# Patient Record
Sex: Female | Born: 1950
Health system: Southern US, Community
[De-identification: ages and names within clinical notes are randomized; demographics above are authoritative.]

## PROBLEM LIST (undated history)

## (undated) DIAGNOSIS — J309 Allergic rhinitis, unspecified: Secondary | ICD-10-CM

## (undated) DIAGNOSIS — E876 Hypokalemia: Secondary | ICD-10-CM

## (undated) DIAGNOSIS — M25569 Pain in unspecified knee: Secondary | ICD-10-CM

## (undated) DIAGNOSIS — K642 Third degree hemorrhoids: Secondary | ICD-10-CM

## (undated) DIAGNOSIS — K589 Irritable bowel syndrome without diarrhea: Secondary | ICD-10-CM

## (undated) DIAGNOSIS — Z8049 Family history of malignant neoplasm of other genital organs: Secondary | ICD-10-CM

## (undated) DIAGNOSIS — M5000 Cervical disc disorder with myelopathy, unspecified cervical region: Secondary | ICD-10-CM

## (undated) DIAGNOSIS — B001 Herpesviral vesicular dermatitis: Secondary | ICD-10-CM

## (undated) DIAGNOSIS — M19049 Primary osteoarthritis, unspecified hand: Secondary | ICD-10-CM

## (undated) DIAGNOSIS — K929 Disease of digestive system, unspecified: Secondary | ICD-10-CM

## (undated) DIAGNOSIS — E039 Hypothyroidism, unspecified: Secondary | ICD-10-CM

## (undated) DIAGNOSIS — M7730 Calcaneal spur, unspecified foot: Secondary | ICD-10-CM

## (undated) DIAGNOSIS — E119 Type 2 diabetes mellitus without complications: Secondary | ICD-10-CM

## (undated) DIAGNOSIS — K660 Peritoneal adhesions (postprocedural) (postinfection): Secondary | ICD-10-CM

## (undated) DIAGNOSIS — Z79899 Other long term (current) drug therapy: Secondary | ICD-10-CM

## (undated) DIAGNOSIS — I10 Essential (primary) hypertension: Secondary | ICD-10-CM

## (undated) DIAGNOSIS — J449 Chronic obstructive pulmonary disease, unspecified: Secondary | ICD-10-CM

## (undated) DIAGNOSIS — Z800 Family history of malignant neoplasm of digestive organs: Secondary | ICD-10-CM

## (undated) DIAGNOSIS — K219 Gastro-esophageal reflux disease without esophagitis: Secondary | ICD-10-CM

## (undated) DIAGNOSIS — M479 Spondylosis, unspecified: Secondary | ICD-10-CM

## (undated) DIAGNOSIS — K601 Chronic anal fissure: Secondary | ICD-10-CM

## (undated) DIAGNOSIS — M7138 Other bursal cyst, other site: Secondary | ICD-10-CM

## (undated) DIAGNOSIS — K760 Fatty (change of) liver, not elsewhere classified: Secondary | ICD-10-CM

## (undated) DIAGNOSIS — Z801 Family history of malignant neoplasm of trachea, bronchus and lung: Secondary | ICD-10-CM

## (undated) DIAGNOSIS — E559 Vitamin D deficiency, unspecified: Secondary | ICD-10-CM

## (undated) DIAGNOSIS — F319 Bipolar disorder, unspecified: Secondary | ICD-10-CM

## (undated) DIAGNOSIS — E669 Obesity, unspecified: Secondary | ICD-10-CM

## (undated) DIAGNOSIS — D126 Benign neoplasm of colon, unspecified: Secondary | ICD-10-CM

## (undated) DIAGNOSIS — G5600 Carpal tunnel syndrome, unspecified upper limb: Secondary | ICD-10-CM

## (undated) DIAGNOSIS — I2510 Atherosclerotic heart disease of native coronary artery without angina pectoris: Secondary | ICD-10-CM

## (undated) DIAGNOSIS — E7800 Pure hypercholesterolemia, unspecified: Secondary | ICD-10-CM

## (undated) DIAGNOSIS — R7303 Prediabetes: Secondary | ICD-10-CM

## (undated) DIAGNOSIS — G2581 Restless legs syndrome: Secondary | ICD-10-CM

## (undated) DIAGNOSIS — M797 Fibromyalgia: Secondary | ICD-10-CM

## (undated) DIAGNOSIS — R911 Solitary pulmonary nodule: Secondary | ICD-10-CM

## (undated) DIAGNOSIS — M169 Osteoarthritis of hip, unspecified: Secondary | ICD-10-CM

## (undated) DIAGNOSIS — G894 Chronic pain syndrome: Secondary | ICD-10-CM

## (undated) DIAGNOSIS — N393 Stress incontinence (female) (male): Secondary | ICD-10-CM

## (undated) DIAGNOSIS — L508 Other urticaria: Secondary | ICD-10-CM

## (undated) DIAGNOSIS — H269 Unspecified cataract: Secondary | ICD-10-CM

## (undated) DIAGNOSIS — J189 Pneumonia, unspecified organism: Secondary | ICD-10-CM

## (undated) DIAGNOSIS — E279 Disorder of adrenal gland, unspecified: Secondary | ICD-10-CM

## (undated) DIAGNOSIS — F419 Anxiety disorder, unspecified: Secondary | ICD-10-CM

## (undated) DIAGNOSIS — L719 Rosacea, unspecified: Secondary | ICD-10-CM

## (undated) DIAGNOSIS — Z803 Family history of malignant neoplasm of breast: Secondary | ICD-10-CM

## (undated) DIAGNOSIS — E781 Pure hyperglyceridemia: Secondary | ICD-10-CM

## (undated) DIAGNOSIS — Z8614 Personal history of Methicillin resistant Staphylococcus aureus infection: Secondary | ICD-10-CM

## (undated) DIAGNOSIS — K649 Unspecified hemorrhoids: Secondary | ICD-10-CM

## (undated) DIAGNOSIS — D649 Anemia, unspecified: Secondary | ICD-10-CM

## (undated) DIAGNOSIS — Z5689 Other problems related to employment: Secondary | ICD-10-CM

## (undated) DIAGNOSIS — Z8481 Family history of carrier of genetic disease: Secondary | ICD-10-CM

## (undated) DIAGNOSIS — E739 Lactose intolerance, unspecified: Secondary | ICD-10-CM

## (undated) DIAGNOSIS — M65312 Trigger thumb, left thumb: Secondary | ICD-10-CM

## (undated) DIAGNOSIS — J439 Emphysema, unspecified: Secondary | ICD-10-CM

## (undated) DIAGNOSIS — F339 Major depressive disorder, recurrent, unspecified: Secondary | ICD-10-CM

## (undated) DIAGNOSIS — G4761 Periodic limb movement disorder: Secondary | ICD-10-CM

## (undated) DIAGNOSIS — T7840XA Allergy, unspecified, initial encounter: Secondary | ICD-10-CM

## (undated) DIAGNOSIS — K644 Residual hemorrhoidal skin tags: Secondary | ICD-10-CM

## (undated) HISTORY — DX: Herpesviral vesicular dermatitis: B00.1

## (undated) HISTORY — PX: CARDIAC CATHETERIZATION: SHX172

## (undated) HISTORY — DX: Morbid (severe) obesity due to excess calories: E66.01

## (undated) HISTORY — DX: Fatty (change of) liver, not elsewhere classified: K76.0

## (undated) HISTORY — DX: Type 2 diabetes mellitus without complications: E11.9

## (undated) HISTORY — DX: Hypothyroidism, unspecified: E03.9

## (undated) HISTORY — DX: Restless legs syndrome: G25.81

## (undated) HISTORY — DX: Residual hemorrhoidal skin tags: K64.4

## (undated) HISTORY — PX: LAPAROSCOPY FOR ECTOPIC PREGNANCY: SUR765

## (undated) HISTORY — DX: Pain in unspecified knee: M25.569

## (undated) HISTORY — DX: Peritoneal adhesions (postprocedural) (postinfection): K66.0

## (undated) HISTORY — DX: Allergy, unspecified, initial encounter: T78.40XA

## (undated) HISTORY — PX: EXPLORATORY LAPAROTOMY: SUR591

## (undated) HISTORY — DX: Essential (primary) hypertension: I10

## (undated) HISTORY — DX: Chronic obstructive pulmonary disease, unspecified: J44.9

## (undated) HISTORY — DX: Gastro-esophageal reflux disease without esophagitis: K21.9

## (undated) HISTORY — PX: FACIAL LACERATIONS REPAIR: SHX1571

## (undated) HISTORY — DX: Pure hyperglyceridemia: E78.1

## (undated) HISTORY — DX: Fibromyalgia: M79.7

## (undated) HISTORY — DX: Major depressive disorder, recurrent, unspecified: F33.9

## (undated) HISTORY — DX: Third degree hemorrhoids: K64.2

## (undated) HISTORY — DX: Other urticaria: L50.8

## (undated) HISTORY — DX: Personal history of Methicillin resistant Staphylococcus aureus infection: Z86.14

## (undated) HISTORY — DX: Prediabetes: R73.03

## (undated) HISTORY — DX: Periodic limb movement disorder: G47.61

## (undated) HISTORY — DX: Disease of digestive system, unspecified: K92.9

## (undated) HISTORY — PX: ESOPHAGOGASTRODUODENOSCOPY: SHX1529

## (undated) HISTORY — DX: Primary osteoarthritis, unspecified hand: M19.049

## (undated) HISTORY — DX: Pure hypercholesterolemia, unspecified: E78.00

## (undated) HISTORY — DX: Irritable bowel syndrome without diarrhea: K58.9

## (undated) HISTORY — DX: Other bursal cyst, other site: M71.38

## (undated) HISTORY — DX: Disorder of adrenal gland, unspecified: E27.9

## (undated) HISTORY — DX: Obesity, unspecified: E66.9

## (undated) HISTORY — DX: Solitary pulmonary nodule: R91.1

## (undated) HISTORY — DX: Unspecified hemorrhoids: K64.9

## (undated) HISTORY — DX: Lactose intolerance, unspecified: E73.9

## (undated) HISTORY — DX: Family history of malignant neoplasm of digestive organs: Z80.0

## (undated) HISTORY — DX: Family history of malignant neoplasm of other genital organs: Z80.49

## (undated) HISTORY — DX: Hypokalemia: E87.6

## (undated) HISTORY — DX: Calcaneal spur, unspecified foot: M77.30

## (undated) HISTORY — DX: Allergic rhinitis, unspecified: J30.9

## (undated) HISTORY — DX: Chronic anal fissure: K60.1

## (undated) HISTORY — DX: Family history of malignant neoplasm of trachea, bronchus and lung: Z80.1

## (undated) HISTORY — PX: UPPER GASTROINTESTINAL ENDOSCOPY: SHX188

## (undated) HISTORY — DX: Emphysema, unspecified: J43.9

## (undated) HISTORY — DX: Carpal tunnel syndrome, unspecified upper limb: G56.00

## (undated) HISTORY — PX: APPENDECTOMY: SHX54

## (undated) HISTORY — PX: EXAM ANORECTAL W/ ULTRASOUND: SUR468

## (undated) HISTORY — DX: Family history of malignant neoplasm of breast: Z80.3

## (undated) HISTORY — DX: Benign neoplasm of colon, unspecified: D12.6

## (undated) HISTORY — DX: Rosacea, unspecified: L71.9

## (undated) HISTORY — DX: Cervical disc disorder with myelopathy, unspecified cervical region: M50.00

## (undated) HISTORY — DX: Chronic pain syndrome: G89.4

## (undated) HISTORY — DX: Osteoarthritis of hip, unspecified: M16.9

## (undated) HISTORY — DX: Family history of carrier of genetic disease: Z84.81

## (undated) HISTORY — DX: Vitamin D deficiency, unspecified: E55.9

## (undated) HISTORY — DX: Spondylosis, unspecified: M47.9

---
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
## 1990-11-06 HISTORY — PX: BUNIONECTOMY: SHX129

## 1996-11-06 HISTORY — PX: OTHER SURGICAL HISTORY: SHX169

## 1996-11-06 HISTORY — PX: CYSTOCELE REPAIR: SHX163

## 1996-11-06 HISTORY — PX: RECTOCELE REPAIR: SHX761

## 1998-05-26 ENCOUNTER — Ambulatory Visit (HOSPITAL_COMMUNITY): Admission: RE | Admit: 1998-05-26 | Discharge: 1998-05-26 | Payer: Self-pay | Admitting: Gastroenterology

## 1998-06-08 ENCOUNTER — Other Ambulatory Visit: Admission: RE | Admit: 1998-06-08 | Discharge: 1998-06-08 | Payer: Self-pay | Admitting: Obstetrics and Gynecology

## 1999-07-14 ENCOUNTER — Encounter: Admission: RE | Admit: 1999-07-14 | Discharge: 1999-07-14 | Payer: Self-pay | Admitting: Family Medicine

## 1999-07-27 ENCOUNTER — Encounter: Admission: RE | Admit: 1999-07-27 | Discharge: 1999-07-27 | Payer: Self-pay | Admitting: Family Medicine

## 1999-08-01 ENCOUNTER — Encounter: Admission: RE | Admit: 1999-08-01 | Discharge: 1999-08-01 | Payer: Self-pay | Admitting: Family Medicine

## 1999-11-17 ENCOUNTER — Encounter: Admission: RE | Admit: 1999-11-17 | Discharge: 1999-11-17 | Payer: Self-pay | Admitting: Family Medicine

## 1999-11-25 ENCOUNTER — Encounter: Admission: RE | Admit: 1999-11-25 | Discharge: 1999-11-25 | Payer: Self-pay | Admitting: Family Medicine

## 2000-01-31 ENCOUNTER — Other Ambulatory Visit: Admission: RE | Admit: 2000-01-31 | Discharge: 2000-01-31 | Payer: Self-pay | Admitting: Obstetrics and Gynecology

## 2000-03-16 ENCOUNTER — Encounter: Admission: RE | Admit: 2000-03-16 | Discharge: 2000-03-16 | Payer: Self-pay | Admitting: Family Medicine

## 2000-05-25 ENCOUNTER — Encounter: Admission: RE | Admit: 2000-05-25 | Discharge: 2000-05-25 | Payer: Self-pay | Admitting: Family Medicine

## 2000-05-28 ENCOUNTER — Encounter: Admission: RE | Admit: 2000-05-28 | Discharge: 2000-05-28 | Payer: Self-pay | Admitting: *Deleted

## 2000-05-28 ENCOUNTER — Encounter: Admission: RE | Admit: 2000-05-28 | Discharge: 2000-05-28 | Payer: Self-pay | Admitting: Family Medicine

## 2000-06-05 ENCOUNTER — Encounter: Admission: RE | Admit: 2000-06-05 | Discharge: 2000-06-05 | Payer: Self-pay | Admitting: Family Medicine

## 2000-09-04 ENCOUNTER — Encounter: Admission: RE | Admit: 2000-09-04 | Discharge: 2000-09-04 | Payer: Self-pay | Admitting: Family Medicine

## 2000-09-20 ENCOUNTER — Encounter: Admission: RE | Admit: 2000-09-20 | Discharge: 2000-09-20 | Payer: Self-pay | Admitting: Family Medicine

## 2001-03-19 ENCOUNTER — Other Ambulatory Visit: Admission: RE | Admit: 2001-03-19 | Discharge: 2001-03-19 | Payer: Self-pay | Admitting: Obstetrics and Gynecology

## 2001-03-28 ENCOUNTER — Encounter: Admission: RE | Admit: 2001-03-28 | Discharge: 2001-03-28 | Payer: Self-pay | Admitting: Family Medicine

## 2001-04-03 ENCOUNTER — Encounter: Admission: RE | Admit: 2001-04-03 | Discharge: 2001-04-03 | Payer: Self-pay | Admitting: Family Medicine

## 2002-03-23 ENCOUNTER — Inpatient Hospital Stay (HOSPITAL_COMMUNITY): Admission: EM | Admit: 2002-03-23 | Discharge: 2002-03-29 | Payer: Self-pay | Admitting: Psychiatry

## 2002-04-01 ENCOUNTER — Other Ambulatory Visit (HOSPITAL_COMMUNITY): Admission: RE | Admit: 2002-04-01 | Discharge: 2002-04-10 | Payer: Self-pay | Admitting: *Deleted

## 2002-04-03 ENCOUNTER — Encounter: Admission: RE | Admit: 2002-04-03 | Discharge: 2002-04-03 | Payer: Self-pay | Admitting: Family Medicine

## 2002-04-03 ENCOUNTER — Ambulatory Visit (HOSPITAL_COMMUNITY): Admission: RE | Admit: 2002-04-03 | Discharge: 2002-04-03 | Payer: Self-pay | Admitting: Family Medicine

## 2002-04-09 ENCOUNTER — Encounter: Admission: RE | Admit: 2002-04-09 | Discharge: 2002-04-09 | Payer: Self-pay | Admitting: Family Medicine

## 2002-04-24 ENCOUNTER — Encounter: Admission: RE | Admit: 2002-04-24 | Discharge: 2002-04-24 | Payer: Self-pay | Admitting: Family Medicine

## 2002-04-30 ENCOUNTER — Encounter: Admission: RE | Admit: 2002-04-30 | Discharge: 2002-04-30 | Payer: Self-pay | Admitting: Family Medicine

## 2002-05-08 ENCOUNTER — Encounter: Admission: RE | Admit: 2002-05-08 | Discharge: 2002-05-08 | Payer: Self-pay | Admitting: Family Medicine

## 2002-06-26 ENCOUNTER — Encounter: Admission: RE | Admit: 2002-06-26 | Discharge: 2002-06-26 | Payer: Self-pay | Admitting: Family Medicine

## 2002-09-11 ENCOUNTER — Encounter: Admission: RE | Admit: 2002-09-11 | Discharge: 2002-09-11 | Payer: Self-pay | Admitting: Family Medicine

## 2002-09-12 ENCOUNTER — Encounter: Admission: RE | Admit: 2002-09-12 | Discharge: 2002-09-12 | Payer: Self-pay | Admitting: Sports Medicine

## 2002-09-12 ENCOUNTER — Encounter: Payer: Self-pay | Admitting: Sports Medicine

## 2002-09-18 ENCOUNTER — Encounter: Admission: RE | Admit: 2002-09-18 | Discharge: 2002-09-18 | Payer: Self-pay | Admitting: Family Medicine

## 2002-10-06 ENCOUNTER — Encounter: Admission: RE | Admit: 2002-10-06 | Discharge: 2002-10-06 | Payer: Self-pay | Admitting: Family Medicine

## 2002-11-06 HISTORY — PX: COLONOSCOPY: SHX174

## 2003-01-07 ENCOUNTER — Ambulatory Visit (HOSPITAL_COMMUNITY): Admission: RE | Admit: 2003-01-07 | Discharge: 2003-01-07 | Payer: Self-pay | Admitting: Gastroenterology

## 2003-05-05 ENCOUNTER — Encounter: Admission: RE | Admit: 2003-05-05 | Discharge: 2003-05-05 | Payer: Self-pay | Admitting: Family Medicine

## 2003-07-02 ENCOUNTER — Encounter: Admission: RE | Admit: 2003-07-02 | Discharge: 2003-07-02 | Payer: Self-pay | Admitting: Family Medicine

## 2003-07-16 ENCOUNTER — Encounter: Admission: RE | Admit: 2003-07-16 | Discharge: 2003-07-16 | Payer: Self-pay | Admitting: Family Medicine

## 2003-07-24 ENCOUNTER — Encounter: Payer: Self-pay | Admitting: Emergency Medicine

## 2003-07-24 ENCOUNTER — Emergency Department (HOSPITAL_COMMUNITY): Admission: EM | Admit: 2003-07-24 | Discharge: 2003-07-24 | Payer: Self-pay | Admitting: Emergency Medicine

## 2003-07-28 ENCOUNTER — Encounter: Admission: RE | Admit: 2003-07-28 | Discharge: 2003-07-28 | Payer: Self-pay | Admitting: Family Medicine

## 2003-09-18 ENCOUNTER — Encounter: Admission: RE | Admit: 2003-09-18 | Discharge: 2003-09-18 | Payer: Self-pay | Admitting: Family Medicine

## 2003-10-05 ENCOUNTER — Encounter: Admission: RE | Admit: 2003-10-05 | Discharge: 2003-10-05 | Payer: Self-pay | Admitting: Family Medicine

## 2003-10-07 ENCOUNTER — Encounter: Admission: RE | Admit: 2003-10-07 | Discharge: 2003-10-07 | Payer: Self-pay | Admitting: Sports Medicine

## 2004-02-01 ENCOUNTER — Encounter: Admission: RE | Admit: 2004-02-01 | Discharge: 2004-02-01 | Payer: Self-pay | Admitting: Family Medicine

## 2004-05-23 ENCOUNTER — Encounter: Admission: RE | Admit: 2004-05-23 | Discharge: 2004-05-23 | Payer: Self-pay | Admitting: Family Medicine

## 2004-08-15 ENCOUNTER — Ambulatory Visit: Payer: Self-pay | Admitting: Family Medicine

## 2004-09-19 ENCOUNTER — Ambulatory Visit: Payer: Self-pay | Admitting: Family Medicine

## 2004-10-02 ENCOUNTER — Emergency Department (HOSPITAL_COMMUNITY): Admission: EM | Admit: 2004-10-02 | Discharge: 2004-10-02 | Payer: Self-pay | Admitting: Family Medicine

## 2004-12-02 ENCOUNTER — Ambulatory Visit: Payer: Self-pay | Admitting: Family Medicine

## 2005-02-27 ENCOUNTER — Ambulatory Visit: Payer: Self-pay | Admitting: Family Medicine

## 2005-03-27 ENCOUNTER — Ambulatory Visit: Payer: Self-pay | Admitting: Family Medicine

## 2005-08-14 ENCOUNTER — Ambulatory Visit: Payer: Self-pay | Admitting: Family Medicine

## 2005-09-05 ENCOUNTER — Ambulatory Visit: Payer: Self-pay | Admitting: Family Medicine

## 2005-09-07 ENCOUNTER — Ambulatory Visit: Payer: Self-pay | Admitting: Family Medicine

## 2005-09-12 ENCOUNTER — Encounter: Admission: RE | Admit: 2005-09-12 | Discharge: 2005-09-12 | Payer: Self-pay | Admitting: Family Medicine

## 2005-09-14 ENCOUNTER — Ambulatory Visit: Payer: Self-pay | Admitting: Family Medicine

## 2005-10-16 ENCOUNTER — Ambulatory Visit: Payer: Self-pay | Admitting: Family Medicine

## 2005-10-23 ENCOUNTER — Ambulatory Visit: Payer: Self-pay | Admitting: Family Medicine

## 2005-12-04 ENCOUNTER — Ambulatory Visit: Payer: Self-pay | Admitting: Family Medicine

## 2005-12-12 ENCOUNTER — Encounter: Admission: RE | Admit: 2005-12-12 | Discharge: 2006-03-12 | Payer: Self-pay | Admitting: Family Medicine

## 2005-12-28 ENCOUNTER — Ambulatory Visit: Payer: Self-pay | Admitting: Family Medicine

## 2006-01-02 ENCOUNTER — Encounter: Admission: RE | Admit: 2006-01-02 | Discharge: 2006-01-02 | Payer: Self-pay | Admitting: Family Medicine

## 2006-01-29 ENCOUNTER — Ambulatory Visit: Payer: Self-pay | Admitting: Family Medicine

## 2006-02-19 ENCOUNTER — Ambulatory Visit: Payer: Self-pay | Admitting: Family Medicine

## 2006-03-15 ENCOUNTER — Ambulatory Visit: Payer: Self-pay | Admitting: Family Medicine

## 2006-03-20 ENCOUNTER — Ambulatory Visit: Payer: Self-pay | Admitting: Family Medicine

## 2006-05-18 ENCOUNTER — Ambulatory Visit: Payer: Self-pay | Admitting: Family Medicine

## 2006-05-21 ENCOUNTER — Ambulatory Visit: Payer: Self-pay | Admitting: Family Medicine

## 2006-06-04 ENCOUNTER — Ambulatory Visit: Payer: Self-pay | Admitting: Family Medicine

## 2006-06-28 ENCOUNTER — Ambulatory Visit: Payer: Self-pay | Admitting: Family Medicine

## 2006-06-28 LAB — CONVERTED CEMR LAB
Cholesterol: 191 mg/dL
HDL: 37 mg/dL
Triglycerides: 485 mg/dL

## 2006-07-31 ENCOUNTER — Emergency Department (HOSPITAL_COMMUNITY): Admission: EM | Admit: 2006-07-31 | Discharge: 2006-07-31 | Payer: Self-pay | Admitting: Emergency Medicine

## 2006-08-06 ENCOUNTER — Ambulatory Visit: Payer: Self-pay | Admitting: Family Medicine

## 2006-08-16 ENCOUNTER — Ambulatory Visit: Payer: Self-pay | Admitting: Family Medicine

## 2006-08-24 ENCOUNTER — Ambulatory Visit: Payer: Self-pay | Admitting: Family Medicine

## 2006-08-30 ENCOUNTER — Ambulatory Visit: Payer: Self-pay | Admitting: Family Medicine

## 2006-09-06 ENCOUNTER — Ambulatory Visit: Payer: Self-pay | Admitting: Family Medicine

## 2006-09-10 ENCOUNTER — Ambulatory Visit: Payer: Self-pay | Admitting: Family Medicine

## 2006-09-17 ENCOUNTER — Ambulatory Visit: Payer: Self-pay | Admitting: Family Medicine

## 2006-10-05 ENCOUNTER — Ambulatory Visit: Payer: Self-pay | Admitting: Family Medicine

## 2006-10-08 ENCOUNTER — Ambulatory Visit: Payer: Self-pay | Admitting: Family Medicine

## 2006-11-06 HISTORY — PX: INJECTION HIP INTRA ARTICULAR: SHX2445

## 2006-11-19 ENCOUNTER — Ambulatory Visit: Payer: Self-pay | Admitting: Family Medicine

## 2006-11-19 LAB — CONVERTED CEMR LAB: TSH: 2.489 microintl units/mL (ref 0.350–5.50)

## 2007-01-03 DIAGNOSIS — K219 Gastro-esophageal reflux disease without esophagitis: Secondary | ICD-10-CM

## 2007-01-03 DIAGNOSIS — M5000 Cervical disc disorder with myelopathy, unspecified cervical region: Secondary | ICD-10-CM | POA: Insufficient documentation

## 2007-01-03 DIAGNOSIS — M479 Spondylosis, unspecified: Secondary | ICD-10-CM | POA: Insufficient documentation

## 2007-01-03 DIAGNOSIS — IMO0002 Reserved for concepts with insufficient information to code with codable children: Secondary | ICD-10-CM | POA: Insufficient documentation

## 2007-01-03 DIAGNOSIS — F339 Major depressive disorder, recurrent, unspecified: Secondary | ICD-10-CM

## 2007-01-03 DIAGNOSIS — F3181 Bipolar II disorder: Secondary | ICD-10-CM | POA: Insufficient documentation

## 2007-01-03 DIAGNOSIS — J4489 Other specified chronic obstructive pulmonary disease: Secondary | ICD-10-CM

## 2007-01-03 DIAGNOSIS — K589 Irritable bowel syndrome without diarrhea: Secondary | ICD-10-CM | POA: Insufficient documentation

## 2007-01-03 DIAGNOSIS — J309 Allergic rhinitis, unspecified: Secondary | ICD-10-CM

## 2007-01-03 DIAGNOSIS — J449 Chronic obstructive pulmonary disease, unspecified: Secondary | ICD-10-CM

## 2007-01-03 DIAGNOSIS — K649 Unspecified hemorrhoids: Secondary | ICD-10-CM

## 2007-01-03 DIAGNOSIS — I152 Hypertension secondary to endocrine disorders: Secondary | ICD-10-CM | POA: Insufficient documentation

## 2007-01-03 DIAGNOSIS — I10 Essential (primary) hypertension: Secondary | ICD-10-CM

## 2007-01-03 DIAGNOSIS — M25569 Pain in unspecified knee: Secondary | ICD-10-CM | POA: Insufficient documentation

## 2007-01-03 HISTORY — DX: Chronic obstructive pulmonary disease, unspecified: J44.9

## 2007-01-03 HISTORY — DX: Unspecified hemorrhoids: K64.9

## 2007-01-03 HISTORY — DX: Pain in unspecified knee: M25.569

## 2007-01-03 HISTORY — DX: Other specified chronic obstructive pulmonary disease: J44.89

## 2007-01-03 HISTORY — DX: Essential (primary) hypertension: I10

## 2007-01-03 HISTORY — DX: Major depressive disorder, recurrent, unspecified: F33.9

## 2007-01-03 HISTORY — DX: Cervical disc disorder with myelopathy, unspecified cervical region: M50.00

## 2007-01-03 HISTORY — DX: Gastro-esophageal reflux disease without esophagitis: K21.9

## 2007-01-03 HISTORY — DX: Allergic rhinitis, unspecified: J30.9

## 2007-01-03 HISTORY — DX: Spondylosis, unspecified: M47.9

## 2007-01-03 HISTORY — DX: Irritable bowel syndrome, unspecified: K58.9

## 2007-01-07 ENCOUNTER — Ambulatory Visit: Payer: Self-pay | Admitting: Family Medicine

## 2007-02-14 ENCOUNTER — Ambulatory Visit: Payer: Self-pay | Admitting: Family Medicine

## 2007-02-14 DIAGNOSIS — G5600 Carpal tunnel syndrome, unspecified upper limb: Secondary | ICD-10-CM | POA: Insufficient documentation

## 2007-02-14 HISTORY — DX: Carpal tunnel syndrome, unspecified upper limb: G56.00

## 2007-02-15 DIAGNOSIS — E039 Hypothyroidism, unspecified: Secondary | ICD-10-CM

## 2007-02-15 HISTORY — DX: Hypothyroidism, unspecified: E03.9

## 2007-03-04 ENCOUNTER — Ambulatory Visit: Payer: Self-pay | Admitting: Family Medicine

## 2007-04-19 ENCOUNTER — Telehealth: Payer: Self-pay | Admitting: *Deleted

## 2007-04-22 ENCOUNTER — Encounter: Admission: RE | Admit: 2007-04-22 | Discharge: 2007-04-22 | Payer: Self-pay | Admitting: Sports Medicine

## 2007-04-22 ENCOUNTER — Ambulatory Visit: Payer: Self-pay | Admitting: Family Medicine

## 2007-04-25 ENCOUNTER — Telehealth: Payer: Self-pay | Admitting: Family Medicine

## 2007-05-06 ENCOUNTER — Ambulatory Visit: Payer: Self-pay | Admitting: Family Medicine

## 2007-05-06 LAB — CONVERTED CEMR LAB
Bilirubin Urine: NEGATIVE
Blood in Urine, dipstick: NEGATIVE
Glucose, Urine, Semiquant: NEGATIVE
Ketones, urine, test strip: NEGATIVE
Nitrite: NEGATIVE
Protein, U semiquant: NEGATIVE
Specific Gravity, Urine: <1.005
Urobilinogen, UA: 0.2
WBC Urine, dipstick: NEGATIVE
pH: 6.5

## 2007-05-07 ENCOUNTER — Ambulatory Visit: Payer: Self-pay | Admitting: Family Medicine

## 2007-05-07 ENCOUNTER — Encounter: Payer: Self-pay | Admitting: Family Medicine

## 2007-05-08 LAB — CONVERTED CEMR LAB
BUN: 18 mg/dL (ref 6–23)
CO2: 30 meq/L (ref 19–32)
Calcium: 9.5 mg/dL (ref 8.4–10.5)
Chloride: 98 meq/L (ref 96–112)
Creatinine, Ser: 0.68 mg/dL (ref 0.40–1.20)
Glucose, Bld: 90 mg/dL (ref 70–99)
Potassium: 3.7 meq/L (ref 3.5–5.3)
Sodium: 135 meq/L (ref 135–145)

## 2007-05-11 ENCOUNTER — Encounter: Admission: RE | Admit: 2007-05-11 | Discharge: 2007-05-11 | Payer: Self-pay | Admitting: Family Medicine

## 2007-05-13 ENCOUNTER — Telehealth: Payer: Self-pay | Admitting: Family Medicine

## 2007-05-20 ENCOUNTER — Telehealth: Payer: Self-pay | Admitting: Family Medicine

## 2007-05-23 ENCOUNTER — Ambulatory Visit: Payer: Self-pay | Admitting: Family Medicine

## 2007-05-23 DIAGNOSIS — M169 Osteoarthritis of hip, unspecified: Secondary | ICD-10-CM

## 2007-05-23 DIAGNOSIS — M160 Bilateral primary osteoarthritis of hip: Secondary | ICD-10-CM | POA: Insufficient documentation

## 2007-05-23 HISTORY — DX: Osteoarthritis of hip, unspecified: M16.9

## 2007-05-23 HISTORY — DX: Bilateral primary osteoarthritis of hip: M16.0

## 2007-05-24 ENCOUNTER — Encounter: Payer: Self-pay | Admitting: Family Medicine

## 2007-06-10 ENCOUNTER — Encounter: Payer: Self-pay | Admitting: Family Medicine

## 2007-06-10 ENCOUNTER — Telehealth (INDEPENDENT_AMBULATORY_CARE_PROVIDER_SITE_OTHER): Payer: Self-pay | Admitting: Family Medicine

## 2007-06-11 ENCOUNTER — Ambulatory Visit: Payer: Self-pay | Admitting: Family Medicine

## 2007-06-13 ENCOUNTER — Telehealth: Payer: Self-pay | Admitting: *Deleted

## 2007-06-13 ENCOUNTER — Ambulatory Visit: Payer: Self-pay | Admitting: Family Medicine

## 2007-06-13 LAB — CONVERTED CEMR LAB
ALT: 14 units/L (ref 0–35)
AST: 13 units/L (ref 0–37)
Albumin: 3.8 g/dL (ref 3.5–5.2)
Alkaline Phosphatase: 63 units/L (ref 39–117)
BUN: 10 mg/dL (ref 6–23)
Basophils Absolute: 0 10*3/uL (ref 0.0–0.1)
Basophils Relative: 0 % (ref 0–1)
Bilirubin Urine: NEGATIVE
CO2: 27 meq/L (ref 19–32)
Calcium: 8.8 mg/dL (ref 8.4–10.5)
Chloride: 93 meq/L — ABNORMAL LOW (ref 96–112)
Creatinine, Ser: 0.54 mg/dL (ref 0.40–1.20)
Eosinophils Absolute: 0.2 10*3/uL (ref 0.0–0.7)
Eosinophils Relative: 1 % (ref 0–5)
Glucose, Bld: 95 mg/dL (ref 70–99)
Glucose, Urine, Semiquant: NEGATIVE
HCT: 35.9 % — ABNORMAL LOW (ref 36.0–46.0)
Hemoglobin: 12.3 g/dL (ref 12.0–15.0)
Ketones, urine, test strip: NEGATIVE
Lymphocytes Relative: 16 % (ref 12–46)
Lymphs Abs: 2 10*3/uL (ref 0.7–3.3)
MCHC: 34.3 g/dL (ref 30.0–36.0)
MCV: 90.7 fL (ref 78.0–100.0)
Monocytes Absolute: 1.7 10*3/uL — ABNORMAL HIGH (ref 0.2–0.7)
Monocytes Relative: 14 % — ABNORMAL HIGH (ref 3–11)
Neutro Abs: 8.4 10*3/uL — ABNORMAL HIGH (ref 1.7–7.7)
Neutrophils Relative %: 69 % (ref 43–77)
Nitrite: NEGATIVE
Platelets: 242 10*3/uL (ref 150–400)
Potassium: 3.5 meq/L (ref 3.5–5.3)
Protein, U semiquant: NEGATIVE
RBC: 3.96 M/uL (ref 3.87–5.11)
RDW: 13.3 % (ref 11.5–14.0)
Sed Rate: 95 mm/hr — ABNORMAL HIGH (ref 0–22)
Sodium: 133 meq/L — ABNORMAL LOW (ref 135–145)
Specific Gravity, Urine: 1.01
Total Bilirubin: 0.5 mg/dL (ref 0.3–1.2)
Total Protein: 6.9 g/dL (ref 6.0–8.3)
Urobilinogen, UA: 0.2
WBC Urine, dipstick: NEGATIVE
WBC: 12.3 10*3/uL — ABNORMAL HIGH (ref 4.0–10.5)
pH: 7

## 2007-06-14 ENCOUNTER — Encounter: Payer: Self-pay | Admitting: Family Medicine

## 2007-06-19 ENCOUNTER — Encounter: Admission: RE | Admit: 2007-06-19 | Discharge: 2007-06-19 | Payer: Self-pay | Admitting: Orthopedic Surgery

## 2007-06-24 ENCOUNTER — Ambulatory Visit: Payer: Self-pay | Admitting: Family Medicine

## 2007-06-25 LAB — CONVERTED CEMR LAB
HCT: 41.3 % (ref 36.0–46.0)
Hemoglobin: 13.4 g/dL (ref 12.0–15.0)
MCHC: 32.4 g/dL (ref 30.0–36.0)
MCV: 96 fL (ref 78.0–100.0)
Platelets: 512 10*3/uL — ABNORMAL HIGH (ref 150–400)
RBC: 4.3 M/uL (ref 3.87–5.11)
RDW: 14.5 % — ABNORMAL HIGH (ref 11.5–14.0)
WBC: 11.1 10*3/uL — ABNORMAL HIGH (ref 4.0–10.5)

## 2007-07-01 ENCOUNTER — Telehealth: Payer: Self-pay | Admitting: Family Medicine

## 2007-07-05 ENCOUNTER — Telehealth (INDEPENDENT_AMBULATORY_CARE_PROVIDER_SITE_OTHER): Payer: Self-pay | Admitting: *Deleted

## 2007-07-05 ENCOUNTER — Ambulatory Visit: Payer: Self-pay | Admitting: Family Medicine

## 2007-07-05 ENCOUNTER — Encounter: Payer: Self-pay | Admitting: Family Medicine

## 2007-07-12 ENCOUNTER — Telehealth: Payer: Self-pay | Admitting: Family Medicine

## 2007-07-15 ENCOUNTER — Ambulatory Visit: Payer: Self-pay | Admitting: Family Medicine

## 2007-07-15 DIAGNOSIS — M797 Fibromyalgia: Secondary | ICD-10-CM | POA: Insufficient documentation

## 2007-07-22 ENCOUNTER — Ambulatory Visit: Payer: Self-pay | Admitting: Family Medicine

## 2007-07-24 ENCOUNTER — Telehealth: Payer: Self-pay | Admitting: Family Medicine

## 2007-08-07 ENCOUNTER — Ambulatory Visit: Payer: Self-pay | Admitting: Family Medicine

## 2007-08-21 ENCOUNTER — Telehealth: Payer: Self-pay | Admitting: Family Medicine

## 2007-09-11 DIAGNOSIS — G2581 Restless legs syndrome: Secondary | ICD-10-CM | POA: Insufficient documentation

## 2007-09-11 HISTORY — DX: Restless legs syndrome: G25.81

## 2007-09-18 ENCOUNTER — Encounter (INDEPENDENT_AMBULATORY_CARE_PROVIDER_SITE_OTHER): Payer: Self-pay | Admitting: Family Medicine

## 2007-09-18 ENCOUNTER — Ambulatory Visit: Payer: Self-pay | Admitting: Family Medicine

## 2007-09-23 ENCOUNTER — Telehealth: Payer: Self-pay | Admitting: Family Medicine

## 2007-09-25 ENCOUNTER — Encounter: Payer: Self-pay | Admitting: Family Medicine

## 2007-09-30 ENCOUNTER — Ambulatory Visit: Payer: Self-pay | Admitting: Family Medicine

## 2007-09-30 DIAGNOSIS — E66813 Obesity, class 3: Secondary | ICD-10-CM | POA: Insufficient documentation

## 2007-09-30 DIAGNOSIS — E66811 Obesity, class 1: Secondary | ICD-10-CM

## 2007-09-30 DIAGNOSIS — E669 Obesity, unspecified: Secondary | ICD-10-CM

## 2007-09-30 HISTORY — DX: Obesity, class 3: E66.813

## 2007-09-30 HISTORY — DX: Morbid (severe) obesity due to excess calories: E66.01

## 2007-09-30 HISTORY — DX: Obesity, class 1: E66.811

## 2007-09-30 HISTORY — DX: Obesity, unspecified: E66.9

## 2007-10-01 DIAGNOSIS — M2170 Unequal limb length (acquired), unspecified site: Secondary | ICD-10-CM | POA: Insufficient documentation

## 2007-11-11 ENCOUNTER — Ambulatory Visit: Payer: Self-pay | Admitting: Family Medicine

## 2007-11-11 DIAGNOSIS — E781 Pure hyperglyceridemia: Secondary | ICD-10-CM | POA: Insufficient documentation

## 2007-11-11 HISTORY — DX: Pure hyperglyceridemia: E78.1

## 2007-11-28 ENCOUNTER — Ambulatory Visit: Payer: Self-pay | Admitting: Family Medicine

## 2007-12-02 ENCOUNTER — Telehealth: Payer: Self-pay | Admitting: Family Medicine

## 2007-12-02 ENCOUNTER — Ambulatory Visit: Payer: Self-pay | Admitting: Family Medicine

## 2007-12-06 ENCOUNTER — Ambulatory Visit: Payer: Self-pay | Admitting: Family Medicine

## 2007-12-09 ENCOUNTER — Ambulatory Visit: Payer: Self-pay | Admitting: Family Medicine

## 2007-12-10 ENCOUNTER — Telehealth: Payer: Self-pay | Admitting: Family Medicine

## 2007-12-20 ENCOUNTER — Telehealth: Payer: Self-pay | Admitting: *Deleted

## 2007-12-24 ENCOUNTER — Encounter: Payer: Self-pay | Admitting: Family Medicine

## 2007-12-25 ENCOUNTER — Telehealth: Payer: Self-pay | Admitting: *Deleted

## 2008-01-02 ENCOUNTER — Encounter: Payer: Self-pay | Admitting: Family Medicine

## 2008-01-23 DIAGNOSIS — D126 Benign neoplasm of colon, unspecified: Secondary | ICD-10-CM | POA: Insufficient documentation

## 2008-01-23 HISTORY — DX: Benign neoplasm of colon, unspecified: D12.6

## 2008-02-04 ENCOUNTER — Encounter: Payer: Self-pay | Admitting: Family Medicine

## 2008-02-24 ENCOUNTER — Telehealth: Payer: Self-pay | Admitting: *Deleted

## 2008-02-25 ENCOUNTER — Ambulatory Visit: Payer: Self-pay | Admitting: Family Medicine

## 2008-03-09 ENCOUNTER — Ambulatory Visit: Payer: Self-pay | Admitting: Family Medicine

## 2008-03-10 DIAGNOSIS — E739 Lactose intolerance, unspecified: Secondary | ICD-10-CM

## 2008-03-10 HISTORY — DX: Lactose intolerance, unspecified: E73.9

## 2008-04-26 ENCOUNTER — Ambulatory Visit: Payer: Self-pay | Admitting: *Deleted

## 2008-04-26 ENCOUNTER — Inpatient Hospital Stay (HOSPITAL_COMMUNITY): Admission: AD | Admit: 2008-04-26 | Discharge: 2008-05-05 | Payer: Self-pay | Admitting: *Deleted

## 2008-06-18 ENCOUNTER — Ambulatory Visit: Payer: Self-pay | Admitting: Family Medicine

## 2008-06-18 DIAGNOSIS — M19049 Primary osteoarthritis, unspecified hand: Secondary | ICD-10-CM

## 2008-06-18 DIAGNOSIS — K929 Disease of digestive system, unspecified: Secondary | ICD-10-CM

## 2008-06-18 HISTORY — DX: Primary osteoarthritis, unspecified hand: M19.049

## 2008-06-18 HISTORY — DX: Disease of digestive system, unspecified: K92.9

## 2008-06-24 ENCOUNTER — Telehealth: Payer: Self-pay | Admitting: *Deleted

## 2008-07-02 ENCOUNTER — Encounter: Payer: Self-pay | Admitting: Family Medicine

## 2008-07-02 ENCOUNTER — Ambulatory Visit: Payer: Self-pay | Admitting: Family Medicine

## 2008-08-10 ENCOUNTER — Encounter: Payer: Self-pay | Admitting: Family Medicine

## 2008-08-17 ENCOUNTER — Encounter: Payer: Self-pay | Admitting: Family Medicine

## 2008-08-17 ENCOUNTER — Ambulatory Visit: Payer: Self-pay | Admitting: Family Medicine

## 2008-09-03 ENCOUNTER — Ambulatory Visit: Payer: Self-pay | Admitting: Family Medicine

## 2008-09-10 ENCOUNTER — Ambulatory Visit: Payer: Self-pay | Admitting: Family Medicine

## 2008-10-08 ENCOUNTER — Ambulatory Visit: Payer: Self-pay | Admitting: Family Medicine

## 2008-10-08 LAB — CONVERTED CEMR LAB

## 2008-10-09 ENCOUNTER — Telehealth: Payer: Self-pay | Admitting: Family Medicine

## 2008-10-14 ENCOUNTER — Encounter: Payer: Self-pay | Admitting: Family Medicine

## 2008-10-20 ENCOUNTER — Ambulatory Visit (HOSPITAL_BASED_OUTPATIENT_CLINIC_OR_DEPARTMENT_OTHER): Admission: RE | Admit: 2008-10-20 | Discharge: 2008-10-20 | Payer: Self-pay | Admitting: Orthopedic Surgery

## 2008-10-28 ENCOUNTER — Encounter: Payer: Self-pay | Admitting: Family Medicine

## 2008-11-06 HISTORY — PX: CARPAL TUNNEL RELEASE: SHX101

## 2008-11-12 ENCOUNTER — Ambulatory Visit: Payer: Self-pay | Admitting: Family Medicine

## 2008-11-13 ENCOUNTER — Ambulatory Visit (HOSPITAL_BASED_OUTPATIENT_CLINIC_OR_DEPARTMENT_OTHER): Admission: RE | Admit: 2008-11-13 | Discharge: 2008-11-13 | Payer: Self-pay | Admitting: Orthopedic Surgery

## 2008-11-23 ENCOUNTER — Encounter: Payer: Self-pay | Admitting: Family Medicine

## 2008-12-24 ENCOUNTER — Ambulatory Visit: Payer: Self-pay | Admitting: Family Medicine

## 2008-12-24 DIAGNOSIS — G894 Chronic pain syndrome: Secondary | ICD-10-CM | POA: Insufficient documentation

## 2008-12-24 DIAGNOSIS — E1169 Type 2 diabetes mellitus with other specified complication: Secondary | ICD-10-CM | POA: Insufficient documentation

## 2008-12-24 DIAGNOSIS — E7800 Pure hypercholesterolemia, unspecified: Secondary | ICD-10-CM

## 2008-12-24 HISTORY — DX: Chronic pain syndrome: G89.4

## 2008-12-24 HISTORY — DX: Pure hypercholesterolemia, unspecified: E78.00

## 2008-12-24 LAB — CONVERTED CEMR LAB: Hgb A1c MFr Bld: 5.8 % — ABNORMAL HIGH

## 2008-12-25 ENCOUNTER — Telehealth: Payer: Self-pay | Admitting: Family Medicine

## 2008-12-25 DIAGNOSIS — R7303 Prediabetes: Secondary | ICD-10-CM

## 2008-12-25 HISTORY — DX: Prediabetes: R73.03

## 2008-12-25 LAB — CONVERTED CEMR LAB
Direct LDL: 108 mg/dL — ABNORMAL HIGH
TSH: 9.725 u[IU]/mL — ABNORMAL HIGH

## 2009-01-27 ENCOUNTER — Encounter: Payer: Self-pay | Admitting: Family Medicine

## 2009-01-28 ENCOUNTER — Ambulatory Visit: Payer: Self-pay | Admitting: Family Medicine

## 2009-01-29 ENCOUNTER — Telehealth: Payer: Self-pay | Admitting: Family Medicine

## 2009-03-21 ENCOUNTER — Emergency Department (HOSPITAL_COMMUNITY): Admission: EM | Admit: 2009-03-21 | Discharge: 2009-03-21 | Payer: Self-pay | Admitting: Emergency Medicine

## 2009-03-21 ENCOUNTER — Telehealth (INDEPENDENT_AMBULATORY_CARE_PROVIDER_SITE_OTHER): Payer: Self-pay | Admitting: Family Medicine

## 2009-04-01 ENCOUNTER — Telehealth: Payer: Self-pay | Admitting: Family Medicine

## 2009-05-06 ENCOUNTER — Ambulatory Visit: Payer: Self-pay | Admitting: Family Medicine

## 2009-05-06 LAB — CONVERTED CEMR LAB: TSH: 3.909 microintl units/mL (ref 0.350–4.500)

## 2009-05-07 ENCOUNTER — Telehealth: Payer: Self-pay | Admitting: Family Medicine

## 2009-05-13 ENCOUNTER — Encounter: Admission: RE | Admit: 2009-05-13 | Discharge: 2009-05-13 | Payer: Self-pay | Admitting: Family Medicine

## 2009-06-15 ENCOUNTER — Telehealth: Payer: Self-pay | Admitting: Family Medicine

## 2009-08-02 ENCOUNTER — Ambulatory Visit: Payer: Self-pay | Admitting: Family Medicine

## 2009-08-02 LAB — CONVERTED CEMR LAB: Hgb A1c MFr Bld: 6.1 % — ABNORMAL HIGH

## 2009-08-03 LAB — CONVERTED CEMR LAB
ALT: 26 units/L (ref 0–35)
AST: 23 units/L (ref 0–37)
Bilirubin, Direct: 0.1 mg/dL (ref 0.0–0.3)
Cholesterol: 202 mg/dL — ABNORMAL HIGH (ref 0–200)
Total CHOL/HDL Ratio: 4.3
Total Protein: 7.3 g/dL (ref 6.0–8.3)
Triglycerides: 287 mg/dL — ABNORMAL HIGH (ref <150)

## 2009-08-11 ENCOUNTER — Encounter: Payer: Self-pay | Admitting: Family Medicine

## 2009-11-06 HISTORY — PX: NM MYOVIEW LTD: HXRAD82

## 2009-11-08 ENCOUNTER — Ambulatory Visit: Payer: Self-pay | Admitting: Family Medicine

## 2009-11-10 ENCOUNTER — Telehealth: Payer: Self-pay | Admitting: Family Medicine

## 2009-11-10 DIAGNOSIS — Z87891 Personal history of nicotine dependence: Secondary | ICD-10-CM | POA: Insufficient documentation

## 2010-01-01 ENCOUNTER — Encounter: Payer: Self-pay | Admitting: Emergency Medicine

## 2010-01-01 ENCOUNTER — Encounter: Payer: Self-pay | Admitting: Family Medicine

## 2010-01-02 ENCOUNTER — Inpatient Hospital Stay (HOSPITAL_COMMUNITY): Admission: EM | Admit: 2010-01-02 | Discharge: 2010-01-02 | Payer: Self-pay | Admitting: Family Medicine

## 2010-01-02 ENCOUNTER — Ambulatory Visit: Payer: Self-pay | Admitting: Family Medicine

## 2010-01-05 ENCOUNTER — Ambulatory Visit: Payer: Self-pay | Admitting: Family Medicine

## 2010-01-11 ENCOUNTER — Encounter: Payer: Self-pay | Admitting: Family Medicine

## 2010-01-11 ENCOUNTER — Ambulatory Visit (HOSPITAL_COMMUNITY): Admission: RE | Admit: 2010-01-11 | Discharge: 2010-01-11 | Payer: Self-pay | Admitting: Family Medicine

## 2010-01-11 ENCOUNTER — Ambulatory Visit: Payer: Self-pay | Admitting: Family Medicine

## 2010-01-11 DIAGNOSIS — F411 Generalized anxiety disorder: Secondary | ICD-10-CM | POA: Insufficient documentation

## 2010-01-17 ENCOUNTER — Encounter: Payer: Self-pay | Admitting: Family Medicine

## 2010-01-19 ENCOUNTER — Encounter: Payer: Self-pay | Admitting: Family Medicine

## 2010-01-25 ENCOUNTER — Emergency Department (HOSPITAL_COMMUNITY): Admission: EM | Admit: 2010-01-25 | Discharge: 2010-01-25 | Payer: Self-pay | Admitting: Emergency Medicine

## 2010-01-25 LAB — CONVERTED CEMR LAB
Lymphocytes Relative: 15 %
Lymphs Abs: 1.9 10*3/uL
Monocytes Relative: 7 %
Neutro Abs: 9.5 10*3/uL — ABNORMAL HIGH
Neutrophils Relative %: 77 %
RBC: 4.02 M/uL
WBC: 12.3 10*3/uL

## 2010-01-26 ENCOUNTER — Ambulatory Visit: Payer: Self-pay | Admitting: Family Medicine

## 2010-01-26 ENCOUNTER — Inpatient Hospital Stay (HOSPITAL_COMMUNITY): Admission: AD | Admit: 2010-01-26 | Discharge: 2010-01-28 | Payer: Self-pay | Admitting: Interventional Cardiology

## 2010-01-26 ENCOUNTER — Telehealth: Payer: Self-pay | Admitting: Family Medicine

## 2010-01-26 LAB — CONVERTED CEMR LAB
AST: 32 units/L
Albumin: 4.4 g/dL
BUN: 11 mg/dL
Basophils Absolute: 0 10*3/uL
Calcium: 9.1 mg/dL
GFR calc non Af Amer: >60 mL/min
Glucose, Bld: 107 mg/dL
Hemoglobin: 12.8 g/dL
Lymphocytes Relative: 26 %
Monocytes Absolute: 1.2 10*3/uL
Neutro Abs: 6.6 10*3/uL
Potassium: 3.4 meq/L — AB
RDW: 13.1 %
TSH: 8.657 microintl units/mL — ABNORMAL HIGH
Total Bilirubin: 0.4 mg/dL

## 2010-01-27 ENCOUNTER — Encounter: Payer: Self-pay | Admitting: Family Medicine

## 2010-01-28 ENCOUNTER — Encounter (INDEPENDENT_AMBULATORY_CARE_PROVIDER_SITE_OTHER): Payer: Self-pay | Admitting: Interventional Cardiology

## 2010-01-28 LAB — CONVERTED CEMR LAB
CO2: 29 meq/L
Chloride: 105 meq/L
Glucose, Bld: 113 mg/dL — ABNORMAL HIGH
HCT: 29.6 %
MCV: 90.6 fL
RBC: 3.27 M/uL
Sodium: 140 meq/L
WBC: 8.1 10*3/uL

## 2010-01-31 ENCOUNTER — Ambulatory Visit: Payer: Self-pay | Admitting: Family Medicine

## 2010-03-03 ENCOUNTER — Ambulatory Visit: Payer: Self-pay | Admitting: Family Medicine

## 2010-03-03 DIAGNOSIS — E559 Vitamin D deficiency, unspecified: Secondary | ICD-10-CM | POA: Insufficient documentation

## 2010-03-03 HISTORY — DX: Vitamin D deficiency, unspecified: E55.9

## 2010-03-03 LAB — CONVERTED CEMR LAB: TSH: 7.191 microintl units/mL — ABNORMAL HIGH (ref 0.350–4.500)

## 2010-03-29 ENCOUNTER — Telehealth: Payer: Self-pay | Admitting: Family Medicine

## 2010-03-30 ENCOUNTER — Ambulatory Visit: Payer: Self-pay | Admitting: Family Medicine

## 2010-05-11 ENCOUNTER — Encounter: Payer: Self-pay | Admitting: Family Medicine

## 2010-05-13 ENCOUNTER — Telehealth: Payer: Self-pay | Admitting: Family Medicine

## 2010-05-19 ENCOUNTER — Encounter: Payer: Self-pay | Admitting: Family Medicine

## 2010-05-19 ENCOUNTER — Ambulatory Visit: Payer: Self-pay | Admitting: Family Medicine

## 2010-05-20 ENCOUNTER — Telehealth: Payer: Self-pay | Admitting: Family Medicine

## 2010-05-23 ENCOUNTER — Telehealth: Payer: Self-pay | Admitting: Family Medicine

## 2010-05-30 ENCOUNTER — Ambulatory Visit: Payer: Self-pay

## 2010-06-01 ENCOUNTER — Encounter: Payer: Self-pay | Admitting: Family Medicine

## 2010-06-22 ENCOUNTER — Encounter: Payer: Self-pay | Admitting: Family Medicine

## 2010-07-25 ENCOUNTER — Encounter: Payer: Self-pay | Admitting: *Deleted

## 2010-08-29 ENCOUNTER — Telehealth: Payer: Self-pay | Admitting: Family Medicine

## 2010-08-29 ENCOUNTER — Ambulatory Visit: Payer: Self-pay | Admitting: Family Medicine

## 2010-08-29 LAB — CONVERTED CEMR LAB: TSH: 0.814 microintl units/mL (ref 0.350–4.500)

## 2010-08-30 ENCOUNTER — Encounter: Payer: Self-pay | Admitting: Family Medicine

## 2010-09-19 ENCOUNTER — Telehealth: Payer: Self-pay | Admitting: *Deleted

## 2010-10-10 ENCOUNTER — Encounter: Admission: RE | Admit: 2010-10-10 | Discharge: 2010-10-10 | Payer: Self-pay | Admitting: Family Medicine

## 2010-10-10 ENCOUNTER — Ambulatory Visit: Payer: Self-pay | Admitting: Family Medicine

## 2010-10-25 ENCOUNTER — Encounter: Payer: Self-pay | Admitting: Family Medicine

## 2010-10-25 ENCOUNTER — Ambulatory Visit (HOSPITAL_BASED_OUTPATIENT_CLINIC_OR_DEPARTMENT_OTHER)
Admission: RE | Admit: 2010-10-25 | Discharge: 2010-10-25 | Payer: Self-pay | Source: Home / Self Care | Attending: Family Medicine | Admitting: Family Medicine

## 2010-11-08 ENCOUNTER — Telehealth (INDEPENDENT_AMBULATORY_CARE_PROVIDER_SITE_OTHER): Payer: Self-pay | Admitting: *Deleted

## 2010-11-10 ENCOUNTER — Telehealth: Payer: Self-pay | Admitting: *Deleted

## 2010-11-14 ENCOUNTER — Encounter: Payer: Self-pay | Admitting: Family Medicine

## 2010-11-22 ENCOUNTER — Telehealth: Payer: Self-pay | Admitting: Family Medicine

## 2010-11-27 ENCOUNTER — Encounter: Payer: Self-pay | Admitting: Family Medicine

## 2010-11-27 ENCOUNTER — Encounter: Payer: Self-pay | Admitting: Sports Medicine

## 2010-11-28 ENCOUNTER — Ambulatory Visit
Admission: RE | Admit: 2010-11-28 | Discharge: 2010-11-28 | Payer: Self-pay | Source: Home / Self Care | Attending: Pulmonary Disease | Admitting: Pulmonary Disease

## 2010-12-05 ENCOUNTER — Ambulatory Visit
Admission: RE | Admit: 2010-12-05 | Discharge: 2010-12-05 | Payer: Self-pay | Source: Home / Self Care | Attending: Family Medicine | Admitting: Family Medicine

## 2010-12-05 DIAGNOSIS — G4761 Periodic limb movement disorder: Secondary | ICD-10-CM | POA: Insufficient documentation

## 2010-12-05 HISTORY — DX: Periodic limb movement disorder: G47.61

## 2010-12-06 NOTE — Miscellaneous (Signed)
Summary: burning in chest  Clinical Lists Changes started last night. back again this am as of 20 minutes ago.. just ate soup & took compazine. feels better than 10 minutes ago. states this is what she felt the last time she went to ED thinking she had a heart attack. she was diagnosed with panic attacks. denies sweating, SOB, chest pressure or pain, neck,shoulder,arm or jaw or back pain. she is going to call her dtr now to get her car back & will be here at 1:30 for a work in. reviewed flags that she should call 911 for. she agreed & will be here 1:30.Golden Circle RN  January 11, 2010 12:10 PM

## 2010-12-06 NOTE — Assessment & Plan Note (Signed)
Summary: [name],[name]   Vital Signs:  Patient profile:   60 year old female Height:      64 inches Weight:      235 pounds BMI:     40.48 Temp:     97.8 degrees F oral Pulse rate:   81 / minute BP sitting:   140 / 85  (right arm) Cuff size:   large  Vitals Entered By: Garen Grams LPN (November 08, 2009 3:01 PM)  CC: f/u visit Is Patient Diabetic? No Pain Assessment Patient in pain? yes     Location: shoulders Intensity: 3   Primary Care Provider:  Lysbeth Dicola MD  CC:  f/u visit.  History of Present Illness: Subclinical Hypothyroidism Has been taking Levothyroxine , two tab daily    Depression Mood more upbeat.  Reports brighter affect when she is at work.  On non-work days she "lies around at home watching TV".  Dr Betti Cruz continues her Prozac at 40 mg daily last week.  She denies thoughts of self harm.     Chronic opiate treatment Adequate pain control and restless leg control. She is able to work  ~ 10 hours a week.  Feels like the oxycodone is most helpful in giving her pain relief to get out of bed and keep working. Taking MS Contin 15 mg two times a day Taking Oxycodone 5 mg tab, between 2-3 tabs a day. Constipation has been a problem.  She manages it with colace 2 tablets once a day  She is interested in trying to come off the MS Contin   ROS in HPI Medication listed and updated in medication list. Smoking status documented by nursing and reviewed. No alcohoL.  Abstinent from tobacco for 2 years today!  HYPERTENSION Disease Monitoring   Blood pressure range:not checking at home   Chest pain:none   Claudication:none  Medications   Compliance Medication listed and updated in medication list. Smoking status documented by nursing and reviewed.  Side effects   Lightheadedness:no   Urinary frequency:no   Edema:no  Prevention   Exercise:none. Stop her Gym membership b/c she was not going.   Diet pattern: Planning to go to [employer].      Habits & Providers  Alcohol-Tobacco-Diet     Alcohol drinks/day: 0     Tobacco Status: current  Exercise-Depression-Behavior     Have you felt down or hopeless? no     Have you felt little pleasure in things? no  Current Medications (verified): 1)  Allegra 180 Mg Tabs (Fexofenadine Hcl) .... Take 1 Tablet By Mouth Once A Day 2)  Altace 10 Mg Caps (Ramipril) .... Take 1 Capsule By Mouth Once A Day 3)  Ativan 1 Mg Tabs (Lorazepam) .Marland Kitchen.. 1 Tablet By Mouth Three Times A  Day and Two Tablets At Bedtime. Prescribed By Dr. Betti Cruz 4)  Bentyl 20 Mg Tabs (Dicyclomine Hcl) .... Take 2 Tablet By Mouth Four Times A Day As Needed For Burping and Nausea 5)  Hydrochlorothiazide 25 Mg Tabs (Hydrochlorothiazide) .... Take 1 Tablet By Mouth Once A Day 6)  Hydroxyzine Hcl 50 Mg Tabs (Hydroxyzine Hcl) .... 2 Tab By Mouth At Bedtime 7)  Synthroid 50 Mcg Tabs (Levothyroxine Sodium) .... 2  Tablet Bys Mouth Once A Day 8)  Prochlorperazine Maleate 10 Mg  Tabs (Prochlorperazine Maleate) .Marland Kitchen.. 1 Tablet Qid As Needed Nausea or Vomiting 9)  Cvs Pain Relief Extra Strength 500 Mg  Tabs (Acetaminophen) .... 2 Tablets Three To Four Times A  Day, As Needed For Osteoarthritis Pain. 10)  Colace 100 Mg  Caps (Docusate Sodium) .Marland Kitchen.. 1-2  Capsule At Bedtime 11)  Calcium 600/vitamin D 600-400 Mg-Unit  Tabs (Calcium Carbonate-Vitamin D) .Marland Kitchen.. 1 Tablet A Day By Mouth Twice A Day 12)  Prozac 40 Mg Caps (Fluoxetine Hcl) .... One Tablet Daily 13)  Vitamin D3 400 Unit Tabs (Cholecalciferol) .Marland Kitchen.. 1 Tablet By Mouth Daily 14)  Oxycodone Hcl 5 Mg Caps (Oxycodone Hcl) .... 2 Tablets By Mouth Twice A Day As Needed For Breakthrough Pain. Disp: 120. Fill On or After 08/06/09 15)  Oxycodone Hcl 5 Mg Tabs (Oxycodone Hcl) .... 2 Tablets By Mouth Twice A Day As Needed For Breakthrough Pain. Disp: 120. Fill On or After 09/06/2009. 16)  Oxycodone Hcl 5 Mg Tabs (Oxycodone Hcl) .... 2 Tablets By Mouth Twice A Day As Needed For  Breakthrough Pain. Disp: 120. Fill On or After 10/06/2009. 17)  Ms Contin 15 Mg Xr12h-Tab (Morphine Sulfate) .Marland Kitchen.. 1 Tablet By Mouth Every 12 Hours. Fill On or After 08/06/09 18)  Ms Contin 15 Mg Xr12h-Tab (Morphine Sulfate) .Marland Kitchen.. 1 Tablet By Mouth Every 12 Hours. Disp: 62.  Fill On or After 09/06/2009. 19)  Ms Contin 15 Mg Xr12h-Tab (Morphine Sulfate) .Marland Kitchen.. 1 Tablet By Mouth Every 12 Hours. Disp: 60.  Fill On or After 10/06/2009.  Allergies (verified): 1)  ! Arthrotec 50 (Diclofenac-Misoprostol) 2)  Naprosyn (Naproxen) 3)  Paxil (Paroxetine Hcl) 4)  Prednisone (Prednisone) 5)  * Propulcid 6)  Vivelle 7)  * Phenelzine 8)  Aspirin 9)  * Abilify  Past History:  Past medical, surgical, family and social histories (including risk factors) reviewed for relevance to current acute and chronic problems.  Past Medical History: Reviewed history from 09/03/2008 and no changes required. Risk Increased of DMT2  Framingham score 4% risk CV event in next 10 years,  HIGH RISK MED: MAOI INHIBITOR-SELEGELINE, Hx CA-MRSA SSTI(2007), L. Sacroiliitis,  Magnesium Hydroxide(MOM) qd prn constipation,  Multiple Medication Sensitivities,  Obesity Right Hip Trochanteric Bursitis (05/06),  Left hip osteoarthritis with reactive effusion, 2008 Intolerant of Paxil, propulcid, habitrol, welbutrin -, Bone Density, `98 O.K. MRI-Lumbar(08/03):GeneralDDD;L2-3Lg Ant HNP. Mod pos  hnp - 12/14/2005, smhnpw/irritL4root - 12/14/2005 Lumbar MRI (12/14/2005) with contrast: L3-4 Moderate size HNP with biforaminal protrusion - possible L3 root irritation  Irritable Bowel Syndrome- Mixed Diarrhea and Constipation  Past Surgical History: Reviewed history from 09/03/2008 and no changes required. Bladder Tack `98, [place] - 04/03/2002,   Bunionectomy, bilaterally with screws  Laparoscopy for Ectopic pregnancy   Colonoscopy 5/04 WNL Dr. Loreta Ave - 04/02/2003  Cystocele and rectocele repair 3/98, [place] -  EGD Left Hip fluoroscopically-guided  corticosteorid injection for painful osteoarthritis with good effect (06/2007) Explor. Lap  Facial Laceration repair as adolescent  Hysterectomy & RSO  Family History: Reviewed history from 07/22/2007 and no changes required. DMT2,  + HTN,  +MI, + Alcoholism, +migraine, +mood d/os,  +asthma  Social History: Reviewed history from 05/06/2009 and no changes required. 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 Working in [employer] department at [employer] smoking tobacco June 09. multiple times previously No etoh  Quit Smoking marajhuana June 09, no other illicit drugs use Religious - [employer] Divorced  4 children  No IVDASmoking Status:  current  Physical Exam  General:  Well-developed,obese,in no acute distress; alert,appropriate and cooperative throughout examination Lungs:  Normal respiratory effort, chest expands symmetrically. Lungs are clear to auscultation, no crackles or wheezes. Heart:  Normal rate and  regular rhythm. S1 and S2 normal without gallop, murmur, click, rub or other extra sounds. Pulses:    Extremities:  No edema. Neurologic:  Normal gait. Clear prosodic speech. Language composed and goal oriented. Psych:  normally interactive, good eye contact, and not anxious appearing.     Impression & Recommendations:  Problem # 1:  HYPERTENSION, BENIGN SYSTEMIC (ICD-401.1)  Adequate control. Tolerating medication. No new organ damage. Plan to continue current medication. Check BMET next OV to monitor medication side effects.  Her updated medication list for this problem includes:    Altace 10 Mg Caps (Ramipril) .Marland Kitchen... Take 1 capsule by mouth once a day    Hydrochlorothiazide 25 Mg Tabs (Hydrochlorothiazide) .Marland Kitchen... Take 1 tablet by mouth once a day  Orders: [facility]- Est Level  3 (16109)  Problem # 2:  DEPRESSION, MAJOR, RECURRENT (ICD-296.30) Assessment: Unchanged stable.  To see Dr Betti Cruz next week.   Problem # 3:   HYPOTHYROIDISM, BORDERLINE (ICD-244.9) Check TSH in July 2011. Her updated medication list for this problem includes:    Synthroid 50 Mcg Tabs (Levothyroxine sodium) .Marland Kitchen... 2  tablet bys mouth once a day  Problem # 4:  PREDIABETES (ICD-790.29) Check A1C in July 2011l  Problem # 5:  CHRONIC PAIN SYNDROME (ICD-338.4)  Adequate pain control. Adverse effect of constipation. No abberant behaviors noted.  Plan: Self-titrate up colace up to 6 gel caps a day.  May use Milk of Magnesia as needed.  Pt cannot afford Miralax. her insurer willnot pay for it.   Three Rxs (handwritten) for MS Contin 60 tablets to fill 11/08/09. 12/08/09 and 3/4/11given to patient  Three Rxs (handwritten) for Oxycodone 120 tablet to fill 11/08/09, 12/08/09, and 01/07/10 given to patient.  Patient may try trial of weaning  down her MS contin 15 mg to once daily for a week then every other day for a week then stop.  If she notes that she has to use significantly more oxycodone with this downward titration, she should stop the wean and resume the MS Contin.  RTC in 23 months to review effectiveness and side effects of chronic opiate therapy.   Orders: [facility]- Est Level  3 (60454)  Problem # 6:  OBESITY (ICD-278.00) Foy is planning to attend [employer] with her sister.   Problem # 7:  OSTEOARTHROSIS, LOCAL, PRIMARY, PELVIS/THIGH (ICD-715.15) Encoraged to add back Acetaminophen 1000 mg three times a day to her analgesic regiment.  Her updated medication list for this problem includes:    Cvs Pain Relief Extra Strength 500 Mg Tabs (Acetaminophen) .Marland Kitchen... 2 tablets three to four times a day, as needed for osteoarthritis pain.    Oxycodone Hcl 5 Mg Caps (Oxycodone hcl) .Marland Kitchen... 2 tablets by mouth twice a day as needed for breakthrough pain. disp: 120. fill on or after 11/08/09    Oxycodone Hcl 5 Mg Tabs (Oxycodone hcl) .Marland Kitchen... 2 tablets by mouth twice a day as needed for breakthrough pain. disp: 120. fill on or after 12/08/2009.    Oxycodone  Hcl 5 Mg Tabs (Oxycodone hcl) .Marland Kitchen... 2 tablets by mouth twice a day as needed for breakthrough pain. disp: 120. fill on or after 01/07/2010.    Ms Contin 15 Mg Xr12h-tab (Morphine sulfate) .Marland Kitchen... 1 tablet by mouth every 12 hours. fill on or after 11/08/2009    Ms Contin 15 Mg Xr12h-tab (Morphine sulfate) .Marland Kitchen... 1 tablet by mouth every 12 hours. disp: 62.  fill on or after 12/08/2009.    Ms Contin 15 Mg Xr12h-tab (  Morphine sulfate) .Marland Kitchen... 1 tablet by mouth every 12 hours. disp: 60.  fill on or after 01/07/2010.  Complete Medication List: 1)  Allegra 180 Mg Tabs (Fexofenadine hcl) .... Take 1 tablet by mouth once a day 2)  Altace 10 Mg Caps (Ramipril) .... Take 1 capsule by mouth once a day 3)  Ativan 1 Mg Tabs (Lorazepam) .Marland Kitchen.. 1 tablet by mouth three times a  day and two tablets at bedtime. prescribed by dr. reddy 4)  Bentyl 20 Mg Tabs (Dicyclomine hcl) .... Take 2 tablet by mouth four times a day as needed for burping and nausea 5)  Hydrochlorothiazide 25 Mg Tabs (Hydrochlorothiazide) .... Take 1 tablet by mouth once a day 6)  Hydroxyzine Hcl 50 Mg Tabs (Hydroxyzine hcl) .... 2 tab by mouth at bedtime 7)  Synthroid 50 Mcg Tabs (Levothyroxine sodium) .... 2  tablet bys mouth once a day 8)  Prochlorperazine Maleate 10 Mg Tabs (Prochlorperazine maleate) .Marland Kitchen.. 1 tablet qid as needed nausea or vomiting 9)  Cvs Pain Relief Extra Strength 500 Mg Tabs (Acetaminophen) .... 2 tablets three to four times a day, as needed for osteoarthritis pain. 10)  Colace 100 Mg Caps (Docusate sodium) .Marland Kitchen.. 1-2  capsule at bedtime 11)  Calcium 600/vitamin D 600-400 Mg-unit Tabs (Calcium carbonate-vitamin d) .Marland Kitchen.. 1 tablet a day by mouth twice a day 12)  Prozac 40 Mg Caps (Fluoxetine hcl) .... One tablet daily 13)  Vitamin D3 400 Unit Tabs (Cholecalciferol) .Marland Kitchen.. 1 tablet by mouth daily 14)  Oxycodone Hcl 5 Mg Caps (Oxycodone hcl) .... 2 tablets by mouth twice a day as needed for breakthrough pain. disp: 120. fill on or after 11/08/09 15)   Oxycodone Hcl 5 Mg Tabs (Oxycodone hcl) .... 2 tablets by mouth twice a day as needed for breakthrough pain. disp: 120. fill on or after 12/08/2009. 16)  Oxycodone Hcl 5 Mg Tabs (Oxycodone hcl) .... 2 tablets by mouth twice a day as needed for breakthrough pain. disp: 120. fill on or after 01/07/2010. 17)  Ms Contin 15 Mg Xr12h-tab (Morphine sulfate) .Marland Kitchen.. 1 tablet by mouth every 12 hours. fill on or after 11/08/2009 18)  Ms Contin 15 Mg Xr12h-tab (Morphine sulfate) .Marland Kitchen.. 1 tablet by mouth every 12 hours. disp: 62.  fill on or after 12/08/2009. 19)  Ms Contin 15 Mg Xr12h-tab (Morphine sulfate) .Marland Kitchen.. 1 tablet by mouth every 12 hours. disp: 60.  fill on or after 01/07/2010.  Patient Instructions: 1)  Please schedule a follow-up appointment in 3 months .  2)  I think joining [employer] would be a health decision, especially if you go to the weekly meetings.  3)  If you want to stop the MS Contin, take only on tablet a day for one week, then one tablet every other day for one week.  If you notice that you have to use alot more of your Oxycodone, then you problably still need the MS Contin. 4)  Increase your Colace up to six capsules a day if you need to for the constipation. 5)  Consider using Acetaminophen (Tylenol) 1000mg  three times a day for your osteoarthritis pains.  It will not stop the pain but it may decrease it more than not taking it.  6)  Congratulation on your abstinence from Smoking for Two years!!   Prevention & Chronic Care Immunizations   Influenza vaccine: given  (08/10/2009)   Influenza vaccine due: 08/10/2010    Tetanus booster: 03/06/2002: Done.   Tetanus booster due: 03/06/2012  Pneumococcal vaccine: Not documented  Colorectal Screening   Hemoccult: refused  (09/10/2008)   Hemoccult due: Not Indicated    Colonoscopy: Hyperplastic Polyp  (01/23/2008)   Colonoscopy due: 01/22/2018  Other Screening   Pap smear: refused  (11/12/2008)   Pap smear due: Not Indicated     Mammogram: Normal  (05/14/2009)   Mammogram due: 05/2011   Smoking status: current  (11/08/2009)   Smoking cessation counseling: yes  (03/09/2008)  Lipids   Total Cholesterol: 202  (08/02/2009)   Lipid panel action/deferral: Lipid Panel ordered   LDL: 98  (08/02/2009)   LDL Direct: 108  (12/24/2008)   HDL: 47  (08/02/2009)   Triglycerides: 287  (08/02/2009)    SGOT (AST): 23  (08/02/2009)   BMP action: Ordered   SGPT (ALT): 26  (08/02/2009)   Alkaline phosphatase: 65  (08/02/2009)   Total bilirubin: 0.3  (08/02/2009)    Lipid flowsheet reviewed?: Yes   Progress toward LDL goal: At goal  Hypertension   Last Blood Pressure: 140 / 85  (11/08/2009)   Serum creatinine: 0.54  (06/13/2007)   Serum potassium 3.5  (06/13/2007)    Hypertension flowsheet reviewed?: Yes   Progress toward BP goal: At goal  Self-Management Support :   Personal Goals (by the next clinic visit) :      Personal blood pressure goal: 140/90  (08/03/2009)     Personal LDL goal: 130  (11/08/2009)    Hypertension self-management support: Education handout, Written self-care plan  (08/02/2009)    Hypertension self-management support not done because: Good outcomes  (11/08/2009)    Lipid self-management support: Lipid monitoring log, Education handout, Written self-care plan  (08/02/2009)     Lipid self-management support not done because: Good outcomes  (11/08/2009)   Appended Document: [name],[name]    Clinical Lists Changes  Problems: Added new problem of TOBACCO USE, QUIT (ICD-V15.82) - Patient has had two years without smoking. Removed problem of TOBACCO DEPENDENCE (ICD-305.1)

## 2010-12-06 NOTE — Progress Notes (Signed)
Summary: Discuss TSH test result with patient  Phone Note Outgoing Call Call back at [facility] Phone [phone]   Call placed by: Tawanna Cooler Vernon Maish MD,  May 23, 2010 4:39 PM Call placed to: Patient Summary of Call: Informed patient of slightly high TSH.  Patient reports taking Synthroid 50 micrograms tablet, 2.5 tablets daily. Asked Ms [name] to increase Synthroid to 3 tablets daily.  Will recheck TSH is 4 weeks. Initial call taken by: Tawanna Cooler Phebe Dettmer MD,  May 23, 2010 4:40 PM

## 2010-12-06 NOTE — Progress Notes (Signed)
Summary: phnmsg  Phone Note Call from Patient Call back at Home Phone [phone]   Caller: Patient Summary of Call: pt is off work now and is avail to speak with Initial call taken by: De Nurse,  May 20, 2010 4:55 PM

## 2010-12-06 NOTE — Assessment & Plan Note (Signed)
Summary: panic attack ?per pt/McCormick/Mcdiarmid   Vital Signs:  Patient profile:   60 year old female Height:      64 inches Weight:      237.5 pounds BMI:     40.91 Temp:     97.6 degrees F oral Pulse rate:   76 / minute BP sitting:   150 / 86  (right arm) Cuff size:   regular  Vitals Entered By: Garen Grams LPN (January 11, 453 1:45 PM) CC: burning in her chest and anxiety Pain Assessment Patient in pain? no        Primary Care Provider:  TODD MCDIARMID MD  CC:  burning in her chest and anxiety.  History of Present Illness: 1. Burning in her chest:  Pt has had this burning sensation in her chest since this morning around 11:30.  She has had similar episodes in the past.  Most recently she was admitted to [facility] on 01/05/10 for this chest discomfort.  She had unchanged EKG and normal CEs.  This feels the same as that.  It is described as a diffuse burning sensation.  Seems to come on about 30 minutes after a meal, especially after drinking a lot of juice or eating a lot of food      ROS: she denies any chest pain, RUQ pain, fever, diaphoresis.  Endorsed one loose stool.  2. Anxiety:  She also doesn't think that her anxiety is well controlled.  She is being treated for this by a Psychiatrist who prescribeds her Ativan and Zoloft.  The Zoloft was decreased at last admission.  When she has an attack she gets nauseas and shaky.  She took an Ativan today which seems to help.      ROS: denies SI   PMHx: Fibromyalgia, Hypothyroidism, Depression, HTN, HLD  Habits & Providers  Alcohol-Tobacco-Diet     Tobacco Status: quit > 6 months  Current Medications (verified): 1)  Allegra 180 Mg Tabs (Fexofenadine Hcl) .... Take 1 Tablet By Mouth Once A Day 2)  Altace 10 Mg Caps (Ramipril) .... Take 1 Capsule By Mouth Once A Day 3)  Ativan 1 Mg Tabs (Lorazepam) .Marland Kitchen.. 1 Tablet By Mouth Three Times A  Day and Two Tablets At Bedtime. Prescribed By Dr. Betti Cruz 4)  Bentyl 20 Mg Tabs (Dicyclomine Hcl) ....  Take 2 Tablet By Mouth Four Times A Day As Needed For Burping and Nausea 5)  Hydrochlorothiazide 25 Mg Tabs (Hydrochlorothiazide) .... Take 1 Tablet By Mouth Once A Day 6)  Hydroxyzine Hcl 50 Mg Tabs (Hydroxyzine Hcl) .... 3 Tab By Mouth At Bedtime 7)  Synthroid 50 Mcg Tabs (Levothyroxine Sodium) .... 2  Tablet Bys Mouth Once A Day 8)  Prochlorperazine Maleate 10 Mg  Tabs (Prochlorperazine Maleate) .Marland Kitchen.. 1 Tablet Qid As Needed Nausea or Vomiting 9)  Cvs Pain Relief Extra Strength 500 Mg  Tabs (Acetaminophen) .... 2 Tablets Three To Four Times A Day, As Needed For Osteoarthritis Pain. 10)  Colace 100 Mg  Caps (Docusate Sodium) .... 4  Capsule At Bedtime 11)  Calcium 600/vitamin D 600-400 Mg-Unit  Tabs (Calcium Carbonate-Vitamin D) .Marland Kitchen.. 1 Tablet A Day By Mouth Twice A Day 12)  Prozac 40 Mg Caps (Fluoxetine Hcl) .Marland Kitchen.. 1 Tablet 13)  Vitamin D3 400 Unit Tabs (Cholecalciferol) .Marland Kitchen.. 1 Tablet By Mouth Daily 14)  Oxycodone Hcl 5 Mg Tabs (Oxycodone Hcl) .... 2 Tablets By Mouth Twice A Day As Needed For Breakthrough Pain. Disp: 120. Fill On or After 01/07/2010.  15)  Ms Contin 15 Mg Xr12h-Tab (Morphine Sulfate) .Marland Kitchen.. 1 Tablet By Mouth Every 12 Hours. Disp: 60.  Fill On or After 01/07/2010. 16)  Omeprazole 20 Mg Tbec (Omeprazole) .... Take 1 Tab By Mouth Once A Day For Heartburn  Allergies: 1)  ! Arthrotec 50 (Diclofenac-Misoprostol) 2)  Naprosyn (Naproxen) 3)  Paxil (Paroxetine Hcl) 4)  Prednisone (Prednisone) 5)  * Propulcid 6)  Vivelle 7)  * Phenelzine 8)  Aspirin 9)  * Abilify  Social History: Reviewed history from 01/05/2010 and no changes required. 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 Works occasionally at [employer] -- out for 2 months due to depression Working in [employer] department at [employer] smoking tobacco June 09. multiple times previously No etoh  Quit Smoking marajhuana June 09, no other illicit drugs use Religious - [employer] Divorced  4 children    No IVDA  Physical Exam  General:  Vitals reviewed. Obese,well-nourished,in no acute distress; alert,appropriate and cooperative throughout examination Eyes:  No corneal or conjunctival inflammation noted. EOMI. Perrla. Vision grossly normal. Neck:  no masses.   Lungs:  Normal respiratory effort, chest expands symmetrically. Lungs are clear to auscultation, no crackles or wheezes. Heart:  Normal rate and regular rhythm. S1 and S2 normal without gallop, murmur, click, rub or other extra sounds. Abdomen:  Bowel sounds positive,abdomen soft and non-tender without masses, organomegaly or hernias noted. Extremities:  No clubbing, cyanosis, edema, or deformity noted with normal full range of motion of all joints.   Neurologic:  grossly intact Psych:  Cognition and judgment appear intact. Alert and cooperative with normal attention span and concentration. No apparent delusions, illusions, hallucinations. Appears slightly anxious Additional Exam:  EKG unchanged compared to prior CEs negative from hospital admission   Impression & Recommendations:  Problem # 1:  CHEST PAIN UNSPECIFIED (ICD-786.50) Assessment Unchanged This is more of a chest burning than a pain.  EKG appears unchanged.  I think that it is more likely GERD.  Will start PPI.  She is scheduled for an exercise stress test next week. Orders: EKG- [facility] (EKG) [facility]- Est  Level 4 (11914)  Problem # 2:  ANXIETY (ICD-300.00) Assessment: Deteriorated  The chest burning likely exacerbaters her anxiety.  It does seem to improve with Ativan.  She is already taking significant amounts of Ativan will not make changes to her Prozac or Ativan.  She has an appointment with Dr. Betti Cruz next week, will defer changes to him. Her updated medication list for this problem includes:    Ativan 1 Mg Tabs (Lorazepam) .Marland Kitchen... 1 tablet by mouth three times a  day and two tablets at bedtime. prescribed by dr. reddy    Hydroxyzine Hcl 50 Mg Tabs (Hydroxyzine hcl)  .Marland KitchenMarland KitchenMarland KitchenMarland Kitchen 3 tab by mouth at bedtime    Prozac 40 Mg Caps (Fluoxetine hcl) .Marland Kitchen... 1 tablet  Orders: [facility]- Est  Level 4 (99214)  Complete Medication List: 1)  Allegra 180 Mg Tabs (Fexofenadine hcl) .... Take 1 tablet by mouth once a day 2)  Altace 10 Mg Caps (Ramipril) .... Take 1 capsule by mouth once a day 3)  Ativan 1 Mg Tabs (Lorazepam) .Marland Kitchen.. 1 tablet by mouth three times a  day and two tablets at bedtime. prescribed by dr. reddy 4)  Bentyl 20 Mg Tabs (Dicyclomine hcl) .... Take 2 tablet by mouth four times a day as needed for burping and nausea 5)  Hydrochlorothiazide 25 Mg Tabs (Hydrochlorothiazide) .... Take 1 tablet by mouth once  a day 6)  Hydroxyzine Hcl 50 Mg Tabs (Hydroxyzine hcl) .... 3 tab by mouth at bedtime 7)  Synthroid 50 Mcg Tabs (Levothyroxine sodium) .... 2  tablet bys mouth once a day 8)  Prochlorperazine Maleate 10 Mg Tabs (Prochlorperazine maleate) .Marland Kitchen.. 1 tablet qid as needed nausea or vomiting 9)  Cvs Pain Relief Extra Strength 500 Mg Tabs (Acetaminophen) .... 2 tablets three to four times a day, as needed for osteoarthritis pain. 10)  Colace 100 Mg Caps (Docusate sodium) .... 4  capsule at bedtime 11)  Calcium 600/vitamin D 600-400 Mg-unit Tabs (Calcium carbonate-vitamin d) .Marland Kitchen.. 1 tablet a day by mouth twice a day 12)  Prozac 40 Mg Caps (Fluoxetine hcl) .Marland Kitchen.. 1 tablet 13)  Vitamin D3 400 Unit Tabs (Cholecalciferol) .Marland Kitchen.. 1 tablet by mouth daily 14)  Oxycodone Hcl 5 Mg Tabs (Oxycodone hcl) .... 2 tablets by mouth twice a day as needed for breakthrough pain. disp: 120. fill on or after 01/07/2010. 15)  Ms Contin 15 Mg Xr12h-tab (Morphine sulfate) .Marland Kitchen.. 1 tablet by mouth every 12 hours. disp: 60.  fill on or after 01/07/2010. 16)  Omeprazole 20 Mg Tbec (Omeprazole) .... Take 1 tab by mouth once a day for heartburn  Patient Instructions: 1)  Your chest discomfort doesn't appear to be coming from your heart.   2)  I think that it may be related to acid reflux.  I am going to try an  empiric treatment with a acid reflux medication, hopefully this keeps it from happening as frequently. 3)  I would also avoid juices, spicy or greasy food.  These can all make heart burn worse 4)  I am not going to make any changes for your anxiety medicines.  I think that you need to discuss these changes with Dr. Betti Cruz and see what he thinks. Prescriptions: OMEPRAZOLE 20 MG TBEC (OMEPRAZOLE) Take 1 tab by mouth once a day for heartburn  #30 x 4   Entered and Authorized by:   Angelena Sole MD   Signed by:   Angelena Sole MD on 01/11/2010   Method used:   Electronically to        [facility] [address] #[id number]* (retail)       [address] [place], [place]  [address]       Ph: [phone]       Fax: [phone]   RxID:   [id number]

## 2010-12-06 NOTE — Consult Note (Signed)
Summary: [facility]   Imported By: Bradly Bienenstock 01/20/2010 17:24:42  _____________________________________________________________________  External Attachment:    Type:   Image     Comment:   External Document  Appended Document: [facility]    Clinical Lists Changes  Observations: Added new observation of CARDIO MD: Verdis Prime, III, MD (01/20/2010 17:27)       Habits & Providers     Cardiologist: Verdis Prime, III, MD

## 2010-12-06 NOTE — Assessment & Plan Note (Signed)
Summary: viral GI illness   Vital Signs:  Patient profile:   61 year old female Weight:      251.7 pounds Temp:     98.1 degrees F oral Pulse rate:   64 / minute Pulse rhythm:   regular BP sitting:   149 / 86  Vitals Entered By: Loralee Pacas CMA (Mar 30, 2010 11:11 AM) CC: weak and dizzy Is Patient Diabetic? No Comments pt states that she was sleeping on her back and when she woke up she was dizzy had to hold on to the wall because she was struggling to walk.  she got nauseated and vomiting, diarrhea and broke out in cold sweat    Primary Care Provider:  TODD MCDIARMID MD  CC:  weak and dizzy.  History of Present Illness: 60yo F c/o dizziness and vomiting  New dizziness and vomiting: Started yesterday while lying down after eating [employer].   States that she felt "unsteady" and had one episode of emesis (nonbloody).  Also had loose stools last night.  No further episodes today.  No fevers.  No sick contacts.  States she feels much better today without emesis.    Current Medications (verified): 1)  Hydrochlorothiazide 25 Mg Tabs (Hydrochlorothiazide) .... Take 1 Tablet By Mouth Once A Day 2)  Metoprolol Tartrate 50 Mg Tabs (Metoprolol Tartrate) .... One Tablet By Mouth Twice A Day 3)  Altace 10 Mg Caps (Ramipril) .... Take 1 Capsule By Mouth Once A Day 4)  Nitrostat 0.6 Mg Subl (Nitroglycerin) .Marland Kitchen.. 1 Tablet Sublingually Every 5 Minutes As Needed For Chest Pain.  Call Emergency Squad If No Relief By Third Tablet. 5)  Potassium Chloride Cr 10 Meq Cr-Tabs (Potassium Chloride) .... Take Two Tablets By Mouth At Bedtime 6)  Aspir-Low 81 Mg Tbec (Aspirin) .Marland Kitchen.. 1 Tablet By Mouth Daily 7)  Synthroid 50 Mcg Tabs (Levothyroxine Sodium) .... 2 and Half  Tablet By Mouth Once A Day 8)  Prozac 20 Mg Caps (Fluoxetine Hcl) .Marland Kitchen.. 1 Capsule By Mouth Daily 9)  Ativan 1 Mg Tabs (Lorazepam) .Marland Kitchen.. 1 Tablet By Mouth Three Times A  Day and Two Tablets At Bedtime. Prescribed By Dr. Betti Cruz 10)  Hydroxyzine  Hcl 50 Mg Tabs (Hydroxyzine Hcl) .... 3 Tab By Mouth At Bedtime 11)  Bentyl 20 Mg Tabs (Dicyclomine Hcl) .... Take 2 Tablet By Mouth Four Times A Day As Needed For Burping and Nausea 12)  Omeprazole 20 Mg Tbec (Omeprazole) .... Take 1 Tab By Mouth Twice A Day 13)  Prochlorperazine Maleate 10 Mg  Tabs (Prochlorperazine Maleate) .Marland Kitchen.. 1 Tablet Qid As Needed Nausea or Vomiting 14)  Colace 100 Mg  Caps (Docusate Sodium) .... 4  Capsule At Bedtime 15)  Calcium 600/vitamin D 600-400 Mg-Unit  Tabs (Calcium Carbonate-Vitamin D) .Marland Kitchen.. 1 Tablet A Day By Mouth Twice A Day 16)  Vitamin D3 400 Unit Tabs (Cholecalciferol) .Marland Kitchen.. 1 Tablet By Mouth Daily 17)  Cvs Pain Relief Extra Strength 500 Mg  Tabs (Acetaminophen) .... 2 Tablets Three To Four Times A Day, As Needed For Osteoarthritis Pain. 18)  Allegra 180 Mg Tabs (Fexofenadine Hcl) .... Take 1 Tablet By Mouth Once A Day 19)  Triamcinolone Acetonide 0.1 % Lotn (Triamcinolone Acetonide) .... Rub Iin Well To Area Where Patch Is To Be Applied. Disp: 60 Gram.  Refill: 1 20)  Ms Contin 15 Mg Xr12h-Tab (Morphine Sulfate) .Marland Kitchen.. 1 Tablet By Mouth Every 12 Hours. Disp: 60.  Fill On or After 03/08/2010. 21)  Ms Contin 15  Mg Xr12h-Tab (Morphine Sulfate) .Marland Kitchen.. 1 Tablet By Mouth Every 12 Hours. Disp:60. Fill On of After 04/07/10 22)  Ms Contin 15 Mg Xr12h-Tab (Morphine Sulfate) .Marland Kitchen.. 1 Tablet By Mouth Every 12 Hours. Disp:60. Fill On of After 05/07/10 23)  Oxycodone Hcl 5 Mg Tabs (Oxycodone Hcl) .... 2 Tablets By Mouth Twice A Day As Needed For Breakthrough Pain. Disp: 120. Fill On or After 03/08/2010. 24)  Oxycodone Hcl 5 Mg Tabs (Oxycodone Hcl) .... 2 Tablets By Mouth Twice A Day As Needed For Breakthrough Pain. Disp: 120. Fill On or After 04/07/2010. 25)  Oxycodone Hcl 5 Mg Tabs (Oxycodone Hcl) .... 2 Tablets By Mouth Twice A Day As Needed For Breakthrough Pain. Disp: 120. Fill On or After 05/07/2010. 26)  D3-50 50000 Unit Caps (Cholecalciferol) .... One Capsule By Mouth Weekly For 8  Weeks  Allergies (verified): 1)  ! Arthrotec 50 (Diclofenac-Misoprostol) 2)  Naprosyn (Naproxen) 3)  Paxil (Paroxetine Hcl) 4)  Prednisone (Prednisone) 5)  * Propulcid 6)  Vivelle 7)  * Phenelzine 8)  Aspirin 9)  * Abilify  Review of Systems      See HPI  Physical Exam  General:  VS Reviewed. Obese, non illl appearing, NAD. Pt able to ambulate and get on the exam table without difficulty  Eyes:  EOMI PERRLA Neck:  supple, full ROM, no goiter or mass  Lungs:  Normal respiratory effort, chest expands symmetrically. Lungs are clear to auscultation, no crackles or wheezes. Heart:  normal rate, regular rhythm, and no murmur.   Neurologic:  alert & oriented X3, cranial nerves II-XII intact, strength normal in all extremities, gait normal, and DTRs symmetrical and normal.     Impression & Recommendations:  Problem # 1:  VIRAL INFECTION, ACUTE (ICD-079.99) Assessment Improved  Pt w/ acute GI viral illness that seems to be self-resolving. No neurological deficits.  No further episodes or symptoms. Advised to rehydrate and rest. She is to f/u with Dr. McDiarmid.  Her updated medication list for this problem includes:    Aspir-low 81 Mg Tbec (Aspirin) .Marland Kitchen... 1 tablet by mouth daily    Cvs Pain Relief Extra Strength 500 Mg Tabs (Acetaminophen) .Marland Kitchen... 2 tablets three to four times a day, as needed for osteoarthritis pain.  Orders: [facility]- Est Level  3 (04540)  Problem # 2:  HYPOTHYROIDISM, BORDERLINE (ICD-244.9) Assessment: Comment Only Pt states she is out of meds.  Will provide enough until she can be seen by Dr. McDiarmid  Her updated medication list for this problem includes:    Synthroid 50 Mcg Tabs (Levothyroxine sodium) .Marland Kitchen... 2 and half  tablet by mouth once a day  Complete Medication List: 1)  Hydrochlorothiazide 25 Mg Tabs (Hydrochlorothiazide) .... Take 1 tablet by mouth once a day 2)  Metoprolol Tartrate 50 Mg Tabs (Metoprolol tartrate) .... One tablet by mouth twice  a day 3)  Altace 10 Mg Caps (Ramipril) .... Take 1 capsule by mouth once a day 4)  Nitrostat 0.6 Mg Subl (Nitroglycerin) .Marland Kitchen.. 1 tablet sublingually every 5 minutes as needed for chest pain.  call emergency squad if no relief by third tablet. 5)  Potassium Chloride Cr 10 Meq Cr-tabs (Potassium chloride) .... Take two tablets by mouth at bedtime 6)  Aspir-low 81 Mg Tbec (Aspirin) .Marland Kitchen.. 1 tablet by mouth daily 7)  Synthroid 50 Mcg Tabs (Levothyroxine sodium) .... 2 and half  tablet by mouth once a day 8)  Prozac 20 Mg Caps (Fluoxetine hcl) .Marland Kitchen.. 1 capsule by mouth daily  9)  Ativan 1 Mg Tabs (Lorazepam) .Marland Kitchen.. 1 tablet by mouth three times a  day and two tablets at bedtime. prescribed by dr. reddy 10)  Hydroxyzine Hcl 50 Mg Tabs (Hydroxyzine hcl) .... 3 tab by mouth at bedtime 11)  Bentyl 20 Mg Tabs (Dicyclomine hcl) .... Take 2 tablet by mouth four times a day as needed for burping and nausea 12)  Omeprazole 20 Mg Tbec (Omeprazole) .... Take 1 tab by mouth twice a day 13)  Prochlorperazine Maleate 10 Mg Tabs (Prochlorperazine maleate) .Marland Kitchen.. 1 tablet qid as needed nausea or vomiting 14)  Colace 100 Mg Caps (Docusate sodium) .... 4  capsule at bedtime 15)  Calcium 600/vitamin D 600-400 Mg-unit Tabs (Calcium carbonate-vitamin d) .Marland Kitchen.. 1 tablet a day by mouth twice a day 16)  Vitamin D3 400 Unit Tabs (Cholecalciferol) .Marland Kitchen.. 1 tablet by mouth daily 17)  Cvs Pain Relief Extra Strength 500 Mg Tabs (Acetaminophen) .... 2 tablets three to four times a day, as needed for osteoarthritis pain. 18)  Allegra 180 Mg Tabs (Fexofenadine hcl) .... Take 1 tablet by mouth once a day 19)  Triamcinolone Acetonide 0.1 % Lotn (Triamcinolone acetonide) .... Rub iin well to area where patch is to be applied. disp: 60 gram.  refill: 1 20)  Ms Contin 15 Mg Xr12h-tab (Morphine sulfate) .Marland Kitchen.. 1 tablet by mouth every 12 hours. disp: 60.  fill on or after 03/08/2010. 21)  Ms Contin 15 Mg Xr12h-tab (Morphine sulfate) .Marland Kitchen.. 1 tablet by mouth  every 12 hours. disp:60. fill on of after 04/07/10 22)  Ms Contin 15 Mg Xr12h-tab (Morphine sulfate) .Marland Kitchen.. 1 tablet by mouth every 12 hours. disp:60. fill on of after 05/07/10 23)  Oxycodone Hcl 5 Mg Tabs (Oxycodone hcl) .... 2 tablets by mouth twice a day as needed for breakthrough pain. disp: 120. fill on or after 03/08/2010. 24)  Oxycodone Hcl 5 Mg Tabs (Oxycodone hcl) .... 2 tablets by mouth twice a day as needed for breakthrough pain. disp: 120. fill on or after 04/07/2010. 25)  Oxycodone Hcl 5 Mg Tabs (Oxycodone hcl) .... 2 tablets by mouth twice a day as needed for breakthrough pain. disp: 120. fill on or after 05/07/2010. 26)  D3-50 50000 Unit Caps (Cholecalciferol) .... One capsule by mouth weekly for 8 weeks  Patient Instructions: 1)  Keep your appt with Dr. McDiarmid. 2)  Rehydrate yourself and rest today. 3)  This seems to be a transient viral illness. Prescriptions: SYNTHROID 50 MCG TABS (LEVOTHYROXINE SODIUM) 2 and half  tablet by mouth once a day  #150 x 0   Entered and Authorized by:   Marisue Ivan  MD   Signed by:   Marisue Ivan  MD on 03/30/2010   Method used:   Electronically to        [facility] [address] [facility] [address] #[id number]* (retail)       [address] [place], [place]  [address]       Ph: [phone]       Fax: [phone]   RxID:   [id number]

## 2010-12-06 NOTE — Miscellaneous (Signed)
Summary: TSH order  Clinical Lists Changes  Orders: Added new Test order of [facility] [id number]) - Signed

## 2010-12-06 NOTE — Progress Notes (Signed)
  Phone Note Call from Patient   Caller: Patient Call For: [phone] Summary of Call: [name] ER-300mg   is the name of the medicine that she is now own. Initial call taken by: Abundio Miu,  August 29, 2010 3:45 PM  Follow-up for Phone Call        [name] Notified

## 2010-12-06 NOTE — Letter (Signed)
Summary: Generic Letter  [facility]  [address]   [place], [place] [address]   Phone: [phone]  Fax: [phone]    06/22/2010 MRN: [id number]  [id number] [place], [place]  [address]  Dear Kathie Rhodes,  It is time for [place] to check your Thyroid function to see if the increase in your thyroid medication dose is working.  Please call the [facility] the day before you plan to come for the thyroid blood test so the lab will know to expect you.   Sincerely,   Tawanna Cooler McDiarmid MD [facility]  Appended Document: Generic Letter mailed.

## 2010-12-06 NOTE — Assessment & Plan Note (Signed)
Summary: Hospital H & P done 01-02-10   Primary Care Provider:  TODD MCDIARMID MD   History of Present Illness: 60 y/o pt with multiple medical problems who had a wave of diaphoresis at 1300 on 01-01-10. She had just eaten a large meal when she felt like she needed to sit on the toilet. While sitting on the toilet she have a wave of diaphoresis, had some diarrhea, and felt like she was going to vomit but took a Prochlorperazine tab and was able to keep from vomiting. She continued to sweat and told her grandson to call 911. She says as soon as the fireman and then EMS arrived she felt better and her BP came down. Pt does have a strong h/o panic attacks but says this one was different because of how strong the wave of diaphoresis was.    Current Medications (verified): 1)  Allegra 180 Mg Tabs (Fexofenadine Hcl) .... Take 1 Tablet By Mouth Once A Day 2)  Altace 10 Mg Caps (Ramipril) .... Take 1 Capsule By Mouth Once A Day 3)  Ativan 1 Mg Tabs (Lorazepam) .Marland Kitchen.. 1 Tablet By Mouth Three Times A  Day and Two Tablets At Bedtime. Prescribed By Dr. Betti Cruz 4)  Bentyl 20 Mg Tabs (Dicyclomine Hcl) .... Take 2 Tablet By Mouth Four Times A Day As Needed For Burping and Nausea 5)  Hydrochlorothiazide 25 Mg Tabs (Hydrochlorothiazide) .... Take 1 Tablet By Mouth Once A Day 6)  Hydroxyzine Hcl 50 Mg Tabs (Hydroxyzine Hcl) .... 3 Tab By Mouth At Bedtime 7)  Synthroid 50 Mcg Tabs (Levothyroxine Sodium) .... 2  Tablet Bys Mouth Once A Day 8)  Prochlorperazine Maleate 10 Mg  Tabs (Prochlorperazine Maleate) .Marland Kitchen.. 1 Tablet Qid As Needed Nausea or Vomiting 9)  Cvs Pain Relief Extra Strength 500 Mg  Tabs (Acetaminophen) .... 2 Tablets Three To Four Times A Day, As Needed For Osteoarthritis Pain. 10)  Colace 100 Mg  Caps (Docusate Sodium) .... 4  Capsule At Bedtime 11)  Calcium 600/vitamin D 600-400 Mg-Unit  Tabs (Calcium Carbonate-Vitamin D) .Marland Kitchen.. 1 Tablet A Day By Mouth Twice A Day 12)  Prozac 40 Mg Caps (Fluoxetine Hcl) ....  2 Tablets Daily 13)  Vitamin D3 400 Unit Tabs (Cholecalciferol) .Marland Kitchen.. 1 Tablet By Mouth Daily 14)  Oxycodone Hcl 5 Mg Tabs (Oxycodone Hcl) .... 2 Tablets By Mouth Twice A Day As Needed For Breakthrough Pain. Disp: 120. Fill On or After 12/08/2009. 15)  Oxycodone Hcl 5 Mg Tabs (Oxycodone Hcl) .... 2 Tablets By Mouth Twice A Day As Needed For Breakthrough Pain. Disp: 120. Fill On or After 01/07/2010. 16)  Ms Contin 15 Mg Xr12h-Tab (Morphine Sulfate) .Marland Kitchen.. 1 Tablet By Mouth Every 12 Hours. Disp: 62.  Fill On or After 12/08/2009. 17)  Ms Contin 15 Mg Xr12h-Tab (Morphine Sulfate) .Marland Kitchen.. 1 Tablet By Mouth Every 12 Hours. Disp: 60.  Fill On or After 01/07/2010.  Allergies (verified): 1)  ! Arthrotec 50 (Diclofenac-Misoprostol) 2)  Naprosyn (Naproxen) 3)  Paxil (Paroxetine Hcl) 4)  Prednisone (Prednisone) 5)  * Propulcid 6)  Vivelle 7)  * Phenelzine 8)  Aspirin 9)  * Abilify  Past History:  Past Medical History: Last updated: 09/03/2008 Risk Increased of DMT2  Framingham score 4% risk CV event in next 10 years,  HIGH RISK MED: MAOI INHIBITOR-SELEGELINE, Hx CA-MRSA SSTI(2007), L. Sacroiliitis,  Magnesium Hydroxide(MOM) qd prn constipation,  Multiple Medication Sensitivities,  Obesity Right Hip Trochanteric Bursitis (05/06),  Left hip osteoarthritis with reactive effusion, 2008  Intolerant of Paxil, propulcid, habitrol, welbutrin -, Bone Density, `98 O.K. MRI-Lumbar(08/03):GeneralDDD;L2-3Lg Ant HNP. Mod pos  hnp - 12/14/2005, smhnpw/irritL4root - 12/14/2005 Lumbar MRI (12/14/2005) with contrast: L3-4 Moderate size HNP with biforaminal protrusion - possible L3 root irritation  Irritable Bowel Syndrome- Mixed Diarrhea and Constipation  Past Surgical History: Last updated: 09/03/2008 Bladder Tack `98, [facility] - 04/03/2002,   Bunionectomy, bilaterally with screws  Laparoscopy for Ectopic pregnancy   Colonoscopy 5/04 WNL Dr. Loreta Ave - 04/02/2003  Cystocele and rectocele repair 3/98, [place] -  EGD Left Hip  fluoroscopically-guided corticosteorid injection for painful osteoarthritis with good effect (06/2007) Explor. Lap  Facial Laceration repair as adolescent  Hysterectomy & RSO  Family History: Last updated: 07/22/2007 DMT2,  + HTN,  +MI, + Alcoholism, +migraine, +mood d/os,  +asthma  Social History: Last updated: 05/06/2009 Retired [place] [employer] worker - early retirement for mental health reasons. Working in [employer] department at [employer] smoking tobacco June 09. multiple times previously No etoh  Quit Smoking marajhuana June 09, no other illicit drugs use Religious - [employer] Divorced  4 children  No IVDA  Risk Factors: Alcohol Use: 0 (11/08/2009)  Risk Factors: Smoking Status: current (11/08/2009) Packs/Day: 1 (03/09/2008)  Review of Systems        vitals reviewed and pertinent negatives and positives seen in HPI   Physical Exam  General:  Obese,well-nourished,in no acute distress; alert,appropriate and cooperative throughout examination Head:  Normocephalic and atraumatic without obvious abnormalities. No apparent alopecia or balding. Eyes:  No corneal or conjunctival inflammation noted. EOMI. Perrla. Vision grossly normal. Ears:  External ear exam shows no significant lesions or deformities. Hearing is grossly normal bilaterally. Nose:  External nasal examination shows no deformity or inflammation. Nasal mucosa are pink and moist without lesions or exudates. Lungs:  Normal respiratory effort, chest expands symmetrically. Lungs are clear to auscultation, no crackles or wheezes. Heart:  Normal rate and regular rhythm. S1 and S2 normal without gallop, murmur, click, rub or other extra sounds. Abdomen:  Bowel sounds positive,abdomen soft and non-tender without masses, organomegaly or hernias noted. Msk:  left knee tenderness when moved Pulses:  R and L dorsalis pedis and posterior tibial pulses are full and equal bilaterally Extremities:  No clubbing,  cyanosis, edema, or deformity noted with normal full range of motion of all joints.   Neurologic:  grossly intact Skin:  Intact without suspicious lesions or rashes Psych:  Cognition and judgment appear intact. Alert and cooperative with normal attention span and concentration. No apparent delusions, illusions, hallucinations. Appears slightly anxious especially about getting meds on time.   Labs: BMET: 137/3.7/101/28/18/0.97/139 POC Ce neg CE: Trop neg x1 CXR: mild stable chronic bronchitic changes, no acute abnormality  Impression & Recommendations:  Problem # 1:  EKG changes, long QT, flipped T waves Pt came to ED with no chest pain but when an EKG was done she had some changes (flipped T waves in V2-6 and QT prolongation). The ED MD asked [place] to admit for monitoring and MI rule out. She had POC CE that were neg, and a real set of CE while in the ED that was also neg. Plan to do 2 more sets of CE, do a FLP in the morning, and a BMET. Looked up her meds and it appears that the only one on her list that can cause prolonged QT syndrome is Prozac which was just recently increased from 40 mg to 80 mg a day.   Problem # 2:  Anxiety Pt  has a strong h/o anxiety and panic attacks. She sees Dr. Betti Cruz  (psychiatrist) and is taking Lorazepam 2mg  three times a day. She notes that all her symptoms yesterday started to go away when the fireman and EMS people arrived. Her story is consistant with an anxiety attack.  Problem # 3:  HYPERTENSION, BENIGN SYSTEMIC (ICD-401.1) BP is elevated. Will put pt back on home meds and cont to monitor.   Her updated medication list for this problem includes:    Altace 10 Mg Caps (Ramipril) .Marland Kitchen... Take 1 capsule by mouth once a day    Hydrochlorothiazide 25 Mg Tabs (Hydrochlorothiazide) .Marland Kitchen... Take 1 tablet by mouth once a day  Problem # 4:  OSTEOARTHRITIS, HANDS, BILATERAL (ICD-715.94)  Pt is managed her all her pain with the following meds.   Her updated medication  list for this problem includes:    Cvs Pain Relief Extra Strength 500 Mg Tabs (Acetaminophen) .Marland Kitchen... 2 tablets three to four times a day, as needed for osteoarthritis pain.    Oxycodone Hcl 5 Mg Tabs (Oxycodone hcl) .Marland Kitchen... 2 tablets by mouth twice a day as needed for breakthrough pain. disp: 120. fill on or after 12/08/2009.    Oxycodone Hcl 5 Mg Tabs (Oxycodone hcl) .Marland Kitchen... 2 tablets by mouth twice a day as needed for breakthrough pain. disp: 120. fill on or after 01/07/2010.    Ms Contin 15 Mg Xr12h-tab (Morphine sulfate) .Marland Kitchen... 1 tablet by mouth every 12 hours. disp: 62.  fill on or after 12/08/2009.    Ms Contin 15 Mg Xr12h-tab (Morphine sulfate) .Marland Kitchen... 1 tablet by mouth every 12 hours. disp: 60.  fill on or after 01/07/2010.  Problem # 5:  HYPOTHYROIDISM, BORDERLINE (ICD-244.9) Controlled. Last tested 05/2009.  Her updated medication list for this problem includes:    Synthroid 50 Mcg Tabs (Levothyroxine sodium) .Marland Kitchen... 2  tablet bys mouth once a day  Problem # 6:  GASTROESOPHAGEAL REFLUX, NO ESOPHAGITIS (ICD-530.81) Pt says she uses this 4 times a day and it helps with digestion.   Her updated medication list for this problem includes:    Bentyl 20 Mg Tabs (Dicyclomine hcl) .Marland Kitchen... Take 2 tablet by mouth four times a day as needed for burping and nausea  Problem # 7:  OBESITY (ICD-278.00) Pt is morbidly obese.  Problem # 8:  DEPRESSION, MAJOR, RECURRENT (ICD-296.30)  Pt just recently had her prozac increased. She says she doesn't really think it is helping but that she has tried everything else.   Problem # 9:  Prophy Plan to start heparin in am.   Problem # 10:  Dispo If all CE are neg and EKG changes are stable plan to d/c pt this afternoon with f/u in our clinic.   Complete Medication List: 1)  Allegra 180 Mg Tabs (Fexofenadine hcl) .... Take 1 tablet by mouth once a day 2)  Altace 10 Mg Caps (Ramipril) .... Take 1 capsule by mouth once a day 3)  Ativan 1 Mg Tabs (Lorazepam) .Marland Kitchen.. 1 tablet by  mouth three times a  day and two tablets at bedtime. prescribed by dr. reddy 4)  Bentyl 20 Mg Tabs (Dicyclomine hcl) .... Take 2 tablet by mouth four times a day as needed for burping and nausea 5)  Hydrochlorothiazide 25 Mg Tabs (Hydrochlorothiazide) .... Take 1 tablet by mouth once a day 6)  Hydroxyzine Hcl 50 Mg Tabs (Hydroxyzine hcl) .... 3 tab by mouth at bedtime 7)  Synthroid 50 Mcg Tabs (Levothyroxine sodium) .... 2  tablet bys mouth once a day 8)  Prochlorperazine Maleate 10 Mg Tabs (Prochlorperazine maleate) .Marland Kitchen.. 1 tablet qid as needed nausea or vomiting 9)  Cvs Pain Relief Extra Strength 500 Mg Tabs (Acetaminophen) .... 2 tablets three to four times a day, as needed for osteoarthritis pain. 10)  Colace 100 Mg Caps (Docusate sodium) .... 4  capsule at bedtime 11)  Calcium 600/vitamin D 600-400 Mg-unit Tabs (Calcium carbonate-vitamin d) .Marland Kitchen.. 1 tablet a day by mouth twice a day 12)  Prozac 40 Mg Caps (Fluoxetine hcl) .... 2 tablets daily 13)  Vitamin D3 400 Unit Tabs (Cholecalciferol) .Marland Kitchen.. 1 tablet by mouth daily 14)  Oxycodone Hcl 5 Mg Tabs (Oxycodone hcl) .... 2 tablets by mouth twice a day as needed for breakthrough pain. disp: 120. fill on or after 12/08/2009. 15)  Oxycodone Hcl 5 Mg Tabs (Oxycodone hcl) .... 2 tablets by mouth twice a day as needed for breakthrough pain. disp: 120. fill on or after 01/07/2010. 16)  Ms Contin 15 Mg Xr12h-tab (Morphine sulfate) .Marland Kitchen.. 1 tablet by mouth every 12 hours. disp: 62.  fill on or after 12/08/2009. 17)  Ms Contin 15 Mg Xr12h-tab (Morphine sulfate) .Marland Kitchen.. 1 tablet by mouth every 12 hours. disp: 60.  fill on or after 01/07/2010.

## 2010-12-06 NOTE — Miscellaneous (Signed)
Summary: TSH lab orders (Future)  Clinical Lists Changes  Orders: Added new Test order of [facility] [id number]) - Signed

## 2010-12-06 NOTE — Letter (Signed)
Summary: Reminder to come in for TSH testing  [facility]  [address]   [place], [place] [address]   Phone: [phone]  Fax: [phone]    05/11/2010 MRN: [id number]  [id number] [place], [place]  [address]  Dear [name],  It is time to check your thyroid levels because of the recent change in Synthroid dose.  At your convenience, come by the [facility] for your thyroid blood work.   Call the [facility] the day before you plan to come so they know when to expect you.   Let me know if you have questions about this thyroid testing.   Sincerely,  Legrand Como Lizabeth Fellner MD [facility]  Appended Document: Reminder to come in for [facility] testing mailed

## 2010-12-06 NOTE — Assessment & Plan Note (Signed)
Summary: f/u,df   Vital Signs:  Patient profile:   60 year old female Height:      64 inches Weight:      255.3 pounds BMI:     43.98 Temp:     98.0 degrees F oral Pulse rate:   64 / minute BP sitting:   127 / 79  (left arm) Cuff size:   regular  Vitals Entered By: Garen Grams LPN (May 30, 2010 2:28 PM) CC: f/u, Hypertension Management Is Patient Diabetic? No   Primary Care Provider:  Malvina Schadler MD  CC:  f/u and Hypertension Management.  History of Present Illness: Chronic Pain Syndrome. Less pain in hips now, more pain in bilateral knees.  Had not filled 04/07/10 MS contin Rx because she had adeqaute number of remaining tablets such that she did not need the refill.  She handed me the used Rx for MS Co0ntin 15 mg disp#60 with fill date of 04/07/10.   She has increased her stool softner to 4 to 5 capsules a day for constipation.  This has helped though not cured her constipation.  Denieis confusion, incoordination, GI upset.  She reports pain relief since last visit using MS Contin and oxycodone is 70%.  Pain has interfered with General activity 6 times in last 24 hours Pain has intergered with Mood 5 times in last 24 hours Pain has interfered with Ability to work (in or out of home) 4 times in last 24 hours Pain has interfered with interactions with other people 2 times in last 24 hours Pain has interfered with Sleep 5 times in last 24 hours. Pain has interfered with Enjoyment of lif 4 times in last 24 hours.   Depression Dr Betti Cruz started patient of Marlena Clipper  (Trazodone ER) 300 mg at bedtime.  Patient could not tolerate EMSAM patch because of skin rash reaction to adhesive of patch.  Denies excess sedation, agitation, palpitation, dizziness  HYPERTENSION Disease Monitoring   Blood pressure range:not monitoring at home   Chest pain:none   Dyspnea:none   Claudication:none  Medications   Compliance:taking ramipril, metoprolol, hctz.  Side effects  Lightheadedness:none   Urinary frequency:none   Edema:none   Prevention   Exercise: not exerccising.   Restless leg syndrom improved since starting potassium supplement daily.  Hypothyroidism Increased levothyroxine to 150 mg daily starting last week.       Hypertension History:      Positive major cardiovascular risk factors include female age 55 years old or older, hyperlipidemia, and hypertension.  Negative major cardiovascular risk factors include non-tobacco-user status.    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) 13)  Sigmoid Polyp  (ICD-211.3) 14)  Gastroesophageal Reflux, No Esophagitis  (ICD-530.81) 15)  Hemorrhoids, Nos  (ICD-455.6) 16)  Lactose Intolerance  (ICD-271.3) 17)  Irritable Bowel Syndrome  (ICD-564.1) 18)  Obesity  (ICD-278.00) 19)  Fibromyalgia  (ICD-729.1) 20)  Osteoarthritis, Hands, Bilateral  (ICD-715.94) 21)  Unequal Leg Length  (ICD-736.81) 22)  Osteoarthrosis, Local, Primary, Pelvis/thigh  (ICD-715.15)  23)  Hx of Carpal Tunnel Syndrome  (ICD-354.0) 24)  Osteoarthritis of Spine, Nos  (ICD-721.90) 25)  Rhinitis, Allergic  (ICD-477.9) 26)  Patello Femoral Stress Syndrome  (ICD-719.46) 27)  Disc With Radiculopathy  (ICD-722.71) 28)  Disc Syndrome, No Myelopathy, Nos  (ICD-722.2) 29)  COPD Without Exacerbation  (ICD-491.20) 30)  Fh of Neoplasm, Malignant, Colon  (ICD-153.9) 31)  Tobacco Use, Quit  (ICD-V15.82)  Current  Medications (verified): 1)  Hydrochlorothiazide 25 Mg Tabs (Hydrochlorothiazide) .... Take 1 Tablet By Mouth Once A Day 2)  Metoprolol Tartrate 50 Mg Tabs (Metoprolol Tartrate) .... One Tablet By Mouth Twice A Day 3)  Altace 10 Mg Caps (Ramipril) .... Take 1 Capsule By Mouth Once A Day 4)  Nitrostat 0.6 Mg Subl (Nitroglycerin) .Marland Kitchen.. 1 Tablet Sublingually Every 5 Minutes As Needed For Chest Pain.  Call Emergency Squad If No Relief By Third Tablet. 5)  Potassium Chloride Cr 10 Meq Cr-Tabs (Potassium Chloride) .... Take Two Tablets By Mouth At Bedtime 6)  Aspir-Low 81 Mg Tbec (Aspirin) .Marland Kitchen.. 1 Tablet By Mouth Daily 7)  Synthroid 50 Mcg Tabs (Levothyroxine Sodium) .... 3 Tablets By Mouth Once A Day 8)  Ativan 1 Mg Tabs (Lorazepam) .Marland Kitchen.. 1 Tablet By Mouth Three Times A  Day and Two Tablets At Bedtime. Prescribed By Dr. Betti Cruz 9)  Hydroxyzine Hcl 50 Mg Tabs (Hydroxyzine Hcl) .... 3 Tab By Mouth At Bedtime 10)  Bentyl 20 Mg Tabs (Dicyclomine Hcl) .... Take 2 Tablet By Mouth Four Times A Day As Needed For Burping and Nausea 11)  Omeprazole 20 Mg Tbec (Omeprazole) .... Take 1 Tab By Mouth Twice A Day 12)  Prochlorperazine Maleate 10 Mg  Tabs (Prochlorperazine Maleate) .Marland Kitchen.. 1 Tablet Qid As Needed Nausea or Vomiting 13)  Colace 100 Mg  Caps (Docusate Sodium) .... 4 To 5  Capsule At Bedtime 14)  Calcium 600/vitamin D 600-400 Mg-Unit  Tabs (Calcium Carbonate-Vitamin D) .Marland Kitchen.. 1 Tablet A Day By Mouth Twice A Day 15)  Cvs Pain Relief Extra Strength 500 Mg  Tabs (Acetaminophen) .... 2 Tablets Three To Four Times A Day, As Needed For Osteoarthritis Pain. 16)  Clarinex 5 Mg Tabs (Desloratadine) .... One Tablet By Mouth As Needed For Allergies 17)  Ms Contin 15 Mg Xr12h-Tab (Morphine Sulfate) .Marland Kitchen.. 1 Tablet By Mouth Every 12 Hours. Disp:60. Fill On of After 05/07/10 18)  Oxycodone Hcl 5 Mg Tabs (Oxycodone Hcl) .... 2 Tablets By Mouth Twice A Day As Needed For Breakthrough Pain. Disp: 120. Fill On or After 05/07/2010. 19)   Multivitamins   Tabs (Multiple Vitamin) .... One Daily  Allergies (verified): 1)  ! Arthrotec 50 (Diclofenac-Misoprostol) 2)  Naprosyn (Naproxen) 3)  Paxil (Paroxetine Hcl) 4)  Prednisone (Prednisone) 5)  * Propulcid 6)  Vivelle 7)  * Phenelzine 8)  Aspirin 9)  * Abilify 10)  * Emsam (Selegiline Patch)  Past History:  Past medical, surgical, family and social histories (including risk factors) reviewed for relevance to current acute and chronic problems.  Past Medical History: Reviewed history from 01/31/2010 and no changes required. Prediabetes Obesity Hx of Community-acquired MRSA Skin infection(2007), Hx of Left-sided Sacroiliitis HxRight Hip Trochanteric Bursitis (05/06) MRI-Lumbar(08/03):GeneralDDD;L2-3Lg Ant HNP. Mod pos  hnp - 12/14/2005, smhnpw/irritL4root - 12/14/2005 Lumbar MRI (12/14/2005) with contrast: L3-4 Moderate size HNP with biforaminal protrusion - possible L3 root irritation, Left hip osteoarthritis with reactive effusion, 2008  Multiple Medication Sensitivities Intolerant of Paxil, propulcid, habitrol, welbutrin   Irritable Bowel Syndrome- Mixed Diarrhea and Constipation  Cardic Angiocath & ventriculogram (01/27/10, Dr Mendel Ryder): Results: Widely patent coronary arteries, Normal left ventricular function.  Possible left ventricular  hypertrophy, Chest pain was probably noncardiac in origin, Abnormal nuclear study, felt to be false positive due to breast attenuation artifact on a 2-day protocol.  Past Surgical History: Reviewed history from 01/31/2010 and no changes required. Bladder Tack `98, [place] - 04/03/2002,   Bunionectomy, bilaterally with screws  Laparoscopy for Ectopic pregnancy   Colonoscopy 5/04 WNL Dr. Loreta Ave - 04/02/2003  Cystocele and rectocele repair 3/98, [place] -  EGD Left Hip fluoroscopically-guided corticosteorid injection for painful osteoarthritis with good effect (06/2007) Exploratory laparotomy Facial Laceration repair as adolescent    Hysterectomy & RSO  Family History: Reviewed history from 01/05/2010 and no changes required. (+)DMT2 ( + )HTN  +MI + Alcoholism +migraine +mood d/os +asthma  Social History: Reviewed history from 01/31/2010 and no changes required. 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 multiple times previously No etoh  Quit Smoking marajhuana June 09, no other illicit drugs use Religious - [employer] Divorced  4 children  No IVDA  Physical Exam  General:  VS Reviewed. Obese, non illl appearing, NAD. Pt able to ambulate and get on the exam table without difficulty  Lungs:  Normal respiratory effort, chest expands symmetrically. Lungs are clear to auscultation, no crackles or wheezes. Heart:  normal rate, regular rhythm, and no murmur.   Msk:  Knees without erythema or edema. (+) crepitus bilaterally Intact lateral and medial collateral ligaments.  No joint instability. (+) tender to palpation over lateral joint line bilaterally.  Extremities:  trace left pedal edema and trace right pedal edema. (+)2 Dorsalis pedal pulses bilaterally  Neurologic:  alert & oriented X3, cranial nerves II-XII intact, strength normal in all extremities, gait normal, and DTRs symmetrical and normal.   Psych:  memory intact for recent and remote, normally interactive, and good eye contact.   Well-groomed.    Impression & Recommendations:  Problem # 1:  CHRONIC PAIN SYNDROME (ICD-338.4)  Adequate pain control. Only adverse effect of constipation that patient is able to handle with diet and colace. No abberant behaviors noted.  Pain is now less in hips and more in bilateral knees.  Encouraged use of up to 4 gram Acetaminophen in divided scheduled doses. Patient is intolerant of NSAIDS.  her mood is severely disturbed by steroids. She will let me know if the knee pain  becomes severe enough that she desires to consult with orthopedics to discuss treatment options.   We do not have Knee X-rays/  Patient feels that potassium 20 mEq supplement at bedtime decreases her nighttime Restless Leg symptoms.   Three Rx(handwritten) for MS Contin 60 tablets to fill 8/1//11, 07/05/10, and 08/05/10 given to patient Three Rx (handwritten) for Oxycodone 120 tablet to fill 8/1//11, 07/05/10, and 08/05/10 given to patient.  RTC in 3  months to review effectiveness and side effects of chronic opiate therapy.   Orders: [facility]- Est  Level 4 (99214)  Problem # 2:  HYPOTHYROIDISM, BORDERLINE (ICD-244.9)  Will check TSH in 4 weeks onnew dose 150 micrograms levothyroxine a day.  Her updated medication list for this problem includes:    Synthroid 50 Mcg Tabs (Levothyroxine sodium) .Marland KitchenMarland KitchenMarland KitchenMarland Kitchen 3 tablets by mouth once a day  Orders: [facility]- Est  Level 4 (04540)  Problem # 3:  RESTLESS LEGS SYNDROME (ICD-333.94) Assessment: Improved  Improved with daily potassium supplement.  This intervention was an accidental discovery of patient during recent hospitalization when she received potassium and restless leg symptoms appeared to improve.  Plan to continue daily potassium supplement  Orders: [facility]- Est  Level 4 (21308)  Problem # 4:  Screening Breast Cancer (ICD-V76.10) Reminded patient it is time for screening mammogram.  Will remind again on next OV in 3 months if she has not obtained test.   Problem # 5:  HYPERTENSION, BENIGN SYSTEMIC (ICD-401.1) Assessment: Unchanged  Adequate control. Tolerating medication. No new organ damage. Plan to continue current medication.  Her updated medication list for this problem includes:    Hydrochlorothiazide 25 Mg Tabs (Hydrochlorothiazide) .Marland Kitchen... Take 1 tablet by mouth once a day    Metoprolol Tartrate 50 Mg Tabs (Metoprolol tartrate) ..... One tablet by mouth twice a day    Altace 10 Mg Caps (Ramipril) .Marland Kitchen... Take 1 capsule by mouth once a  day  Orders: [facility]- Est  Level 4 (65784)  Complete Medication List: 1)  Hydrochlorothiazide 25 Mg Tabs (Hydrochlorothiazide) .... Take 1 tablet by mouth once a day 2)  Metoprolol Tartrate 50 Mg Tabs (Metoprolol tartrate) .... One tablet by mouth twice a day 3)  Altace 10 Mg Caps (Ramipril) .... Take 1 capsule by mouth once a day 4)  Nitrostat 0.6 Mg Subl (Nitroglycerin) .Marland Kitchen.. 1 tablet sublingually every 5 minutes as needed for chest pain.  call emergency squad if no relief by third tablet. 5)  Potassium Chloride Cr 10 Meq Cr-tabs (Potassium chloride) .... Take two tablets by mouth at bedtime 6)  Aspir-low 81 Mg Tbec (Aspirin) .Marland Kitchen.. 1 tablet by mouth daily 7)  Synthroid 50 Mcg Tabs (Levothyroxine sodium) .... 3 tablets by mouth once a day 8)  Ativan 1 Mg Tabs (Lorazepam) .Marland Kitchen.. 1 tablet by mouth three times a  day and two tablets at bedtime. prescribed by dr. reddy 9)  Hydroxyzine Hcl 50 Mg Tabs (Hydroxyzine hcl) .... 3 tab by mouth at bedtime 10)  Bentyl 20 Mg Tabs (Dicyclomine hcl) .... Take 2 tablet by mouth four times a day as needed for burping and nausea 11)  Omeprazole 20 Mg Tbec (Omeprazole) .... Take 1 tab by mouth twice a day 12)  Prochlorperazine Maleate 10 Mg Tabs (Prochlorperazine maleate) .Marland Kitchen.. 1 tablet qid as needed nausea or vomiting 13)  Colace 100 Mg Caps (Docusate sodium) .... 4 to 5  capsule at bedtime 14)  Calcium 600/vitamin D 600-400 Mg-unit Tabs (Calcium carbonate-vitamin d) .Marland Kitchen.. 1 tablet a day by mouth twice a day 15)  Cvs Pain Relief Extra Strength 500 Mg Tabs (Acetaminophen) .... 2 tablets three to four times a day, as needed for osteoarthritis pain. 16)  Clarinex 5 Mg Tabs (Desloratadine) .... One tablet by mouth as needed for allergies 17)  Ms Contin 15 Mg Xr12h-tab (Morphine sulfate) .Marland Kitchen.. 1 tablet by mouth every 12 hours. disp:60. fill on of after 06/06/10 18)  Oxycodone Hcl 5 Mg Tabs (Oxycodone hcl) .... 2 tablets by mouth twice a day as needed for breakthrough pain. disp:  120. fill on or after 06/06/2010. 19)  Multivitamins Tabs (Multiple vitamin) .... One daily 20)  Ms Contin 15 Mg Xr12h-tab (Morphine sulfate) .Marland Kitchen.. 1 tablet by mouth every 12 hours. disp:60. fill on of after 07/05/10 21)  Ms Contin 15 Mg Xr12h-tab (Morphine sulfate) .Marland Kitchen.. 1 tablet by mouth every 12 hours. disp:60. fill on of after 08/05/10 22)  Oxycodone Hcl 5 Mg Tabs (Oxycodone hcl) .... 2 tablets by  mouth twice a day as needed for breakthrough pain. disp: 120. fill on or after 07/06/2010. 23)  Oxycodone Hcl 5 Mg Tabs (Oxycodone hcl) .... 2 tablets by mouth twice a day as needed for breakthrough pain. disp: 120. fill on or after 08/05/2010.  Hypertension Assessment/Plan:      The patient's hypertensive risk group is category B: At least one risk factor (excluding diabetes) with no target organ damage.  Her calculated 10 year risk of coronary heart disease is 7 %.  Today's blood pressure is 127/79.     Patient Instructions: 1)  Please schedule a follow-up appointment in 3 months .  2)  Keep active.  You are correct, losing weight will help with the knee pain.  Prescriptions: OXYCODONE HCL 5 MG TABS (OXYCODONE HCL) 2 tablets by mouth twice a day as needed for breakthrough pain. Disp: 120. Fill on or after 08/05/2010.  #120 x 0   Entered and Authorized by:   Tawanna Cooler Kaleya Douse MD   Signed by:   Tawanna Cooler Teala Daffron MD on 05/31/2010   Method used:   Handwritten   RxID:   [id number] OXYCODONE HCL 5 MG TABS (OXYCODONE HCL) 2 tablets by mouth twice a day as needed for breakthrough pain. Disp: 120. Fill on or after 07/06/2010.  #120 x 0   Entered and Authorized by:   Tawanna Cooler Angelly Spearing MD   Signed by:   Tawanna Cooler Johnn Krasowski MD on 05/31/2010   Method used:   Handwritten   RxID:   [id number] OXYCODONE HCL 5 MG TABS (OXYCODONE HCL) 2 tablets by mouth twice a day as needed for breakthrough pain. Disp: 120. Fill on or after 06/06/2010.  #120 x 0   Entered and Authorized by:   Tawanna Cooler Brittni Hult MD   Signed by:   Tawanna Cooler  Jazzalyn Loewenstein MD on 05/31/2010   Method used:   Handwritten   RxID:   [id number] MS CONTIN 15 MG XR12H-TAB (MORPHINE SULFATE) 1 tablet by mouth every 12 hours. Disp:60. Fill on of after 08/05/10  #60 x 0   Entered and Authorized by:   Tawanna Cooler Miliano Cotten MD   Signed by:   Tawanna Cooler Kadra Kohan MD on 05/31/2010   Method used:   Handwritten   RxID:   [id number] MS CONTIN 15 MG XR12H-TAB (MORPHINE SULFATE) 1 tablet by mouth every 12 hours. Disp:60. Fill on of after 07/05/10  #60 x 0   Entered and Authorized by:   Tawanna Cooler Ema Hebner MD   Signed by:   Tawanna Cooler Karletta Millay MD on 05/31/2010   Method used:   Handwritten   RxID:   [id number] MS CONTIN 15 MG XR12H-TAB (MORPHINE SULFATE) 1 tablet by mouth every 12 hours. Disp:60. Fill on of after 06/06/10  #60 x 0   Entered and Authorized by:   Tawanna Cooler Jeannelle Wiens MD   Signed by:   Tawanna Cooler Nawaf Strange MD on 05/31/2010   Method used:   Handwritten   RxID:   [id number] SYNTHROID 50 MCG TABS (LEVOTHYROXINE SODIUM) 3 tablets by mouth once a day  #270 x 3   Entered and Authorized by:   Tawanna Cooler Ottie Neglia MD   Signed by:   Tawanna Cooler Josia Cueva MD on 05/30/2010   Method used:   Faxed to ...       [facility] (mail-order)             , [place]         Ph:        Fax: [phone]   RxID:   [id number] METOPROLOL TARTRATE 50 MG  TABS (METOPROLOL TARTRATE) One tablet by mouth twice a day  #180 x 3   Entered and Authorized by:   Tawanna Cooler Rawn Quiroa MD   Signed by:   Tawanna Cooler Dolton Shaker MD on 05/30/2010   Method used:   Faxed to ...       [facility] (mail-order)             , [place]         Ph:        Fax: [phone]   RxID:   [id number] CLARINEX 5 MG TABS (DESLORATADINE) One tablet by mouth as needed for allergies  #90 x 3   Entered and Authorized by:   Tawanna Cooler Teah Votaw MD   Signed by:   Tawanna Cooler Laylonie Marzec MD on 05/30/2010   Method used:   Faxed to ...       [facility] (mail-order)             , [place]         Ph:        Fax: [phone]   RxID:   [id number] OMEPRAZOLE 20 MG TBEC  (OMEPRAZOLE) Take 1 tab by mouth twice a day  #180 x 3   Entered and Authorized by:   Tawanna Cooler Deagan Sevin MD   Signed by:   Tawanna Cooler Jasmina Gendron MD on 05/30/2010   Method used:   Faxed to ...       [facility] (mail-order)             , [place]         Ph:        Fax: [phone]   RxID:   [id number] SYNTHROID 50 MCG TABS (LEVOTHYROXINE SODIUM) 3 tablets by mouth once a day  #270 x 3   Entered and Authorized by:   Tawanna Cooler Maleik Vanderzee MD   Signed by:   Tawanna Cooler Shelvia Fojtik MD on 05/30/2010   Method used:   Faxed to ...       [facility] [employer])             , [place]         Ph:        Fax: [phone]   RxID:   [id number]    Prevention & Chronic Care Immunizations   Influenza vaccine: given  (08/10/2009)   Influenza vaccine due: 08/10/2010    Tetanus booster: 03/06/2002: Done.   Tetanus booster due: 03/06/2012    Pneumococcal vaccine: Not documented  Colorectal Screening   Hemoccult: refused  (09/10/2008)   Hemoccult due: Not Indicated    Colonoscopy: Hyperplastic Polyp  (01/23/2008)   Colonoscopy due: 01/22/2018  Other Screening   Pap smear: refused  (11/12/2008)   Pap smear due: Not Indicated    Mammogram: Normal  (05/14/2009)   Mammogram due: 05/2011   Smoking status: quit  (05/30/2010)  Lipids   Total Cholesterol: 202  (08/02/2009)   Lipid panel action/deferral: Lipid Panel ordered   LDL: 98  (08/02/2009)   LDL Direct: 108  (12/24/2008)   HDL: 47  (08/02/2009)   Triglycerides: 287  (08/02/2009)    SGOT (AST): 32  (01/26/2010)   BMP action: Ordered   SGPT (ALT): 46  (01/26/2010)   Alkaline phosphatase: 53  (01/26/2010)   Total bilirubin: 0.4  (01/26/2010)    Lipid flowsheet reviewed?: Yes   Progress toward LDL goal: At goal  Hypertension   Last Blood Pressure: 127 / 79  (05/30/2010)   Serum creatinine: 0.77  (01/28/2010)   Serum potassium 4.0  (01/28/2010)  Hypertension flowsheet reviewed?: Yes   Progress toward BP goal: At goal  Self-Management Support  :   Personal Goals (by the next clinic visit) :      Personal blood pressure goal: 140/90  (08/03/2009)     Personal LDL goal: 130  (11/08/2009)    Hypertension self-management support: Written self-care plan, Education handout, Pre-printed educational material  (02/02/2010)    Hypertension self-management support not done because: Good outcomes  (05/30/2010)    Lipid self-management support: Lipid monitoring log, Education handout, Written self-care plan  (08/02/2009)     Lipid self-management support not done because: Good outcomes  (05/30/2010)

## 2010-12-06 NOTE — Letter (Signed)
Summary: TSH results  [facility]  [address]   [place], [place] [address]   Phone: [phone]  Fax: [phone]    08/30/2010 MRN: [id number]  [address] [place], [place]  [id number]  Dear Kathie Rhodes,  Your Thyroid test is in the normal range.  Continue taking your thyroid medication, 3 tablets a day.   Congratulations again about the weight loss and the [employer] award!   Sincerely,   Tawanna Cooler McDiarmid MD [facility]  Appended Document: TSH results mailed.

## 2010-12-06 NOTE — Assessment & Plan Note (Signed)
Summary: h/fup,tcb   Vital Signs:  Patient profile:   60 year old female Height:      64 inches Weight:      239 pounds BMI:     41.17 BSA:     2.11 Temp:     98.0 degrees F Pulse rate:   78 / minute BP sitting:   158 / 89  Vitals Entered By: Jone Baseman CMA (January 05, 2010 10:10 AM) CC: HFU Is Patient Diabetic? No Pain Assessment Patient in pain? no        Primary Care Provider:  TODD MCDIARMID MD  CC:  HFU.  History of Present Illness: 1. hospital follow-up for panic attack Hospitalized 2/26 through 2/27 for panic attack. Ruled out for MI by serial cardiac enzymes. Pt d/c'd day after admission with recommendations for consideration of myoview for further risk stratification. Has h/o HLD and HTN. Last lipid panel here 9/10 should good lipid parameters. There was some question about whether the panic attack was caused by the dose of prozac (according to the patient) and this was therefore decreased from 80 mg to 40 mg daily. She has not noticed any change in her depression or anxiety since this change was made. Pt has been stable since discharge. Still with depression and occasional anxiety attacks.     Habits & Providers  Alcohol-Tobacco-Diet     Tobacco Status: quit > 6 months  Current Medications (verified): 1)  Allegra 180 Mg Tabs (Fexofenadine Hcl) .... Take 1 Tablet By Mouth Once A Day 2)  Altace 10 Mg Caps (Ramipril) .... Take 1 Capsule By Mouth Once A Day 3)  Ativan 1 Mg Tabs (Lorazepam) .Marland Kitchen.. 1 Tablet By Mouth Three Times A  Day and Two Tablets At Bedtime. Prescribed By Dr. Betti Cruz 4)  Bentyl 20 Mg Tabs (Dicyclomine Hcl) .... Take 2 Tablet By Mouth Four Times A Day As Needed For Burping and Nausea 5)  Hydrochlorothiazide 25 Mg Tabs (Hydrochlorothiazide) .... Take 1 Tablet By Mouth Once A Day 6)  Hydroxyzine Hcl 50 Mg Tabs (Hydroxyzine Hcl) .... 3 Tab By Mouth At Bedtime 7)  Synthroid 50 Mcg Tabs (Levothyroxine Sodium) .... 2  Tablet Bys Mouth Once A Day 8)   Prochlorperazine Maleate 10 Mg  Tabs (Prochlorperazine Maleate) .Marland Kitchen.. 1 Tablet Qid As Needed Nausea or Vomiting 9)  [facility]vs Pain Relief Extra Strength 500 Mg  Tabs (Acetaminophen) .... 2 Tablets Three To Four Times A Day, As Needed For Osteoarthritis Pain. 10)  Colace 100 Mg  Caps (Docusate Sodium) .... 4  Capsule At Bedtime 11)  Calcium 600/vitamin D 600-400 Mg-Unit  Tabs (Calcium Carbonate-Vitamin D) .Marland Kitchen.. 1 Tablet A Day By Mouth Twice A Day 12)  Prozac 40 Mg Caps (Fluoxetine Hcl) .Marland Kitchen.. 1 Tablet 13)  Vitamin D3 400 Unit Tabs (Cholecalciferol) .Marland Kitchen.. 1 Tablet By Mouth Daily 14)  Oxycodone Hcl 5 Mg Tabs (Oxycodone Hcl) .... 2 Tablets By Mouth Twice A Day As Needed For Breakthrough Pain. Disp: 120. Fill On or After 12/08/2009. 15)  Oxycodone Hcl 5 Mg Tabs (Oxycodone Hcl) .... 2 Tablets By Mouth Twice A Day As Needed For Breakthrough Pain. Disp: 120. Fill On or After 01/07/2010. 16)  Ms Contin 15 Mg Xr12h-Tab (Morphine Sulfate) .Marland Kitchen.. 1 Tablet By Mouth Every 12 Hours. Disp: 62.  Fill On or After 12/08/2009. 17)  Ms Contin 15 Mg Xr12h-Tab (Morphine Sulfate) .Marland Kitchen.. 1 Tablet By Mouth Every 12 Hours. Disp: 60.  Fill On or After 01/07/2010.  Allergies (verified): 1)  !  Arthrotec 50 (Diclofenac-Misoprostol) 2)  Naprosyn (Naproxen) 3)  Paxil (Paroxetine Hcl) 4)  Prednisone (Prednisone) 5)  * Propulcid 6)  Vivelle 7)  * Phenelzine 8)  Aspirin 9)  * Abilify  Past History:  Past Medical History: Reviewed history from 09/03/2008 and no changes required. Risk Increased of DMT2  Framingham score 4% risk CV event in next 10 years,  HIGH RISK MED: MAOI INHIBITOR-SELEGELINE, Hx CA-MRSA SSTI(2007), L. Sacroiliitis,  Magnesium Hydroxide(MOM) qd prn constipation,  Multiple Medication Sensitivities,  Obesity Right Hip Trochanteric Bursitis (05/06),  Left hip osteoarthritis with reactive effusion, 2008 Intolerant of Paxil, propulcid, habitrol, welbutrin -, Bone Density, `98 O.K. MRI-Lumbar(08/03):GeneralDDD;L2-3Lg Ant HNP.  Mod pos  hnp - 12/14/2005, smhnpw/irritL4root - 12/14/2005 Lumbar MRI (12/14/2005) with contrast: L3-4 Moderate size HNP with biforaminal protrusion - possible L3 root irritation  Irritable Bowel Syndrome- Mixed Diarrhea and Constipation  Past Surgical History: Reviewed history from 09/03/2008 and no changes required. Bladder Tack `98, [place] - 04/03/2002,   Bunionectomy, bilaterally with screws  Laparoscopy for Ectopic pregnancy   Colonoscopy 5/04 WNL Dr. Loreta Ave - 04/02/2003  Cystocele and rectocele repair 3/98, [place] -  EGD Left Hip fluoroscopically-guided corticosteorid injection for painful osteoarthritis with good effect (06/2007) Explor. Lap  Facial Laceration repair as adolescent  Hysterectomy & RSO  Family History: Reviewed history from 07/22/2007 and no changes required. DMT2,  + HTN,  +MI, + Alcoholism, +migraine, +mood d/os +asthma  Social History: Retired [place] [employer] worker - early retirement for mental health reasons. Works occasionally at [employer] -- out for 2 months due to depression Working in [employer] department at [employer] smoking tobacco June 09. multiple times previously No etoh  Quit Smoking marajhuana June 09, no other illicit drugs use Religious - [employer] Divorced  4 children  No IVDASmoking Status:  quit > 6 months  Review of Systems       no chest pain or SOB. bowel and bladder habits normal. No LE swelling.  Physical Exam  Additional Exam:  General:  Vital signs reviewed -- obese, hypertensive Alert, appropriate; well-dressed and well-nourished Neck: no cervical LAD or thyromegaly Lungs:  work of breathing unlabored, clear to auscultation bilaterally; no wheezes, rales, or ronchi; good air movement throughout Heart:  regular rate and rhythm, no murmurs; normal s1/s2 Pulses:  DP and radial pulses 2+ bilaterally  Extremities:  no cyanosis, clubbing, or edema Neurologic:  alert and oriented. speech normal. Psych: affect fulls, cooperative.     Impression & Recommendations:  Problem # 1:  DEPRESSION, MAJOR, RECURRENT (ICD-296.30) Assessment Unchanged  Advised pt to continue at current dose of 40 mg of prozac and move up her appointment with Dr. Betti Cruz to this month if possible. In the meantime, given RF for CAD (HTN, HLD) and recent weight gain, risk stratification with myoview would seem appropriate. Will try to sent up with Dr. Katrinka Blazing as the family is comfortable with him . To follow-up with Dr. McDiarmid in one month. To call if depression/anxiety worsens before that time.   Orders: [facility]- Est Level  3 (04540)  Problem # 2:  HYPERCHOLESTEROLEMIA (ICD-272.0) Assessment: Unchanged myoview as above for further risk stratification. to follow-up with Dr. McDiarmid in one month.  Orders: Cardiology Referral (Cardiology)  Complete Medication List: 1)  Allegra 180 Mg Tabs (Fexofenadine hcl) .... Take 1 tablet by mouth once a day 2)  Altace 10 Mg Caps (Ramipril) .... Take 1 capsule by mouth once a day 3)  Ativan 1 Mg Tabs (Lorazepam) .Marland Kitchen.. 1 tablet  by mouth three times a  day and two tablets at bedtime. prescribed by dr. reddy 4)  Bentyl 20 Mg Tabs (Dicyclomine hcl) .... Take 2 tablet by mouth four times a day as needed for burping and nausea 5)  Hydrochlorothiazide 25 Mg Tabs (Hydrochlorothiazide) .... Take 1 tablet by mouth once a day 6)  Hydroxyzine Hcl 50 Mg Tabs (Hydroxyzine hcl) .... 3 tab by mouth at bedtime 7)  Synthroid 50 Mcg Tabs (Levothyroxine sodium) .... 2  tablet bys mouth once a day 8)  Prochlorperazine Maleate 10 Mg Tabs (Prochlorperazine maleate) .Marland Kitchen.. 1 tablet qid as needed nausea or vomiting 9)  [facility]vs Pain Relief Extra Strength 500 Mg Tabs (Acetaminophen) .... 2 tablets three to four times a day, as needed for osteoarthritis pain. 10)  Colace 100 Mg Caps (Docusate sodium) .... 4  capsule at bedtime 11)  Calcium 600/vitamin D 600-400 Mg-unit Tabs (Calcium carbonate-vitamin d) .Marland Kitchen.. 1 tablet a day by mouth twice a  day 12)  Prozac 40 Mg Caps (Fluoxetine hcl) .Marland Kitchen.. 1 tablet 13)  Vitamin D3 400 Unit Tabs (Cholecalciferol) .Marland Kitchen.. 1 tablet by mouth daily 14)  Oxycodone Hcl 5 Mg Tabs (Oxycodone hcl) .... 2 tablets by mouth twice a day as needed for breakthrough pain. disp: 120. fill on or after 12/08/2009. 15)  Oxycodone Hcl 5 Mg Tabs (Oxycodone hcl) .... 2 tablets by mouth twice a day as needed for breakthrough pain. disp: 120. fill on or after 01/07/2010. 16)  Ms Contin 15 Mg Xr12h-tab (Morphine sulfate) .Marland Kitchen.. 1 tablet by mouth every 12 hours. disp: 62.  fill on or after 12/08/2009. 17)  Ms Contin 15 Mg Xr12h-tab (Morphine sulfate) .Marland Kitchen.. 1 tablet by mouth every 12 hours. disp: 60.  fill on or after 01/07/2010.  Patient Instructions: 1)  continue the prozac at 40 mg daily 2)  we'll work to get you set up with Dr. Katrinka Blazing for possible stress testing 3)  call Dr. Betti Cruz to get your appointment moved  up to later this month if possible 4)  follow-up with Dr. McDiarmid in one month 5)  call if you have any problems before then   Prevention & Chronic Care Immunizations   Influenza vaccine: given  (08/10/2009)   Influenza vaccine due: 08/10/2010    Tetanus booster: 03/06/2002: Done.   Tetanus booster due: 03/06/2012    Pneumococcal vaccine: Not documented  Colorectal Screening   Hemoccult: refused  (09/10/2008)   Hemoccult due: Not Indicated    Colonoscopy: Hyperplastic Polyp  (01/23/2008)   Colonoscopy due: 01/22/2018  Other Screening   Pap smear: refused  (11/12/2008)   Pap smear due: Not Indicated    Mammogram: Normal  (05/14/2009)   Mammogram due: 05/2011   Smoking status: quit > 6 months  (01/05/2010)  Lipids   Total Cholesterol: 202  (08/02/2009)   Lipid panel action/deferral: Lipid Panel ordered   LDL: 98  (08/02/2009)   LDL Direct: 108  (12/24/2008)   HDL: 47  (08/02/2009)   Triglycerides: 287  (08/02/2009)    SGOT (AST): 23  (08/02/2009)   BMP action: Ordered   SGPT (ALT): 26  (08/02/2009)    Alkaline phosphatase: 65  (08/02/2009)   Total bilirubin: 0.3  (08/02/2009)  Hypertension   Last Blood Pressure: 158 / 89  (01/05/2010)   Serum creatinine: 0.54  (06/13/2007)   Serum potassium 3.5  (06/13/2007)    Hypertension flowsheet reviewed?: Yes   Progress toward BP goal: Deteriorated  Self-Management Support :   Personal Goals (by the  next clinic visit) :      Personal blood pressure goal: 140/90  (08/03/2009)     Personal LDL goal: 130  (11/08/2009)    Hypertension self-management support: Education handout, Written self-care plan  (08/02/2009)    Hypertension self-management support not done because: Not indicated  (01/05/2010)    Lipid self-management support: Lipid monitoring log, Education handout, Written self-care plan  (08/02/2009)     Lipid self-management support not done because: Not indicated  (01/05/2010)

## 2010-12-06 NOTE — Miscellaneous (Signed)
Summary: Flu vaccine received  Clinical Lists Changes  received notification from  [facility] that patient received Flu vaccine 07/22/2010 Theresia Lo RN  July 25, 2010 3:10 PM  Observations: Added new observation of FLU VAX: Historical (07/22/2010 15:10)      Influenza Immunization History:    Influenza # 1:  Historical (07/22/2010)

## 2010-12-06 NOTE — Miscellaneous (Signed)
Summary: call from [facility] regarding  Clarinex  Clinical Lists Changes  received call from [facility] requesting directions on Clarinex  5 mg  that was sent in on 05/30/2010 by Dr. Bufford Helms.  RN advised it should be one tablet daily as needed for allergies   and give # 90 tabs for a 90 day supply and 3 refills Lujuana Kapler RN  June 01, 2010 11:21 AM

## 2010-12-06 NOTE — Assessment & Plan Note (Signed)
Summary: f/up,tcb   Vital Signs:  Patient profile:   60 year old female Height:      64 inches Weight:      241.4 pounds BMI:     41.59 Temp:     98.1 degrees F oral Pulse rate:   91 / minute BP sitting:   158 / 92  (right arm) Cuff size:   regular  Vitals Entered By: Garen Grams LPN (January 31, 2010 2:48 PM) CC: hospitalization follow up  Is Patient Diabetic? No Pain Assessment Patient in pain? no        Primary Care Provider:  TODD MCDIARMID MD  CC:  hospitalization follow up .  History of Present Illness: Hospitalization  [name] was admitted onto Dr Katrinka Blazing (card) service for cardiac catheterization for chest pain with abnormal nuc med stress test.  Her cardiac cath showed widely patent coronary arteries with normal ejection fraction.  She developed a right groin hematoma at the arterial puncture site that has not progressed. She was discharged from hospital on 01/28/10.  She has not had any further chest pain.  She denies shortness of breath, cough, or fever.  No new leg swelling or pains. She will have Cardiology f/u on 02/01/10 at Dr Michaelle Copas office.   Only new medication was the addition of Aspirin 81 mg daily.  Chronic Pain Syndrome Adequate pain control and restless leg symptoms currently.   She has not returned to work.  She has been out since beginning of January, primarily due to worsening of her Major Depression and her chest pain.  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   She has not had to use as frequent oxycodone for breakthrough pain since she has not been working.  Taking Oxycodone 5 mg tab, between 2 tabs a day. She is Taking MS Contin 15 mg two times a day Constipation has been a problem.  She manages it with colace 2 tablets once a day  She is not currently interested is stopping MS Contin.       Habits & Providers  Alcohol-Tobacco-Diet     Alcohol drinks/day: 0     Tobacco Status: quit     Tobacco Counseling: to  remain off tobacco products  Exercise-Depression-Behavior     Have you felt down or hopeless? no     Have you felt little pleasure in things? no     Depression Counseling: not indicated; screening negative for depression  Current Medications (verified): 1)  Hydrochlorothiazide 25 Mg Tabs (Hydrochlorothiazide) .... Take 1 Tablet By Mouth Once A Day 2)  Metoprolol Tartrate 50 Mg Tabs (Metoprolol Tartrate) .... One Tablet By Mouth Twice A Day 3)  Altace 10 Mg Caps (Ramipril) .... Take 1 Capsule By Mouth Once A Day 4)  Nitrostat 0.6 Mg Subl (Nitroglycerin) .Marland Kitchen.. 1 Tablet Sublingually Every 5 Minutes As Needed For Chest Pain.  Call Emergency Squad If No Relief By Third Tablet. 5)  Potassium Chloride Cr 10 Meq Cr-Tabs (Potassium Chloride) .... Take Two Tablets By Mouth At Bedtime 6)  Aspir-Low 81 Mg Tbec (Aspirin) .Marland Kitchen.. 1 Tablet By Mouth Daily 7)  Synthroid 50 Mcg Tabs (Levothyroxine Sodium) .... 2  Tablet Bys Mouth Once A Day 8)  Prozac 20 Mg Caps (Fluoxetine Hcl) .Marland Kitchen.. 1 Capsule By Mouth Daily 9)  Ativan 1 Mg Tabs (Lorazepam) .Marland Kitchen.. 1 Tablet By Mouth Three Times A  Day and Two Tablets At Bedtime. Prescribed By Dr. Betti Cruz 10)  Hydroxyzine  Hcl 50 Mg Tabs (Hydroxyzine Hcl) .... 3 Tab By Mouth At Bedtime 11)  Bentyl 20 Mg Tabs (Dicyclomine Hcl) .... Take 2 Tablet By Mouth Four Times A Day As Needed For Burping and Nausea 12)  Omeprazole 20 Mg Tbec (Omeprazole) .... Take 1 Tab By Mouth Twice A Day 13)  Prochlorperazine Maleate 10 Mg  Tabs (Prochlorperazine Maleate) .Marland Kitchen.. 1 Tablet Qid As Needed Nausea or Vomiting 14)  Ms Contin 15 Mg Xr12h-Tab (Morphine Sulfate) .Marland Kitchen.. 1 Tablet By Mouth Every 12 Hours. Disp: 60.  Fill On or After 02/06/2010. 15)  Oxycodone Hcl 5 Mg Tabs (Oxycodone Hcl) .... 2 Tablets By Mouth Twice A Day As Needed For Breakthrough Pain. Disp: 120. Fill On or After 02/06/2010. 16)  Colace 100 Mg  Caps (Docusate Sodium) .... 4  Capsule At Bedtime 17)  Calcium 600/vitamin D 600-400 Mg-Unit  Tabs  (Calcium Carbonate-Vitamin D) .Marland Kitchen.. 1 Tablet A Day By Mouth Twice A Day 18)  Vitamin D3 400 Unit Tabs (Cholecalciferol) .Marland Kitchen.. 1 Tablet By Mouth Daily 19)  [facility]vs Pain Relief Extra Strength 500 Mg  Tabs (Acetaminophen) .... 2 Tablets Three To Four Times A Day, As Needed For Osteoarthritis Pain. 20)  Allegra 180 Mg Tabs (Fexofenadine Hcl) .... Take 1 Tablet By Mouth Once A Day  Allergies (verified): 1)  ! Arthrotec 50 (Diclofenac-Misoprostol) 2)  Naprosyn (Naproxen) 3)  Paxil (Paroxetine Hcl) 4)  Prednisone (Prednisone) 5)  * Propulcid 6)  Vivelle 7)  * Phenelzine 8)  Aspirin 9)  * Abilify  Past History:  Past Medical History: Prediabetes Obesity Hx of Community-acquired MRSA Skin infection(2007), Hx of Left-sided Sacroiliitis HxRight Hip Trochanteric Bursitis (05/06) MRI-Lumbar(08/03):GeneralDDD;L2-3Lg Ant HNP. Mod pos  hnp - 12/14/2005, smhnpw/irritL4root - 12/14/2005 Lumbar MRI (12/14/2005) with contrast: L3-4 Moderate size HNP with biforaminal protrusion - possible L3 root irritation, Left hip osteoarthritis with reactive effusion, 2008  Multiple Medication Sensitivities Intolerant of Paxil, propulcid, habitrol, welbutrin   Irritable Bowel Syndrome- Mixed Diarrhea and Constipation  Cardic Angiocath & ventriculogram (01/27/10, Dr Mendel Ryder): Results: Widely patent coronary arteries, Normal left ventricular function.  Possible left ventricular  hypertrophy, Chest pain was probably noncardiac in origin, Abnormal nuclear study, felt to be false positive due to breast attenuation artifact on a 2-day protocol.  Past Surgical History: Bladder Tack `98, [place] - 04/03/2002,   Bunionectomy, bilaterally with screws  Laparoscopy for Ectopic pregnancy   Colonoscopy 5/04 WNL Dr. Loreta Ave - 04/02/2003  Cystocele and rectocele repair 3/98, [place] -  EGD Left Hip fluoroscopically-guided corticosteorid injection for painful osteoarthritis with good effect (06/2007) Exploratory laparotomy Facial Laceration  repair as adolescent  Hysterectomy & RSO  Social History: Retired [place] [employer] worker - early retirement for mental health reasons. Works occasionally at [employer] Working in [employer] at [employer] smoking tobacco June 09. multiple times previously No etoh  Quit Smoking marajhuana June 09, no other illicit drugs use Religious - [employer] Divorced  4 children  No IVDASmoking Status:  quit  Physical Exam  General:  Vitals reviewed. Obese,well-nourished,in no acute distress; alert,appropriate and cooperative throughout examination Neck:  No JVD Lungs:  Normal respiratory effort, chest expands symmetrically. Lungs are clear to auscultation, no crackles or wheezes. Heart:  Normal rate and regular rhythm. S1 and S2 normal without gallop, murmur, click, rub or other extra sounds. Extremities:  trace left pedal edema and trace right pedal edema. (+)2 Dorsalis pedal pulses bilaterally large ecchymosis anterior right thigh extending arcuate edge approx mid anterior thigh No bruit at  right femoral artery  Psych:  memory intact for recent and remote, normally interactive, good eye contact, not anxious appearing, and not depressed appearing.   Speech clear and prosodic.  Language grammatical and goal-directed.    Impression & Recommendations:  Problem # 1:  CHEST PAIN UNSPECIFIED (ICD-786.50) Assessment Improved  Widely patent coronary arteries on Cardiac angiocath by Dr Mendel Ryder on 01/27/10.  Uncertain origin of the pain, but currently resolved.  Patient to continue on higher dose of omeprazole, 20mg  tablets, one tablet twice a day.  I asked patient to carry NTG SL with her in case these chest pains were Prinztmetal angina.   Continue the Aspirin 81 mg daily started by Dr Katrinka Blazing.   Orders: [facility]- Est  Level 4 ([address])  Problem # 2:  CHRONIC PAIN SYNDROME (ICD-338.4) Assessment: Comment Only  Adequate pain control. Only dverse effect of constipation that patient is able to  handle with diet and colace. . No abberant behaviors noted.  Will add potassium 20 mEq supplement at bedtime as patient believed the potassium supplement she recieved during her admission helped her nighttime Restless Leg symptoms.   One Rx(handwritten) for MS Contin 60 tablets to fill 02/06/10 given to patient One Rx (handwritten) for Oxycodone 120 tablet to fill 02/06/10 given to patient.  RTC in 1  months to review effectiveness and side effects of chronic opiate therapy.   Problem # 3:  HYPERTENSION, BENIGN SYSTEMIC (ICD-401.1) Assessment: Deteriorated  Inadequate BP control.  No new end-organ damage clinically. No coronary atherosclerosis on Cardiac angiocath on 3/34/11.  Will add Metoprolol 50 mg two times a day to current regiment.  Monitor for response and adverse effects on OV in one month.  Her updated medication list for this problem includes:    Hydrochlorothiazide 25 Mg Tabs (Hydrochlorothiazide) .Marland Kitchen... Take 1 tablet by mouth once a day    Metoprolol Tartrate 50 Mg Tabs (Metoprolol tartrate) ..... One tablet by mouth twice a day    Altace 10 Mg Caps (Ramipril) .Marland Kitchen... Take 1 capsule by mouth once a day  BP today: 158/92 Prior BP: 150/86 (01/11/2010)  Labs Reviewed: K+: 4.0 (01/28/2010) Creat: : 0.77 (01/28/2010)   Chol: 202 (08/02/2009)   HDL: 47 (08/02/2009)   LDL: 98 (08/02/2009)   TG: 287 (08/02/2009)  Orders: [facility]- Est  Level 4 ([address])  Problem # 4:  DEPRESSION, MAJOR, RECURRENT (ICD-296.30) Assessment: Comment Only No current SI.  Montasia is to see Dr Betti Cruz this upcoming week.  She does not believe the Prozac 20 mg per day (it was cut down from 40 mg daily during her February hospitlaization b/c of QTc prolongation) is helping.  She would like to retry the [facility] knowing that she will likely redevelop the localized skin reaction to the patch adhesive she experienced consistently with her prior use of the preparation.   Problem # 5:  HYPOTHYROIDISM, BORDERLINE  (ICD-244.9)  TSH serum level last week during her hospitalization was elevated to  ~ 8.5.   Will recheck TSH w;it FT4 on next office vist in one month to see if she need adjustment in her levothyroxine. Of note, she has never had a FT4 that was abnormal.  Her updated medication list for this problem includes:    Synthroid 50 Mcg Tabs (Levothyroxine sodium) .Marland Kitchen... 2  tablet bys mouth once a day  Orders: [facility]- Est  Level 4 ([address]11)  Complete Medication List: 1)  Hydrochlorothiazide 25 Mg Tabs (Hydrochlorothiazide) .... Take 1 tablet by mouth once a day 2)  Metoprolol Tartrate 50 Mg Tabs (Metoprolol tartrate) .... One tablet by mouth twice a day 3)  Altace 10 Mg Caps (Ramipril) .... Take 1 capsule by mouth once a day 4)  Nitrostat 0.6 Mg Subl (Nitroglycerin) .Marland Kitchen.. 1 tablet sublingually every 5 minutes as needed for chest pain.  call emergency squad if no relief by third tablet. 5)  Potassium Chloride Cr 10 Meq Cr-tabs (Potassium chloride) .... Take two tablets by mouth at bedtime 6)  Aspir-low 81 Mg Tbec (Aspirin) .Marland Kitchen.. 1 tablet by mouth daily 7)  Synthroid 50 Mcg Tabs (Levothyroxine sodium) .... 2  tablet bys mouth once a day 8)  Prozac 20 Mg Caps (Fluoxetine hcl) .Marland Kitchen.. 1 capsule by mouth daily 9)  Ativan 1 Mg Tabs (Lorazepam) .Marland Kitchen.. 1 tablet by mouth three times a  day and two tablets at bedtime. prescribed by dr. reddy 10)  Hydroxyzine Hcl 50 Mg Tabs (Hydroxyzine hcl) .... 3 tab by mouth at bedtime 11)  Bentyl 20 Mg Tabs (Dicyclomine hcl) .... Take 2 tablet by mouth four times a day as needed for burping and nausea 12)  Omeprazole 20 Mg Tbec (Omeprazole) .... Take 1 tab by mouth twice a day 13)  Prochlorperazine Maleate 10 Mg Tabs (Prochlorperazine maleate) .Marland Kitchen.. 1 tablet qid as needed nausea or vomiting 14)  Ms Contin 15 Mg Xr12h-tab (Morphine sulfate) .Marland Kitchen.. 1 tablet by mouth every 12 hours. disp: 60.  fill on or after 02/06/2010. 15)  Oxycodone Hcl 5 Mg Tabs (Oxycodone hcl) .... 2 tablets by mouth  twice a day as needed for breakthrough pain. disp: 120. fill on or after 02/06/2010. 16)  Colace 100 Mg Caps (Docusate sodium) .... 4  capsule at bedtime 17)  Calcium 600/vitamin D 600-400 Mg-unit Tabs (Calcium carbonate-vitamin d) .Marland Kitchen.. 1 tablet a day by mouth twice a day 18)  Vitamin D3 400 Unit Tabs (Cholecalciferol) .Marland Kitchen.. 1 tablet by mouth daily 19)  [facility]vs Pain Relief Extra Strength 500 Mg Tabs (Acetaminophen) .... 2 tablets three to four times a day, as needed for osteoarthritis pain. 20)  Allegra 180 Mg Tabs (Fexofenadine hcl) .... Take 1 tablet by mouth once a day  Patient Instructions: 1)  Please schedule a follow-up appointment in 1 month.  2)  New medication: Metoprolol, 50 mg tablet, take one tablet twice a day for high blood pressure 3)  New medication: Potassium tablets, take two tablets at bedtime. 4)  New medication: Nitroglycerin tablets, place one tablet under your tongue every five minutes for chest pain.  Call the emergency squad if no imprvement in chest pain by third tablet.  Keept the Nitroglycerin tablets with you or nearby at all times.  Prescriptions: METOPROLOL TARTRATE 50 MG TABS (METOPROLOL TARTRATE) One tablet by mouth twice a day  #60 x 3   Entered and Authorized by:   Tawanna Cooler McDiarmid MD   Signed by:   Tawanna Cooler McDiarmid MD on 01/31/2010   Method used:   Electronically to        [id number] (retail)       [address] [place], [place]  [address]       Ph: [phone]       Fax: [phone]   RxID:   [id number] NITROSTAT 0.6 MG SUBL (NITROGLYCERIN) 1 tablet sublingually every 5 minutes as needed for chest pain.  Call emergency squad if no relief by third tablet.  #25 x 1   Entered and Authorized by:   Todd McDiarmid  MD   Signed by:   Tawanna Cooler McDiarmid MD on 01/31/2010   Method used:   Electronically to        [id number]* (retail)       [address] [place], [place]  [address]       Ph: [phone]       Fax:  [phone]   RxID:   [id number] POTASSIUM CHLORIDE CR 10 MEQ CR-TABS (POTASSIUM CHLORIDE) Take two tablets by mouth at bedtime  #60 x 11   Entered and Authorized by:   Tawanna Cooler McDiarmid MD   Signed by:   Tawanna Cooler McDiarmid MD on 01/31/2010   Method used:   Electronically to        [id number]* (retail)       [address] [place], [place]  [address]       Ph: [phone]       Fax: [phone]   RxID:   [id number] OMEPRAZOLE 20 MG TBEC (OMEPRAZOLE) Take 1 tab by mouth twice a day  #60 x 5   Entered and Authorized by:   Tawanna Cooler McDiarmid MD   Signed by:   Tawanna Cooler McDiarmid MD on 01/31/2010   Method used:   Electronically to        [id number]* (retail)       [address] [place], [place]  [address]       Ph: [phone]       Fax: [phone]   RxID:   [id number]   Appended Document: [facility] update    Clinical Lists Changes  Observations: Added new observation of LIPSMSUPPACT: Good outcomes (02/02/2010 9:19) Added new observation of LIPID PROGRS: At goal (02/02/2010 9:19) Added new observation of LIPID FSREVW: Yes (02/02/2010 9:19) Added new observation of HTNSMSUPP: Written self-care plan, Education handout, Pre-printed educational material (02/02/2010 9:19) Added new observation of HTN PROGRESS: Deteriorated (02/02/2010 9:19) Added new observation of HTN FSREVIEW: Yes (02/02/2010 9:19) Added new observation of DM PROGRESS: N/A (02/02/2010 9:19) Added new observation of DM FSREVIEW: N/A (02/02/2010 9:19)       Prevention & Chronic Care Immunizations   Influenza vaccine: given  (08/10/2009)   Influenza vaccine due: 08/10/2010    Tetanus booster: 03/06/2002: Done.   Tetanus booster due: 03/06/2012    Pneumococcal vaccine: Not documented  Colorectal Screening   Hemoccult: refused  (09/10/2008)   Hemoccult due: Not Indicated    Colonoscopy: Hyperplastic Polyp  (01/23/2008)   Colonoscopy due: 01/22/2018  Other  Screening   Pap smear: refused  (11/12/2008)   Pap smear due: Not Indicated    Mammogram: Normal  (05/14/2009)   Mammogram due: 05/2011   Smoking status: quit  (01/31/2010)  Lipids   Total Cholesterol: 202  (08/02/2009)   Lipid panel action/deferral: Lipid Panel ordered   LDL: 98  (08/02/2009)   LDL Direct: 108  (12/24/2008)   HDL: 47  (08/02/2009)   Triglycerides: 287  (08/02/2009)    SGOT (AST): 32  (01/26/2010)   BMP action: Ordered   SGPT (ALT): 46  (01/26/2010)   Alkaline phosphatase: 53  (01/26/2010)   Total bilirubin: 0.4  (01/26/2010)    Lipid flowsheet reviewed?: Yes   Progress toward LDL goal: At goal  Hypertension   Last Blood Pressure: 158 / 92  (01/31/2010)   Serum creatinine: 0.77  (01/28/2010)   Serum potassium 4.0  (  01/28/2010)    Hypertension flowsheet reviewed?: Yes   Progress toward BP goal: Deteriorated  Self-Management Support :   Personal Goals (by the next clinic visit) :      Personal blood pressure goal: 140/90  (08/03/2009)     Personal LDL goal: 130  (11/08/2009)    Hypertension self-management support: Written self-care plan, Education handout, Pre-printed educational material  (02/02/2010)   Hypertension self-care plan printed.   Hypertension education handout printed    Hypertension self-management support not done because: Not indicated  (01/05/2010)    Lipid self-management support: Lipid monitoring log, Education handout, Written self-care plan  (08/02/2009)     Lipid self-management support not done because: Good outcomes  (02/02/2010)

## 2010-12-06 NOTE — Progress Notes (Signed)
Summary: phn msg  Phone Note Call from Patient Call back at Home Phone [phone]   Caller: Patient Summary of Call: pt thinks she can work more than 4 hrs but not more than 4.5 - needs a note stating this to take to work Initial call taken by: De Nurse,  May 13, 2010 3:27 PM  Follow-up for Phone Call        Ms [name] may pick up her letter for work from Dr Yessika Otte at  the [facility] front desk Follow-up by: Tawanna Cooler Gwenlyn Hottinger MD,  May 16, 2010 12:17 PM     Appended Document: phn msg Pt informed and agreeable

## 2010-12-06 NOTE — Progress Notes (Signed)
Summary: phn msg  Phone Note Call from Patient Call back at Home Phone [phone]   Caller: Patient Summary of Call: pt concerned b/c she lives with her son and he noticed that she stops breathing during the night.  concerned and not sure what to do.  pls advise Initial call taken by: De Nurse,  September 19, 2010 8:33 AM  Follow-up for Phone Call        Pt sts that her son said she stops breathing @ night and snores.  Advised she should come in for a visit to discuss possible sleep apnea referral.  Pt agreeable. She will call back to schedule appt. Follow-up by: Jone Baseman CMA,  September 19, 2010 10:00 AM

## 2010-12-06 NOTE — Assessment & Plan Note (Signed)
Summary: fu/kh   Vital Signs:  Patient profile:   60 year old female Height:      64 inches Weight:      251 pounds BMI:     43.24 BSA:     2.16 Temp:     97.8 degrees F Pulse rate:   115 / minute BP sitting:   130 / 84  Vitals Entered By: [name] CMA (March 03, 2010 10:46 AM)  Nutrition Counseling: Patient's BMI is greater than 25 and therefore counseled on weight management options. CC: f/u Is Patient Diabetic? No Pain Assessment Patient in pain? no        Primary Care Provider:  Lynze Reddy MD  CC:  f/u.  History of Present Illness: Chronic Pain Syndrome Adequate pain control and restless leg symptoms currently.   She has recently returned to work as Conservation officer, nature at [employer].She is working 4 to 5 hours a day. beginning of January, primarily due to worsening of her Major Depression and her chest pain.  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   She has not had to use as frequent oxycodone for breakthrough pain since she has not been working.  Taking Oxycodone 5 mg tab, between 2 tabs a day most days with occassional use up to 6 tablets a day on days she works. She is Taking MS Contin 15 mg two times a day Constipation has been a problem.  She manages it with colace 2 tablets once a day  She is not currently interested is decreasing or stopping MS Contin or oxycodone  Depression [name] has used her EMSAM patch only twice in the last two weeks because of the inflammatory skin reaction to the patches.  She report a bullous reaction to the initial attempt. She is not on Prozac anymore.  She cries easily.  Labile moods.  No thoughts of suicide or self harm. She has been eating more.  Her weight gain is a source of sadness and frustration.      HYPERTENSION Disease Monitoring   Blood pressure range:not checking at home.  It was 108/70 with HR 64 at her OV with [name] (Card) on 02/03/10.    Chest pain:none  Dyspnea:no   Claudication:no  Medications   Compliance:yes. taking metoprolol 50 mg two times a day until last week when she decreased ot 50 mg once daily to conserve tablets.  She took one this morning.  Side effects   Lightheadedness:none   Urinary frequency:none   Edema:none     Prevention   Exercise:Not exercising   Diet pattern:Eating to comfort self      Habits & Providers  Alcohol-Tobacco-Diet     Alcohol drinks/day: 0     Tobacco Status: quit  Current Medications (verified): 1)  Hydrochlorothiazide 25 Mg Tabs (Hydrochlorothiazide) .... Take 1 Tablet By Mouth Once A Day 2)  Metoprolol Tartrate 50 Mg Tabs (Metoprolol Tartrate) .... One Tablet By Mouth Twice A Day 3)  Altace 10 Mg Caps (Ramipril) .... Take 1 Capsule By Mouth Once A Day 4)  Nitrostat 0.6 Mg Subl (Nitroglycerin) .Marland Kitchen.. 1 Tablet Sublingually Every 5 Minutes As Needed For Chest Pain.  Call Emergency Squad If No Relief By Third Tablet. 5)  Potassium Chloride Cr 10 Meq Cr-Tabs (Potassium Chloride) .... Take Two Tablets By Mouth At Bedtime 6)  Aspir-Low 81 Mg Tbec (Aspirin) .Marland Kitchen.. 1 Tablet By Mouth Daily 7)  Synthroid 50 Mcg Tabs (Levothyroxine Sodium) .Marland KitchenMarland KitchenMarland Kitchen  2  Tablet Bys Mouth Once A Day 8)  Prozac 20 Mg Caps (Fluoxetine Hcl) .Marland Kitchen.. 1 Capsule By Mouth Daily 9)  Ativan 1 Mg Tabs (Lorazepam) .Marland Kitchen.. 1 Tablet By Mouth Three Times A  Day and Two Tablets At Bedtime. Prescribed By Gabriella. Betti Cruz 10)  Hydroxyzine Hcl 50 Mg Tabs (Hydroxyzine Hcl) .... 3 Tab By Mouth At Bedtime 11)  Bentyl 20 Mg Tabs (Dicyclomine Hcl) .... Take 2 Tablet By Mouth Four Times A Day As Needed For Burping and Nausea 12)  Omeprazole 20 Mg Tbec (Omeprazole) .... Take 1 Tab By Mouth Twice A Day 13)  Prochlorperazine Maleate 10 Mg  Tabs (Prochlorperazine Maleate) .Marland Kitchen.. 1 Tablet Qid As Needed Nausea or Vomiting 14)  Ms Contin 15 Mg Xr12h-Tab (Morphine Sulfate) .Marland Kitchen.. 1 Tablet By Mouth Every 12 Hours. Disp: 60.  Fill On or After 02/06/2010. 15)  Oxycodone Hcl 5 Mg  Tabs (Oxycodone Hcl) .... 2 Tablets By Mouth Twice A Day As Needed For Breakthrough Pain. Disp: 120. Fill On or After 02/06/2010. 16)  Colace 100 Mg  Caps (Docusate Sodium) .... 4  Capsule At Bedtime 17)  Calcium 600/vitamin D 600-400 Mg-Unit  Tabs (Calcium Carbonate-Vitamin D) .Marland Kitchen.. 1 Tablet A Day By Mouth Twice A Day 18)  Vitamin D3 400 Unit Tabs (Cholecalciferol) .Marland Kitchen.. 1 Tablet By Mouth Daily 19)  Cvs Pain Relief Extra Strength 500 Mg  Tabs (Acetaminophen) .... 2 Tablets Three To Four Times A Day, As Needed For Osteoarthritis Pain. 20)  Allegra 180 Mg Tabs (Fexofenadine Hcl) .... Take 1 Tablet By Mouth Once A Day 21)  Triamcinolone Acetonide 0.1 % Lotn (Triamcinolone Acetonide) .... Rub Iin Well To Area Where Patch Is To Be Applied. Disp: 60 Gram.  Refill: 1  Allergies (verified): 1)  ! Arthrotec 50 (Diclofenac-Misoprostol) 2)  Naprosyn (Naproxen) 3)  Paxil (Paroxetine Hcl) 4)  Prednisone (Prednisone) 5)  * Propulcid 6)  Vivelle 7)  * Phenelzine 8)  Aspirin 9)  * Abilify  Past History:  Past medical history reviewed for relevance to current acute and chronic problems.  Past Medical History: Reviewed history from 01/31/2010 and no changes required. Prediabetes Obesity Hx of Community-acquired MRSA Skin infection(2007), Hx of Left-sided Sacroiliitis HxRight Hip Trochanteric Bursitis (05/06) MRI-Lumbar(08/03):GeneralDDD;L2-3Lg Ant HNP. Mod pos  hnp - 12/14/2005, smhnpw/irritL4root - 12/14/2005 Lumbar MRI (12/14/2005) with contrast: L3-4 Moderate size HNP with biforaminal protrusion - possible L3 root irritation, Left hip osteoarthritis with reactive effusion, 2008  Multiple Medication Sensitivities Intolerant of Paxil, propulcid, habitrol, welbutrin   Irritable Bowel Syndrome- Mixed Diarrhea and Constipation  Cardic Angiocath & ventriculogram (01/27/10, [name]): Results: Widely patent coronary arteries, Normal left ventricular function.  Possible left ventricular  hypertrophy, Chest  pain was probably noncardiac in origin, Abnormal nuclear study, felt to be false positive due to breast attenuation artifact on a 2-day protocol.  Past Surgical History: Reviewed history from 01/31/2010 and no changes required. Bladder Tack `98, [place] - 04/03/2002,   Bunionectomy, bilaterally with screws  Laparoscopy for Ectopic pregnancy   Colonoscopy 5/04 WNL Gabriella. [address] - 04/02/2003  Cystocele and rectocele repair 3/98, [place] -  EGD Left Hip fluoroscopically-guided corticosteorid injection for painful osteoarthritis with good effect (06/2007) Exploratory laparotomy Facial Laceration repair as adolescent  Hysterectomy & RSO  Physical Exam  General:  alert.  obese. Well groomed. Lungs:  normal respiratory effort, no intercostal retractions, and no accessory muscle use.   Heart:  normal rate, regular rhythm, and no murmur.   Psych:  memory intact  for recent and remote, normally interactive, and good eye contact.  Occassional tearfulness but quickly improves with distraction.    Impression & Recommendations:  Problem # 1:  HYPERTENSION, BENIGN SYSTEMIC (ICD-401.1) Assessment Improved  Adequate control. Tolerating medication. No new organ damage. Plan to continue current medication.  Her updated medication list for this problem includes:    Hydrochlorothiazide 25 Mg Tabs (Hydrochlorothiazide) .Marland Kitchen... Take 1 tablet by mouth once a day    Metoprolol Tartrate 50 Mg Tabs (Metoprolol tartrate) ..... One tablet by mouth twice a day    Altace 10 Mg Caps (Ramipril) .Marland Kitchen... Take 1 capsule by mouth once a day  Orders: [facility]- Est Level  3 (99213) [facility]- Est  Level 4 (13244)  Problem # 2:  VITAMIN D DEFICIENCY (ICD-268.9) Screen for Vitamin D deficiency Orders: Vit D, 25 [facility] ([address]-25366)  Problem # 3:  DEPRESSION, MAJOR, RECURRENT (ICD-296.30) Assessment: Deteriorated  [name] is going to try applying triamcinolone lotion to area of skin were she will then apply the EMSAM patch.  She is also  going to try rotating the patch multiple times a day in order to avoid the contact skin dermatitis to the patch that has prevented her from being able to take this effective antidepressant for her.  She will see Gabriella Betti Cruz (Psych) soon.  They are considering a trial of Adderal as an adjuvant antidepressant therapy.  Given her patent Coronary arteries on recent coronary angiogram I am relatively comfortable with a trial should they choose to try it.   Orders: [facility]- Est Level  3 (99213) [facility]- Est  Level 4 (99214)  Problem # 4:  CHRONIC PAIN SYNDROME (ICD-338.4) Adequate pain control. Only adverse effect of constipation that patient is able to handle with diet and colace. No abberant behaviors noted.  Patient feels that potassium 20 mEq supplement at bedtime decreases her nighttime Restless Leg symptoms.   Three Rx(handwritten) for MS Contin 60 tablets to fill 03/08/10, 04/07/10, and 05/07/10 given to patient Three Rx (handwritten) for Oxycodone 120 tablet to fill 03/08/10, 04/07/10, and 05/07/10 given to patient.  RTC in 3  months to review effectiveness and side effects of chronic opiate therapy.   Complete Medication List: 1)  Hydrochlorothiazide 25 Mg Tabs (Hydrochlorothiazide) .... Take 1 tablet by mouth once a day 2)  Metoprolol Tartrate 50 Mg Tabs (Metoprolol tartrate) .... One tablet by mouth twice a day 3)  Altace 10 Mg Caps (Ramipril) .... Take 1 capsule by mouth once a day 4)  Nitrostat 0.6 Mg Subl (Nitroglycerin) .Marland Kitchen.. 1 tablet sublingually every 5 minutes as needed for chest pain.  call emergency squad if no relief by third tablet. 5)  Potassium Chloride Cr 10 Meq Cr-tabs (Potassium chloride) .... Take two tablets by mouth at bedtime 6)  Aspir-low 81 Mg Tbec (Aspirin) .Marland Kitchen.. 1 tablet by mouth daily 7)  Synthroid 50 Mcg Tabs (Levothyroxine sodium) .... 2  tablet bys mouth once a day 8)  Prozac 20 Mg Caps (Fluoxetine hcl) .Marland Kitchen.. 1 capsule by mouth daily 9)  Ativan 1 Mg Tabs (Lorazepam) .Marland Kitchen.. 1 tablet by  mouth three times a  day and two tablets at bedtime. prescribed by Gabriella. reddy 10)  Hydroxyzine Hcl 50 Mg Tabs (Hydroxyzine hcl) .... 3 tab by mouth at bedtime 11)  Bentyl 20 Mg Tabs (Dicyclomine hcl) .... Take 2 tablet by mouth four times a day as needed for burping and nausea 12)  Omeprazole 20 Mg Tbec (Omeprazole) .... Take 1 tab by mouth twice a day  13)  Prochlorperazine Maleate 10 Mg Tabs (Prochlorperazine maleate) .Marland Kitchen.. 1 tablet qid as needed nausea or vomiting 14)  Colace 100 Mg Caps (Docusate sodium) .... 4  capsule at bedtime 15)  Calcium 600/vitamin D 600-400 Mg-unit Tabs (Calcium carbonate-vitamin d) .Marland Kitchen.. 1 tablet a day by mouth twice a day 16)  Vitamin D3 400 Unit Tabs (Cholecalciferol) .Marland Kitchen.. 1 tablet by mouth daily 17)  Cvs Pain Relief Extra Strength 500 Mg Tabs (Acetaminophen) .... 2 tablets three to four times a day, as needed for osteoarthritis pain. 18)  Allegra 180 Mg Tabs (Fexofenadine hcl) .... Take 1 tablet by mouth once a day 19)  Triamcinolone Acetonide 0.1 % Lotn (Triamcinolone acetonide) .... Rub iin well to area where patch is to be applied. disp: 60 gram.  refill: 1 20)  Ms Contin 15 Mg Xr12h-tab (Morphine sulfate) .Marland Kitchen.. 1 tablet by mouth every 12 hours. disp: 60.  fill on or after 03/08/2010. 21)  Ms Contin 15 Mg Xr12h-tab (Morphine sulfate) .Marland Kitchen.. 1 tablet by mouth every 12 hours. disp:60. fill on of after 04/07/10 22)  Ms Contin 15 Mg Xr12h-tab (Morphine sulfate) .Marland Kitchen.. 1 tablet by mouth every 12 hours. disp:60. fill on of after 05/07/10 23)  Oxycodone Hcl 5 Mg Tabs (Oxycodone hcl) .... 2 tablets by mouth twice a day as needed for breakthrough pain. disp: 120. fill on or after 03/08/2010. 24)  Oxycodone Hcl 5 Mg Tabs (Oxycodone hcl) .... 2 tablets by mouth twice a day as needed for breakthrough pain. disp: 120. fill on or after 04/07/2010. 25)  Oxycodone Hcl 5 Mg Tabs (Oxycodone hcl) .... 2 tablets by mouth twice a day as needed for breakthrough pain. disp: 120. fill on or after  05/07/2010.  Other Orders: [facility] [phone]) Free [facility] [phone])  Patient Instructions: 1)  Please schedule a follow-up appointment in 3 months .  2)  Rub triamcinolone lotion into area of skin where you will apply Emsam patch. 3)  Remove patch if you develop nausea, headache, tremors, muscle tightness.  Prescriptions: OXYCODONE HCL 5 MG TABS (OXYCODONE HCL) 2 tablets by mouth twice a day as needed for breakthrough pain. Disp: 120. Fill on or after 05/07/2010.  #120 x 0   Entered and Authorized by:   Tawanna Cooler Hillery Bhalla MD   Signed by:   Tawanna Cooler Ahjanae Cassel MD on 03/03/2010   Method used:   Handwritten   RxID:   [id number] OXYCODONE HCL 5 MG TABS (OXYCODONE HCL) 2 tablets by mouth twice a day as needed for breakthrough pain. Disp: 120. Fill on or after 04/07/2010.  #120 x 0   Entered and Authorized by:   Tawanna Cooler Shanara Schnieders MD   Signed by:   Tawanna Cooler Aprel Egelhoff MD on 03/03/2010   Method used:   Handwritten   RxID:   [id number] OXYCODONE HCL 5 MG TABS (OXYCODONE HCL) 2 tablets by mouth twice a day as needed for breakthrough pain. Disp: 120. Fill on or after 03/08/2010.  #120 x 0   Entered and Authorized by:   Tawanna Cooler Taten Merrow MD   Signed by:   Tawanna Cooler Zahki Hoogendoorn MD on 03/03/2010   Method used:   Handwritten   RxID:   [id number] MS CONTIN 15 MG XR12H-TAB (MORPHINE SULFATE) 1 tablet by mouth every 12 hours. Disp:60. Fill on of after 05/07/10  #60 x 0   Entered and Authorized by:   Tawanna Cooler Kadian Barcellos MD   Signed by:   Tawanna Cooler Alger Kerstein MD on 03/03/2010   Method used:   Handwritten   RxID:  [id number] MS CONTIN 15 MG XR12H-TAB (MORPHINE SULFATE) 1 tablet by mouth every 12 hours. Disp:60. Fill on of after 04/07/10  #60 x 0   Entered and Authorized by:   Tawanna Cooler Jarrel Knoke MD   Signed by:   Tawanna Cooler Izzah Pasqua MD on 03/03/2010   Method used:   Handwritten   RxID:   [id number] TRIAMCINOLONE ACETONIDE 0.1 % LOTN (TRIAMCINOLONE ACETONIDE) Rub iin well to area where patch is to be applied. Disp: 60 gram.   Refill: 1  #1 x 1   Entered and Authorized by:   Tawanna Cooler Buzz Axel MD   Signed by:   Tawanna Cooler Pollyann Roa MD on 03/03/2010   Method used:   Electronically to        [id number]* (retail)       [address] [place], [place]  [address]       Ph: [phone]       Fax: [phone]   RxID:   [id number]    Prevention & Chronic Care Immunizations   Influenza vaccine: given  (08/10/2009)   Influenza vaccine due: 08/10/2010    Tetanus booster: 03/06/2002: Done.   Tetanus booster due: 03/06/2012    Pneumococcal vaccine: Not documented  Colorectal Screening   Hemoccult: refused  (09/10/2008)   Hemoccult due: Not Indicated    Colonoscopy: Hyperplastic Polyp  (01/23/2008)   Colonoscopy due: 01/22/2018  Other Screening   Pap smear: refused  (11/12/2008)   Pap smear due: Not Indicated    Mammogram: Normal  (05/14/2009)   Mammogram due: 05/2011   Smoking status: quit  (03/03/2010)  Lipids   Total Cholesterol: 202  (08/02/2009)   Lipid panel action/deferral: Lipid Panel ordered   LDL: 98  (08/02/2009)   LDL Direct: 108  (12/24/2008)   HDL: 47  (08/02/2009)   Triglycerides: 287  (08/02/2009)    SGOT (AST): 32  (01/26/2010)   BMP action: Ordered   SGPT (ALT): 46  (01/26/2010)   Alkaline phosphatase: 53  (01/26/2010)   Total bilirubin: 0.4  (01/26/2010)    Lipid flowsheet reviewed?: Yes   Progress toward LDL goal: At goal  Hypertension   Last Blood Pressure: 130 / 84  (03/03/2010)   Serum creatinine: 0.77  (01/28/2010)   Serum potassium 4.0  (01/28/2010)    Hypertension flowsheet reviewed?: Yes   Progress toward BP goal: At goal  Self-Management Support :   Personal Goals (by the next clinic visit) :      Personal blood pressure goal: 140/90  (08/03/2009)     Personal LDL goal: 130  (11/08/2009)    Hypertension self-management support: Written self-care plan, Education handout, Pre-printed educational material  (02/02/2010)    Hypertension  self-management support not done because: Good outcomes  (03/03/2010)    Lipid self-management support: Lipid monitoring log, Education handout, Written self-care plan  (08/02/2009)     Lipid self-management support not done because: Good outcomes  (03/03/2010)  Appended Document: Vitamin D deficiency and Hypothroidism    Clinical Lists Changes  Problems: Reactivated problem of VITAMIN D DEFICIENCY (ICD-268.9) Assessed VITAMIN D DEFICIENCY as new Medications: Added new medication of D3-50 50000 UNIT CAPS (CHOLECALCIFEROL) One capsule by mouth weekly for 8 weeks - Signed Changed medication from SYNTHROID 50 MCG TABS (LEVOTHYROXINE SODIUM) 2  tablet bys mouth once a day to SYNTHROID 50 MCG TABS (LEVOTHYROXINE SODIUM) 2 and half  tablet by mouth once a day Rx of D3-50 50000 UNIT CAPS (CHOLECALCIFEROL) One capsule by  mouth weekly for 8 weeks;  #8 x 0;  Signed;  Entered by: Tawanna Cooler Dainel Arcidiacono MD;  Authorized by: Tawanna Cooler Dreamer Carillo MD;  Method used: Electronically to [facility] #[id number]*, [address], [place], [place]  [address], Ph: [phone], Fax: [phone]    Prescriptions: D3-50 50000 UNIT CAPS (CHOLECALCIFEROL) One capsule by mouth weekly for 8 weeks  #8 x 0   Entered and Authorized by:   Tawanna Cooler Khiya Friese MD   Signed by:   Tawanna Cooler Ramatoulaye Pack MD on 03/04/2010   Method used:   Electronically to        [id number]* (retail)       [address] [place], [place]  [address]       Ph: [phone]       Fax: [phone]   RxID:   [id number]

## 2010-12-06 NOTE — Discharge Summary (Signed)
Summary: Discharge Summary 3/22 - 01/28/10  NAME:  [name], [name]                ACCOUNT NO.:  [id number]      MEDICAL RECORD NO.:  [id number]          PATIENT TYPE:  INP      LOCATION:  3708                         FACILITY:  [facility]      PHYSICIAN:  Lyn Records, M.D.   DATE OF BIRTH:  [birth year]      DATE OF ADMISSION:  01/26/2010   DATE OF DISCHARGE:  01/28/2010                                  DISCHARGE SUMMARY      REASON FOR ADMISSION TO THE HOSPITAL:  Prolonged chest pain and abnormal   Cardiolite study.      DISCHARGE DIAGNOSES:   1. Chest pain of uncertain etiology.  Coronary artery disease was       excluded by coronary angiography, which demonstrated widely patent       coronary vessels and normal left ventricular systolic function with       a question of left ventricular hypertrophy.  The etiology of chest       pain is uncertain at this time and includes a wide differential       including the possibility of musculoskeletal syndrome in this       patient with a chronic pain syndrome, gastrointestinal, aortic,       pulmonary, and psychogenic.   2. Anxiety.   3. Fibromyalgia.   4. Restless legs syndrome.   5. Hypertension.   6. Gastroesophageal reflux.   7. Chronic obstructive pulmonary disease.   8. Lactose intolerance.   9. Osteoarthritis.      PROCEDURES PERFORMED:  Diagnostic coronary angiography by Dr. Verdis Prime on January 27, 2010.      DISCHARGE INSTRUCTIONS:   1. The patient is to gradually increase physical activity and may       return to work on January 30, 2010.   2. Her medication regimen is unchanged from that which she was taking       on admission with the exception of aspirin 81 mg, which has been       added.   3. She is to call if any pain or swelling in her groin.   4. She has an office follow up with Cristopher Peru, nurse       practitioner at [facility] on 2:30 p.m. on February 01, 2010.   5. She is to follow up with her primary care  physicians for further       evaluation of chest pain.  Further diagnostic testing may       potentially include a GI workup, pulmonary/aortic angiogram to rule       out pulmonary emboli and/or aortic disease, and       psychoanalysis/biofeedback.   6. The patient's activity can be increased as tolerated.      CONDITION ON DISCHARGE:  Improved.      HISTORY/PHYSICAL AND HOSPITAL COURSE:  The patient has had a relatively   short crescendo pattern of increasing spontaneous episodes of chest pain   that are poorly characterized.  EKG over  the past year has began to   demonstrate anterior T-wave inversion.      Because of this patient was referred by Dr. Perley Jain from the [facility] for risk medication.  After consultation, I agree that   some form of risk stratification to rule out coronary disease was   needed.  She underwent a 2-day nuclear study in our office earlier this   week that was abnormal and suggested anteroseptal ischemia.  On the   second day of the study, the patient developed chest pain that lead me   to admit her from my office.  Despite prolonged chest pain, there were   no acute EKG changes and all cardiac markers were negative.      The following morning, January 27, 2010, coronary angiography was   performed and no significant obstructive lesion was found and any   significantly sized epicardial coronary artery.  LV function was normal,   but there was a question of LVH.      The patient developed a relatively small groin bleed 2 hours post sheath   removal.  The hematoma was expressed by the Catheterization Laboratory   and on the morning of discharge, the groin is very soft, although there   is ecchymosis.  She has a good pulse and there is no difficulty with   distal circulation.  Creatinine is less than 1.0.  Hemoglobin is 10 down   from 11.5.  The decrease in hemoglobin is felt to be predominantly due   to overnight hydration.  I do not believe  that she lost much blood in   her right groin.  Platelet count was normal at greater than 200,000.      As stated above, if she continues to have discomfort, further workup to   include echocardiogram, CT angio of the chest to rule out aortic disease   and pulmonary emboli, and perhaps consideration of GI workup could be   helpful.               Lyn Records, M.D.            HWS/MEDQ  D:  01/28/2010  T:  01/29/2010  Job:  [id number]

## 2010-12-06 NOTE — Progress Notes (Signed)
Summary: triage  Phone Note Call from Patient Call back at Home Phone [phone]   Caller: Patient Summary of Call: Pt throwing up and is dizzy. Initial call taken by: Clydell Hakim,  Mar 29, 2010 12:17 PM  Follow-up for Phone Call        started 15 minutes ago. was asleep & had legs propped up. then woke up & felt dizzy. went to void & then started vomiting. sipping on water makes her vomit agagin. declined appt. suggested npo x 30 minutes, then take small sips every 5 minutes as tolerated. call back if still vomiting for an appt. she is going to see if she feels better at home Follow-up by: Golden Circle RN,  Mar 29, 2010 12:18 PM  Additional Follow-up for Phone Call Additional follow up Details #1::        states she felt better within an hour. ate crushed ice. still feels weak & dizzy when she stands. wants to come in tomorrow. appt at 11 with Dr. Burnadette Pop. she is aware she will not be seeing her pcp. asked her to bring all med bottles with her. told her we have a md on call if anything arises during the night. if she becomes much worse may call 911 & go to ED. she agreed with this plan Additional Follow-up by: Golden Circle RN,  Mar 29, 2010 4:39 PM

## 2010-12-06 NOTE — Progress Notes (Signed)
Summary: phn msg  Phone Note Call from Patient Call back at [facility] Phone [phone]   Caller: Patient Summary of Call: Pt is going to have catherization tomorrow at [facility] with possible stent.  Dr. Verdis Prime is the doctor. Pt is being put on baby aspirin and nytroglcerin. Initial call taken by: Clydell Hakim,  January 26, 2010 2:36 PM

## 2010-12-06 NOTE — Progress Notes (Signed)
Summary: refill  Phone Note Refill Request Call back at Home Phone [phone] Message from:  Patient  Refills Requested: Medication #1:  ALTACE 10 MG CAPS Take 1 capsule by mouth once a day   Dosage confirmed as above?Dosage Confirmed   Supply Requested: 3 months  Medication #2:  HYDROCHLOROTHIAZIDE 25 MG TABS Take 1 tablet by mouth once a day   Dosage confirmed as above?Dosage Confirmed   Supply Requested: 3 months  Medication #3:  ALLEGRA 180 MG TABS Take 1 tablet by mouth once a day   Dosage confirmed as above?Dosage Confirmed   Supply Requested: 3 months needs written and would like for her sister, [name], who has an appt tomorrow - would like for you to give them to her to give to pt so she can mail them off.  Initial call taken by: De Nurse,  November 10, 2009 12:10 PM  Follow-up for Phone Call        to pcp Follow-up by: Golden Circle RN,  November 10, 2009 12:11 PM    Prescriptions: HYDROCHLOROTHIAZIDE 25 MG TABS (HYDROCHLOROTHIAZIDE) Take 1 tablet by mouth once a day  #90 x 3   Entered and Authorized by:   Tawanna Cooler Yatzil Clippinger MD   Signed by:   Tawanna Cooler Gearld Kerstein MD on 11/11/2009   Method used:   Print then Give to Patient   RxID:   [id number] ALTACE 10 MG CAPS (RAMIPRIL) Take 1 capsule by mouth once a day  #90 x 3   Entered and Authorized by:   Tawanna Cooler Carley Glendenning MD   Signed by:   Tawanna Cooler Atreyu Mak MD on 11/11/2009   Method used:   Print then Give to Patient   RxID:   [id number] ALLEGRA 180 MG TABS (FEXOFENADINE HCL) Take 1 tablet by mouth once a day  #90 x 3   Entered and Authorized by:   Tawanna Cooler Vimal Derego MD   Signed by:   Tawanna Cooler Rasmus Preusser MD on 11/11/2009   Method used:   Print then Give to Patient   RxID:   [id number]  Will give these handwritten prescriptions to patient's sister, [name], when [name] comes into office for OV today.  Rose will give Rxs to [employer].

## 2010-12-06 NOTE — Assessment & Plan Note (Signed)
Summary: f/u meds/bmc   Vital Signs:  Patient profile:   60 year old female Height:      64 inches Weight:      234 pounds BMI:     40.31 Temp:     97.8 degrees F oral Pulse rate:   80 / minute BP sitting:   128 / 76  (left arm) Cuff size:   regular  Vitals Entered By: Garen Grams LPN (August 29, 2010 11:27 AM) CC: f/u meds Is Patient Diabetic? No Pain Assessment Patient in pain? yes     Location: knees   Primary Care Provider:  Keilan Nichol MD  CC:  f/u meds.  History of Present Illness: Chronic Pain Syndrome. Less pain in hips now, more pain in bilateral knees.   She has increased her stool softner to 4 to 5 capsules a day and began once a week Miralax for constipation.  This has helped though not cured her constipation.  Denieis confusion, incoordination, GI upset. 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  She received an Human resources officer of the Month award for her work at [employer].   She reports pain relief since last visit using MS Contin and oxycodone is 90%. Today Pain has interfered with General activity 7 times in last 24 hours Pain has intergered with Mood 1 times in last 24 hours Pain has interfered with Ability to work (in or out of home) 7 times in last 24 hours Pain has interfered with interactions with other people 3 times in last 24 hours Pain has interfered with Sleep 2  times in last 24 hours. Pain has interfered with Enjoyment of lif 4 times in last 24 hours.   05/30/10 office visit Pain has interfered with General activity 6 times in last 24 hours Pain has intergered with Mood 5 times in last 24 hours Pain has interfered with Ability to work (in or out of home) 4 times in last 24 hours Pain has interfered with interactions with other people 2 times in last 24 hours Pain has interfered with Sleep 5 times in last 24 hours. Pain has interfered with Enjoyment of lif 4 times in last 24 hours.   Depression Dr Betti Cruz started patient of Marlena Clipper   (Trazodone ER) 300 mg at bedtime.  Patient could not tolerate EMSAM patch because of skin rash reaction to adhesive of patch.  Denies excess sedation, agitation, palpitation, dizziness  HYPERTENSION Disease Monitoring   Blood pressure range:not checking at home   Chest pain:none   Dyspnea:none   Claudication:none  Medications   Compliance:taking ramipril, metoprolol and hctz will  Side effects   Lightheadedness:none   Urinary frequency:none   Edema:none     Prevention   Exercise:has not been exercising.  Exhgausted at end of work day.     Diet pattern: Right Diet shakes      Habits & Providers  Alcohol-Tobacco-Diet     Alcohol drinks/day: 0     Tobacco Status: quit     Tobacco Counseling: to remain off tobacco products     Cigarette Packs/Day: 1     Year Quit: 6/09  Current Medications (verified): 1)  Hydrochlorothiazide 25 Mg Tabs (Hydrochlorothiazide) .... Take 1 Tablet By Mouth Once A Day 2)  Metoprolol Tartrate 50 Mg Tabs (Metoprolol Tartrate) .... One Tablet By Mouth Twice A Day 3)  Altace 10 Mg Caps (Ramipril) .... Take 1 Capsule By Mouth Once A Day 4)  Nitrostat 0.6 Mg Subl (Nitroglycerin) .Marland Kitchen.. 1 Tablet Sublingually  Every 5 Minutes As Needed For Chest Pain.  Call Emergency Squad If No Relief By Third Tablet. 5)  Potassium Chloride Cr 10 Meq Cr-Tabs (Potassium Chloride) .... Take Two Tablets By Mouth At Bedtime 6)  Aspir-Low 81 Mg Tbec (Aspirin) .Marland Kitchen.. 1 Tablet By Mouth Daily 7)  Synthroid 50 Mcg Tabs (Levothyroxine Sodium) .... 3 Tablets By Mouth Once A Day 8)  Ativan 1 Mg Tabs (Lorazepam) .Marland Kitchen.. 1 Tablet By Mouth Three Times A  Day and Two Tablets At Bedtime. Prescribed By Dr. Betti Cruz 9)  Hydroxyzine Hcl 50 Mg Tabs (Hydroxyzine Hcl) .... 3 Tab By Mouth At Bedtime 10)  Bentyl 20 Mg Tabs (Dicyclomine Hcl) .... Take 2 Tablet By Mouth Four Times A Day As Needed For Burping and Nausea 11)  Omeprazole 20 Mg Tbec (Omeprazole) .... Take 1 Tab By Mouth Twice A Day 12)   Prochlorperazine Maleate 10 Mg  Tabs (Prochlorperazine Maleate) .Marland Kitchen.. 1 Tablet Qid As Needed Nausea or Vomiting 13)  Colace 100 Mg  Caps (Docusate Sodium) .... 4 To 5  Capsule At Bedtime 14)  Calcium 600/vitamin D 600-400 Mg-Unit  Tabs (Calcium Carbonate-Vitamin D) .Marland Kitchen.. 1 Tablet A Day By Mouth Twice A Day 15)  Cvs Pain Relief Extra Strength 500 Mg  Tabs (Acetaminophen) .... 2 Tablets Three To Four Times A Day, As Needed For Osteoarthritis Pain. 16)  Clarinex 5 Mg Tabs (Desloratadine) .... One Tablet By Mouth Every Other Day As Needed For Allergies 17)  Multivitamins   Tabs (Multiple Vitamin) .... One Daily 18)  Ms Contin 15 Mg Xr12h-Tab (Morphine Sulfate) .Marland Kitchen.. 1 Tablet By Mouth Every 12 Hours. Disp:60. Fill On of After 07/05/10 19)  Ms Contin 15 Mg Xr12h-Tab (Morphine Sulfate) .Marland Kitchen.. 1 Tablet By Mouth Every 12 Hours. Disp:60. Fill On of After 08/05/10 20)  Oxycodone Hcl 5 Mg Tabs (Oxycodone Hcl) .... 2 Tablets By Mouth Twice A Day As Needed For Breakthrough Pain. Disp: 120. Fill On or After 08/05/2010. 21)  Miralax  Powd (Polyethylene Glycol 3350) .... 2 Tablespoons Daily 22)  Oleptro 300 Mg Xr24h-Tab (Trazodone Hcl) .... One Tablet By Mouth Daily  Allergies (verified): 1)  ! Arthrotec 50 (Diclofenac-Misoprostol) 2)  Naprosyn (Naproxen) 3)  Paxil (Paroxetine Hcl) 4)  Prednisone (Prednisone) 5)  * Propulcid 6)  Vivelle 7)  * Phenelzine 8)  Aspirin 9)  * Abilify 10)  * Emsam (Selegiline Patch) PMH reviewed for relevance  Social History: Retired [place] [employer] worker - early retirement for mental health reasons. 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   Quit smoking tobacco June 09. multiple times previously No etoh  Quit Smoking marajhuana June 09, no other illicit drugs use Religious - [employer] Divorced  4 children  No IVDA  Physical Exam  General:  VS Reviewed. Obese, non illl appearing,  NAD. Pt able to ambulate and get on the exam table without difficulty  Lungs:  Normal respiratory effort, chest expands symmetrically. Lungs are clear to auscultation, no crackles or wheezes. Heart:  normal rate, regular rhythm, and no murmur.   Extremities:  trace left pedal edema and trace right pedal edema. (+)2 Dorsalis pedal pulses bilaterally  Psych:  memory intact for recent and remote, normally interactive, and good eye contact.   Well-groomed.    Impression & Recommendations:  Problem # 1:  HYPERTENSION, BENIGN SYSTEMIC (ICD-401.1)  Adequate control. Tolerating medication. No new organ damage. Plan to continue current medication.  Her updated medication list for this problem includes:    Hydrochlorothiazide 25 Mg Tabs (Hydrochlorothiazide) .Marland Kitchen... Take 1 tablet by mouth once a day    Metoprolol Tartrate 50 Mg Tabs (Metoprolol tartrate) ..... One tablet by mouth twice a day    Altace 10 Mg Caps (Ramipril) .Marland Kitchen... Take 1 capsule by mouth once a day  Orders: [facility]- Est  Level 4 (16109)  Problem # 2:  CHRONIC PAIN SYNDROME (ICD-338.4) Assessment: Improved  Adequate pain control. Only adverse effect of constipation that patient is able to handle with diet and colace. No abberant behaviors noted.  Pain is now less in hips and more in bilateral knees.  Encouraged use of up to 4 gram Acetaminophen in divided scheduled doses. Patient is intolerant of NSAIDS.  her mood is severely disturbed by steroids. She will let me know if the knee pain becomes severe enough that she desires to consult with orthopedics to discuss treatment options.   We do not have Knee X-rays/  Patient feels that potassium 20 mEq supplement at bedtime decreases her nighttime Restless Leg symptoms.   Three Rx(handwritten) for MS Contin 60 tablets to fill 09/04/10, 10/04/10, and 12/29/11given to patient Three Rx (handwritten) for Oxycodone 120 tablet to fill 09/04/10, 10/04/10, and 11/03/10 given to patient.  May  need to give another Rx for opiaites refill before next OV.   RTC in 3  months to review effectiveness and side effects of chronic opiate therapy.   Orders: [facility]- Est  Level 4 (60454)  Complete Medication List: 1)  Hydrochlorothiazide 25 Mg Tabs (Hydrochlorothiazide) .... Take 1 tablet by mouth once a day 2)  Metoprolol Tartrate 50 Mg Tabs (Metoprolol tartrate) .... One tablet by mouth twice a day 3)  Altace 10 Mg Caps (Ramipril) .... Take 1 capsule by mouth once a day 4)  Nitrostat 0.6 Mg Subl (Nitroglycerin) .Marland Kitchen.. 1 tablet sublingually every 5 minutes as needed for chest pain.  call emergency squad if no relief by third tablet. 5)  Potassium Chloride Cr 10 Meq Cr-tabs (Potassium chloride) .... Take two tablets by mouth at bedtime 6)  Aspir-low 81 Mg Tbec (Aspirin) .Marland Kitchen.. 1 tablet by mouth daily 7)  Synthroid 50 Mcg Tabs (Levothyroxine sodium) .... 3 tablets by mouth once a day 8)  Ativan 1 Mg Tabs (Lorazepam) .Marland Kitchen.. 1 tablet by mouth three times a  day and two tablets at bedtime. prescribed by dr. reddy 9)  Hydroxyzine Hcl 50 Mg Tabs (Hydroxyzine hcl) .... 3 tab by mouth at bedtime 10)  Bentyl 20 Mg Tabs (Dicyclomine hcl) .... Take 2 tablet by mouth four times a day as needed for burping and nausea 11)  Omeprazole 20 Mg Tbec (Omeprazole) .... Take 1 tab by mouth twice a day 12)  Prochlorperazine Maleate 10 Mg Tabs (Prochlorperazine maleate) .Marland Kitchen.. 1 tablet qid as needed nausea or vomiting 13)  Colace 100 Mg Caps (Docusate sodium) .... 4 to 5  capsule at bedtime 14)  Calcium 600/vitamin D 600-400 Mg-unit Tabs (Calcium carbonate-vitamin d) .Marland Kitchen.. 1 tablet a day by mouth twice a day 15)  Cvs Pain Relief Extra Strength 500 Mg Tabs (Acetaminophen) .... 2 tablets three to four times a day, as needed for osteoarthritis pain. 16)  Clarinex 5 Mg Tabs (Desloratadine) .... One tablet by mouth every other day as needed for allergies 17)  Multivitamins Tabs (Multiple vitamin) .... One daily 18)  Ms Contin 15 Mg  Xr12h-tab (Morphine sulfate) .Marland Kitchen.. 1 tablet by mouth every 12 hours. disp:60. fill on  of after 08/05/10 19)  Oxycodone Hcl 5 Mg Tabs (Oxycodone hcl) .... 2 tablets by mouth twice a day as needed for breakthrough pain. disp: 120. fill on or after 09/04/10 20)  Miralax Powd (Polyethylene glycol 3350) .... 2 tablespoons daily 21)  Oleptro 300 Mg Xr24h-tab (Trazodone hcl) .... One tablet by mouth daily 22)  Ms Contin 15 Mg Xr12h-tab (Morphine sulfate) .Marland Kitchen.. 1 tablet by mouth twice a day. fill on or after 10/04/10 23)  Ms Contin 15 Mg Xr12h-tab (Morphine sulfate) .Marland Kitchen.. 1 tablet by mouth twice a day. fill on or after 11/03/10. 24)  Oxycodone Hcl 5 Mg Tabs (Oxycodone hcl) .... 2 tablets by mouth twice a day as needed for breakthrough pain. disp: 120. fill on or after 10/04/10 25)  Oxycodone Hcl 5 Mg Tabs (Oxycodone hcl) .... 2 tablets by mouth twice a day as needed for breakthrough pain. disp: 120. fill on or after 11/03/10  Other Orders: [facility] ([id number])  Patient Instructions: 1)  Please schedule a follow-up appointment in 4 months .  2)  Good Job on your weight. 3)  Think about contacting [employer] about scholarship to get water aerobics. 4)  call Dr Tessa Seaberry with the name of your antidepressant.  5)  Remember to get your mammn=ogram. Prescriptions: OXYCODONE HCL 5 MG TABS (OXYCODONE HCL) 2 tablets by mouth twice a day as needed for breakthrough pain. Disp: 120. Fill on or after 11/03/10  #120 x 0   Entered and Authorized by:   Tawanna Cooler Chee Kinslow MD   Signed by:   Tawanna Cooler Ethel Veronica MD on 08/29/2010   Method used:   Handwritten   RxID:   [id number] OXYCODONE HCL 5 MG TABS (OXYCODONE HCL) 2 tablets by mouth twice a day as needed for breakthrough pain. Disp: 120. Fill on or after 10/04/10  #120 x 0   Entered and Authorized by:   Tawanna Cooler Tishina Lown MD   Signed by:   Rennie Hack MD on 08/29/2010   Method used:   Handwritten   RxID:   [id number] OXYCODONE HCL 5 MG TABS (OXYCODONE HCL) 2 tablets by  mouth twice a day as needed for breakthrough pain. Disp: 120. Fill on or after 09/04/10  #120 x 0   Entered and Authorized by:   Tawanna Cooler Kauan Kloosterman MD   Signed by:   Tawanna Cooler Amiaya Mcneeley MD on 08/29/2010   Method used:   Handwritten   RxID:   [id number] MS CONTIN 15 MG XR12H-TAB (MORPHINE SULFATE) 1 tablet by mouth twice a day. Fill on or after 11/03/10.  #60 x 0   Entered and Authorized by:   Tawanna Cooler Aveleen Nevers MD   Signed by:   Tawanna Cooler Finley Chevez MD on 08/29/2010   Method used:   Handwritten   RxID:   [id number] MS CONTIN 15 MG XR12H-TAB (MORPHINE SULFATE) 1 tablet by mouth twice a day. Fill on or after 10/04/10  #60 x 0   Entered and Authorized by:   Tawanna Cooler Zyah Gomm MD   Signed by:   Tawanna Cooler Yolando Gillum MD on 08/29/2010   Method used:   Handwritten   RxID:   [id number] ALTACE 10 MG CAPS (RAMIPRIL) Take 1 capsule by mouth once a day  #90 x 3   Entered and Authorized by:   Tawanna Cooler Toddy Boyd MD   Signed by:   Tawanna Cooler Lazaria Schaben MD on 08/29/2010   Method used:   Faxed to ...       [facility] (mail-order)             ,  [place]         Ph: [phone]       Fax: [phone]   RxID:   [id number] HYDROCHLOROTHIAZIDE 25 MG TABS (HYDROCHLOROTHIAZIDE) Take 1 tablet by mouth once a day  #90 x 3   Entered and Authorized by:   Tawanna Cooler Melony Tenpas MD   Signed by:   Tawanna Cooler Kyrie Fludd MD on 08/29/2010   Method used:   Faxed to ...       [facility] (mail-order)             , [place]         Ph: [phone]       Fax: [phone]   RxID:   [id number]    Orders Added: 1)  [facility] [phone] 2)  [facility]- Est  Level 4 [81448]     Prevention & Chronic Care Immunizations   Influenza vaccine: Historical  (07/22/2010)   Influenza vaccine due: 08/10/2010    Tetanus booster: 03/06/2002: Done.   Tetanus booster due: 03/06/2012    Pneumococcal vaccine: Not documented    H. zoster vaccine: Not documented  Colorectal Screening   Hemoccult: refused  (09/10/2008)   Hemoccult due: Not Indicated    Colonoscopy:  Hyperplastic Polyp  (01/23/2008)   Colonoscopy due: 01/22/2018  Other Screening   Pap smear: refused  (11/12/2008)   Pap smear due: Not Indicated    Mammogram: Normal  (05/14/2009)   Mammogram due: 05/2011    DXA bone density scan: Calcaneal Bone density measure by Dr Raymondo Band at [facility] with  T score > zero.   (05/06/2008)   DXA scan due: None    Smoking status: quit  (08/29/2010)  Lipids   Total Cholesterol: 202  (08/02/2009)   Lipid panel action/deferral: Lipid Panel ordered   LDL: 98  (08/02/2009)   LDL Direct: 108  (12/24/2008)   HDL: 47  (08/02/2009)   Triglycerides: 287  (08/02/2009)    SGOT (AST): 32  (01/26/2010)   BMP action: Ordered   SGPT (ALT): 46  (01/26/2010)   Alkaline phosphatase: 53  (01/26/2010)   Total bilirubin: 0.4  (01/26/2010)    Lipid flowsheet reviewed?: Yes   Progress toward LDL goal: At goal  Hypertension   Last Blood Pressure: 128 / 76  (08/29/2010)   Serum creatinine: 0.77  (01/28/2010)   Serum potassium 4.0  (01/28/2010)    Hypertension flowsheet reviewed?: Yes   Progress toward BP goal: At goal  Self-Management Support :   Personal Goals (by the next clinic visit) :      Personal blood pressure goal: 140/90  (08/03/2009)     Personal LDL goal: 130  (11/08/2009)    Hypertension self-management support: Written self-care plan, Education handout, Pre-printed educational material  (02/02/2010)    Hypertension self-management support not done because: Good outcomes  (08/29/2010)    Lipid self-management support: Lipid monitoring log, Education handout, Written self-care plan  (08/02/2009)     Lipid self-management support not done because: Good outcomes  (08/29/2010)

## 2010-12-08 NOTE — Progress Notes (Signed)
  Phone Note Call from Patient   Caller: Patient Call For: [phone] Summary of Call: Patient is having a great deal of pain to her lf arm.  In tears and wanted to let Dr. Gearl Baratta know she is still having problems dealing with the deaths of her great neices and great nephews.  She was related to the kids that were killed in November. Initial call taken by: Abundio Miu,  November 22, 2010 4:43 PM  Follow-up for Phone Call        pain in wrist and elbow that is better but still aching. Heating pad and oxycodone helping with pain.   No red or swollen arm/wrist.  Planning to see [name] this Friday to discuss feelings around the deaths within her family.  Follow-up by: Tawanna Cooler Lamiah Marmol MD,  November 23, 2010 1:48 PM

## 2010-12-08 NOTE — Progress Notes (Signed)
Summary: Records  Phone Note Call from Patient Call back at Home Phone [phone]   Summary of Call: pt would like [place] to request records from her sleep study, has appt on 1/30 to f/u with pcp Initial call taken by: Knox Royalty,  November 10, 2010 11:11 AM  Follow-up for Phone Call        Before I call and request these, Dr.Mcdiarmid have you recieved yet? Follow-up by: Jone Baseman CMA,  November 10, 2010 12:17 PM  Additional Follow-up for Phone Call Additional follow up Details #1::        Set up appt with Dr. Shelle Iron for 11/28/10 at 930am, per MD request, patient informed. Additional Follow-up by: Garen Grams LPN,  November 10, 2010 1:46 PM     Appended Document: Records megan, is this pt scheduled for a consult?  Appended Document: Records yes on 1/23 at 2;00

## 2010-12-08 NOTE — Assessment & Plan Note (Signed)
Summary: Concern for  [name]   Vital Signs:  Patient profile:   60 year old female Height:      64 inches Weight:      234 pounds BMI:     40.31 Temp:     98.7 degrees F oral Pulse rate:   69 / minute BP sitting:   153 / 97  (left arm) Cuff size:   regular  Vitals Entered By: Garen Grams LPN (October 10, 2010 4:18 PM) CC: [name] Is Patient Diabetic? No Pain Assessment Patient in pain? no        Primary Care Provider:  TODD MCDIARMID MD  CC:  [name].  History of Present Illness: [name] Patient recently moved into live with her son.  He has noticed episodes of the patient stopping breathing  and Gabriella for air in her Hernandez.  Ms [name] acknowledges being told she is a loud snorer. She has not awoken from snoring or Gabriella for air. She does awaken with a dry mouth, but not with headache or sore throat. She acknowledges being tired often during day.  She feels her Hernandez is often non-refreshing. She rarely gets up to urinate at night.  She denies shortness or breath with lying down.   She goes to bed aroung Midnight and awakens at 11 am unless she has to get up to go to work in which case she gets up around 9 am.  She has difficulty getting going in morning because of fatigue.  Rarely has early morning awakening.  Relevant PMH: Hypertension, BMI of 40%. On psychotropic medications and chronic opiates. Major recurrent Depression (+) for years  followed by psychiatry (Dr Betti Cruz).  She does drive a car.  She denies falling asleep while driving car.   Epworth Sleepiness Scale Sitting and reading      0 Watching TV               2 Sitting inactive in public place            0 Being passenger in car for > hour           1 Lying down in the afternoon                    3 Sitting and taling to someone                    0 Stopped for a few minutes in traffic while driving          0 Total score = 6    Habits &  Providers  Alcohol-Tobacco-Diet     Alcohol drinks/day: 0     Tobacco Status: quit     Tobacco Counseling: to remain off tobacco products     Cigarette Packs/Day: 1     Year Quit: 6/09  Current Medications (verified): 1)  Hydrochlorothiazide 25 Mg Tabs (Hydrochlorothiazide) .... Take 1 Tablet By Mouth Once A Day 2)  Metoprolol Tartrate 50 Mg Tabs (Metoprolol Tartrate) .... One Tablet By Mouth Twice A Day 3)  Altace 10 Mg Caps (Ramipril) .... Take 1 Capsule By Mouth Once A Day 4)  Nitrostat 0.6 Mg Subl (Nitroglycerin) .Marland Kitchen.. 1 Tablet Sublingually Every 5 Minutes As Needed For Chest Pain.  Call Emergency Squad If No Relief By Third Tablet. 5)  Potassium Chloride Cr 10 Meq Cr-Tabs (Potassium Chloride) .... Take Two Tablets By Mouth At Bedtime 6)  Aspir-Low 81 Mg  Tbec (Aspirin) .Marland Kitchen.. 1 Tablet By Mouth Daily 7)  Synthroid 50 Mcg Tabs (Levothyroxine Sodium) .... 3 Tablets By Mouth Once A Day 8)  Ativan 1 Mg Tabs (Lorazepam) .Marland Kitchen.. 1 Tablet By Mouth Three Times A  Day and Two Tablets At Bedtime. Prescribed By Dr. Betti Cruz 9)  Hydroxyzine Hcl 50 Mg Tabs (Hydroxyzine Hcl) .... 3 Tab By Mouth At Bedtime 10)  Bentyl 20 Mg Tabs (Dicyclomine Hcl) .... Take 2 Tablet By Mouth Four Times A Day As Needed For Burping and Nausea 11)  Omeprazole 20 Mg Tbec (Omeprazole) .... Take 1 Tab By Mouth Twice A Day 12)  Prochlorperazine Maleate 10 Mg  Tabs (Prochlorperazine Maleate) .Marland Kitchen.. 1 Tablet Qid As Needed Nausea or Vomiting 13)  Colace 100 Mg  Caps (Docusate Sodium) .... 4 To 5  Capsule At Bedtime 14)  Calcium 600/vitamin D 600-400 Mg-Unit  Tabs (Calcium Carbonate-Vitamin D) .Marland Kitchen.. 1 Tablet A Day By Mouth Twice A Day 15)  Cvs Pain Relief Extra Strength 500 Mg  Tabs (Acetaminophen) .... 2 Tablets Three To Four Times A Day, As Needed For Osteoarthritis Pain. 16)  Clarinex 5 Mg Tabs (Desloratadine) .... One Tablet By Mouth Every Other Day As Needed For Allergies 17)  Multivitamins   Tabs (Multiple Vitamin) .... One Daily 18)   Ms Contin 15 Mg Xr12h-Tab (Morphine Sulfate) .Marland Kitchen.. 1 Tablet By Mouth Every 12 Hours. Disp:60. Fill On of After 08/05/10 19)  Miralax  Powd (Polyethylene Glycol 3350) .... 2 Tablespoons Daily 20)  Oleptro 300 Mg Xr24h-Tab (Trazodone Hcl) .... One Tablet By Mouth Daily 21)  Ms Contin 15 Mg Xr12h-Tab (Morphine Sulfate) .Marland Kitchen.. 1 Tablet By Mouth Twice A Day. Fill On or After 10/04/10 22)  Ms Contin 15 Mg Xr12h-Tab (Morphine Sulfate) .Marland Kitchen.. 1 Tablet By Mouth Twice A Day. Fill On or After 11/03/10. 23)  Oxycodone Hcl 5 Mg Tabs (Oxycodone Hcl) .... 2 Tablets By Mouth Twice A Day As Needed For Breakthrough Pain. Disp: 120. Fill On or After 10/04/10 24)  Oxycodone Hcl 5 Mg Tabs (Oxycodone Hcl) .... 2 Tablets By Mouth Twice A Day As Needed For Breakthrough Pain. Disp: 120. Fill On or After 11/03/10  Allergies: 1)  ! Arthrotec 50 (Diclofenac-Misoprostol) 2)  Naprosyn (Naproxen) 3)  Paxil (Paroxetine Hcl) 4)  Prednisone (Prednisone) 5)  * Propulcid 6)  Vivelle 7)  * Phenelzine 8)  Aspirin 9)  * Abilify 10)  * Emsam (Selegiline Patch)  Family History: Reviewed history from 05/30/2010 and no changes required. (+)DMT2 ( + )HTN  +MI + Alcoholism +migraine +mood d/os +asthma  Social History: Reviewed history from 08/29/2010 and no changes required. 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   Quit smoking tobacco June 09. multiple times previously No etoh  Quit Smoking marajhuana June 09, no other illicit drugs use Religious - [employer] Divorced  4 children  No IVDA  Physical Exam  General:  Neck Circumference 16.5 inches.  Obese. BMI 40%. Nose:  No polyps, normal nasal mucosa.  able to breath thru nose. No septal deviation.  Mouth:  View of Oropharynx is partially obstructed by tongue.  No tonsilar hypertrophy. No jaw retropulsion.  Uvula not enlarged. normal tongue size.  Lungs:  normal respiratory effort, no intercostal retractions, no accessory muscle use, no crackles, and no wheezes.   Abdomen:  Truncal obesity.   Impression &  Recommendations:  Problem # 1:  SNORING (ICD-786.09)  While patient has symptoms of snoring, dry mouth in morning, witnessed Gabriella and apnea in Hernandez by family members, non-refreshing Hernandez, and fatigue with co-morbid conditions of obesity (BMI 40% and neck circumference 16.5 inches), and hypertension, her Epworth sleepiness scale was only 6 points.   Given the reported signs, symptoms and co-morbid states of Obstructive [name], it seems reasonalbe to consult Hernandez specialists for evaluation for possible OSA.   Orders: Hernandez Disorder Referral (Hernandez Disorder) [facility]- Est Level  3 (56213)  Complete Medication List: 1)  Hydrochlorothiazide 25 Mg Tabs (Hydrochlorothiazide) .... Take 1 tablet by mouth once a day 2)  Metoprolol Tartrate 50 Mg Tabs (Metoprolol tartrate) .... One tablet by mouth twice a day 3)  Altace 10 Mg Caps (Ramipril) .... Take 1 capsule by mouth once a day 4)  Nitrostat 0.6 Mg Subl (Nitroglycerin) .Marland Kitchen.. 1 tablet sublingually every 5 minutes as needed for chest pain.  call emergency squad if no relief by third tablet. 5)  Potassium Chloride Cr 10 Meq Cr-tabs (Potassium chloride) .... Take two tablets by mouth at bedtime 6)  Aspir-low 81 Mg Tbec (Aspirin) .Marland Kitchen.. 1 tablet by mouth daily 7)  Synthroid 50 Mcg Tabs (Levothyroxine sodium) .... 3 tablets by mouth once a day 8)  Ativan 1 Mg Tabs (Lorazepam) .Marland Kitchen.. 1 tablet by mouth three times a  day and two tablets at bedtime. prescribed by dr. reddy 9)  Hydroxyzine Hcl 50 Mg Tabs (Hydroxyzine hcl) .... 3 tab by mouth at bedtime 10)  Bentyl 20 Mg Tabs (Dicyclomine hcl) .... Take 2 tablet by mouth four times a day as needed for burping and nausea 11)  Omeprazole 20 Mg Tbec (Omeprazole) .... Take 1 tab by mouth twice a day 12)   Prochlorperazine Maleate 10 Mg Tabs (Prochlorperazine maleate) .Marland Kitchen.. 1 tablet qid as needed nausea or vomiting 13)  Colace 100 Mg Caps (Docusate sodium) .... 4 to 5  capsule at bedtime 14)  Calcium 600/vitamin D 600-400 Mg-unit Tabs (Calcium carbonate-vitamin d) .Marland Kitchen.. 1 tablet a day by mouth twice a day 15)  Cvs Pain Relief Extra Strength 500 Mg Tabs (Acetaminophen) .... 2 tablets three to four times a day, as needed for osteoarthritis pain. 16)  Clarinex 5 Mg Tabs (Desloratadine) .... One tablet by mouth every other day as needed for allergies 17)  Multivitamins Tabs (Multiple vitamin) .... One daily 18)  Ms Contin 15 Mg Xr12h-tab (Morphine sulfate) .Marland Kitchen.. 1 tablet by mouth every 12 hours. disp:60. fill on of after 08/05/10 19)  Miralax Powd (Polyethylene glycol 3350) .... 2 tablespoons daily 20)  Oleptro 300 Mg Xr24h-tab (Trazodone hcl) .... One tablet by mouth daily 21)  Ms Contin 15 Mg Xr12h-tab (Morphine sulfate) .Marland Kitchen.. 1 tablet by mouth twice a day. fill on or after 10/04/10 22)  Ms Contin 15 Mg Xr12h-tab (Morphine sulfate) .Marland Kitchen.. 1 tablet by mouth twice a day. fill on or after 11/03/10. 23)  Oxycodone Hcl 5 Mg Tabs (Oxycodone hcl) .... 2 tablets by mouth twice a day as needed for breakthrough pain. disp: 120. fill on or after 10/04/10 24)  Oxycodone Hcl 5 Mg Tabs (Oxycodone hcl) .... 2 tablets by mouth twice a day as needed for breakthrough pain. disp: 120. fill on or after 11/03/10   Orders Added: 1)  Hernandez Disorder Referral [Hernandez Disorder] 2)  [facility]- Est Level  3 [08657]

## 2010-12-08 NOTE — Letter (Signed)
Summary: Referral Letter for Sleep Specialist Consultation     10/11/2010  Thank you in advance for agreeing to see my patient:  [name] [address] [place], [place]  [address]  Phone: [phone]  Dear Dr Shelle Iron  Reason for Referral: patient has symptoms of snoring, dry mouth in morning, witnessed gasping and apnea in sleep by family members, non-refreshing sleep, and fatigue with co-morbid conditions of obesity (BMI 40% and neck circumference 16.5 inches), and hypertension, her Epworth sleepiness scale was only 6 points .   [facility] which patient reported as abnormal. I do not have her polysomnography report at this time.    [name] was suppose to have seen a [facility] physican specialist prior to the polysomnography, but this physician consultation was not set up as planned.    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) 14)  SIGMOID POLYP (ICD-211.3) 15)  GASTROESOPHAGEAL REFLUX, NO ESOPHAGITIS (ICD-530.81) 16)  HEMORRHOIDS, NOS (ICD-455.6) 17)  LACTOSE INTOLERANCE (ICD-271.3) 18)  IRRITABLE BOWEL SYNDROME (ICD-564.1) 19)  OBESITY (ICD-278.00) 20)  FIBROMYALGIA (ICD-729.1) 21)  OSTEOARTHRITIS, HANDS, BILATERAL (ICD-715.94) 22)  UNEQUAL LEG LENGTH (ICD-736.81) 23)  OSTEOARTHROSIS, LOCAL, PRIMARY, PELVIS/THIGH (ICD-715.15) 24)  Hx of CARPAL TUNNEL SYNDROME (ICD-354.0) 25)  OSTEOARTHRITIS OF SPINE, NOS (ICD-721.90) 26)  RHINITIS,  ALLERGIC (ICD-477.9) 27)  PATELLO FEMORAL STRESS SYNDROME (ICD-719.46) 28)  DISC WITH RADICULOPATHY (ICD-722.71) 29)  DISC SYNDROME, NO MYELOPATHY, NOS (ICD-722.2) 30)  COPD WITHOUT EXACERBATION (ICD-491.20) 31)  Family Hx of NEOPLASM, MALIGNANT, COLON (ICD-153.9) 32)  TOBACCO USE, QUIT (ICD-V15.82)   Current Medications: 1)  HYDROCHLOROTHIAZIDE 25 MG TABS (HYDROCHLOROTHIAZIDE) Take 1 tablet by mouth once a day 2)  METOPROLOL TARTRATE 50 MG TABS (METOPROLOL TARTRATE) One tablet by mouth twice a day 3)  ALTACE 10 MG CAPS (RAMIPRIL) Take 1 capsule by mouth once a day 4)  NITROSTAT 0.6 MG SUBL (NITROGLYCERIN) 1 tablet sublingually every 5 minutes as needed for chest pain.  Call emergency squad if no relief by third tablet. 5)  POTASSIUM CHLORIDE CR 10 MEQ CR-TABS (POTASSIUM CHLORIDE) Take two tablets by mouth at bedtime 6)  ASPIR-LOW 81 MG TBEC (ASPIRIN) 1 tablet by mouth daily 7)  SYNTHROID 50 MCG TABS (LEVOTHYROXINE SODIUM) 3 tablets by mouth once a day 8)  ATIVAN 1 MG TABS (LORAZEPAM) 1 tablet by mouth three times a  day and two tablets at bedtime. Prescribed by Dr. Betti Cruz 9)  HYDROXYZINE HCL 50 MG TABS (HYDROXYZINE HCL) 3 tab by mouth at bedtime 10)  BENTYL 20 MG TABS (DICYCLOMINE HCL) Take 2 tablet by mouth four times a day as needed for burping and nausea 11)  OMEPRAZOLE 20 MG TBEC (OMEPRAZOLE) Take 1 tab by mouth twice a day 12)  PROCHLORPERAZINE MALEATE 10 MG  TABS (PROCHLORPERAZINE MALEATE) 1 tablet QID as needed nausea or vomiting 13)  COLACE 100 MG  CAPS (DOCUSATE SODIUM) 4 to 5  capsule at bedtime 14)  CALCIUM 600/VITAMIN D 600-400 MG-UNIT  TABS (CALCIUM CARBONATE-VITAMIN D) 1 tablet a day by mouth twice a day 15)  [facility] PAIN RELIEF EXTRA STRENGTH  500 MG  TABS (ACETAMINOPHEN) 2 tablets three to four times a day, as needed for osteoarthritis pain. 16)  CLARINEX 5 MG TABS (DESLORATADINE) One tablet by mouth every other day as needed for allergies 17)  MULTIVITAMINS   TABS (MULTIPLE  VITAMIN) One daily 18)  Gabriella CONTIN 15 MG XR12H-TAB (MORPHINE SULFATE) 1 tablet by mouth every 12 hours. Disp:60. Fill on of after 08/05/10 19)  MIRALAX  POWD (POLYETHYLENE GLYCOL 3350) 2 tablespoons daily 20)  OLEPTRO 300 MG XR24H-TAB (TRAZODONE HCL) One tablet by mouth daily 21)  Gabriella CONTIN 15 MG XR12H-TAB (MORPHINE SULFATE) 1 tablet by mouth twice a day. Fill on or after 10/04/10 22)  Gabriella CONTIN 15 MG XR12H-TAB (MORPHINE SULFATE) 1 tablet by mouth twice a day. Fill on or after 11/03/10. 23)  OXYCODONE HCL 5 MG TABS (OXYCODONE HCL) 2 tablets by mouth twice a day as needed for breakthrough pain. Disp: 120. Fill on or after 10/04/10 24)  OXYCODONE HCL 5 MG TABS (OXYCODONE HCL) 2 tablets by mouth twice a day as needed for breakthrough pain. Disp: 120. Fill on or after 11/03/10   Past Medical History: 1)  Prediabetes 2)  Obesity 3)  Hx of Community-acquired MRSA Skin infection(2007), 4)  Hx of Left-sided Sacroiliitis 5)  HxRight Hip Trochanteric Bursitis (05/06) 6)  MRI-Lumbar(08/03):GeneralDDD;L2-3Lg Ant HNP. Mod pos  hnp - 12/14/2005, smhnpw/irritL4root - 12/14/2005 7)  Lumbar MRI (12/14/2005) with contrast: L3-4 Moderate size HNP with biforaminal protrusion - possible L3 root irritation, 8)  Left hip osteoarthritis with reactive effusion, 2008 9)  Multiple Medication Sensitivities 10)  Intolerant of Paxil, propulcid, habitrol, welbutrin  11)  Irritable Bowel Syndrome- Mixed Diarrhea and Constipation 12)  Cardic Angiocath & ventriculogram (01/27/10, Dr Mendel Ryder): Results: Widely patent coronary arteries, Normal left ventricular function.  Possible left ventricular  hypertrophy, Chest pain was probably noncardiac in origin, Abnormal nuclear study, felt to be false positive due to breast attenuation artifact on a 2-day protocol.   Prior History of Blood Transfusions:   Pertinent Labs:    Thank you again for agreeing to see our patient; please contact us if you have any further questions or need  additional information.  Sincerely,  Tawanna Cooler Stryker Veasey MD

## 2010-12-08 NOTE — Progress Notes (Signed)
Summary: Rx needs signing  Phone Note Call from Patient Call back at Home Phone [phone]   Summary of Call: pts rx for oxycodone was not signed, wants to know if she can bring it  today to be signed?  Initial call taken by: Knox Royalty,  November 08, 2010 10:00 AM  Follow-up for Phone Call        paged Dr. Perley Jain and he will come  over at lunch and sign. patient notified.  Follow-up by: Theresia Lo RN,  November 08, 2010 10:08 AM

## 2010-12-14 NOTE — Assessment & Plan Note (Signed)
Summary: f/u sleep study/eo   Vital Signs:  Patient profile:   60 year old female Height:      64 inches Weight:      224 pounds BMI:     38.59 BSA:     2.05 Temp:     97.9 degrees F Pulse rate:   60 / minute BP sitting:   126 / 78  Vitals Entered By: Jone Baseman CMA (December 05, 2010 11:13 AM)  Primary Care Provider:  Jaren Kearn MD   History of Present Illness: Chronic Pain Syndrome. Adequate pain control in right hip and bilateral knees. 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  Miralax daily use is controlling her constipation Denies confusion, incoordination, falls or trauma since last OV. Taking MS Contin twice a day.  Taking Oxycodone 2 tablets twice a day, usually one dose before starting work and one dose at home, sometimes before bedtime.    Periodic Leg movement of Sleep Reviewed polysomnography findings of aobut 100 PLM during sleep with interuption of good sleep architectue about 20% of the PLM.  Still having unrefreshing sleep.    HYPERTENSION Disease Monitoring   Blood pressure range:not checking at home   Chest pain:none   Dyspnea:none   Claudication:none  Medications   Compliance:taking ramipril, metoprolol and hctz will  Side effects   Lightheadedness:none   Urinary frequency:none   Edema:none     Prevention   Exercise:has not been exercising.  Exhgausted at end of work day.     Diet pattern: Righ  Habits & Providers  Alcohol-Tobacco-Diet     Alcohol drinks/day: 0     Tobacco Status: quit > 6 months     Tobacco Counseling: to remain off tobacco products  Current Medications (verified): 1)  Hydrochlorothiazide 25 Mg Tabs (Hydrochlorothiazide) .... Take 1 Tablet By Mouth Once A Day 2)  Metoprolol Tartrate 50 Mg Tabs (Metoprolol Tartrate) .... One Tablet By Mouth Twice A Day 3)  Altace 10 Mg Caps (Ramipril) .... Take 1 Capsule By Mouth Once A Day 4)  Nitrostat 0.6 Mg Subl (Nitroglycerin) .Marland Kitchen.. 1 Tablet Sublingually Every 5  Minutes As Needed For Chest Pain.  Call Emergency Squad If No Relief By Third Tablet. 5)  Potassium Chloride Cr 10 Meq Cr-Tabs (Potassium Chloride) .... Take Two Tablets By Mouth At Bedtime 6)  Aspir-Low 81 Mg Tbec (Aspirin) .Marland Kitchen.. 1 Tablet By Mouth Daily 7)  Synthroid 50 Mcg Tabs (Levothyroxine Sodium) .... 3 Tablets By Mouth Once A Day 8)  Ativan 1 Mg Tabs (Lorazepam) .Marland Kitchen.. 1 Tablet By Mouth Three Times A  Day and Two Tablets At Bedtime. Prescribed By Dr. Betti Cruz 9)  Hydroxyzine Hcl 50 Mg Tabs (Hydroxyzine Hcl) .... 3 Tab By Mouth At Bedtime 10)  Bentyl 20 Mg Tabs (Dicyclomine Hcl) .... Take 2 Tablet By Mouth Four Times A Day As Needed For Burping and Nausea 11)  Omeprazole 20 Mg Tbec (Omeprazole) .... Take 1 Tab By Mouth Twice A Day 12)  Prochlorperazine Maleate 10 Mg  Tabs (Prochlorperazine Maleate) .Marland Kitchen.. 1 Tablet Qid As Needed Nausea or Vomiting 13)  Colace 100 Mg  Caps (Docusate Sodium) .... 4 To 5  Capsule At Bedtime 14)  Calcium 600/vitamin D 600-400 Mg-Unit  Tabs (Calcium Carbonate-Vitamin D) .Marland Kitchen.. 1 Tablet A Day By Mouth Twice A Day 15)  Cvs Pain Relief Extra Strength 500 Mg  Tabs (Acetaminophen) .... 2 Tablets Three To Four Times A Day, As Needed For Osteoarthritis Pain. 16)  Miralax  Powd (Polyethylene Glycol 3350) .... 2 Tablespoons Daily 17)  Oleptro 300 Mg Xr24h-Tab (Trazodone Hcl) .... One Tablet By Mouth Daily 18)  Ms Contin 15 Mg Xr12h-Tab (Morphine Sulfate) .Marland Kitchen.. 1 Tablet By Mouth Twice A Day. Fill On or After 12/06/10. 19)  Oxycodone Hcl 5 Mg Tabs (Oxycodone Hcl) .... 2 Tablets By Mouth Twice A Day As Needed For Breakthrough Pain. Disp: 120. Fill On or After1/30/12 20)  Ms Contin 15 Mg Xr12h-Tab (Morphine Sulfate) .Marland Kitchen.. 1 Tablet By Mouth Twice A Day. Fill On or After 01/05/11 21)  Ms Contin 15 Mg Xr12h-Tab (Morphine Sulfate) .Marland Kitchen.. 1 Tablet By Mouth Twice A Day. Fill On or After 02/05/11 22)  Oxycodone Hcl 5 Mg Tabs (Oxycodone Hcl) .... 2 Tablets By Mouth Twice A Day As Needed For Breakthrough  Pain. Disp: 120. Fill On or After 01/05/11 23)  Oxycodone Hcl 5 Mg Tabs (Oxycodone Hcl) .... 2 Tablets By Mouth Twice A Day As Needed For Breakthrough Pain. Disp: 120. Fill On or After 02/05/11  Allergies (verified): 1)  ! Arthrotec 50 (Diclofenac-Misoprostol) 2)  Naprosyn (Naproxen) 3)  Paxil (Paroxetine Hcl) 4)  Prednisone (Prednisone) 5)  * Propulcid 6)  Vivelle 7)  * Phenelzine 8)  Aspirin 9)  * Abilify 10)  * Emsam (Selegiline Patch)  Social History: Smoking Status:  quit > 6 months  Physical Exam  General:  alert and obese   Lungs:  Normal respiratory effort, chest expands symmetrically. Lungs are clear to auscultation, no crackles or wheezes. Heart:  Normal rate and regular rhythm. S1 and S2 normal without gallop, murmur, click, rub or other extra sounds. Extremities:  trace left pedal edema and trace right pedal edema.     Impression & Recommendations:  Problem # 1:  PERIODIC LIMB MOVEMENT DISORDER (ICD-333.99) Assessment New  After discussion of pharmacologic treatment options including Requip and Mirapex, including the drug side effects of GI upset, she is would rather attempt just the nonpharmacologic suggestions recommended by Dr Shelle Iron [facility]). She will let me know if she would like to try a trial of pharmacologic tx.   Orders: [facility]- Est  Level 4 (40981)  Problem # 2:  HYPERTENSION, BENIGN SYSTEMIC (ICD-401.1) Assessment: Comment Only  Adequate control. Tolerating medication. No new organ damage. Patient continues to lose weight with associated improvement in blood pressure control .  Will try trial of decreasing HCTZ to half tablet daily and recheck BP on next OV in 3 months.   Her updated medication list for this problem includes:    Hydrochlorothiazide 25 Mg Tabs (Hydrochlorothiazide) .Marland Kitchen... Take 1 tablet by mouth once a day    Metoprolol Tartrate 50 Mg Tabs (Metoprolol tartrate) ..... One tablet by mouth twice a day    Altace 10 Mg Caps (Ramipril) .Marland Kitchen... Take 1  capsule by mouth once a day  Orders: [facility]- Est  Level 4 (19147)  Problem # 3:  CHRONIC PAIN SYNDROME (ICD-338.4) Assessment: Unchanged  Adequate pain control. Only adverse effect of constipation that patient is able to handle with diet and Miralax.  No abberant behaviors noted.  Pain is controlled  in hips and more in bilateral knees.  Encouraged use of up to 4 gram Acetaminophen in divided scheduled doses. Patient is intolerant of NSAIDS.    Three Rx(handwritten) for MS Contin 60 tablets to fill 12/06/10, 01/05/11, and 4/1/121 given to patient Three Rx (handwritten) for Oxycodone 120 tablet to fill 12/06/10, 01/05/11, and 4/1/121 given to patient  RTC in 3  months to review  effectiveness and side effects of chronic opiate therapy.   Orders: [facility]- Est  Level 4 (16109)  Complete Medication List: 1)  Hydrochlorothiazide 25 Mg Tabs (Hydrochlorothiazide) .... Take 1 tablet by mouth once a day 2)  Metoprolol Tartrate 50 Mg Tabs (Metoprolol tartrate) .... One tablet by mouth twice a day 3)  Altace 10 Mg Caps (Ramipril) .... Take 1 capsule by mouth once a day 4)  Nitrostat 0.6 Mg Subl (Nitroglycerin) .Marland Kitchen.. 1 tablet sublingually every 5 minutes as needed for chest pain.  call emergency squad if no relief by third tablet. 5)  Potassium Chloride Cr 10 Meq Cr-tabs (Potassium chloride) .... Take two tablets by mouth at bedtime 6)  Aspir-low 81 Mg Tbec (Aspirin) .Marland Kitchen.. 1 tablet by mouth daily 7)  Synthroid 50 Mcg Tabs (Levothyroxine sodium) .... 3 tablets by mouth once a day 8)  Ativan 1 Mg Tabs (Lorazepam) .Marland Kitchen.. 1 tablet by mouth three times a  day and two tablets at bedtime. prescribed by dr. reddy 9)  Hydroxyzine Hcl 50 Mg Tabs (Hydroxyzine hcl) .... 3 tab by mouth at bedtime 10)  Bentyl 20 Mg Tabs (Dicyclomine hcl) .... Take 2 tablet by mouth four times a day as needed for burping and nausea 11)  Omeprazole 20 Mg Tbec (Omeprazole) .... Take 1 tab by mouth twice a day 12)  Prochlorperazine Maleate 10 Mg  Tabs (Prochlorperazine maleate) .Marland Kitchen.. 1 tablet qid as needed nausea or vomiting 13)  Colace 100 Mg Caps (Docusate sodium) .... 4 to 5  capsule at bedtime 14)  Calcium 600/vitamin D 600-400 Mg-unit Tabs (Calcium carbonate-vitamin d) .Marland Kitchen.. 1 tablet a day by mouth twice a day 15)  Cvs Pain Relief Extra Strength 500 Mg Tabs (Acetaminophen) .... 2 tablets three to four times a day, as needed for osteoarthritis pain. 16)  Miralax Powd (Polyethylene glycol 3350) .... 2 tablespoons daily 17)  Oleptro 300 Mg Xr24h-tab (Trazodone hcl) .... One tablet by mouth daily 18)  Ms Contin 15 Mg Xr12h-tab (Morphine sulfate) .Marland Kitchen.. 1 tablet by mouth twice a day. fill on or after 12/06/10. 19)  Oxycodone Hcl 5 Mg Tabs (Oxycodone hcl) .... 2 tablets by mouth twice a day as needed for breakthrough pain. disp: 120. fill on or after1/30/12 20)  Ms Contin 15 Mg Xr12h-tab (Morphine sulfate) .Marland Kitchen.. 1 tablet by mouth twice a day. fill on or after 01/05/11 21)  Ms Contin 15 Mg Xr12h-tab (Morphine sulfate) .Marland Kitchen.. 1 tablet by mouth twice a day. fill on or after 02/05/11 22)  Oxycodone Hcl 5 Mg Tabs (Oxycodone hcl) .... 2 tablets by mouth twice a day as needed for breakthrough pain. disp: 120. fill on or after 01/05/11 23)  Oxycodone Hcl 5 Mg Tabs (Oxycodone hcl) .... 2 tablets by mouth twice a day as needed for breakthrough pain. disp: 120. fill on or after 02/05/11 Prescriptions: OXYCODONE HCL 5 MG TABS (OXYCODONE HCL) 2 tablets by mouth twice a day as needed for breakthrough pain. Disp: 120. Fill on or after 02/05/11  #120 x 0   Entered and Authorized by:   Tawanna Cooler Ermias Tomeo MD   Signed by:   Tawanna Cooler Elga Santy MD on 12/06/2010   Method used:   Handwritten   RxID:   [id number] OXYCODONE HCL 5 MG TABS (OXYCODONE HCL) 2 tablets by mouth twice a day as needed for breakthrough pain. Disp: 120. Fill on or after 01/05/11  #120 x 0   Entered and Authorized by:   Tawanna Cooler Keelynn Furgerson MD   Signed by:   Tawanna Cooler  Vickie Melnik MD on 12/06/2010   Method used:    Handwritten   RxID:   [id number] OXYCODONE HCL 5 MG TABS (OXYCODONE HCL) 2 tablets by mouth twice a day as needed for breakthrough pain. Disp: 120. Fill on or after1/30/12  #120 x 0   Entered and Authorized by:   Tawanna Cooler Tanita Palinkas MD   Signed by:   Tawanna Cooler Edem Tiegs MD on 12/06/2010   Method used:   Handwritten   RxID:   [id number] MS CONTIN 15 MG XR12H-TAB (MORPHINE SULFATE) 1 tablet by mouth twice a day. Fill on or after 02/05/11  #60 x 0   Entered and Authorized by:   Tawanna Cooler Vasilisa Vore MD   Signed by:   Tawanna Cooler Shyna Duignan MD on 12/06/2010   Method used:   Handwritten   RxID:   [id number] MS CONTIN 15 MG XR12H-TAB (MORPHINE SULFATE) 1 tablet by mouth twice a day. Fill on or after 01/05/11  #60 x 0   Entered and Authorized by:   Tawanna Cooler Ineze Serrao MD   Signed by:   Tawanna Cooler Arul Farabee MD on 12/06/2010   Method used:   Handwritten   RxID:   [id number] MS CONTIN 15 MG XR12H-TAB (MORPHINE SULFATE) 1 tablet by mouth twice a day. Fill on or after 12/06/10.  #60 x 0   Entered and Authorized by:   Tawanna Cooler Dalin Caldera MD   Signed by:   Tawanna Cooler Angeleigh Chiasson MD on 12/06/2010   Method used:   Handwritten   RxID:   [id number]    Orders Added: 1)  [facility]- Est  Level 4 [51884]     Prevention & Chronic Care Immunizations   Influenza vaccine: Historical  (07/22/2010)   Influenza vaccine due: 08/10/2010    Tetanus booster: 03/06/2002: Done.   Tetanus booster due: 03/06/2012    Pneumococcal vaccine: Not documented    H. zoster vaccine: Not documented  Colorectal Screening   Hemoccult: refused  (09/10/2008)   Hemoccult due: Not Indicated    Colonoscopy: Hyperplastic Polyp  (01/23/2008)   Colonoscopy due: 01/22/2018  Other Screening   Pap smear: refused  (11/12/2008)   Pap smear due: Not Indicated    Mammogram: Normal  (10/12/2010)   Mammogram due: 10/2011    DXA bone density scan: Calcaneal Bone density measure by Dr Raymondo [facility] at [facility] with  T score > zero.   (05/06/2008)   DXA scan due:  None    Smoking status: quit > 6 months  (12/05/2010)  Lipids   Total Cholesterol: 202  (08/02/2009)   Lipid panel action/deferral: Lipid Panel ordered   LDL: 98  (08/02/2009)   LDL Direct: 108  (12/24/2008)   HDL: 47  (08/02/2009)   Triglycerides: 287  (08/02/2009)    SGOT (AST): 32  (01/26/2010)   BMP action: Ordered   SGPT (ALT): 46  (01/26/2010)   Alkaline phosphatase: 53  (01/26/2010)   Total bilirubin: 0.4  (01/26/2010)    Lipid flowsheet reviewed?: Yes   Progress toward LDL goal: At goal  Hypertension   Last Blood Pressure: 126 / 78  (12/05/2010)   Serum creatinine: 0.77  (01/28/2010)   Serum potassium 4.0  (01/28/2010)    Hypertension flowsheet reviewed?: Yes   Progress toward BP goal: At goal  Self-Management Support :   Personal Goals (by the next clinic visit) :      Personal blood pressure goal: 140/90  (08/03/2009)     Personal LDL goal: 130  (11/08/2009)    Hypertension self-management support: Written self-care plan, Education handout, Pre-printed educational  material  (02/02/2010)    Hypertension self-management support not done because: Good outcomes  (12/05/2010)    Lipid self-management support: Lipid monitoring log, Education handout, Written self-care plan  (08/02/2009)     Lipid self-management support not done because: Good outcomes  (12/05/2010)

## 2010-12-22 NOTE — Assessment & Plan Note (Signed)
Summary: consult for snoring, RLS?   Copy to:  Cleopatra Cedar McDiarmid Primary Provider/Referring Provider:  Acquanetta Belling MD  CC:  Sleep Consult.  History of Present Illness: The pt is a 59y/o female who I have been asked to see for sleep issues.  She has a history of loud snoring, as well as an abnormal breathing pattern during sleep, and underwent an npsg in Dec of last year.  This showed no clinically significant osa, with AHI of only 3.4/hr, but did have transient oxygen desats as low as 85%.  She spent only spent the entire night less than 88%.  She was also noted to have moderate numbers of PLMS, with 4/hr resulting in arousal or awakening.  The pt goes to bed btw 10-12am, and stays in bed all morning if doesn't have to go to work.  She does not feel rested in the am's upon arising.  She takes a lot of sedating meds for her chronic pain syndrome before bed and after getting up.  She has RLS like symptoms in the evening before bed, and uses chronic narcotics for this. She tells me her pain is better with leg movement.  The pt describes daytime "tiredness" more than true sleepiness, and tells me that she does not have consistent issues with sleepiness while watching tv.  She does not drive long distances, and only has occasional sleep pressure while driving.  Her epworth score today is only 7.  She tells me that her weight has been up, but now is in the process of losing weight.  Current Medications (verified): 1)  Hydrochlorothiazide 25 Mg Tabs (Hydrochlorothiazide) .... Take 1 Tablet By Mouth Once A Day 2)  Metoprolol Tartrate 50 Mg Tabs (Metoprolol Tartrate) .... One Tablet By Mouth Twice A Day 3)  Altace 10 Mg Caps (Ramipril) .... Take 1 Capsule By Mouth Once A Day 4)  Nitrostat 0.6 Mg Subl (Nitroglycerin) .Marland Kitchen.. 1 Tablet Sublingually Every 5 Minutes As Needed For Chest Pain.  Call Emergency Squad If No Relief By Third Tablet. 5)  Potassium Chloride Cr 10 Meq Cr-Tabs (Potassium Chloride) ....  Take Two Tablets By Mouth At Bedtime 6)  Aspir-Low 81 Mg Tbec (Aspirin) .Marland Kitchen.. 1 Tablet By Mouth Daily 7)  Synthroid 50 Mcg Tabs (Levothyroxine Sodium) .... 3 Tablets By Mouth Once A Day 8)  Ativan 1 Mg Tabs (Lorazepam) .Marland Kitchen.. 1 Tablet By Mouth Three Times A  Day and Two Tablets At Bedtime. Prescribed By Dr. Betti Cruz 9)  Hydroxyzine Hcl 50 Mg Tabs (Hydroxyzine Hcl) .... 3 Tab By Mouth At Bedtime 10)  Bentyl 20 Mg Tabs (Dicyclomine Hcl) .... Take 2 Tablet By Mouth Four Times A Day As Needed For Burping and Nausea 11)  Omeprazole 20 Mg Tbec (Omeprazole) .... Take 1 Tab By Mouth Twice A Day 12)  Prochlorperazine Maleate 10 Mg  Tabs (Prochlorperazine Maleate) .Marland Kitchen.. 1 Tablet Qid As Needed Nausea or Vomiting 13)  Colace 100 Mg  Caps (Docusate Sodium) .... 4 To 5  Capsule At Bedtime 14)  Calcium 600/vitamin D 600-400 Mg-Unit  Tabs (Calcium Carbonate-Vitamin D) .Marland Kitchen.. 1 Tablet A Day By Mouth Twice A Day 15)  Cvs Pain Relief Extra Strength 500 Mg  Tabs (Acetaminophen) .... 2 Tablets Three To Four Times A Day, As Needed For Osteoarthritis Pain. 16)  Miralax  Powd (Polyethylene Glycol 3350) .... 2 Tablespoons Daily 17)  Oleptro 300 Mg Xr24h-Tab (Trazodone Hcl) .... One Tablet By Mouth Daily 18)  Ms Contin 15 Mg Xr12h-Tab (Morphine Sulfate) .Marland KitchenMarland KitchenMarland Kitchen  1 Tablet By Mouth Twice A Day. Fill On or After 11/03/10. 19)  Oxycodone Hcl 5 Mg Tabs (Oxycodone Hcl) .... 2 Tablets By Mouth Twice A Day As Needed For Breakthrough Pain. Disp: 120. Fill On or After 11/03/10  Allergies (verified): 1)  ! Arthrotec 50 (Diclofenac-Misoprostol) 2)  Naprosyn (Naproxen) 3)  Paxil (Paroxetine Hcl) 4)  Prednisone (Prednisone) 5)  * Propulcid 6)  Vivelle 7)  * Phenelzine 8)  Aspirin 9)  * Abilify 10)  * Emsam (Selegiline Patch)  Past History:  Past Medical History: Reviewed history from 01/31/2010 and no changes required. Prediabetes Obesity Hx of Community-acquired MRSA Skin infection(2007), Hx of Left-sided Sacroiliitis HxRight Hip  Trochanteric Bursitis (05/06) MRI-Lumbar(08/03):GeneralDDD;L2-3Lg Ant HNP. Mod pos  hnp - 12/14/2005, smhnpw/irritL4root - 12/14/2005 Lumbar MRI (12/14/2005) with contrast: L3-4 Moderate size HNP with biforaminal protrusion - possible L3 root irritation, Left hip osteoarthritis with reactive effusion, 2008  Multiple Medication Sensitivities Intolerant of Paxil, propulcid, habitrol, welbutrin   Irritable Bowel Syndrome- Mixed Diarrhea and Constipation  Cardic Angiocath & ventriculogram (01/27/10, Dr Mendel Ryder): Results: Widely patent coronary arteries, Normal left ventricular function.  Possible left ventricular  hypertrophy, Chest pain was probably noncardiac in origin, Abnormal nuclear study, felt to be false positive due to breast attenuation artifact on a 2-day protocol.  Past Surgical History: Reviewed history from 01/31/2010 and no changes required. Bladder Tack `98, [place] - 04/03/2002,   Bunionectomy, bilaterally with screws  Laparoscopy for Ectopic pregnancy   Colonoscopy 5/04 WNL Dr. Loreta Ave - 04/02/2003  Cystocele and rectocele repair 3/98, [place] -  EGD Left Hip fluoroscopically-guided corticosteorid injection for painful osteoarthritis with good effect (06/2007) Exploratory laparotomy Facial Laceration repair as adolescent  Hysterectomy & RSO  Family History: Reviewed history from 05/30/2010 and no changes required. (+)DMT2 ( + )HTN  +MI + Alcoholism +migraine +mood d/os +asthma father with heart disease  Social History: Reviewed history from 08/29/2010 and no changes required. 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  Living with her dgt and son-in-law in two story home.   Quit smoking tobacco June 09 started at age 26. 1 1/2 to 2 ppd . No etoh  Smokes marijuana. Religious - [employer] 4 children  No IVDA  Review of Systems       The  patient complains of shortness of breath with activity, chest pain, acid heartburn, indigestion, abdominal pain, tooth/dental problems, anxiety, depression, and joint stiffness or pain.  The patient denies shortness of breath at rest, productive cough, non-productive cough, coughing up blood, irregular heartbeats, loss of appetite, weight change, difficulty swallowing, sore throat, headaches, nasal congestion/difficulty breathing through nose, sneezing, itching, ear ache, hand/feet swelling, rash, change in color of mucus, and fever.    Vital Signs:  Patient profile:   60 year old female Height:      64 inches Weight:      230.25 pounds BMI:     39.67 O2 Sat:      95 % on Room air Temp:     97.4 degrees F oral Pulse rate:   58 / minute BP sitting:   110 / 74  (left arm) Cuff size:   regular  Vitals Entered By: Arman Filter LPN (November 28, 2010 1:58 PM)  O2 Flow:  Room air CC: Sleep Consult Comments Medications reviewed with patient Arman Filter LPN  November 28, 2010 1:58 PM    Physical  Exam  General:  ow female in nad Eyes:  PERRLA and EOMI.   Nose:  patent without discharge Mouth:  moderate elongation of soft palate and uvula Neck:  no jvd, tmg, LN Lungs:  totally clear to auscultation Heart:  rrr, no mrg Abdomen:  soft and nontender, bs+ Extremities:  mild edema, no cyanosis pulses intact distally Neurologic:  alert and oriented, does not appear sleepy, moves all 4.   Impression & Recommendations:  Problem # 1:  SNORING (ICD-786.09)  the pt did not have clinically signficant osa on her sleep study, but did have mild oxygen desaturation.  She only spent the entire night less than 88%, and I do not think this is significant enough to justify home oxygen.  I have encouraged her to work on weight reduction and non-supine sleep  Problem # 2:  RESTLESS LEGS SYNDROME (ICD-333.94)  I think this may be more of an issue for her than her minimal sleep disordered  breathing.  The pt has classic symptoms in the evening for RLS, and has issues during the night which can disrupt her sleep.  I think she should try a dopamine agonist (either requip or mirapex) to see if this will help.  May also need to r/o iron def. anemia.  Will send a note to her primary to recommend therapy if he feels will not interfere with her other meds (has apparenty been on MAO inhibitor in past?).  It is possible her leg symptoms and leg jerks on her sleep study may all be due to neuropathic pain from her spine disease, but the history is suggestive of true RLS.  Will have to see if she responds to treatment.  Other Orders: Consultation Level IV (36644)  Patient Instructions: 1)  will send a note to your primary md recommending treatment of your restless legs. 2)  I do not think we need to treat you minimal sleep apnea at this point, except with weight loss and staying off your back while sleeping.   3)  Would be happy to see you again if having ongoing issues.

## 2011-01-19 ENCOUNTER — Telehealth: Payer: Self-pay | Admitting: Family Medicine

## 2011-01-19 ENCOUNTER — Encounter: Payer: Self-pay | Admitting: Family Medicine

## 2011-01-19 NOTE — Telephone Encounter (Signed)
Needs note to increase her working hours from 4 hours to 5 hours.  [employer] on [address] is where she works pls call when ready - she is at work, so message would have to be left.

## 2011-01-19 NOTE — Telephone Encounter (Signed)
Will forward to Dr.Mcdiarmid

## 2011-01-23 ENCOUNTER — Telehealth: Payer: Self-pay | Admitting: Family Medicine

## 2011-01-23 NOTE — Telephone Encounter (Signed)
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

## 2011-01-24 ENCOUNTER — Encounter: Payer: Self-pay | Admitting: Family Medicine

## 2011-01-25 LAB — BASIC METABOLIC PANEL
CO2: 28 mEq/L (ref 19–32)
Chloride: 96 mEq/L (ref 96–112)
Creatinine, Ser: 0.69 mg/dL (ref 0.4–1.2)
GFR calc Af Amer: >60 mL/min (ref >60)
GFR calc non Af Amer: >60 mL/min (ref >60)
Glucose, Bld: 139 mg/dL — ABNORMAL HIGH (ref 70–99)
Potassium: 3.7 mEq/L (ref 3.5–5.1)
Potassium: 3.2 mEq/L — ABNORMAL LOW (ref 3.5–5.1)
Sodium: 137 mEq/L (ref 135–145)

## 2011-01-25 LAB — POCT CARDIAC MARKERS
CKMB, poc: <1 ng/mL — ABNORMAL LOW (ref 1.0–8.0)
Troponin i, poc: <0.05 ng/mL (ref 0.00–0.09)

## 2011-01-25 LAB — CBC
MCV: 90.7 fL (ref 78.0–100.0)
Platelets: 270 10*3/uL (ref 150–400)
RDW: 12.7 % (ref 11.5–15.5)
WBC: 12.8 10*3/uL — ABNORMAL HIGH (ref 4.0–10.5)

## 2011-01-25 LAB — CARDIAC PANEL(CRET KIN+CKTOT+MB+TROPI)
CK, MB: 1.9 ng/mL (ref 0.3–4.0)
Relative Index: INVALID (ref 0.0–2.5)
Troponin I: 0.01 ng/mL (ref 0.00–0.06)
Troponin I: 0.04 ng/mL (ref 0.00–0.06)

## 2011-01-25 LAB — CK TOTAL AND CKMB (NOT AT ARMC)
CK, MB: 2.3 ng/mL (ref 0.3–4.0)
Total CK: 74 U/L (ref 7–177)

## 2011-01-25 LAB — LIPID PANEL
Total CHOL/HDL Ratio: 4 RATIO
Triglycerides: 220 mg/dL — ABNORMAL HIGH (ref <150)
VLDL: 44 mg/dL — ABNORMAL HIGH (ref 0–40)

## 2011-01-29 LAB — POCT CARDIAC MARKERS
Myoglobin, poc: 83.4 ng/mL (ref 12–200)
Myoglobin, poc: 84.6 ng/mL (ref 12–200)
Troponin i, poc: <0.05 ng/mL (ref 0.00–0.09)
Troponin i, poc: <0.05 ng/mL (ref 0.00–0.09)

## 2011-01-29 LAB — CBC
HCT: 29.6 % — ABNORMAL LOW (ref 36.0–46.0)
HCT: 36.4 % (ref 36.0–46.0)
HCT: 37 % (ref 36.0–46.0)
Hemoglobin: 10.1 g/dL — ABNORMAL LOW (ref 12.0–15.0)
Hemoglobin: 11.5 g/dL — ABNORMAL LOW (ref 12.0–15.0)
Hemoglobin: 12.6 g/dL (ref 12.0–15.0)
Hemoglobin: 12.8 g/dL (ref 12.0–15.0)
MCHC: 34 g/dL (ref 30.0–36.0)
MCHC: 34.5 g/dL (ref 30.0–36.0)
MCHC: 34.6 g/dL (ref 30.0–36.0)
MCV: 90.4 fL (ref 78.0–100.0)
Platelets: 253 10*3/uL (ref 150–400)
RBC: 3.66 MIL/uL — ABNORMAL LOW (ref 3.87–5.11)
RBC: 4.11 MIL/uL (ref 3.87–5.11)
RDW: 12.9 % (ref 11.5–15.5)
RDW: 13 % (ref 11.5–15.5)
RDW: 13.1 % (ref 11.5–15.5)

## 2011-01-29 LAB — PROTIME-INR
INR: 0.96 (ref 0.00–1.49)
INR: 1.04 (ref 0.00–1.49)

## 2011-01-29 LAB — COMPREHENSIVE METABOLIC PANEL
AST: 32 U/L (ref 0–37)
Albumin: 4.4 g/dL (ref 3.5–5.2)
Calcium: 9.1 mg/dL (ref 8.4–10.5)
Creatinine, Ser: 0.71 mg/dL (ref 0.4–1.2)
Potassium: 3.4 mEq/L — ABNORMAL LOW (ref 3.5–5.1)
Sodium: 138 mEq/L (ref 135–145)
Total Bilirubin: 0.4 mg/dL (ref 0.3–1.2)
Total Protein: 7.7 g/dL (ref 6.0–8.3)

## 2011-01-29 LAB — DIFFERENTIAL
Basophils Absolute: 0 10*3/uL (ref 0.0–0.1)
Basophils Relative: 0 % (ref 0–1)
Eosinophils Absolute: 0 10*3/uL (ref 0.0–0.7)
Eosinophils Relative: 0 % (ref 0–5)
Eosinophils Relative: 1 % (ref 0–5)
Lymphocytes Relative: 15 % (ref 12–46)
Lymphocytes Relative: 26 % (ref 12–46)
Lymphs Abs: 2.8 10*3/uL (ref 0.7–4.0)
Monocytes Absolute: 0.9 10*3/uL (ref 0.1–1.0)
Monocytes Absolute: 1.2 10*3/uL — ABNORMAL HIGH (ref 0.1–1.0)
Monocytes Relative: 11 % (ref 3–12)

## 2011-01-29 LAB — BASIC METABOLIC PANEL
CO2: 29 mEq/L (ref 19–32)
GFR calc non Af Amer: >60 mL/min (ref >60)
Glucose, Bld: 113 mg/dL — ABNORMAL HIGH (ref 70–99)
Potassium: 4 mEq/L (ref 3.5–5.1)
Sodium: 140 mEq/L (ref 135–145)

## 2011-01-29 LAB — POCT I-STAT, CHEM 8
Calcium, Ion: 1.1 mmol/L — ABNORMAL LOW (ref 1.12–1.32)
Hemoglobin: 12.9 g/dL (ref 12.0–15.0)
Sodium: 138 mEq/L (ref 135–145)
TCO2: 28 mmol/L (ref 0–100)

## 2011-01-29 LAB — APTT: aPTT: 37 seconds (ref 24–37)

## 2011-01-29 LAB — CARDIAC PANEL(CRET KIN+CKTOT+MB+TROPI)
CK, MB: 3.1 ng/mL (ref 0.3–4.0)
CK, MB: 3.9 ng/mL (ref 0.3–4.0)
Relative Index: 1.9 (ref 0.0–2.5)
Relative Index: 2.2 (ref 0.0–2.5)
Total CK: 142 U/L (ref 7–177)
Troponin I: <0.01 ng/mL (ref 0.00–0.06)

## 2011-01-29 LAB — TSH: TSH: 8.657 u[IU]/mL — ABNORMAL HIGH (ref 0.350–4.500)

## 2011-01-29 LAB — MRSA PCR SCREENING: MRSA by PCR: NEGATIVE

## 2011-01-29 LAB — GLUCOSE, CAPILLARY

## 2011-02-02 ENCOUNTER — Other Ambulatory Visit: Payer: Self-pay | Admitting: Family Medicine

## 2011-02-02 NOTE — Telephone Encounter (Signed)
Refill request

## 2011-02-20 LAB — BASIC METABOLIC PANEL
GFR calc Af Amer: >60 mL/min (ref >60)
GFR calc non Af Amer: >60 mL/min (ref >60)
Glucose, Bld: 148 mg/dL — ABNORMAL HIGH (ref 70–99)
Potassium: 3.4 mEq/L — ABNORMAL LOW (ref 3.5–5.1)
Sodium: 135 mEq/L (ref 135–145)

## 2011-02-20 LAB — POCT HEMOGLOBIN-HEMACUE: Hemoglobin: 14 g/dL (ref 12.0–15.0)

## 2011-02-21 ENCOUNTER — Ambulatory Visit (INDEPENDENT_AMBULATORY_CARE_PROVIDER_SITE_OTHER): Payer: Federal, State, Local not specified - PPO | Admitting: Family Medicine

## 2011-02-21 VITALS — BP 126/78 | HR 56 | Temp 98.2°F | Ht 64.0 in | Wt 218.8 lb

## 2011-02-21 DIAGNOSIS — L299 Pruritus, unspecified: Secondary | ICD-10-CM | POA: Insufficient documentation

## 2011-02-21 MED ORDER — PERMETHRIN 1 % EX LIQD: Freq: Once | CUTANEOUS | Status: AC

## 2011-02-21 MED ORDER — TRIAMCINOLONE ACETONIDE 0.5 % EX OINT: TOPICAL_OINTMENT | Freq: Two times a day (BID) | CUTANEOUS | Status: DC

## 2011-02-21 NOTE — Progress Notes (Signed)
Itching: Pt has been itching her skin for the last 4 months. She is scratching in her sleep and wakes up to find blood in her bed. She has pruritis on her Rt elbow> let and back and chest (left breast more), on her abdomen and under her buttocks. She has used Eucerin ointment and Baby oil/gel. She has not used any new soaps, dyes, lotions or detergents but has recently started using a body spray in the last 4 months. She has not seen any bed bugs. No one in her family has the same symptoms. She does have dogs in the house but does not sit on the couch where they sit. Her only new meds are aspirin and fexofenadine but these were started after the itching.   ROS: neg except as noted.   PE: GEN: Pt continually scratches while I am in the room.  Skin: multiple puncate lesions over the elbow and around the umbilicus. Streaks of excoriation on her back in reachable areas. Minimal erythema.  Psych: PT is very fixated on the itching.

## 2011-02-21 NOTE — Patient Instructions (Addendum)
You can get Itch-X over the counter. Ask you pharmacy staff if you need helping finding it. Put it on the skin to help with immediate itch relief.  Use the topical steroid twice daily on the skin lesions. Stop using the body spray.  Use the Nix on the body (and on hair as directed) at night and then rinse it all out the next morning. Follow directions on the bottle.  Make sure to follow up with Dr. McDiarmid at the end of thev month.

## 2011-02-21 NOTE — Assessment & Plan Note (Signed)
Pt has pruritis and excoriation in exposed areas that she can reach all over her body. It is unclear if this is due to contact dermatis, pyschogenic causes or infestation with scabies/lice/bed bugs.  Plan to treat for scabies, give pt triamcinolone ointment and treat the itching with Itch-X.  Precepted with Dr. McDiarmid.

## 2011-02-27 ENCOUNTER — Telehealth: Payer: Self-pay | Admitting: Family Medicine

## 2011-02-27 NOTE — Telephone Encounter (Signed)
Told her how to use the nix & that she will likely still itch for up to 2 weeks after treatment. She will apply NIX tonight. Has appt with pcp next week.

## 2011-02-27 NOTE — Telephone Encounter (Signed)
Was seen last Tuesday for scabie. Used topical med for the spots that she had scratched the skin. Still very itchy. Using "itch x" bought the rx for scabies on Fridays.

## 2011-02-27 NOTE — Telephone Encounter (Signed)
Pt returned call, will keep phone close by

## 2011-02-27 NOTE — Telephone Encounter (Signed)
Has question about meds that were given last week for her scabies.

## 2011-03-05 ENCOUNTER — Other Ambulatory Visit: Payer: Self-pay | Admitting: Family Medicine

## 2011-03-05 NOTE — Telephone Encounter (Signed)
Refill request

## 2011-03-06 ENCOUNTER — Ambulatory Visit (INDEPENDENT_AMBULATORY_CARE_PROVIDER_SITE_OTHER): Payer: Federal, State, Local not specified - PPO | Admitting: Family Medicine

## 2011-03-06 ENCOUNTER — Encounter: Payer: Self-pay | Admitting: Family Medicine

## 2011-03-06 VITALS — BP 134/87 | HR 73 | Ht 64.0 in | Wt 215.0 lb

## 2011-03-06 DIAGNOSIS — G894 Chronic pain syndrome: Secondary | ICD-10-CM

## 2011-03-06 DIAGNOSIS — I10 Essential (primary) hypertension: Secondary | ICD-10-CM

## 2011-03-06 DIAGNOSIS — L299 Pruritus, unspecified: Secondary | ICD-10-CM

## 2011-03-06 MED ORDER — MORPHINE SULFATE CR 15 MG PO TB12: ORAL_TABLET | ORAL | Status: DC

## 2011-03-06 MED ORDER — MORPHINE SULFATE CR 15 MG PO TB12: 15.0000 mg | ORAL_TABLET | Freq: Two times a day (BID) | ORAL | Status: DC

## 2011-03-06 MED ORDER — OXYCODONE HCL 5 MG PO TABS: ORAL_TABLET | ORAL | Status: DC

## 2011-03-06 MED ORDER — BETAMETHASONE DIPROPIONATE AUG 0.05 % EX OINT: TOPICAL_OINTMENT | Freq: Two times a day (BID) | CUTANEOUS | Status: DC

## 2011-03-06 MED ORDER — OXYCODONE HCL 5 MG PO TABS: 10.0000 mg | ORAL_TABLET | Freq: Four times a day (QID) | ORAL | Status: DC | PRN

## 2011-03-07 NOTE — Assessment & Plan Note (Addendum)
Adequate pain control. Only adverse effect of constipation that patient is able to handle with diet and colace. No abberant behaviors noted.  Pain is now less in hips and more in bilateral knees.  Encouraged use of up to 4 gram Acetaminophen in divided scheduled doses. Patient is intolerant of NSAIDS.  Her ability to walk at work has increased significantly.  With this walking her body weight has decreased ~ 10 pounds since January 12.   [facility] Rx(handwritten) for MS Contin 60 tablets to fill 03/06/11, 04/05/11, and 6/30/12given to patient Three [facility] (handwritten) for Oxycodone 120 tablet to fill 03/06/11, 04/05/11, and 05/06/11 given to patient. RTC in 3 months.

## 2011-03-07 NOTE — Assessment & Plan Note (Signed)
Adequate glycemic control. Tolerating medications.  No new end-organ damage.  Continue current medications.  

## 2011-03-07 NOTE — Progress Notes (Signed)
  Subjective:    Patient ID: [name], female    DOB: [birth year], 60 y.o.   MRN: [id number]  HPI Chronic Pain Syndrome. Less pain in hips now.  She has been able to start walking and working on [employer] floor at [employer].  She is able to be on her feet for up to 5 hours at a time. She reports pain relief since last visit using MS Contin and oxycodone is 90%.  She has increased her stool softner to 4 to 5 capsules a day and began once a week Miralax for constipation.  This has helped though not cured her constipation.  Denieis confusion, incoordination, GI upset.  She reports pain relief since last visit using MS Contin and oxycodone is 90%. Today Pain has interfered with General activity 7 times in last 24 hours Pain has intergered with Mood 1 times in last 24 hours Pain has interfered with Ability to work (in or out of home) 7 times in last 24 hours Pain has interfered with interactions with other people 3 times in last 24 hours Pain has interfered with Sleep 2  times in last 24 hours. Pain has interfered with Enjoyment of lif 4 times in last 24 hours.    HYPERTENSION Disease Monitoring   Blood pressure range:not checking at home   Chest pain:none   Dyspnea:none   Claudication:none  Medications   Compliance:taking ramipril, metoprolol and hctz will  Side effects   Lightheadedness:none   Urinary frequency:none   Edema:none     Prevention   Exercise:has not been exercising.  Exhgausted at end of work day.     Diet pattern: Right Diet shakes  Medications, past medical history,  family history, social history were reviewed and updated.     Review of Systems     Objective:   Physical Exam        Assessment & Plan:

## 2011-03-21 NOTE — Op Note (Signed)
[name], [name]                ACCOUNT NO.:  [id number]   MEDICAL RECORD NO.:  [id number]          PATIENT TYPE:  AMB   LOCATION:  DSC                          FACILITY:  [facility]   PHYSICIAN:  Katy Fitch. Sypher, M.D. DATE OF BIRTH:  11-03-1951   DATE OF PROCEDURE:  11/13/2008  DATE OF DISCHARGE:                               OPERATIVE REPORT   PREOPERATIVE DIAGNOSIS:  Entrapment neuropathy, right median nerve at  the carpal tunnel.   POSTOPERATIVE DIAGNOSIS:  Entrapment neuropathy, right median nerve at  the carpal tunnel.   OPERATION:  Release of right transverse carpal ligament.   OPERATING SURGEON:  Katy Fitch. Sypher, MD   ASSISTANT:  Marveen Reeks Dasnoit, PA-C   ANESTHESIA:  General by LMA.   SUPERVISING ANESTHESIOLOGIST:  Zenon Mayo, MD   INDICATIONS:  [name] is a 60 year old woman referred through the  courtesy of Dr. Tawanna Cooler McDiarmid of [facility] for evaluation  and management of right hand numbness and discomfort.  Clinical  examination revealed signs of carpal tunnel syndrome.  Electrodiagnostic  studies confirmed median neuropathy.   Due to a failed respond to nonoperative measures, she is brought to the  operating room at this time for release of her right transverse carpal  ligament.   After informed consent, she is brought to the operating room at this  time.   PROCEDURE:  [name] was brought to the operating room and placed in  supine position on the operating table.   Following the induction of general anesthesia by LMA technique, the  right arm was prepped with Betadine soap solution and sterilely draped.  A pneumatic tourniquet was applied to the proximal right brachium.   Following exsanguination of the right arm with an Esmarch bandage, the  arterial tourniquet was inflated to 230 mmHg.  Procedure commenced with  a short incision in the line of the ring finger and the palm.  Subcutaneous tissues were carefully divided  revealing the palmar fascia.  This was split longitudinally to reveal the common sensory branch of the  median nerve.  These were followed back to transcarpal ligaments, which  was gently isolated from the median nerve.  The ligament was then  released along its ulnar border extending into the distal forearm.  This  widely opened carpal canal.  No masses or other predicaments were noted.  Bleeding points along the margin of the released ligament were  electrocauterized with bipolar current followed by repair of the skin  with intradermal 3-0 Prolene suture.   A compressive dressing was applied with a volar plaster splint  maintaining the wrist in 5 degrees of dorsiflexion.  There were no  apparent complications.   For aftercare, [name] is provided a prescription for oxycodone 5 mg  one p.o. q.6 h. p.r.n. pain, 30 tablets without refill.   She has a chronic pain syndrome and is on MS Contin, will be moderately  tolerant to pain medication.      Katy Fitch Sypher, M.D.  Electronically Signed     RVS/MEDQ  D:  11/13/2008  T:  11/13/2008  Job:  [id number]   cc:   Leighton Roach McDiarmid, M.D.
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
## 2011-03-21 NOTE — Op Note (Signed)
NAMEHENCHY, MCCAULEY                ACCOUNT NO.:  [id number]   MEDICAL RECORD NO.:  [id number]          PATIENT TYPE:  AMB   LOCATION:  DSC                          FACILITY:  [facility]   PHYSICIAN:  Katy Fitch. Sypher, M.D. DATE OF BIRTH:  1950/11/10   DATE OF PROCEDURE:  10/20/2008  DATE OF DISCHARGE:                               OPERATIVE REPORT   PREOPERATIVE DIAGNOSIS:  Chronic entrapment neuropathy, left median  nerve at carpal tunnel.   POSTOPERATIVE DIAGNOSIS:  Chronic entrapment neuropathy, left median  nerve at carpal tunnel.   OPERATIONS:  Release of a left transverse carpal ligament.   OPERATING SURGEON:  Katy Fitch. Sypher, MD   ASSISTANT:  Marveen Reeks Dasnoit, PA-C   ANESTHESIA:  General by LMA.   SUPERVISING ANESTHESIOLOGIST:  Zenon Mayo, MD   INDICATIONS:  [name] is a 60 year old woman referred for  evaluation and management of hand discomfort and numbness.  She has  multiple medical problems including chronic pain, obesity, and  hypertension.  She is noted to have significant bilateral hand  discomfort and numbness.  Clinical examination suggested carpal tunnel  syndrome and electrodiagnostic studies confirmed median neuropathy at  the wrist level.   Due to her failure to respond to nonoperative measures, she is brought  to the operating room at this time for release of her left transverse  carpal ligament.   DESCRIPTION OF PROCEDURE:  [name] is brought to the operating room  and placed in supine position on the operating table.   Following an anesthesia consult with Dr. Sampson Goon, general anesthesia  by LMA was recommended and accepted by [name].  She was brought to  room 6 of the [facility] and placed in supine position on the  operating table, and under Dr. Jarrett Ables direct supervision, general  anesthesia by LMA technique induced.   The left arm was prepped with Betadine soap and solution and sterilely  draped.  A  pneumatic tourniquet was applied to proximal left brachium.   Following exsanguination of the left arm with an Esmarch bandage,  arterial tourniquet was inflated to 220 mmHg.   Procedure commenced with a short incision in the line of the ring finger  in the palm.  Subcutaneous tissues were carefully divided taking care to  identify the common sensory branch of the median nerve.  These were  followed back to the median nerve proper, which was then gently isolated  from the transcarpal ligament using a Insurance risk surveyor.   The ligament was then released along its ulnar border extending into the  distal forearm.  This widely opened carpal canal.  No masses or  predicaments were noted.   Bleeding points along the margin of the released ligament were not  problematic.  The wounds were repaired with intradermal 2-0 Prolene.  A  compressive dressing was applied with volar plaster splint maintaining  the wrist in 5 degrees dorsiflexion.   For aftercare, [name] has pain medication routinely at home  including MS Contin and oxycodone.   She will return to see [place] in the office for  followup in 1 week.      Katy Fitch Sypher, M.D.  Electronically Signed     RVS/MEDQ  D:  10/20/2008  T:  10/20/2008  Job:  [id number]

## 2011-03-24 ENCOUNTER — Encounter: Payer: Self-pay | Admitting: Family Medicine

## 2011-03-24 ENCOUNTER — Ambulatory Visit (INDEPENDENT_AMBULATORY_CARE_PROVIDER_SITE_OTHER): Payer: Federal, State, Local not specified - PPO | Admitting: Family Medicine

## 2011-03-24 VITALS — BP 105/75 | HR 70 | Temp 98.6°F | Ht 64.0 in | Wt 275.3 lb

## 2011-03-24 DIAGNOSIS — L0201 Cutaneous abscess of face: Secondary | ICD-10-CM

## 2011-03-24 DIAGNOSIS — L03211 Cellulitis of face: Secondary | ICD-10-CM

## 2011-03-24 MED ORDER — DOXYCYCLINE HYCLATE 100 MG PO TABS: 100.0000 mg | ORAL_TABLET | Freq: Two times a day (BID) | ORAL | Status: AC

## 2011-03-24 NOTE — H&P (Signed)
[facility]  Patient:    [name], GRANTHAM Visit Number: [id number] MRN: [id number]          Service Type: PSY Location: 300 [id number] [id number] Attending Physician:  Rachael Fee Dictated by:   Candi Leash. Orsini, N.P. Admit Date:  03/23/2002                     Psychiatric Admission Assessment  IDENTIFYING INFORMATION:  A 60 year old married white female, voluntarily admitted for depression on Mar 23, 2002.  HISTORY OF PRESENT ILLNESS:  The patient presents with a history of depression, having increased depression over a past relationship with a boyfriend when she found out he had been married when he was with her over a period of many years.  The patient is having difficulty over how hurt she is and now is pushing her husband away and afraid of what she might do.  Her sleep and appetite have been satisfactory.  She denies any psychotic symptoms. She has been picking at her face out of frustration.  The patient is currently promising safety at this time.  PAST PSYCHIATRIC HISTORY:  Last hospitalization was in 1989.  She sees Dr. Betti Cruz on an outpatient basis, and Cleatis Polka, a therapist.  The patient has a history of suicide attempt where she overdosed.  The last time was in 1989. The patient has a history of cutting as a teenager and pulling her hair.  SOCIAL HISTORY:  She is a 60 year old married white female, married for 1 year.  She has 4 grown children, 2 stepdaughters.  She lives with her spouse. 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  FAMILY HISTORY:  Mother with depression, grandfather with anxiety, sister with depression and substance abuse.  ALCOHOL DRUG HISTORY:  The patient smokes.  She denies any alcohol, and has been smoking marijuana, with increasing usage to calm her anxiety.  PAST MEDICAL HISTORY:  Primary care [facility]ellar[facility] Kope is [facility], sees Dr. Jacquelyne Balint.  Medical problems:  Says she has  "edema" all over but with no known etiology.  MEDICATIONS:  Currently on Wellbutrin 200 mg b.i.d. and Ativan 1 mg q.i.d., prescribed by Dr. Betti Cruz.  Has been on Lexapro, Paxil, Celexa in the past.  PHYSICAL EXAMINATION:  The patient is 5 feet 3 inches tall.  She is 157 pounds.  Her vital signs 97.8, 69, 18, blood pressure 145/96.  GENERAL APPEARANCE:  The patient is a 60 year old Caucasian female in no acute distress.  She is well-developed, appears her stated age.  She is well groomed, alert, and cooperative.  HEAD:  Normocephalic, she can raise her eyebrows.  Her hair is short, dark and of equal distribution.  EYES:  Her EOMs are intact bilaterally.  MOUTH:  Her external ear canals are patent.  Hearing is appropriate to conversation.  No sinus tenderness or nasal discharge.  Mucosa is moist with fair dentition. No lesions were seen.  Tongue protrudes midline without tremor.  Can clench her teeth and puff out her cheeks.  NECK:  Supple, no JVD, negative lymphadenopathy.  Thyroid is nonpalpable and nontender.  CHEST:  Clear to auscultation.  No adventitious sounds.  No cough.  HEART:  Regular rate and rhythm, without murmurs, gallops or rubs.  Carotid pulses are equal and adequate.  BREAST EXAM:  Deferred.  ABDOMEN:  Soft, nontender abdomen.  No CVA tenderness.  MUSCULOSKELETAL:  No joint swelling or deformity.  Good range  of motion. Muscle strength and tone is equal bilaterally.  No signs of injury.  SKIN:  Warm and dry, good turgor.  Strong bilateral radial pulses.  NEUROLOGIC:  She is oriented x3.  Cranial nerves are grossly intact.  Good grip strength bilaterally.  No involuntary movements.  Gait is normal. Cerebellar functions were intact, with heel-to-shin, normal alternating movements.  Romberg was negative.  LABORATORY DATA:  CBC within normal limits. CMET is within normal limits.  MENTAL STATUS EXAMINATION:  She is an alert, middle-aged, casually dressed, female,  cooperative.  Speech is clear.  Mood is depressed, affect is patient crying uncontrollably.  Thought processes are coherent.  There is no evidence of psychosis.  Some religious preoccupation noted during conversation. Cognitive function intact.  Memory is fair, judgment and insight fair.  ADMISSION DIAGNOSES: Axis I:    Depressive disorder not otherwise specified. Axis II:   Rule out borderline personality disorder. Axis III:  "Edema." Axis IV:   Problems with primary support group and other psychosocial problems            and medical problems. Axis V:    Current 35, estimated this past year 65-70.  INITIAL PLAN OF CARE:  Voluntary admission for depression and inability to contract.  Contract for safety, check every 15 minutes.  The patient will be monitored.  The patient promises safety.  Obtain labs.  We will have trazodone available for sleep.  We will continue Wellbutrin and decrease the dosage. Add Seroquel for anxiety.  The patient to follow up with Dr. Betti Cruz.  TENTATIVE LENGTH OF STAY:  3-4 days. Dictated by:   Candi Leash. Orsini, N.P. Attending Physician:  Rachael Fee DD:  03/25/02 TD:  03/25/02 Job: [id number]

## 2011-03-24 NOTE — Op Note (Signed)
   NAME:  [name], [name]                          ACCOUNT NO.:  [id number]   MEDICAL RECORD NO.:  [id number]                   PATIENT TYPE:  AMB   LOCATION:  ENDO                                 FACILITY:  [facility]   PHYSICIAN:  Anselmo Rod, M.D.               DATE OF BIRTH:  02-25-1951   DATE OF PROCEDURE:  03/28/2003  DATE OF DISCHARGE:  01/07/2003                                 OPERATIVE REPORT   PROCEDURE:  Screening colonoscopy.   ENDOSCOPIST:  Anselmo Rod, M.D.   INSTRUMENT USED:  Olympus video colonoscope.   INDICATIONS FOR PROCEDURE:  A 60 year old white female undergoing screening  colonoscopy to rule out chronic polyps, masses, etc.   PREPROCEDURE PREPARATION:  Informed consent was procured from the patient.  The patient was fasted for eight hours prior to the procedure and prepped  with a bottle of magnesium citrate and a gallon of GoLYTELY the night prior  to the procedure.   PREPROCEDURE PHYSICAL EXAMINATION:  VITAL SIGNS: Stable.  NECK:  Supple.  CHEST:  Clear to auscultation. S1 and S2 regular.  ABDOMEN:  Soft with normal bowel sounds.   DESCRIPTION OF PROCEDURE:  The patient was placed in the left lateral  decubitus position and sedated with 100 mg of Demerol and 10 mg of Versed  intravenously.  Once the patient was adequately sedated and maintained on  low flow oxygen and continuous cardiac monitoring, the Olympus video  colonoscope was advanced from the rectum to the cecum without difficulty.  There was some residual stool in the colon.  Multiple washes were done.  No  masses, polyps, erosions, ulcerations, or diverticuli were seen. Small  internal hemorrhoids were appreciated on retroflexion in the rectum. The  appendiceal orifice and ileocecal valve were clearly visualized and  photographed.  The patient tolerated the procedure well without  complications.   IMPRESSION:  Essentially unrevealing colonoscopy up to the cecum except for  small internal  hemorrhoids.    RECOMMENDATIONS:  A high fiber diet with liberal fluid intake had been  advocated.  Outpatient follow-up within the next two weeks or earlier if  need be for further recommendations.  Repeat colorectal cancer screening in  the next five years unless the patient develops any abnormal symptoms in the  interim.                                               Anselmo Rod, M.D.    JNM/MEDQ  D:  03/28/2003  T:  03/29/2003  Job:  [id number]   cc:   Leighton Roach McDiarmid, M.D.  [address] [place]  [place] [address]  Fax: [phone]

## 2011-03-24 NOTE — Progress Notes (Signed)
Subjective:    [name] is a 60 y.o. female who presents for evaluation of a possible skin infection located on her chin. Symptoms include swelling and itching, minor pain. . Patient denies chills and fever greater than 100. Precipitating event: plucking chin hairs. . Treatment to date has included warm compresses with minimal relief.  Patient has had history of MRSA abscess on chin in past, feels this is the same thing, wanted to catch it early.   Review of Systems Constitutional: negative Eyes: negative Ears, nose, mouth, throat, and face: negative Respiratory: negative Cardiovascular: negative Hematologic/lymphatic: negative Behavioral/Psych: positive for depression   Also, ROS positive for itching- patient recently diagnosed with scabies, treated, then reinfected when family was not treated.  She saw the dermatologist who confirmed scabies, and has Rx treatment for her and her family.    Objective:    BP 105/75  Pulse 70  Temp(Src) 98.6 F (37 C) (Oral)  Ht 5\' 4"  (1.626 m)  Wt 275 lb 4.8 oz (124.875 kg)  BMI 47.26 kg/m2 General appearance: alert, cooperative and no distress Head: Patient with swelling of chin, mild erythema and no obvious fluctuance.  Eyes: conjunctivae/corneas clear. PERRL, EOM's intact. Fundi benign. Nose: Nares normal. Septum midline. Mucosa normal. No drainage or sinus tenderness. Throat: normal findings: lips normal without lesions, tongue midline and normal, soft palate, uvula, and tonsils normal and Dentures in place.  Neck: no adenopathy, no carotid bruit, no JVD, supple, symmetrical, trachea midline and thyroid not enlarged, symmetric, no tenderness/mass/nodules Lungs: clear to auscultation bilaterally Heart: regular rate and rhythm, S1, S2 normal, no murmur, click, rub or gallop     Assessment:    Cellulitis of the Chin.    Plan:    Doxycycline prescribed.  Patient instructed to worsen if abscess forms or symptoms worsen.

## 2011-03-24 NOTE — Discharge Summary (Signed)
[facility]  Patient:    [name], [name] Visit Number: [id number] MRN: [id number]          Service Type: PSY Location: PIOP Attending Physician:  Denny Peon Dictated by:   Reymundo Poll Dub Mikes, M.D. Admit Date:  04/01/2002 Discharge Date: 04/10/2002                             Discharge Summary  CHIEF COMPLAINT AND PRESENT ILLNESS:  This was the second admission to [facility] for this 60 year old female voluntarily admitted for depression.  History of depression, having increased depression over past relationship with a boyfriend, when she found out that he had been married when he was with her over a period of many years.  Having difficulty over how hurt she is and now is pushing her husband away.  Now afraid of what she might do.  Her sleep and appetite have been satisfactory.  She denies any psychotic symptoms.  She has been picking at her face out of frustration.  Currently promising safety on the unit.  PAST PSYCHIATRIC HISTORY:  Last time inpatient was in 1989.  Sees Dr. Betti Cruz on an outpatient basis as well as ________ for treatment psychotherapy.  ALCOHOL/DRUG HISTORY:  Denies the use or abuse of any substances.  Does use some marijuana.  MEDICAL HISTORY:  Edema, unknown etiology.  MEDICATIONS:  Wellbutrin 200 mg twice a day, Ativan 1 mg four times a day. Has been on Lexapro, Paxil and Celexa in the past.  PHYSICAL EXAMINATION:  Performed and failed to show any acute findings.  MENTAL STATUS EXAMINATION:  Alert, middle-aged female.  Cooperative.  Speech is clear.  Mood is depressed.  Affect is depressed, cries uncontrollably. Thought processes are coherent.  No evidence of psychosis.  No auditory or visual hallucinations.  Some religious preoccupation noted during the conversation.  Cognition well-preserved.  ADMISSION DIAGNOSES: Axis I:    Major depression, recurrent. Axis II:   Rule out personality disorder not  otherwise specified. Axis III:  Edema. Axis IV:   Moderate. Axis V:    Global Assessment of Functioning upon admission 35; highest Global            Assessment of Functioning in the last year 65.  HOSPITAL COURSE:  She was admitted and started intensive individual and group psychotherapy.  She was placed on Wellbutrin and Ativan.  She was also given some Seroquel.  She was started on Zoloft 25 mg per day and she was given some trazodone for sleep.  She worked on herself, worked on grief and loss, issues of trust, of letting go.  She continued to be tearful.  She was denied advanced sick leave.  She felt betrayed by the job, one more issue going toward her trust.  It was felt, on Mar 29, 2002, that she was in full contact with reality.  No active suicidal or homicidal ideation.  She had done a lot of work in the unit.  She felt that she had resolved some of it but was willing to pursue further treatment through the intensive outpatient program. As not suicidal or homicidal and willing to pursue further outpatient treatment, she was discharged.  DISCHARGE DIAGNOSES: Axis I:    1. Major depression, recurrent.            2. Anxiety disorder not otherwise specified. Axis II:   Personality disorder not otherwise specified. Axis III:  Edema.  Axis IV:   Moderate. Axis V:    Global Assessment of Functioning upon discharge 55-60.  DISCHARGE MEDICATIONS: 1. Zoloft 25 mg daily. 2. Wellbutrin SR 200 mg in the morning and 100 mg at 6 p.m. 3. Seroquel 25 mg three times a day. 4. Ativan 0.5 mg as needed for anxiety. 5. Trazodone for sleep.  FOLLOW-UP:  IOP Program. Dictated by:   Reymundo Poll. Dub Mikes, M.D. Attending Physician:  Denny Peon DD:  04/30/02 TD:  05/02/02 Job: [id number]
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
## 2011-03-24 NOTE — Patient Instructions (Signed)
It was nice to meet you.  Please get this antibiotic filled today.  If your chin has increased pain, redness or swelling, or forms a pocket of puss, please contact the office to be seen again.

## 2011-03-26 ENCOUNTER — Telehealth: Payer: Self-pay | Admitting: Sports Medicine

## 2011-03-26 ENCOUNTER — Emergency Department (HOSPITAL_BASED_OUTPATIENT_CLINIC_OR_DEPARTMENT_OTHER)
Admission: EM | Admit: 2011-03-26 | Discharge: 2011-03-26 | Disposition: A | Discharge status: Home / Self Care | Payer: Federal, State, Local not specified - PPO | Attending: Emergency Medicine | Admitting: Emergency Medicine

## 2011-03-26 DIAGNOSIS — J4489 Other specified chronic obstructive pulmonary disease: Secondary | ICD-10-CM | POA: Insufficient documentation

## 2011-03-26 DIAGNOSIS — B86 Scabies: Secondary | ICD-10-CM | POA: Insufficient documentation

## 2011-03-26 DIAGNOSIS — J449 Chronic obstructive pulmonary disease, unspecified: Secondary | ICD-10-CM | POA: Insufficient documentation

## 2011-03-26 DIAGNOSIS — IMO0002 Reserved for concepts with insufficient information to code with codable children: Secondary | ICD-10-CM | POA: Insufficient documentation

## 2011-03-26 DIAGNOSIS — I10 Essential (primary) hypertension: Secondary | ICD-10-CM | POA: Insufficient documentation

## 2011-03-26 DIAGNOSIS — Y92009 Unspecified place in unspecified non-institutional (private) residence as the place of occurrence of the external cause: Secondary | ICD-10-CM | POA: Insufficient documentation

## 2011-03-26 DIAGNOSIS — T23009A Burn of unspecified degree of unspecified hand, unspecified site, initial encounter: Secondary | ICD-10-CM | POA: Insufficient documentation

## 2011-03-26 DIAGNOSIS — Z79899 Other long term (current) drug therapy: Secondary | ICD-10-CM | POA: Insufficient documentation

## 2011-03-26 DIAGNOSIS — E7800 Pure hypercholesterolemia, unspecified: Secondary | ICD-10-CM | POA: Insufficient documentation

## 2011-03-26 DIAGNOSIS — E039 Hypothyroidism, unspecified: Secondary | ICD-10-CM | POA: Insufficient documentation

## 2011-03-26 NOTE — Telephone Encounter (Signed)
Recently took doxycycline, now with swollen lips, swollen face.  Speaking full sentences and denies overt SOB/wheeze however c/o not being able to breath like she normally does after questioning.  Advised to [facility] or ED if unable, needs to be evaluated by physician urgently.  Pt agrees to go.

## 2011-03-31 ENCOUNTER — Ambulatory Visit (INDEPENDENT_AMBULATORY_CARE_PROVIDER_SITE_OTHER): Payer: Federal, State, Local not specified - PPO | Admitting: Family Medicine

## 2011-03-31 ENCOUNTER — Encounter: Payer: Self-pay | Admitting: Family Medicine

## 2011-03-31 VITALS — BP 114/79 | HR 62 | Wt 214.0 lb

## 2011-03-31 DIAGNOSIS — L259 Unspecified contact dermatitis, unspecified cause: Secondary | ICD-10-CM

## 2011-03-31 MED ORDER — HYDROXYZINE HCL 50 MG PO TABS: ORAL_TABLET | ORAL | Status: DC

## 2011-03-31 NOTE — Patient Instructions (Signed)
It was very nice to meet you today.  Good News! I don't see any more signs of scabies and your chemical burns are improving.  You are now having an allergic reaction. Add Zantac daily for the next few days. Use 1% Hydrocortisone on your face and neck. Don't scratch!

## 2011-04-05 ENCOUNTER — Ambulatory Visit (HOSPITAL_COMMUNITY)
Admission: RE | Admit: 2011-04-05 | Discharge: 2011-04-05 | Disposition: A | Discharge status: Home / Self Care | Payer: Federal, State, Local not specified - PPO | Attending: Psychiatry | Admitting: Psychiatry

## 2011-04-05 ENCOUNTER — Emergency Department (HOSPITAL_COMMUNITY)
Admission: EM | Admit: 2011-04-05 | Discharge: 2011-04-06 | Disposition: A | Discharge status: Other Acute Inpatient Hospital | Payer: Federal, State, Local not specified - PPO | Attending: Emergency Medicine | Admitting: Emergency Medicine

## 2011-04-05 DIAGNOSIS — E039 Hypothyroidism, unspecified: Secondary | ICD-10-CM | POA: Insufficient documentation

## 2011-04-05 DIAGNOSIS — IMO0001 Reserved for inherently not codable concepts without codable children: Secondary | ICD-10-CM | POA: Insufficient documentation

## 2011-04-05 DIAGNOSIS — M25559 Pain in unspecified hip: Secondary | ICD-10-CM | POA: Insufficient documentation

## 2011-04-05 DIAGNOSIS — F339 Major depressive disorder, recurrent, unspecified: Secondary | ICD-10-CM | POA: Insufficient documentation

## 2011-04-05 DIAGNOSIS — I10 Essential (primary) hypertension: Secondary | ICD-10-CM | POA: Insufficient documentation

## 2011-04-05 DIAGNOSIS — F329 Major depressive disorder, single episode, unspecified: Secondary | ICD-10-CM | POA: Insufficient documentation

## 2011-04-05 DIAGNOSIS — F3289 Other specified depressive episodes: Secondary | ICD-10-CM | POA: Insufficient documentation

## 2011-04-05 DIAGNOSIS — R9431 Abnormal electrocardiogram [ECG] [EKG]: Secondary | ICD-10-CM | POA: Insufficient documentation

## 2011-04-05 LAB — DIFFERENTIAL
Basophils Absolute: 0 10*3/uL (ref 0.0–0.1)
Lymphocytes Relative: 12 % (ref 12–46)
Monocytes Absolute: 0.8 10*3/uL (ref 0.1–1.0)
Neutro Abs: 12.9 10*3/uL — ABNORMAL HIGH (ref 1.7–7.7)
Neutrophils Relative %: 82 % — ABNORMAL HIGH (ref 43–77)

## 2011-04-05 LAB — URINALYSIS, ROUTINE W REFLEX MICROSCOPIC
Glucose, UA: NEGATIVE mg/dL
Protein, ur: NEGATIVE mg/dL
Specific Gravity, Urine: 1.009 (ref 1.005–1.030)
Urobilinogen, UA: 0.2 mg/dL (ref 0.0–1.0)

## 2011-04-05 LAB — COMPREHENSIVE METABOLIC PANEL
AST: 15 U/L (ref 0–37)
Albumin: 3.5 g/dL (ref 3.5–5.2)
BUN: 12 mg/dL (ref 6–23)
Creatinine, Ser: 0.71 mg/dL (ref 0.4–1.2)
GFR calc Af Amer: >60 mL/min (ref >60)
Total Protein: 7.7 g/dL (ref 6.0–8.3)

## 2011-04-05 LAB — SALICYLATE LEVEL: Salicylate Lvl: <2 mg/dL — ABNORMAL LOW (ref 2.8–20.0)

## 2011-04-05 LAB — RAPID URINE DRUG SCREEN, HOSP PERFORMED
Barbiturates: NOT DETECTED
Cocaine: NOT DETECTED
Opiates: POSITIVE — AB

## 2011-04-05 LAB — CBC
Hemoglobin: 13.2 g/dL (ref 12.0–15.0)
MCHC: 32.8 g/dL (ref 30.0–36.0)
WBC: 15.7 10*3/uL — ABNORMAL HIGH (ref 4.0–10.5)

## 2011-04-05 LAB — TRICYCLICS SCREEN, URINE: TCA Scrn: NOT DETECTED

## 2011-04-05 LAB — ACETAMINOPHEN LEVEL: Acetaminophen (Tylenol), Serum: <15 ug/mL (ref 10–30)

## 2011-04-06 ENCOUNTER — Inpatient Hospital Stay (HOSPITAL_COMMUNITY)
Admission: AD | Admit: 2011-04-06 | Discharge: 2011-04-10 | DRG: 430 | Disposition: A | Discharge status: Home Health | Payer: Federal, State, Local not specified - PPO | Source: Ambulatory Visit | Attending: Psychiatry | Admitting: Psychiatry

## 2011-04-06 ENCOUNTER — Ambulatory Visit: Payer: Federal, State, Local not specified - PPO

## 2011-04-06 DIAGNOSIS — M1990 Unspecified osteoarthritis, unspecified site: Secondary | ICD-10-CM

## 2011-04-06 DIAGNOSIS — Z5900 Homelessness unspecified: Secondary | ICD-10-CM

## 2011-04-06 DIAGNOSIS — F339 Major depressive disorder, recurrent, unspecified: Principal | ICD-10-CM

## 2011-04-06 DIAGNOSIS — G2581 Restless legs syndrome: Secondary | ICD-10-CM

## 2011-04-06 DIAGNOSIS — J4489 Other specified chronic obstructive pulmonary disease: Secondary | ICD-10-CM

## 2011-04-06 DIAGNOSIS — I10 Essential (primary) hypertension: Secondary | ICD-10-CM

## 2011-04-06 DIAGNOSIS — G8929 Other chronic pain: Secondary | ICD-10-CM

## 2011-04-06 DIAGNOSIS — F1210 Cannabis abuse, uncomplicated: Secondary | ICD-10-CM

## 2011-04-06 DIAGNOSIS — F331 Major depressive disorder, recurrent, moderate: Secondary | ICD-10-CM

## 2011-04-06 DIAGNOSIS — E039 Hypothyroidism, unspecified: Secondary | ICD-10-CM

## 2011-04-06 DIAGNOSIS — M810 Age-related osteoporosis without current pathological fracture: Secondary | ICD-10-CM

## 2011-04-06 DIAGNOSIS — J449 Chronic obstructive pulmonary disease, unspecified: Secondary | ICD-10-CM

## 2011-04-06 DIAGNOSIS — R9431 Abnormal electrocardiogram [ECG] [EKG]: Secondary | ICD-10-CM | POA: Insufficient documentation

## 2011-04-06 DIAGNOSIS — Z7982 Long term (current) use of aspirin: Secondary | ICD-10-CM

## 2011-04-06 LAB — URINE CULTURE: Colony Count: 9000

## 2011-04-07 DIAGNOSIS — F339 Major depressive disorder, recurrent, unspecified: Secondary | ICD-10-CM

## 2011-04-07 DIAGNOSIS — F1210 Cannabis abuse, uncomplicated: Secondary | ICD-10-CM

## 2011-04-07 NOTE — Consult Note (Signed)
  NAMEJENIECE, [name]                ACCOUNT NO.:  [id number]  MEDICAL RECORD NO.:  [id number]           PATIENT TYPE:  E  LOCATION:  WLED                         FACILITY:  [facility]  PHYSICIAN:  Eulogio Ditch, MD DATE OF BIRTH:  Feb 18, 1951  DATE OF CONSULTATION:  04/05/2011 DATE OF DISCHARGE:                                CONSULTATION   REASON FOR CONSULTATION:  Depression.  HISTORY OF PRESENT ILLNESS:  I saw the patient and reviewed the medical records.  A 60 year old female with a long history of depression who came to [facility] ED she was feeling very depressed, but denies any suicidal ideation.  The patient reported that they have a family union on Apr 01, 2011, and all came back regarding the deaths of the niece and the nephews.  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 The patient reported that she is also suffering from scabies, so all that things makes her mood depressed and that is why she came to get help.  The patient is followed outside by Dr. Betti Cruz and by a therapist [name].  CURRENT PSYCH MEDICATIONS:  The patient is on doxycycline 100 mg p.o. daily along with trazodone 300 mg daily and Ativan 1 mg daily.  The patient reported that in the past she was on Emsam patch.  I reviewed the last discharge summary by Dr. Dub Mikes.  The patient has been tried on number of antidepressants in the past, but Emsam was working for the patient.  The patient reported that she discontinued the Emsam because of the allergic reaction.  MENTAL STATUS EXAMINATION:  The patient is very calm, cooperative during the interview.  Fair eye contact.  No abnormal movements noticed. Speech normal in rate, rhythm, and volume.  Mood depressed.  Affect mood congruent.  Thought process logical and goal directed, not suicidal or homicidal.  No audiovisual hallucination reported, not internally preoccupied.   Cognition; alert, awake, oriented x3.  Memory; immediate, recent, remote fair.  Attention and concentration fair.  Abstraction ability fair.  Insight and judgment fair.  DIAGNOSES:  Axis I:  Major depressive disorder, recurrent type, without psychotic symptoms. Axis II:  Deferred. Axis III:  History of hypertension, irritable bowel syndrome, restless legs syndrome, scabies; allergy to ASPIRIN, PREDNISONE; arthritic. Axis IV:  Numerous family deaths. Axis V:  40.  RECOMMENDATIONS: 1. The patient will be admitted to [facility] for further     stabilization. 2. I will defer the treatment to the admitting doctor on the unit. 3. The patient agrees for admission to [facility] at this time.     Eulogio Ditch, MD     SA/MEDQ  D:  04/06/2011  T:  04/06/2011  Job:  [id number]  Electronically Signed by Eulogio Ditch  on 04/07/2011 06:10:19 PM

## 2011-04-08 ENCOUNTER — Inpatient Hospital Stay (HOSPITAL_COMMUNITY): Payer: Federal, State, Local not specified - PPO

## 2011-04-08 ENCOUNTER — Ambulatory Visit (HOSPITAL_COMMUNITY)
Admission: EM | Admit: 2011-04-08 | Discharge: 2011-04-08 | Disposition: A | Discharge status: Other Acute Inpatient Hospital | Payer: Federal, State, Local not specified - PPO | Source: Other Acute Inpatient Hospital | Attending: Emergency Medicine | Admitting: Emergency Medicine

## 2011-04-08 DIAGNOSIS — J449 Chronic obstructive pulmonary disease, unspecified: Secondary | ICD-10-CM | POA: Insufficient documentation

## 2011-04-08 DIAGNOSIS — E278 Other specified disorders of adrenal gland: Secondary | ICD-10-CM

## 2011-04-08 DIAGNOSIS — T7840XA Allergy, unspecified, initial encounter: Secondary | ICD-10-CM | POA: Insufficient documentation

## 2011-04-08 DIAGNOSIS — M1990 Unspecified osteoarthritis, unspecified site: Secondary | ICD-10-CM | POA: Insufficient documentation

## 2011-04-08 DIAGNOSIS — J4489 Other specified chronic obstructive pulmonary disease: Secondary | ICD-10-CM | POA: Insufficient documentation

## 2011-04-08 DIAGNOSIS — K589 Irritable bowel syndrome without diarrhea: Secondary | ICD-10-CM | POA: Insufficient documentation

## 2011-04-08 DIAGNOSIS — E279 Disorder of adrenal gland, unspecified: Secondary | ICD-10-CM

## 2011-04-08 DIAGNOSIS — R911 Solitary pulmonary nodule: Secondary | ICD-10-CM | POA: Insufficient documentation

## 2011-04-08 DIAGNOSIS — Z79899 Other long term (current) drug therapy: Secondary | ICD-10-CM | POA: Insufficient documentation

## 2011-04-08 DIAGNOSIS — E039 Hypothyroidism, unspecified: Secondary | ICD-10-CM | POA: Insufficient documentation

## 2011-04-08 DIAGNOSIS — IMO0001 Reserved for inherently not codable concepts without codable children: Secondary | ICD-10-CM | POA: Insufficient documentation

## 2011-04-08 DIAGNOSIS — R109 Unspecified abdominal pain: Secondary | ICD-10-CM | POA: Insufficient documentation

## 2011-04-08 DIAGNOSIS — E876 Hypokalemia: Secondary | ICD-10-CM | POA: Insufficient documentation

## 2011-04-08 DIAGNOSIS — X58XXXA Exposure to other specified factors, initial encounter: Secondary | ICD-10-CM | POA: Insufficient documentation

## 2011-04-08 DIAGNOSIS — K219 Gastro-esophageal reflux disease without esophagitis: Secondary | ICD-10-CM | POA: Insufficient documentation

## 2011-04-08 DIAGNOSIS — L989 Disorder of the skin and subcutaneous tissue, unspecified: Secondary | ICD-10-CM | POA: Insufficient documentation

## 2011-04-08 HISTORY — DX: Other specified disorders of adrenal gland: E27.8

## 2011-04-08 HISTORY — DX: Solitary pulmonary nodule: R91.1

## 2011-04-08 HISTORY — DX: Disorder of adrenal gland, unspecified: E27.9

## 2011-04-08 LAB — COMPREHENSIVE METABOLIC PANEL
Albumin: 3.6 g/dL (ref 3.5–5.2)
BUN: 8 mg/dL (ref 6–23)
CO2: 29 mEq/L (ref 19–32)
Chloride: 94 mEq/L — ABNORMAL LOW (ref 96–112)
Creatinine, Ser: 0.67 mg/dL (ref 0.4–1.2)
GFR calc non Af Amer: >60 mL/min (ref >60)
Total Bilirubin: 0.3 mg/dL (ref 0.3–1.2)

## 2011-04-08 LAB — CBC
MCH: 29.5 pg (ref 26.0–34.0)
MCV: 86.8 fL (ref 78.0–100.0)
Platelets: 507 10*3/uL — ABNORMAL HIGH (ref 150–400)
RBC: 4.54 MIL/uL (ref 3.87–5.11)

## 2011-04-08 LAB — DIFFERENTIAL
Eosinophils Absolute: 0.1 10*3/uL (ref 0.0–0.7)
Lymphs Abs: 2.8 10*3/uL (ref 0.7–4.0)
Monocytes Relative: 7 % (ref 3–12)
Neutrophils Relative %: 71 % (ref 43–77)

## 2011-04-08 LAB — LIPASE, BLOOD: Lipase: 15 U/L (ref 11–59)

## 2011-04-08 LAB — T3 UPTAKE: T3 Uptake Ratio: 35.2 % (ref 22.5–37.0)

## 2011-04-08 LAB — URINALYSIS, ROUTINE W REFLEX MICROSCOPIC
Bilirubin Urine: NEGATIVE
Hgb urine dipstick: NEGATIVE
Ketones, ur: NEGATIVE mg/dL
Protein, ur: NEGATIVE mg/dL
Specific Gravity, Urine: 1.008 (ref 1.005–1.030)
Urobilinogen, UA: 0.2 mg/dL (ref 0.0–1.0)

## 2011-04-08 LAB — TSH: TSH: 0.744 u[IU]/mL (ref 0.350–4.500)

## 2011-04-08 MED ORDER — IOHEXOL 300 MG/ML  SOLN
125.0000 mL | Freq: Once | INTRAMUSCULAR | Status: AC | PRN
  Administered 2011-04-08: 125 mL via INTRAVENOUS

## 2011-04-10 ENCOUNTER — Telehealth: Payer: Self-pay | Admitting: Family Medicine

## 2011-04-10 ENCOUNTER — Encounter: Payer: Self-pay | Admitting: Family Medicine

## 2011-04-10 NOTE — Telephone Encounter (Signed)
Pts daughter is asking to speak with Dr. Perley Jain about getting pt transferred to [facility], is currently at behavioral health but is having some physical problems that needs to be addressed & daughter wants Dr. Perley Jain to be assigned to her care.

## 2011-04-10 NOTE — Telephone Encounter (Signed)
Is very concerned about her sister that is now at [facility].  They have taken her off of all her meds and she is scared that she will have a stroke or heart attack.  Wants to talk to Dr McDiarmid about this.

## 2011-04-10 NOTE — Telephone Encounter (Signed)
[facility] called just after her sister did and they made a HFU appt w/ McDiarmid for this Thurs, 6/7

## 2011-04-10 NOTE — Telephone Encounter (Signed)
I spoke with Gabriella [name].  She informed me of her concern that her mother is having trouble with rash, swelling, n/v/d at behavioral health center.  She is not certain why her mother is at [facility]. She is also concerned that there have been many changes in her medications.   I told Gabriella [name] that I would be glad to admit [name] onto the FMTS to assess the multiple concerns and complaints. I asked that she have Behavioral [facility] contact me about the admission of [name] onto the FMTS.

## 2011-04-10 NOTE — Progress Notes (Signed)
  Subjective:     [name] is a 60 y.o. female who presents for evaluation of a rash involving the face. Rash started a few days ago. Lesions are pink, and raised in texture. Rash has not changed over time. Rash causes no discomfort. Associated symptoms: none. Patient denies: abdominal pain, congestion, cough, decrease in appetite, fever, headache, myalgia, nausea, sore throat and vomiting. Patient has not had contacts with similar rash. Patient has had new exposures (soaps, lotions, laundry detergents, foods, medications, plants, insects or animals).  The following portions of the patient's history were reviewed and updated as appropriate: allergies, current medications, past family history, past medical history, past social history, past surgical history and problem list.  Review of Systems Pertinent items are noted in HPI.    Objective:    BP 114/79  Pulse 62  Wt 214 lb (97.07 kg) General:  alert, cooperative and no distress  Skin:  normal and hives along forehead and neck as well as upper back     Assessment:    Contact Dermatitis - Hives after using OTC scabies treatment PLUS Rx scabies treatment at the same time. No red flags. Improving. Patient upset because she is going to a family reunion tomorrow.     Plan:    Continue antihistamine. Add Zantac. RX 1% Hydrocortisone. Okay to apply ice to biggest hives. Reassured patient that they will go away and that they are not noticable unless she points them out. Patient seemed more reassured before leaving the office. Note: patient does NOT seem like a good candidate for prednisone.

## 2011-04-10 NOTE — Telephone Encounter (Signed)
FYI to MD

## 2011-04-11 NOTE — Telephone Encounter (Signed)
[phone]

## 2011-04-11 NOTE — Telephone Encounter (Signed)
To MD

## 2011-04-11 NOTE — Telephone Encounter (Signed)
PC to [facility] at [phone].  Pt staying with her youngest dgt since D/C from [facility]. Pt related recent illness events including hospitalization at [facility]. Still with burping and nausea.  OK to use Bentyl 20-40 mg QID per oral prn I will see patient tomorrow at 2 pm to review medications, request for hospital bed, fill out [facility] POA, assess hydration and efficacy of nausea medication.

## 2011-04-11 NOTE — Telephone Encounter (Signed)
Misty Stanley calling back to let Dr. Perley Jain know pt was released from behavioral health yesterday & she wants to know what to do to have pt admitted?

## 2011-04-12 ENCOUNTER — Ambulatory Visit (INDEPENDENT_AMBULATORY_CARE_PROVIDER_SITE_OTHER): Payer: Federal, State, Local not specified - PPO | Admitting: Family Medicine

## 2011-04-12 ENCOUNTER — Other Ambulatory Visit: Payer: Self-pay | Admitting: Family Medicine

## 2011-04-12 VITALS — BP 172/87 | HR 101 | Temp 97.9°F | Ht 64.0 in | Wt 202.3 lb

## 2011-04-12 DIAGNOSIS — E279 Disorder of adrenal gland, unspecified: Secondary | ICD-10-CM

## 2011-04-12 DIAGNOSIS — K7581 Nonalcoholic steatohepatitis (NASH): Secondary | ICD-10-CM | POA: Insufficient documentation

## 2011-04-12 DIAGNOSIS — F339 Major depressive disorder, recurrent, unspecified: Secondary | ICD-10-CM

## 2011-04-12 DIAGNOSIS — K760 Fatty (change of) liver, not elsewhere classified: Secondary | ICD-10-CM

## 2011-04-12 DIAGNOSIS — K589 Irritable bowel syndrome without diarrhea: Secondary | ICD-10-CM

## 2011-04-12 DIAGNOSIS — R911 Solitary pulmonary nodule: Secondary | ICD-10-CM

## 2011-04-12 DIAGNOSIS — F411 Generalized anxiety disorder: Secondary | ICD-10-CM

## 2011-04-12 DIAGNOSIS — G894 Chronic pain syndrome: Secondary | ICD-10-CM

## 2011-04-12 DIAGNOSIS — D3500 Benign neoplasm of unspecified adrenal gland: Secondary | ICD-10-CM

## 2011-04-12 DIAGNOSIS — E278 Other specified disorders of adrenal gland: Secondary | ICD-10-CM

## 2011-04-12 DIAGNOSIS — L299 Pruritus, unspecified: Secondary | ICD-10-CM

## 2011-04-12 HISTORY — DX: Fatty (change of) liver, not elsewhere classified: K76.0

## 2011-04-12 MED ORDER — LORAZEPAM 1 MG PO TABS: 1.0000 mg | ORAL_TABLET | Freq: Three times a day (TID) | ORAL | Status: DC

## 2011-04-12 NOTE — Assessment & Plan Note (Signed)
Anxiety seems to be a chronic significant part of patient's mental health issues.  She has been on Ativan for many years.  I am hesitant for her to stop this part of her regiment without consultation with her Psychiatrist, Dr Betti Cruz.  I asked Ms [name] to restart her ativan 1 mg three times a day until she sees Dr Betti Cruz next week.

## 2011-04-12 NOTE — Assessment & Plan Note (Signed)
Her pain seems undercontrol with just the MS Contin 15 mg BID without oxycodone IR that was stopped during her [facility] admission.  Will continue the MS Contin 15 mg BID.

## 2011-04-12 NOTE — Assessment & Plan Note (Signed)
Suspect that this burping and constipation and diarrhea episode is a manifestation of her function GI disorder.  Ms [name] has had similar episode in the past that spontaneously resolved with just symptomatic care with prochlorperazine and dicyclomine.  She may take short term prochlorperazine 10 mg mg per oral up to 4 times a day as needed for nausea and Bentyl 20 - 40 mg per oral up to 4 times a day as needed for burping.

## 2011-04-12 NOTE — Patient Instructions (Addendum)
Restart Lorazepam one milligram three times a day. Restart your metoprolol 50 mg twice a day Take time to fill out Living Will and get the [facility] to notarize it  Your medicines for your stomach are Prochlorperazine and Bentyl.  They will help with belching and nausea.  These medicine's are your stomach's friends.   You have a very tiny nodule 4 mm) just below your right lung.  Do not worry about it.  If you start to have symptoms from your lungs then we will worry about it, otherwise we will recheck in one year.   You have a right adrenal adenoma, a benign growth in your right adrenal gland. We do not worry about it.

## 2011-04-12 NOTE — Assessment & Plan Note (Signed)
Will monitor 4 mm right lung base nodule with chest CT without contrast in 6 months.

## 2011-04-12 NOTE — Progress Notes (Signed)
  Subjective:    Patient ID: [name], female    DOB: 1951/01/03, 60 y.o.   MRN: [id number]  HPI Pt is accompanied by her dgt [name] and later in the visit by her [name].  Unfortunate sequence of events starting with [name]'s diagnosis with scabies.  She became fearful of the scabies, Taking multiple scabicide treatments, visiting Dr Terri Piedra ([place]) about it.  She scrubbed down her shower because of the her concern about exterminating the scabies.  She developed a chemical burn of her hands from the cleanser used to wash the shower.  She was seen in ED with the burns.  She developed an exacerbation of her functional bowel disorder that usually expresses itself with nausea and diarrhea/constipation and frequent burping.  She did not use her prochlorperazine, but did use her Bentyl.  She starting thinking more about death, particularly the recent multiple deaths of children in her family.  Her mood became more sad, without thoughts of self-harm.  She presented herself to [facility] with complaint of worsening depression.  During her [facility] admission she was sent to [facility] ED for medical clearance given her GI symtpoms and the patient's concern that she had an intestinal blockage.  A CT of abdomen/pelvis w/ CM did not show obstruction.  During the [facility] admission, her oxycodone was stopped along with her ativan.  She was given Librium taper that ended yesterday.  Her Metoprolol was stopped.  She was discharged two days ago from [facility].  She has been staying with her dgt [name] since discharge. Her appetite has improved having eaten cube steak and mash potatoes last nite without difficulty.  She had a soft, well-formed stool yesterday without difficulty.       Review of Systems  Respiratory: Negative for shortness of breath.   Gastrointestinal: Negative for vomiting, abdominal pain, blood in stool and abdominal distention.  Genitourinary: Positive for vaginal discharge (recent hx of fishy smelling  discharge that has resolved. ). Negative for dysuria and difficulty urinating.  Musculoskeletal: Positive for back pain (3/10 chronic back pain currently.).  Psychiatric/Behavioral: Positive for sleep disturbance (slept 6 hours last night) and decreased concentration. Negative for suicidal ideas and confusion. The patient is nervous/anxious.         Objective:   Physical Exam  Constitutional:       Frequent eructation Only minimally distresed Non-toxic looking  Cardiovascular: Normal rate, regular rhythm, normal heart sounds and intact distal pulses.   Pulmonary/Chest: Effort normal and breath sounds normal.  Abdominal: Soft. Bowel sounds are normal. She exhibits no distension and no mass. There is no tenderness. There is no rebound and no guarding.  Musculoskeletal: She exhibits no edema.  Skin: Skin is warm and dry.  Psychiatric: Her speech is normal and behavior is normal. Judgment and thought content normal. Her mood appears anxious. Cognition and memory are normal.          Assessment & Plan:

## 2011-04-12 NOTE — Assessment & Plan Note (Signed)
Patient not currently on Oleptro.  It was stopped during [facility] admission. No antidepressant was started.  Pt has appointment with Dr Betti Cruz on 04/17/11.   Pt is not currently suidicidal and rates her Depression a 5 out of 10.

## 2011-04-12 NOTE — Assessment & Plan Note (Signed)
No known history of malignancy. Size is less than 4 cm diameter. Intermediate risk on nonadenoma based on CT attenuation values  No symptoms of infection, including TB.  Plan to repeat CT in 6 months

## 2011-04-13 ENCOUNTER — Encounter: Payer: Federal, State, Local not specified - PPO | Admitting: Family Medicine

## 2011-04-13 NOTE — Progress Notes (Signed)
Addended by: Acquanetta Belling, MD D on: 04/13/2011 08:18 AM   Modules accepted: Orders

## 2011-04-14 NOTE — Discharge Summary (Signed)
[name], [name]                ACCOUNT NO.:  [id number]  MEDICAL RECORD NO.:  [id number]  LOCATION:  0304                          FACILITY:  [facility]  PHYSICIAN:  Franchot Gallo, MD     DATE OF BIRTH:  10/06/1951  DATE OF ADMISSION:  04/06/2011 DATE OF DISCHARGE:  04/10/2011                              DISCHARGE SUMMARY   REASON FOR ADMISSION:  This is a 60 year old female that was admitted with a history of depression, but not actively suicidal.  She was treated for scabies.  She was grieving her nieces and nephews who were killed in November of 2011.  FINAL DIAGNOSES:  Axis I:  Major depressive disorder, recurrent THC abuse. Axis II: Deferred. Axis III:  Multiple health issues, osteoarthritis, chronic obstructive pulmonary disease, chronic pain, restless leg, hypothyroidism, osteoporosis, and hypertension. Axis IV: Severe grief issues, possible homelessness, medical issues. Axis V: 50-55.  PERTINENT LABS:  Urinalysis was negative.  BMET within normal limits. No measurable alcohol.  Urine drug screen was positive for opiates and cannabis.  PERTINENT FINDINGS:  This is a female who was cleared in the emergency room with a history of scabies and was given medication.  We reviewed her medications and checked a TSH.  She was on the substance abuse program.  She was very calm and cooperative, requiring help with her ADLs.  She was endorsing problems with sleep and appetite and having severe depressive symptoms.  Denied any auditory hallucinations, but problems with ongoing pain and moderate anxiety.  We contacted [name], her support, to address any safety issues and to provide information on suicide and risk factors.  The patient was agreeable and talked about her work at [employer], willing to get help.  She denied any significant depression.  On day of discharge, the patient was requesting discharge to go home with her daughter.  Her sleep was good.  Her appetite was good.  She  was evaluated the day prior for a bowel obstruction in the emergency room with no obstruction found.  She was a rating her depression 3 to 4 on a scale of 1 to 10.  Denied any suicidal or homicidal thoughts, auditory or visual hallucinations.  Her anxiety was under good control with the Ativan.  She did not wish to start back on an antidepressant, would rather wait until she follows up with her outpatient provider.  She was having no other medication side effects.  DISCHARGE MEDICATIONS: 1. Librium 25 mg, taking one that evening and one the following     morning and then stop. 2. Vistaril 50 mg, taking three at bedtime as needed. 3. Multivitamin daily. 4. Altace 10 mg daily. 5. Aspirin 81 mg daily. 6. Calcium 800 mg taking one twice a day. 7. Hydrochlorothiazide 25 mg daily. 8. MS Contin 15 mg one b.i.d. 9. Omeprazole one b.i.d. 10.Synthroid 50 mcg daily. 11.The patient is to stop taking her Ativan and oxycodone.  FOLLOW UP:  Her follow-up appointment was with the Dr. Betti Cruz on April 17, 2011.  She had a therapist appointment with Marius Ditch on June 5th at 5:00 p.m., and with [facility] on June 7th.  She was also to  call the hospice for grief and loss at [phone].     Landry Corporal, N.P.   ______________________________ Franchot Gallo, MD    JO/MEDQ  D:  04/12/2011  T:  04/12/2011  Job:  [id number]  Electronically Signed by Limmie PatriciaP. on 04/14/2011 09:06:37 AM Electronically Signed by Franchot Gallo MD on 04/14/2011 06:12:32 PM

## 2011-04-17 ENCOUNTER — Other Ambulatory Visit: Payer: Self-pay | Admitting: Family Medicine

## 2011-04-17 ENCOUNTER — Telehealth: Payer: Self-pay | Admitting: Family Medicine

## 2011-04-17 DIAGNOSIS — F411 Generalized anxiety disorder: Secondary | ICD-10-CM

## 2011-04-17 MED ORDER — LORAZEPAM 1 MG PO TABS: 1.0000 mg | ORAL_TABLET | Freq: Three times a day (TID) | ORAL | Status: AC

## 2011-04-17 NOTE — Telephone Encounter (Signed)
Please let Ms [name] know she may pick up a 7 day supply of her ativan on 04/21/11 at her pharmacy.

## 2011-04-17 NOTE — Telephone Encounter (Signed)
Patient informed, expressed understanding. 

## 2011-04-17 NOTE — Telephone Encounter (Signed)
[name] called stating that she has enough Ativan to carry her through this Friday, but since her Psychiatrist cancelled her appt, she needs a refill from Dr. Perley Jain to carry her through 6/21.

## 2011-04-21 NOTE — H&P (Signed)
[name], [name]                ACCOUNT NO.:  [id number]  MEDICAL RECORD NO.:  [id number]  LOCATION:  0304                          FACILITY:  [facility]  PHYSICIAN:  Franchot Gallo, MD     DATE OF BIRTH:  December 12, 1950  DATE OF ADMISSION:  04/06/2011 DATE OF DISCHARGE:                      PSYCHIATRIC ADMISSION ASSESSMENT   This is a voluntary admission to the services of Dr. Harvie Heck [place]. This is a 60 year old separated white female.  She originally presented over here at behavioral health.  She was sent for medical clearance.  She has been depressed but not actively suicidal at this time. She was recently treated for scabies.  She also states that she is still actively grieving her nieces and nephews who were killed in [place]. This is back in November.  Apparently recently there was a family reunion for [facility]. This dredged up a lot of issues regarding her memories of the nieces and nephews.  She has also been staying with her son and his family.  She contracted scabies.  She has had several treatments for this of late and she just does not feel like she is stable at this point in time.  She is very depressed, although she denies being suicidal. She has been suicidal in the past with gestures and attempts but not at this time.  PAST PSYCHIATRIC HISTORY:  She states that she has been followed by Dr. Betti Cruz since 1996.  She has had admissions here for almost 10 years on and off and her last admission into the hospital here was back in 2009.  SOCIAL HISTORY:  She is separated.  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  FAMILY HISTORY:  She states her father had alcohol abuse.  SUBSTANCE ABUSE HISTORY:  She does smoke marijuana.  She always has. She is still positive for marijuana.  PRIMARY CARE PROVIDER:  Dr. Jacquelyne Balint.  PSYCHIATRIST:  Dr. Betti Cruz.  MEDICAL PROBLEMS:  She reports that she has problems with  arthritis, COPD, chronic pain, depression, fibromyalgia, hypothyroidism, osteoarthritis, restless leg.  MEDICATIONS:  She gets the majority of her medications through the mail order. This is the list that came over from the emergency room.  ALLERGIES:  Are to aspirin (nausea and vomiting). Prednisone exacerbates her depression. Arthrotek caused hypertension. Naprosyn, Paxil, Nardil, Abilify and Propulsid reaction was unknown.  CURRENTLY PRESCRIBED MEDS:  Vitamin D over the counter 1 tablet p.o. at bedtime, Vistaril 50 mg 3 caps daily at bedtime, Tylenol Extra Strength 2 tablets q.6 h p.r.n. 500 mg, Synthroid 50 mcg 3 tablets per day, oxycodone CR 10 mg 1 tablet b.i.d. for breakthrough pain, omeprazole 20 mg one cap b.i.d., MS Contin 15 mg 1 tablet b.i.d., hydrochlorothiazide 25 mg 1 tablet p.o. daily, Compazine 10 mg q.6 h p.r.n., Allegra 180 mg over-the-counter 1 tablet daily, Altace 10 mg p.o. daily, Bentyl 20 mg 0.5 to 1 tablet 4 times a day, calcium 600 mg b.i.d. Ativan 1 mg 1-2 tablets 4 times a day and aspirin 81 mg p.o. daily.  PHYSICAL EXAM:  As already stated she was medically cleared in the ED. She was afebrile  97.6 to 98.1.  Her pulse was 60-82, respirations were 16-20 and blood pressure was 101/65 to 138/80.  Her CBC showed an elevated WBC at 15.7. Her urinalysis was negative.  She had no abnormalities of BMET.  She had no measurable alcohol.  Her UDS was positive for opiates she is prescribed and the [facility] she is not prescribed.  MENTAL STATUS EXAM:  Tonight she is alert and oriented.  She was seen in her room in bed.  She appeared to be just moderately uncomfortable. This was due to being constipated.  She does not have any burrows or indications of active scabies, although she is applying Premarin cream for comfort. Her speech was not pressured.  Her mood is depressed as is her affect. Her thought processes are clear, rational and goal oriented. Judgment and insight  are intact.  Concentration and memory are intact. Intelligence is at least average.  DIAGNOSES: 1. AXIS I:  Major depressive disorder.  THC abuse. 2. AXIS II:  Deferred. 3. AXIS III:  Multiple health issues: Arthritis, COPD, chronic pain,     restless leg, hypothyroidism, osteo, hypertension. 4. AXIS IV:  Severe.  She needs a place to stay. 5. AXIS V:  35.  PLAN:  Admit for safety and stabilization.  We will check on her TSH and adjust her meds as indicated.  She already has outpatient care so when she feels stabilized we can discharge to the community.     Mickie Leonarda Salon, P.A.-C.   ______________________________ Franchot Gallo, MD    MD/MEDQ  D:  04/06/2011  T:  04/06/2011  Job:  [id number]  Electronically Signed by Jaci Lazier ADAMS P.A.-C. on 04/20/2011 07:34:29 PM Electronically Signed by Franchot Gallo MD on 04/21/2011 04:25:34 PM

## 2011-04-24 ENCOUNTER — Encounter: Payer: Self-pay | Admitting: Family Medicine

## 2011-04-24 ENCOUNTER — Ambulatory Visit (INDEPENDENT_AMBULATORY_CARE_PROVIDER_SITE_OTHER): Payer: Federal, State, Local not specified - PPO | Admitting: Family Medicine

## 2011-04-24 DIAGNOSIS — K589 Irritable bowel syndrome without diarrhea: Secondary | ICD-10-CM

## 2011-04-24 DIAGNOSIS — G2581 Restless legs syndrome: Secondary | ICD-10-CM

## 2011-04-24 MED ORDER — DICYCLOMINE HCL 20 MG PO TABS: 40.0000 mg | ORAL_TABLET | Freq: Four times a day (QID) | ORAL | Status: DC

## 2011-04-24 MED ORDER — OMEPRAZOLE 20 MG PO CPDR: 20.0000 mg | DELAYED_RELEASE_CAPSULE | Freq: Every day | ORAL | Status: DC

## 2011-04-24 MED ORDER — METOPROLOL TARTRATE 50 MG PO TABS: 50.0000 mg | ORAL_TABLET | Freq: Two times a day (BID) | ORAL | Status: DC

## 2011-04-24 MED ORDER — LEVOTHYROXINE SODIUM 50 MCG PO TABS: 150.0000 ug | ORAL_TABLET | Freq: Every day | ORAL | Status: DC

## 2011-04-24 MED ORDER — HYDROCODONE-ACETAMINOPHEN 5-500 MG PO TABS: 1.0000 | ORAL_TABLET | Freq: Four times a day (QID) | ORAL | Status: DC | PRN

## 2011-04-25 ENCOUNTER — Encounter: Payer: Self-pay | Admitting: Family Medicine

## 2011-04-25 NOTE — Assessment & Plan Note (Signed)
Burping and diarrhea have declined significantly.  Her appetitie has returned.  Her weight is up 5 pounds from a little over a week ago. She is using prochloroperazine only occasionally.  She does use her Bentyl regularly.  No adverse effects noted form medications.  Plan to continue current treatment and recheck in one month.

## 2011-04-25 NOTE — Progress Notes (Signed)
  Subjective:    Patient ID: [name], female    DOB: Sep 10, 1951, 60 y.o.   MRN: [id number]  HPI IRRITABLE BOWEL SYNDROME  She has been taking hort term prochlorperazine 10 mg mg about every other day for nausea and Bentyl 20 - 40 mg per oral up to 4 times a day.  Her nausea and burping are much imroved.  She has been eating well.  No constipation or diarrhea.  She denies Tics, excess sedation, incoordination.   ANXIETY Improved with Ativan 1 mg three times a day.  Ms [name] will see Dr Reddy's Rochel Brome. On 04/27/11 to discuss continuing on the Ativan.  She denies problems with concentrating. She is continuing to staywith her dgt, [name], and is uncertain if she will return to staying with her son.  She is seeing her counselor, [name], twice a week for now.   CHRONIC PAIN SYNDROME - [name] 04/12/2011 4:02 PM Signed  Her pain seems undercontrol with just the MS Contin 15 mg BID without oxycodone IR that was stopped during her [facility] admission. Will continue the MS Contin 15 mg BID.   Her hip pain has been control with just APAP for breakthru pain.  DEPRESSION, MAJOR, RECURRENT - [name] 04/12/2011 4:05 PM Signed  Patient not currently on Oleptro. It was stopped during [facility] admission. No antidepressant was started.  Pt has appointment with Dr Reddy's PA on 04/27/11 to discuss restarting the Oleptrol Pt is not currently suidicidal.  She continues to rate her Depression a 5 out of 10.   Restless Leg Syndrome This has worsened significantly since leaving [facility] and stopping her oxycodone.  She is having symptoms of "leg jumping" and need to move that is particularly worse when she lies down to go to bed at night. It has impaired her sleep significantly.  She is very frustrated by this inability to fall asleep bc of the leg symptoms.    Review of Systems  Constitutional: Positive for appetite change (improved). Negative for fever.  Gastrointestinal: Negative for abdominal distention.    Genitourinary: Negative for dysuria, urgency and vaginal discharge.       No odor  Neurological: Negative for dizziness, syncope, weakness and light-headedness.       Objective:   Physical Exam  Constitutional: She is oriented to person, place, and time.       Obese, only occasional burping.   Eyes: Pupils are equal, round, and reactive to light.  Cardiovascular: Normal rate, regular rhythm, normal heart sounds and intact distal pulses.   Pulmonary/Chest: Effort normal and breath sounds normal.  Abdominal: Soft. Bowel sounds are normal. She exhibits no distension. There is no tenderness. There is no rebound and no guarding.  Musculoskeletal: She exhibits no edema.  Neurological: She is alert and oriented to person, place, and time.  Psychiatric: Her speech is normal and behavior is normal. Judgment and thought content normal. Her affect is not inappropriate. She is not slowed. Thought content is not paranoid. Cognition and memory are normal. She does not exhibit a depressed mood. She exhibits normal recent memory. She is attentive.          Assessment & Plan:

## 2011-04-25 NOTE — Assessment & Plan Note (Signed)
I am hesitant to start a dopamine agonist for pt's RLS given the significant possiblity of GI upset in this patient with significant functional GI issues.  She did not tolerate neurontin when it was started during her recent [facility] admission, so the new long-acting formulation of gabapentin enacarbil approved for RLS would likely cause a similar problem as would Lyrica.    I know that oxycodone that patient was taking for her hip pain when she works helped her RLS sxs.   Plan: Trial of hydrocodone/APAP 5/325 at bedtime prn to help decrease RLS sxs and improve sleep quality.  We could try trial of dopamine agonist in future once her GI s;ystem has settled down for a few months. Mirapex seems to have less probability of GI problems than ropinirole.

## 2011-04-26 ENCOUNTER — Telehealth: Payer: Self-pay | Admitting: Family Medicine

## 2011-04-26 NOTE — Telephone Encounter (Signed)
States right  foot started swelling last night . Swelling is improved this AM . Advised to keep foot elevated as much as possible.  Re : throat, she denies any drainage, fever. Advised warm salt ware gargle. Warm liquids to drink, throat losenges. Appointment scheduled tomorrow with PCP.

## 2011-04-26 NOTE — Telephone Encounter (Signed)
Pt asking RN to call her with home advice, foot has been swollen & has a scratchy throat, does not think she needs to be seen but would like to know what she can do at home.

## 2011-04-27 ENCOUNTER — Encounter: Payer: Self-pay | Admitting: Family Medicine

## 2011-04-27 ENCOUNTER — Ambulatory Visit (INDEPENDENT_AMBULATORY_CARE_PROVIDER_SITE_OTHER): Payer: Federal, State, Local not specified - PPO | Admitting: Family Medicine

## 2011-04-27 VITALS — BP 149/84 | HR 76 | Temp 98.5°F | Wt 204.0 lb

## 2011-04-27 DIAGNOSIS — M25571 Pain in right ankle and joints of right foot: Secondary | ICD-10-CM

## 2011-04-27 DIAGNOSIS — M25579 Pain in unspecified ankle and joints of unspecified foot: Secondary | ICD-10-CM

## 2011-04-27 DIAGNOSIS — M79671 Pain in right foot: Secondary | ICD-10-CM

## 2011-04-27 DIAGNOSIS — M79609 Pain in unspecified limb: Secondary | ICD-10-CM

## 2011-04-27 DIAGNOSIS — J029 Acute pharyngitis, unspecified: Secondary | ICD-10-CM

## 2011-04-27 LAB — SEDIMENTATION RATE: Sed Rate: 18 mm/hr (ref 0–22)

## 2011-04-27 LAB — URIC ACID: Uric Acid, Serum: 5.1 mg/dL (ref 2.4–7.0)

## 2011-04-27 LAB — POCT RAPID STREP A (OFFICE): Rapid Strep A Screen: NEGATIVE

## 2011-04-27 MED ORDER — PROCHLORPERAZINE MALEATE 10 MG PO TABS: 10.0000 mg | ORAL_TABLET | Freq: Four times a day (QID) | ORAL | Status: DC | PRN

## 2011-04-27 NOTE — Progress Notes (Signed)
  Subjective:    Patient ID: [name], female    DOB: 05-08-1951, 60 y.o.   MRN: [id number]  HPI SORE THROAT   Onset: 04/24/11  Severity: moderate  Better with: salt-water gargle   Symptoms  Fever: no    Cough/URI sxs: no Myalgias: no Headache: no Rash: no Swollen neck glands: no    Recent Strep Exposure: no  Heartburn/brash: yes Allergy Sxs: no  Red Flags Breathing difficulty: no Drooling: no Trismus: no  Right Ankle Pain Onset: 2 days ago Course: resolving Pattern: recurrent.  Prior episode in right ankle 2 weeks ago at [facility] admission.  Jiggling ankles a lot secondary to worsening of RLS during admission.  Assoc: focal swelling with erythema and pain at right foot instep  Relief: pain improved with Vidodin, IcE and elevation. Worse with walking and dependent position of foot. No known trauma to foot.  No hx of similar sxs prior to two weeks ago. No surgery to foot.  No fever/chills.  No known hx of gout.  Not sexually active. (+) Prediabetic.   Vicodin did interrupt episode of RLS exacerbation last night that allowed her to sleep thru the night without interruption.       Review of Systems See HPI     Objective:   Physical Exam  Constitutional:       NAD, though tearful during while talking about the 7 month anniversary of the death of her nieces and nephews. She was consolable.  Mildly atalgic gait.    HENT:  Right Ear: Tympanic membrane normal.  Left Ear: Tympanic membrane normal.  Mouth/Throat: Oropharynx is clear and moist and mucous membranes are normal. No oropharyngeal exudate, posterior oropharyngeal edema or posterior oropharyngeal erythema.    Musculoskeletal:       Right ankle: She exhibits no swelling. tenderness (tender on right fooot instep but not focal.  No podagra. ). No lateral malleolus and no medial malleolus tenderness found.       Feet:          Assessment & Plan:

## 2011-04-27 NOTE — Assessment & Plan Note (Signed)
Negative rapid strep. Suspect GERD related symptom. Increase omeprazole to 40 mg BID for two weeks. Take Tums.  RTC in 2 weeks to assess response. Do not think it is related to left ankle pain ( like reactive arthritis b/c too soon after onset)

## 2011-04-27 NOTE — Assessment & Plan Note (Signed)
Suspect this acute recurrent right instep pain may be due to a crystal arthropathy like gout.  Do not have hx of trauma.  Will check Uric Acid and ESR  Empiric trial of Ibuprofen with compazine to avoid NSAID-associated nausea pt has had in past with NSIADs  RTC in 2 Weeks.  If unimproved, may need Xray.

## 2011-04-27 NOTE — Patient Instructions (Signed)
We are checking your Uric Acid blood level to see if this right foot pain could be from gout.   If the foot pain returns, keep the foot elevated, keep ice on the painful area and take one compazine tablet then 30 minutes later take 2 Ibuprofen (Advil, Motrin) tablets.  You can repeat the Compazine & Ibuprofen every 6 hours if needed for continued foot pain.   Your throat pain may be from acid reflux.  Increase your omeprazole to one tablet twice a day.  Also use Tums Extra-strength tablets as needed for indigestion or heartburn.   Call the [facility] tomorrow with any changes in your medications by Dr Reddy's office.

## 2011-04-28 ENCOUNTER — Encounter: Payer: Self-pay | Admitting: Family Medicine

## 2011-04-28 ENCOUNTER — Telehealth: Payer: Self-pay | Admitting: Family Medicine

## 2011-04-28 DIAGNOSIS — Z8614 Personal history of Methicillin resistant Staphylococcus aureus infection: Secondary | ICD-10-CM

## 2011-04-28 HISTORY — DX: Personal history of Methicillin resistant Staphylococcus aureus infection: Z86.14

## 2011-04-28 NOTE — Telephone Encounter (Signed)
FYI

## 2011-04-28 NOTE — Progress Notes (Signed)
This encounter was created in error - please disregard.  This encounter was created in error - please disregard.

## 2011-04-28 NOTE — Assessment & Plan Note (Addendum)
Normal uric acid level makes acute gouty arthritis less likely.

## 2011-04-28 NOTE — Telephone Encounter (Signed)
Pt was seen by psychiatrist & was started back on her antidepressant, oleptro 300 mg at bedtime.

## 2011-05-05 ENCOUNTER — Encounter: Payer: Self-pay | Admitting: *Deleted

## 2011-05-05 NOTE — Telephone Encounter (Signed)
This encounter was created in error - please disregard.

## 2011-05-08 NOTE — Progress Notes (Signed)
This encounter was created in error - please disregard.

## 2011-05-11 ENCOUNTER — Other Ambulatory Visit: Payer: Self-pay | Admitting: Family Medicine

## 2011-05-11 ENCOUNTER — Telehealth: Payer: Self-pay | Admitting: Family Medicine

## 2011-05-11 ENCOUNTER — Ambulatory Visit
Admission: RE | Admit: 2011-05-11 | Discharge: 2011-05-11 | Disposition: A | Discharge status: Home / Self Care | Payer: Federal, State, Local not specified - PPO | Source: Ambulatory Visit | Attending: Family Medicine | Admitting: Family Medicine

## 2011-05-11 ENCOUNTER — Encounter: Payer: Self-pay | Admitting: Family Medicine

## 2011-05-11 ENCOUNTER — Ambulatory Visit (INDEPENDENT_AMBULATORY_CARE_PROVIDER_SITE_OTHER): Payer: Federal, State, Local not specified - PPO | Admitting: Family Medicine

## 2011-05-11 VITALS — BP 131/79 | HR 73 | Temp 98.2°F | Ht 64.0 in | Wt 208.8 lb

## 2011-05-11 DIAGNOSIS — B001 Herpesviral vesicular dermatitis: Secondary | ICD-10-CM

## 2011-05-11 DIAGNOSIS — M79609 Pain in unspecified limb: Secondary | ICD-10-CM

## 2011-05-11 DIAGNOSIS — M79671 Pain in right foot: Secondary | ICD-10-CM

## 2011-05-11 DIAGNOSIS — Z8639 Personal history of other endocrine, nutritional and metabolic disease: Secondary | ICD-10-CM

## 2011-05-11 DIAGNOSIS — Z862 Personal history of diseases of the blood and blood-forming organs and certain disorders involving the immune mechanism: Secondary | ICD-10-CM

## 2011-05-11 DIAGNOSIS — B009 Herpesviral infection, unspecified: Secondary | ICD-10-CM

## 2011-05-11 DIAGNOSIS — R9431 Abnormal electrocardiogram [ECG] [EKG]: Secondary | ICD-10-CM

## 2011-05-11 HISTORY — DX: Herpesviral vesicular dermatitis: B00.1

## 2011-05-11 LAB — D-DIMER, QUANTITATIVE: D-Dimer, Quant: 2.57 ug/mL-FEU — ABNORMAL HIGH (ref 0.00–0.48)

## 2011-05-11 MED ORDER — POTASSIUM CHLORIDE CRYS ER 10 MEQ PO TBCR: 40.0000 meq | EXTENDED_RELEASE_TABLET | Freq: Every day | ORAL | Status: DC

## 2011-05-11 MED ORDER — VALACYCLOVIR HCL 1 G PO TABS: ORAL_TABLET | ORAL | Status: DC

## 2011-05-11 MED ORDER — VALACYCLOVIR HCL 1 G PO TABS: 1000.0000 mg | ORAL_TABLET | Freq: Two times a day (BID) | ORAL | Status: AC

## 2011-05-11 NOTE — Telephone Encounter (Signed)
I spoke with Ms [name] about elevated D-Dimer and the need to perform a venous doppler of her legs to rule out DVT.  She was agreeable to going for leg venous dopplers. dopplers.

## 2011-05-11 NOTE — Progress Notes (Signed)
Addended by: Jone Baseman D on: 05/11/2011 04:58 PM   Modules accepted: Orders

## 2011-05-11 NOTE — Progress Notes (Signed)
  Subjective:    Patient ID: [name], female    DOB: 1951/08/21, 60 y.o.   MRN: [id number]  HPI Right foot pain and swelling No recurrence since OV 2 weeks ago until last night with onset of swelling bilaterally in feet (R>>L) and pain in right forefoot. Took one Ibuprofen OTC tablet with no improvement of pain in right foot.  Had started developing hives yesterday on her abdomen prior to taking Ibuprofen. No trauma.  No pain or swelling in other joints.  No fever/night sweats.  Weight up 5 pounds from 3 weeks ago. Sore throat from 2 weeks ago is resolved.   PMH significant for Hx 4 mm Sol Pulm nodule found on CT abd early June 2012. Hypothyroidism Prediabetes Adren Nodule 1.7 cm on CT abd early June 2012 Hx of SOB with Diclofenac-misoprostol Hx allergic Rhinitiss  OA of hands OA spine Hx Spinal Disc disease with radiculopathy Hx Fibromyalgia S/P fixation pin in Left foot 5 MT and right foot 4th  And 5th MT (pins were removed) S/P repair of right foot 4th digit hammer toe.  S/P Bilateral repair of Halluxis Valgus with pins  Urticaria Began yesterday afternoon Began on abdomen, progressed to involve back and right forearm. (+)Itching.  No new medication. No new detergent.  No insect bites.  Was out at picnic yesterday. Onset prior to use of Ibuprofen yesterday.  No SOB or GI upset.   Review of Systemssee HPI     Objective:   Physical Exam  Constitutional: Vital signs are normal. She appears well-developed. She is cooperative.  Non-toxic appearance.  HENT:  Mouth/Throat:    Cardiovascular: Normal rate, regular rhythm and normal heart sounds.   Pulmonary/Chest: Effort normal and breath sounds normal.  Musculoskeletal:       Legs:      Feet:  Neurological: She is alert.       Mild atalgic gait.  Intact DP pulses bilaterally.   Psychiatric: She has a normal mood and affect. Her speech is normal and behavior is normal. Cognition and memory are normal.           Assessment & Plan:

## 2011-05-11 NOTE — Patient Instructions (Addendum)
Wear the Post-op shoe as long as foot is hurting Use Hydrocodone-Acetaminophen (Vicodin) tablets for pain Would not use the Ibuprofen given that it may cause Hives You may use your Hydroxyzine (Atarax) for HIves and itching as well as Benadryl.   If the blood test for blood clots is abnormal,  Dr Garrison Michie will ask you to get leg vein ultrasound to rule out a blood clot.  Go for your right foot Xray.

## 2011-05-11 NOTE — Assessment & Plan Note (Signed)
Right foot pain and swelling, acute, recurrent in last two weeks. Associated symptoms of urticaria. Pre-existing surgical pins in first  Metatarsal bone (about 61 years old) May have bilateral symmetric symptoms in feet but R >> L symptomatically.  Recent Uric Acid level and ESR drawn with first occurrence of pain were in normal range.   Uncertain if this is pain from bone/joint/tendon/ligament Vs pain from a focal hydrostatic edema.  Plan: D-Dimer to R/O DVT (recent hospitalization with physical and mental illness), CMET, CBC, Recheck Uric Acid, check rheumatoid factor, ANA,  Check Xray right foot.   Will not use Ibuprofen given urticaria.  Will Use patient's preexisting Vicodin pressciption  Of which 21 of 30 tablets are left by my pill count in the office today.

## 2011-05-12 ENCOUNTER — Ambulatory Visit (HOSPITAL_COMMUNITY)
Admission: RE | Admit: 2011-05-12 | Discharge: 2011-05-12 | Disposition: A | Discharge status: Home / Self Care | Payer: Federal, State, Local not specified - PPO | Source: Ambulatory Visit | Attending: Family Medicine | Admitting: Family Medicine

## 2011-05-12 DIAGNOSIS — M79609 Pain in unspecified limb: Secondary | ICD-10-CM

## 2011-05-12 DIAGNOSIS — G2581 Restless legs syndrome: Secondary | ICD-10-CM | POA: Insufficient documentation

## 2011-05-12 DIAGNOSIS — M7989 Other specified soft tissue disorders: Secondary | ICD-10-CM | POA: Insufficient documentation

## 2011-05-12 DIAGNOSIS — M109 Gout, unspecified: Secondary | ICD-10-CM | POA: Insufficient documentation

## 2011-05-12 LAB — CBC WITH DIFFERENTIAL/PLATELET
Basophils Relative: 0 % (ref 0–1)
Eosinophils Absolute: 0.1 10*3/uL (ref 0.0–0.7)
Eosinophils Relative: 1 % (ref 0–5)
MCH: 28.9 pg (ref 26.0–34.0)
MCHC: 31.7 g/dL (ref 30.0–36.0)
MCV: 91.3 fL (ref 78.0–100.0)
Neutrophils Relative %: 76 % (ref 43–77)
Platelets: 297 10*3/uL (ref 150–400)
RDW: 14.9 % (ref 11.5–15.5)

## 2011-05-12 LAB — COMPREHENSIVE METABOLIC PANEL
ALT: 16 U/L (ref 0–35)
AST: 15 U/L (ref 0–37)
Albumin: 4.1 g/dL (ref 3.5–5.2)
Alkaline Phosphatase: 75 U/L (ref 39–117)
Glucose, Bld: 96 mg/dL (ref 70–99)
Potassium: 4.4 mEq/L (ref 3.5–5.3)
Sodium: 139 mEq/L (ref 135–145)
Total Bilirubin: 0.4 mg/dL (ref 0.3–1.2)
Total Protein: 6.5 g/dL (ref 6.0–8.3)

## 2011-05-12 LAB — TSH: TSH: 0.418 u[IU]/mL (ref 0.350–4.500)

## 2011-05-12 LAB — ANTI-NUCLEAR AB-TITER (ANA TITER): ANA Titer 1: NEGATIVE

## 2011-05-12 LAB — RHEUMATOID FACTOR: Rhuematoid fact SerPl-aCnc: <10 IU/mL (ref <=14)

## 2011-05-12 LAB — URIC ACID: Uric Acid, Serum: 5.2 mg/dL (ref 2.4–7.0)

## 2011-05-12 LAB — SEDIMENTATION RATE: Sed Rate: 13 mm/hr (ref 0–22)

## 2011-05-22 ENCOUNTER — Encounter: Payer: Self-pay | Admitting: Family Medicine

## 2011-05-22 ENCOUNTER — Ambulatory Visit (INDEPENDENT_AMBULATORY_CARE_PROVIDER_SITE_OTHER): Payer: Federal, State, Local not specified - PPO | Admitting: Family Medicine

## 2011-05-22 VITALS — BP 120/70 | HR 61 | Temp 97.6°F | Ht 64.0 in | Wt 210.8 lb

## 2011-05-22 DIAGNOSIS — F411 Generalized anxiety disorder: Secondary | ICD-10-CM

## 2011-05-22 DIAGNOSIS — L508 Other urticaria: Secondary | ICD-10-CM | POA: Insufficient documentation

## 2011-05-22 DIAGNOSIS — G2581 Restless legs syndrome: Secondary | ICD-10-CM

## 2011-05-22 DIAGNOSIS — F458 Other somatoform disorders: Secondary | ICD-10-CM

## 2011-05-22 DIAGNOSIS — M79609 Pain in unspecified limb: Secondary | ICD-10-CM

## 2011-05-22 DIAGNOSIS — G894 Chronic pain syndrome: Secondary | ICD-10-CM

## 2011-05-22 DIAGNOSIS — M1610 Unilateral primary osteoarthritis, unspecified hip: Secondary | ICD-10-CM

## 2011-05-22 DIAGNOSIS — K219 Gastro-esophageal reflux disease without esophagitis: Secondary | ICD-10-CM

## 2011-05-22 DIAGNOSIS — M169 Osteoarthritis of hip, unspecified: Secondary | ICD-10-CM

## 2011-05-22 DIAGNOSIS — M79671 Pain in right foot: Secondary | ICD-10-CM

## 2011-05-22 DIAGNOSIS — L509 Urticaria, unspecified: Secondary | ICD-10-CM

## 2011-05-22 HISTORY — DX: Other urticaria: L50.8

## 2011-05-22 MED ORDER — HYDROCODONE-ACETAMINOPHEN 5-500 MG PO TABS: 1.0000 | ORAL_TABLET | Freq: Four times a day (QID) | ORAL | Status: DC | PRN

## 2011-05-22 MED ORDER — FEXOFENADINE HCL 180 MG PO TABS: 180.0000 mg | ORAL_TABLET | Freq: Every day | ORAL | Status: DC

## 2011-05-22 MED ORDER — OMEPRAZOLE 40 MG PO CPDR: 40.0000 mg | DELAYED_RELEASE_CAPSULE | Freq: Every day | ORAL | Status: DC

## 2011-05-22 MED ORDER — HYDROXYZINE HCL 50 MG PO TABS: ORAL_TABLET | ORAL | Status: DC

## 2011-05-22 MED ORDER — NITROGLYCERIN 0.4 MG SL SUBL: 0.4000 mg | SUBLINGUAL_TABLET | SUBLINGUAL | Status: DC | PRN

## 2011-05-22 NOTE — Patient Instructions (Signed)
May take two hydrocodone tablets a day for hip pain at work, and may take a tablet at night as needed for restless leg syndrome flares.  May return to work for 4 to 5 hours a day.  Use surgical boot when swelling in foot occurs.

## 2011-05-23 ENCOUNTER — Telehealth: Payer: Self-pay | Admitting: Family Medicine

## 2011-05-23 ENCOUNTER — Encounter: Payer: Self-pay | Admitting: Family Medicine

## 2011-05-23 ENCOUNTER — Other Ambulatory Visit: Payer: Self-pay | Admitting: Family Medicine

## 2011-05-23 NOTE — Telephone Encounter (Signed)
[facility] needs the instructions for the medication prescribed.  Please call [phone] Reference # [id number] to give the instructions for the medication.  They got the Rx but no instructions.

## 2011-05-23 NOTE — Telephone Encounter (Signed)
Called and gave Sig to [facility].

## 2011-05-23 NOTE — Progress Notes (Signed)
  Subjective:    Patient ID: [name], female    DOB: [birth year], 60 y.o.   MRN: [id number]  HPI Right foot pain/swelling Much improved.  Not worn post-op shoe for over a week. Has not had to take extra pain pill for foot.   Urticaria persists, intermittent, not as prominent as it was 2 weeks ago.   Chronic Pain syndrome Pain 4 out of 10 currently.  50% relief of pain since last office visit. Pain interferes with general activity 5 out of 10 (higher number indicates more interference). Pain interferes with mood 3 out of 10 Pain interferes with ability to work (in or out of home) 3 out of 10. Pain interferes with ability to interact with others 2 out of 10.  Pain interferes with ability to sleep 0 out of 10. Pain interferes with enjoyment of life 3 out of 10. Adverse effects of opiates of consitpation  Denies alcohol or illicit substance use  Functional GI disorder Good control with little burping or bloating sensation.  Good appettite. Normal BMs.   Medications, past medical history,  family history, social history were reviewed and updated.  Review of Systems See HPI     Objective:   Physical Exam  Constitutional: She appears well-developed and well-nourished. No distress.  Cardiovascular: Normal rate, regular rhythm and normal heart sounds.   Pulmonary/Chest: Effort normal and breath sounds normal.  Abdominal: Soft. Bowel sounds are normal.  Psychiatric: She has a normal mood and affect. Her behavior is normal. Thought content normal.   Right foot without leg swelling or eyrthema        Assessment & Plan:

## 2011-05-23 NOTE — Assessment & Plan Note (Signed)
Improved but not resolved from last OV two weeks ago.  Has not used hydroxyzine because she did not have enough to take at bedtime for sleep and take extra during day for hives.   Plan: increase dispensed amount of hydroxyzine. Restart Allegra daily.  Could add H-2 blocker if persists.  No diagnostic work-up undertaken as of yet. Waiting to see if it will self-resolve.

## 2011-05-23 NOTE — Assessment & Plan Note (Signed)
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  Her hydrocodone/APAP talbets dispense was inclreased to allow her to take two tablets a day while at work in order to remain occupationally functional.  She will continue to take the hydorcone/apap at bedtime prn for control of her restless leg sysndrome symptoms.

## 2011-05-23 NOTE — Progress Notes (Signed)
This encounter was created in error - please disregard.  This encounter was created in error - please disregard.

## 2011-05-23 NOTE — Assessment & Plan Note (Signed)
Resolved.  May be secondary to OA in first MTP seen on foot Xray.  Will treat symptomatically with acetaminophen and rigid post-op shoe when flares up.

## 2011-06-03 ENCOUNTER — Encounter: Payer: Self-pay | Admitting: Family Medicine

## 2011-06-19 ENCOUNTER — Ambulatory Visit (INDEPENDENT_AMBULATORY_CARE_PROVIDER_SITE_OTHER): Payer: Federal, State, Local not specified - PPO | Admitting: Family Medicine

## 2011-06-19 ENCOUNTER — Encounter: Payer: Self-pay | Admitting: Family Medicine

## 2011-06-19 VITALS — BP 111/74 | HR 69 | Temp 98.1°F | Wt 214.0 lb

## 2011-06-19 DIAGNOSIS — M169 Osteoarthritis of hip, unspecified: Secondary | ICD-10-CM

## 2011-06-19 DIAGNOSIS — G894 Chronic pain syndrome: Secondary | ICD-10-CM

## 2011-06-19 DIAGNOSIS — M1610 Unilateral primary osteoarthritis, unspecified hip: Secondary | ICD-10-CM

## 2011-06-19 DIAGNOSIS — G2581 Restless legs syndrome: Secondary | ICD-10-CM

## 2011-06-19 MED ORDER — MORPHINE SULFATE CR 15 MG PO TB12: ORAL_TABLET | ORAL | Status: DC

## 2011-06-19 MED ORDER — HYDROCODONE-ACETAMINOPHEN 10-500 MG PO TABS: 1.0000 | ORAL_TABLET | Freq: Four times a day (QID) | ORAL | Status: DC | PRN

## 2011-06-19 NOTE — Patient Instructions (Addendum)
Increase your Hydroxyzine to 4 or 5 tablets at bedtime to help with Sleep.  Change your Hydrocodone/acetaminophen 10 mg/ 325 mg from 5 mg /500mg .   Take one at bedtime as needed for restless leg syndrome. Take one to two tablets at work for hip pain, as needed.  Decrease your omeprazole to one capsule a day.  Increase to two capsules a day when your stomach is upset.

## 2011-06-20 ENCOUNTER — Encounter: Payer: Self-pay | Admitting: Family Medicine

## 2011-06-20 NOTE — Assessment & Plan Note (Signed)
Patient given Rx for MS Contin for one month.  Will see patient back in one month to assess analgesic control and tolerance.  If adequate control, will give three Rx for MS Contin fior 3 months supply.

## 2011-06-20 NOTE — Assessment & Plan Note (Signed)
Will change Vicodin to 10/500 mg tablet for her to take half to one whole tablet with onset of RLS symptoms that impair falling asleep.  She is needing about 20 mg of Vicodin each week for her RLS sympotms in order to get adequate sleep.

## 2011-06-20 NOTE — Progress Notes (Signed)
  Subjective:    Patient ID: [name], female    DOB: 1951-08-24, 60 y.o.   MRN: [id number]  HPI Pt provide in her symptom diary which we reviewed together.  She uses highlighters to note recurrent symptoms and pains.   Chronic Pain Syndrome Pain in left hip worsened since returning to work. Working 5 hour shifts as Conservation officer, nature at [employer].  Taking two vidocin at start of shift, usually has to take one more vicodin by her fourth hour of work in order to complete her 5 hour shift.   Restless Leg syndrome  Needing to take Vicodin for RLS about 4 days out of every week.  Often must take an additional Vicodin tablets to get RLS feeling to resolve enough for her to fall asleep.   Able to control constipation with Miralax.  Denies alcohol or illicit substance use.  No smoking.  Functional GI disorder  Good control with little burping or bloating sensation. Good appettite. Normal BMs.       Review of Systems     Objective:   Physical Exam        Assessment & Plan:

## 2011-06-20 NOTE — Assessment & Plan Note (Signed)
Patient requiring between 10 to 15 mg of Vicodin with each  work shift in order to complete the 5 hour shifts.   Will increase her Vicodin to 10/500 mg tablet, she may use one tablet at start of shift and then half to whole tablet towards the end of her shift.  She believes that the Vicodin is allowing her to remain functional to the degree necessary to remain employed as a Conservation officer, nature.   Patient may have 40 tablets of Vicodin 10/500 a month to use for breakthru pain and her RLS symptoms.

## 2011-07-17 ENCOUNTER — Ambulatory Visit (INDEPENDENT_AMBULATORY_CARE_PROVIDER_SITE_OTHER): Payer: Federal, State, Local not specified - PPO | Admitting: Family Medicine

## 2011-07-17 ENCOUNTER — Encounter: Payer: Self-pay | Admitting: Family Medicine

## 2011-07-17 DIAGNOSIS — M1610 Unilateral primary osteoarthritis, unspecified hip: Secondary | ICD-10-CM

## 2011-07-17 DIAGNOSIS — M169 Osteoarthritis of hip, unspecified: Secondary | ICD-10-CM

## 2011-07-17 DIAGNOSIS — G894 Chronic pain syndrome: Secondary | ICD-10-CM

## 2011-07-17 DIAGNOSIS — G2581 Restless legs syndrome: Secondary | ICD-10-CM

## 2011-07-17 MED ORDER — MORPHINE SULFATE CR 15 MG PO TB12: ORAL_TABLET | ORAL | Status: DC

## 2011-07-17 MED ORDER — MORPHINE SULFATE CR 15 MG PO TB12: 15.0000 mg | ORAL_TABLET | Freq: Two times a day (BID) | ORAL | Status: DC

## 2011-07-17 MED ORDER — HYDROCODONE-ACETAMINOPHEN 10-500 MG PO TABS: 1.0000 | ORAL_TABLET | Freq: Four times a day (QID) | ORAL | Status: DC | PRN

## 2011-07-17 NOTE — Assessment & Plan Note (Signed)
Improved control with using Vicodin 10/500 once a night about twice a week. Tolerating the vicodin without significant problems except constipation which she mangaes with stool softeners.

## 2011-07-17 NOTE — Assessment & Plan Note (Signed)
Patient requiring 20 mg of hydrocodone with each work shift in order to complete her 5-hour shifts.  For tolerance of hydrocodone/apap 10/500 mg tablets, see RLS px.  Patient may have 60 tablets of hydrocodone 10/500 a month for her hip osteoarthritis pain and her RLS symptoms.

## 2011-07-17 NOTE — Progress Notes (Signed)
  Subjective:    Patient ID: [name], female    DOB: 1951-02-17, 60 y.o.   MRN: [id number]  HPI   Chronic Pain Syndrome  Pain in left hip worsened since returning to work. Working 5 hour shifts as Conservation officer, nature at [employer]. Taking one  vidocin 10/500 at start of shift, usually has to take one more vicodin 10/500 by her fourth hour of work in order to complete her 5 hour shift.  Restless Leg syndrome  Needing to take Vicodin 10/500 for RLS about 2 days out of every week.  Able to control constipation with Miralax.  Denies alcohol or illicit substance use. No smoking.  Functional GI disorder  Good control with little burping or bloating sensation. Good appettite. Normal BMs.   Aching in knees at night Bilateral aching in knees upon awakening in morning.  L>R.   Medications, past medical history,  family history, social history were reviewed and updated.   Review of Systems See HPI.     Objective:   Physical Exam  Constitutional: No distress.       obese  HENT:  Head: Normocephalic and atraumatic.  Cardiovascular: Normal rate, regular rhythm, normal heart sounds and intact distal pulses.   No murmur heard. Pulmonary/Chest: Effort normal and breath sounds normal.  Abdominal: Soft. Normal appearance and bowel sounds are normal. There is no tenderness.  Musculoskeletal: She exhibits no edema.  Psychiatric: She has a normal mood and affect. Her behavior is normal. Thought content normal. Her mood appears not anxious. Her affect is not labile. Her speech is not rapid and/or pressured. Cognition and memory are normal. She does not exhibit a depressed mood.          Assessment & Plan:

## 2011-07-17 NOTE — Patient Instructions (Signed)
Work on stretching the muscles of the back of your thigh (hamstrings) and calves.  They are very tight.  You may take one vicodin twice at work.  You may take one Vicodin at bedtime for restless leg symptoms.

## 2011-07-17 NOTE — Assessment & Plan Note (Signed)
Patient with adequate pain control with basal MS Contin 15 mg bid with hydrocodone/APAP 10/500  Mg tablet prn breakthru pain.  Tolerating medications, managing constipation well, no aberrant behaviors displayed.  Plan to continue MS Contin 15 mg BID and breakthru hydrocodone/APAP 10/500 tablets.  Printed scripts for 60 tablets MS Contin 15 mg # 60 to fill 07/26/11, 08/25/11 and 09/24/11.  Printed hydrocodone/APAP 10/500 mg #60 per month with 2 refills.

## 2011-08-03 LAB — CBC
MCHC: 34.2
RBC: 4.13
WBC: 8.2

## 2011-08-03 LAB — COMPREHENSIVE METABOLIC PANEL
ALT: 16
AST: 19
CO2: 26
Calcium: 9.3
Chloride: 105
GFR calc Af Amer: >60
GFR calc non Af Amer: >60
Sodium: 140

## 2011-08-03 LAB — DRUGS OF ABUSE SCREEN W/O ALC, ROUTINE URINE
Creatinine,U: 22.4
Marijuana Metabolite: POSITIVE — AB
Opiate Screen, Urine: NEGATIVE
Propoxyphene: NEGATIVE

## 2011-08-03 LAB — URINALYSIS, ROUTINE W REFLEX MICROSCOPIC
Nitrite: NEGATIVE
Specific Gravity, Urine: 1.006
Urobilinogen, UA: 0.2

## 2011-08-11 LAB — POCT I-STAT, CHEM 8
Calcium, Ion: 1.14 mmol/L (ref 1.12–1.32)
Chloride: 102 mEq/L (ref 96–112)
Creatinine, Ser: 0.8 mg/dL (ref 0.4–1.2)
Glucose, Bld: 118 mg/dL — ABNORMAL HIGH (ref 70–99)
HCT: 40 % (ref 36.0–46.0)

## 2011-09-11 ENCOUNTER — Other Ambulatory Visit: Payer: Self-pay | Admitting: Family Medicine

## 2011-09-11 NOTE — Telephone Encounter (Signed)
Refill request

## 2011-09-14 ENCOUNTER — Telehealth: Payer: Self-pay | Admitting: Family Medicine

## 2011-09-14 NOTE — Telephone Encounter (Signed)
Ms [name] should not be treated for strep infection unless she develops symptoms.  These symptoms include, sore throat, fever, headache.  If these symptoms develop, she should she the doctor.

## 2011-09-14 NOTE — Telephone Encounter (Signed)
Left message on voicemail from MD.

## 2011-09-14 NOTE — Telephone Encounter (Signed)
Ms. [name] is calling because she just found out her grandson has strep throat and she is concerned because she shared grits with him.  She is not feeling sick, but she wants to know if she can be treated for it even if she isn't showing any symptoms.

## 2011-10-12 ENCOUNTER — Encounter: Payer: Self-pay | Admitting: Family Medicine

## 2011-10-12 ENCOUNTER — Ambulatory Visit: Payer: Federal, State, Local not specified - PPO | Admitting: Family Medicine

## 2011-10-12 ENCOUNTER — Ambulatory Visit (INDEPENDENT_AMBULATORY_CARE_PROVIDER_SITE_OTHER): Payer: Federal, State, Local not specified - PPO | Admitting: Family Medicine

## 2011-10-12 VITALS — BP 113/71 | HR 68 | Temp 98.0°F | Ht 64.0 in | Wt 226.0 lb

## 2011-10-12 DIAGNOSIS — J209 Acute bronchitis, unspecified: Secondary | ICD-10-CM

## 2011-10-12 NOTE — Patient Instructions (Addendum)
You have an Acute Broanchitis. Bedrest for today and tomorrow.  Take Tylenol (acetaminophen) 650 mg every 6 hours for next two days to help with body aches. Drink lots of fluids.

## 2011-10-12 NOTE — Progress Notes (Signed)
  Subjective:    Patient ID: [name], female    DOB: November 05, 1951, 60 y.o.   MRN: [id number]  HPI     Review of Systems     Objective:   Physical Exam        Assessment & Plan:   Subjective:     [name] is a 60 y.o. female here for evaluation of a cough. Onset of symptoms was 3 day ago. Symptoms have been gradually improving since that time. The cough is productive of yellow sputum and is aggravated by  deep breath or laughing. Associated symptoms include: fever, sputum production and myalgias. Patient does not have a history of asthma. Patient does not have a history of environmental allergens. Patient has not traveled recently. Patient does have a history of smoking. Patient has not had a previous chest x-ray.  The following portions of the patient's history were reviewed and updated as appropriate: allergies, past family history, past social history, past surgical history and problem list.  Review of Systems Constitutional: negative for chills Ears, nose, mouth, throat, and face: positive for nasal congestion and sore throat, negative for earaches and hoarseness Respiratory: positive for cough and sputum, negative for dyspnea on exertion, hemoptysis, pleurisy/chest pain and wheezing    Objective:    x General appearance: alert and cooperative Head: Normocephalic, without obvious abnormality, atraumatic Eyes: conjunctivae/corneas clear. PERRL, EOM's intact. Fundi benign. Ears: normal TM's and external ear canals both ears Throat: normal findings: oropharynx pink & moist without lesions or evidence of thrush Neck: no adenopathy, no carotid bruit, no JVD, supple, symmetrical, trachea midline and thyroid not enlarged, symmetric, no tenderness/mass/nodules Lungs: clear to auscultation bilaterally Heart: regular rate and rhythm, S1, S2 normal, no murmur, click, rub or gallop Lymph nodes: Cervical, supraclavicular normal.    Assessment:    Acute Bronchitis      Plan:    Explained lack of efficacy of antibiotics in viral disease. Antitussives per medication orders. Avoid exposure to tobacco smoke and fumes. B-agonist inhaler. Call if shortness of breath worsens, blood in sputum, change in character of cough, development of fever or chills, inability to maintain nutrition and hydration. Avoid exposure to tobacco smoke and fumes.  APAP 650 mg four times a day for next two days. Out of work note to cover yesterday, today and tomorrow.  May return to work on 10/14/11.

## 2011-10-23 ENCOUNTER — Ambulatory Visit (INDEPENDENT_AMBULATORY_CARE_PROVIDER_SITE_OTHER): Payer: Federal, State, Local not specified - PPO | Admitting: Family Medicine

## 2011-10-23 ENCOUNTER — Encounter: Payer: Self-pay | Admitting: Family Medicine

## 2011-10-23 DIAGNOSIS — L719 Rosacea, unspecified: Secondary | ICD-10-CM

## 2011-10-23 DIAGNOSIS — G894 Chronic pain syndrome: Secondary | ICD-10-CM

## 2011-10-23 DIAGNOSIS — R911 Solitary pulmonary nodule: Secondary | ICD-10-CM

## 2011-10-23 DIAGNOSIS — D3500 Benign neoplasm of unspecified adrenal gland: Secondary | ICD-10-CM

## 2011-10-23 DIAGNOSIS — E278 Other specified disorders of adrenal gland: Secondary | ICD-10-CM

## 2011-10-23 MED ORDER — MORPHINE SULFATE CR 15 MG PO TB12: 15.0000 mg | ORAL_TABLET | Freq: Two times a day (BID) | ORAL | Status: DC

## 2011-10-23 MED ORDER — MORPHINE SULFATE CR 15 MG PO TB12: ORAL_TABLET | ORAL | Status: DC

## 2011-10-23 MED ORDER — HYDROCODONE-ACETAMINOPHEN 10-500 MG PO TABS: 1.0000 | ORAL_TABLET | Freq: Four times a day (QID) | ORAL | Status: DC | PRN

## 2011-10-23 NOTE — Patient Instructions (Signed)
Pick up Neosynephrine nasal drops, use as directed for up to 4 days to help decongest nose and breath easier.   Use nasal wings to keep nose airway passages open, especially at night.  Use hydroxyzine at bedtime and one tablet twice a day during the day to help with nasal drainage and congestion.   Use Clearasil (benzoyl peroxide) on red skin of face to treat acne rosacea.

## 2011-10-24 ENCOUNTER — Encounter: Payer: Self-pay | Admitting: Family Medicine

## 2011-10-24 DIAGNOSIS — L719 Rosacea, unspecified: Secondary | ICD-10-CM

## 2011-10-24 HISTORY — DX: Rosacea, unspecified: L71.9

## 2011-10-24 NOTE — Assessment & Plan Note (Signed)
Not clinically symptomatic.  Patient wants to wait until next OV in February to arrange follow up abd CT of lesion.

## 2011-10-24 NOTE — Assessment & Plan Note (Signed)
[name] with adequate pain contol using Gabriella Contin 15 mg bid basal with hydrocodone/APA 10/500 tablets prn breakthruough pain which she uses primarily when working at [employer] or for her RLS at night.  No aberrant behaviors displayed.  Tolerating opiates.  Self-managing constipation with dietary fiber and Miralax as needed.   Printed scripts to Gabriella Contin 15 mg #60 fill 10/23/11, 11/22/11, and 12/22/11. Gave to pt. Printed script for Vicodin 10/500 #60 with 2 refills ( to last until March 2013). Gave to pt.

## 2011-10-24 NOTE — Assessment & Plan Note (Signed)
Topical benzoyl peroxide (OTC) as directed.

## 2011-10-24 NOTE — Progress Notes (Signed)
  Subjective:    Patient ID: [name], female    DOB: [birth year], 60 y.o.   MRN: [id number]  HPI Chronic Pain Follow-Up Assessment Chronic Pain Diagnosis: Osteoarthritis of spine, hips, knees and hands. Fibromyalgia. Pain scale rating at its BEST in the past 24 hours (1 to 10): 4 Pain scale rating at its WORST in the past 24 hours (1 to 10): 8 Adverse Effects:  (None__ Nausea__ Vomiting__ Confusion__ Sleepiness__ Fatigue__ Constipation_X_ Other__) Treatment of Adverse Effects: increase fiber in foods and uses Miralax Since the last clinic visit, how much relief have pain treatment and medication provided? Please circle the one percentage that shows how much relief you have received: 0%__ 10%__ 20%__ 30%__ 40%__ 50%__ 60%__ 70%_X  80%__ 90%__ 100%__ [name] the one number that describes how, during the past 24 hours, pain has interfered with your: General Activity 0__ 1__ 2__ 3__ 4__ 5__ 6__ 7_X_ 8__ 9__ 10__ Does not interfere      Completely interferes Mood 0__ 1__ 2__ 3__ 4__ 5_X_ 6__ 7__ 8__ 9__ 10__ Does not interfere      Completely interferes Ability to work (in or out of the home) 0__ 1__ 2__ 3__ 4_X_ 5__ 6__ 7__ 8__ 9__ 10__ Does not interfere      Completely interferes Interactions with other people 0__ 1__ 2__ 3__ 4__ 5__ 6__ 7__ 8__ 9__ 10__ Does not interfere      Completely interferes Sleep 0__ 1__ 2__ 3_X_ 4__ 5__ 6_X_ 7__ 8__ 9__ 10__ Does not interfere      Completely interferes  Enjoyment of life 0__ 1__ 2__ 3__ 4__ 5__ 6__ 7_X_ 8__ 9__ 10__ Does not interfere      Completely interferes   "I do not know how much meds interfere or if it is more depression and thyroid that cause  these interferences."  Taking MS Contin 15 mg twice a day, takes one Vicodin for RLS about twice a week. 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   Review of Systems No bleeding./ No numbness, no worsening in belching, No chest pain or claudication.  Has refrigerator in her room in which she keeps snacks, that she believes may be contributing to her increase in weight recently.   Meds reviewed and updated Patient is not smoking.      Objective:   Physical Exam  Constitutional: No distress.       Obese, trunkal  Cardiovascular: Normal rate and regular rhythm.  Exam reveals no gallop and no friction rub.   No murmur heard. Pulmonary/Chest: Effort normal and breath sounds normal.  Musculoskeletal:       Pain with internal rotation right hip.  Crepitus bilateral knee. No effusions of knees or erythema. Mild paraspinal muscle tenderness to palpation in thoracolumbar bilateral.           Assessment & Plan:

## 2011-10-24 NOTE — Assessment & Plan Note (Signed)
Patient wants to wait until February to perform follow up chest CT of 4 mm nodule from 04/2011.  This does not seem unreasonable.  Will schedule imaging on OV in February.

## 2011-11-22 ENCOUNTER — Other Ambulatory Visit: Payer: Self-pay | Admitting: Family Medicine

## 2011-11-22 DIAGNOSIS — Z1231 Encounter for screening mammogram for malignant neoplasm of breast: Secondary | ICD-10-CM

## 2011-12-13 ENCOUNTER — Other Ambulatory Visit: Payer: Self-pay | Admitting: Family Medicine

## 2011-12-13 MED ORDER — HYDROCHLOROTHIAZIDE 25 MG PO TABS: 25.0000 mg | ORAL_TABLET | Freq: Every day | ORAL | Status: DC

## 2011-12-13 MED ORDER — RAMIPRIL 10 MG PO CAPS: 10.0000 mg | ORAL_CAPSULE | Freq: Every day | ORAL | Status: DC

## 2011-12-20 ENCOUNTER — Ambulatory Visit
Admission: RE | Admit: 2011-12-20 | Discharge: 2011-12-20 | Disposition: A | Discharge status: Home / Self Care | Payer: Federal, State, Local not specified - PPO | Source: Ambulatory Visit | Attending: Family Medicine | Admitting: Family Medicine

## 2011-12-20 DIAGNOSIS — Z1231 Encounter for screening mammogram for malignant neoplasm of breast: Secondary | ICD-10-CM

## 2012-01-03 ENCOUNTER — Telehealth: Payer: Self-pay | Admitting: *Deleted

## 2012-01-03 NOTE — Telephone Encounter (Signed)
Patient calling with c/o feeling "shaky on inside."  States she has "borderline diabetes."  Does not have monitor and is not on any diabetes meds.  Patient has hx of depression and has had anxiety in past.  Took a lorazepam 30 min ago and is starting to feel better.  Has an appt with Dr. Perley Jain on Monday and will call back as needed.   Gaylene Brooks, RN

## 2012-01-08 ENCOUNTER — Ambulatory Visit (INDEPENDENT_AMBULATORY_CARE_PROVIDER_SITE_OTHER): Payer: Federal, State, Local not specified - PPO | Admitting: Family Medicine

## 2012-01-08 ENCOUNTER — Encounter: Payer: Self-pay | Admitting: Family Medicine

## 2012-01-08 DIAGNOSIS — G894 Chronic pain syndrome: Secondary | ICD-10-CM

## 2012-01-08 DIAGNOSIS — E278 Other specified disorders of adrenal gland: Secondary | ICD-10-CM

## 2012-01-08 DIAGNOSIS — D3500 Benign neoplasm of unspecified adrenal gland: Secondary | ICD-10-CM

## 2012-01-08 DIAGNOSIS — R911 Solitary pulmonary nodule: Secondary | ICD-10-CM

## 2012-01-08 DIAGNOSIS — M479 Spondylosis, unspecified: Secondary | ICD-10-CM

## 2012-01-08 DIAGNOSIS — M169 Osteoarthritis of hip, unspecified: Secondary | ICD-10-CM

## 2012-01-08 DIAGNOSIS — R7309 Other abnormal glucose: Secondary | ICD-10-CM

## 2012-01-08 DIAGNOSIS — I10 Essential (primary) hypertension: Secondary | ICD-10-CM

## 2012-01-08 DIAGNOSIS — N898 Other specified noninflammatory disorders of vagina: Secondary | ICD-10-CM

## 2012-01-08 DIAGNOSIS — E669 Obesity, unspecified: Secondary | ICD-10-CM

## 2012-01-08 DIAGNOSIS — G2581 Restless legs syndrome: Secondary | ICD-10-CM

## 2012-01-08 DIAGNOSIS — M1610 Unilateral primary osteoarthritis, unspecified hip: Secondary | ICD-10-CM

## 2012-01-08 DIAGNOSIS — R7303 Prediabetes: Secondary | ICD-10-CM

## 2012-01-08 LAB — POCT WET PREP (WET MOUNT)

## 2012-01-08 LAB — POCT GLYCOSYLATED HEMOGLOBIN (HGB A1C): Hemoglobin A1C: 5.9

## 2012-01-08 MED ORDER — MORPHINE SULFATE CR 15 MG PO TB12: 15.0000 mg | ORAL_TABLET | Freq: Two times a day (BID) | ORAL | Status: DC

## 2012-01-08 MED ORDER — HYDROCODONE-ACETAMINOPHEN 10-500 MG PO TABS: 1.0000 | ORAL_TABLET | Freq: Four times a day (QID) | ORAL | Status: DC | PRN

## 2012-01-08 MED ORDER — POTASSIUM CHLORIDE CRYS ER 20 MEQ PO TBCR: 20.0000 meq | EXTENDED_RELEASE_TABLET | Freq: Every day | ORAL | Status: DC

## 2012-01-08 NOTE — Patient Instructions (Signed)
Dr Ramell Wacha will arrange your CT scans for about two weeks from know.

## 2012-01-09 ENCOUNTER — Other Ambulatory Visit: Payer: Self-pay | Admitting: Family Medicine

## 2012-01-09 ENCOUNTER — Encounter: Payer: Self-pay | Admitting: Family Medicine

## 2012-01-09 ENCOUNTER — Other Ambulatory Visit: Payer: Self-pay | Admitting: *Deleted

## 2012-01-09 DIAGNOSIS — E278 Other specified disorders of adrenal gland: Secondary | ICD-10-CM

## 2012-01-09 MED ORDER — POTASSIUM CHLORIDE CRYS ER 20 MEQ PO TBCR: 20.0000 meq | EXTENDED_RELEASE_TABLET | Freq: Two times a day (BID) | ORAL | Status: DC

## 2012-01-09 NOTE — Progress Notes (Signed)
  Subjective:    Patient ID: [name], female    DOB: [birth year], 61 y.o.   MRN: [id number]  HPI Chronic Pain Follow-Up Assessment Chronic Pain Diagnosis: Bilateral Hip Osteoarthritis. Lumbar OA with Spinal Stenosis, Restless Leg Syndrome Pain scale rating at its BEST in the past 24 hours (1 to 10): 3 Pain scale rating at its WORST in the past 24 hours (1 to 10): 6 Adverse Effects:  (None__ Nausea__ Vomiting__ Confusion_x_ Sleepiness_x_ Fatigue_x_ Constipation__ Other__)- Patient believesw fatigue is from exacerbation of her depression and anxiety Treatment of Adverse Effects: Miralax Since the last clinic visit, how much relief have pain treatment and medication provided? Please circle the one percentage that shows how much relief you have received: 0%__ 10%__ 20%__ 30%__ 40%__ 50%__ 60%__ 70%__ 80%_x_ 90%__ 100%__ [name] the one number that describes how, during the past 24 hours, pain has interfered with your: General Activity 0__ 1__ 2__ 3_x (at home not working) 4__ 5__ 6__ 7__ 8__ 9__ 10__ Does not interfere       Completely interferes Mood 0__ 1__ 2__ 3__ 4__ 5_x_ 6__ 7__ 8__ 9__ 10__ Does not interfere       Completely interferes Ability to work (in or out of the home) 0__ 1__ 2__ 3__ 4__ 5__ 6__ 7_x_ 8__ 9__ 10__ Does not interfere      Completely interferes  Interactions with other people 0__ 1__ 2__ 3_x_ 4__ 5__ 6__ 7__ 8__ 9__ 10__ Does not interfere      Completely interferes  Sleep 0__ 1__ 2__ 3__ 4__ 5_x_ 6__ 7__ 8__ 9__ 10__ Does not interfere      Completely interferes  Enjoyment of life 0__ 1__ 2__ 3__ 4__ 5_x_ 6__ 7__ 8__ 9__ 10__ Does not interfere      Completely interferes   CHRONIC HYPERTENSION  Disease Monitoring  Blood pressure range: no measuring at home  Chest pain: no   Dyspnea: no   Claudication: no   Medication compliance: yes  Medication Side Effects  Lightheadedness: no   Urinary frequency: no   Edema: no    Preventitive  Healthcare:  Exercise: no   Diet Pattern: snacking alot  Salt Restriction: not avoiding salty foods  SH; not smoking. Significant interrelationship stress with her son with whom she lives. Tearful during interview when discussing her relationship with him. 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  Patient is seen by counselor weekly. She continues to see [name]). No thoughts of self-harm or harming others.   Review of Systems see history of present illness     Objective:   Physical Exam  Vital signs noted. Blood pressure progressively decreasing as patient relaxed during interview. Gen.: Well groomed. No acute distress. Consolable when tearful. Lungs: BCTA, no accessory muscle use. Cardiac: Heart regular rate and rhythm. No murmur, gallop, or rub. No JVD. Extremities: Trace ankle edema bilaterally Musculoskeletal: Tender to palpation right sciatic notch area pain to left hip, internal rotation. Tenderness to paraspinal palpation lumbar region bilaterally. Knees without swelling or tenderness. Full range of motion at the knees. Gait non-atalgic.          Assessment & Plan:

## 2012-01-09 NOTE — Assessment & Plan Note (Signed)
Patient agrees to Chest CT to look for changes in the 4 mm RLL nodule. Patient would like a two-week notice before CT so she can schedule time off from work.  Will schedule dedicated Chest  CT with IV contrast to look for evidence of chest lesion changes indicative of malignancy.

## 2012-01-09 NOTE — Assessment & Plan Note (Addendum)
Patient agrees to repeat CT of abdomen to look for changes in the left adrenal nodule. Patient would like a two-week notice before CT so she can schedule time off from work.  Will schedule dedicated pre and postcontrast CT to look for evidence of adrenal malignancy.

## 2012-01-09 NOTE — Assessment & Plan Note (Signed)
In office, blood pressure measurements, elevated, but progressively declined as patient calm down during interview. Suspect elevation of blood pressure today is primarily sympathetically driven because of strong emotional state. Patient tolerating her home blood pressure medications. No evidence of acute end organ damage. Plan to continue current antihypertensive medication regiment. Recheck blood pressure on next office visit.

## 2012-01-09 NOTE — Assessment & Plan Note (Addendum)
Adequate pain control using MS Contin 15 mg twice daily along w;ith Vicodin 10/500  One tablet about twice a day for breakthrough pain and RLS exacerbations.  She uses the Vicodin primarily at work for pain breakthrough. No aberrant drug-seeking behaviors displayed since last office visit.  No unmanageable adverse effects from opiates.  3 Printed Scripts for [employer] Contin 15mg  #60 tab. To be filled 01/19/12, 02/19/12, and 03/20/12. Scripts handed to patient 1 Printed script for Vicodin 10/500 mg #60 tab with 2 Refills. Initial fill on 01/19/12.  To last until 04/20/12.  Script handed to patient.

## 2012-01-09 NOTE — Progress Notes (Signed)
Addended by: Acquanetta Belling D on: 01/09/2012 03:43 PM   Modules accepted: Orders

## 2012-01-09 NOTE — Progress Notes (Signed)
Addended by: Jone Baseman D on: 01/09/2012 03:47 PM   Modules accepted: Orders

## 2012-01-16 ENCOUNTER — Other Ambulatory Visit: Payer: Federal, State, Local not specified - PPO

## 2012-01-16 DIAGNOSIS — E278 Other specified disorders of adrenal gland: Secondary | ICD-10-CM

## 2012-01-16 DIAGNOSIS — E279 Disorder of adrenal gland, unspecified: Secondary | ICD-10-CM

## 2012-01-16 NOTE — Progress Notes (Signed)
Bmp done today [name] 

## 2012-01-17 LAB — BASIC METABOLIC PANEL
BUN: 19 mg/dL (ref 6–23)
CO2: 28 mEq/L (ref 19–32)
Glucose, Bld: 101 mg/dL — ABNORMAL HIGH (ref 70–99)
Potassium: 4.1 mEq/L (ref 3.5–5.3)

## 2012-01-19 ENCOUNTER — Telehealth: Payer: Self-pay | Admitting: Home Health Services

## 2012-01-19 NOTE — Telephone Encounter (Signed)
Azeneth returned phone call.  Pt expressed interest in health coaching.   Pt identified concern as weight gain.  Pt reported quitting smoking 4 yrs ago and gained 80 lbs.  Pt shared that she is currently in counseling.  I explained what health coaching is and what my role would be in helping her reach her health goals.  I explain that I am not a counselor but would help support her desired behavior changes.  Pt agree and we dicussed starting with weekly 10 min phone calls to establish goals and accountability. Pt is in the middle of a house move and we will schedule an office visit once things settle down.   Pt's homework this week is to take time to evaluate some of her behaviors and what types of goals she would like to start with.   Pt will call me next week with specific goals for health behavior changes.

## 2012-01-24 ENCOUNTER — Other Ambulatory Visit: Payer: Self-pay | Admitting: Family Medicine

## 2012-01-24 MED ORDER — POTASSIUM CHLORIDE CRYS ER 20 MEQ PO TBCR: 20.0000 meq | EXTENDED_RELEASE_TABLET | Freq: Two times a day (BID) | ORAL | Status: DC

## 2012-01-25 ENCOUNTER — Encounter (HOSPITAL_COMMUNITY): Payer: Self-pay

## 2012-01-25 ENCOUNTER — Other Ambulatory Visit (HOSPITAL_COMMUNITY): Payer: Federal, State, Local not specified - PPO

## 2012-01-25 ENCOUNTER — Ambulatory Visit (HOSPITAL_COMMUNITY)
Admission: RE | Admit: 2012-01-25 | Discharge: 2012-01-25 | Disposition: A | Discharge status: Home / Self Care | Payer: Federal, State, Local not specified - PPO | Source: Ambulatory Visit | Attending: Family Medicine | Admitting: Family Medicine

## 2012-01-25 DIAGNOSIS — E278 Other specified disorders of adrenal gland: Secondary | ICD-10-CM

## 2012-01-25 DIAGNOSIS — R911 Solitary pulmonary nodule: Secondary | ICD-10-CM

## 2012-01-25 DIAGNOSIS — K7689 Other specified diseases of liver: Secondary | ICD-10-CM | POA: Insufficient documentation

## 2012-01-25 DIAGNOSIS — D3500 Benign neoplasm of unspecified adrenal gland: Secondary | ICD-10-CM | POA: Insufficient documentation

## 2012-01-25 HISTORY — DX: Essential (primary) hypertension: I10

## 2012-01-25 MED ORDER — IOHEXOL 300 MG/ML  SOLN
125.0000 mL | Freq: Once | INTRAMUSCULAR | Status: AC | PRN
  Administered 2012-01-25: 125 mL via INTRAVENOUS

## 2012-02-06 ENCOUNTER — Other Ambulatory Visit: Payer: Self-pay | Admitting: Family Medicine

## 2012-02-06 DIAGNOSIS — F458 Other somatoform disorders: Secondary | ICD-10-CM

## 2012-02-06 DIAGNOSIS — K589 Irritable bowel syndrome without diarrhea: Secondary | ICD-10-CM

## 2012-02-06 MED ORDER — DICYCLOMINE HCL 20 MG PO TABS: 20.0000 mg | ORAL_TABLET | Freq: Four times a day (QID) | ORAL | Status: DC | PRN

## 2012-02-09 ENCOUNTER — Telehealth: Payer: Self-pay | Admitting: Family Medicine

## 2012-02-09 ENCOUNTER — Other Ambulatory Visit: Payer: Self-pay | Admitting: Family Medicine

## 2012-02-09 DIAGNOSIS — F411 Generalized anxiety disorder: Secondary | ICD-10-CM

## 2012-02-09 DIAGNOSIS — K589 Irritable bowel syndrome without diarrhea: Secondary | ICD-10-CM

## 2012-02-09 DIAGNOSIS — F458 Other somatoform disorders: Secondary | ICD-10-CM

## 2012-02-09 MED ORDER — HYDROXYZINE HCL 50 MG PO TABS: ORAL_TABLET | ORAL | Status: DC

## 2012-02-09 NOTE — Telephone Encounter (Signed)
Ref # [id number] - clarification on a medication is needed.

## 2012-02-09 NOTE — Telephone Encounter (Signed)
[facility] is calling because they got a refill request for hydroxyzine from [place] and pt recently got a RX for Vistaril from Dr Reddy's office. When I called pt she stated she has plenty of Vistaril left and refills so it is OK to cancel order for hydroxyzine from [place]. [facility] pharmacist notified.

## 2012-02-16 ENCOUNTER — Telehealth: Payer: Self-pay | Admitting: *Deleted

## 2012-02-16 NOTE — Telephone Encounter (Signed)
Message left on voicemail from [facility].  Needs to know if Rx is for Atarax or Vistaril.  Rx was sent to their pharmacy and the directions are different from what Dr. Betti Cruz had previously prescribed.  Patient has hx of having Vistaril 50mg .  Please ask for Ref. # [id number].   Will check with Dr. McDiarmid and call back.   Gaylene Brooks, RN

## 2012-02-19 ENCOUNTER — Other Ambulatory Visit: Payer: Self-pay | Admitting: Family Medicine

## 2012-02-19 MED ORDER — POTASSIUM CHLORIDE CRYS ER 20 MEQ PO TBCR: 20.0000 meq | EXTENDED_RELEASE_TABLET | Freq: Two times a day (BID) | ORAL | Status: DC

## 2012-02-29 ENCOUNTER — Ambulatory Visit (INDEPENDENT_AMBULATORY_CARE_PROVIDER_SITE_OTHER): Payer: Federal, State, Local not specified - PPO | Admitting: Family Medicine

## 2012-02-29 ENCOUNTER — Encounter: Payer: Self-pay | Admitting: Family Medicine

## 2012-02-29 VITALS — BP 131/82 | HR 64 | Temp 98.4°F | Ht 64.0 in | Wt 233.0 lb

## 2012-02-29 DIAGNOSIS — G2581 Restless legs syndrome: Secondary | ICD-10-CM

## 2012-02-29 DIAGNOSIS — E278 Other specified disorders of adrenal gland: Secondary | ICD-10-CM

## 2012-02-29 DIAGNOSIS — E8881 Metabolic syndrome: Secondary | ICD-10-CM | POA: Insufficient documentation

## 2012-02-29 DIAGNOSIS — I10 Essential (primary) hypertension: Secondary | ICD-10-CM

## 2012-02-29 DIAGNOSIS — R911 Solitary pulmonary nodule: Secondary | ICD-10-CM

## 2012-02-29 DIAGNOSIS — D3500 Benign neoplasm of unspecified adrenal gland: Secondary | ICD-10-CM

## 2012-02-29 DIAGNOSIS — G894 Chronic pain syndrome: Secondary | ICD-10-CM

## 2012-02-29 HISTORY — DX: Metabolic syndrome: E88.810

## 2012-02-29 MED ORDER — MORPHINE SULFATE CR 15 MG PO TB12: 15.0000 mg | ORAL_TABLET | Freq: Two times a day (BID) | ORAL | Status: DC

## 2012-02-29 MED ORDER — HYDROCODONE-ACETAMINOPHEN 10-500 MG PO TABS: 1.0000 | ORAL_TABLET | Freq: Four times a day (QID) | ORAL | Status: DC | PRN

## 2012-02-29 MED ORDER — POTASSIUM CHLORIDE ER 10 MEQ PO TBCR: 40.0000 meq | EXTENDED_RELEASE_TABLET | Freq: Two times a day (BID) | ORAL | Status: DC

## 2012-02-29 NOTE — Assessment & Plan Note (Signed)
Sources of chronic pain : Bilateral Hip Osteoarthritis. Lumbar OA with Spinal Stenosis, Restless Leg Syndrome. Left medial anterior knee pain that I suspect is osteoarthritis pain.  Adequate pain control with MS Contin 15 mg twice daily with hydrocodone/APAP 10/500, one to three tablets daily for breakthrough pain and RLS exacerbations.  No aberrant drug seeking behaviour found. Patient coping with opiate-related constipation using Miralax.  Four [facility] handwritten scripts for MS Contin 15 mg tab, #60. To be filled 04/20/12, 05/20/12, 06/20/12, 07/21/12. One hydrocodone/APAP 10/500 mg Rx #60 with Refill 4. To last until 08/20/12. Patient to see Dr Caswell Alvillar in 07/2012 or 08/2012 for chronic pain syndrome.

## 2012-02-29 NOTE — Progress Notes (Signed)
  Subjective:    Patient ID: [name], female    DOB: 1951-10-20, 61 y.o.   MRN: [id number]  HPI Chronic Pain Follow-Up Assessment  Chronic Pain Diagnosis: Bilateral Hip Osteoarthritis. Lumbar OA with Spinal Stenosis, Restless Leg Syndrome. Left anterior knee and left medial posterior thigh pain Pain scale rating at its BEST in the past 24 hours (1 to 10): 2 Pain scale rating at its WORST in the past 24 hours (1 to 10): 6  Adverse Effects: (+) Constipation.  No confusion. No clumsiness. No falls.   Treatment of Adverse Effects: Miralax   Taking two Lortab at work, primarily for left hip and left knee pain. She is working 3 to 4 days a week.  She takes one Lortab tablet once or twice a week for restless leg symptoms at bedtime.  She has been able to keep her use of Lortab and MS Contin to the prescribed 60 tablets of each a month.  CHRONIC HYPERTENSION  Disease Monitoring  Blood pressure range: no measuring at home  Chest pain: no  Dyspnea: no  Claudication: no  Medication compliance: yes  Medication Side Effects  Lightheadedness: no  Urinary frequency: no  Edema: no  Preventitive Healthcare:  Exercise: no  Diet Pattern: Started Atkins diet with low carb intake. Has lost 7 pounds on diet. Feeling better overall with weight loss.  Salt Restriction: not avoiding salty foods  SH; not smoking. Pt no longer living with her son's family.  She is living with her brother which she feels is significantly less emotionally stressful on her. Patient is seen by counselor weekly. She continues to see Dr. Betti Cruz psychiatry). No thoughts of self-harm or harming others.  Review of Systems see history of present illness   Review of Systems See HPI.     Objective:   Physical Exam  Vitals reviewed. Constitutional: No distress.       obese  Cardiovascular: Normal rate, regular rhythm and normal heart sounds.   No murmur heard. Pulmonary/Chest: Effort normal and breath sounds normal.    Musculoskeletal:       Left hip: She exhibits crepitus (left knee).       Back:       Legs:      Trace bilateral ankle edema.  Psychiatric: She has a normal mood and affect. Her behavior is normal. Judgment and thought content normal. Her speech is not delayed, not tangential and not slurred. Cognition and memory are normal. Cognition and memory are not impaired.       Well-groomed          Assessment & Plan:

## 2012-02-29 NOTE — Assessment & Plan Note (Signed)
Adequate blood pressure control.  No evidence of new end organ damage.  Tolerating medication without significant adverse effects.  Plan to continue current blood pressure regiment.   

## 2012-02-29 NOTE — Patient Instructions (Signed)
Your blood pressure looks good. Congratulations on your weight loss!  Schedule an appointment to see Dr Amelianna Meller in September.

## 2012-02-29 NOTE — Assessment & Plan Note (Signed)
Adequate control of symptoms with oxycodone 10/500 tablet at bedtime once or twice a week. Tolerating medication. Plan to continue current regiment.

## 2012-02-29 NOTE — Progress Notes (Signed)
Addended byPerley Jain, Arlo Butt D on: 02/29/2012 02:03 PM   Modules accepted: Orders

## 2012-04-16 ENCOUNTER — Ambulatory Visit (INDEPENDENT_AMBULATORY_CARE_PROVIDER_SITE_OTHER): Payer: Federal, State, Local not specified - PPO | Admitting: *Deleted

## 2012-04-16 ENCOUNTER — Telehealth: Payer: Self-pay | Admitting: Family Medicine

## 2012-04-16 VITALS — BP 102/64

## 2012-04-16 DIAGNOSIS — I10 Essential (primary) hypertension: Secondary | ICD-10-CM

## 2012-04-16 NOTE — Progress Notes (Signed)
BP check this morning at dentist---81/53.  BP today here in office---102/64 (manual in left arm).  Patient denies dizziness.  States she takes three BP meds.  Dr. McDiarmid not in office.  Patient had to leave due to having another appt.  Will route note to preceptor (Dr. Earnest Bailey) for advice and call patient back.   Gaylene Brooks, RN  Discussed with Dr. Earnest Bailey.  Will have patient hold Ramipril and come in for office visit.  Called and informed patient of med change.  Follow up appt scheduled with Dr. Hulen Luster for 04/24/12 @ 2:30pm.   Gaylene Brooks, RN

## 2012-04-16 NOTE — Telephone Encounter (Signed)
Patient had a BP of 81/53 this morning.  She wants to speak to the nurse about this.

## 2012-04-16 NOTE — Telephone Encounter (Addendum)
Returned call to patient.  Had dental procedure this morning and BP was low pre-procedure.   Patient states she has been having some balance issues when asked if she had been having dizziness.  Appt scheduled for today for BP check with nurse.  Gaylene Brooks, RN

## 2012-04-24 ENCOUNTER — Encounter: Payer: Self-pay | Admitting: Family Medicine

## 2012-04-24 ENCOUNTER — Ambulatory Visit (INDEPENDENT_AMBULATORY_CARE_PROVIDER_SITE_OTHER): Payer: Federal, State, Local not specified - PPO | Admitting: Family Medicine

## 2012-04-24 VITALS — BP 161/105 | HR 60 | Temp 98.2°F | Resp 22 | Wt 217.8 lb

## 2012-04-24 DIAGNOSIS — I10 Essential (primary) hypertension: Secondary | ICD-10-CM

## 2012-04-24 NOTE — Assessment & Plan Note (Addendum)
Blood pressure was low but is now elevated. We'll restart Ramaprill.  Will have patient check blood pressures at home and call if they get low again. Could consider stopping HCTZ instead of ACE inhibitor

## 2012-04-24 NOTE — Patient Instructions (Addendum)
Your weight was 233 pounds on April 25 Great job, keep it up!  Please restart the Ramipril Try to check your blood pressure at the same time everyday and check it again if you feel lightheaded If her blood pressure gets low again, give [place] a call If you're having trouble with your blood pressure, please bring your cuff in to check with our cuff

## 2012-04-24 NOTE — Progress Notes (Signed)
  Subjective:    Patient ID: [name], female    DOB: 25-Sep-1951, 61 y.o.   MRN: [id number]  HPI Patient comes in today for followup of her blood pressure. She had been seen previously because her blood pressure is low at the dentist. She thinks she may have been dehydrated at that time. Now she does not feel dehydrated and she is not have any dizziness when she stands up. Her blood pressures at home have been in the 160s over 100s. She is not taking her ramapril as she was told not to.  She denies chest pain, shortness breath, dizziness, vision change, headache.   Review of Systems See above    Objective:   Physical Exam Vital signs reviewed General appearance - alert, well appearing, and in no distress Heart - normal rate, regular rhythm, normal S1, S2, no murmurs, rubs, clicks or gallops Chest - clear to auscultation, no wheezes, rales or rhonchi, symmetric air entry, no tachypnea, retractions or cyanosis Extremities - peripheral pulses normal, no pedal edema, no clubbing or cyanosis       Assessment & Plan:

## 2012-05-13 ENCOUNTER — Ambulatory Visit (INDEPENDENT_AMBULATORY_CARE_PROVIDER_SITE_OTHER): Payer: Federal, State, Local not specified - PPO | Admitting: Family Medicine

## 2012-05-13 ENCOUNTER — Encounter: Payer: Self-pay | Admitting: Family Medicine

## 2012-05-13 VITALS — BP 116/70 | HR 78 | Temp 98.2°F | Ht 64.0 in | Wt 214.0 lb

## 2012-05-13 DIAGNOSIS — G894 Chronic pain syndrome: Secondary | ICD-10-CM

## 2012-05-13 MED ORDER — MORPHINE SULFATE ER 30 MG PO TBCR: 30.0000 mg | EXTENDED_RELEASE_TABLET | Freq: Two times a day (BID) | ORAL | Status: DC

## 2012-05-13 NOTE — Patient Instructions (Addendum)
Thank you for coming in today, it was nice to meet you I have increased your long acting pain medication. If you notice that you are becoming more drowsy on this please return to readjust your dose.  Be sure to follow up with Dr. McDiarmid in the next three months

## 2012-05-13 NOTE — Progress Notes (Signed)
Subjective:     Patient ID: [name], female   DOB: September 28, 1951, 61 y.o.   MRN: [id number]  HPI [name] is a 60y/o woman who comes to clinic today for management of her chronic pain.   She states that she has had pain in her left hip for years, and she does not believe her current regimen is working to control her symptoms. She states the pain is especially bad while she is at work as a Conservation officer, nature at [employer]. She reports that today the pain was so bad that she was brought to tears and had to leave early.   She is interested in possibly increasing her pain medication today, or perhaps switching back to Percocet, which she has taken in the past. She also requests a note for work that states that it would be beneficial for her to sit down when possible to relieve the pain.   Her goal is to have enough pain control to be able to continue to work.  She has no other complaints today, and denies any associated symptoms.   Review of Systems As per above.     Objective:   Physical Exam General: No acute distress HEENT: EOMI, PERRLA, neck supple and without adenopathy CV: RRR, no murmurs rubs or gallops Pulm: CTAB MSK: Strength in upper extremities is 5/5 bilaterally, strength in the lower extremities is 4/5 bilaterally. ROM is limited in the left hip, and pain with extension, flexion, and rotation is present. ROM in the right hip is decreased but better than R, and there is no pain with movement.   Assessment:    Plan:

## 2012-05-14 NOTE — Assessment & Plan Note (Signed)
Sources of chronic pain : Bilateral Hip Osteoarthritis. Lumbar OA with Spinal Stenosis, Restless Leg Syndrome. Left medial anterior knee pain that I suspect is osteoarthritis pain.   At this point patients pain seems poorly controlled, to the point that it is interfering with her daily activities and work.  I think increasing her basal pain medication may be most helpful for her.  Discussed with Dr. [place] and given Dr. McDiarmids absence and her poorly controlled pain it would be ok to increase even though I am not her pcp. Will increase MS Contin to 30mg  BID.  Given new Rx for MS Contin 30mg  tablet to be filled on 05/13/12, 06/13/12, and 07/14/12.  Rx for MS Contin handwritten by Dr. Perley Jain and dated for 7/15, 8/15 and 9/15 collected today and placed in shredder bin  She may continue vicodin for breakthrough pain.  No aberrant drug seeking behavior found.   Patient to see Dr McDiarmid in 07/2012 or 08/2012 for chronic pain syndrome.

## 2012-05-18 ENCOUNTER — Other Ambulatory Visit: Payer: Self-pay | Admitting: Family Medicine

## 2012-05-18 DIAGNOSIS — E039 Hypothyroidism, unspecified: Secondary | ICD-10-CM

## 2012-05-20 NOTE — Telephone Encounter (Signed)
Please notify patient she needs a TSH blood test before more refills on her thyroid medicaiton  Thanks  LC

## 2012-05-20 NOTE — Telephone Encounter (Signed)
Patient informed, expressed understanding. 

## 2012-06-12 ENCOUNTER — Ambulatory Visit (INDEPENDENT_AMBULATORY_CARE_PROVIDER_SITE_OTHER): Payer: Federal, State, Local not specified - PPO | Admitting: Family Medicine

## 2012-06-12 ENCOUNTER — Telehealth: Payer: Self-pay | Admitting: Family Medicine

## 2012-06-12 DIAGNOSIS — G894 Chronic pain syndrome: Secondary | ICD-10-CM

## 2012-06-12 MED ORDER — CYCLOBENZAPRINE HCL 10 MG PO TABS: 10.0000 mg | ORAL_TABLET | Freq: Three times a day (TID) | ORAL | Status: DC | PRN

## 2012-06-12 NOTE — Patient Instructions (Signed)
Dear [name],   It was great to see you today. Thank you for coming to clinic. Please read below regarding the issues that we discussed.   1. I am sorry your back pain is flaring up. I would like for you to get rest from being active/on your feet a lot. I want you to take off of work until at least 8/13. I ask your employer to excuse you until that time.  2. For your spasms in your back, I sent in a prescription for you for flexeril. Do not take while driving.  3. Please ice your lower back 15 minutes per session, 3x per day.  4. Finally, please remember the reasons we discussed to return.   Sincerely,  Dr. Tana Conch

## 2012-06-12 NOTE — Assessment & Plan Note (Signed)
Flare in chronic back pain. Patient with tender and tight Paraspinous muscles. Will treat with Flexeril TID for muscle spasm. Patient is also to use Ice packs TID and can continue her warm backs. Given reasons for return. Explained that this is not likely radiculopathy given that she has pain in her knee and in her lower back but pain does not appear to radiate from one to the other.   Also believe anxiety contributing, patient very comforted by the end of our visit. She went from crying constantly to smiling.

## 2012-06-12 NOTE — Telephone Encounter (Signed)
Spoke with patient . states she has been told in past that she has a herniated disk. Flares up from time to time. This current episode started 08/04. She is crying over the phone. States pain is in lower back radiating down legs. Appointment  scheduled now. She will be here in 15 minutes.

## 2012-06-12 NOTE — Progress Notes (Signed)
Subjective:  1. Back pain-patient with chronic back pain and leg pain related to multiple issues including bilateral Hip Osteoarthritis. Lumbar OA with Spinal Stenosis, reportedly fibromyalgia, Restless Leg Syndrome and Left medial anterior knee pain.   Patient states she is currently having a flare of pain. Tax free weekend and she works at [employer] on her feet and very busy. Noted lower back pain starting on Sunday got worse on Monday. She thought things were improving on Tuesday but at nighttime things flared again. She has been sleeping on an air mattress and states this has been helpful over last few months. She also wears a back brace which has helped and she has even been wearing it at nighttime over last few days. Patient was seen one month ago and had MS contin increased to 30mg  BID. She also has vicodin which she uses up to 3x per day. Patient also has knee pain related to her OA which seems to have flared since the weekend. Patient was concerned that this may be radiating pain but pain does not go down the back of her thigh and is not located in her buttocks. All located in lower back and associated Paraspinous muscles. Relief with warm baths and stretching out/layidn down.   ROS-History negative for trauma, history of cancer, fever, chills, weight loss, recent bacterial infection, recent IV drug use, HIV, pain worse at night or while supine, saddle anesthesia, bladder issues,  focal neurological deficits including weakness.   2. Depression/anxiety-continues to take ativan prn and trazodone. Patient admits this time has been very tough with her and she really needed someone tot alk to.    ROS--See HPI  Past Medical History-HTN, anxiety, depression, COPD, former smoker Reviewed problem list.  Medications- reviewed and updated Chief complaint-noted  Objective:  Gen: patient crying actively when I walked in room, by end of visit patient is smiling and appears with improved mood/comfort.  Obese.  CV: RRR no mrg Lungs: CTAB MSK and neuro: left knee with full range of motion. No joint line tenderness. Some reported pain with movement. 5/5 muscle strength and intact sensation in bilateral lower extremities. 2+ reflexes. No edema or calf enlargement.   No saddle anesthesia.  Back - Normal skin, Spine with normal alignment and no deformity.  No tenderness to vertebral process palpation.  Paraspinous muscles are tender and somewhat tight.    Range of motion is full at neck.    Assessment/Plan: See problem oriented charted  BP elevated but patient acutely in pain.

## 2012-06-12 NOTE — Telephone Encounter (Signed)
Is asking to speak with nurse about the pain in her legs -

## 2012-06-18 ENCOUNTER — Encounter: Payer: Self-pay | Admitting: Family Medicine

## 2012-06-18 ENCOUNTER — Ambulatory Visit (INDEPENDENT_AMBULATORY_CARE_PROVIDER_SITE_OTHER): Payer: Federal, State, Local not specified - PPO | Admitting: Family Medicine

## 2012-06-18 VITALS — BP 129/86 | HR 59 | Temp 98.3°F | Ht 64.0 in | Wt 198.5 lb

## 2012-06-18 DIAGNOSIS — G894 Chronic pain syndrome: Secondary | ICD-10-CM

## 2012-06-18 MED ORDER — MELOXICAM 7.5 MG PO TABS: 7.5000 mg | ORAL_TABLET | Freq: Every day | ORAL | Status: DC

## 2012-06-18 MED ORDER — CYCLOBENZAPRINE HCL 10 MG PO TABS: 10.0000 mg | ORAL_TABLET | Freq: Three times a day (TID) | ORAL | Status: DC | PRN

## 2012-06-18 NOTE — Patient Instructions (Addendum)
Thank you for coming in today I think your pain is getting better. I will refill your flexeril for the next 2 months I would also like for you try taking Meloxicam. You may be allergic to this medication so please take is when family is around. You may try benadryl if you develop hives. Stop taking this medication if you have any allergic reaction.  Please come back to see Dr. Durene Cal at your next scheduled appointment

## 2012-06-18 NOTE — Assessment & Plan Note (Signed)
Refill Flexeril for 30 days w/ 1 refill. Will trial Meloxicam. Discussed risks and benefits at length w/ pt and amenable to trying. Has plan in place if allergic reaction. To follow up w/ PCP for further management

## 2012-06-18 NOTE — Progress Notes (Signed)
  Subjective:    Patient ID: [name], female    DOB: 1951/08/11, 61 y.o.   MRN: [id number]  HPI CC: back pain  Back pain: Seen  In clinic by Dr. Durene Cal (PCP) for back pain exacerbation. Started on Flexeril 10mg  TID at that time as a trial. Significant benefit per pt w/ minimal sedation. Able to better cary out daily activities while on Flexeril. Ran out of Flexeril on Sunday and pain has become unbarable. Reviewed PMHx regarding her back pain. Pain is identical to what has been previously charted. Has tried Cymbalta w/o relief. Is scared to try Lyrica due to friend who "swelled on the medication." Neurontin caused hives. Has tried various NSAIDs w/ mixed degrees of improvement and allergic reaction involving hives.   Very labile emotionally. Crying to smiling w/in minutes, and then crying again.  Review of Systems Denies any loss of bowel or bladder function, saddle parasthesia, falls, loss of motor function, trauma/injury    Objective:   Physical Exam Gen: WNWD, mild distress Musc: paraspinal tenderness in lumbar region. Back brace in place. No point tenderness along back. 3+ strength at flexion of the L hip, 5+ on R. Extension 5+ bilat in LE.        Assessment & Plan:

## 2012-06-28 ENCOUNTER — Other Ambulatory Visit: Payer: Self-pay | Admitting: *Deleted

## 2012-07-01 NOTE — Telephone Encounter (Signed)
Patient's PCP is Dr McDiarmid.  I am routing the refill to Dr. Konrad Dolores since he is the one who originally prescribed it for [name].  Patient has appointment scheduled 9/16 with Dr. Durene Cal and 9/30 with Dr. McDiarmid.    [name]

## 2012-07-02 ENCOUNTER — Telehealth: Payer: Self-pay | Admitting: Family Medicine

## 2012-07-02 MED ORDER — MELOXICAM 7.5 MG PO TABS: 7.5000 mg | ORAL_TABLET | Freq: Every day | ORAL | Status: DC

## 2012-07-02 NOTE — Telephone Encounter (Signed)
States that the Meloxicam is working pretty well and would like to continue with this for now - [facility]- [address]

## 2012-07-22 ENCOUNTER — Encounter: Payer: Self-pay | Admitting: Family Medicine

## 2012-07-22 ENCOUNTER — Ambulatory Visit (INDEPENDENT_AMBULATORY_CARE_PROVIDER_SITE_OTHER): Payer: Federal, State, Local not specified - PPO | Admitting: Family Medicine

## 2012-07-22 VITALS — BP 119/77 | HR 67 | Temp 97.7°F | Ht 64.0 in | Wt 194.0 lb

## 2012-07-22 DIAGNOSIS — E039 Hypothyroidism, unspecified: Secondary | ICD-10-CM

## 2012-07-22 DIAGNOSIS — G894 Chronic pain syndrome: Secondary | ICD-10-CM

## 2012-07-22 DIAGNOSIS — Z23 Encounter for immunization: Secondary | ICD-10-CM

## 2012-07-22 DIAGNOSIS — R7309 Other abnormal glucose: Secondary | ICD-10-CM

## 2012-07-22 DIAGNOSIS — E669 Obesity, unspecified: Secondary | ICD-10-CM

## 2012-07-22 DIAGNOSIS — E7800 Pure hypercholesterolemia, unspecified: Secondary | ICD-10-CM

## 2012-07-22 LAB — TSH: TSH: 2.379 u[IU]/mL (ref 0.350–4.500)

## 2012-07-22 MED ORDER — MELOXICAM 7.5 MG PO TABS: 7.5000 mg | ORAL_TABLET | Freq: Every day | ORAL | Status: DC

## 2012-07-22 NOTE — Assessment & Plan Note (Signed)
Check TSH today before refilling levothyroxine.

## 2012-07-22 NOTE — Assessment & Plan Note (Signed)
Praised weight loss efforts! Patient requests that I not forward message to Dr. Perley Jain as she is excited for him to see her change nad hard work over the last few months.

## 2012-07-22 NOTE — Patient Instructions (Signed)
Dear [name],   It was great to see you today. Thank you for coming to clinic. Please read below regarding the issues that we discussed.   1. I am glad your chronic pain is doing so much better. We have refilled your Meloxicam. I am glad you haven't had any reaction to it as previous NSAIds.  2. We will check your thyroid today and I will send in a refill if needed.   Please follow up in clinic in clinic with dr. Perley Jain on 9/30. Please call earlier if you have any questions or concerns.   Sincerely,  Dr. Tana Conch   My 5 to Fitness! These are tips I give to every patient that  are important for living a healthy life!   5: fruits and vegetables per day (work on 9 per day if you are at 5) 4: exercise 4-5 times per week for at least 30 minutes (walking counts!) 3: meals per day (don't skip breakfast!) 2: habits to quit -smoking -excess alcohol use (men >2 beer/day; women >1beer/day) 1: sweet per day (2 cookies, 1 small cup of ice cream, 12 oz soda)  These are general tips for healthy living. Try to start with 1 or 2 habit TODAY and make it a part of your life for several months. Once you have 1 or 2 habits down for several months, try to begin working on your next healthy habit. With every single step you take, you will be leading a healthier lifestyle!   Health Maintenance Due  Topic Date Due  . Tetanus/tdap  03/06/2012

## 2012-07-22 NOTE — Assessment & Plan Note (Signed)
CHeck LDL today.

## 2012-07-22 NOTE — Assessment & Plan Note (Addendum)
Improved. Refilled Mobic. Believe weight loss is helping patient's chronic pain from OA in back and knees. WIll see Dr. Perley Jain to reestablish in 2 weeks.

## 2012-07-22 NOTE — Progress Notes (Signed)
Subjective:   1. Chronic back pain follow up-patient states function improved greatly with Mobic that Dr. Konrad Dolores prescribed. She has run out of flexeril but is no longer requiring. SHe also states using less of her narcotics due to the Mobic. She says she used to be able to predict whether with her chronic pain but since starting NSAID she can no longer do that. NO reaction to medicine as previous NSAIDS. Requests refill  Pain much assisted by being able to use stool at work. Patient very excited about work where she is the "top seller" for debit cards at [employer] and says they really want to keep her around and make her comfortable.   2. Obesity-patient reports losing 55 lbs on a low carb diet since Easter Sunday. SHe is thrilled to have Dr. McDiarmid see her in late September.  SHe is not exercising yet but considering getting into pool where she lives to minimize impact but not yet ready to go back to [facility] for water aerobics. Review of records show BMI down from 42 to 33 at this time. Patient with goal of losing another few lbs before seeing Dr. McDiarmid next.   ROS--See HPI  Past Medical History-anxiety, chronic pain, COPD, depression Reviewed problem list.  Medications- reviewed and updated Chief complaint-noted  Objective: BP 119/77  Pulse 67  Temp 97.7 F (36.5 C) (Oral)  Ht 5\' 4"  (1.626 m)  Wt 194 lb (87.998 kg)  BMI 33.30 kg/m2 Gen: NAD, very cheerful, mood not labile as previous  Assessment/Plan: See problem oriented charted

## 2012-07-22 NOTE — Assessment & Plan Note (Addendum)
Deferred A1c at this time though likely has improved with weight loss.

## 2012-07-23 ENCOUNTER — Encounter: Payer: Self-pay | Admitting: Family Medicine

## 2012-07-23 LAB — COMPREHENSIVE METABOLIC PANEL
AST: 21 U/L (ref 0–37)
Albumin: 4.6 g/dL (ref 3.5–5.2)
Alkaline Phosphatase: 45 U/L (ref 39–117)
BUN: 16 mg/dL (ref 6–23)
Creat: 0.71 mg/dL (ref 0.50–1.10)
Potassium: 4.4 mEq/L (ref 3.5–5.3)
Total Bilirubin: 0.4 mg/dL (ref 0.3–1.2)

## 2012-07-27 ENCOUNTER — Telehealth: Payer: Self-pay | Admitting: Family Medicine

## 2012-07-27 NOTE — Telephone Encounter (Signed)
Patient is nervous she lives alone. States she ate a lot of cabbage with potentially bad meat this evening. Having lots of gas and belching, she belches on the phone. Took two gas pills. Denies any pain. Did not take bedtime medications today. She went to her neighbors house because she was nervous. Is nervous about taking her MS contin dose. i advised it is reasonable to monitor symptoms for short time, then if do not resolve use her judgement to come into ED to evaluate for more concerning etiologies.

## 2012-08-02 ENCOUNTER — Other Ambulatory Visit: Payer: Self-pay | Admitting: Family Medicine

## 2012-08-02 ENCOUNTER — Telehealth: Payer: Self-pay | Admitting: Family Medicine

## 2012-08-02 MED ORDER — LEVOTHYROXINE SODIUM 50 MCG PO TABS: 50.0000 ug | ORAL_TABLET | Freq: Every day | ORAL | Status: DC

## 2012-08-02 NOTE — Telephone Encounter (Signed)
LVM for patient to call back. Nurses to relay following message.  TSH was ok, will refill thyroid medications today.   "Bad Cholesterol"/LDL was at goal of less than 100 at 78.   Cannot tell risk of diabetes since patient was not fasting.   Sodium was slightly low but will just monitor in future as only minimal and possibly transiently low. Other kidney and liver function tests were ok.

## 2012-08-05 ENCOUNTER — Ambulatory Visit (INDEPENDENT_AMBULATORY_CARE_PROVIDER_SITE_OTHER): Payer: Federal, State, Local not specified - PPO | Admitting: Family Medicine

## 2012-08-05 ENCOUNTER — Encounter: Payer: Self-pay | Admitting: Family Medicine

## 2012-08-05 VITALS — BP 142/81 | HR 58 | Temp 98.2°F | Ht 64.0 in | Wt 191.5 lb

## 2012-08-05 DIAGNOSIS — R7309 Other abnormal glucose: Secondary | ICD-10-CM

## 2012-08-05 DIAGNOSIS — I10 Essential (primary) hypertension: Secondary | ICD-10-CM

## 2012-08-05 DIAGNOSIS — G894 Chronic pain syndrome: Secondary | ICD-10-CM

## 2012-08-05 DIAGNOSIS — F458 Other somatoform disorders: Secondary | ICD-10-CM

## 2012-08-05 DIAGNOSIS — E669 Obesity, unspecified: Secondary | ICD-10-CM

## 2012-08-05 DIAGNOSIS — G2581 Restless legs syndrome: Secondary | ICD-10-CM

## 2012-08-05 DIAGNOSIS — E66811 Obesity, class 1: Secondary | ICD-10-CM

## 2012-08-05 DIAGNOSIS — F339 Major depressive disorder, recurrent, unspecified: Secondary | ICD-10-CM

## 2012-08-05 MED ORDER — MORPHINE SULFATE ER 30 MG PO TBCR: 30.0000 mg | EXTENDED_RELEASE_TABLET | Freq: Two times a day (BID) | ORAL | Status: DC

## 2012-08-05 MED ORDER — POTASSIUM CHLORIDE ER 10 MEQ PO TBCR: 40.0000 meq | EXTENDED_RELEASE_TABLET | Freq: Every day | ORAL | Status: DC

## 2012-08-05 MED ORDER — HYDROCODONE-ACETAMINOPHEN 10-500 MG PO TABS: 1.0000 | ORAL_TABLET | Freq: Four times a day (QID) | ORAL | Status: DC | PRN

## 2012-08-05 MED ORDER — MELOXICAM 7.5 MG PO TABS: 7.5000 mg | ORAL_TABLET | Freq: Every day | ORAL | Status: DC

## 2012-08-05 MED ORDER — CYCLOBENZAPRINE HCL 10 MG PO TABS: 10.0000 mg | ORAL_TABLET | Freq: Three times a day (TID) | ORAL | Status: AC | PRN

## 2012-08-05 NOTE — Patient Instructions (Signed)
Great job with your weight loss. Your blood pressure looks good.

## 2012-08-06 ENCOUNTER — Encounter: Payer: Self-pay | Admitting: Family Medicine

## 2012-08-06 MED ORDER — LEVOTHYROXINE SODIUM 50 MCG PO TABS: 150.0000 ug | ORAL_TABLET | Freq: Every day | ORAL | Status: DC

## 2012-08-06 NOTE — Assessment & Plan Note (Signed)
Improved with Low carbohydrate diet.

## 2012-08-06 NOTE — Assessment & Plan Note (Signed)
Adequate control of symtoms with hydrocodone/APAP once or twice a week. Tolerating medication. Plan to continue current regiment.

## 2012-08-06 NOTE — Assessment & Plan Note (Signed)
On Oleptro and lorazepam and trazodone. Current mood stable, no ahedonia. Tolerating medications.  Followed by Dr Betti Cruz (Psych)

## 2012-08-06 NOTE — Progress Notes (Signed)
  Subjective:    Patient ID: [name], female    DOB: Sep 16, 1951, 61 y.o.   MRN: [id number]  HPI Patient Active Problem List  Diagnosis  . Obesity (BMI 30.0-34.9) Pt has lost considerable weight over last several months primarily thru Low carbohydrate diet  . DEPRESSION, MAJOR, RECURRENT Stable mood.  No ahedonia. Recently moved into her own apartment. Followed by Dr Betti Cruz (Psych).  No recent changes in medications.    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  Probiotic does help her sxs when she can afford to take them.   . RESTLESS LEGS SYNDROME: Improved since loss of weight, often goes nights without having to use the Vidodin.  . CHRONIC PAIN SYNDROME Improved pain control in hips and knees since increase in MS Contin to 30 mg twice a day and as needed use of Meloxicam.  Pt takes Flexeril as needed for hip muscle spasm.   Marland Kitchen HYPERTENSION, BENIGN SYSTEMIC CHRONIC HYPERTENSION  Disease Monitoring  Blood pressure range: not monitoring at home  Chest pain: no   Dyspnea: no   Claudication: no   Medication compliance: yes  Medication Side Effects  Lightheadedness: no   Urinary frequency: no   Edema: no    Preventitive Healthcare:  Exercise: no   Diet Pattern: Low carbohydrate diet  Salt Restriction: yes, except salts vegetables.    . Esophageal Reflux - Worsens when runs out of omeprazole  . PREDIABETES Working on weight loss  No Smoking Review of SystemsSee HPI     Objective:   Physical Exam  Constitutional: Vital signs are normal. She is cooperative. No distress.  Cardiovascular: Normal rate, regular rhythm and normal heart sounds.   Pulmonary/Chest: Effort normal and breath sounds normal.  Musculoskeletal: She exhibits no edema.  Psychiatric: She has a normal mood and affect. Her speech is normal and behavior is normal. Thought content normal. Cognition and memory are normal.           Assessment & Plan:

## 2012-08-06 NOTE — Assessment & Plan Note (Signed)
Adequate symptoms control. Tolerating medications.   Continue current medications.

## 2012-08-06 NOTE — Assessment & Plan Note (Addendum)
Adequate pain control. Able to participate in ADL, iADLs and work. No significant adverse effects. No aberrant medication seeking behaviors.  3 Rxs for MS contin 30 mg Disp#60 (fill 08/05/12, 09/04/12, 10/05/12) Rx for hydrocodone/Apap 10/500 Disp#60, RF-2.   RTC in 3 months

## 2012-08-06 NOTE — Assessment & Plan Note (Signed)
Adequate BP control. Tolerating medications.  No new end-organ damage.  Continue current medications.  

## 2012-08-06 NOTE — Assessment & Plan Note (Signed)
Lab Results  Component Value Date   HGBA1C 5.7 08/05/2012  Improved. Continue weight loss.

## 2012-09-17 ENCOUNTER — Telehealth: Payer: Self-pay | Admitting: Family Medicine

## 2012-09-17 NOTE — Telephone Encounter (Signed)
Pt called emergency line c/o elevated BP. States she had a headache today and check 180/110. She got nervous and felt anxiety like her typical anxiety attacks. 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 She decided to take a nitro tablet. She has not checked her BP at home in the past month prior to today.  Her range in clinic is typical 160-120/90-70s. She did eat some radishes with salt this evening.  She currently denies any chest pain, palpitations, dyspnea.   I advised patient that I am unable to fully assess symptoms over the phone, but if she has any definite red flags chest pain, dyspnea, worsening headache then come to the ER. If her symptoms are not as concerning and may be related to her anxiety, then to call clinic for work in appt in the morning.

## 2012-09-18 ENCOUNTER — Emergency Department (HOSPITAL_COMMUNITY): Payer: Federal, State, Local not specified - PPO

## 2012-09-18 ENCOUNTER — Encounter (HOSPITAL_COMMUNITY): Payer: Self-pay | Admitting: *Deleted

## 2012-09-18 ENCOUNTER — Telehealth: Payer: Self-pay | Admitting: Family Medicine

## 2012-09-18 ENCOUNTER — Emergency Department (HOSPITAL_COMMUNITY)
Admission: EM | Admit: 2012-09-18 | Discharge: 2012-09-18 | Disposition: A | Discharge status: Home / Self Care | Payer: Federal, State, Local not specified - PPO | Attending: Emergency Medicine | Admitting: Emergency Medicine

## 2012-09-18 DIAGNOSIS — Z860100 Personal history of colon polyps, unspecified: Secondary | ICD-10-CM | POA: Insufficient documentation

## 2012-09-18 DIAGNOSIS — Z79899 Other long term (current) drug therapy: Secondary | ICD-10-CM | POA: Insufficient documentation

## 2012-09-18 DIAGNOSIS — Z8614 Personal history of Methicillin resistant Staphylococcus aureus infection: Secondary | ICD-10-CM | POA: Insufficient documentation

## 2012-09-18 DIAGNOSIS — E559 Vitamin D deficiency, unspecified: Secondary | ICD-10-CM | POA: Insufficient documentation

## 2012-09-18 DIAGNOSIS — L509 Urticaria, unspecified: Secondary | ICD-10-CM | POA: Insufficient documentation

## 2012-09-18 DIAGNOSIS — H5702 Anisocoria: Secondary | ICD-10-CM

## 2012-09-18 DIAGNOSIS — F419 Anxiety disorder, unspecified: Secondary | ICD-10-CM

## 2012-09-18 DIAGNOSIS — Z7982 Long term (current) use of aspirin: Secondary | ICD-10-CM | POA: Insufficient documentation

## 2012-09-18 DIAGNOSIS — R0602 Shortness of breath: Secondary | ICD-10-CM | POA: Insufficient documentation

## 2012-09-18 DIAGNOSIS — I10 Essential (primary) hypertension: Secondary | ICD-10-CM | POA: Insufficient documentation

## 2012-09-18 DIAGNOSIS — R519 Headache, unspecified: Secondary | ICD-10-CM

## 2012-09-18 DIAGNOSIS — Z8719 Personal history of other diseases of the digestive system: Secondary | ICD-10-CM | POA: Insufficient documentation

## 2012-09-18 DIAGNOSIS — E7800 Pure hypercholesterolemia, unspecified: Secondary | ICD-10-CM | POA: Insufficient documentation

## 2012-09-18 DIAGNOSIS — R0789 Other chest pain: Secondary | ICD-10-CM | POA: Insufficient documentation

## 2012-09-18 DIAGNOSIS — R51 Headache: Secondary | ICD-10-CM | POA: Insufficient documentation

## 2012-09-18 DIAGNOSIS — Z8709 Personal history of other diseases of the respiratory system: Secondary | ICD-10-CM | POA: Insufficient documentation

## 2012-09-18 DIAGNOSIS — F411 Generalized anxiety disorder: Secondary | ICD-10-CM | POA: Insufficient documentation

## 2012-09-18 LAB — CBC WITH DIFFERENTIAL/PLATELET
Eosinophils Relative: 1 % (ref 0–5)
HCT: 38.7 % (ref 36.0–46.0)
Hemoglobin: 13.3 g/dL (ref 12.0–15.0)
Lymphocytes Relative: 32 % (ref 12–46)
Lymphs Abs: 2.3 10*3/uL (ref 0.7–4.0)
MCV: 88.4 fL (ref 78.0–100.0)
Monocytes Absolute: 0.8 10*3/uL (ref 0.1–1.0)
Monocytes Relative: 11 % (ref 3–12)
Neutro Abs: 4.1 10*3/uL (ref 1.7–7.7)
RBC: 4.38 MIL/uL (ref 3.87–5.11)
RDW: 13.8 % (ref 11.5–15.5)
WBC: 7.3 10*3/uL (ref 4.0–10.5)

## 2012-09-18 LAB — BASIC METABOLIC PANEL
BUN: 20 mg/dL (ref 6–23)
CO2: 24 mEq/L (ref 19–32)
Calcium: 9.3 mg/dL (ref 8.4–10.5)
Chloride: 93 mEq/L — ABNORMAL LOW (ref 96–112)
Creatinine, Ser: 0.62 mg/dL (ref 0.50–1.10)
Glucose, Bld: 104 mg/dL — ABNORMAL HIGH (ref 70–99)

## 2012-09-18 MED ORDER — MUPIROCIN 2 % EX OINT
TOPICAL_OINTMENT | CUTANEOUS | Status: AC
  Filled 2012-09-18: qty 22

## 2012-09-18 MED ORDER — DIAZEPAM 5 MG PO TABS
5.0000 mg | ORAL_TABLET | Freq: Once | ORAL | Status: AC
  Administered 2012-09-18: 5 mg via ORAL
  Filled 2012-09-18: qty 1

## 2012-09-18 MED ORDER — CEFAZOLIN SODIUM-DEXTROSE 2-3 GM-% IV SOLR
INTRAVENOUS | Status: AC
  Filled 2012-09-18: qty 50

## 2012-09-18 MED ORDER — METOCLOPRAMIDE HCL 5 MG/ML IJ SOLN
10.0000 mg | Freq: Once | INTRAMUSCULAR | Status: AC
  Administered 2012-09-18: 10 mg via INTRAVENOUS
  Filled 2012-09-18: qty 2

## 2012-09-18 MED ORDER — DIPHENHYDRAMINE HCL 50 MG/ML IJ SOLN
25.0000 mg | Freq: Once | INTRAMUSCULAR | Status: AC
  Administered 2012-09-18: 17:00:00 via INTRAVENOUS
  Filled 2012-09-18: qty 1

## 2012-09-18 NOTE — ED Notes (Signed)
Pt now in room; pt undressed, in gown, on monitor, continuous pulse oximetry and blood pressure cuff

## 2012-09-18 NOTE — Telephone Encounter (Signed)
Patient is calling because her Blood Pressures have been all over the place.  She spoke to the On Call MD last night and they discussed her symptoms.  She took her BP through the night and this morning and it is still high. She really needs to speak to a nurse asap.

## 2012-09-18 NOTE — ED Notes (Signed)
Visual Acuity screening: pt was 20/200 in [facility] and [facility] eye. Pt is 20/40 with prescription lenses

## 2012-09-18 NOTE — ED Provider Notes (Signed)
Medical screening examination/treatment/procedure(s) were performed by non-physician practitioner and as supervising physician I was immediately available for consultation/collaboration.   Richardean Canal, MD 09/18/12 [phone]

## 2012-09-18 NOTE — ED Notes (Signed)
Pt is here with chest burning with shortness of breath.  Pt started having left sided headache yesterday and stated bp was high.  Then patient reports blurred vision since yesterday.  Restless sleep over last 2 nites

## 2012-09-18 NOTE — Telephone Encounter (Signed)
Last night  she felt her BP was elevated   She called doctor on call and was told to monitor during night . BP was 180/98. 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  During the night BP was 137/78   Now she is on her way to work and has to clock in at 12:00.  States yesterday day  she had some burning in chest and discomfort left side of chest. She used a  NGT and it did help. Having some headache today also.  Earlier this AM BP was 170/103. Has not had to use NGT today , but states her chest feels weird. Advised she should go to ED now for evaluation. She voices understanding.

## 2012-09-18 NOTE — ED Provider Notes (Signed)
History     CSN: [id number]  Arrival date & time 09/18/12  1213   First MD Initiated Contact with Patient 09/18/12 1529      Chief Complaint  Patient presents with  . chest burning   . Shortness of Breath  . Headache  . Hypertension    (Consider location/radiation/quality/duration/timing/severity/associated sxs/prior treatment) HPI Comments: 61 year old female with a history of hypertension, hyperlipidemia, fibromyalgia and anxiety presents emergency department with a chief complaint of headache.  Onset of symptoms began Monday afternoon when the patient realized her blood pressure was elevated.    Headache location was left side of head and described as a throbbing sharp pain and increased scalp sensation.  Associated symptoms include blurred vision and hypertension.Patient states that she has been compliant with her blood pressure medication including HCTZ, and Metroprolol. In addition pt reports insomnia, anxiety, mild chest pain that she associates with her anxiety/ panic attacks. She reports "freaking out" after seeing how high her BP was. Pt found some nitro at her house and has taken nitro and ASA prior to arrival.   The history is provided by the patient.    Past Medical History  Diagnosis Date  . History of MRSA infection 04/28/2011  . Solitary lung nodule 04/08/2011  . Hepatic steatosis 04/12/2011  . SIGMOID POLYP 01/23/2008  . HYPOTHYROIDISM, BORDERLINE 02/15/2007  . VITAMIN D DEFICIENCY 03/03/2010  . LACTOSE INTOLERANCE 03/10/2008  . HYPERCHOLESTEROLEMIA 12/24/2008  . Hypertension   . Urticaria, chronic 05/22/2011    Past Surgical History  Procedure Date  . Bunionectomy 1992    bilateral feet with pins in great toes  . Bladder tack 1998    Dr Annabell Howells (urology)  . Laparoscopy for ectopic pregnancy   . Colonoscopy 2004    Dr Arty Baumgartner (GI)  . Cystocele repair 1998     Dr Huntley Dec (GYN)-  . Rectocele repair 1998    Dr Huntley Dec (GYN)  . Esophagogastroduodenoscopy   . Injection  hip intra articular 2008    Left Hip fluoroscopically-guided corticosteorid injection for painful osteoarthritis with good effect (06/2007)  . Exploratory laparotomy   . Facial lacerations repair     Facial Laceration repair as adolescent   . Nm myoview ltd 2011    Dr Verdis Prime, III (Card)  . Carpal tunnel release 2010    Bilateral, Dr Josephine Igo    Family History  Problem Relation Age of Onset  . Diabetes type II    . Hypertension    . Heart attack    . Alcohol abuse    . Depression    . Asthma    . Migraines      History  Substance Use Topics  . Smoking status: Former Smoker -- 1.5 packs/day for 44 years    Types: Cigarettes    Quit date: 04/06/2008  . Smokeless tobacco: Former Neurosurgeon    Quit date: 04/06/2008  . Alcohol Use: No    OB History    Grav Para Term Preterm Abortions TAB SAB Ect Mult Living                  Review of Systems  Constitutional: Negative for fever, chills, diaphoresis, activity change and appetite change.  HENT: Negative for congestion and neck pain.   Eyes: Positive for visual disturbance. Negative for photophobia, pain, discharge, redness and itching.  Respiratory: Positive for chest tightness. Negative for cough and shortness of breath.   Cardiovascular: Negative for chest pain and leg swelling.  Gastrointestinal:  Negative for abdominal pain.  Genitourinary: Negative for dysuria, urgency and frequency.  Musculoskeletal: Negative for myalgias.  Skin: Negative for color change, rash and wound.  Neurological: Positive for headaches. Negative for dizziness, syncope, weakness, light-headedness and numbness.  Psychiatric/Behavioral: Positive for sleep disturbance. Negative for confusion. The patient is nervous/anxious.   All other systems reviewed and are negative.    Allergies  Aripiprazole; Diclofenac-misoprostol; Estradiol; Ibuprofen; Naproxen; Neurontin; Paroxetine; and Prednisone  Home Medications   Current Outpatient Rx  Name   Route  Sig  Dispense  Refill  . ACETAMINOPHEN 500 MG PO TABS   Oral   Take 500 mg by mouth. 1 tablets 3-4 times a day as needed for osteoarthritis pain.         . ASPIRIN 81 MG PO TABS   Oral   Take 81 mg by mouth daily.           . B COMPLEX PO TABS   Oral   Take 1 tablet by mouth daily.           Marland Kitchen CALCIUM CARBONATE-VITAMIN D 600-400 MG-UNIT PO TABS   Oral   Take 1 tablet by mouth 2 (two) times daily.           . CYCLOBENZAPRINE HCL 10 MG PO TABS   Oral   Take 10 mg by mouth 3 (three) times daily as needed. For muscle spasms         . DICYCLOMINE HCL 20 MG PO TABS   Oral   Take 1 tablet (20 mg total) by mouth every 6 (six) hours as needed (nausea, vomiting, burping).   240 tablet   3   . HYDROCHLOROTHIAZIDE 25 MG PO TABS   Oral   Take 1 tablet (25 mg total) by mouth daily.   90 tablet   3   . HYDROCODONE-ACETAMINOPHEN 10-500 MG PO TABS   Oral   Take 1 tablet by mouth every 6 (six) hours as needed. Fill on or after 04/20/12.   60 tablet   3   . HYDROXYZINE HCL 50 MG PO TABS   Oral   Take 150 mg by mouth at bedtime. May take one additional  tablet  a day as needed for hives         . LEVOTHYROXINE SODIUM 50 MCG PO TABS   Oral   Take 3 tablets (150 mcg total) by mouth daily.   270 tablet   1   . LORAZEPAM 1 MG PO TABS   Oral   Take 1 mg by mouth every 8 (eight) hours as needed. For anxiety         . MELOXICAM 7.5 MG PO TABS   Oral   Take 1 tablet (7.5 mg total) by mouth daily. As needed for pain   30 tablet   1   . METOPROLOL TARTRATE 50 MG PO TABS   Oral   Take 50 mg by mouth 2 (two) times daily.         . MORPHINE SULFATE ER 30 MG PO TBCR   Oral   Take 1 tablet (30 mg total) by mouth 2 (two) times daily.   60 tablet   0   . MULTIVITAMINS PO TABS   Oral   Take 1 tablet by mouth daily.   120 tablet   2   . NITROGLYCERIN 0.4 MG SL SUBL   Sublingual   Place 1 tablet (0.4 mg total) under the tongue every 5 (five) minutes as  needed. Call emergency squad if no relief by third tablet.   25 tablet   3   . OMEPRAZOLE 40 MG PO CPDR   Oral   Take 40 mg by mouth daily.         Marland Kitchen POLYETHYLENE GLYCOL 3350 PO POWD   Oral   Take 17 g by mouth daily. 2 tablespoons daily.         Marland Kitchen POTASSIUM CHLORIDE ER 10 MEQ PO TBCR   Oral   Take 40 mEq by mouth at bedtime.         Marland Kitchen PROBIOTIC FORMULA PO   Oral   Take 1 tablet by mouth.           Marland Kitchen PROCHLORPERAZINE MALEATE 10 MG PO TABS   Oral   Take 1 tablet (10 mg total) by mouth 4 (four) times daily as needed.   30 tablet   5   . RAMIPRIL 10 MG PO CAPS   Oral   Take 1 capsule (10 mg total) by mouth daily.   90 capsule   3   . TRAZODONE HCL ER 300 MG PO TB24   Oral   Take 300 mg by mouth at bedtime.            BP 133/68  Pulse 50  Temp 98.4 F (36.9 C) (Oral)  Resp 17  SpO2 96%  Physical Exam  Nursing note and vitals reviewed. Constitutional: She is oriented to person, place, and time. She appears well-developed and well-nourished. No distress.  HENT:  Head: Normocephalic and atraumatic.  Mouth/Throat: Oropharynx is clear and moist. No oropharyngeal exudate.  Eyes: Conjunctivae normal and EOM are normal. No scleral icterus.       Pupils reactive to direct and consensual light.   Neck: Normal range of motion. Neck supple. Normal carotid pulses and no JVD present. Carotid bruit is not present. No rigidity. No tracheal deviation and normal range of motion present. No thyromegaly present.  Cardiovascular: Normal rate, regular rhythm, S1 normal, S2 normal, normal heart sounds, intact distal pulses and normal pulses.  Exam reveals no gallop and no friction rub.   No murmur heard.      No pitting edema bilaterally, RRR, no aberrant sounds on auscultations, distal pulses intact, no carotid bruit or JVD.   Pulmonary/Chest: Effort normal and breath sounds normal. No accessory muscle usage or stridor. No respiratory distress. She has no wheezes. She  exhibits no tenderness and no bony tenderness.  Abdominal: Soft. Bowel sounds are normal.       Soft non tender. Non pulsatile aorta.   Musculoskeletal: Normal range of motion. She exhibits no edema and no tenderness.  Neurological: She is alert and oriented to person, place, and time. Coordination normal.       Anisocoria. CN III-XII intact. Good coordination. Normal gate. Sensory intact, strength 5/5 bilaterally   Skin: Skin is warm, dry and intact. No rash noted. She is not diaphoretic. No cyanosis or erythema. No pallor. Nails show no clubbing.  Psychiatric: She has a normal mood and affect. Her behavior is normal.    ED Course  Procedures (including critical care time)  Labs Reviewed  BASIC METABOLIC PANEL - Abnormal; Notable for the following:    Sodium 130 (*)     Chloride 93 (*)     Glucose, Bld 104 (*)     All other components within normal limits  CBC WITH DIFFERENTIAL  POCT I-STAT TROPONIN I  TROPONIN I   Dg  Chest 2 View  09/18/2012  *RADIOLOGY REPORT*  Clinical Data: Burning sensation in the left chest with difficulty breathing, shortness of breath and headache.  CHEST - 2 VIEW  Comparison: CT 01/25/2012.  Portable chest 01/25/2010.  Findings: The heart size and mediastinal contours are stable.  The lungs appear clear with mild interstitial prominence.  There is no airspace disease, pleural effusion or pneumothorax.  No acute osseous findings are seen.  IMPRESSION: No active cardiopulmonary process.   Original Report Authenticated By: Carey Bullocks, M.D.    Ct Head Wo Contrast  09/18/2012  *RADIOLOGY REPORT*  Clinical Data: headache and hypertension  CT HEAD WITHOUT CONTRAST  Technique:  Contiguous axial images were obtained from the base of the skull through the vertex without contrast.  Comparison: none  Findings:  There is diffuse patchy low density throughout the subcortical and periventricular white matter consistent with chronic small vessel ischemic change.  The brain  has an otherwise normal appearance without evidence for hemorrhage, infarction, hydrocephalus, or mass lesion.  There is no extra axial fluid collection.  The skull and paranasal sinuses are normal.  IMPRESSION:  1.  No acute intracranial abnormalities.   Original Report Authenticated By: Signa Kell, M.D.     Date: 09/18/2012  Rate: 65  Rhythm: normal sinus rhythm  QRS Axis: normal  Intervals: normal  ST/T Wave abnormalities: non specific changes  Conduction Disutrbances: none  Narrative Interpretation:   Old EKG Reviewed: No significant changes noted  No diagnosis found.  MDM  HA, Chest pain, anisocoria, HTN  Labs and imaging reviewed. Pt HA treated and improved while in ED. Non concerning for [facility], ICH, Meningitis, or temporal arteritis. Pt is afebrile with no focal neuro deficits, or nuchal rigidity. Chest pain is not likely of cardiac or pulmonary etiology d/t presentation, VSS, no tracheal deviation, no JVD or new murmur, RRR, breath sounds equal bilaterally, EKG without acute abnormalities, negative troponin x2, and negative CXR. Pt reports negative cath completed 2 years ago.  Pt has been advised to return to the ED if CP becomes exertional, associated with diaphoresis or nausea, radiates to left jaw/arm, worsens or becomes concerning in any way. In regards to anisocoria, pupils are reactive to direct and indirect light. Pt daughter in room is RN and states that visual acuity testing was not performed correctly, she verbalizes that they want to leave and she understands the importance of follow up with eye doctor ASAP. Discussed STRICT return precautions including vision change or pupils no longer reacting to light or sluggish reaction.  Pt and family verbalize understanding. Pt appears reliable for follow up and is agreeable to discharge.  Case has been discussed with and seen by Dr. Silverio Lay who agrees with the above plan to discharge.  Pt verbalizes understanding and is agreeable with plan  to dc.             Jaci Carrel, PA-C 09/18/12 [address] PA-C 09/18/12 2317

## 2012-09-24 ENCOUNTER — Encounter: Payer: Self-pay | Admitting: Family Medicine

## 2012-09-24 ENCOUNTER — Ambulatory Visit (INDEPENDENT_AMBULATORY_CARE_PROVIDER_SITE_OTHER): Payer: Federal, State, Local not specified - PPO | Admitting: Family Medicine

## 2012-09-24 VITALS — BP 146/82 | HR 56 | Temp 98.3°F | Ht 64.0 in | Wt 205.0 lb

## 2012-09-24 DIAGNOSIS — I10 Essential (primary) hypertension: Secondary | ICD-10-CM

## 2012-09-24 MED ORDER — HYDROCHLOROTHIAZIDE 50 MG PO TABS: 50.0000 mg | ORAL_TABLET | Freq: Every day | ORAL | Status: DC

## 2012-09-24 NOTE — Assessment & Plan Note (Signed)
Patient's recorded BP have been slightly above goal. On review of her anti-hypertensive medications she is on Ramipril 10mg , Lopressor 50 and HCTZ 25mg . I do not want to increase Lopressor since she is borderline bradycardic. Patient states she has extra HCTZ and she has been drinking green tea to lose water weight, therefore for now, I will increase HCTZ to 50mg  rather than increasing ACE. I have encouraged her to follow up in one month (and continue checking her BP at home) and we can discuss if we should increase ACE instead. She agrees with this plan.

## 2012-09-24 NOTE — Progress Notes (Signed)
Patient ID: RAFAEL QUESADA, female   DOB: [birth year], 61 y.o.   MRN: [id number] [facility] Krist Rosenboom M. Phelix Fudala, MD Phone: [phone]   Subjective: HPI: Patient is a 61 y.o. female presenting to clinic today for blood pressure follow up. Last week went to the ED with elevated blood pressures, severe HA and burning in chest. BP was 185/111 there, had CT scan and HA cocktail. BP came down and was discharged home. Patient realized she missed 3 doses of trazodone when her BP was so elevated. Since then, BP has been better but still elevated. Patient does excellent job keeping track of blood pressures. Taking HCTZ, Ramipril and Metoprolol and has not missed any doses. Overall, she feels better than she did last week. Denies CP, SOB, abd pain. Still reports some slightly HA but improved.   History Reviewed: Former smoker. Health Maintenance: UTD  ROS: Please see HPI above.  Objective: Office vital signs reviewed.  Physical Examination:  General: Awake, alert. NAD. Very pleasant, happy and talkative HEENT: Atraumatic, normocephalic Pulm: CTAB, no wheezes. Good effort Cardio: RRR, no murmurs appreciated. No chest wall tenderness Abdomen: soft, nontender, nondistended Extremities: No edema Neuro: Grossly intact  Assessment: 61 yo F with elevated BP  Plan: See Problem List and After Visit Summary

## 2012-09-24 NOTE — Patient Instructions (Signed)
It was nice to meet you today!  I have increased your HCTZ to 50mg  to hopefully control your blood pressure a little better. Please continue to keep track of your pressures.  Please make an appointment to see Dr. Perley Jain in December to discuss your blood pressure and your medications.  Take care! Amber M. Hairford, M.D.

## 2012-10-23 ENCOUNTER — Other Ambulatory Visit: Payer: Self-pay | Admitting: Family Medicine

## 2012-10-28 ENCOUNTER — Ambulatory Visit (INDEPENDENT_AMBULATORY_CARE_PROVIDER_SITE_OTHER): Payer: Federal, State, Local not specified - PPO | Admitting: Family Medicine

## 2012-10-28 ENCOUNTER — Encounter: Payer: Self-pay | Admitting: Family Medicine

## 2012-10-28 VITALS — BP 123/82 | HR 76 | Ht 64.0 in | Wt 216.7 lb

## 2012-10-28 DIAGNOSIS — I10 Essential (primary) hypertension: Secondary | ICD-10-CM

## 2012-10-28 DIAGNOSIS — E871 Hypo-osmolality and hyponatremia: Secondary | ICD-10-CM

## 2012-10-28 DIAGNOSIS — F458 Other somatoform disorders: Secondary | ICD-10-CM

## 2012-10-28 DIAGNOSIS — R911 Solitary pulmonary nodule: Secondary | ICD-10-CM

## 2012-10-28 DIAGNOSIS — F339 Major depressive disorder, recurrent, unspecified: Secondary | ICD-10-CM

## 2012-10-28 DIAGNOSIS — K929 Disease of digestive system, unspecified: Secondary | ICD-10-CM

## 2012-10-28 DIAGNOSIS — K589 Irritable bowel syndrome without diarrhea: Secondary | ICD-10-CM

## 2012-10-28 DIAGNOSIS — K319 Disease of stomach and duodenum, unspecified: Secondary | ICD-10-CM

## 2012-10-28 DIAGNOSIS — G2581 Restless legs syndrome: Secondary | ICD-10-CM

## 2012-10-28 DIAGNOSIS — G894 Chronic pain syndrome: Secondary | ICD-10-CM

## 2012-10-28 DIAGNOSIS — E876 Hypokalemia: Secondary | ICD-10-CM

## 2012-10-28 LAB — BASIC METABOLIC PANEL
BUN: 22 mg/dL (ref 6–23)
Chloride: 96 mEq/L (ref 96–112)
Glucose, Bld: 102 mg/dL — ABNORMAL HIGH (ref 70–99)
Potassium: 4 mEq/L (ref 3.5–5.3)
Sodium: 137 mEq/L (ref 135–145)

## 2012-10-28 MED ORDER — PROCHLORPERAZINE MALEATE 10 MG PO TABS: 10.0000 mg | ORAL_TABLET | Freq: Four times a day (QID) | ORAL | Status: DC | PRN

## 2012-10-28 MED ORDER — HYDROCHLOROTHIAZIDE 50 MG PO TABS: 25.0000 mg | ORAL_TABLET | Freq: Every day | ORAL | Status: DC

## 2012-10-28 MED ORDER — CYCLOBENZAPRINE HCL 10 MG PO TABS: 10.0000 mg | ORAL_TABLET | Freq: Every day | ORAL | Status: DC | PRN

## 2012-10-28 MED ORDER — RAMIPRIL 10 MG PO CAPS: 10.0000 mg | ORAL_CAPSULE | Freq: Every day | ORAL | Status: DC

## 2012-10-28 MED ORDER — MELOXICAM 7.5 MG PO TABS: 7.5000 mg | ORAL_TABLET | Freq: Every day | ORAL | Status: DC

## 2012-10-28 MED ORDER — HYDROCODONE-ACETAMINOPHEN 10-500 MG PO TABS: 1.0000 | ORAL_TABLET | Freq: Four times a day (QID) | ORAL | Status: DC | PRN

## 2012-10-28 MED ORDER — DICYCLOMINE HCL 20 MG PO TABS: 20.0000 mg | ORAL_TABLET | Freq: Four times a day (QID) | ORAL | Status: DC | PRN

## 2012-10-29 ENCOUNTER — Encounter: Payer: Self-pay | Admitting: Family Medicine

## 2012-10-29 MED ORDER — MORPHINE SULFATE ER 30 MG PO TBCR: 30.0000 mg | EXTENDED_RELEASE_TABLET | Freq: Two times a day (BID) | ORAL | Status: DC

## 2012-10-29 NOTE — Progress Notes (Signed)
  Subjective:    Patient ID: [name], female    DOB: [birth year], 61 y.o.   MRN: [id number]  HPI   Subjective:    Patient ID: [name], female    DOB: 1951-09-23, 61 y.o.   MRN: [id number]  HPI Patient Active Problem List  Diagnosis  . Obesity (BMI 30.0-34.9) Pt has gained weight recently because of resuming higher CHO intake. Plans to work on weight loss again after holidays  . DEPRESSION, MAJOR, RECURRENT Stable mood.  No ahedonia. Recently moved into her own apartment. Followed by Dr Betti Cruz (Psych).  No recent changes in medications.    . IBS Keeping symptoms in check using daily Bentyl, intermittent Procholorperazine.  Probiotic does help her sxs when she can afford to take them, currently she cannot afford the probiotics   . RESTLESS LEGS SYNDROME: Improved since loss of weight, often goes nights without having to use the Vidodin.  . CHRONIC PAIN SYNDROME Improved pain control in hips and knees since increase in MS Contin to 30 mg twice a day and as needed use of Meloxicam.  Pt takes Flexeril as needed for hip muscle spasm. 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  Manages constipation with miralax and fruits.    Marland Kitchen HYPERTENSION, BENIGN SYSTEMIC CHRONIC HYPERTENSION  Disease Monitoring  Blood pressure range: not monitoring at home  Chest pain: no   Dyspnea: no   Claudication: no   Medication compliance: yes  Medication Side Effects  Lightheadedness: no   Urinary frequency: no   Edema: no    Preventitive Healthcare:  Exercise: no   Diet Pattern: Low carbohydrate diet  Salt Restriction: yes, except salts vegetables.    . Esophageal Reflux - Worsens when runs out of omeprazole. Currently under adequate symptom control   . PREDIABETES Working on weight loss  No Smoking Review of SystemsSee HPI     Objective:   Physical Exam  Constitutional: Vital signs are normal. She is  cooperative. No distress.  Cardiovascular: Normal rate, regular rhythm and normal heart sounds.   Pulmonary/Chest: Effort normal and breath sounds normal.  Musculoskeletal: She exhibits no edema.  Psychiatric: She has a normal mood and affect. Her speech is normal and behavior is normal. Thought content normal. Cognition and memory are normal.          Assessment & Plan:    Review of Systems     Objective:   Physical Exam  Constitutional: No distress (obese).       ob  Cardiovascular: Normal rate and regular rhythm.   No murmur heard. Pulmonary/Chest: Effort normal and breath sounds normal.  Psychiatric: She has a normal mood and affect. Her behavior is normal. Thought content normal. Her speech is not rapid and/or pressured, not delayed and not slurred. She is not agitated and not withdrawn. Cognition and memory are normal. She exhibits normal recent memory and normal remote memory.          Assessment & Plan:

## 2012-10-29 NOTE — Assessment & Plan Note (Signed)
Adequate pain control. Able to participate in ADLs, iADLs and work 25 hours a week. No significant adverse effects. No aberrant medication seeking behaviors.  3 handwritten Rxs for MS Contin 30 mg Disp #60 (fill on or after 11/04/12; 12/04/12; 01/03/13) Rx for hydrocodone 10/325 disp#45 fill on or after 12/30/12

## 2012-10-29 NOTE — Assessment & Plan Note (Signed)
No further monitoring follow up unless relevant symptoms/signs develop.

## 2012-10-29 NOTE — Assessment & Plan Note (Signed)
Adequate symptom control. Tolerating medications.  Continue current medications.

## 2012-10-29 NOTE — Assessment & Plan Note (Signed)
Good symptom control in a time of year that has been difficult for Ms [name] in the past. Dr Betti Cruz following and prescribing Oleptro, lorazepam and trazodone. Tolerating medications.

## 2012-10-29 NOTE — Assessment & Plan Note (Signed)
.  Adequate control of symptoms with Vicodin couple times a week at bedtime for flares.  Tolerating medication. Plan to continue current regiment.

## 2012-10-29 NOTE — Assessment & Plan Note (Addendum)
Adequate bp control. Tolerating medications.  No new end-organ damage.  Will decrease the HCTZ to 25 mg daily.  Continue Ramipril 10 mg daily and metoprolol 50 mg twice daily.  Lab Results  Component Value Date   CREATININE 0.87 10/28/2012   BUN 22 10/28/2012   NA 137 10/28/2012   K 4.0 10/28/2012   CL 96 10/28/2012   CO2 29 10/28/2012   Improved serum sodium and potassium.

## 2012-12-09 ENCOUNTER — Encounter: Payer: Self-pay | Admitting: Family Medicine

## 2012-12-09 ENCOUNTER — Ambulatory Visit (INDEPENDENT_AMBULATORY_CARE_PROVIDER_SITE_OTHER): Payer: Federal, State, Local not specified - PPO | Admitting: Family Medicine

## 2012-12-09 VITALS — BP 130/83 | HR 60 | Ht 64.0 in | Wt 217.0 lb

## 2012-12-09 DIAGNOSIS — K929 Disease of digestive system, unspecified: Secondary | ICD-10-CM

## 2012-12-09 DIAGNOSIS — G894 Chronic pain syndrome: Secondary | ICD-10-CM

## 2012-12-09 DIAGNOSIS — E039 Hypothyroidism, unspecified: Secondary | ICD-10-CM

## 2012-12-09 DIAGNOSIS — K319 Disease of stomach and duodenum, unspecified: Secondary | ICD-10-CM

## 2012-12-09 DIAGNOSIS — I10 Essential (primary) hypertension: Secondary | ICD-10-CM

## 2012-12-09 DIAGNOSIS — IMO0001 Reserved for inherently not codable concepts without codable children: Secondary | ICD-10-CM

## 2012-12-09 LAB — TSH: TSH: 2.82 u[IU]/mL (ref 0.350–4.500)

## 2012-12-09 MED ORDER — MORPHINE SULFATE ER 30 MG PO TBCR
30.0000 mg | EXTENDED_RELEASE_TABLET | Freq: Two times a day (BID) | ORAL | Status: DC
Start: 2012-12-19 — End: 2013-06-23

## 2012-12-09 MED ORDER — MORPHINE SULFATE ER 30 MG PO TBCR: 30.0000 mg | EXTENDED_RELEASE_TABLET | Freq: Two times a day (BID) | ORAL | Status: DC

## 2012-12-09 MED ORDER — HYDROCODONE-ACETAMINOPHEN 10-500 MG PO TABS: 45.0000 | ORAL_TABLET | Freq: Four times a day (QID) | ORAL | Status: DC | PRN

## 2012-12-10 ENCOUNTER — Encounter: Payer: Self-pay | Admitting: Family Medicine

## 2012-12-10 NOTE — Assessment & Plan Note (Signed)
Adequate chronic pain control. Able to participate in ADls and iADLs. 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 No significant adverse effect. No aberranf medication seeking behaviors  Monthly refills of MS Contin and Hydrocodone/apap through April 2014 provided to patient. Marland Kitchen

## 2012-12-10 NOTE — Assessment & Plan Note (Signed)
Adequate blood pressure control.  No evidence of new end organ damage.  Tolerating medication without significant adverse effects.  Plan to continue current blood pressure regiment.   

## 2012-12-10 NOTE — Assessment & Plan Note (Signed)
Adequate symptom control. Tolerating medications.  No new end-organ damage.  Continue current medications.

## 2012-12-10 NOTE — Assessment & Plan Note (Signed)
Worsened.  Likely related to financial and transportation difficulties. Pt coping currently with current interventions. Observe for now.

## 2012-12-10 NOTE — Progress Notes (Signed)
  Subjective:    Patient ID: [name], female    DOB: 1951/09/24, 62 y.o.   MRN: [id number]  HPI   Subjective:    Patient ID: [name], female    DOB: 11-20-1950, 62 y.o.   MRN: [id number]  HPI Patient Active Problem List  Diagnosis  . Obesity (BMI 30.0-34.9) Pt has continued to gain weight recently because of resuming higher CHO intake. She has not returned to gym as she had intended. Stressed by problems with car and difficulty with transportation,.  . DEPRESSION, MAJOR, RECURRENT Stable mood.  No ahedonia. Recently moved into her own apartment. Followed by Dr Betti Cruz (Psych).  No recent changes in medications.    . IBS Keeping symptoms in check using daily Bentyl, intermittent Procholorperazine.   No tics, no tremors, no unintended limb movements.   . RESTLESS LEGS SYNDROME: About same. Uses vicodin occasionally at night   . CHRONIC PAIN SYNDROME Was under adeqaute pain control in hips and knees on MS Contin to 30 mg twice a day and as needed use of Meloxicam until stress from her car breaking down.  Pt continues to take Flexeril as needed for hip muscle spasm. 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  Working 15 hours a week.  Manages constipation with miralax and fruits.    Marland Kitchen HYPERTENSION, BENIGN SYSTEMIC CHRONIC HYPERTENSION  Disease Monitoring  Blood pressure range: not monitoring at home  Chest pain: no   Dyspnea: no   Claudication: no   Medication compliance: yes  Medication Side Effects  Lightheadedness: no   Urinary frequency: no   Edema: no    Preventitive Healthcare:  Exercise: no   Diet Pattern: Low carbohydrate diet  Salt Restriction: yes, except salts vegetables.No distress.     . Esophageal Reflux - Currently under adequate symptom control taking omeprazole.  No Smoking Review of SystemsSee HPI     Objective:   Physical Exam  Constitutional: Vital signs are normal.  She is cooperative. Occasionally tearful when talking about stress around her car malfunctioning and financial difficulties but affect brightens when talking about how she plans to solve this issues.  Cardiovascular: Normal rate, regular rhythm and normal heart sounds.   Pulmonary/Chest: Effort normal and breath sounds normal.  Musculoskeletal: She exhibits no edema.  Psychiatric: She has a normal mood and affect. Her speech is normal and behavior is normal. Thought content normal. Cognition and memory are normal.      Assessment & Plan:

## 2012-12-28 ENCOUNTER — Telehealth: Payer: Self-pay | Admitting: Family Medicine

## 2012-12-28 MED ORDER — HYDROCODONE-ACETAMINOPHEN 10-500 MG PO TABS: 1.0000 | ORAL_TABLET | Freq: Four times a day (QID) | ORAL | Status: DC | PRN

## 2012-12-28 NOTE — Telephone Encounter (Signed)
Received call from [facility], Lortab Rx sent in by Dr. McDiarmid says "take 45 tablets PO q6h prn."    Verbal order to change to one tablet PO q6h prn, will change in epic.

## 2013-03-02 ENCOUNTER — Other Ambulatory Visit: Payer: Self-pay | Admitting: Family Medicine

## 2013-03-06 ENCOUNTER — Ambulatory Visit (INDEPENDENT_AMBULATORY_CARE_PROVIDER_SITE_OTHER): Payer: Federal, State, Local not specified - PPO | Admitting: Family Medicine

## 2013-03-06 VITALS — BP 125/82 | HR 72 | Ht 64.0 in | Wt 230.0 lb

## 2013-03-06 DIAGNOSIS — K319 Disease of stomach and duodenum, unspecified: Secondary | ICD-10-CM

## 2013-03-06 DIAGNOSIS — K929 Disease of digestive system, unspecified: Secondary | ICD-10-CM

## 2013-03-06 DIAGNOSIS — G894 Chronic pain syndrome: Secondary | ICD-10-CM

## 2013-03-06 DIAGNOSIS — I10 Essential (primary) hypertension: Secondary | ICD-10-CM

## 2013-03-06 DIAGNOSIS — G2581 Restless legs syndrome: Secondary | ICD-10-CM

## 2013-03-06 MED ORDER — MORPHINE SULFATE ER 30 MG PO TBCR: 30.0000 mg | EXTENDED_RELEASE_TABLET | Freq: Two times a day (BID) | ORAL | Status: DC

## 2013-03-06 MED ORDER — LEVOTHYROXINE SODIUM 50 MCG PO TABS: 150.0000 ug | ORAL_TABLET | Freq: Every day | ORAL | Status: DC

## 2013-03-06 MED ORDER — MORPHINE SULFATE ER 30 MG PO TBCR
30.0000 mg | EXTENDED_RELEASE_TABLET | Freq: Two times a day (BID) | ORAL | Status: DC
Start: 2013-03-06 — End: 2013-03-06

## 2013-03-06 MED ORDER — HYDROCODONE-ACETAMINOPHEN 10-500 MG PO TABS: 1.0000 | ORAL_TABLET | Freq: Four times a day (QID) | ORAL | Status: DC | PRN

## 2013-03-06 NOTE — Patient Instructions (Signed)
Try to get outside each day for at least 20 minutes to get sunlight exposure.   Cover you head with a brimmed hat and wear sunscreen.

## 2013-03-07 ENCOUNTER — Encounter: Payer: Self-pay | Admitting: Family Medicine

## 2013-03-07 MED ORDER — RAMIPRIL 10 MG PO CAPS: 10.0000 mg | ORAL_CAPSULE | Freq: Every day | ORAL | Status: DC

## 2013-03-07 MED ORDER — HYDROCHLOROTHIAZIDE 50 MG PO TABS: 25.0000 mg | ORAL_TABLET | Freq: Every day | ORAL | Status: DC

## 2013-03-07 MED ORDER — OMEPRAZOLE 40 MG PO CPDR: 40.0000 mg | DELAYED_RELEASE_CAPSULE | Freq: Every day | ORAL | Status: DC

## 2013-03-07 MED ORDER — LEVOTHYROXINE SODIUM 50 MCG PO TABS: 150.0000 ug | ORAL_TABLET | Freq: Every day | ORAL | Status: DC

## 2013-03-07 MED ORDER — PROCHLORPERAZINE MALEATE 10 MG PO TABS: 10.0000 mg | ORAL_TABLET | Freq: Four times a day (QID) | ORAL | Status: DC | PRN

## 2013-03-07 MED ORDER — METOPROLOL TARTRATE 50 MG PO TABS: 50.0000 mg | ORAL_TABLET | Freq: Two times a day (BID) | ORAL | Status: DC

## 2013-03-07 NOTE — Assessment & Plan Note (Signed)
Adequate blood pressure control.  No evidence of new end organ damage.  Tolerating medication without significant adverse effects.  Plan to continue current blood pressure regiment.   

## 2013-03-07 NOTE — Assessment & Plan Note (Addendum)
Adequate chronic pain control. Able to participate in ADls and iADLs. 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 No significant adverse effect. No aberranf medication seeking behaviors  Monthly refills of MS Contin and Hydrocodone/apap through August 2014

## 2013-03-07 NOTE — Assessment & Plan Note (Signed)
Adequate symptomatic control. Tolerating symptomatic medications. Continue current medications.

## 2013-03-07 NOTE — Assessment & Plan Note (Signed)
Adequate control of symptoms with Vicodin couple times a week at bedtime for flares.  Tolerating medication. Plan to continue current regiment.

## 2013-03-07 NOTE — Progress Notes (Signed)
  Subjective:    Patient ID: [name], female    DOB: [birth year], 62 y.o.   MRN: [id number]  HPI   Subjective:    Patient ID: [name], female    DOB: 09/28/1951, 62 y.o.   MRN: [id number]  HPI Patient Active Problem List  Diagnosis  . Obesity (BMI 30.0-34.9) Pt has continued to gain weight recently because of resuming higher CHO intake. She has not returned to gym as she had intended. She does have a gym at her apt complex that she may go to. Has new car that has reduced her stress  . DEPRESSION, MAJOR, RECURRENT Stable mood.  No ahedonia. Recently moved into her own apartment. Followed by Dr Betti Cruz (Psych).  No recent changes in medications.    . IBS Keeping symptoms in check using daily Bentyl, intermittent Procholorperazine.   No tics, no tremors, no unintended limb movements.   . RESTLESS LEGS SYNDROME: No change since last visit. Uses vicodin occasionally at night   . CHRONIC PAIN SYNDROME Adequatee pain control in hips and knees on MS Contin to 30 mg twice a day and as needed use of Meloxicam.  Pt  take Flexeril as needed for hip muscle spasm. Using same amount of Vicodan 3 to 4 tablets in a day, primarily at work. .   Continues to control constipation with miralax and fruits.    Marland Kitchen HYPERTENSION, BENIGN SYSTEMIC CHRONIC HYPERTENSION  Disease Monitoring  Blood pressure range: not monitoring at home  Chest pain: no   Dyspnea: no   Claudication: no   Medication compliance: yes  Medication Side Effects  Lightheadedness: no   Urinary frequency: no   Edema: no    Preventitive Healthcare:  Exercise: no   Diet Pattern: Low carbohydrate diet  Salt Restriction: yes, except salts vegetables.No distress.     . Esophageal Reflux - Currently under adequate symptom control taking omeprazole.  No Smoking Review of SystemsSee HPI     Objective:   Physical Exam  Constitutional: Vital signs are normal. She is cooperative. Occasionally tearful when talking about  stress around her car malfunctioning and financial difficulties but affect brightens when talking about how she plans to solve this issues.  Cardiovascular: Normal rate, regular rhythm and normal heart sounds.   Pulmonary/Chest: Effort normal and breath sounds normal.  Musculoskeletal: She exhibits no edema.  Psychiatric: She has a normal mood and affect. Her speech is normal and behavior is normal. Thought content normal. Cognition and memory are normal.      Assessment & Plan:

## 2013-06-23 ENCOUNTER — Encounter: Payer: Self-pay | Admitting: Family Medicine

## 2013-06-23 ENCOUNTER — Ambulatory Visit (INDEPENDENT_AMBULATORY_CARE_PROVIDER_SITE_OTHER): Payer: Federal, State, Local not specified - PPO | Admitting: Family Medicine

## 2013-06-23 VITALS — BP 151/91 | HR 71 | Temp 98.6°F | Ht 64.0 in | Wt 247.0 lb

## 2013-06-23 DIAGNOSIS — E559 Vitamin D deficiency, unspecified: Secondary | ICD-10-CM

## 2013-06-23 DIAGNOSIS — E039 Hypothyroidism, unspecified: Secondary | ICD-10-CM

## 2013-06-23 DIAGNOSIS — K929 Disease of digestive system, unspecified: Secondary | ICD-10-CM

## 2013-06-23 DIAGNOSIS — I10 Essential (primary) hypertension: Secondary | ICD-10-CM

## 2013-06-23 DIAGNOSIS — F339 Major depressive disorder, recurrent, unspecified: Secondary | ICD-10-CM

## 2013-06-23 DIAGNOSIS — K319 Disease of stomach and duodenum, unspecified: Secondary | ICD-10-CM

## 2013-06-23 DIAGNOSIS — K219 Gastro-esophageal reflux disease without esophagitis: Secondary | ICD-10-CM

## 2013-06-23 DIAGNOSIS — G894 Chronic pain syndrome: Secondary | ICD-10-CM

## 2013-06-23 DIAGNOSIS — E8881 Metabolic syndrome: Secondary | ICD-10-CM

## 2013-06-23 DIAGNOSIS — E7800 Pure hypercholesterolemia, unspecified: Secondary | ICD-10-CM

## 2013-06-23 LAB — BASIC METABOLIC PANEL
BUN: 11 mg/dL (ref 6–23)
CO2: 30 mEq/L (ref 19–32)
Chloride: 99 mEq/L (ref 96–112)
Creat: 0.69 mg/dL (ref 0.50–1.10)
Glucose, Bld: 95 mg/dL (ref 70–99)

## 2013-06-23 LAB — LIPID PANEL
Total CHOL/HDL Ratio: 4.1 Ratio
VLDL: 46 mg/dL — ABNORMAL HIGH (ref 0–40)

## 2013-06-23 LAB — POCT GLYCOSYLATED HEMOGLOBIN (HGB A1C): Hemoglobin A1C: 5.7

## 2013-06-23 MED ORDER — HYDROCODONE-ACETAMINOPHEN 10-325 MG PO TABS: 1.0000 | ORAL_TABLET | Freq: Four times a day (QID) | ORAL | Status: DC | PRN

## 2013-06-23 MED ORDER — MORPHINE SULFATE ER 30 MG PO TBCR: 30.0000 mg | EXTENDED_RELEASE_TABLET | Freq: Two times a day (BID) | ORAL | Status: DC

## 2013-06-23 MED ORDER — POTASSIUM CHLORIDE ER 10 MEQ PO TBCR: 40.0000 meq | EXTENDED_RELEASE_TABLET | Freq: Every day | ORAL | Status: DC

## 2013-06-23 NOTE — Patient Instructions (Addendum)
I think you do well on the High protein diet.  I would like to see you lose about 10 pounds.  I believe you could get your blood pressure under control with this amount of weight loss.   If you could get 150 minutes of exercise a week, you will see both weight loss and muscle strength increase.   You will receive the results of your lab results within 2 weeks. If you do not receive results; please call.

## 2013-06-24 ENCOUNTER — Encounter: Payer: Self-pay | Admitting: Family Medicine

## 2013-06-24 NOTE — Assessment & Plan Note (Signed)
Adequate symptom control. Dr Betti Cruz following and prescribing Oleptro, lorazepam and trazodone. Tolerating medications.

## 2013-06-24 NOTE — Assessment & Plan Note (Signed)
Adequate blood pressure control.  No evidence of new end organ damage.  Tolerating medication without significant adverse effects.  Plan to continue current blood pressure regiment.   

## 2013-06-24 NOTE — Assessment & Plan Note (Signed)
.  adequate symptom control.  Tolerating PPI.  Continue current therapy.

## 2013-06-24 NOTE — Assessment & Plan Note (Signed)
Adequate chronic pain control. Able to participate in ADls and iADLs. 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  No significant adverse effect.  No aberranf medication seeking behaviors  Monthly refills of MS Contin (4 fills of prescription) Hydrocodone/APAP 10/325  Disp#45 with Refill x 3

## 2013-06-24 NOTE — Progress Notes (Signed)
  Subjective:    Patient ID: [name], female    DOB: 1951-06-08, 62 y.o.   MRN: [id number]  HPI   Subjective:    Patient ID: [name], female    DOB: [birth year], 62 y.o.   MRN: [id number]  HPI Patient Active Problem List  Diagnosis  . Obesity (BMI 30.0-34.9) Pt has continued to gain weight recently because of resuming higher CHO intake. She has not returned to gym as she had intended.   Marland Kitchen DEPRESSION, MAJOR, RECURRENT Stable mood.  No ahedonia.  Followed by Dr Betti Cruz (Psych).  No recent changes in medications.    . IBS Keeping symptoms in check using daily Bentyl, uses Procholorperazine about once a da.   No tics, no tremors, no unintended limb movements.   . RESTLESS LEGS SYNDROME: No change since last visit. Uses vicodin occasionally at night   . CHRONIC PAIN SYNDROME Adequatee pain control in hips and knees on MS Contin to 30 mg twice a day and as needed use of Meloxicam.  Pt  take Flexeril as needed for hip muscle spasm. Using same amount of Vicodan 3 to 4 tablets in a day, primarily at work. .   Continues to control constipation with miralax and fruits.    Marland Kitchen HYPERTENSION, BENIGN SYSTEMIC CHRONIC HYPERTENSION  Disease Monitoring  Blood pressure range: not monitoring at home  Chest pain: no   Dyspnea: no   Claudication: no   Medication compliance: yes  Medication Side Effects  Lightheadedness: no   Urinary frequency: no   Edema: no    Preventitive Healthcare:  Exercise: no   Diet Pattern: Low carbohydrate diet  Salt Restriction: yes, except salts vegetables.No distress.     . Esophageal Reflux - Currently under adequate symptom control taking omeprazole.  No Smoking Review of SystemsSee HPI     Objective:   Physical Exam  Constitutional: Vital signs are normal. She is cooperative. Occasionally tearful when talking about stress around her car malfunctioning and financial difficulties but affect brightens when talking about how she plans to solve this  issues.  Cardiovascular: Normal rate, regular rhythm and normal heart sounds.   Pulmonary/Chest: Effort normal and breath sounds normal.  Musculoskeletal: She exhibits no edema.  Psychiatric: She has a normal mood and affect. Her speech is normal and behavior is normal. Thought content normal. Cognition and memory are normal.      Assessment & Plan:

## 2013-06-24 NOTE — Assessment & Plan Note (Signed)
Results for [name], [name] (MRN [id number]) as of 06/24/2013 14:39  Ref. Range 06/23/2013 14:46  Vit D, 25-Hydroxy Latest Range: 30-89 ng/mL 29 (L)   Increase Vitamin D intake to 2000 Units daily.

## 2013-06-24 NOTE — Assessment & Plan Note (Signed)
Lab Results  Component Value Date   TSH 1.293 06/23/2013   Adequate levels of levothyroxine.  Continue 150 mg daily of levothyroxine on empty stomach without other medications.

## 2013-06-24 NOTE — Assessment & Plan Note (Signed)
Adequate symptomatic control. Tolerating symptomatic medications. Continue current medications.

## 2013-06-25 ENCOUNTER — Other Ambulatory Visit: Payer: Self-pay

## 2013-06-25 DIAGNOSIS — Z1231 Encounter for screening mammogram for malignant neoplasm of breast: Secondary | ICD-10-CM

## 2013-07-10 ENCOUNTER — Ambulatory Visit (INDEPENDENT_AMBULATORY_CARE_PROVIDER_SITE_OTHER): Payer: Federal, State, Local not specified - PPO | Admitting: Sports Medicine

## 2013-07-10 VITALS — BP 153/90 | HR 83 | Temp 98.4°F | Ht 64.0 in | Wt 246.0 lb

## 2013-07-10 DIAGNOSIS — G894 Chronic pain syndrome: Secondary | ICD-10-CM

## 2013-07-10 DIAGNOSIS — IMO0001 Reserved for inherently not codable concepts without codable children: Secondary | ICD-10-CM

## 2013-07-10 MED ORDER — KETOROLAC TROMETHAMINE 30 MG/ML IM SOLN: 30.0000 mg | Freq: Once | INTRAMUSCULAR | Status: DC

## 2013-07-10 NOTE — Assessment & Plan Note (Signed)
Given significant intolerance to steroids and difficulty with high-dose by mouth anti-inflammatories single does of 30 mg port all given in office today.  Encouraged her to the on a regular basis.  She is to continue taking her 7.5 mg Mobic for the next 3 days with food.  She has no history of upper GI bleed but I cautioned her on the symptoms.  Resume chronic pain medication as designated by Dr. Perley Jain

## 2013-07-10 NOTE — Patient Instructions (Signed)
It was nice to meet you today  Bleed for your chronic pain syndrome due to missing her medicine and having a fall.  I am not concerned about any specific trauma.  I'm going to give you a shot of Toradol today that'll hopefully make you feel better but I do think that taking your meloxicam and will be helpful over the next 3-5 days.  Be sure to take this with food daily.  Here are some basic Activity rules to remember: Look for exercise opportunities in your day:  Never lie down when you can sit; never sit when you can stand; never stand when you can pace.  Moving your body throughout the day is just as important as the 30 or 60 minutes of exercise at the gym!

## 2013-07-10 NOTE — Progress Notes (Signed)
  [facility]  Patient name: [name] MRN [id number]  Date of birth: 1951/10/15  CC & HPI:  [name] is a 62 y.o. female presenting to clinic.  Chief Complaint  Patient presents with  . Joint Swelling    PAIN   patient presents today with a flare of her chronic pain syndrome.  She has a history of fibromyalgia And reports that on Tuesday she had a significant worsening after she missed a dose of her MS Contin.  She also reports that on Tuesday she had a small all were she tripped over a small ledge on a porch and gently landed on her left side.  She reports she is having worsening pain on her right side worse than her left but it is present on both sides in her ankles, knees, hips, thighs, calfs, and  Shoulders.  She denies any specific concerns of trauma.  No bruising, no LOC.  She has been taking meloxicam 7.5 mg starting today and is back on her chronic pain medication.  She is unable to take any steroids because of worsening depression and questionable psychosis.  She has not been active recently but previously was trying to walk on a treadmill a couple of times per week.  ROS:  PER HPI  Pertinent History Reviewed:  Medical & Surgical Hx:  Reviewed: Significant for complex medical history per problem list Medications: Reviewed & Updated - see associated section Social History: Reviewed -  reports that she quit smoking about 5 years ago. Her smoking use included Cigarettes. She has a 66 pack-year smoking history. She quit smokeless tobacco use about 5 years ago.  Objective Findings:  Vitals: BP 153/90  Pulse 83  Temp(Src) 98.4 F (36.9 C) (Oral)  Ht 5\' 4"  (1.626 m)  Wt 246 lb (111.585 kg)  BMI 42.21 kg/m2 PE: GENERAL:  adult obese Caucasian female. In no discomfort; no respiratory distress  PSYCH:  alert and appropriate, good insight , pleasantly interactive   HNEENT:    CARDIO:  RRR, S1/S2 heard, no murmur  LUNGS:  CTA B, no wheezes, no crackles   ABDOMEN:    EXTREM:  she has diffuse tenderness to palpation including majority of the fibromyalgia trigger points.  There is no point tenderness over her left hip, left knee.  She reports press on her left knee it makes her right knee her.  There is no specific joint line tenderness or effusion of her knees or ankles.  Bilateral negative FADIR.  Capillary refill is good     Assessment & Plan:  No diagnosis found. See problem associated charting

## 2013-07-10 NOTE — Assessment & Plan Note (Signed)
Patient has a likely flareup of her chronic pain syndrome.  There many aspects of her pain that not entirely convinced are nociceptive in nature.  Is a greater than 50% of its 25 minute visit and direct counseling the patient regarding exercise as medicine.  She was encouraged to try to increase her activity to 30 minutes at least 5 times per week.  Her reassured her that although this may acutely cause her to be sore and in pain she is not causing long-term damage by doing this activity.  I encouraged aqua therapy as often as possible but that structured aerobic exercise should be a part of her daily routine.

## 2013-07-17 ENCOUNTER — Ambulatory Visit
Admission: RE | Admit: 2013-07-17 | Discharge: 2013-07-17 | Disposition: A | Discharge status: Home / Self Care | Payer: Federal, State, Local not specified - PPO | Source: Ambulatory Visit

## 2013-07-17 DIAGNOSIS — Z1231 Encounter for screening mammogram for malignant neoplasm of breast: Secondary | ICD-10-CM

## 2013-07-23 ENCOUNTER — Encounter: Payer: Self-pay | Admitting: Family Medicine

## 2013-07-23 ENCOUNTER — Ambulatory Visit (INDEPENDENT_AMBULATORY_CARE_PROVIDER_SITE_OTHER): Payer: Federal, State, Local not specified - PPO | Admitting: Family Medicine

## 2013-07-23 VITALS — BP 155/93 | HR 60 | Temp 98.1°F | Wt 244.0 lb

## 2013-07-23 DIAGNOSIS — M25521 Pain in right elbow: Secondary | ICD-10-CM

## 2013-07-23 DIAGNOSIS — M25529 Pain in unspecified elbow: Secondary | ICD-10-CM

## 2013-07-23 DIAGNOSIS — M25522 Pain in left elbow: Secondary | ICD-10-CM

## 2013-07-23 NOTE — Progress Notes (Signed)
Subjective:     Patient ID: [name], female   DOB: 05-Aug-1951, 62 y.o.   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 Has been doing this for years adn doesn't want to give it up - has known arthritis and fibromyalgia - noticed about a month ago that she started having medial wrist pain - seem to be worse when she is at work and pulling things down the conveyer belt - worse with cold weather - wears braces and they seem to help  = pain is just proximal to the wrist  - hx of bilateral carpal tunnel release surgery  - wondering if this is carpal tunnel coming back again. If so, she wonders if she should quit her job to keep it from worsening.   Not tingling. No fevers, chills, nausea, vomiting. Numbness, tingling,    Review of Systems See above     Objective:   Physical Exam GEN: NAD EXT: pain and tenderness with palpation of the distal portion of the medial forearm along the tract of the flexor carpi ulnaris tendon. Slight swelling in the same region bilaterally but worse on the right.  Full wrist ROM without significant pain.  Pain with supination and pronation of the forearm. No pain with flexion or extension of the elbow. Same exam on both sides although right is more pronounced.  CV: 2+ radial pulses Neuro: sensation intact, neg phalen's and tinels    Assessment:     Pain in joint, upper arm, left  Pain in joint, upper arm, right       Plan:     - pain and exam consistent with a likely overuse injury and strain of the muscles of the medial forearm. No indication that this is carpal tunnel returning.   - discussed options with pt including xray- although I doubt this problem is localized to the wrist given location on exam - will hold off on xrays now - recommend as much as possible resting from the motions that hurt - exercises  for medial epicondylitis given as I believe these will help strenghten the muscles involved - cont heat and bracing as a reminder not to overuse.  - cont meloxicam - if no improvement, advised to let us know.    Of note, BP slightly elevated but pt hasn't taken her bp meds today. Advised her to take them and would recommend she make an appt with PCP to further discuss.

## 2013-07-23 NOTE — Patient Instructions (Signed)
Medial Epicondylitis (Golfer's Elbow)  with Rehab  Medial epicondylitis involves inflammation and pain around the inner (medial) portion of the elbow. This pain is caused by inflammation of the tendons in the forearm that flex (bring down) the wrist. Medial epicondylitis is also called golfer's elbow, because it is common among golfers. However, it may occur in any individual who flexes the wrist regularly. If medial epicondylitis is left untreated, it may become a chronic problem.  SYMPTOMS   · Pain, tenderness, or inflammation over the inner (medial) side of the elbow.  · Pain or weakness with gripping activities.  · Pain that increases with wrist twisting motions (using a screwdriver, playing golf, bowling).  CAUSES   Medial epicondylitis is caused by inflammation of the tendons that flex the wrist. Causes of injury may include:  · Chronic, repetitive stress and strain to the tendons that run from the wrist and forearm to the elbow.  · Sudden strain on the forearm, including wrist snap when serving balls with racquet sports, or throwing a baseball.  RISK INCREASES WITH:  · Sports or occupations that require repetitive and/or strenuous forearm and wrist movements (pitching a baseball, golfing, carpentry).  · Poor wrist and forearm strength and flexibility.  · Failure to warm up properly before activity.  · Resuming activity before healing, rehabilitation, and conditioning are complete.  PREVENTION   · Warm up and stretch properly before activity.  · Maintain physical fitness:  · Strength, flexibility, and endurance.  · Cardiovascular fitness.  · Wear and use properly fitted equipment.  · Learn and use proper technique and have a coach correct improper technique.  · Wear a tennis elbow (counterforce) brace.  PROGNOSIS   The course of this condition depends on the degree of the injury. If treated properly, acute cases (symptoms lasting less than 4 weeks) are often resolved in 2 to 6 weeks. Chronic (longer lasting  cases) often resolve in 3 to 6 months, but may require physical therapy.  RELATED COMPLICATIONS   · Frequently recurring symptoms, resulting in a chronic problem. Properly treating the problem the first time decreases frequency of recurrence.  · Chronic inflammation, scarring, and partial tendon tear, requiring surgery.  · Delayed healing or resolution of symptoms.  TREATMENT   Treatment first involves the use of ice and medicine, to reduce pain and inflammation. Strengthening and stretching exercises may reduce discomfort, if performed regularly. These exercises may be performed at home, if the condition is an acute injury. Chronic cases may require a referral to a physical therapist for evaluation and treatment. Your caregiver may advise a corticosteroid injection to help reduce inflammation. Rarely, surgery is needed.  MEDICATION  · If pain medicine is needed, nonsteroidal anti-inflammatory medicines (aspirin and ibuprofen), or other minor pain relievers (acetaminophen), are often advised.  · Do not take pain medicine for 7 days before surgery.  · Prescription pain relievers may be given, if your caregiver thinks they are needed. Use only as directed and only as much as you need.  · Corticosteroid injections may be recommended. These injections should be reserved only for the most severe cases, because they can only be given a certain number of times.  HEAT AND COLD  · Cold treatment (icing) should be applied for 10 to 15 minutes every 2 to 3 hours for inflammation and pain, and immediately after activity that aggravates your symptoms. Use ice packs or an ice massage.  · Heat treatment may be used before performing stretching and strengthening activities   prescribed by your caregiver, physical therapist, or athletic trainer. Use a heat pack or a warm water soak.  SEEK MEDICAL CARE IF:  Symptoms get worse or do not improve in 2 weeks, despite treatment.  EXERCISES   RANGE OF MOTION (ROM) AND STRETCHING EXERCISES -  Epicondylitis, Medial (Golfer's Elbow)  These exercises may help you when beginning to rehabilitate your injury. Your symptoms may go away with or without further involvement from your physician, physical therapist or athletic trainer. While completing these exercises, remember:   · Restoring tissue flexibility helps normal motion to return to the joints. This allows healthier, less painful movement and activity.  · An effective stretch should be held for at least 30 seconds.  · A stretch should never be painful. You should only feel a gentle lengthening or release in the stretched tissue.  RANGE OF MOTION  Wrist Flexion, Active-Assisted  · Extend your right / left elbow with your fingers pointing down.*  · Gently pull the back of your hand towards you, until you feel a gentle stretch on the top of your forearm.  · Hold this position for __________ seconds.  Repeat __________ times. Complete this exercise __________ times per day.   *If directed by your physician, physical therapist or athletic trainer, complete this stretch with your elbow bent, rather than extended.  RANGE OF MOTION  Wrist Extension, Active-Assisted  · Extend your right / left elbow and turn your palm upwards.*  · Gently pull your palm and fingertips back, so your wrist extends and your fingers point more toward the ground.  · You should feel a gentle stretch on the inside of your forearm.  · Hold this position for __________ seconds.  Repeat __________ times. Complete this exercise __________ times per day.  *If directed by your physician, physical therapist or athletic trainer, complete this stretch with your elbow bent, rather than extended.  STRETCH  Wrist Extension   · Place your right / left fingertips on a tabletop leaving your elbow slightly bent. Your fingers should point backwards.  · Gently press your fingers and palm down onto the table, by straightening your elbow. You should feel a stretch on the inside of your forearm.  · Hold this  position for __________ seconds.  Repeat __________ times. Complete this stretch __________ times per day.   STRENGTHENING EXERCISES - Epicondylitis, Medial (Golfer's Elbow)  These exercises may help you when beginning to rehabilitate your injury. They may resolve your symptoms with or without further involvement from your physician, physical therapist or athletic trainer. While completing these exercises, remember:   · Muscles can gain both the endurance and the strength needed for everyday activities through controlled exercises.  · Complete these exercises as instructed by your physician, physical therapist or athletic trainer. Increase the resistance and repetitions only as guided.  · You may experience muscle soreness or fatigue, but the pain or discomfort you are trying to eliminate should never worsen during these exercises. If this pain does get worse, stop and make sure you are following the directions exactly. If the pain is still present after adjustments, discontinue the exercise until you can discuss the trouble with your caregiver.  STRENGTH Wrist Flexors  · Sit with your right / left forearm palm-up, and fully supported on a table or countertop. Your elbow should be resting below the height of your shoulder. Allow your wrist to extend over the edge of the surface.  · Loosely holding a __________ weight, or a piece   of rubber exercise band or tubing, slowly curl your hand up toward your forearm.  · Hold this position for __________ seconds. Slowly lower the wrist back to the starting position in a controlled manner.  Repeat __________ times. Complete this exercise __________ times per day.   STRENGTH  Wrist Extensors  · Sit with your right / left forearm palm-down and fully supported. Your elbow should be resting below the height of your shoulder. Allow your wrist to extend over the edge of the surface.  · Loosely holding a __________ weight, or a piece of rubber exercise band or tubing, slowly curl  your hand up toward your forearm.  · Hold this position for __________ seconds. Slowly lower the wrist back to the starting position in a controlled manner.  Repeat __________ times. Complete this exercise __________ times per day.   STRENGTH - Ulnar Deviators  · Stand with a ____________________ weight in your right / left hand, or sit while holding a rubber exercise band or tubing, with your healthy arm supported on a table or countertop.  · Move your wrist so that your pinkie travels toward your forearm and your thumb moves away from your forearm.  · Hold this position for __________ seconds and then slowly lower the wrist back to the starting position.  Repeat __________ times. Complete this exercise __________ times per day  STRENGTH - Grip   · Grasp a tennis ball, a dense sponge, or a large, rolled sock in your hand.  · Squeeze as hard as you can, without increasing any pain.  · Hold this position for __________ seconds. Release your grip slowly.  Repeat __________ times. Complete this exercise __________ times per day.   STRENGTH  Forearm Supinators   · Sit with your right / left forearm supported on a table, keeping your elbow below shoulder height. Rest your hand over the edge, palm down.  · Gently grip a hammer or a soup ladle.  · Without moving your elbow, slowly turn your palm and hand upward to a "thumbs-up" position.  · Hold this position for __________ seconds. Slowly return to the starting position.  Repeat __________ times. Complete this exercise __________ times per day.   STRENGTH  Forearm Pronators  · Sit with your right / left forearm supported on a table, keeping your elbow below shoulder height. Rest your hand over the edge, palm up.  · Gently grip a hammer or a soup ladle.  · Without moving your elbow, slowly turn your palm and hand upward to a "thumbs-up" position.  · Hold this position for __________ seconds. Slowly return to the starting position.  Repeat __________ times. Complete this  exercise __________ times per day.   Document Released: 10/23/2005 Document Revised: 01/15/2012 Document Reviewed: 02/04/2009  [employer]® Patient Information ©2014 [employer].

## 2013-08-05 ENCOUNTER — Telehealth: Payer: Self-pay | Admitting: Family Medicine

## 2013-08-05 MED ORDER — HYDROCODONE-ACETAMINOPHEN 10-325 MG PO TABS: 1.0000 | ORAL_TABLET | Freq: Four times a day (QID) | ORAL | Status: DC | PRN

## 2013-08-05 NOTE — Telephone Encounter (Signed)
Patient needs to bring her rx by and leave it and pick up her new one  Thanks  LC

## 2013-08-05 NOTE — Telephone Encounter (Signed)
Spoke with [facility] and found out that when guidelines for narcotics changes on 08-11-13, all medication rx's will be null and void in their system.  This is why pt needed a new paper rx.  Left message that rx is ready at front desk for pick up with pt.  [name],CMA

## 2013-08-05 NOTE — Telephone Encounter (Signed)
Spoke patient and she was told by [facility] that she will need a new written rx to get her refill due 08-23-13.  She states that she has 2 pending on her current rx but will not be able to pick them up before the new guidelines change.  Please advise.  Rodina Pinales,CMA

## 2013-08-05 NOTE — Telephone Encounter (Signed)
Needs refill on hydo codone Will come by to get prescription when ready Please call

## 2013-09-11 ENCOUNTER — Other Ambulatory Visit: Payer: Self-pay

## 2013-09-22 ENCOUNTER — Telehealth: Payer: Self-pay | Admitting: Family Medicine

## 2013-09-22 NOTE — Telephone Encounter (Signed)
Pt called and would like a refill on her hydrocodone left up front for pickup. jw °

## 2013-09-24 MED ORDER — HYDROCODONE-ACETAMINOPHEN 10-325 MG PO TABS: 1.0000 | ORAL_TABLET | Freq: Four times a day (QID) | ORAL | Status: DC | PRN

## 2013-09-24 NOTE — Telephone Encounter (Signed)
Written for 45 Rx given to [employer]

## 2013-09-24 NOTE — Telephone Encounter (Signed)
Pt is aware and states that she will pick up tomorrow.  [name],CMA

## 2013-10-10 ENCOUNTER — Telehealth: Payer: Self-pay | Admitting: Family Medicine

## 2013-10-10 ENCOUNTER — Encounter (HOSPITAL_COMMUNITY): Payer: Self-pay | Admitting: Emergency Medicine

## 2013-10-10 ENCOUNTER — Emergency Department (HOSPITAL_COMMUNITY)
Admission: EM | Admit: 2013-10-10 | Discharge: 2013-10-11 | Disposition: A | Discharge status: Home / Self Care | Payer: Federal, State, Local not specified - PPO | Attending: Emergency Medicine | Admitting: Emergency Medicine

## 2013-10-10 ENCOUNTER — Telehealth: Payer: Self-pay | Admitting: Emergency Medicine

## 2013-10-10 DIAGNOSIS — Z8669 Personal history of other diseases of the nervous system and sense organs: Secondary | ICD-10-CM | POA: Insufficient documentation

## 2013-10-10 DIAGNOSIS — Z860100 Personal history of colon polyps, unspecified: Secondary | ICD-10-CM | POA: Insufficient documentation

## 2013-10-10 DIAGNOSIS — Z79899 Other long term (current) drug therapy: Secondary | ICD-10-CM | POA: Insufficient documentation

## 2013-10-10 DIAGNOSIS — R519 Headache, unspecified: Secondary | ICD-10-CM

## 2013-10-10 DIAGNOSIS — G8929 Other chronic pain: Secondary | ICD-10-CM | POA: Insufficient documentation

## 2013-10-10 DIAGNOSIS — Z7982 Long term (current) use of aspirin: Secondary | ICD-10-CM | POA: Insufficient documentation

## 2013-10-10 DIAGNOSIS — E039 Hypothyroidism, unspecified: Secondary | ICD-10-CM | POA: Insufficient documentation

## 2013-10-10 DIAGNOSIS — Z872 Personal history of diseases of the skin and subcutaneous tissue: Secondary | ICD-10-CM | POA: Insufficient documentation

## 2013-10-10 DIAGNOSIS — I10 Essential (primary) hypertension: Secondary | ICD-10-CM

## 2013-10-10 DIAGNOSIS — Z8614 Personal history of Methicillin resistant Staphylococcus aureus infection: Secondary | ICD-10-CM | POA: Insufficient documentation

## 2013-10-10 DIAGNOSIS — F411 Generalized anxiety disorder: Secondary | ICD-10-CM | POA: Insufficient documentation

## 2013-10-10 DIAGNOSIS — Z87891 Personal history of nicotine dependence: Secondary | ICD-10-CM | POA: Insufficient documentation

## 2013-10-10 DIAGNOSIS — R51 Headache: Secondary | ICD-10-CM | POA: Insufficient documentation

## 2013-10-10 DIAGNOSIS — Z8719 Personal history of other diseases of the digestive system: Secondary | ICD-10-CM | POA: Insufficient documentation

## 2013-10-10 LAB — CBC WITH DIFFERENTIAL/PLATELET
Eosinophils Relative: 1 % (ref 0–5)
HCT: 40 % (ref 36.0–46.0)
Lymphocytes Relative: 29 % (ref 12–46)
Lymphs Abs: 2.8 10*3/uL (ref 0.7–4.0)
MCV: 88.5 fL (ref 78.0–100.0)
Neutro Abs: 6 10*3/uL (ref 1.7–7.7)
Platelets: 246 10*3/uL (ref 150–400)
RBC: 4.52 MIL/uL (ref 3.87–5.11)
WBC: 9.7 10*3/uL (ref 4.0–10.5)

## 2013-10-10 LAB — COMPREHENSIVE METABOLIC PANEL
ALT: 22 U/L (ref 0–35)
AST: 22 U/L (ref 0–37)
Alkaline Phosphatase: 62 U/L (ref 39–117)
CO2: 29 mEq/L (ref 19–32)
Calcium: 9.8 mg/dL (ref 8.4–10.5)
Chloride: 95 mEq/L — ABNORMAL LOW (ref 96–112)
GFR calc Af Amer: >90 mL/min (ref >90)
GFR calc non Af Amer: >90 mL/min (ref >90)
Glucose, Bld: 99 mg/dL (ref 70–99)
Sodium: 134 mEq/L — ABNORMAL LOW (ref 135–145)
Total Bilirubin: 0.4 mg/dL (ref 0.3–1.2)

## 2013-10-10 MED ORDER — MAGNESIUM SULFATE 40 MG/ML IJ SOLN
2.0000 g | Freq: Once | INTRAMUSCULAR | Status: AC
  Administered 2013-10-10: 2 g via INTRAVENOUS
  Filled 2013-10-10: qty 50

## 2013-10-10 MED ORDER — SODIUM CHLORIDE 0.9 % IV SOLN
INTRAVENOUS | Status: DC
  Administered 2013-10-11: via INTRAVENOUS

## 2013-10-10 NOTE — Telephone Encounter (Signed)
Emergency Line Call Patient called the emergency line regarding her blood pressure.  It has been running in the 170s-180s/100s since 4:30pm this afternoon.  She has taken all her BP meds as prescribed (HCTZ, metoprolol, and ramipril).  She did have a headache this afternoon and this responded to tylenol.  There is no confusion or focal neurologic deficit.  Given that it is a Friday night, I recommended that she go the ER to be evaluated for her hypertension.  She expressed understanding and agreement and will go the ER.   Gibson Lad 10/10/2013, 6:56 PM

## 2013-10-10 NOTE — ED Provider Notes (Signed)
CSN: [id number]     Arrival date & time 10/10/13  1924 History   First MD Initiated Contact with Patient 10/10/13 2317     Chief Complaint  Patient presents with  . Headache  . Hypertension   (Consider location/radiation/quality/duration/timing/severity/associated sxs/prior Treatment) HPI History provided by patient. Has history of anxiety, hypertension, chronic pain. She came home from work this afternoon with a mild to moderate headache, located bitemporal, dull in quality without radiation. No associated fever, vomiting, neck pain or neck stiffness. She checked her blood pressure and noted it to be in the range of systolic 180-220. She took 2 Tylenol and called her primary care physician who recommended that she come to the emergency department. She denies any new weakness, numbness, difficulty with gait or difficulty with speech. She took her hypertension medications as prescribed. She is very anxious tonight because her on recently had a stroke and passed away.  She denies any sudden onset or thunderclap headache. She does get regular headaches and they usually go away with Tylenol. Patient also took her scheduled morphine 30 mg and hydrocodone prior to arrival. Currently her headache is 6/10.  Past Medical History  Diagnosis Date  . History of MRSA infection 04/28/2011  . Solitary lung nodule 04/08/2011  . Hepatic steatosis 04/12/2011  . SIGMOID POLYP 01/23/2008  . HYPOTHYROIDISM, BORDERLINE 02/15/2007  . VITAMIN D DEFICIENCY 03/03/2010  . LACTOSE INTOLERANCE 03/10/2008  . HYPERCHOLESTEROLEMIA 12/24/2008  . Hypertension   . Urticaria, chronic 05/22/2011  . Carpal tunnel syndrome 02/14/2007    S/P carpel tunnel release bilaterally.    . Hypothyroidism 02/15/2007    Qualifier: Diagnosis of  By: McDiarmid MD, Tawanna Cooler     Past Surgical History  Procedure Laterality Date  . Bunionectomy  1992    bilateral feet with pins in great toes  . Bladder tack  1998    Dr Annabell Howells (urology)  . Laparoscopy for  ectopic pregnancy    . Colonoscopy  2004    Dr Arty Baumgartner (GI)  . Cystocele repair  1998     Dr Huntley Dec (GYN)-  . Rectocele repair  1998    Dr Huntley Dec (GYN)  . Esophagogastroduodenoscopy    . Injection hip intra articular  2008    Left Hip fluoroscopically-guided corticosteorid injection for painful osteoarthritis with good effect (06/2007)  . Exploratory laparotomy    . Facial lacerations repair      Facial Laceration repair as adolescent   . Nm myoview ltd  2011    Dr Verdis Prime, III (Card)  . Carpal tunnel release  2010    Bilateral, Dr Josephine Igo   Family History  Problem Relation Age of Onset  . Diabetes type II    . Hypertension    . Heart attack    . Alcohol abuse    . Depression    . Asthma    . Migraines     History  Substance Use Topics  . Smoking status: Former Smoker -- 1.50 packs/day for 44 years    Types: Cigarettes    Quit date: 04/06/2008  . Smokeless tobacco: Former Neurosurgeon    Quit date: 04/06/2008  . Alcohol Use: No   OB History   Grav Para Term Preterm Abortions TAB SAB Ect Mult Living                 Review of Systems  Constitutional: Negative for fever and chills.  Eyes: Negative for pain and visual disturbance.  Respiratory: Negative for shortness  of breath.   Cardiovascular: Negative for chest pain.  Gastrointestinal: Negative for abdominal pain.  Genitourinary: Negative for hematuria and difficulty urinating.  Musculoskeletal: Negative for back pain, neck pain and neck stiffness.  Skin: Negative for rash.  Neurological: Positive for headaches. Negative for syncope.  All other systems reviewed and are negative.    Allergies  Aripiprazole; Diclofenac-misoprostol; Emsam; Estradiol; Ibuprofen; Naproxen; Neurontin; Paroxetine; Prednisone; and Tape  Home Medications   Current Outpatient Rx  Name  Route  Sig  Dispense  Refill  . acetaminophen (TYLENOL) 500 MG tablet   Oral   Take 1,000 mg by mouth 2 (two) times daily as needed (pain).           Marland Kitchen aspirin EC 81 MG tablet   Oral   Take 81 mg by mouth at bedtime.         . Calcium Carbonate-Vitamin D (CALCIUM 600+D) 600-400 MG-UNIT per tablet   Oral   Take 1 tablet by mouth daily.          . cyclobenzaprine (FLEXERIL) 10 MG tablet   Oral   Take 10 mg by mouth at bedtime as needed for muscle spasms.         Marland Kitchen dicyclomine (BENTYL) 20 MG tablet   Oral   Take 20 mg by mouth 3 (three) times daily.         . fexofenadine (ALLEGRA) 180 MG tablet   Oral   Take 180 mg by mouth daily.         . hydrochlorothiazide (HYDRODIURIL) 50 MG tablet   Oral   Take 0.5 tablets (25 mg total) by mouth daily.   45 tablet   PRN   . HYDROcodone-acetaminophen (NORCO) 10-325 MG per tablet   Oral   Take 1 tablet by mouth 2 (two) times daily as needed (pain).         . hydrOXYzine (ATARAX/VISTARIL) 50 MG tablet   Oral   Take 150 mg by mouth at bedtime. May take one additional  tablet  a day as needed for hives         . levothyroxine (SYNTHROID, LEVOTHROID) 50 MCG tablet   Oral   Take 150 mcg by mouth daily before breakfast.         . LORazepam (ATIVAN) 1 MG tablet   Oral   Take 1 mg by mouth 3 (three) times daily. For anxiety         . metoprolol (LOPRESSOR) 50 MG tablet   Oral   Take 1 tablet (50 mg total) by mouth 2 (two) times daily.   180 tablet   3   . morphine (MS CONTIN) 30 MG 12 hr tablet   Oral   Take 1 tablet (30 mg total) by mouth 2 (two) times daily.   60 tablet   0   . Multiple Vitamin (MULTIVITAMIN WITH MINERALS) TABS tablet   Oral   Take 1 tablet by mouth daily.         . nitroGLYCERIN (NITROSTAT) 0.4 MG SL tablet   Sublingual   Place 0.4 mg under the tongue every 5 (five) minutes as needed for chest pain.         Marland Kitchen omeprazole (PRILOSEC) 40 MG capsule   Oral   Take 1 capsule (40 mg total) by mouth daily.   90 capsule   3   . polyethylene glycol (MIRALAX) powder   Oral   Take 17 g by mouth every other day.          Marland Kitchen  Polyvinyl Alcohol-Povidone (REFRESH OP)   Both Eyes   Place 1-2 drops into both eyes daily as needed (dry eyes).         . potassium chloride (K-DUR) 10 MEQ tablet   Oral   Take 4 tablets (40 mEq total) by mouth at bedtime.   120 tablet   5   . Probiotic Product (PROBIOTIC PO)   Oral   Take 1 tablet by mouth daily.         . prochlorperazine (COMPAZINE) 10 MG tablet   Oral   Take 10 mg by mouth 2 (two) times daily as needed for nausea or vomiting.         . ramipril (ALTACE) 10 MG capsule   Oral   Take 1 capsule (10 mg total) by mouth daily.   90 capsule   3   . simethicone (MYLICON) 125 MG chewable tablet   Oral   Chew 125 mg by mouth every 6 (six) hours as needed for flatulence.         . TraZODone HCl (OLEPTRO) 300 MG TB24   Oral   Take 300 mg by mouth at bedtime.          . vitamin B-12 (CYANOCOBALAMIN) 1000 MCG tablet   Oral   Take 1,000 mcg by mouth daily.          BP 182/70  Pulse 62  Temp(Src) 97.9 F (36.6 C) (Oral)  Resp 20  Ht 5\' 4"  (1.626 m)  Wt 238 lb 3.2 oz (108.047 kg)  BMI 40.87 kg/m2  SpO2 96% Physical Exam  Constitutional: She is oriented to person, place, and time. She appears well-developed and well-nourished.  HENT:  Head: Normocephalic and atraumatic.  Eyes: Conjunctivae and EOM are normal.  L pupil 4mm, R pupil 5mm, both reactive, baseline per PT  Neck: Full passive range of motion without pain. Neck supple. No thyromegaly present.  No meningismus  Cardiovascular: Normal rate, regular rhythm, S1 normal, S2 normal and intact distal pulses.   Pulmonary/Chest: Effort normal and breath sounds normal.  Abdominal: Soft. Bowel sounds are normal. There is no tenderness. There is no CVA tenderness.  Musculoskeletal: Normal range of motion.  Neurological: She is alert and oriented to person, place, and time. She has normal strength and normal reflexes. No cranial nerve deficit or sensory deficit. She displays a negative Romberg sign.  GCS eye subscore is 4. GCS verbal subscore is 5. GCS motor subscore is 6.  Normal Gait  Skin: Skin is warm and dry. No rash noted. No cyanosis. Nails show no clubbing.  Psychiatric: Her speech is normal and behavior is normal.  Anxious and tearful    ED Course  Procedures (including critical care time) Labs Review Labs Reviewed  COMPREHENSIVE METABOLIC PANEL - Abnormal; Notable for the following:    Sodium 134 (*)    Chloride 95 (*)    All other components within normal limits  CBC WITH DIFFERENTIAL   Imaging Review Ct Head Wo Contrast  10/11/2013   CLINICAL DATA:  Headache and hypertension.  EXAM: CT HEAD WITHOUT CONTRAST  TECHNIQUE: Contiguous axial images were obtained from the base of the skull through the vertex without contrast.  COMPARISON:  09/18/2012  FINDINGS: Normal appearance of the intracranial structures. No evidence for acute hemorrhage, mass lesion, midline shift, hydrocephalus or large infarct. No acute bony abnormality. The visualized sinuses are clear.  IMPRESSION: No acute intracranial abnormality.   Electronically Signed   By: Meriel Pica.D.  On: 10/11/2013 00:33    IV fluids.  IV magnesium for HA  11:43 PM d/w FPM DR Piedad Climes - will evaluate bedside  Filed Vitals:   10/10/13 2334  BP: 148/62  Pulse: 63  Temp: 98.2 F (36.8 C)  Resp: 18   12:53 AM headache resolved and patient is feeling much better. No change on normal neurologic exam.She is no longer tearful. She has scheduled followup with her primary care physician next week. She has also medications at home. She agrees to strict return precautions and all discharge and followup instructions. She'll rest tonight, be sure to drink plenty of fluids and take all medications as prescribed. MDM  Dx: Headache, HTN  Evaluated with labs and imaging as above. Treated with IV fluids and medications with symptoms improved. Vital signs and nurses notes reviewed and considered. Blood pressure normalized.Evaluated by  the family practice team who agree with treatment plan.    Sunnie Nielsen, MD 10/11/13 [phone]

## 2013-10-10 NOTE — Telephone Encounter (Signed)
Pt called because she is concerned about her BP. It was today 180/128 rt arm and 178/109 lt arm. She has a headache and have taken her medication and some tylenol but just would like someone to call her to discuss this with. Myriam Jacobson

## 2013-10-10 NOTE — ED Notes (Addendum)
Pt states she has been having increased BP recently and a headache all day today.  Called PCP and was instructed to come to the ED.  Pt states current headache is similar to headaches from high BP in the past.

## 2013-10-11 ENCOUNTER — Emergency Department (HOSPITAL_COMMUNITY): Payer: Federal, State, Local not specified - PPO

## 2013-10-20 ENCOUNTER — Encounter: Payer: Self-pay | Admitting: Family Medicine

## 2013-10-20 ENCOUNTER — Ambulatory Visit (INDEPENDENT_AMBULATORY_CARE_PROVIDER_SITE_OTHER): Payer: Federal, State, Local not specified - PPO | Admitting: Family Medicine

## 2013-10-20 VITALS — BP 140/88 | HR 68 | Temp 97.9°F | Wt 239.0 lb

## 2013-10-20 DIAGNOSIS — G894 Chronic pain syndrome: Secondary | ICD-10-CM

## 2013-10-20 DIAGNOSIS — F339 Major depressive disorder, recurrent, unspecified: Secondary | ICD-10-CM

## 2013-10-20 DIAGNOSIS — K319 Disease of stomach and duodenum, unspecified: Secondary | ICD-10-CM

## 2013-10-20 DIAGNOSIS — I10 Essential (primary) hypertension: Secondary | ICD-10-CM

## 2013-10-20 DIAGNOSIS — K929 Disease of digestive system, unspecified: Secondary | ICD-10-CM

## 2013-10-20 DIAGNOSIS — Z23 Encounter for immunization: Secondary | ICD-10-CM

## 2013-10-20 MED ORDER — MORPHINE SULFATE ER 30 MG PO TBCR: 30.0000 mg | EXTENDED_RELEASE_TABLET | Freq: Two times a day (BID) | ORAL | Status: DC

## 2013-10-20 MED ORDER — HYDROCODONE-ACETAMINOPHEN 10-325 MG PO TABS: 1.0000 | ORAL_TABLET | Freq: Three times a day (TID) | ORAL | Status: DC | PRN

## 2013-10-20 MED ORDER — HYDROCODONE-ACETAMINOPHEN 10-325 MG PO TABS: 1.0000 | ORAL_TABLET | Freq: Two times a day (BID) | ORAL | Status: DC | PRN

## 2013-10-20 MED ORDER — RAMIPRIL 10 MG PO CAPS: 10.0000 mg | ORAL_CAPSULE | Freq: Every day | ORAL | Status: DC

## 2013-10-20 MED ORDER — DICYCLOMINE HCL 20 MG PO TABS: 20.0000 mg | ORAL_TABLET | Freq: Three times a day (TID) | ORAL | Status: DC

## 2013-10-20 NOTE — Patient Instructions (Addendum)
Try trial of reducing your dicyclomine to half a tablet three times a day.    Your blood pressure is under good control today.

## 2013-10-21 ENCOUNTER — Encounter: Payer: Self-pay | Admitting: Family Medicine

## 2013-10-21 NOTE — Progress Notes (Signed)
Patient ID: [name], female   DOB: 08/07/1951, 62 y.o.   MRN: [id number]  Subjective:    Patient ID: [name], female    DOB: 1951-09-09, 62 y.o.   MRN: [id number]  HPI   Subjective:    Patient ID: [name], female    DOB: 28-Aug-1951, 62 y.o.   MRN: [id number]  HPI Patient Active Problem List  Diagnosis  . Obesity (BMI 30.0-34.9) Pt haslost weight on High Protein diet.  Not taking exercise She has not returned to gym.   Marland Kitchen DEPRESSION, MAJOR, RECURRENT Stable mood.  No ahedonia.  Followed by Dr Betti Cruz (Psych).  No recent changes in medications.    . IBS Keeping symptoms in check using daily Bentyl twice a day, uses Procholorperazine about once or twice a week.   No tics, no tremors, no unintended limb movements.   . RESTLESS LEGS SYNDROME: No change since last visit. Uses vicodin occasionally at night   . CHRONIC PAIN SYNDROME Adequatee pain control in hips and knees on MS Contin to 30 mg twice a day and as needed use of Meloxicam most of the time. Occasionally intermittent pain in right hip and buttock.  Pt  take Flexeril as needed for hip muscle spasm. Using same amount of Vicodan 3  tablets in a day, primarily at work. .   Continues to control constipation with miralax and fruits.    Marland Kitchen HYPERTENSION, BENIGN SYSTEMIC CHRONIC HYPERTENSION  Disease Monitoring             Had a visit to ED last week for headache with elevated BP.  No change in BP medications.   Blood pressure range: not monitoring at home  Chest pain: no   Dyspnea: no   Claudication: no   Medication compliance: yes  Medication Side Effects  Lightheadedness: no   Urinary frequency: no   Edema: no    Preventitive Healthcare:  Exercise: no   Diet Pattern: Low carbohydrate diet  Salt Restriction: yes, except salts vegetables.No distress.     . Esophageal Reflux - Currently under adequate symptom control taking omeprazole.  No Smoking Review of SystemsSee HPI     Objective:   Physical  Exam  Constitutional: Vital signs are normal. She is cooperative. Occasionally tearful when talking about stress around her car malfunctioning and financial difficulties but affect brightens when talking about how she plans to solve this issues.  Cardiovascular: Normal rate, regular rhythm and normal heart sounds.   Pulmonary/Chest: Effort normal and breath sounds normal.  Musculoskeletal: She exhibits no edema.  Psychiatric: She has a normal mood and affect. Her speech is normal and behavior is normal. Thought content normal. Cognition and memory are normal.      Assessment & Plan:

## 2013-10-21 NOTE — Assessment & Plan Note (Signed)
Adequate symptomatic control. Tolerating symptomatic medications. Continue current medications.  

## 2013-10-21 NOTE — Assessment & Plan Note (Signed)
Adequate blood pressure control.  No evidence of new end organ damage.  Tolerating medication without significant adverse effects.  Plan to continue current blood pressure regiment.   

## 2013-10-21 NOTE — Assessment & Plan Note (Signed)
Adequate symptomatic control. Tolerating symptomatic medications. Continue current medications.

## 2013-10-21 NOTE — Assessment & Plan Note (Signed)
Adequate chronic pain control. Able to participate in ADls and iADLs. 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  No significant adverse effect.  No aberrant medication seeking behaviors  Monthly refills of MS Contin (3 fills of prescription: now, in 30 days, in 60 days)  Hydrocodone/APAP 10/325 Disp#45 (3 fills of prescription: now, in 30 days, in 60 days)  RTC 3 months

## 2013-11-25 ENCOUNTER — Ambulatory Visit (INDEPENDENT_AMBULATORY_CARE_PROVIDER_SITE_OTHER): Payer: Federal, State, Local not specified - PPO | Admitting: Family Medicine

## 2013-11-25 ENCOUNTER — Ambulatory Visit
Admission: RE | Admit: 2013-11-25 | Discharge: 2013-11-25 | Disposition: A | Discharge status: Home / Self Care | Payer: Federal, State, Local not specified - PPO | Source: Ambulatory Visit | Attending: Family Medicine | Admitting: Family Medicine

## 2013-11-25 ENCOUNTER — Encounter: Payer: Self-pay | Admitting: Family Medicine

## 2013-11-25 VITALS — BP 120/72 | HR 64 | Temp 98.1°F | Ht 64.0 in | Wt 243.0 lb

## 2013-11-25 DIAGNOSIS — M25569 Pain in unspecified knee: Secondary | ICD-10-CM

## 2013-11-25 DIAGNOSIS — M25561 Pain in right knee: Secondary | ICD-10-CM

## 2013-11-25 NOTE — Patient Instructions (Signed)
[name],  Thank you for coming to see me today. Your R medial knee pain is consistent with osteoarthritis.  For further treatment and evaluation: 1. Go for R knee x-ray 2. Restart mobic one tab daily with food and water for at least the next 10 days 3. Continue ice 4. Elevation on pillows, sleep on L side or on back and compression recommended at night.  I will be in touch with x-ray results. If the above conservative therapy does not provide significant relief the next step is ortho or sports medicine for non-corticosteroid injections (synvisc).   Dr. Adrian Blackwater

## 2013-11-25 NOTE — Progress Notes (Signed)
   Subjective:    Patient ID: [name], female    DOB: August 28, 1951, 63 y.o.   MRN: [id number]  HPI 63 yo F with h/o of OA in multiple sites, obesity and chronic pain due to fibromyalgia  presents for same day visit for the following:  1. R knee pain:  X 2 months.  Medial knee pain. No injury. Pain is worse first thing in the morning. Mild swelling. Mild crepitus. No redness, fever, locking or popping. Has tried heat, ice and electrodes w/o relief. Has not tried mobic.   Had bad reaction to L hip corticosteroid injection 3 yrs ago.   Review of Systems As per HPI     Objective:   Physical Exam BP 120/72  Pulse 64  Temp(Src) 98.1 F (36.7 C) (Oral)  Ht 5\' 4"  (1.626 m)  Wt 243 lb (110.224 kg)  BMI 41.69 kg/m2 General appearance: alert, cooperative and morbidly obese Knee: Normal to inspection with no erythema or effusion or obvious bony abnormalities. Palpation normal with no warmth or joint line tenderness or patellar tenderness or condyle tenderness. ROM normal in flexion and extension and lower leg rotation. Pain is reproduced with flexion.  Ligaments with solid consistent endpoints including ACL, PCL, LCL, MCL. Negative Mcmurray's. Negative valgus test. Positive varus test.  Non painful patellar compression. Patellar and quadriceps tendons unremarkable. Hamstring and quadriceps strength is normal.  R knee x-ray ordered and reviewed:  FINDINGS:  There is no evidence of fracture, dislocation, or joint effusion.  There is no evidence of arthropathy or other focal bone abnormality.  Soft tissues are unremarkable.  IMPRESSION:  No acute bony abnormality.      Assessment & Plan:

## 2013-11-25 NOTE — Assessment & Plan Note (Signed)
A: medial knee pain. Suspect medial compartment OA vs flare of old meniscal injury. No acute injury.  P: X-ray of R knee: obtained and reviewed Patient to restart mobic Compression and elevation Consider ortho vs SM referral if pain persist despite conservative measures.

## 2013-11-26 ENCOUNTER — Telehealth: Payer: Self-pay | Admitting: Family Medicine

## 2013-11-26 NOTE — Telephone Encounter (Signed)
Called patient. Knee x-ray w/o significant arthritis. Instructions are the same with recommendation for ortho f/u if pain does not improve. Patient reports pains is slightly improved since lying on R side. Plans to take mobic today with supper.

## 2014-01-29 ENCOUNTER — Ambulatory Visit (INDEPENDENT_AMBULATORY_CARE_PROVIDER_SITE_OTHER): Payer: Federal, State, Local not specified - PPO | Admitting: Family Medicine

## 2014-01-29 ENCOUNTER — Encounter: Payer: Self-pay | Admitting: Family Medicine

## 2014-01-29 VITALS — BP 119/81 | HR 86 | Temp 98.3°F | Ht 64.0 in | Wt 250.9 lb

## 2014-01-29 DIAGNOSIS — G894 Chronic pain syndrome: Secondary | ICD-10-CM

## 2014-01-29 DIAGNOSIS — E039 Hypothyroidism, unspecified: Secondary | ICD-10-CM

## 2014-01-29 DIAGNOSIS — K319 Disease of stomach and duodenum, unspecified: Secondary | ICD-10-CM

## 2014-01-29 DIAGNOSIS — K929 Disease of digestive system, unspecified: Secondary | ICD-10-CM

## 2014-01-29 DIAGNOSIS — I10 Essential (primary) hypertension: Secondary | ICD-10-CM

## 2014-01-29 DIAGNOSIS — K589 Irritable bowel syndrome without diarrhea: Secondary | ICD-10-CM

## 2014-01-29 MED ORDER — MORPHINE SULFATE ER 30 MG PO TBCR: 30.0000 mg | EXTENDED_RELEASE_TABLET | Freq: Two times a day (BID) | ORAL | Status: DC

## 2014-01-29 MED ORDER — HYDROCODONE-ACETAMINOPHEN 10-325 MG PO TABS: 1.0000 | ORAL_TABLET | Freq: Three times a day (TID) | ORAL | Status: DC | PRN

## 2014-01-29 MED ORDER — NITROGLYCERIN 0.4 MG SL SUBL: 0.4000 mg | SUBLINGUAL_TABLET | SUBLINGUAL | Status: DC | PRN

## 2014-01-29 MED ORDER — OMEPRAZOLE 40 MG PO CPDR: 40.0000 mg | DELAYED_RELEASE_CAPSULE | Freq: Every day | ORAL | Status: DC

## 2014-01-29 MED ORDER — POTASSIUM CHLORIDE ER 10 MEQ PO TBCR: 40.0000 meq | EXTENDED_RELEASE_TABLET | Freq: Every day | ORAL | Status: DC

## 2014-01-29 MED ORDER — LEVOTHYROXINE SODIUM 50 MCG PO TABS: 150.0000 ug | ORAL_TABLET | Freq: Every day | ORAL | Status: DC

## 2014-01-29 MED ORDER — HYDROCODONE-ACETAMINOPHEN 10-325 MG PO TABS: 1.0000 | ORAL_TABLET | Freq: Two times a day (BID) | ORAL | Status: DC | PRN

## 2014-01-29 MED ORDER — RAMIPRIL 10 MG PO CAPS: 10.0000 mg | ORAL_CAPSULE | Freq: Every day | ORAL | Status: DC

## 2014-01-29 MED ORDER — DICYCLOMINE HCL 10 MG PO CAPS: 10.0000 mg | ORAL_CAPSULE | Freq: Three times a day (TID) | ORAL | Status: DC

## 2014-01-29 MED ORDER — HYDROCHLOROTHIAZIDE 25 MG PO TABS: 25.0000 mg | ORAL_TABLET | Freq: Every day | ORAL | Status: DC

## 2014-01-29 NOTE — Assessment & Plan Note (Signed)
No symptoms of excess or inadequate thyroxine replacement.  Continue Levothyroxine 150 mcg daily. TSH pending.

## 2014-01-29 NOTE — Assessment & Plan Note (Signed)
Adequate symptom control using lower dose of Bentyl 10 mg THREE TIMES DAILY with occasional dose of 20 mg instead of 10 mg.  Using Compazine about once or twice a month.   Continue current medications.

## 2014-01-29 NOTE — Assessment & Plan Note (Signed)
Adequate chronic pain control. Able to participate in ADls and iADLs. Continues to work part-time as Scientist, water quality.  No significant adverse effect.  No aberrant medication seeking behaviors  Monthly refills of MS Contin (3 fills of prescription: now, in 30 days, in 60 days, 90 days)  Hydrocodone/APAP 10/325 Disp#45 (3 fills of prescription: now, in 30 days, in 60 days, in 90 days)  RTC 3 to 4 months.

## 2014-01-29 NOTE — Assessment & Plan Note (Signed)
Adequate blood pressure control.  No evidence of new end organ damage.  Tolerating medication without significant adverse effects.  Plan to continue current blood pressure regiment.   

## 2014-01-29 NOTE — Progress Notes (Signed)
Patient ID: [name], female   DOB: 04-01-1951, 63 y.o.   MRN: [id number]  Subjective:    Patient ID: [name], female    DOB: 1951/05/17, 63 y.o.   MRN: [id number]  HPI   Subjective:    Patient ID: [name], female    DOB: Sep 17, 1951, 63 y.o.   MRN: [id number]  HPI Patient Active Problem List  Diagnosis  . Obesity (BMI 30.0-34.9) Has gained weight.   Interested in Bariatric surgery Not taking exercise She has not returned to gym.  Had tried Atkins diet that was initially successful, but she has not been able to remain consistent with diet.  Marland Kitchen DEPRESSION, MAJOR, RECURRENT Stable mood.  No ahedonia.  Followed by Dr Reece Levy (Psych).  No recent changes in medications. Next appointment in June    . IBS Keeping symptoms in check using daily Bentyl three times a day, either half tablet (10 mg) or whole tablet (20 mg) uses Procholorperazine about once or twice a week.   No tics, no tremors, no unintended limb movements.      . CHRONIC PAIN SYNDROME Adequatee pain control in hips and knees on MS Contin to 30 mg twice a day and as needed use of Meloxicam most of the time.  Ran out of her MS Contin last week, at appropriate time, and devloped worsening aching in hips, knees and back.  N longer using Flexeril Using same amount of Vicodan 3  tablets in a day, primarily at work. .   Continues to control constipation with miralax and fruits.    Marland Kitchen HYPERTENSION, BENIGN SYSTEMIC CHRONIC HYPERTENSION  Disease Monitoring             Not monitoring at home.    Blood pressure range: not monitoring at home  Chest pain: no   Dyspnea: no   Claudication: no   Medication compliance: yes  Medication Side Effects  Lightheadedness: no   Urinary frequency: no   Edema: no    Preventitive Healthcare:  Exercise: no   Diet Pattern: Low carbohydrate diet  Salt Restriction: yes    . Esophageal Reflux - Currently under adequate symptom control taking omeprazole.  No Smoking Review  of SystemsSee HPI     Objective:   Physical Exam  Constitutional: Vital signs are normal. She is cooperative. Occasionally tearful when talking about stress around her car malfunctioning and financial difficulties but affect brightens when talking about how she plans to solve this issues.  Cardiovascular: Normal rate, regular rhythm and normal heart sounds.   Pulmonary/Chest: Effort normal and breath sounds normal.  Musculoskeletal: She exhibits no edema.  Psychiatric: She has a normal mood and affect. Her speech is normal and behavior is normal. Thought content normal. Cognition and memory are normal.      Assessment & Plan:

## 2014-01-30 ENCOUNTER — Encounter: Payer: Self-pay | Admitting: Family Medicine

## 2014-01-30 ENCOUNTER — Other Ambulatory Visit: Payer: Self-pay | Admitting: Family Medicine

## 2014-01-30 LAB — TSH: TSH: 1.412 u[IU]/mL (ref 0.350–4.500)

## 2014-03-02 ENCOUNTER — Telehealth: Payer: Self-pay | Admitting: Family Medicine

## 2014-03-02 NOTE — Telephone Encounter (Signed)
Pt called because since she has COPD she has issues with breathing. She works for [employer] and has been allowed to have a stool, and a fan at the register. They have a new manager now and he is not allowing her to have the fan and stool. She would like you to write a letter stating that because of her COPD that the fan is beneficial to her breathing.He said that the new manager said that if she gets a letter from the doctor that she would le allowed to use it. 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 Please call when ready for pick up. jw

## 2014-03-03 NOTE — Telephone Encounter (Signed)
Requested letter completed. Sent to Admin to print.   Asked Admin to notify patient to pick up letter from front office.

## 2014-03-26 ENCOUNTER — Encounter: Payer: Self-pay | Admitting: Family Medicine

## 2014-03-26 ENCOUNTER — Ambulatory Visit (INDEPENDENT_AMBULATORY_CARE_PROVIDER_SITE_OTHER): Payer: Federal, State, Local not specified - PPO | Admitting: Family Medicine

## 2014-03-26 VITALS — BP 137/81 | HR 63 | Temp 98.6°F | Wt 239.0 lb

## 2014-03-26 DIAGNOSIS — E039 Hypothyroidism, unspecified: Secondary | ICD-10-CM

## 2014-03-26 DIAGNOSIS — M1990 Unspecified osteoarthritis, unspecified site: Secondary | ICD-10-CM

## 2014-03-26 MED ORDER — LEVOTHYROXINE SODIUM 50 MCG PO TABS: 150.0000 ug | ORAL_TABLET | Freq: Every day | ORAL | Status: DC

## 2014-03-26 NOTE — Patient Instructions (Signed)
Arthritis, Nonspecific  Arthritis is pain, redness, warmth, or puffiness (inflammation) of a joint. The joint may be stiff or hurt when you move it. One or more joints may be affected. There are many types of arthritis. Your doctor may not know what type you have right away. The most common cause of arthritis is wear and tear on the joint (osteoarthritis).  HOME CARE   · Only take medicine as told by your doctor.  · Rest the joint as much as possible.  · Raise (elevate) your joint if it is puffy.  · Use crutches if the painful joint is in your leg.  · Drink enough fluids to keep your pee (urine) clear or pale yellow.  · Follow your doctor's diet instructions.  · Use cold packs for very bad joint pain for 10 to 15 minutes every hour. Ask your doctor if it is okay for you to use hot packs.  · Exercise as told by your doctor.  · Take a warm shower if you have stiffness in the morning.  · Move your sore joints throughout the day.  GET HELP RIGHT AWAY IF:   · You have a fever.  · You have very bad joint pain, puffiness, or redness.  · You have many joints that are painful and puffy.  · You are not getting better with treatment.  · You have very bad back pain or leg weakness.  · You cannot control when you poop (bowel movement) or pee (urinate).  · You do not feel better in 24 hours or are getting worse.  · You are having side effects from your medicine.  MAKE SURE YOU:   · Understand these instructions.  · Will watch your condition.  · Will get help right away if you are not doing well or get worse.  Document Released: 01/17/2010 Document Revised: 04/23/2012 Document Reviewed: 01/17/2010  [employer]® Patient Information ©2014 [employer].

## 2014-03-26 NOTE — Progress Notes (Signed)
Subjective:     Patient ID: [name], female   DOB: Jul 27, 1951, 63 y.o.   MRN: [id number]  HPI Arthritis: Patient is here for follow up and to complete her plate card renewal form. She mentioned she has had arthritis of her hip and back for many years as well as her foot, she usually have pain prolonged ambulation and standing, her pain is controlled on Morphine and hydrocodone for break through pain.Patient stated she cannot walk long distant without stopping to rest. She gave hx of hip arthritis and herniated disc. Her pain is incapacitating.She is requesting July refill of her Hydrocodone, she has refill for May and prescription for June. Hypothyroidism:Patient need her synthroid refill send to [facility].  Current Outpatient Prescriptions on File Prior to Visit  Medication Sig Dispense Refill  . acetaminophen (TYLENOL) 500 MG tablet Take 1,000 mg by mouth 2 (two) times daily as needed (pain).       Marland Kitchen aspirin EC 81 MG tablet Take 81 mg by mouth at bedtime.      . Calcium Carbonate-Vitamin D (CALCIUM 600+D) 600-400 MG-UNIT per tablet Take 1 tablet by mouth daily.       Marland Kitchen dicyclomine (BENTYL) 10 MG capsule Take 1-2 capsules (10-20 mg total) by mouth 4 (four) times daily -  before meals and at bedtime.  360 capsule  prn  . dicyclomine (BENTYL) 20 MG tablet Take 1 tablet (20 mg total) by mouth 3 (three) times daily.  270 tablet  3  . fexofenadine (ALLEGRA) 180 MG tablet Take 180 mg by mouth daily.      . hydrochlorothiazide (HYDRODIURIL) 25 MG tablet Take 1 tablet (25 mg total) by mouth daily.  90 tablet  prn  . HYDROcodone-acetaminophen (NORCO) 10-325 MG per tablet Take 1 tablet by mouth every 8 (eight) hours as needed.  45 tablet  0  . HYDROcodone-acetaminophen (NORCO) 10-325 MG per tablet Take 1 tablet by mouth 2 (two) times daily as needed (pain). Fill 30 days after date on prescription,  45 tablet  0  . HYDROcodone-acetaminophen (NORCO) 10-325 MG per tablet Take 1 tablet by mouth every 8  (eight) hours as needed. Fill 60 days after date on prescription  45 tablet  0  . hydrOXYzine (ATARAX/VISTARIL) 50 MG tablet Take 150 mg by mouth at bedtime. May take one additional  tablet  a day as needed for hives      . LORazepam (ATIVAN) 1 MG tablet Take 1 mg by mouth 3 (three) times daily. For anxiety      . meloxicam (MOBIC) 7.5 MG tablet Take 7.5 mg by mouth daily.      . metoprolol (LOPRESSOR) 50 MG tablet Take 1 tablet (50 mg total) by mouth 2 (two) times daily.  180 tablet  3  . morphine (MS CONTIN) 30 MG 12 hr tablet Take 1 tablet (30 mg total) by mouth 2 (two) times daily. Fill 30 days after date on prescription  60 tablet  0  . morphine (MS CONTIN) 30 MG 12 hr tablet Take 1 tablet (30 mg total) by mouth every 12 (twelve) hours. Fill 60 days after date on prescription  60 tablet  0  . morphine (MS CONTIN) 30 MG 12 hr tablet Take 1 tablet (30 mg total) by mouth every 12 (twelve) hours.  60 tablet  0  . Multiple Vitamin (MULTIVITAMIN WITH MINERALS) TABS tablet Take 1 tablet by mouth daily.      . nitroGLYCERIN (NITROSTAT) 0.4 MG SL tablet Place  1 tablet (0.4 mg total) under the tongue every 5 (five) minutes as needed for chest pain.  25 tablet  prn  . omeprazole (PRILOSEC) 40 MG capsule Take 1 capsule (40 mg total) by mouth daily.  90 capsule  3  . polyethylene glycol (MIRALAX) powder Take 17 g by mouth every other day.       . potassium chloride (K-DUR) 10 MEQ tablet Take 4 tablets (40 mEq total) by mouth at bedtime.  120 tablet  prn  . Probiotic Product (PROBIOTIC PO) Take 1 tablet by mouth daily.      . prochlorperazine (COMPAZINE) 10 MG tablet Take 10 mg by mouth 2 (two) times daily as needed for nausea or vomiting.      . ramipril (ALTACE) 10 MG capsule Take 1 capsule (10 mg total) by mouth daily.  90 capsule  3  . TraZODone HCl (OLEPTRO) 300 MG TB24 Take 300 mg by mouth at bedtime.       . vitamin B-12 (CYANOCOBALAMIN) 1000 MCG tablet Take 1,000 mcg by mouth daily.       No  current facility-administered medications on file prior to visit.   Past Medical History  Diagnosis Date  . History of MRSA infection 04/28/2011  . Solitary lung nodule 04/08/2011  . Hepatic steatosis 04/12/2011  . SIGMOID POLYP 01/23/2008  . HYPOTHYROIDISM, BORDERLINE 02/15/2007  . VITAMIN D DEFICIENCY 03/03/2010  . LACTOSE INTOLERANCE 03/10/2008  . HYPERCHOLESTEROLEMIA 12/24/2008  . Hypertension   . Urticaria, chronic 05/22/2011  . Carpal tunnel syndrome 02/14/2007    S/P carpel tunnel release bilaterally.    . Hypothyroidism 02/15/2007    Qualifier: Diagnosis of  By: McDiarmid MD, Sherren Mocha        Review of Systems  Respiratory: Negative.   Cardiovascular: Negative.   Gastrointestinal: Negative.   Musculoskeletal: Positive for arthralgias, back pain and gait problem.  All other systems reviewed and are negative.      Filed Vitals:   03/26/14 1411  BP: 137/81  Pulse: 63  Temp: 98.6 F (37 C)  TempSrc: Oral  Weight: 239 lb (108.41 kg)    Objective:   Physical Exam  Nursing note and vitals reviewed. Constitutional: She appears well-developed.  Cardiovascular: Normal rate, regular rhythm and normal heart sounds.   No murmur heard. Pulmonary/Chest: Effort normal and breath sounds normal. No respiratory distress. She has no wheezes.  Abdominal: Soft. Bowel sounds are normal. She exhibits no distension and no mass. There is no tenderness.  Musculoskeletal: Normal range of motion. She exhibits no edema and no tenderness.       Assessment:     Arthritis Hyothyroidism     Plan:     Check problem list.

## 2014-03-27 DIAGNOSIS — M1990 Unspecified osteoarthritis, unspecified site: Secondary | ICD-10-CM | POA: Insufficient documentation

## 2014-03-27 NOTE — Assessment & Plan Note (Signed)
No acute flare. I refilled her synthroid to [facility].

## 2014-03-27 NOTE — Assessment & Plan Note (Signed)
Multiple site including hip and spine. I reviewed her PCP previous documentation, her pain is controlled on Hydrocodone. Since patient has refills for May and June, I recommended she contacts her PCP or person covering for him for refill in June. She agreed with plan. I completed her DMV plate card form.

## 2014-04-28 ENCOUNTER — Telehealth: Payer: Self-pay | Admitting: Family Medicine

## 2014-04-28 MED ORDER — HYDROCODONE-ACETAMINOPHEN 10-325 MG PO TABS: 1.0000 | ORAL_TABLET | Freq: Three times a day (TID) | ORAL | Status: DC | PRN

## 2014-04-28 MED ORDER — MORPHINE SULFATE ER 30 MG PO TBCR: 30.0000 mg | EXTENDED_RELEASE_TABLET | Freq: Two times a day (BID) | ORAL | Status: DC

## 2014-04-28 NOTE — Telephone Encounter (Signed)
Printed and given to [employer]

## 2014-04-28 NOTE — Telephone Encounter (Signed)
LM for patient that "rx you requested have been placed up front for pick up."  [name],CMA

## 2014-04-28 NOTE — Telephone Encounter (Signed)
Needs new prescription on MS cotton and hydrocodene] She has an appt July 23 but will run out of meds by then

## 2014-05-20 ENCOUNTER — Other Ambulatory Visit: Payer: Self-pay | Admitting: Family Medicine

## 2014-05-21 NOTE — Telephone Encounter (Signed)
Metoprolol refilled #180 with 3 refill

## 2014-05-28 ENCOUNTER — Encounter: Payer: Self-pay | Admitting: Family Medicine

## 2014-05-28 ENCOUNTER — Ambulatory Visit (INDEPENDENT_AMBULATORY_CARE_PROVIDER_SITE_OTHER): Payer: Federal, State, Local not specified - PPO | Admitting: Family Medicine

## 2014-05-28 VITALS — BP 110/50 | HR 64 | Temp 97.6°F | Ht 64.0 in | Wt 249.0 lb

## 2014-05-28 DIAGNOSIS — G894 Chronic pain syndrome: Secondary | ICD-10-CM

## 2014-05-28 DIAGNOSIS — I10 Essential (primary) hypertension: Secondary | ICD-10-CM

## 2014-05-28 DIAGNOSIS — F339 Major depressive disorder, recurrent, unspecified: Secondary | ICD-10-CM

## 2014-05-28 MED ORDER — HYDROCODONE-ACETAMINOPHEN 10-325 MG PO TABS: 1.0000 | ORAL_TABLET | Freq: Three times a day (TID) | ORAL | Status: DC | PRN

## 2014-05-28 MED ORDER — HYDROCODONE-ACETAMINOPHEN 10-325 MG PO TABS: 1.0000 | ORAL_TABLET | Freq: Two times a day (BID) | ORAL | Status: DC | PRN

## 2014-05-28 MED ORDER — MORPHINE SULFATE ER 30 MG PO TBCR: 30.0000 mg | EXTENDED_RELEASE_TABLET | Freq: Two times a day (BID) | ORAL | Status: DC

## 2014-05-28 NOTE — Patient Instructions (Signed)
Decrease your Metoprolol to 25 mg (half tablet) twice a day.  If you are lightheaded or feel like your are going to pass out with standing, let Dr Takisha Pelle know as he may want you to decrease your blood pressure medications some more.   Try decreasing the number of hydroxyzine tablets you take with the minipress medication. Start with taking two tablets hydroxyzine at bedtime, see if you still sleep well for a week.  If you tolerate this decrease, then take just one hydroxyzine tablet at bedtime.

## 2014-05-30 ENCOUNTER — Encounter: Payer: Self-pay | Admitting: Family Medicine

## 2014-05-30 NOTE — Assessment & Plan Note (Signed)
Adequate symptom control.Increased sleep and lightheadedness with addition of Minipress to regiment of lorazepam, trazadone and hydroxyzine prescribed by Dr Reece Levy (Psych) medication.  Recommend patient try trial of decreasing hydroxyzine to 50 to 100 mg hydroxyzine at bedtime. Continue Minipress.Marland Kitchen

## 2014-05-30 NOTE — Assessment & Plan Note (Signed)
BP excesively reduced and associated lightheadedness with standing with addition of Minipress for sleep. Recommend reduction of metoprolol to 25 mg twice daily. May stop metoprolol entirely if BP remains excessively reduced. Continue HCTZ and Ramipril for now.Monitoring BMP and Lipid next office visit in 3 m.

## 2014-05-30 NOTE — Progress Notes (Signed)
Patient ID: [name], female   DOB: 27-Oct-1951, 63 y.o.   MRN: [id number]  Subjective:    Patient ID: [name], female    DOB: 02-01-1951, 63 y.o.   MRN: [id number]  HPI   Subjective:    Patient ID: [name], female    DOB: 1950-11-27, 63 y.o.   MRN: [id number]  HPI Patient Active Problem List  Diagnosis  . DEPRESSION, MAJOR, RECURRENT Stable mood.  No ahedonia.  Followed by Dr Reece Levy (Psyc). He started pt on Minipres for sleep. Patient now sleeping 8 to 12 hous with grogginess in morning.  Pt fearful she will oversleep in morning. Pt still taking hydroxyzine 150 mg at bedtime for sleep     . IBS Keeping symptoms in check using daily Bentyl three times a day, either half tablet (10 mg) or whole tablet (20 mg) uses Procholorperazine about once or twice a week.   No tics, no tremors, no unintended limb movements.      . CHRONIC PAIN SYNDROME Longstanding pain condition with primary OA of hips, knees, spines Adequate pain control in hips and knees and backon MS Contin to 30 mg twice a day and as needed use of Meloxicam most of the time.Using same amount of Vicodan 3  tablets in a day, primarily at work.  She continues to be able to work 3 days aweek for about 5 hour stretches .   Continues to control constipation with miralax and fruits.  Denies falls, MVAs Able to perform ADLs and iADLs PLM is stable   . HYPERTENSION, BENIGN SYSTEMIC CHRONIC HYPERTENSION  Disease Monitoring             Not monitoring at home.    Blood pressure range: not monitoring at home  Chest pain: no   Dyspnea: no   Claudication: no   Medication compliance: yes  Medication Side Effects  Lightheadedness: Yes   Urinary frequency: no   Edema: no    Preventitive Healthcare:  Exercise: no   Diet Pattern: Low carbohydrate diet  Salt Restriction: yes    . Esophageal Reflux - Currently under adequate symptom control taking omeprazole.  No Smoking Review of SystemsSee HPI      Objective:   Physical Exam  Constitutional: Vital signs are normal. She is cooperative. Occasionally tearful when talking about stress around her car malfunctioning and financial difficulties but affect brightens when talking about how she plans to solve this issues.  Cardiovascular: Normal rate, regular rhythm and normal heart sounds.   Pulmonary/Chest: Effort normal and breath sounds normal.  Musculoskeletal: She exhibits no edema.  Psychiatric: She has a normal mood and affect. Her speech is normal and behavior is normal. Thought content normal. Cognition and memory are normal.      Assessment & Plan:

## 2014-05-30 NOTE — Assessment & Plan Note (Signed)
Adequate chronic pain control. Able to participate in ADls and iADLs. Continues to work part-time as Scientist, water quality.  No significant adverse effect.  No aberrant medication seeking behaviors  Monthly refills of MS Contin (2 fills of prescription: now, in 30 days, in 60 days, 90 days)  Hydrocodone/APAP 10/325 Disp#45 (2 fills of prescription: now, in 30 days, in 60 days, in 90 days)  RTC 66m Trial of decreasing total daily Morphine equivalent dose by 15%  Next ov

## 2014-05-30 NOTE — Assessment & Plan Note (Signed)
Check surveillance A1C next office visit

## 2014-06-05 ENCOUNTER — Other Ambulatory Visit: Payer: Self-pay | Admitting: Family Medicine

## 2014-06-16 ENCOUNTER — Telehealth: Payer: Self-pay | Admitting: Family Medicine

## 2014-06-16 NOTE — Telephone Encounter (Signed)
Will forward to MD. Theoden Mauch,CMA  

## 2014-06-16 NOTE — Telephone Encounter (Signed)
Pt called and wanted Dr. McDiarmid to know that when she was taking her Metoprolol 50 mg twice a day her blood sugar was 108/58 but when she went to 25 mg twice a day her blood sugar went up to 158/110. She has went back to taking her Metoprolol twice a day at 50 mg. Blima Rich

## 2014-07-21 ENCOUNTER — Ambulatory Visit (INDEPENDENT_AMBULATORY_CARE_PROVIDER_SITE_OTHER): Payer: Federal, State, Local not specified - PPO | Admitting: Family Medicine

## 2014-07-21 ENCOUNTER — Encounter: Payer: Self-pay | Admitting: Family Medicine

## 2014-07-21 VITALS — BP 158/77 | HR 72 | Temp 98.1°F | Resp 18 | Wt 252.0 lb

## 2014-07-21 DIAGNOSIS — J4489 Other specified chronic obstructive pulmonary disease: Secondary | ICD-10-CM | POA: Diagnosis not present

## 2014-07-21 DIAGNOSIS — J449 Chronic obstructive pulmonary disease, unspecified: Secondary | ICD-10-CM

## 2014-07-21 MED ORDER — BENZONATATE 200 MG PO CAPS
200.0000 mg | ORAL_CAPSULE | Freq: Three times a day (TID) | ORAL | Status: DC | PRN
Start: 2014-07-21 — End: 2014-08-20

## 2014-07-21 NOTE — Assessment & Plan Note (Addendum)
Previously was told and had PFT's showing COPD.  However, this was several years ago and she is currently not on inhalers. O2 sat 98% on RA today, not tachycardic, tachypnic, or febrile At this point, her sx c/w bronchitis as all Upper airway and she does not have any fever, chills, sweats, increased mucous production, change in mucous consistency, or increase in cough.  Her SOB is about at baseline and denies any CP at this time as well.  Will tx as acute bronchitis with Tessalon, Mucinex 600 mg BID, hydration, and ibuprofen/tylenol for pain.  At her next PCP visit, recommended discussing having repeat PFT's (card given for Dr. Valentina Lucks) along with possible spiriva as she cannot take steroids 2/2 acute depression?  F/U PRN and if no improvement by later this week/early next week, will call in 5 days of azithromycin or doxycycline 100 mg BID x 7 days for possible lower lung involvement.    Well's score 0, Heart score 2 due to age.  With O2 sat at 98%, not tachycardic or tachypnea, will not pursue acute coronary syndrome or PE as very low likelihood of each.  Pt is amenable to this and understands indications to call or go to the ED.

## 2014-07-21 NOTE — Patient Instructions (Signed)
Please take mucinex 600 mg, two times per day. Please take the tessalon pearls three times per day as needed for cough. If you do not have improvement, please call back.  Thanks, Dr. Awanda Mink

## 2014-07-21 NOTE — Progress Notes (Signed)
[name] is a 63 y.o. female who presents today for ongoing shortness of breath.  Pt states this has been ongoing now for years and she was tx before for COPD exacerbation.  Her shortness of breath at rest has gotten slightly worse over the past couple of days but is very similar to her "COPD" dyspnea.  She denies any CP, radiating Sx, fever, chills, sweats, PND, pillow orthopnea, lower extremity edema, worsening functional capacity, substernal pressure or burning.  She previously smoked but quit about 6 yrs ago and was told about 3-4 years ago she had COPD.  However, she cannot take steroids or steroid inhaler due to significant depressive reactions.  Denies any increased sputum production/change in consistency, increased cough over the past couple of days.  Denies recent travel, prolonged immobilization, recent surgery, calf pain or tenderness, increased risk of blood clot.   Past Medical History  Diagnosis Date  . History of MRSA infection 04/28/2011  . Solitary lung nodule 04/08/2011  . Hepatic steatosis 04/12/2011  . SIGMOID POLYP 01/23/2008  . HYPOTHYROIDISM, BORDERLINE 02/15/2007  . VITAMIN D DEFICIENCY 03/03/2010  . LACTOSE INTOLERANCE 03/10/2008  . HYPERCHOLESTEROLEMIA 12/24/2008  . Hypertension   . Urticaria, chronic 05/22/2011  . Carpal tunnel syndrome 02/14/2007    S/P carpel tunnel release bilaterally.    . Hypothyroidism 02/15/2007    Qualifier: Diagnosis of  By: McDiarmid MD, Sherren Mocha    . Adrenal nodule 04/08/2011    04/08/11 Abdominal CT showed Left adrenal nodule which is tachnically indeterminate but most likely an adenoma.  It is 1.7 cm and 42 HU on portal venous phase image. 31 HU on delayed images.   01/09/2012 Abdominal CT w/ & w/o CM: Stable benign left adrenal adenoma. No further specific follow-up is needed for this finding.    Marland Kitchen Herpes simplex labialis 05/11/2011  . HEMORRHOIDS, NOS 01/03/2007    Qualifier: Diagnosis of  By: Drucie Ip    . Rosacea, acne 10/24/2011    History   Smoking status  . Former Smoker -- 1.50 packs/day for 44 years  . Types: Cigarettes  . Quit date: 04/06/2008  Smokeless tobacco  . Former Systems developer  . Quit date: 04/06/2008    Family History  Problem Relation Age of Onset  . Diabetes type II    . Hypertension    . Heart attack    . Alcohol abuse    . Depression    . Asthma    . Migraines      Current Outpatient Prescriptions on File Prior to Visit  Medication Sig Dispense Refill  . acetaminophen (TYLENOL) 500 MG tablet Take 1,000 mg by mouth 2 (two) times daily as needed (pain).       Marland Kitchen aspirin EC 81 MG tablet Take 81 mg by mouth at bedtime.      . Calcium Carbonate-Vitamin D (CALCIUM 600+D) 600-400 MG-UNIT per tablet Take 1 tablet by mouth daily.       . cyclobenzaprine (FLEXERIL) 10 MG tablet Take one tablet by mouth three times daily as needed for muscle spasm no driving while taking  90 tablet  0  . dicyclomine (BENTYL) 10 MG capsule Take 1-2 capsules (10-20 mg total) by mouth 4 (four) times daily -  before meals and at bedtime.  360 capsule  prn  . dicyclomine (BENTYL) 20 MG tablet Take 1 tablet (20 mg total) by mouth 3 (three) times daily.  270 tablet  3  . fexofenadine (ALLEGRA) 180 MG tablet Take 180 mg  by mouth daily.      . hydrochlorothiazide (HYDRODIURIL) 25 MG tablet Take 1 tablet (25 mg total) by mouth daily.  90 tablet  prn  . HYDROcodone-acetaminophen (NORCO) 10-325 MG per tablet Take 1 tablet by mouth every 8 (eight) hours as needed.  45 tablet  0  . HYDROcodone-acetaminophen (NORCO) 10-325 MG per tablet Take 1 tablet by mouth every 8 (eight) hours as needed. Fill 60 days after date on prescription  45 tablet  0  . HYDROcodone-acetaminophen (NORCO) 10-325 MG per tablet Take 1 tablet by mouth 3 (three) times daily as needed (pain). Fill 30 days after date on prescription,      . hydrOXYzine (ATARAX/VISTARIL) 50 MG tablet Take 150 mg by mouth at bedtime. May take one additional  tablet  a day as needed for hives      .  levothyroxine (SYNTHROID, LEVOTHROID) 50 MCG tablet Take 3 tablets (150 mcg total) by mouth daily before breakfast.  270 tablet  1  . LORazepam (ATIVAN) 1 MG tablet Take 1 mg by mouth 3 (three) times daily. For anxiety      . meloxicam (MOBIC) 7.5 MG tablet Take 7.5 mg by mouth daily.      . metoprolol (LOPRESSOR) 50 MG tablet 0.5 tablets (25 mg total).  90 tablet  3  . morphine (MS CONTIN) 30 MG 12 hr tablet Take 1 tablet (30 mg total) by mouth every 12 (twelve) hours.  60 tablet  0  . morphine (MS CONTIN) 30 MG 12 hr tablet Take 1 tablet (30 mg total) by mouth 2 (two) times daily. Fill 30 days after date on prescription  60 tablet  0  . morphine (MS CONTIN) 30 MG 12 hr tablet Take 1 tablet (30 mg total) by mouth every 12 (twelve) hours. Fill 60 days after date on prescription  60 tablet  0  . Multiple Vitamin (MULTIVITAMIN WITH MINERALS) TABS tablet Take 1 tablet by mouth daily.      . nitroGLYCERIN (NITROSTAT) 0.4 MG SL tablet Place 1 tablet (0.4 mg total) under the tongue every 5 (five) minutes as needed for chest pain.  25 tablet  prn  . omeprazole (PRILOSEC) 40 MG capsule Take 1 capsule (40 mg total) by mouth daily.  90 capsule  3  . polyethylene glycol (MIRALAX) powder Take 17 g by mouth every other day.       . potassium chloride (K-DUR) 10 MEQ tablet Take 4 tablets (40 mEq total) by mouth at bedtime.  120 tablet  prn  . prazosin (MINIPRESS) 1 MG capsule Take 1 capsule (1 mg total) by mouth at bedtime.      . Probiotic Product (PROBIOTIC PO) Take 1 tablet by mouth daily.      . prochlorperazine (COMPAZINE) 10 MG tablet Take 10 mg by mouth 2 (two) times daily as needed for nausea or vomiting.      . ramipril (ALTACE) 10 MG capsule Take 1 capsule (10 mg total) by mouth daily.  90 capsule  3  . TraZODone HCl (OLEPTRO) 300 MG TB24 Take 300 mg by mouth at bedtime.       . vitamin B-12 (CYANOCOBALAMIN) 1000 MCG tablet Take 1,000 mcg by mouth daily.       No current facility-administered  medications on file prior to visit.    ROS: Per HPI.  All other systems reviewed and are negative.   Physical Exam Filed Vitals:   07/21/14 1632  BP: 158/77  Pulse: 72  Temp:  98.1 F (36.7 C)  Resp: 18   Pulse Ox - 98% on RA  Physical Examination: General appearance - alert, well appearing, and in no distress Mental status - alert, oriented to person, place, and time Mouth - MMM, O/P clear Neck - supple, no significant adenopathy Lymphatics - no palpable lymphadenopathy Chest - CTAB, no increased WOB, accessory muscle use, rhonchi/wheezing/rales all absent B/L,  Upper airway sounds transmitted throughout  Heart - normal rate and regular rhythm Extremities: No calf TTP B/L, Homan Sign Negative B/L     Chemistry      Component Value Date/Time   NA 134* 10/10/2013 2100   K 4.0 10/10/2013 2100   CL 95* 10/10/2013 2100   CO2 29 10/10/2013 2100   BUN 13 10/10/2013 2100   CREATININE 0.68 10/10/2013 2100   CREATININE 0.69 06/23/2013 1446      Component Value Date/Time   CALCIUM 9.8 10/10/2013 2100   ALKPHOS 62 10/10/2013 2100   AST 22 10/10/2013 2100   ALT 22 10/10/2013 2100   BILITOT 0.4 10/10/2013 2100

## 2014-07-28 ENCOUNTER — Encounter: Payer: Self-pay | Admitting: *Deleted

## 2014-07-28 ENCOUNTER — Emergency Department (HOSPITAL_COMMUNITY)
Admission: EM | Admit: 2014-07-28 | Discharge: 2014-07-28 | Disposition: A | Discharge status: Home / Self Care | Payer: Federal, State, Local not specified - PPO | Attending: Emergency Medicine | Admitting: Emergency Medicine

## 2014-07-28 ENCOUNTER — Emergency Department (HOSPITAL_COMMUNITY): Payer: Federal, State, Local not specified - PPO

## 2014-07-28 ENCOUNTER — Encounter (HOSPITAL_COMMUNITY): Payer: Self-pay | Admitting: Emergency Medicine

## 2014-07-28 DIAGNOSIS — IMO0002 Reserved for concepts with insufficient information to code with codable children: Secondary | ICD-10-CM | POA: Insufficient documentation

## 2014-07-28 DIAGNOSIS — Z8719 Personal history of other diseases of the digestive system: Secondary | ICD-10-CM | POA: Diagnosis not present

## 2014-07-28 DIAGNOSIS — Z872 Personal history of diseases of the skin and subcutaneous tissue: Secondary | ICD-10-CM | POA: Insufficient documentation

## 2014-07-28 DIAGNOSIS — R0602 Shortness of breath: Secondary | ICD-10-CM | POA: Diagnosis not present

## 2014-07-28 DIAGNOSIS — Z79899 Other long term (current) drug therapy: Secondary | ICD-10-CM | POA: Insufficient documentation

## 2014-07-28 DIAGNOSIS — F411 Generalized anxiety disorder: Secondary | ICD-10-CM | POA: Insufficient documentation

## 2014-07-28 DIAGNOSIS — Z87891 Personal history of nicotine dependence: Secondary | ICD-10-CM | POA: Diagnosis not present

## 2014-07-28 DIAGNOSIS — Z8669 Personal history of other diseases of the nervous system and sense organs: Secondary | ICD-10-CM | POA: Diagnosis not present

## 2014-07-28 DIAGNOSIS — Z7982 Long term (current) use of aspirin: Secondary | ICD-10-CM | POA: Insufficient documentation

## 2014-07-28 DIAGNOSIS — E039 Hypothyroidism, unspecified: Secondary | ICD-10-CM | POA: Insufficient documentation

## 2014-07-28 DIAGNOSIS — E7800 Pure hypercholesterolemia, unspecified: Secondary | ICD-10-CM | POA: Insufficient documentation

## 2014-07-28 DIAGNOSIS — I10 Essential (primary) hypertension: Secondary | ICD-10-CM | POA: Diagnosis not present

## 2014-07-28 DIAGNOSIS — R079 Chest pain, unspecified: Secondary | ICD-10-CM | POA: Diagnosis not present

## 2014-07-28 DIAGNOSIS — R059 Cough, unspecified: Secondary | ICD-10-CM | POA: Diagnosis not present

## 2014-07-28 DIAGNOSIS — R05 Cough: Secondary | ICD-10-CM | POA: Diagnosis not present

## 2014-07-28 DIAGNOSIS — Z8619 Personal history of other infectious and parasitic diseases: Secondary | ICD-10-CM | POA: Diagnosis not present

## 2014-07-28 DIAGNOSIS — Z8614 Personal history of Methicillin resistant Staphylococcus aureus infection: Secondary | ICD-10-CM | POA: Insufficient documentation

## 2014-07-28 DIAGNOSIS — F419 Anxiety disorder, unspecified: Secondary | ICD-10-CM

## 2014-07-28 LAB — CBC
HCT: 40.7 % (ref 36.0–46.0)
HEMOGLOBIN: 13.7 g/dL (ref 12.0–15.0)
MCH: 29.8 pg (ref 26.0–34.0)
MCHC: 33.7 g/dL (ref 30.0–36.0)
MCV: 88.5 fL (ref 78.0–100.0)
Platelets: 236 10*3/uL (ref 150–400)
RBC: 4.6 MIL/uL (ref 3.87–5.11)
RDW: 12.8 % (ref 11.5–15.5)
WBC: 7.7 10*3/uL (ref 4.0–10.5)

## 2014-07-28 LAB — I-STAT TROPONIN, ED
TROPONIN I, POC: 0 ng/mL (ref 0.00–0.08)
TROPONIN I, POC: 0 ng/mL (ref 0.00–0.08)

## 2014-07-28 LAB — BASIC METABOLIC PANEL
ANION GAP: 16 — AB (ref 5–15)
BUN: 11 mg/dL (ref 6–23)
CALCIUM: 9.5 mg/dL (ref 8.4–10.5)
CHLORIDE: 95 meq/L — AB (ref 96–112)
CO2: 25 meq/L (ref 19–32)
Creatinine, Ser: 0.61 mg/dL (ref 0.50–1.10)
GFR calc Af Amer: >90 mL/min (ref >90)
GFR calc non Af Amer: >90 mL/min (ref >90)
GLUCOSE: 108 mg/dL — AB (ref 70–99)
POTASSIUM: 4.2 meq/L (ref 3.7–5.3)
SODIUM: 136 meq/L — AB (ref 137–147)

## 2014-07-28 LAB — PRO B NATRIURETIC PEPTIDE: Pro B Natriuretic peptide (BNP): 121.9 pg/mL (ref 0–125)

## 2014-07-28 MED ORDER — GI COCKTAIL ~~LOC~~
30.0000 mL | Freq: Once | ORAL | Status: AC
  Administered 2014-07-28: 30 mL via ORAL
  Filled 2014-07-28: qty 30

## 2014-07-28 MED ORDER — AZITHROMYCIN 250 MG PO TABS: 250.0000 mg | ORAL_TABLET | Freq: Every day | ORAL | Status: DC

## 2014-07-28 MED ORDER — ALBUTEROL SULFATE (2.5 MG/3ML) 0.083% IN NEBU
5.0000 mg | INHALATION_SOLUTION | Freq: Once | RESPIRATORY_TRACT | Status: AC
  Administered 2014-07-28: 5 mg via RESPIRATORY_TRACT
  Filled 2014-07-28: qty 6

## 2014-07-28 MED ORDER — ACETAMINOPHEN 325 MG PO TABS
650.0000 mg | ORAL_TABLET | Freq: Once | ORAL | Status: AC
  Administered 2014-07-28: 650 mg via ORAL
  Filled 2014-07-28: qty 2

## 2014-07-28 MED ORDER — ALBUTEROL SULFATE HFA 108 (90 BASE) MCG/ACT IN AERS
2.0000 | INHALATION_SPRAY | RESPIRATORY_TRACT | Status: DC | PRN
  Administered 2014-07-28: 2 via RESPIRATORY_TRACT
  Filled 2014-07-28: qty 6.7

## 2014-07-28 MED ORDER — METOPROLOL TARTRATE 25 MG PO TABS
50.0000 mg | ORAL_TABLET | Freq: Once | ORAL | Status: AC
  Administered 2014-07-28: 50 mg via ORAL
  Filled 2014-07-28: qty 2

## 2014-07-28 NOTE — Discharge Instructions (Signed)
1. Medications: albuterol, azithromycin, usual home medications 2. Treatment: rest, drink plenty of fluids,  3. Follow Up: Please followup with your primary doctor in 3 days for discussion of your diagnoses and further evaluation after today's visit; department for worsening chest pain that is associated with nausea, diaphoresis, near syncope or radiation.   Acute Bronchitis Bronchitis is inflammation of the airways that extend from the windpipe into the lungs (bronchi). The inflammation often causes mucus to develop. This leads to a cough, which is the most common symptom of bronchitis.  In acute bronchitis, the condition usually develops suddenly and goes away over time, usually in a couple weeks. Smoking, allergies, and asthma can make bronchitis worse. Repeated episodes of bronchitis may cause further lung problems.  CAUSES Acute bronchitis is most often caused by the same virus that causes a cold. The virus can spread from person to person (contagious) through coughing, sneezing, and touching contaminated objects. SIGNS AND SYMPTOMS   Cough.   Fever.   Coughing up mucus.   Body aches.   Chest congestion.   Chills.   Shortness of breath.   Sore throat.  DIAGNOSIS  Acute bronchitis is usually diagnosed through a physical exam. Your health care provider will also ask you questions about your medical history. Tests, such as chest X-rays, are sometimes done to rule out other conditions.  TREATMENT  Acute bronchitis usually goes away in a couple weeks. Oftentimes, no medical treatment is necessary. Medicines are sometimes given for relief of fever or cough. Antibiotic medicines are usually not needed but may be prescribed in certain situations. In some cases, an inhaler may be recommended to help reduce shortness of breath and control the cough. A cool mist vaporizer may also be used to help thin bronchial secretions and make it easier to clear the chest.  HOME CARE  INSTRUCTIONS  Get plenty of rest.   Drink enough fluids to keep your urine clear or pale yellow (unless you have a medical condition that requires fluid restriction). Increasing fluids may help thin your respiratory secretions (sputum) and reduce chest congestion, and it will prevent dehydration.   Take medicines only as directed by your health care provider.  If you were prescribed an antibiotic medicine, finish it all even if you start to feel better.  Avoid smoking and secondhand smoke. Exposure to cigarette smoke or irritating chemicals will make bronchitis worse. If you are a smoker, consider using nicotine gum or skin patches to help control withdrawal symptoms. Quitting smoking will help your lungs heal faster.   Reduce the chances of another bout of acute bronchitis by washing your hands frequently, avoiding people with cold symptoms, and trying not to touch your hands to your mouth, nose, or eyes.   Keep all follow-up visits as directed by your health care provider.  SEEK MEDICAL CARE IF: Your symptoms do not improve after 1 week of treatment.  SEEK IMMEDIATE MEDICAL CARE IF:  You develop an increased fever or chills.   You have chest pain.   You have severe shortness of breath.  You have bloody sputum.   You develop dehydration.  You faint or repeatedly feel like you are going to pass out.  You develop repeated vomiting.  You develop a severe headache. MAKE SURE YOU:   Understand these instructions.  Will watch your condition.  Will get help right away if you are not doing well or get worse. Document Released: 11/30/2004 Document Revised: 03/09/2014 Document Reviewed: 04/15/2013 [employer] Patient Information 2015  [employer]. This information is not intended to replace advice given to you by your health care provider. Make sure you discuss any questions you have with your health care provider.

## 2014-07-28 NOTE — ED Provider Notes (Signed)
CSN: [id number]     Arrival date & time 07/28/14  1612 History   First MD Initiated Contact with Patient 07/28/14 1734     Chief Complaint  Patient presents with  . Chest Pain  . Shortness of Breath     (Consider location/radiation/quality/duration/timing/severity/associated sxs/prior Treatment) The history is provided by the patient and medical records. No language interpreter was used.    [name] is a 63 y.o. female  with a hx of hypothyroidism, HTN,  presents to the Emergency Department complaining of gradual, rapidly recurring chest tightness onset 2 PM today while in the doctor's office with her mother. Patient reports long-standing history of acid reflux and indigestion. She reports having this chest tightness last week and was seen by her primary care physician one week ago, diagnosed with bronchitis and prescribed Mucinex. Patient reports that today she became upset when she was told her mother would need to go to the emergency room and her tightness recurred. It was associated with pain in her left arm that extended from her left wrist her left elbow. She denies radiation of the pain from her chest into her arm. She reports that she has been coughing consistently and coughing makes her chest pain worse in addition to moving however palpation does not worsen the pain. She denies fever, chills, headache, neck pain, abdominal pain, nausea, vomiting diarrhea, diaphoresis, weakness, dizziness, syncope, dysuria, hematuria. Patient reports that she normally takes her blood pressure medication at 5:30 PM.   Patient reports she has seen a cardiologist many years ago for a "abnormal EKG" which was ultimately determined to be secondary to her Prozac.  She also reports being diagnosed with COPD but reports that she cannot take steroids secondary to significant depression.  Past Medical History  Diagnosis Date  . History of MRSA infection 04/28/2011  . Solitary lung nodule 04/08/2011  . Hepatic  steatosis 04/12/2011  . SIGMOID POLYP 01/23/2008  . HYPOTHYROIDISM, BORDERLINE 02/15/2007  . VITAMIN D DEFICIENCY 03/03/2010  . LACTOSE INTOLERANCE 03/10/2008  . HYPERCHOLESTEROLEMIA 12/24/2008  . Hypertension   . Urticaria, chronic 05/22/2011  . Carpal tunnel syndrome 02/14/2007    S/P carpel tunnel release bilaterally.    . Hypothyroidism 02/15/2007    Qualifier: Diagnosis of  By: McDiarmid MD, Sherren Mocha    . Adrenal nodule 04/08/2011    04/08/11 Abdominal CT showed Left adrenal nodule which is tachnically indeterminate but most likely an adenoma.  It is 1.7 cm and 42 HU on portal venous phase image. 31 HU on delayed images.   01/09/2012 Abdominal CT w/ & w/o CM: Stable benign left adrenal adenoma. No further specific follow-up is needed for this finding.    Marland Kitchen Herpes simplex labialis 05/11/2011  . HEMORRHOIDS, NOS 01/03/2007    Qualifier: Diagnosis of  By: Drucie Ip    . Rosacea, acne 10/24/2011   Past Surgical History  Procedure Laterality Date  . Bunionectomy  1992    bilateral feet with pins in great toes  . Bladder tack  1998    Dr Jeffie Pollock (urology)  . Laparoscopy for ectopic pregnancy    . Colonoscopy  2004    Dr Verdia Kuba (GI)  . Cystocele repair  1998     Dr Gertie Fey (GYN)-  . Rectocele repair  1998    Dr Gertie Fey (GYN)  . Esophagogastroduodenoscopy    . Injection hip intra articular  2008    Left Hip fluoroscopically-guided corticosteorid injection for painful osteoarthritis with good effect (06/2007)  . Exploratory  laparotomy    . Facial lacerations repair      Facial Laceration repair as adolescent   . Nm myoview ltd  2011    Dr Daneen Schick, III (Card)  . Carpal tunnel release  2010    Bilateral, Dr Theodis Sato   Family History  Problem Relation Age of Onset  . Diabetes type II    . Hypertension    . Heart attack    . Alcohol abuse    . Depression    . Asthma    . Migraines     History  Substance Use Topics  . Smoking status: Former Smoker -- 1.50 packs/day for 44 years     Types: Cigarettes    Quit date: 04/06/2008  . Smokeless tobacco: Former Systems developer    Quit date: 04/06/2008  . Alcohol Use: No   OB History   Grav Para Term Preterm Abortions TAB SAB Ect Mult Living                 Review of Systems  Constitutional: Negative for fever, diaphoresis, appetite change, fatigue and unexpected weight change.  HENT: Negative for mouth sores.   Eyes: Negative for visual disturbance.  Respiratory: Positive for cough and chest tightness. Negative for shortness of breath and wheezing.   Cardiovascular: Negative for chest pain.  Gastrointestinal: Negative for nausea, vomiting, abdominal pain, diarrhea and constipation.  Endocrine: Negative for polydipsia, polyphagia and polyuria.  Genitourinary: Negative for dysuria, urgency, frequency and hematuria.  Musculoskeletal: Negative for back pain and neck stiffness.  Skin: Negative for rash.  Allergic/Immunologic: Negative for immunocompromised state.  Neurological: Negative for syncope, light-headedness and headaches.  Hematological: Does not bruise/bleed easily.  Psychiatric/Behavioral: Negative for sleep disturbance. The patient is not nervous/anxious.       Allergies  Aripiprazole; Diclofenac-misoprostol; Emsam; Estradiol; Ibuprofen; Naproxen; Neurontin; Paroxetine; Prednisone; and Tape  Home Medications   Prior to Admission medications   Medication Sig Start Date End Date Taking? Authorizing Provider  acetaminophen (TYLENOL) 500 MG tablet Take 1,000 mg by mouth 2 (two) times daily as needed (pain).    Yes Historical Provider, MD  aspirin EC 81 MG tablet Take 81 mg by mouth at bedtime.   Yes Historical Provider, MD  Calcium Carbonate-Vitamin D (CALCIUM 600+D) 600-400 MG-UNIT per tablet Take 1 tablet by mouth daily.    Yes Historical Provider, MD  cyclobenzaprine (FLEXERIL) 10 MG tablet Take one tablet by mouth three times daily as needed for muscle spasm no driving while taking 06/05/14  Yes Blane Ohara McDiarmid, MD   dicyclomine (BENTYL) 10 MG capsule Take 1-2 capsules (10-20 mg total) by mouth 4 (four) times daily -  before meals and at bedtime. 01/29/14  Yes Blane Ohara McDiarmid, MD  fexofenadine (ALLEGRA) 180 MG tablet Take 180 mg by mouth daily.   Yes Historical Provider, MD  fluticasone (FLONASE) 50 MCG/ACT nasal spray Place 2 sprays into both nostrils daily.   Yes Historical Provider, MD  hydrochlorothiazide (HYDRODIURIL) 25 MG tablet Take 1 tablet (25 mg total) by mouth daily. 01/29/14  Yes Blane Ohara McDiarmid, MD  HYDROcodone-acetaminophen (NORCO) 10-325 MG per tablet Take 1 tablet by mouth 3 (three) times daily as needed (pain). Fill 30 days after date on prescription, 05/28/14  Yes Blane Ohara McDiarmid, MD  hydrOXYzine (ATARAX/VISTARIL) 50 MG tablet Take 150 mg by mouth at bedtime. May take one additional  tablet  a day as needed for hives 02/09/12  Yes Blane Ohara McDiarmid, MD  levothyroxine (SYNTHROID, [place])  50 MCG tablet Take 3 tablets (150 mcg total) by mouth daily before breakfast. 03/26/14  Yes Andrena Mews, MD  LORazepam (ATIVAN) 1 MG tablet Take 1 mg by mouth 3 (three) times daily.    Yes Historical Provider, MD  metoprolol (LOPRESSOR) 50 MG tablet Take 50 mg by mouth 2 (two) times daily.  05/28/14  Yes Blane Ohara McDiarmid, MD  morphine (MS CONTIN) 30 MG 12 hr tablet Take 1 tablet (30 mg total) by mouth every 12 (twelve) hours. 05/28/14  Yes Blane Ohara McDiarmid, MD  Multiple Vitamin (MULTIVITAMIN WITH MINERALS) TABS tablet Take 1 tablet by mouth daily.   Yes Historical Provider, MD  omeprazole (PRILOSEC) 40 MG capsule Take 1 capsule (40 mg total) by mouth daily. 01/29/14  Yes Todd D McDiarmid, MD  polyethylene glycol (MIRALAX) powder Take 17 g by mouth every other day.    Yes Historical Provider, MD  potassium chloride (K-DUR) 10 MEQ tablet Take 4 tablets (40 mEq total) by mouth at bedtime. 01/29/14 01/29/15 Yes Todd D McDiarmid, MD  prazosin (MINIPRESS) 1 MG capsule Take 1 capsule (1 mg total) by mouth at bedtime.  05/28/14  Yes Blane Ohara McDiarmid, MD  Probiotic Product (PROBIOTIC PO) Take 1 tablet by mouth daily.   Yes Historical Provider, MD  prochlorperazine (COMPAZINE) 10 MG tablet Take 10 mg by mouth 2 (two) times daily as needed for nausea or vomiting.   Yes Historical Provider, MD  ramipril (ALTACE) 10 MG capsule Take 1 capsule (10 mg total) by mouth daily. 01/29/14  Yes Blane Ohara McDiarmid, MD  TraZODone HCl (OLEPTRO) 300 MG TB24 Take 300 mg by mouth at bedtime.    Yes Historical Provider, MD  vitamin B-12 (CYANOCOBALAMIN) 1000 MCG tablet Take 1,000 mcg by mouth daily.   Yes Historical Provider, MD  azithromycin (ZITHROMAX) 250 MG tablet Take 1 tablet (250 mg total) by mouth daily. Take first 2 tablets together, then 1 every day until finished. 07/28/14   Jaki Hammerschmidt, PA-C  benzonatate (TESSALON) 200 MG capsule Take 1 capsule (200 mg total) by mouth 3 (three) times daily as needed for cough. 07/21/14   Tamela Oddi Hess, DO  nitroGLYCERIN (NITROSTAT) 0.4 MG SL tablet Place 1 tablet (0.4 mg total) under the tongue every 5 (five) minutes as needed for chest pain. 01/29/14   Blane Ohara McDiarmid, MD   BP 149/92  Pulse 71  Temp(Src) 98.1 F (36.7 C) (Oral)  Resp 18  SpO2 97% Physical Exam  Nursing note and vitals reviewed. Constitutional: She appears well-developed and well-nourished. No distress.  Awake, alert, nontoxic appearance  HENT:  Head: Normocephalic and atraumatic.  Mouth/Throat: Oropharynx is clear and moist. No oropharyngeal exudate.  Eyes: Conjunctivae are normal. No scleral icterus.  Neck: Normal range of motion. Neck supple.  Cardiovascular: Normal rate, regular rhythm, normal heart sounds and intact distal pulses.   No murmur heard. Pulmonary/Chest: Effort normal and breath sounds normal. No respiratory distress. She has no wheezes.  Equal chest expansion Slightly diminished breath sounds throughout, no focal wheezes, rhonchi or rales  Abdominal: Soft. Bowel sounds are normal. She  exhibits no mass. There is no tenderness. There is no rebound and no guarding.  Musculoskeletal: Normal range of motion. She exhibits no edema.  Neurological: She is alert.  Speech is clear and goal oriented Moves extremities without ataxia  Skin: Skin is warm and dry. She is not diaphoretic.  Psychiatric: She has a normal mood and affect.    ED Course  Procedures (including  critical care time) Labs Review Labs Reviewed  BASIC METABOLIC PANEL - Abnormal; Notable for the following:    Sodium 136 (*)    Chloride 95 (*)    Glucose, Bld 108 (*)    Anion gap 16 (*)    All other components within normal limits  CBC  PRO B NATRIURETIC PEPTIDE  I-STAT TROPOININ, ED  I-STAT TROPOININ, ED    Imaging Review Dg Chest 2 View  07/28/2014   CLINICAL DATA:  Substernal chest pain radiating into the left upper extremity. Diagnosis of bronchitis approximately 1 week ago.  EXAM: CHEST  2 VIEW  COMPARISON:  Two-view chest x-ray 09/18/2012, 2/20 04/1010.  FINDINGS: Cardiac silhouette normal in size, unchanged. Thoracic aorta mildly atherosclerotic, unchanged. Hilar and mediastinal contours otherwise unremarkable. Mildly prominent bronchovascular markings and mild central peribronchial thickening, unchanged. Lungs otherwise clear. No localized airspace consolidation. No pleural effusions. No pneumothorax. Normal pulmonary vascularity. Mild degenerative changes involving the thoracic spine. No significant interval change.  IMPRESSION: Stable mild changes of chronic bronchitis and/or asthma. No acute cardiopulmonary disease.   Electronically Signed   By: Evangeline Dakin M.D.   On: 07/28/2014 17:11     EKG Interpretation   Date/Time:  Tuesday July 28 2014 16:19:59 EDT Ventricular Rate:  81 PR Interval:  150 QRS Duration: 86 QT Interval:  352 QTC Calculation: 408 R Axis:   82 Text Interpretation:  Normal sinus rhythm Nonspecific ST and T wave  abnormality Since last tracing 18 Sep 2012 T wave  inversion now evident in  Septal leads Confirmed by KNAPP  MD-I, IVA ([id number]) on 07/28/2014 5:24:50 PM      MDM   Final diagnoses:  Chest pain, unspecified chest pain type  Shortness of breath  Cough  Anxiety   [name] presents with chest pain today after becoming upset that her mother was ill and after having viral URI symptoms for greater than one week with significant cough.  Patient's pain has resolved at this time she had no associated symptoms of nausea, diaphoresis or near syncope.  Patient without history of CAD or MI. Will give albuterol, check labs and reassess.  10:44 PM Patient reports feeling significantly better after her albuterol treatment.  Her vitals have remained stable here in the emergency department and she has been without hypoxia.  Chest pain is not likely of cardiac or pulmonary etiology d/t presentation, PERC negative, VSS, no tracheal deviation, no JVD or new murmur, RRR, breath sounds equal bilaterally, EKG without acute abnormalities, negative troponin x2, and negative CXR. Pt has been advised to return to the ED if CP becomes exertional, associated with diaphoresis or nausea, radiates to left jaw/arm, worsens or becomes concerning in any way. Patient is to followup with her primary care provider within 3 days for further evaluation. Pt appears reliable for follow up and is agreeable to discharge. Patient will be discharged with azithromycin, albuterol inhaler and without steroids due to her reported significant adverse reactions.  Blood pressure 140/79, pulse 73, temperature 98 F (36.7 C), temperature source Oral, resp. rate 18, SpO2 93.00%.  Case has been discussed with Dr. Rolland Porter who agrees with the above plan to discharge.    Jarrett Soho Philip Eckersley, PA-C 07/28/14 2246

## 2014-07-28 NOTE — Progress Notes (Unsigned)
Pt in clinic with mom; mom has appt today.  Pt stating left arm pain, chest burning, belching, SOB; pt very flushed.  Pt denies headache, SOB,visual changes or dizziness.  Blood pressure 180/100, heart rate 78-80, O2 stat 98-97%, temp 98.9.  Precepted with Dr. Gwendlyn Deutscher; pt needs to be seen; no appts at [facility]; pt advised to go to urgent care or ED.  Pt stated understanding.  Derl Barrow, RN

## 2014-07-28 NOTE — ED Notes (Addendum)
Sunday: substernal cp; today - pain radiating down left arm.  pcp stated, "chronic bronchitis;" taking muscinex. "burbing in triage." states, "more of chest pressure."

## 2014-07-28 NOTE — ED Provider Notes (Signed)
Medical screening examination/treatment/procedure(s) were performed by non-physician practitioner and as supervising physician I was immediately available for consultation/collaboration.   EKG Interpretation   Date/Time:  Tuesday July 28 2014 16:19:59 EDT Ventricular Rate:  81 PR Interval:  150 QRS Duration: 86 QT Interval:  352 QTC Calculation: 408 R Axis:   82 Text Interpretation:  Normal sinus rhythm Nonspecific ST and T wave  abnormality Since last tracing 18 Sep 2012 T wave inversion now evident in  Septal leads Confirmed by [place]  MD-I, Samanthajo Payano ([id number]) on 07/28/2014 5:24:50 PM      Rolland Porter, MD, Alanson Aly, MD 07/28/14 2251

## 2014-08-17 ENCOUNTER — Other Ambulatory Visit: Payer: Self-pay

## 2014-08-17 DIAGNOSIS — Z1231 Encounter for screening mammogram for malignant neoplasm of breast: Secondary | ICD-10-CM

## 2014-08-20 ENCOUNTER — Other Ambulatory Visit: Payer: Self-pay | Admitting: Family Medicine

## 2014-08-20 ENCOUNTER — Ambulatory Visit (INDEPENDENT_AMBULATORY_CARE_PROVIDER_SITE_OTHER): Payer: Federal, State, Local not specified - PPO | Admitting: Family Medicine

## 2014-08-20 VITALS — BP 140/78 | HR 85 | Temp 98.3°F | Wt 258.0 lb

## 2014-08-20 DIAGNOSIS — Z23 Encounter for immunization: Secondary | ICD-10-CM | POA: Diagnosis not present

## 2014-08-20 DIAGNOSIS — E038 Other specified hypothyroidism: Secondary | ICD-10-CM

## 2014-08-20 DIAGNOSIS — K30 Functional dyspepsia: Secondary | ICD-10-CM

## 2014-08-20 DIAGNOSIS — I10 Essential (primary) hypertension: Secondary | ICD-10-CM | POA: Diagnosis not present

## 2014-08-20 DIAGNOSIS — M160 Bilateral primary osteoarthritis of hip: Secondary | ICD-10-CM

## 2014-08-20 DIAGNOSIS — R7309 Other abnormal glucose: Secondary | ICD-10-CM

## 2014-08-20 DIAGNOSIS — F3342 Major depressive disorder, recurrent, in full remission: Secondary | ICD-10-CM

## 2014-08-20 DIAGNOSIS — K929 Disease of digestive system, unspecified: Secondary | ICD-10-CM

## 2014-08-20 DIAGNOSIS — J3089 Other allergic rhinitis: Secondary | ICD-10-CM

## 2014-08-20 DIAGNOSIS — R7303 Prediabetes: Secondary | ICD-10-CM

## 2014-08-20 DIAGNOSIS — E8881 Metabolic syndrome: Secondary | ICD-10-CM

## 2014-08-20 DIAGNOSIS — G2581 Restless legs syndrome: Secondary | ICD-10-CM

## 2014-08-20 DIAGNOSIS — M47816 Spondylosis without myelopathy or radiculopathy, lumbar region: Secondary | ICD-10-CM

## 2014-08-20 DIAGNOSIS — J449 Chronic obstructive pulmonary disease, unspecified: Secondary | ICD-10-CM

## 2014-08-20 DIAGNOSIS — G4761 Periodic limb movement disorder: Secondary | ICD-10-CM

## 2014-08-20 LAB — TSH: TSH: 1.045 u[IU]/mL (ref 0.350–4.500)

## 2014-08-20 LAB — POCT GLYCOSYLATED HEMOGLOBIN (HGB A1C): Hemoglobin A1C: 5.9

## 2014-08-20 MED ORDER — HYDROCODONE-ACETAMINOPHEN 10-325 MG PO TABS: 1.0000 | ORAL_TABLET | Freq: Three times a day (TID) | ORAL | Status: DC | PRN

## 2014-08-20 MED ORDER — MORPHINE SULFATE ER 30 MG PO TBCR: 30.0000 mg | EXTENDED_RELEASE_TABLET | Freq: Two times a day (BID) | ORAL | Status: DC

## 2014-08-20 MED ORDER — NITROGLYCERIN 0.4 MG SL SUBL: 0.4000 mg | SUBLINGUAL_TABLET | SUBLINGUAL | Status: DC | PRN

## 2014-08-20 MED ORDER — ALBUTEROL SULFATE HFA 108 (90 BASE) MCG/ACT IN AERS: 2.0000 | INHALATION_SPRAY | Freq: Four times a day (QID) | RESPIRATORY_TRACT | Status: DC | PRN

## 2014-08-20 NOTE — Patient Instructions (Signed)
Good blood pressure control 

## 2014-08-21 ENCOUNTER — Encounter: Payer: Self-pay | Admitting: Family Medicine

## 2014-08-21 NOTE — Progress Notes (Signed)
Subjective:    Patient ID: [name], female    DOB: [birth year], 63 y.o.   MRN: [id number]  HPI Problem List Items Addressed This Visit     Medium   RESTLESS LEGS SYNDROME - longstanding problem - Tx'd with as needed vicodan with fair response. - Intermittent interference with falling asleep.  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  - Remaining active, though not taking exercise - Fragmented sleep pattern     HYPERTENSION, BENIGN SYSTEMIC Disease Monitoring  Blood pressure range: not checking at home  Chest pain: no   Dyspnea: no   Claudication: no   Medication compliance: yes, taking metoprolol, Ramipril and HCTZ Medication Side Effects  Lightheadedness: no   Urinary frequency: no   Edema: no   Cough: no  Preventitive Healthcare:  Exercise: no   Diet Pattern: high in fat and simple carbohydrates  Salt Restriction: no     Functional gastrointestinal disorder (Chronic) - Longstanding problem: Functional Gastrointestinal Disorder with frequent eructation and bloating sensation with acute flares.  Responsive to Compazine and Bentyl.  Takes a few days to calm down. - Taking dicyclomine 10 mg cap three times a day - Recent flare with increased nausea and belching with COPD exacerbation - Eating well, mixed diarrhea and constipation present - Not interfering with social or occupational roles.      Low   Periodic limb movements of sleep - Documented on Sleep Study Fall 2011 - Patient's Fit Bit shows frequent movement activity during sleep.   Pt with frequent awakenings at night. - Taking the Vicodin at night improves the quality of her sleep in general.  - Occasional daytime napping. Little sunlight exposure during day.  - Hips can ache at night interfering with sleep.    Hypothyroidism (Chronic) - Longstanding problem - taking Levothyroxine daily 50 mcg tab, three tablets before breakfast.    Relevant Orders      TSH (Completed)   COPD WITHOUT EXACERBATION - Longstanding problem.  Pt quit smoking 6 years ago.  Is not around second hand smoke. - Had AE-COPD requiring ED visit 07/28/14 that was treated with albuterol neb and oral Azithromycin.  - Pt still with lingering cough, non-productive. - Used albuterol MDI last week once for SHORTNESS OF BREATH - Denies sputum production, no fever, no chest discomfort.  - Able to sleep without shortness of breath or cough.  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      Relevant Medications      triamcinolone (NASACORT) 55 MCG/ACT AERO nasal inhaler      albuterol (PROAIR HFA) 108 (90 BASE) MCG/ACT inhaler     Unprioritized   Pre-diabetes - Primary - Diagnosed in 2010. - Not on medications for condition.  Pt not participating in diet and exercise for prevention of progression - No polyuria, no unintentional weight loss - Obesity is disconcerting to patient.  She wants to participate in water aerobics at [facility] but has found paperwork for scholarship a barrei   Relevant Orders      POCT A1C (Completed)   Metabolic syndrome (Chronic)  Hip osteoarthritis MRI Pelvis (03/13/07) Bilateral osteoarthritis of the hips, left greater than right.  The  patient has a slightly more prominent hip effusion on the left than  the right.  No other significant abnormality. 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  - Takes Vicodan during day on days she is working.  - Taking around two  vicodan a day on average.  - Other than constipation, denies adverse effects from opiates, including problems with cognition and balance.    Relevant Medications      HYDROcodone-acetaminophen (NORCO) 10-325 MG per tablet      morphine (MS CONTIN) 12 hr tablet    Other Visit Diagnoses   Need for pneumococcal vaccination        Relevant Orders       Pneumococcal polysaccharide vaccine 23-valent greater than or equal to 2yo subcutaneous/IM (Completed)      No smoking   Review of Systems See HPI    Objective:   Physical Exam VS reviewed GEN: Alert, Cooperative, Groomed, NAD, truncal obesity COR: RRR, No M/G/R, No JVD, Normal PMI size and location LUNGS: BCTA, No Acc mm use, speaking in full sentences ABDOMEN: (+)BS, soft, NT, ND, No HSM, No palpable masses EXT: No peripheral leg edema. Feet without deformity or lesions. Palpable bilateral pedal pulses.  SKIN: No lesion nor rashes of face/trunk/extremities Neuro: Oriented to person, place, and time;  Gait: Normal speed, No significant path deviation, Step through +,  Psych: Normal affect/thought/speech/language    Assessment & Plan:

## 2014-08-21 NOTE — Assessment & Plan Note (Signed)
Lab Results  Component Value Date   HGBA1C 5.9 08/20/2014  Stable. Discusses role of aerobic exercise, e.g. Water exercise, and limitation of simple sugars in diet. Pt not interested in nutrition counseling at this time.

## 2014-08-21 NOTE — Assessment & Plan Note (Signed)
Fair control of symptom Patient concerned that her Fit Bit watch showing activity in her sleep that may represent PLMS Could discuss use of Requip but concern that this patient with Function GI disorder may not tolerate the GI side effects of this medication.  Patient's current sleep pattern is adequate for patient to participate in her usual social and occupational activities.

## 2014-08-21 NOTE — Assessment & Plan Note (Signed)
Stable Continue Ibuprofe as needed, Scheduled MS Contin 30 TWICE DAILY and as needed vicodan

## 2014-08-21 NOTE — Assessment & Plan Note (Signed)
Adequate symptom control with Vicodan as needed for exacerbations.  Tolerating opiate. Plan to

## 2014-08-21 NOTE — Assessment & Plan Note (Signed)
Asymptomatic Tolerating Levothyroxine 150 mcg daily Lab Results  Component Value Date   TSH 1.045 08/20/2014   Adequate laboratory thyroid replacement.  Continue Levothyroxine 50 mcg tab, three tablet daily.

## 2014-08-21 NOTE — Assessment & Plan Note (Signed)
Improved symptoms Using Nasacort (otc) Plan to continue therapy

## 2014-08-21 NOTE — Assessment & Plan Note (Signed)
Stable. Continue Ibuprofe as needed, Scheduled MS Contin 30 TWICE DAILY and as needed vicodan

## 2014-08-21 NOTE — Assessment & Plan Note (Signed)
Stable COPD Grade A to B.  Start AS NEEDED Albuterol MDI

## 2014-08-24 ENCOUNTER — Encounter: Payer: Self-pay | Admitting: Family Medicine

## 2014-08-25 ENCOUNTER — Other Ambulatory Visit: Payer: Self-pay | Admitting: Family Medicine

## 2014-08-25 MED ORDER — MUPIROCIN CALCIUM 2 % NA OINT: 1.0000 "application " | TOPICAL_OINTMENT | Freq: Two times a day (BID) | NASAL | Status: DC

## 2014-08-25 NOTE — Progress Notes (Signed)
Please let patient know prescription for nasal antibiotic gel sent to her [facility].

## 2014-08-25 NOTE — Progress Notes (Signed)
Pt is aware.  [name],CMA  

## 2014-08-25 NOTE — Progress Notes (Signed)
Pt came in with mom today and states that you had discussed giving her medication for the sores in her nose.  She is still having this problem and would like to try a cream if possible.  Siris Hoos,CMA

## 2014-08-27 ENCOUNTER — Other Ambulatory Visit: Payer: Self-pay | Admitting: *Deleted

## 2014-08-27 MED ORDER — MUPIROCIN CALCIUM 2 % NA OINT: TOPICAL_OINTMENT | NASAL | Status: DC

## 2014-09-03 ENCOUNTER — Other Ambulatory Visit: Payer: Self-pay | Admitting: Family Medicine

## 2014-09-03 NOTE — Telephone Encounter (Signed)
Pt called and needs a refill on her Synthroid called in to the [facility]. The pharmacy sent her e-mail and telling her that there are no more refill left. jw

## 2014-09-04 MED ORDER — LEVOTHYROXINE SODIUM 50 MCG PO TABS: 150.0000 ug | ORAL_TABLET | Freq: Every day | ORAL | Status: DC

## 2014-09-10 ENCOUNTER — Ambulatory Visit
Admission: RE | Admit: 2014-09-10 | Discharge: 2014-09-10 | Disposition: A | Discharge status: Home / Self Care | Payer: Federal, State, Local not specified - PPO | Source: Ambulatory Visit

## 2014-09-10 DIAGNOSIS — Z1231 Encounter for screening mammogram for malignant neoplasm of breast: Secondary | ICD-10-CM

## 2014-10-05 ENCOUNTER — Telehealth: Payer: Self-pay | Admitting: Family Medicine

## 2014-10-05 MED ORDER — HYDROCODONE-ACETAMINOPHEN 10-325 MG PO TABS: 1.0000 | ORAL_TABLET | Freq: Three times a day (TID) | ORAL | Status: DC | PRN

## 2014-10-05 NOTE — Telephone Encounter (Signed)
    I spoke with pharmacist at [facility] at [facility] and canceled the [place] 10/325 Disp#30 dated 08/20/14 to fill in 60 days from date of prescription.  Pt will come by [facility] in next day or two to pick up a prescription for Norco 10/325, disp #45 tab to be filled on or after 10/07/14. Patient informed to pick up Rx for Norco in next couple days at [facility] front desk.

## 2014-10-05 NOTE — Telephone Encounter (Signed)
Pt called because her hydrocodone prescription is written wrong. She said it should be qty of 45 and take twice a day if needed. Please call in a correction or leave a prescription up front. jw

## 2014-11-10 ENCOUNTER — Other Ambulatory Visit: Payer: Self-pay | Admitting: *Deleted

## 2014-11-10 ENCOUNTER — Encounter: Payer: Self-pay | Admitting: Family Medicine

## 2014-11-10 NOTE — Progress Notes (Signed)
See refill encounter.  [name],CMA  

## 2014-11-10 NOTE — Progress Notes (Signed)
Patient requests refill for Norco 10-325 mg and Morphine Sulfate er 30mg . Please follow up with Patient.

## 2014-11-10 NOTE — Telephone Encounter (Signed)
Pt needs a refill of her medication. Lunabella Badgett,CMA

## 2014-11-13 MED ORDER — MORPHINE SULFATE ER 30 MG PO TBCR: 60.0000 mg | EXTENDED_RELEASE_TABLET | Freq: Two times a day (BID) | ORAL | Status: DC

## 2014-11-13 MED ORDER — HYDROCODONE-ACETAMINOPHEN 10-325 MG PO TABS: 1.0000 | ORAL_TABLET | Freq: Three times a day (TID) | ORAL | Status: DC | PRN

## 2014-11-13 NOTE — Telephone Encounter (Signed)
Pt is aware that rxs are ready for pick up. [name],CMA

## 2014-11-19 ENCOUNTER — Ambulatory Visit: Payer: Federal, State, Local not specified - PPO | Admitting: Family Medicine

## 2014-12-03 ENCOUNTER — Encounter: Payer: Self-pay | Admitting: Family Medicine

## 2014-12-03 ENCOUNTER — Ambulatory Visit (INDEPENDENT_AMBULATORY_CARE_PROVIDER_SITE_OTHER): Payer: Federal, State, Local not specified - PPO | Admitting: Family Medicine

## 2014-12-03 ENCOUNTER — Ambulatory Visit: Payer: Federal, State, Local not specified - PPO | Admitting: Family Medicine

## 2014-12-03 VITALS — BP 124/76 | HR 86 | Temp 98.0°F | Ht 64.0 in | Wt 261.0 lb

## 2014-12-03 DIAGNOSIS — F331 Major depressive disorder, recurrent, moderate: Secondary | ICD-10-CM | POA: Diagnosis not present

## 2014-12-03 DIAGNOSIS — M160 Bilateral primary osteoarthritis of hip: Secondary | ICD-10-CM

## 2014-12-03 DIAGNOSIS — G8929 Other chronic pain: Secondary | ICD-10-CM | POA: Insufficient documentation

## 2014-12-03 DIAGNOSIS — G2581 Restless legs syndrome: Secondary | ICD-10-CM | POA: Diagnosis not present

## 2014-12-03 MED ORDER — HYDROCODONE-ACETAMINOPHEN 10-325 MG PO TABS: 1.0000 | ORAL_TABLET | Freq: Three times a day (TID) | ORAL | Status: DC | PRN

## 2014-12-03 MED ORDER — MORPHINE SULFATE ER 30 MG PO TBCR: 60.0000 mg | EXTENDED_RELEASE_TABLET | Freq: Two times a day (BID) | ORAL | Status: DC

## 2014-12-03 MED ORDER — MORPHINE SULFATE ER 30 MG PO TBCR: 30.0000 mg | EXTENDED_RELEASE_TABLET | Freq: Two times a day (BID) | ORAL | Status: DC

## 2014-12-03 NOTE — Patient Instructions (Signed)
Restart your pain medications.   We will see you back in one week to see how you are feeling. Restart your [name] as soon as possible.   You may decrease your aspirin to 2 times a week.

## 2014-12-04 NOTE — Assessment & Plan Note (Signed)
Worsened chronic problem Suspect much of the worsening in mood and vegetative symptoms are due to patient not taking Oleptro because it was not covered by her insureer. Dr reddy has gotten approval for [facility] and it should be available for [facility] by next week. In interim she is taking Trazodone 300 mg at bedtime which does not appear to work as well as the [employer].  Patient appears safe.  She will contact either our office or Dr Reddy's office if this changes.  She also has the number for emergency services.  I will see the patient back next week to assess her.

## 2014-12-04 NOTE — Progress Notes (Signed)
Patient ID: [name], female   DOB: September 10, 1951, 64 y.o.   MRN: [id number]   Subjective:    Patient ID: [name], female    DOB: 08-09-1951, 64 y.o.   MRN: [id number]  HPI Problem List Items Addressed This Visit     Medium   RESTLESS LEGS SYNDROME - longstanding problem - Tx'd with as needed vicodan with fair response. - Intermittent interference with falling asleep.  Improves with use of Vicodan, - (+) constipation controled with Miralax    Major depressive disorder, recurrent episode - Longstanding problem - Acutely worsened since running out of Oleptro. - Tearful, forgetful, unable to concentrate, poor sleep, worsening pain in legs and back, lack of energy going mostly from bed to couch. 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 - Has been seeking her therapist.   - Denies thoughts of self-harm - [name] is supposed to be delivered to her in about a week.  Dr Reece Levy had to make an appeal for this medication to her insurer.  She is hopeful that this current relapse will remit with restarting the [facility].   - PHQ9: 22  With extreme difficulty with iADLs.    Hip osteoarthritis MRI Pelvis (03/13/07) Bilateral osteoarthritis of the hips, left greater than right.  The  patient has a slightly more prominent hip effusion on the left than  the right.  No other significant abnormality. - Has not taken MS Contin in last day because she has had difficulty remembering if she took a dose or not.   - taking MS Contin for both hip osteoarthritis pain, chronic pain syndrome, osteoarthritis of lumbar spine - Increased her Vicodan to three tablets a day with exacerbation of pain.   - Other than constipation, denies adverse effects from opiates, including problems with cognition and  balance.  - [place] [facility] was reviewed and showed no prescriptions for opiates from any other provider other than myself.  [name] has had an initial evaluation at [facility] (11/30/14) and will be starting multimodal physical therapies on2/1/16.  -Chronic Pain Follow-Up Assessment Chronic Pain Diagnosis: Hip osteoarthtitis, lumbar spondylosis Pain scale rating at its BEST in the past 24 hours (1 to 10):  Pain scale rating at its WORST in the past 24 hours (1 to 10): 9.5 Adverse Effects:  (None__ Nausea__ Vomiting__ Confusion__ Sleepiness__ Fatigue_X_ Constipation__ Other__) Treatment of Adverse Effects: Since the last clinic visit, how much relief have pain treatment and medication provided? Please circle the one percentage that shows how much relief you have received: 0%__ 10%__ 20%__ 30%_X_ 40%__ 50%__ 60%__ 70%__ 80%__ 90%__ 100%__ [name] the one number that describes how, during the past 24 hours, pain has interfered with your: General Activity 0__ 1__ 2__ 3__ 4__ 5__ 6__ 7__ 8_X_ 9__ 10__ Does not interfere      Completely interferes Mood 0__ 1__ 2__ 3_X_ 4__ 5__ 6__ 7__ 8__ 9__ 10__ Does not interfere      Completely interferes Ability to work (in or out of the home) 0__ 1__ 2__ 3__ 4__ 5__ 6__ 7__ 8__ 9_X_ 10__ Does not interfere      Completely interferes Interactions with other people 0__ 1__ 2__ 3__ 4__ 5__ 6__ 7__ 8_X_ 9__ 10__ Does not interfere      Completely interferes  Sleep 0__ 1__ 2__ 3__ 4__ 5__ 6__ 7__ 8_X_ 9__ 10__ Does not interfere      Completely interferes  Enjoyment of life 0__ 1__ 2__ 3__ 4__ 5__ 6__ 7__ 8__ 9_X_ 10__ Does not interfere      Completely interferes   No smoking   Review of Systems See HPI    Objective:   Physical Exam VS reviewed GEN: Tearful, complaining of pain all over.  COR: RRR, No M/G/R, No JVD, Normal PMI size and location LUNGS: BCTA, No Acc mm use, speaking in full sentences ABDOMEN: (+)BS, soft, NT, ND, No HSM, No  palpable masses EXT: No peripheral leg edema. Feet without deformity or lesions. Palpable bilateral pedal pulses.  SKIN: No lesion nor rashes of face/trunk/extremities Neuro: Oriented to person, place, and time;  Gait: Slow gait with slow rise from chair.  Psych: Normal affect/thought/speech/language    Assessment & Plan:

## 2014-12-04 NOTE — Assessment & Plan Note (Signed)
Inadequate pain control currently. Suspect much of her generalized limb and back pain are related to her depression relapse. Review of [place] [facility] showed no evidence of doctor shopping.  Patient coping with the constipation. 3 Rx for MS Contin 30 mg #60 now, 30 days and 60 days from date of prescription 3 Rx for Hydrocodone-APAP 10/325 #50 for now, in 30 days and in 60 days from date of prescription.   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 Patient is engaging in PT thru [facility].  She will be getting back on her effective antidepressant therapy. I placed her out of work for 4 weeks with a reassessment to decide if she may return to work before the end of those four weeks.

## 2014-12-04 NOTE — Assessment & Plan Note (Signed)
Stable chronic problem Controlled with use of Vicodan prn

## 2014-12-10 ENCOUNTER — Ambulatory Visit (INDEPENDENT_AMBULATORY_CARE_PROVIDER_SITE_OTHER): Payer: Federal, State, Local not specified - PPO | Admitting: Family Medicine

## 2014-12-10 ENCOUNTER — Encounter: Payer: Self-pay | Admitting: Family Medicine

## 2014-12-10 VITALS — BP 130/74 | HR 60 | Temp 98.7°F | Ht 64.0 in | Wt 259.0 lb

## 2014-12-10 DIAGNOSIS — K30 Functional dyspepsia: Secondary | ICD-10-CM | POA: Diagnosis not present

## 2014-12-10 DIAGNOSIS — Z7189 Other specified counseling: Secondary | ICD-10-CM

## 2014-12-10 DIAGNOSIS — F330 Major depressive disorder, recurrent, mild: Secondary | ICD-10-CM

## 2014-12-10 DIAGNOSIS — G2581 Restless legs syndrome: Secondary | ICD-10-CM | POA: Diagnosis not present

## 2014-12-10 DIAGNOSIS — G8929 Other chronic pain: Secondary | ICD-10-CM

## 2014-12-10 DIAGNOSIS — K929 Disease of digestive system, unspecified: Secondary | ICD-10-CM

## 2014-12-10 MED ORDER — MORPHINE SULFATE ER 30 MG PO TBCR: EXTENDED_RELEASE_TABLET | ORAL | Status: DC

## 2014-12-10 NOTE — Patient Instructions (Signed)
Decrease your MS contin to one tablet in evening and half tablet in morning.  I am glad your are feeling better.

## 2014-12-11 ENCOUNTER — Encounter: Payer: Self-pay | Admitting: Family Medicine

## 2014-12-11 NOTE — Assessment & Plan Note (Signed)
More active recently Has needed to take Vicodan one tab at night last two nights because of RLLS with good response. Doubt patient would be tolerant of a Dopaminergic agent given her Function GI disorder.

## 2014-12-11 NOTE — Assessment & Plan Note (Signed)
Flaring up above baseline last week. Increase in Eructation and mixed constipation / diarrhea.  Taking Bentyl 3 times a day with adequate response.  Has not had to take a compazine for nausea.  Feeling better this today  Recommend continue current therapies which she is tolerating.

## 2014-12-11 NOTE — Assessment & Plan Note (Signed)
Improved mood and energy. Participating in PT and Nutrition and Massage at [facility]. Starting Atkins diet.  Oleptro not available for patient.  Taking Trazodone 300 mg at night.  Participating in counseling.  No thoughts of self harm.  No changes recommended. 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

## 2014-12-11 NOTE — Progress Notes (Signed)
Patient ID: [name], female   DOB: 02-16-1951, 64 y.o.   MRN: [id number]   Subjective:    Patient ID: [name], female    DOB: 04/04/1951, 64 y.o.   MRN: [id number]  HPI Problem List Items Addressed This Visit     Medium   RESTLESS LEGS SYNDROME - longstanding problem - Tx'd with as needed vicodan with fair response. - Intermittent interference with falling asleep.  Improves with use of Vicodan, She has had to take 1 Vicodan for last couple nights because of her RLS acting up.  - (+) constipation controled with Miralax    Major depressive disorder, recurrent episode - Longstanding problem - Improved today.  Began working with [facility] PT and Nutrition which she is finding helpful - Unfortunately, she was unable to obtain the [facility] from [employer] as she thought.  Uncertain the reason for this.  - Has been approved for 4 weeks off from work.  - Not tearful, Is happy to be starting an Atkins diet thru [facility].  This has been succeful for her in the past.  - She is interested in starting to decrease her MS Contin.  - The severe pain she was experiencing last week throughout her body has lessened considerably -  Continues treatment with Trazodone 300 mg bedtime, Prazosin, Hydroxyzine and Ativan per Dr Reece Levy (Psych) - Denies thoughts of self harm. - Has been seeking her therapist.   - Denies thoughts of self-harm -   - PHQ9: 8 (down from 22 last week)   Much improved in ability to perform her iADLs.    No smoking tobacco.    Review of Systems See HPI    Objective:   Physical Exam VS reviewed GEN: Bright affect, socially conversant, no tearfulness  Gait: Slow gait but steady, no drifting.   Psych: Normal affect/thought/speech/language    Assessment & Plan:  25 minutes spent in office visit with over half spent in counseling.

## 2014-12-11 NOTE — Assessment & Plan Note (Signed)
Ms [name] is interested in trying to "get off" her MS Contin.  To that end, we agreed to decrease her MS Contin to 15 mg in morning and 30 mg in the evening for one month.  If she tolerates this, will next step down to 15 mg MS Contin TWICE DAILY.

## 2015-01-07 ENCOUNTER — Other Ambulatory Visit: Payer: Self-pay | Admitting: Family Medicine

## 2015-01-07 ENCOUNTER — Ambulatory Visit (INDEPENDENT_AMBULATORY_CARE_PROVIDER_SITE_OTHER): Payer: Federal, State, Local not specified - PPO | Admitting: Family Medicine

## 2015-01-07 ENCOUNTER — Encounter: Payer: Self-pay | Admitting: Family Medicine

## 2015-01-07 VITALS — BP 110/66 | HR 66 | Temp 97.9°F | Wt 252.4 lb

## 2015-01-07 DIAGNOSIS — M160 Bilateral primary osteoarthritis of hip: Secondary | ICD-10-CM

## 2015-01-07 DIAGNOSIS — I10 Essential (primary) hypertension: Secondary | ICD-10-CM

## 2015-01-07 DIAGNOSIS — G2581 Restless legs syndrome: Secondary | ICD-10-CM | POA: Diagnosis not present

## 2015-01-07 MED ORDER — PROCHLORPERAZINE MALEATE 10 MG PO TABS: 10.0000 mg | ORAL_TABLET | Freq: Two times a day (BID) | ORAL | Status: DC | PRN

## 2015-01-07 NOTE — Patient Instructions (Signed)
Your blood pressure is good.  Continue taking your blood pressure medication. Congratulation on your further weight loss We will talk about decreasing your MS Contin dose next visit.

## 2015-01-08 ENCOUNTER — Encounter: Payer: Self-pay | Admitting: Family Medicine

## 2015-01-08 NOTE — Assessment & Plan Note (Signed)
Adequate blood pressure control.  No evidence of new end organ damage.  Tolerating medication without significant adverse effects.  Plan to continue current blood pressure regiment.   

## 2015-01-08 NOTE — Assessment & Plan Note (Signed)
Chronic Improved Using vicodan less frequently at bedtime each week.

## 2015-01-08 NOTE — Progress Notes (Signed)
Patient ID: [name], female   DOB: 02/02/1951, 64 y.o.   MRN: [id number]   Subjective:    Patient ID: [name], female    DOB: 06-03-1951, 64 y.o.   MRN: [id number]  HPI Problem List Items Addressed This Visit     Medium   RESTLESS LEGS SYNDROME - longstanding problem - Tx'd with as needed vicodan with fair response. - Intermittent interference with falling asleep.  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   [name] tolerated the decrease of her MS Contin to 30 mg at night and 15 mg in morning. - The severe pain she was experiencing last week throughout her body has lessened considerably -  Continues treatment with Trazodone 300 mg bedtime, Prazosin, Hydroxyzine and Ativan per Dr Reece Levy (Psych) - Denies thoughts of self harm. - Has been seeking her therapist.   - Denies thoughts of self-harm -   - PHQ9: 9 (down from 22 last week, about the same as two weeks ago)   Much improved in ability to perform her iADLs.    No smoking tobacco.    Review of Systems See HPI    Objective:   Physical Exam VS reviewed GEN: Bright affect, socially conversant, no tearfulness  Gait: Slow gait but steady, no drifting.   Psych: Normal affect/thought/speech/language    Assessment & Plan:  .

## 2015-01-08 NOTE — Assessment & Plan Note (Signed)
Chronic Improved Participating in PT at [facility] Tolerated decrease in MS contin to 15 mg in morning and 30 mg at night. Will discuss reduction to MSA Contin 15 mg twice daily next office visit in 6 weeks.

## 2015-02-02 ENCOUNTER — Encounter: Payer: Self-pay | Admitting: Family Medicine

## 2015-02-02 NOTE — Progress Notes (Signed)
Clinic portion completed and placed in providers box. [name],CMA  

## 2015-02-02 NOTE — Progress Notes (Signed)
Needs form completed so that she may see the therapist. Please fax to [phone] when complete / thanks Fonda Kinder, ASA

## 2015-02-03 NOTE — Progress Notes (Signed)
Form faxed to [phone].  Derl Barrow, RN

## 2015-02-03 NOTE — Progress Notes (Signed)
Medicaid treatment Medicaid authorization order for psychotherapy for MDD (recurrent) signed and given to Latina Craver, RN.

## 2015-02-24 ENCOUNTER — Other Ambulatory Visit: Payer: Self-pay | Admitting: Family Medicine

## 2015-03-04 ENCOUNTER — Encounter: Payer: Self-pay | Admitting: Family Medicine

## 2015-03-04 ENCOUNTER — Ambulatory Visit (INDEPENDENT_AMBULATORY_CARE_PROVIDER_SITE_OTHER): Payer: Federal, State, Local not specified - PPO | Admitting: Family Medicine

## 2015-03-04 VITALS — BP 114/80 | HR 88 | Temp 97.8°F | Ht 64.0 in | Wt 256.0 lb

## 2015-03-04 DIAGNOSIS — G2581 Restless legs syndrome: Secondary | ICD-10-CM | POA: Diagnosis not present

## 2015-03-04 DIAGNOSIS — Z119 Encounter for screening for infectious and parasitic diseases, unspecified: Secondary | ICD-10-CM

## 2015-03-04 DIAGNOSIS — F330 Major depressive disorder, recurrent, mild: Secondary | ICD-10-CM | POA: Diagnosis not present

## 2015-03-04 DIAGNOSIS — Z114 Encounter for screening for human immunodeficiency virus [HIV]: Secondary | ICD-10-CM | POA: Diagnosis not present

## 2015-03-04 DIAGNOSIS — G894 Chronic pain syndrome: Secondary | ICD-10-CM

## 2015-03-04 MED ORDER — MORPHINE SULFATE ER 15 MG PO TBCR: 15.0000 mg | EXTENDED_RELEASE_TABLET | Freq: Two times a day (BID) | ORAL | Status: DC

## 2015-03-04 MED ORDER — HYDROCODONE-ACETAMINOPHEN 10-325 MG PO TABS: 1.0000 | ORAL_TABLET | Freq: Three times a day (TID) | ORAL | Status: DC | PRN

## 2015-03-05 ENCOUNTER — Encounter: Payer: Self-pay | Admitting: Family Medicine

## 2015-03-05 LAB — HEPATITIS C ANTIBODY: HCV Ab: NEGATIVE

## 2015-03-05 LAB — HIV ANTIBODY (ROUTINE TESTING W REFLEX): HIV 1&2 Ab, 4th Generation: NONREACTIVE

## 2015-03-05 NOTE — Assessment & Plan Note (Signed)
Stable. Adequate symptom control. Tolerating Vicodan medication at bedtime. Continue current medication regiment.

## 2015-03-05 NOTE — Assessment & Plan Note (Signed)
Adequate symptom control with reduced dose of MS Contin to 15 mg twice a day and 2 - 3 Vicodan for RLL and hip pain at work. Continue current medication regiment. MS Contin 15 mg twice daily #60 now, in 30 days, and in 60 days Vicodan 5 mg #50 now, in 30 days and in 60 days.  RTC in 3 months where consideration to further titration off of the longacting MS Contin should be considered.

## 2015-03-05 NOTE — Assessment & Plan Note (Signed)
Stable improved mood and depression-related symptoms on Brintillex from Dr Reece Levy. Pt has returned to work.  She feels like she is performing well at work.

## 2015-03-05 NOTE — Progress Notes (Signed)
Patient ID: [name], female   DOB: Mar 14, 1951, 64 y.o.   MRN: [id number]   Subjective:    Patient ID: [name], female    DOB: [birth year], 64 y.o.   MRN: [id number]  Hypertension   Problem List Items Addressed This Visit     Medium   RESTLESS LEGS SYNDROME - longstanding problem - Tx'd with as needed vicodan with fair response. - Intermittent interference with falling asleep.  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  She enjoys the social aspect of the job.  - Treatment with Trazodone  bedtime, Prazosin, Hydroxyzine and Ativan per Dr Reece Levy (Psych) - Denies thoughts of self harm, denies falls and clumsiness, denies excess sedation. - Has been seeing her therapist with good sessions   - Denies thoughts of self-harm -  - PHQ9: 5  (03/04/15).    Hip osteoarthritis MRI Pelvis (03/13/07) Bilateral osteoarthritis of the hips, left greater than right.  The  patient has a slightly more prominent hip effusion on the left than  the right.  No other significant abnormality. -  [name] is down to half tablet of MS Contin 30 mg tablet twice a day with adequate pain control of hip  osteoarthritis pain, chronic pain syndrome, osteoarthritis of lumbar spine - Vicodan to two to three tablets a day with exacerbation of pain while at work.   - Other than constipation, denies adverse effects from opiates, including problems with cognition and balance.  -- [name] has had no more PT session at [facility] as her Insurance coverage has been meet for the year. .      No smoking   Review of Systems See HPI    Objective:   Physical Exam VS reviewed GEN: Tearful, complaining of pain all over.  COR: RRR, No M/G/R, No  JVD, Normal PMI size and location LUNGS: BCTA, No Acc mm use, speaking in full sentences ABDOMEN: (+)BS, soft, NT, ND, No HSM, No palpable masses EXT: No peripheral leg edema. Feet without deformity or lesions. Palpable bilateral pedal pulses.  SKIN: No lesion nor rashes of face/trunk/extremities Neuro: Oriented to person, place, and time;  Gait: Slow gait with slow rise from chair.  Psych: Normal affect/thought/speech/language    Assessment & Plan:

## 2015-03-17 ENCOUNTER — Other Ambulatory Visit: Payer: Self-pay | Admitting: Family Medicine

## 2015-03-26 ENCOUNTER — Other Ambulatory Visit: Payer: Self-pay | Admitting: Family Medicine

## 2015-04-11 ENCOUNTER — Other Ambulatory Visit: Payer: Self-pay | Admitting: Family Medicine

## 2015-04-25 ENCOUNTER — Telehealth: Payer: Self-pay | Admitting: Family Medicine

## 2015-04-25 NOTE — Telephone Encounter (Signed)
Family Medicine Emergency Line Telephone Note  I returned a call to [name], a patient of Dr. McDiarmid's, at [phone] to discuss her main complaint of diarrhea, vomiting, and bloating. Diarrhea is worst symptom, beginning early 6/18, continuing all day, without blood or mucous, actually resolved with pepto-bismol last night. 1 episode of NBNB emesis since that time. Nothing else tried. Has been unable to take her regular medications x2 days, but has tolerated saltine crackers and ice today. No fevers, suspicious ingestions, sick contacts, recent travel, change in bowel habits. Feels abdominal pain described as bloating. No history of IBD but has had IBS.   We discussed the importance of remaining hydrated, taking po, and that there is no indication based on these symptoms to present emergently for medical care. She would like to be seen tomorrow in clinic, so I have scheduled an appointment with Dr. Berkley Harvey Monday, 6/20 at 2:15pm. She is to call back if she is unable to take po or with any significant change in symptoms occurs.   Terresa Marlett B. Bonner Puna, MD, PGY-2 04/25/2015 8:43 AM

## 2015-04-26 ENCOUNTER — Ambulatory Visit (INDEPENDENT_AMBULATORY_CARE_PROVIDER_SITE_OTHER): Payer: Federal, State, Local not specified - PPO | Admitting: Family Medicine

## 2015-04-26 ENCOUNTER — Encounter: Payer: Self-pay | Admitting: Family Medicine

## 2015-04-26 VITALS — BP 146/75 | HR 67 | Temp 97.7°F | Wt 249.0 lb

## 2015-04-26 DIAGNOSIS — R112 Nausea with vomiting, unspecified: Secondary | ICD-10-CM | POA: Diagnosis not present

## 2015-04-26 DIAGNOSIS — R197 Diarrhea, unspecified: Secondary | ICD-10-CM | POA: Diagnosis not present

## 2015-04-26 MED ORDER — ONDANSETRON HCL 4 MG PO TABS: 4.0000 mg | ORAL_TABLET | Freq: Three times a day (TID) | ORAL | Status: DC | PRN

## 2015-04-26 MED ORDER — LOPERAMIDE HCL 2 MG PO CAPS: 2.0000 mg | ORAL_CAPSULE | ORAL | Status: DC | PRN

## 2015-04-26 NOTE — Patient Instructions (Signed)
It was great seeing you today.   1. Take imodium 4mg  when you get home, then take 2 mg after each loose stools up to 16 mg a day 2. Take prochlorperazine for nausea as prescribed. Then that doesn't work take zofran 4 mg every 6 hours as needed for nausea.  3. Continue to stay hydrated with water, ice chips and low calorie Gatorade or vitamin water  4. If you develop high fevers 102, blood in stool or vomit, or are unable to keep any liquids down for > 8 + hours and are feeling dehydrated call or return to clinic   If you have any questions or concerns before then, please call the clinic at [phone].  Take Care,   Dr Phill Myron

## 2015-04-26 NOTE — Assessment & Plan Note (Addendum)
Acute N/V/D x 3 days with some improvement. No signs of dehydration or acute abdomen. Likely viral gastroenteritis complicated by underlying IBS - Symptomatic treatment with Zofran and Imodium  - See AVS for return precautions

## 2015-04-26 NOTE — Progress Notes (Signed)
   Subjective:    Patient ID: [name], female    DOB: 1951-10-26, 64 y.o.   MRN: [id number]  Seen for Same day visit for   CC: N/V/D  She reports acute onset of nausea, vomiting and diarrhea Friday, after spending the day with her grandson with similar symptoms.  Has been unable to keep any food down since then, but is hydrated with water and ice chips.  Symptoms have begun to improve.  Denies any blood in vomit or diarrhea.  Also endorses mild diffuse abdominal pain and bloating.  Reports recent stressors that have exacerbated her underlying IBS.  Previous history of hysterectomy and BTL.   Review of Systems   See HPI for ROS. Objective:  BP 146/75 mmHg  Pulse 67  Temp(Src) 97.7 F (36.5 C) (Oral)  Wt 249 lb (112.946 kg)  General: NAD HEENT: Mucus membranes slightly dehydrated Cardiac: RRR, normal heart sounds, no murmurs. 2+ radial pulses bilaterally Respiratory: CTAB, normal effort Abdomen: soft, nontender, nondistended Bowel sounds present Extremities: no edema or cyanosis. WWP. Skin: warm and dry, no rashes noted Neuro: alert and oriented, no focal deficits    Assessment & Plan:  See Problem List Documentation

## 2015-04-28 NOTE — Progress Notes (Signed)
I was available as preceptor to resident for this patient's office visit.  

## 2015-05-15 ENCOUNTER — Other Ambulatory Visit: Payer: Self-pay | Admitting: Family Medicine

## 2015-05-20 ENCOUNTER — Other Ambulatory Visit: Payer: Self-pay | Admitting: Family Medicine

## 2015-06-13 ENCOUNTER — Other Ambulatory Visit: Payer: Self-pay | Admitting: Family Medicine

## 2015-06-17 ENCOUNTER — Encounter: Payer: Self-pay | Admitting: Family Medicine

## 2015-06-17 ENCOUNTER — Ambulatory Visit (INDEPENDENT_AMBULATORY_CARE_PROVIDER_SITE_OTHER): Payer: Federal, State, Local not specified - PPO | Admitting: Family Medicine

## 2015-06-17 VITALS — BP 111/71 | HR 78 | Resp 16 | Wt 256.6 lb

## 2015-06-17 DIAGNOSIS — I10 Essential (primary) hypertension: Secondary | ICD-10-CM | POA: Diagnosis not present

## 2015-06-17 DIAGNOSIS — Z79899 Other long term (current) drug therapy: Secondary | ICD-10-CM

## 2015-06-17 DIAGNOSIS — E039 Hypothyroidism, unspecified: Secondary | ICD-10-CM | POA: Diagnosis not present

## 2015-06-17 DIAGNOSIS — E7800 Pure hypercholesterolemia, unspecified: Secondary | ICD-10-CM

## 2015-06-17 DIAGNOSIS — R7309 Other abnormal glucose: Secondary | ICD-10-CM | POA: Diagnosis not present

## 2015-06-17 DIAGNOSIS — G2581 Restless legs syndrome: Secondary | ICD-10-CM

## 2015-06-17 DIAGNOSIS — G894 Chronic pain syndrome: Secondary | ICD-10-CM

## 2015-06-17 DIAGNOSIS — R7303 Prediabetes: Secondary | ICD-10-CM

## 2015-06-17 LAB — POCT GLYCOSYLATED HEMOGLOBIN (HGB A1C): Hemoglobin A1C: 6.1

## 2015-06-17 MED ORDER — HYDROCODONE-ACETAMINOPHEN 10-325 MG PO TABS: 1.0000 | ORAL_TABLET | Freq: Three times a day (TID) | ORAL | Status: DC | PRN

## 2015-06-17 MED ORDER — HYDROCODONE-ACETAMINOPHEN 10-325 MG PO TABS
1.0000 | ORAL_TABLET | Freq: Three times a day (TID) | ORAL | Status: DC | PRN
Start: 2015-06-17 — End: 2015-10-07

## 2015-06-17 MED ORDER — MORPHINE SULFATE ER 15 MG PO TBCR: 15.0000 mg | EXTENDED_RELEASE_TABLET | Freq: Two times a day (BID) | ORAL | Status: DC

## 2015-06-17 NOTE — Patient Instructions (Signed)
I do like the the involvement in [place].

## 2015-06-18 ENCOUNTER — Encounter: Payer: Self-pay | Admitting: Family Medicine

## 2015-06-18 LAB — LIPID PANEL
Cholesterol: 210 mg/dL — ABNORMAL HIGH (ref 125–200)
HDL: 40 mg/dL — AB (ref >=46)
LDL Cholesterol: 98 mg/dL (ref <130)
Total CHOL/HDL Ratio: 5.3 Ratio — ABNORMAL HIGH (ref <=5.0)
Triglycerides: 359 mg/dL — ABNORMAL HIGH (ref <150)
VLDL: 72 mg/dL — ABNORMAL HIGH (ref <30)

## 2015-06-18 LAB — COMPREHENSIVE METABOLIC PANEL
ALBUMIN: 4.2 g/dL (ref 3.6–5.1)
ALT: 45 U/L — ABNORMAL HIGH (ref 6–29)
AST: 26 U/L (ref 10–35)
Alkaline Phosphatase: 64 U/L (ref 33–130)
BUN: 12 mg/dL (ref 7–25)
CALCIUM: 9.6 mg/dL (ref 8.6–10.4)
CHLORIDE: 98 mmol/L (ref 98–110)
CO2: 27 mmol/L (ref 20–31)
Creat: 0.78 mg/dL (ref 0.50–0.99)
Glucose, Bld: 110 mg/dL — ABNORMAL HIGH (ref 65–99)
POTASSIUM: 4.4 mmol/L (ref 3.5–5.3)
Sodium: 137 mmol/L (ref 135–146)
Total Bilirubin: 0.4 mg/dL (ref 0.2–1.2)
Total Protein: 7.1 g/dL (ref 6.1–8.1)

## 2015-06-18 LAB — TSH: TSH: 0.154 u[IU]/mL — ABNORMAL LOW (ref 0.350–4.500)

## 2015-06-18 NOTE — Assessment & Plan Note (Signed)
Stable. Adequate symptom control. Tolerating Vicodan medication at bedtime. Continue current medication regiment.

## 2015-06-18 NOTE — Progress Notes (Signed)
Patient ID: [name], female   DOB: [birth year], 64 y.o.   MRN: [id number]   Subjective:    Patient ID: [name], female    DOB: 1951/06/01, 64 y.o.   MRN: [id number]  Hypertension   Problem List Items Addressed This Visit     Medium   RESTLESS LEGS SYNDROME - longstanding problem - Tx'd with as needed vicodan with fair response. Using about 3 times a week.  - Intermittent interference with falling asleep.  Improves with use of Vicodan, - (+) constipation controled with Miralax    Major depressive disorder, recurrent episode - Longstanding problem - Wants to discuss trying other agent than Brillintix because of nausea when she takes it. She is taking it only 3 to 4 times a week bc of this nausea.  - decline in mood, appetite, self-esteem, attention, motivation with her niece in process of dying from metastatic cervical cancer.  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   - Treatment with Trazodone  bedtime, Prazosin, Hydroxyzine and Ativan per Dr Reece Levy (Psych) - Denies thoughts of self harm, denies falls and clumsiness, denies excess sedation. - Has been seeing her therapist    - Denies thoughts of self-harm    Hip osteoarthritis and Chronic Pain Syndrome - Longstanding issue - pain has worsened in her back since stress of her dying nieces -  [name] is taking MS Contin 15 mg tablet twice a day with fair pain control of hip  osteoarthritis pain, chronic pain syndrome, osteoarthritis of lumbar spine - Vicodan to two a day with exacerbation of pain while at work.   - Other than constipation, denies adverse effects from opiates, including problems with cognition and balance.  - She is not taking any formal exercise. - She is looking after to of her grandchildren    No smoking   Review of Systems See HPI    Objective:   Physical Exam VS reviewed GEN: Tearful, complaining of pain all over.  COR:  RRR, No M/G/R, No JVD, Normal PMI size and location LUNGS: BCTA, No Acc mm use, speaking in full sentences ABDOMEN: (+)BS, soft, NT, ND, No HSM, No palpable masses EXT: No peripheral leg edema. Feet without deformity or lesions. Palpable bilateral pedal pulses.  SKIN: No lesion nor rashes of face/trunk/extremities Neuro: Oriented to person, place, and time;  Gait: Slow gait with slow rise from chair.  Psych: Normal affect/thought/speech/language    Assessment & Plan:

## 2015-06-18 NOTE — Assessment & Plan Note (Signed)
Checking lipid panel today Decide about need for statin

## 2015-06-18 NOTE — Assessment & Plan Note (Signed)
Adequate blood pressure control.  No evidence of new end organ damage.  Tolerating medication without significant adverse effects.  Plan to continue current blood pressure regiment.   

## 2015-06-18 NOTE — Assessment & Plan Note (Signed)
Adequate symptom control with reduced dose of MS Contin to 15 mg twice a day and 2 - 3 Vicodan for RLL and hip pain at work. Continue current medication regiment. MS Contin 15 mg twice daily #60 now, in 30 days, and in 60 days Vicodan 5 mg #50 now, in 30 days and in 60 days.  RTC in 3 months where consideration to further titration off of the longacting MS Contin should be considered.

## 2015-06-18 NOTE — Assessment & Plan Note (Signed)
Lab Results  Component Value Date   HGBA1C 6.1 06/17/2015  Stable Gaining weight Declined MNT referral at this time.

## 2015-06-18 NOTE — Assessment & Plan Note (Signed)
Asymptomatic Tolerating Levothyroxine 150 mcg daily Lab Results  Component Value Date   TSH 0.154* 06/17/2015    Excess replacement will reduce to 125 mcg daily then recheck TSH in 6 weeks.   Continue Levothyroxine 50 mcg tab, three tablet daily.

## 2015-06-21 ENCOUNTER — Telehealth: Payer: Self-pay | Admitting: Family Medicine

## 2015-06-21 DIAGNOSIS — E039 Hypothyroidism, unspecified: Secondary | ICD-10-CM

## 2015-06-21 NOTE — Telephone Encounter (Signed)
Pt is returning the doctors call. She will be home after 6:15 today. jw

## 2015-06-21 NOTE — Telephone Encounter (Signed)
Will forward to MD. [name],CMA  

## 2015-06-22 NOTE — Telephone Encounter (Signed)
I spoke with [name]  I asked yher to decrease her levothyroxine 50 mcg tablet, from 3 tablets daily to 2.5 tablets daily tbc her TSH was depressed beyond normal range. Will check TSH on next ov with me.

## 2015-09-03 ENCOUNTER — Other Ambulatory Visit: Payer: Self-pay | Admitting: Family Medicine

## 2015-09-08 ENCOUNTER — Other Ambulatory Visit: Payer: Self-pay

## 2015-09-08 DIAGNOSIS — Z1231 Encounter for screening mammogram for malignant neoplasm of breast: Secondary | ICD-10-CM

## 2015-09-09 ENCOUNTER — Encounter: Payer: Self-pay | Admitting: Family Medicine

## 2015-09-09 ENCOUNTER — Ambulatory Visit (INDEPENDENT_AMBULATORY_CARE_PROVIDER_SITE_OTHER): Payer: Federal, State, Local not specified - PPO | Admitting: Family Medicine

## 2015-09-09 VITALS — BP 142/76 | HR 69 | Temp 98.4°F | Ht 64.0 in | Wt 253.2 lb

## 2015-09-09 DIAGNOSIS — G5602 Carpal tunnel syndrome, left upper limb: Secondary | ICD-10-CM

## 2015-09-09 DIAGNOSIS — E039 Hypothyroidism, unspecified: Secondary | ICD-10-CM

## 2015-09-09 DIAGNOSIS — G894 Chronic pain syndrome: Secondary | ICD-10-CM

## 2015-09-09 DIAGNOSIS — I10 Essential (primary) hypertension: Secondary | ICD-10-CM

## 2015-09-09 DIAGNOSIS — K929 Disease of digestive system, unspecified: Secondary | ICD-10-CM

## 2015-09-09 DIAGNOSIS — F330 Major depressive disorder, recurrent, mild: Secondary | ICD-10-CM | POA: Diagnosis not present

## 2015-09-09 DIAGNOSIS — L259 Unspecified contact dermatitis, unspecified cause: Secondary | ICD-10-CM

## 2015-09-09 DIAGNOSIS — K30 Functional dyspepsia: Secondary | ICD-10-CM

## 2015-09-09 LAB — TSH: TSH: 0.404 u[IU]/mL (ref 0.350–4.500)

## 2015-09-09 MED ORDER — HYDROCODONE-ACETAMINOPHEN 5-300 MG PO TABS: 1.0000 | ORAL_TABLET | Freq: Four times a day (QID) | ORAL | Status: DC

## 2015-09-09 MED ORDER — HYDROCODONE-ACETAMINOPHEN 10-325 MG PO TABS: 1.0000 | ORAL_TABLET | Freq: Three times a day (TID) | ORAL | Status: DC | PRN

## 2015-09-10 DIAGNOSIS — L259 Unspecified contact dermatitis, unspecified cause: Secondary | ICD-10-CM | POA: Insufficient documentation

## 2015-09-10 NOTE — Assessment & Plan Note (Signed)
Established problem slightly worsened. Changed to Celexa from [place] by Dr Reece Levy few weejks ago No thoughts of self harm. Continue current therapy

## 2015-09-10 NOTE — Progress Notes (Signed)
Patient ID: [name], female   DOB: 1951-09-30, 64 y.o.   MRN: [id number]   Subjective:    Patient ID: [name], female    DOB: 05/27/1951, 64 y.o.   MRN: [id number]  Abdominal Pain  Rash  Hypertension   Problem List Items Addressed This Visit     Medium   Belching HPI Duration: 2 months Alleviating factors: compazine and bentyl Aggravating Factors: stress from work and deaths in family Severity or functional limitations: interfering with work Associated symptoms: nausea Pattern: daily Course: stable Context: known function gastrointestinal disorder     Major depressive disorder, recurrent episode - Longstanding problem - stopped Brillintix because of nausea when she takes it.  - Dr Reece Levy started her on celexa 20 mg daily 3 to 4 weeks ago.  - stable mood, appetite,-  - Treatment with Trazodone  bedtime, Prazosin, Hydroxyzine and Ativan per Dr Reece Levy (Psych) - Denies thoughts of self harm, denies falls and clumsiness, denies excess sedation. - Has been seeing her therapist    - Denies thoughts of self-harm    Hip osteoarthritis and Chronic Pain Syndrome - Longstanding issue - pain has worsened in her back since stress of her dying nieces -  [name] is taking MS Contin 15 mg tablet twice a day with fair pain control of hip  osteoarthritis pain, chronic pain syndrome, osteoarthritis of lumbar spine - Vicodan to two a day with exacerbation of pain while at work.   - Other than constipation, denies adverse effects from opiates, including problems with cognition and balance.  - She is not taking any formal exercise. - She is looking after to of her grandchildren    RASH  Location: just above umbilicus Onset: 2 weeks ago  Course: stable  Self-treated with: triple antibiotic ointment             Improvement with treatment: slightly  History Pruritis: yes  Tenderness: no  New medications/antibiotics: no  Tick/insect/pet exposure: no  Recent travel: no  New  detergent, new clothing, or other topical exposure: no   Red Flags Feeling ill: no  Fever: no  Mouth lesions: no  Facial/tongue swelling/difficulty breathing:  no  Diabetic or immunocompromised: no   Paresthesia in fingers HPI Duration: last several weeks Alleviating factors: none Aggravating Factors: during sleep Severity or functional limitations: none Associated symptoms: no weakness in hand Quality: tingling sensation Pattern: continuos Course: stable  Location: first three digits of left hand Radiation: soreness in proximal right dorsal forearm Context: History of bilateral carpel tunnel treated with bilateral carpel tunnel release.  Hypothyroidism Longstanding issue for patient Patient presents for evaluation of thyroid function.  Symptoms consist of none currently.  The problem has been unchanged.   Previous thyroid studies include TSH which was slightly higher than normal range  The hypothyroidism is due to autoimmune hypothyroidism.  No smoking   Review of Systems  Gastrointestinal: Positive for abdominal pain.  Skin: Positive for rash.  No fever No headache No neck pain See HPI    Objective:   Physical Exam VS reviewed GEN: Sad affect, cooperative, frequent belching COR: RRR, No M/G/R, No JVD, Normal PMI size and location LUNGS: BCTA, No Acc mm use, speaking in full sentences ABDOMEN: (+)BS, soft, NT, ND, No HSM, No palpable masses EXT: trace bilateral ankle edema.  SKIN: grouped ~ 2-3 mm vesciles/pustules  with surrounding erythematous skin, group about 3 cm in diameter, non-ender Neuro: Oriented to person, place, and time;  Gait: Slow gait with  slow rise from chair.  Psych: Normal affect/thought/speech/language    Assessment & Plan:

## 2015-09-10 NOTE — Assessment & Plan Note (Signed)
Established problem worsened.  Flare up of chronic condition with increased belching and nausea.  Maintining oral intake adequately.  Continue Bentyl and prn compazine

## 2015-09-10 NOTE — Assessment & Plan Note (Signed)
Established problem that is stable. No evidence of aberrant drug seeking behaviors No significant adverse effects  Patient wants to try to continue titrating off the opiates Plan:  Stop MS Contin 15 mg bid Start Hydrocodone/APAP 5/300 mg tablet, one tab four times a day Continue use of prn Hydrocodone 10/325 tab, one tab prn breakthrough pain. Marland Kitchen   Rxs for Hydrocodone/APAP 5/300 mg tablet for now, in 30 days and in 60 days from today  Rxs for  Hydrocodone 10/325 tablets for now, in 30 days and in 60 days from today

## 2015-09-10 NOTE — Assessment & Plan Note (Signed)
New problem No further workup Probable allergic contact dermatitis.  Other possible localize herpes simplex outbreak, but not painful  Stop topice Neomycin Start OTC hydrocortisoen 1% daily to site

## 2015-09-10 NOTE — Assessment & Plan Note (Signed)
Established problem. Stable. Continue current therapy Checking TSH today on 125 mcg daily.

## 2015-09-10 NOTE — Assessment & Plan Note (Signed)
Adequate blood pressure control.  No evidence of new end organ damage.  Tolerating medication without significant adverse effects.  Plan to continue current blood pressure regiment.   

## 2015-09-13 ENCOUNTER — Encounter: Payer: Self-pay | Admitting: Family Medicine

## 2015-09-21 ENCOUNTER — Other Ambulatory Visit: Payer: Self-pay | Admitting: Family Medicine

## 2015-09-22 ENCOUNTER — Other Ambulatory Visit: Payer: Self-pay | Admitting: Family Medicine

## 2015-10-07 ENCOUNTER — Other Ambulatory Visit: Payer: Self-pay | Admitting: Family Medicine

## 2015-10-07 ENCOUNTER — Ambulatory Visit (INDEPENDENT_AMBULATORY_CARE_PROVIDER_SITE_OTHER): Payer: Federal, State, Local not specified - PPO | Admitting: Family Medicine

## 2015-10-07 ENCOUNTER — Encounter: Payer: Self-pay | Admitting: Family Medicine

## 2015-10-07 VITALS — BP 138/80 | HR 63 | Temp 97.9°F | Ht 64.0 in | Wt 254.5 lb

## 2015-10-07 DIAGNOSIS — Z23 Encounter for immunization: Secondary | ICD-10-CM

## 2015-10-07 DIAGNOSIS — F331 Major depressive disorder, recurrent, moderate: Secondary | ICD-10-CM | POA: Diagnosis not present

## 2015-10-07 DIAGNOSIS — G894 Chronic pain syndrome: Secondary | ICD-10-CM | POA: Diagnosis not present

## 2015-10-07 DIAGNOSIS — G2581 Restless legs syndrome: Secondary | ICD-10-CM | POA: Diagnosis not present

## 2015-10-07 MED ORDER — HYDROCODONE-ACETAMINOPHEN 10-325 MG PO TABS: 1.0000 | ORAL_TABLET | Freq: Three times a day (TID) | ORAL | Status: DC | PRN

## 2015-10-07 MED ORDER — HYDROCODONE-ACETAMINOPHEN 5-300 MG PO TABS: 1.0000 | ORAL_TABLET | Freq: Four times a day (QID) | ORAL | Status: DC

## 2015-10-07 NOTE — Assessment & Plan Note (Signed)
Established problem worsened.  No thoughts of self harm. Continue Celexa, ativan, trazodone and hydroxyzine No falls Pt's continuing weekly counseling Pt's next appointment with Dr Reece Levy is in April 2017.

## 2015-10-07 NOTE — Progress Notes (Signed)
Patient ID: [name], female   DOB: 1951/01/08, 64 y.o.   MRN: [id number]   Subjective:    Patient ID: [name], female    DOB: Jan 04, 1951, 64 y.o.   MRN: [id number]  Back Pain  Hypertension   Problem List Items Addressed This Visit     Medium   RESTLESS LEGS SYNDROME - longstanding problem - Tx'd with as needed vicodan 10 mg with fair response. Using about 4 times a week.  - Intermittent interference with falling asleep.  Improves with use of Vicodan 10 mg, - No constipation any longer since stop MS Contin.      Major depressive disorder, recurrent episode - Longstanding problem - Stopped Brintillax because of nausea.  Dr Reece Levy started celexa 20 mg daily.  She has been on it for about one month now - decline in mood, appetite, self-esteem, attention, and motivation  - intrusive thoughts about dead loved ones.    She has been unable to work at [employer] because of depression. She does not believe she can return to her job.  [employer] is allowing her to take a leave of absence.  [name] would like a doctor note stating that her medical/psychiatric condition precludes her working effectively for now.    - Treatment with Celexa, Trazodone  bedtime, Prazosin, Hydroxyzine and Ativan per Dr Reece Levy (Psych) - Denies thoughts of self harm, denies falls and clumsiness, denies excess sedation. - Has been seeing her therapist    - Denies thoughts of self-harm    Hip osteoarthritis and Chronic Pain Syndrome - Longstanding issue - [name] is no longer taking MS Contin. n She is taking schedule Vicodan 5/325 tab, one tab QID.  She takes a [place] 10/325 when her restless leg is bothering her which is about 4 times a week. .  -  -- Since stopping the MS Contin she has been having frequent loose, nonpainful, non-bloody- - She is no longer looking after to of her grandchildren    No smoking   Review of Systems  Musculoskeletal: Positive for back pain.   See HPI    Objective:   Physical  Exam VS reviewed GEN: Tearful, not talking about pain, talking more about feelings today. .  COR: RRR, No M/G/R, No JVD, Normal PMI size and location Neuro: Oriented to person, place, and time;  Gait: Slow rise from chair, but faster than last ov.  Gait is slightly wide but in straight line with feet in alignment to dirction of travel.  Full step through bilatterallyu Psych: Normal affect/thought/speech/language    Assessment & Plan:  30 minutes face to face where spent in total with counseling care with  more than 50% of the total time. Counseling involved discussion of stressors and thoughts on work, family, and death.  BATHE technique used for ego support of patient.

## 2015-10-07 NOTE — Assessment & Plan Note (Signed)
Established problem that is stable. No evidence of aberrant drug seeking behaviors No significant adverse effects other than diarrhea from planned withdrawal from MS Contin  Continue Hydrocodone/APAP 5/300 mg tablet, one tab four times a day.  One month RX given Continue use of prn Hydrocodone 10/325 tab, one tab prn breakthrough pain or restless leg exacerbation. One month Rx given.

## 2015-10-07 NOTE — Patient Instructions (Signed)
Given the stress you are currently dealing with, we will hold off reducing your pain medications for now. We will follow you closely to assess your emotional and physical ability sufficient to return to work.

## 2015-10-07 NOTE — Assessment & Plan Note (Signed)
Established problem. Stable. Adequate symptom control. Tolerating Vicodan 10/325 medication at bedtime. Continue current medication regiment.

## 2015-10-14 ENCOUNTER — Ambulatory Visit: Payer: Federal, State, Local not specified - PPO

## 2015-11-04 ENCOUNTER — Ambulatory Visit
Admission: RE | Admit: 2015-11-04 | Discharge: 2015-11-04 | Disposition: A | Discharge status: Home / Self Care | Payer: Federal, State, Local not specified - PPO | Source: Ambulatory Visit

## 2015-11-04 DIAGNOSIS — Z1231 Encounter for screening mammogram for malignant neoplasm of breast: Secondary | ICD-10-CM

## 2015-11-11 ENCOUNTER — Ambulatory Visit (INDEPENDENT_AMBULATORY_CARE_PROVIDER_SITE_OTHER): Payer: Federal, State, Local not specified - PPO | Admitting: Family Medicine

## 2015-11-11 ENCOUNTER — Encounter: Payer: Self-pay | Admitting: Family Medicine

## 2015-11-11 VITALS — BP 134/65 | HR 56 | Temp 98.3°F | Wt 253.1 lb

## 2015-11-11 DIAGNOSIS — G894 Chronic pain syndrome: Secondary | ICD-10-CM

## 2015-11-11 DIAGNOSIS — Z7189 Other specified counseling: Secondary | ICD-10-CM | POA: Diagnosis not present

## 2015-11-11 DIAGNOSIS — G8929 Other chronic pain: Secondary | ICD-10-CM

## 2015-11-11 DIAGNOSIS — I10 Essential (primary) hypertension: Secondary | ICD-10-CM | POA: Diagnosis not present

## 2015-11-11 DIAGNOSIS — F330 Major depressive disorder, recurrent, mild: Secondary | ICD-10-CM | POA: Diagnosis not present

## 2015-11-11 MED ORDER — HYDROCODONE-ACETAMINOPHEN 5-300 MG PO TABS: 1.0000 | ORAL_TABLET | Freq: Four times a day (QID) | ORAL | Status: DC

## 2015-11-11 MED ORDER — HYDROCODONE-ACETAMINOPHEN 10-325 MG PO TABS: 1.0000 | ORAL_TABLET | Freq: Three times a day (TID) | ORAL | Status: DC | PRN

## 2015-11-11 MED ORDER — LOPERAMIDE HCL 2 MG PO CAPS: 2.0000 mg | ORAL_CAPSULE | Freq: Four times a day (QID) | ORAL | Status: DC

## 2015-11-11 NOTE — Patient Instructions (Signed)
Take Imodium four times a day to help decrease explosive diarrhea.   Try the TENS unit on your back.

## 2015-11-12 ENCOUNTER — Encounter: Payer: Self-pay | Admitting: Family Medicine

## 2015-11-12 NOTE — Assessment & Plan Note (Signed)
Established problem Controlled Consider addition of prn FDGard (caraway and peppermint plant oils) capsule for functional dyspepsia when it exacerbates.  Will discuss with patient on next ov.

## 2015-11-12 NOTE — Assessment & Plan Note (Signed)
Established problem Controlled Heart rate is on lower end of normal into bradycardic. If continues, will decrease beta-blocker dose and reassess.  Continue current HCTZ 25 mg daily and Metoprolol 25 mg bid.

## 2015-11-12 NOTE — Assessment & Plan Note (Addendum)
Established problem that has improved.  Depression screen [facility] 2/9 11/12/2015 09/09/2015 03/04/2015 12/10/2014 12/03/2014  Decreased Interest 1 2 1  0 3  Down, Depressed, Hopeless 1 2 0 0 3  PHQ - 2 Score 2 4 1  0 6  Altered sleeping 1 1 - 1 3  Tired, decreased energy 2 3 - 1 3  Change in appetite 1 2 - 1 3  Feeling bad or failure about yourself  1 1 - 0 3  Trouble concentrating 2 1 - 0 3  Moving slowly or fidgety/restless 0 0 - 0 0  Suicidal thoughts 0 0 - 0 1  PHQ-9 Score 9 12 - 3 22  Difficult doing work/chores Somewhat difficult Extremely dIfficult - - -  Now on Citalopram for 2 months with evidence of improved mood. Still with some impairment of her energy, endurance, and concentration.  Plan: continue on citalopram started by Dr Reece Levy.  She has follow up with him this Summer.            Doctor note stating that patient's condition still requires leave of abscence.  Will readdress this question about returning to work on next ov in a month.

## 2015-11-12 NOTE — Progress Notes (Signed)
   Subjective:    Patient ID: [name], female    DOB: Nov 25, 1950, 65 y.o.   MRN: [id number] [name] is alone Sources of clinical information for visit is/are patient and past medical records. Nursing assessment for this office visit was reviewed with the patient for accuracy and revision.   HPI  Problem List Items Addressed This Visit      Major depressive disorder, recurrent episode ([place]) - Primary -Longstanding problem - Stopped Brintillax because of nausea. Dr Reece Levy started celexa 20 mg daily. She has been on it for about two months - Some Improvement in mood, and motivation. Energy and concentration remain below her usual level.   - Episodes of tearfulness that she was experiencing have resolved.  - decrease in  intrusive thoughts about dead loved ones.  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 She continues to feel that she would be able to sustain work for an entire shift. [employer] is allowing her to take a leave of absence. Antoinett would like a repeat doctor note stating that her medical/psychiatric condition continues to precludes her working effectively..  - Treatment with Celexa, Trazodone bedtime, Prazosin, Hydroxyzine and Ativan per Dr Reece Levy (Psych) - Denies thoughts of self harm, denies falls and clumsiness, denies excess sedation. - Has been seeing her therapist, and will see her next week  - Denies thoughts of self-harm   HYPERTENSION, BENIGN SYSTEMIC - Not checking at home - No chest pain, no limb or facial weakness/ numbness - Not taking exercise because of her pains.  Reports that she would like to get out more.  - Worries about her weight gain.    Chronic pain syndrome - Longstanding issue: low back, bilateral hips pain - She remains off MS Contin. n She is taking schedule Vicodan 5/325 tab, one tab QID. She takes a [place] 10/325 when her restless leg is bothering her which is about 2 times a week. She has good  symptom control with this. -  - She is able to do her own ADLs and iADLs which she says she does not think she could do without the Vicodan 5/325 QID scheduled.  She is able to sleep on nights when her RLS is active when she takes the prn Vicodan 10/325.  -- Since stopping the MS Contin she has continued to  having explosive, loose, nonpainful, non-bloody diarrheal events that cause her to avoid social activities and looking after to of her grandchildren.  While she has tried Imodium tablets for the events, she only takes 1-2 tablets in a day after an event.  - She has just started back taking Probiotic.      SH: Smokes Marijuana right before bed daily.  She reports improves that this improves her functional dyspepsia symptoms and anxiety.    Review of Systems See HPI No fever No vomiting.    Objective:   Physical Exam VS reviewed. Gen: Talking about recent stressors but without crying, socially engaging, smiling at times appropriately Gait: Able to rise from chair without using her arms, Gait is at an acceptable normal pace without deviation aobut axis of line of travel. Normal station.  Neuro: Alert, active, cooperative Psych: Normal affect/thought/speech and languase.         Assessment & Plan:

## 2015-12-09 ENCOUNTER — Ambulatory Visit (INDEPENDENT_AMBULATORY_CARE_PROVIDER_SITE_OTHER): Payer: Federal, State, Local not specified - PPO | Admitting: Family Medicine

## 2015-12-09 ENCOUNTER — Encounter: Payer: Self-pay | Admitting: Family Medicine

## 2015-12-09 VITALS — BP 133/85 | HR 61 | Ht 64.0 in | Wt 257.0 lb

## 2015-12-09 DIAGNOSIS — G894 Chronic pain syndrome: Secondary | ICD-10-CM

## 2015-12-09 DIAGNOSIS — I10 Essential (primary) hypertension: Secondary | ICD-10-CM

## 2015-12-09 DIAGNOSIS — K30 Functional dyspepsia: Secondary | ICD-10-CM | POA: Diagnosis not present

## 2015-12-09 DIAGNOSIS — K929 Disease of digestive system, unspecified: Secondary | ICD-10-CM

## 2015-12-09 MED ORDER — HYDROCODONE-ACETAMINOPHEN 10-325 MG PO TABS: 1.0000 | ORAL_TABLET | Freq: Three times a day (TID) | ORAL | Status: DC | PRN

## 2015-12-09 MED ORDER — HYDROCODONE-ACETAMINOPHEN 5-325 MG PO TABS: 1.0000 | ORAL_TABLET | Freq: Three times a day (TID) | ORAL | Status: DC

## 2015-12-09 NOTE — Progress Notes (Signed)
Patient ID: ALNISA BRABENDER, female   DOB: 1951-05-20, 65 y.o.   MRN: [id number]   Subjective: CC:  Follow-up for med refill HPI: This history was provided by the patient.  Patient's PMH includes hypertension, fibromyalgia, obesity, depression, IBS, chronic pain, allergic rhinitis, COPD, GERD, hypothyroidism,   Patient is a 65 y.o. female presenting to clinic today to get a refill on her Vicodin which she takes for chronic pain and restless leg syndrome at night.  Patient complains of chronic insomnia, back pain, bilateral leg pain, nausea and intermittent diarrhea/constipation related to IBS, and chronic depression.  Patient complains of burping excessively today and states she is using Bentyl for abdominal pain and gas with some relief.  She sees a therapist and a psychiatrist for management of depression.  Patient has no new complaints today and presents to have a refill on her Vicodin.  She states she takes 5 mg Vicodin four times a day and 10 mg Vicodin once daily for restless leg syndrome at night.  Patient denies new N/V/D, fever, abdominal pain, chest pain, dizziness, shortness of breath, wheezing and cough.  Concerns today include:  1. Medication refill  Social History Reviewed: Former smoker. FamHx and MedHx updated.  Please see EMR.  ROS: Per HPI  Objective: Office vital signs reviewed. BP 133/85 mmHg  Pulse 61  Ht 5\' 4"  (1.626 m)  Wt 257 lb (116.574 kg)  BMI 44.09 kg/m2  Physical Examination:  General: Pleasant, moderately obese, awake, alert, well-nourished, NAD Cardio: RRR, S1S2 heard, no murmurs appreciated, no LE edema, cyanosis or clubbing; +2 radial and dorsalis pedis pulses bilaterally Pulm: CTAB, no wheezes, rhonchi or rales GI: soft, NT/ND,+BS x4 GU: deffered Extremities: MAE MSK: Normal gait and station, no arthralgias Skin: dry, intact, no rashes or lesions Neuro: Strength and sensation grossly intact  Assessment/ Plan: 65 y.o. female presents for  medication refill and no new medical complaints.  Patient will continue to follow-up with psychiatrist and therapist for management of depression.  Encouraged patient to try to increase physical activity and social engagement.  Follow-up: Patient will follow-up in one month for medication refill.    Sherron Ales, NP Student [facility]

## 2015-12-10 NOTE — Assessment & Plan Note (Signed)
Established problem. Need to check A1c next ov as weight increased.

## 2015-12-10 NOTE — Progress Notes (Signed)
I saw the patient with Sherron Ales, NP student. I have reviewed her assessment and repeated the significant findings with the patient We have discussed her assessment and plans.  I agree with them.

## 2015-12-10 NOTE — Assessment & Plan Note (Signed)
Established problem Controlled Continue current antihypertensive regiment

## 2015-12-10 NOTE — Assessment & Plan Note (Signed)
Established problem. Stable. No evidence of significant adverse effects Patient given medical leave from [employer] de[employer] store until May 2017.   Titrate down Hydrocodone/APAP 5/325 mg from QID to TID. Rx Hydrocodone/APAP 5/325 Disp# 90 fill 12/09/15 Continue Hydrocodone 10/325 qhs prn RLS exacerbation. Rx Hydrocodone/APAP 10/325 # 40 Ill 12/09/15. RTC one month

## 2015-12-10 NOTE — Assessment & Plan Note (Signed)
Established problem Uncontrollled Recommend increase in Bentyl to 20 mg QID for duration of exacerbation Consider recommending FDGard (Caraway and peppermint plant oils) capsule for functional dyspepsia- discuss next ov.

## 2015-12-20 ENCOUNTER — Telehealth: Payer: Self-pay | Admitting: Family Medicine

## 2015-12-20 MED ORDER — BACLOFEN 10 MG PO TABS: 5.0000 mg | ORAL_TABLET | Freq: Three times a day (TID) | ORAL | Status: DC

## 2015-12-20 NOTE — Telephone Encounter (Signed)
Prescription for [facility]aclofen [facility] in [place].  Please let patient know.  A/ Muscle spasms P/ Rx Baclofen 5 to 10 mg oral TID prn muscle spasm.  Disp #30, RF zero

## 2015-12-20 NOTE — Telephone Encounter (Signed)
Patient is aware of this and was headed that way. [name],CMA

## 2015-12-20 NOTE — Telephone Encounter (Signed)
Will forward to PCP.  Ivry Pigue L, RN  

## 2015-12-20 NOTE — Telephone Encounter (Signed)
Pt is here and would like to request a muscle relaxer for her back. Pt states she is recently in the process of weaning off of opioids.  [place] [facility] [place]   Thank you, Fonda Kinder, ASA

## 2016-01-17 ENCOUNTER — Other Ambulatory Visit: Payer: Self-pay | Admitting: Family Medicine

## 2016-01-20 ENCOUNTER — Encounter: Payer: Self-pay | Admitting: Family Medicine

## 2016-01-20 ENCOUNTER — Ambulatory Visit (INDEPENDENT_AMBULATORY_CARE_PROVIDER_SITE_OTHER): Payer: Federal, State, Local not specified - PPO | Admitting: Family Medicine

## 2016-01-20 ENCOUNTER — Ambulatory Visit
Admission: RE | Admit: 2016-01-20 | Discharge: 2016-01-20 | Disposition: A | Discharge status: Home / Self Care | Payer: Federal, State, Local not specified - PPO | Source: Ambulatory Visit | Attending: Family Medicine | Admitting: Family Medicine

## 2016-01-20 VITALS — BP 140/78 | HR 61 | Temp 97.8°F | Ht 64.0 in | Wt 259.0 lb

## 2016-01-20 DIAGNOSIS — M545 Low back pain, unspecified: Secondary | ICD-10-CM

## 2016-01-20 DIAGNOSIS — F331 Major depressive disorder, recurrent, moderate: Secondary | ICD-10-CM

## 2016-01-20 DIAGNOSIS — M47896 Other spondylosis, lumbar region: Secondary | ICD-10-CM | POA: Diagnosis not present

## 2016-01-20 DIAGNOSIS — R635 Abnormal weight gain: Secondary | ICD-10-CM

## 2016-01-20 DIAGNOSIS — R7303 Prediabetes: Secondary | ICD-10-CM | POA: Diagnosis not present

## 2016-01-20 DIAGNOSIS — M160 Bilateral primary osteoarthritis of hip: Secondary | ICD-10-CM

## 2016-01-20 LAB — POCT GLYCOSYLATED HEMOGLOBIN (HGB A1C): Hemoglobin A1C: 6.1

## 2016-01-20 MED ORDER — HYDROCODONE-ACETAMINOPHEN 10-325 MG PO TABS: 1.0000 | ORAL_TABLET | Freq: Two times a day (BID) | ORAL | Status: DC

## 2016-01-20 MED ORDER — HYDROCODONE-ACETAMINOPHEN 10-325 MG PO TABS: 1.0000 | ORAL_TABLET | Freq: Three times a day (TID) | ORAL | Status: DC | PRN

## 2016-01-20 NOTE — Patient Instructions (Signed)
Go get the xrays of your back

## 2016-01-21 ENCOUNTER — Encounter: Payer: Self-pay | Admitting: Family Medicine

## 2016-01-21 NOTE — Progress Notes (Addendum)
Patient ID: [name], female   DOB: 1950/12/23, 65 y.o.   MRN: [id number]   Subjective:    Patient ID: [name], female    DOB: 1951/08/07, 65 y.o.   MRN: [id number] [name] is alone Sources of clinical information for visit is/are patient and past medical records. Nursing assessment for this office visit was reviewed with the patient for accuracy and revision.   HPI  Problem List Items Addressed This Visit      Chronic pain syndrome - Last office visit for pain: 12/09/15 - Longstanding issue: low back, bilateral hips pain - She remains off MS Contin. She is taking schedule Vicodan 5/325 tab, one tab TID. She takes a [place] 10/325 when her restless leg is bothering her which is the last week ha been almost nightly. She has good symptom control with this. - - She did not have enough vicodon 5 mg tablets to last until this scheduled office visit.  She has been having to cut down to two tablets a day with a concurrent increase in her bilateral low back and buttock pain and left hip pain the last couple of weeks.  She has spent most of her time in bed the last couple weeks.   - the low back pain is a buring over sacrum and into both buttocks.  It improves if she bends at the waist onto her bed or curls into ball in bed.  Pain occasionally goes below knees when standing or walking.      Depression - Longstanding - Taking celexa 20 mg daily, - Worsend mood and ahedonia with worsening pain. - no thoughts of self harm.  Next visit with Dr Reece Levy in June. - PHQ9=17, exteremely difficult to function - Denies thought or plans of self harm.   SH: Smokes Marijuana right before bed daily.  She reports improves that this improves her functional dyspepsia symptoms and anxiety.    Review of Systems See HPI No fever No vomiting.    Objective:   Physical Exam VS reviewed. Gen: Uncomfortable, bending over exam table,  Gait: Able to rise from chair without using her arms, Gait is  slowl pace without deviation aobut axis of line of travel. Stance is slightly wide. Neuro: Alert, active, cooperative, distracted SLR bilaterally without pain.  Motor: 5/5 knees and ankles bilaterally, 4/5 bilaterally with hip flexion. DTR (+)1 and knees, 0 at ankles bilat.  Psych: Normal affect/thought/speech and language.    Assessment & Plan:

## 2016-01-21 NOTE — Assessment & Plan Note (Signed)
Established problem worsened.  Rx vicodan 10/325 bid Ibuprofen prn  Degenertive changes on both hips with sclerosis evient on lumbar xrays on 01/20/16

## 2016-01-21 NOTE — Assessment & Plan Note (Signed)
Established problem worsened.  PHQ9=17 Mahalia relates worsening mood to back and hip pain. No thoughts of self harm. Cotninue Celexa 20 mg daily  Treating back and hip pain RTC 3 weeks.

## 2016-01-21 NOTE — Assessment & Plan Note (Addendum)
Established problem worsened.  Further workup planned. Given history of lumbar spondylolysis and  lumbar disc L2-3 disease, patient may have advanced spinal stenosis. Lumbar Xrays to rule out acute process shows L4-5, L5-S1 DJD, loss disc height L2-3, L4-5 DJD in both hips.   Rx Vicodan 10/325 bid Disp#60 to fill now and to fill in 30 days.  Ibufrofen prn RTC in 3 weeks FU symptoms May need lmbar spine MRI to look for severe lumbar spinal stenosis.

## 2016-01-26 ENCOUNTER — Encounter: Payer: Self-pay | Admitting: Family Medicine

## 2016-01-26 ENCOUNTER — Ambulatory Visit (INDEPENDENT_AMBULATORY_CARE_PROVIDER_SITE_OTHER): Payer: Federal, State, Local not specified - PPO | Admitting: Family Medicine

## 2016-01-26 VITALS — BP 122/66 | HR 73 | Temp 98.4°F | Wt 255.0 lb

## 2016-01-26 DIAGNOSIS — R05 Cough: Secondary | ICD-10-CM | POA: Diagnosis not present

## 2016-01-26 DIAGNOSIS — R059 Cough, unspecified: Secondary | ICD-10-CM

## 2016-01-26 MED ORDER — BENZONATATE 200 MG PO CAPS: 200.0000 mg | ORAL_CAPSULE | Freq: Three times a day (TID) | ORAL | Status: DC | PRN

## 2016-01-26 NOTE — Patient Instructions (Signed)
I am giving you a Rx for tessalon for your cough. Definitely take your nausea medicine. Increase your fluids and take it easy for the next couple of days. I thik this is a viral illness and you should get better in a few days.

## 2016-01-26 NOTE — Progress Notes (Signed)
   Subjective:    Patient ID: [name], female    DOB: 1951/10/24, 65 y.o.   MRN: [id number]  HPI SDA CC: 4 days ago started with cough that is nonproductive, low-grade him to elevation to 100.5, some sweating and chills. Headache. Throat pain. 2 days ago started with some nausea but no emesis. Yesterday and today she is also having muscular pain in her chest from coughing she took care of her grandchildren over the weekend and they had very similar symptoms.   Review of Systems See history of present illness    Objective:   Physical Exam Vital signs reviewed. GENERAL: Well-developed, well-nourished, no acute distress. CARDIOVASCULAR: Regular rate and rhythm no murmur gallop or rub LUNGS: Clear to auscultation bilaterally, no rales or wheeze. ABDOMEN: Soft positive bowel sounds NEURO: No gross focal neurological deficits. MSK: Movement of extremity x 4. HEENT: Oropharynx reveals no exudate, no erythema, no lesions. Neck is without any lymphadenopathy.        Assessment & Plan:  Viral illness with myalgias, cough, nausea. She has nausea medicine at home which she's not been taking because she said she "felt too bad". I recommend she take that, I'll give her some Tessalon Perles for the cough

## 2016-02-17 ENCOUNTER — Encounter: Payer: Self-pay | Admitting: Family Medicine

## 2016-02-17 ENCOUNTER — Ambulatory Visit (INDEPENDENT_AMBULATORY_CARE_PROVIDER_SITE_OTHER): Payer: Federal, State, Local not specified - PPO | Admitting: Family Medicine

## 2016-02-17 VITALS — BP 126/60 | HR 66 | Ht 64.0 in | Wt 262.0 lb

## 2016-02-17 DIAGNOSIS — M5442 Lumbago with sciatica, left side: Secondary | ICD-10-CM | POA: Diagnosis not present

## 2016-02-17 DIAGNOSIS — M7138 Other bursal cyst, other site: Secondary | ICD-10-CM | POA: Insufficient documentation

## 2016-02-17 DIAGNOSIS — M4806 Spinal stenosis, lumbar region: Secondary | ICD-10-CM | POA: Diagnosis not present

## 2016-02-17 DIAGNOSIS — M5441 Lumbago with sciatica, right side: Secondary | ICD-10-CM | POA: Diagnosis not present

## 2016-02-17 DIAGNOSIS — M47896 Other spondylosis, lumbar region: Secondary | ICD-10-CM

## 2016-02-17 DIAGNOSIS — M48061 Spinal stenosis, lumbar region without neurogenic claudication: Secondary | ICD-10-CM

## 2016-02-17 HISTORY — DX: Other bursal cyst, other site: M71.38

## 2016-02-17 MED ORDER — HYDROCODONE-ACETAMINOPHEN 10-325 MG PO TABS: 1.0000 | ORAL_TABLET | Freq: Three times a day (TID) | ORAL | Status: DC | PRN

## 2016-02-17 NOTE — Progress Notes (Signed)
   Subjective:    Patient ID: [name], female    DOB: 02/11/1951, 65 y.o.   MRN: [id number]  HPI  Chronic Back Pain - Acute woresening in last month - location in low lumbar/sacral midline - Worsened by prolonged standing.  - Improved with leaning forward or lying down or sitting - Raditation into bilateral buttocks and bilateral posterior thighs, occasional pain can go down below knee to calves on either side.  - No numbness, no paresthesias, no weakness in legs - No bladder incontinence beyond occasional stress incontinence events with cough/sneeze.  - No inner thihg numbness  - Increased amount of hydrocodone/APAP 10/325 daily from twice a day to three times a day.  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  On indefinite leave.    SH; no smoking.  No alcohol   Review of Systems     Objective:   Physical Exam  Gen: leaning over exam room table when I entered, mild distress, non-toxic  Alert, coooperative, groomed.  Tender over lower back  Neuro: negative seated SLR bilaterally. Normal strength in bil hips with flex/ext, normal strength in bil knee flex/ext, normal strength in bil ankle DF/PF. DTR: Zero both ankles, 2(+) knees bil.  Rhomberg: negative.  Tone: normal both legs.   DG Xrays lumbar spine (01/20/16):  Degenerative arthropathy of both hips.  Degenerative facet arthropathy bilaterally at L4-5 and L5-S 1. Stable slight anterior wedging at L1, possibly physiologic, no change from 2013. Loss of disc height at L2-3 and L3-4.  IMPRESSION: 1. The lumbar spondylosis and secondary evidence of degenerative disc disease. No subluxation or acute bony findings. 2. Chronic mild anterior wedging at L1, possibly physiologic. 3. Degenerative arthropathy of both hips.     Assessment & Plan:

## 2016-02-17 NOTE — Patient Instructions (Signed)
Your back pain with leg pain may be due to spinal nerve involvement.  Plan is to get an MRI of your low back (Lumbar spine) to see if there is anything pressing on your nerves.   When standing, keep one leg up on a stool to open up your spinal canal giving more room to your spinal nerves. Flexing forward at the hips will help decrease the pain.

## 2016-02-17 NOTE — Assessment & Plan Note (Signed)
Established problem worsened.  Increasing pain and impairment of function (ability to walk and prolonged standing) due to back and bilateral buttock/thigh pain. Lumbar Xray with DJD and DDD changes of lumbosacral spine. Hx of L2-3 large herniated disc in 2003 on MRI treated without success by Epidural injections.  Concern for Lumbar spinal stenosis that is impairing function.  Plan:  Lumbar MRI to assess presence and degree of lumbar spinal stenosis. Refilled Vicodan 10/325 with increase temporarily to 75 tablets for month from 60 tablets per month.

## 2016-02-23 ENCOUNTER — Other Ambulatory Visit: Payer: Federal, State, Local not specified - PPO

## 2016-02-26 ENCOUNTER — Ambulatory Visit
Admission: RE | Admit: 2016-02-26 | Discharge: 2016-02-26 | Disposition: A | Discharge status: Home / Self Care | Payer: Federal, State, Local not specified - PPO | Source: Ambulatory Visit | Attending: Family Medicine | Admitting: Family Medicine

## 2016-02-26 DIAGNOSIS — M5442 Lumbago with sciatica, left side: Secondary | ICD-10-CM

## 2016-02-26 DIAGNOSIS — M5441 Lumbago with sciatica, right side: Secondary | ICD-10-CM

## 2016-02-26 DIAGNOSIS — M48061 Spinal stenosis, lumbar region without neurogenic claudication: Secondary | ICD-10-CM

## 2016-02-28 ENCOUNTER — Telehealth: Payer: Self-pay | Admitting: *Deleted

## 2016-02-28 NOTE — Telephone Encounter (Signed)
Patient called to get her May appt with pcp but there are no openings for may.  Patient states that she has already had her MRI and has also lost her son in law since the last time she was here.  Please advise on when patient can be seen. Artina Minella,CMA

## 2016-03-01 ENCOUNTER — Telehealth: Payer: Self-pay | Admitting: Family Medicine

## 2016-03-01 ENCOUNTER — Encounter: Payer: Self-pay | Admitting: Family Medicine

## 2016-03-01 ENCOUNTER — Ambulatory Visit (HOSPITAL_COMMUNITY): Payer: Federal, State, Local not specified - PPO

## 2016-03-01 NOTE — Telephone Encounter (Signed)
Please schedule patient with a [facility] team physician in next 1 to 2 weeks to discuss MRI and whether cysts impinging on L4 root has corresponding focal neuro findings  Focal neuro findings include:   pain or numbness in L4 dermatome,   decreased left knee reflex from L4 impingement compared to right knee reflex   unilateral left leg quadraceps' weakness when squatting (holding onto exam table) standing only on left leg compared to right leg. L4 root dominant innervating nerve root to quad muscles.

## 2016-03-01 NOTE — Telephone Encounter (Signed)
I discussed lumbar MRI finding of l5-5 facet joint synovial cyst with impingement on l4 root at l4-5 foramen.  I recommended she be seen at [facility] in next 1-2 weeks to see if the MRI finding has corresponding symptoms and signs warranting interventions.  If interested,  please see my previous documentation note for this patient with recommendations.[name]

## 2016-03-01 NOTE — Telephone Encounter (Signed)
To Dr. Lamar Benes for 4:15 appointment on 03/01/2016. Zylie Mumaw, Salome Spotted, CMA

## 2016-03-01 NOTE — Progress Notes (Signed)
Patient ID: [name], female   DOB: 10/03/1951, 65 y.o.   MRN: [id number] I reviewed  Lumbar spine MRI results with dominant finding of Synovial cyst of left L4-L5 facet joint extending into the left L4-L5 foramen that is compressing exiting L4 nerve root.   I had not noted left unilateral focality on my last neuro exam of patient.    I recommend that patient be seen for repeat examination looking particularly for physical manifestation of L4 radiculopathy, including:  * pain or numbness in L4 dermatome,  * decreased knee reflex from L4 impingement compared to right knee reflex  *unilateral left leg quadraceps' weakness when squatting (holding onto exam table) standing only on left leg compared to right leg. L4 root dominant innervating nerve root to quad muscles.  I have asked that a physician from [place] team see patient to reassess her chronic low back and leg pain in next 1 to 2 weeks.

## 2016-03-02 ENCOUNTER — Encounter: Payer: Self-pay | Admitting: Family Medicine

## 2016-03-02 ENCOUNTER — Ambulatory Visit (INDEPENDENT_AMBULATORY_CARE_PROVIDER_SITE_OTHER): Payer: Federal, State, Local not specified - PPO | Admitting: Family Medicine

## 2016-03-02 VITALS — BP 156/86 | HR 60 | Temp 98.1°F | Wt 262.0 lb

## 2016-03-02 DIAGNOSIS — M7138 Other bursal cyst, other site: Secondary | ICD-10-CM | POA: Diagnosis not present

## 2016-03-02 DIAGNOSIS — M47896 Other spondylosis, lumbar region: Secondary | ICD-10-CM

## 2016-03-02 NOTE — Progress Notes (Signed)
   Subjective:    Patient ID: [name], female    DOB: April 09, 1951, 65 y.o.   MRN: [id number]  HPI  CC: follow up MRI  # Low back pain:  MRI last week, discussed results with PCP and told to follow up here  Sister in law had surgery from Dr. Susa Day at [facility] -- she is interested in going and seeing him.  Symptoms remain persistent, notices pain on both sides but radiation is down left leg mostly ROS: no fevers or chills, no weakness  Social Hx: former smoker, quit 2009, 66 pack year history   Review of Systems   See HPI for ROS.   Past medical history, surgical, family, and social history reviewed and updated in the EMR as appropriate. Objective:  BP 156/86 mmHg  Pulse 60  Temp(Src) 98.1 F (36.7 C) (Oral)  Wt 262 lb (118.842 kg) Vitals and nursing note reviewed  General: no apparent distress  Back: she is rather diffusely tender but mostly lower lumbar spine, left into buttocks Neuro: slr testing positive on both sides. Has an antalgic gait.  Assessment & Plan:   1. Other osteoarthritis of spine, lumbar region MRI with cyst impinging on left L4 nerve root as well as facet degenerative disease L5-S1. Patient would like surgical referral to discuss any options that may be available to her. Pain has continued to progress despite medical therapy. Follow up as needed/after ortho appointment.  - Ambulatory referral to Orthopedic Surgery  2. Synovial cyst of lumbar facet joint As above - Ambulatory referral to Orthopedic Surgery

## 2016-03-05 ENCOUNTER — Other Ambulatory Visit: Payer: Self-pay | Admitting: Family Medicine

## 2016-03-15 ENCOUNTER — Other Ambulatory Visit: Payer: Self-pay | Admitting: Family Medicine

## 2016-03-15 DIAGNOSIS — M5441 Lumbago with sciatica, right side: Secondary | ICD-10-CM

## 2016-03-15 DIAGNOSIS — M48061 Spinal stenosis, lumbar region without neurogenic claudication: Secondary | ICD-10-CM

## 2016-03-15 DIAGNOSIS — M5442 Lumbago with sciatica, left side: Secondary | ICD-10-CM

## 2016-03-15 NOTE — Telephone Encounter (Signed)
Need rx for hydrocodone written for pickup Friday.  Can date for Saturday's date since medication will run out at that time.   Call Mrs. M[place]ane when ready.

## 2016-03-17 NOTE — Telephone Encounter (Signed)
Patient called back to check on the status of her refill.  Will forward to both covering provider and also the last provider patient saw.  She will run out tomorrow.  Please call her when this is ready for pick up. Parveen Freehling,CMA

## 2016-03-20 ENCOUNTER — Other Ambulatory Visit: Payer: Self-pay | Admitting: Specialist

## 2016-03-20 DIAGNOSIS — M5432 Sciatica, left side: Secondary | ICD-10-CM

## 2016-03-20 MED ORDER — HYDROCODONE-ACETAMINOPHEN 10-325 MG PO TABS: 1.0000 | ORAL_TABLET | Freq: Three times a day (TID) | ORAL | Status: DC | PRN

## 2016-03-20 NOTE — Telephone Encounter (Signed)
Patient seen on 4/13 by Dr Raymondo Band.  Plan was "Refilled Vicodan 10/325 with increase temporarily to 75 tablets for month from 60 tablets per month."  Will refill for 60 tabs since prior increase was temporary  Rx given to [facility]  [place]

## 2016-03-20 NOTE — Telephone Encounter (Signed)
Patient is aware that script is ready for pick up. Carmie Lanpher,CMA  

## 2016-03-28 ENCOUNTER — Ambulatory Visit
Admission: RE | Admit: 2016-03-28 | Discharge: 2016-03-28 | Disposition: A | Discharge status: Home / Self Care | Payer: Federal, State, Local not specified - PPO | Source: Ambulatory Visit | Attending: Specialist | Admitting: Specialist

## 2016-03-28 DIAGNOSIS — M5432 Sciatica, left side: Secondary | ICD-10-CM

## 2016-03-28 MED ORDER — IOPAMIDOL (ISOVUE-M 200) INJECTION 41%
1.0000 mL | Freq: Once | INTRAMUSCULAR | Status: AC
Start: 2016-03-28 — End: 2016-03-28
  Administered 2016-03-28: 1 mL via INTRA_ARTICULAR

## 2016-03-28 NOTE — Discharge Instructions (Signed)
Cyst Aspiration Post Procedure Discharge Instructions  1. May resume a regular diet and any medications that you routinely take (including pain medications). 2. No driving day of procedure. 3. Upon discharge go home and rest for at least 4 hours.  May use an ice pack as needed to injection sites on back. 4. Remove bandades later, today.    Please contact our office at [phone] for the following symptoms:   Fever greater than 100 degrees  Increased swelling, pain, or redness at injection site.   Thank you for visiting [facility].

## 2016-03-29 ENCOUNTER — Other Ambulatory Visit: Payer: Self-pay | Admitting: Family Medicine

## 2016-04-05 ENCOUNTER — Other Ambulatory Visit: Payer: Self-pay | Admitting: Family Medicine

## 2016-04-11 ENCOUNTER — Encounter: Payer: Self-pay | Admitting: Family Medicine

## 2016-04-11 DIAGNOSIS — M7138 Other bursal cyst, other site: Secondary | ICD-10-CM

## 2016-04-11 NOTE — Progress Notes (Signed)
Patient ID: [name], female   DOB: November 07, 1950, 65 y.o.   MRN: [id number] Summary of office visit note with Dr Dellis Filbert Beane (Ortho) CC: back pain with radicular pain into leg  Assessment 1. Acute on chronic mechanical back pain secondary to lumbar sponylosis 2. New onset of left lower extremity radicular pain at L4 nerve root distribution secondary to synovial cyst extending from a facet arthrosis on left at L4-5, extending into the L4-5 foramen  Plan:  CT-guided aspiration of cyst planned.  If fails, then surgical decompression and cyst remova surgically. Return to see Dr Tonita Cong after aspiration

## 2016-04-13 ENCOUNTER — Ambulatory Visit (INDEPENDENT_AMBULATORY_CARE_PROVIDER_SITE_OTHER): Payer: Federal, State, Local not specified - PPO | Admitting: Family Medicine

## 2016-04-13 ENCOUNTER — Encounter: Payer: Self-pay | Admitting: Family Medicine

## 2016-04-13 VITALS — BP 121/64 | HR 60 | Temp 98.1°F | Wt 264.0 lb

## 2016-04-13 DIAGNOSIS — M7138 Other bursal cyst, other site: Secondary | ICD-10-CM | POA: Diagnosis not present

## 2016-04-13 DIAGNOSIS — M5441 Lumbago with sciatica, right side: Secondary | ICD-10-CM | POA: Diagnosis not present

## 2016-04-13 DIAGNOSIS — M5442 Lumbago with sciatica, left side: Secondary | ICD-10-CM

## 2016-04-13 DIAGNOSIS — M48061 Spinal stenosis, lumbar region without neurogenic claudication: Secondary | ICD-10-CM

## 2016-04-13 DIAGNOSIS — M4806 Spinal stenosis, lumbar region: Secondary | ICD-10-CM

## 2016-04-13 DIAGNOSIS — M160 Bilateral primary osteoarthritis of hip: Secondary | ICD-10-CM

## 2016-04-13 DIAGNOSIS — G894 Chronic pain syndrome: Secondary | ICD-10-CM | POA: Diagnosis not present

## 2016-04-13 MED ORDER — HYDROCODONE-ACETAMINOPHEN 10-325 MG PO TABS: 1.0000 | ORAL_TABLET | Freq: Three times a day (TID) | ORAL | Status: DC | PRN

## 2016-04-13 NOTE — Progress Notes (Signed)
   Subjective:    Patient ID: [name], female    DOB: 1951-08-27, 65 y.o.   MRN: [id number]  HPI  Back pain with left leg radiculopathy - Onset in last several weeks - Taking Vicodan with - Aspiration of left L4-5 foraminal cyst by IR with reduction in radicular pain - Resigned from [employer] job  Bilateral hip pain - longstanding problem - Known Hip osteoarthritis bilaterally on MRI pelvis in 2008 - taking vicodan for pain with poor control - Pt did not discuss with Dr Tonita Cong (ortho)  Chronic Pain syndrom - HPI Uses vicodan 10/325 two to three tablets a daywhich does help  ADE with analgesic therapies: baclofen Non-allopathic therapites: none currently.  Pt interested in pain Clinic   Pain Inventory Average Pain: 7 out of 10 Pain Right Now: 7 My pain is constant, aching, burning  In the last 24 hours, has pain interfered with the following? (0 to 10 scale, 10 worse) General activity: able to start moving in with dgt Relation with others: no problem Enjoyment of life: pain interferes  What TIME of day is your pain at its worst? All day Sleep (in general): no  Pain is worse with: standing and walking Pain improves with: Vicodan  Mobility walk without assistance: no use a cane/walker: cane ability to climb steps?: yes, with difficulty do you drive?: yes Difficulty driving with use of pain medications?: no  Function Employed (Y/N): no, resigned from [employer]  date last employed: last year   Neuro/Psych Prolonged sadness / depressed mood: not currently Anxiety: mild to moderate   Other healthcare providers involved in your care: Dr Tonita Cong (ortho) Any diagnostic or therapeutic changes since last visit?: Aspiration of foraminal cyst by IR  No smoking  Review of Systems See HPI    Objective:   Physical Exam VS reviewed GEN: Alert, Cooperative, Groomed, NAD COR: RRR, No M/G/R, No JVD, Normal PMI size and location LUNGS: BCTA, No Acc mm use, speaking in  full sentences Gait: Slow speed, No significant path deviation, Step through +,  Psych: Normal affect/thought/speech/language        Assessment & Plan:

## 2016-04-13 NOTE — Patient Instructions (Signed)
Follow up with Dr Tonita Cong about four weeks after the cyst drainage.  Your Blood Pressure is in a good range.   Once Dr Tonita Cong releases you, we can discuss referral to Pain Clinic.

## 2016-04-13 NOTE — Assessment & Plan Note (Signed)
Established problem that has improved with IR aspiration of cyst. Decreased radicular pain left leg. Persistent pain in low back Follow up with Dr Tonita Cong

## 2016-04-13 NOTE — Assessment & Plan Note (Signed)
Established problem worsened.  Patient interested in referral to a Pain Clinic once Dr Tonita Cong releases her.  Weight loss has been a great difficulty for patient secondary to pain with activity

## 2016-04-13 NOTE — Assessment & Plan Note (Signed)
Established problem Stable Tolerating Vicodan 10/325 No aberrant behaviors. Refill of Vicodan one month supply today.

## 2016-05-07 ENCOUNTER — Other Ambulatory Visit: Payer: Self-pay | Admitting: Family Medicine

## 2016-05-10 ENCOUNTER — Other Ambulatory Visit: Payer: Self-pay | Admitting: *Deleted

## 2016-05-10 MED ORDER — DICYCLOMINE HCL 10 MG PO CAPS: ORAL_CAPSULE | ORAL | Status: DC

## 2016-05-11 ENCOUNTER — Ambulatory Visit (INDEPENDENT_AMBULATORY_CARE_PROVIDER_SITE_OTHER): Payer: Federal, State, Local not specified - PPO | Admitting: Family Medicine

## 2016-05-11 ENCOUNTER — Encounter: Payer: Self-pay | Admitting: Family Medicine

## 2016-05-11 VITALS — BP 128/70 | HR 63 | Ht 64.0 in | Wt 263.0 lb

## 2016-05-11 DIAGNOSIS — G2581 Restless legs syndrome: Secondary | ICD-10-CM

## 2016-05-11 DIAGNOSIS — M7138 Other bursal cyst, other site: Secondary | ICD-10-CM

## 2016-05-11 DIAGNOSIS — F331 Major depressive disorder, recurrent, moderate: Secondary | ICD-10-CM

## 2016-05-11 DIAGNOSIS — M5441 Lumbago with sciatica, right side: Secondary | ICD-10-CM | POA: Diagnosis not present

## 2016-05-11 DIAGNOSIS — M4806 Spinal stenosis, lumbar region: Secondary | ICD-10-CM

## 2016-05-11 DIAGNOSIS — I700 Atherosclerosis of aorta: Secondary | ICD-10-CM

## 2016-05-11 DIAGNOSIS — M160 Bilateral primary osteoarthritis of hip: Secondary | ICD-10-CM

## 2016-05-11 DIAGNOSIS — E2839 Other primary ovarian failure: Secondary | ICD-10-CM | POA: Diagnosis not present

## 2016-05-11 DIAGNOSIS — M48061 Spinal stenosis, lumbar region without neurogenic claudication: Secondary | ICD-10-CM

## 2016-05-11 DIAGNOSIS — G894 Chronic pain syndrome: Secondary | ICD-10-CM

## 2016-05-11 DIAGNOSIS — M797 Fibromyalgia: Secondary | ICD-10-CM

## 2016-05-11 DIAGNOSIS — M5442 Lumbago with sciatica, left side: Secondary | ICD-10-CM

## 2016-05-11 DIAGNOSIS — I10 Essential (primary) hypertension: Secondary | ICD-10-CM

## 2016-05-11 MED ORDER — HYDROCODONE-ACETAMINOPHEN 10-325 MG PO TABS: 1.0000 | ORAL_TABLET | Freq: Three times a day (TID) | ORAL | Status: DC | PRN

## 2016-05-11 NOTE — Patient Instructions (Signed)
Remember to mention the hip pain to Dr Tonita Cong, as well as the return of the left leg pain.   Dr Javad Salva will begin the process of referring you to a Pain Clinic.  Usually the Pain Clinic will call you to set up the appointment once the consultation is approved.

## 2016-05-12 ENCOUNTER — Encounter: Payer: Self-pay | Admitting: Family Medicine

## 2016-05-12 DIAGNOSIS — I700 Atherosclerosis of aorta: Secondary | ICD-10-CM | POA: Insufficient documentation

## 2016-05-12 NOTE — Assessment & Plan Note (Signed)
Adequate blood pressure control.  No evidence of new end organ damage.  Tolerating medication without significant adverse effects.  Plan to continue current blood pressure regiment.   

## 2016-05-12 NOTE — Assessment & Plan Note (Signed)
Established problem worsened.  MRI Pelvis (03/13/07) Bilateral osteoarthritis of the hips, left greater than right.  The  patient has a slightly more prominent hip effusion on the left than  the right. Encouraged Ms [name] to discuss bilateral hip pain with Dr Tonita Cong when she sees him for Follow up appointment

## 2016-05-12 NOTE — Assessment & Plan Note (Signed)
Established problem worsened.  Increased patient's Vicodan from 60 per month to 75 tab per month which patient can use every other night if needed for RLS exacerbation

## 2016-05-12 NOTE — Assessment & Plan Note (Addendum)
Established problem Worsened pain in low back, hips and left leg Tolerating Vicodan 10/325 No aberrant behaviors. Refill of Vicodan one month supply today, fill in 30 days and fill in 60 days.   [name] requests a referall to a Pain Management clinic to see if there are other treatment options, including nonpharmacologic ones for pher chronic musculoskeletal pains that are limiting her activities.

## 2016-05-12 NOTE — Assessment & Plan Note (Signed)
Secondary CV risk interventions

## 2016-05-12 NOTE — Assessment & Plan Note (Addendum)
Established problem worsened.  Multiple tender points in upper thoracic back muscles Recommendation of seeking professional massge interventions, gentle stretching.  Pt developed hives on Gabapentin in past, so will not try Pregabalin.  Pt has tolerated Mobic in past.  She has not responded to Duloxetine in past

## 2016-05-12 NOTE — Assessment & Plan Note (Signed)
Established problem Relapse of left leg radicular pain Pt to see Dr Tonita Cong to see other options for tx

## 2016-05-12 NOTE — Assessment & Plan Note (Addendum)
Established problem worsened.  Moderate stage No thoughts of self harm Pain and recent death of son-in-law contributing to depressed mood and decreased pleasure Continue current Citalopram, trazodone, Atarax, Ativan

## 2016-05-12 NOTE — Assessment & Plan Note (Signed)
Assessment:  Obesity with BMI and comorbidities as noted above. Signs of hypothyroidism: aadeqautely replaced with LT4 Signs of hypercortisolism: none Contraindications to weight loss: none Patient readiness to commit to diet and activity changes: fair Barriers to weight loss: stress (family, mental illness), limited income, social factors (possible supports also with chronic pain), Chronic Pain from FM and osteoarthrtis  Plan: Will discuss referral to nutrition management if interested.
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
## 2016-05-17 ENCOUNTER — Telehealth: Payer: Self-pay | Admitting: Family Medicine

## 2016-05-17 NOTE — Telephone Encounter (Signed)
Patient asks PCP to complete form for her surgery. Please, follow up.

## 2016-05-17 NOTE — Telephone Encounter (Signed)
Clinic portion of form filled out, in providers box for his completion. Page, cma.

## 2016-05-22 NOTE — Telephone Encounter (Signed)
Patient informed that surgical clearance form faxed to [facility] and original form left up front for patient pickup.  Derl Barrow, RN

## 2016-05-23 ENCOUNTER — Ambulatory Visit: Payer: Self-pay | Admitting: Orthopedic Surgery

## 2016-05-24 ENCOUNTER — Ambulatory Visit
Admission: RE | Admit: 2016-05-24 | Discharge: 2016-05-24 | Disposition: A | Discharge status: Home / Self Care | Payer: Federal, State, Local not specified - PPO | Source: Ambulatory Visit | Attending: Family Medicine | Admitting: Family Medicine

## 2016-05-24 DIAGNOSIS — E2839 Other primary ovarian failure: Secondary | ICD-10-CM

## 2016-05-25 ENCOUNTER — Encounter: Payer: Self-pay | Admitting: Family Medicine

## 2016-05-29 ENCOUNTER — Ambulatory Visit: Payer: Self-pay | Admitting: Orthopedic Surgery

## 2016-05-29 NOTE — H&P (Signed)
[name] is an 65 y.o. female.   Chief Complaint: back and B/L leg pain HPI: The patient is a 65 year old female who presents for a Follow-up for Follow-up back. The patient is being followed for their back pain. They are now 7 week(s) out from aspiration and injection of synovial cyst at L4-5. Symptoms reported today include: pain, pain at night, aching, stiffness, pain with weightbearing, leg pain and pain with standing. The patient states that they are doing poorly. Current treatment includes: pain medications and heating pad. The following medication has been used for pain control: Hydrocodone. The patient reports their current pain level to be moderate. The patient has reported improvement of their symptoms with: heat. The patient indicates that they have questions or concerns today regarding pain and their progress at this point. Note for "Follow-up back": She starts pain managment on 05/31/16.  She reports seven weeks status post. She had good relief initially, now it has come back. This is occurrence. She has fibromyalgia. Family medicine doctor is referring her to pain management. She has chronic back pain, but this is mainly leg pain, worse with activity, better with rest. She sees Dr. Sherren Mocha McDiardmid  Past Medical History:  Diagnosis Date  . Adrenal nodule ([place]) 04/08/2011   04/08/11 Abdominal CT showed Left adrenal nodule which is tachnically indeterminate but most likely an adenoma.  It is 1.7 cm and 42 HU on portal venous phase image. 31 HU on delayed images.   01/09/2012 Abdominal CT w/ & w/o CM: Stable benign left adrenal adenoma. No further specific follow-up is needed for this finding.    . Allergic rhinitis 01/03/2007   Qualifier: Diagnosis of  By: Drucie Ip    . Carpal tunnel syndrome 02/14/2007   S/P carpel tunnel release bilaterally.    . Chronic pain syndrome 12/24/2008  . COPD WITHOUT EXACERBATION 01/03/2007   Qualifier: Diagnosis of  By: Drucie Ip    . DISC WITH  RADICULOPATHY 01/03/2007   Qualifier: Diagnosis of  By: Drucie Ip    . Esophageal reflux 01/03/2007   Centricity Description: GASTROESOPHAGEAL REFLUX, NO ESOPHAGITIS Qualifier: Diagnosis of  By: Drucie Ip   Centricity Description: GERD Qualifier: Diagnosis of  By: Tye Savoy MD, Tommi Rumps    . Functional gastrointestinal disorder 06/18/2008   Functional Gastrointestinal Disorder with frequent eructation and bloating sensation with acute flares.  Responsive to Compazine and Bentyl.  Takes a few days to calm down.    Marland Kitchen HEMORRHOIDS, NOS 01/03/2007   Qualifier: Diagnosis of  By: Drucie Ip    . Hepatic steatosis 04/12/2011  . Herpes simplex labialis 05/11/2011  . Hip osteoarthritis 05/23/2007   MRI Pelvis (03/13/07) Bilateral osteoarthritis of the hips, left greater than right.  The  patient has a slightly more prominent hip effusion on the left than  the right.  No other significant abnormality.    . History of MRSA infection 04/28/2011  . HYPERCHOLESTEROLEMIA 12/24/2008  . Hypertension   . HYPERTENSION, BENIGN SYSTEMIC 01/03/2007   Qualifier: Diagnosis of  By: Drucie Ip    . HYPERTRIGLYCERIDEMIA 11/11/2007   Qualifier: Diagnosis of  By: McDiarmid MD, Sherren Mocha    . Hypothyroidism 02/15/2007   Qualifier: Diagnosis of  By: McDiarmid MD, Sherren Mocha    . HYPOTHYROIDISM, BORDERLINE 02/15/2007  . Irritable bowel syndrome 01/03/2007   Qualifier: Diagnosis of  By: Drucie Ip    . LACTOSE INTOLERANCE 03/10/2008  . Major depressive disorder, recurrent episode ([place]) 01/03/2007   Qualifier: Diagnosis of  By:  Kivett, Whitney    . Obesity (BMI 30.0-34.9) 09/30/2007   Has lost 55 lbs since easter. Goal of <190 by 08/05/12.     . Obesity, Class III, BMI 40-49.9 (morbid obesity) ([place]) 09/30/2007       . Osteoarthritis of spine 01/03/2007   MR I Lumbar (08/03): GeneralDDD; L2-3 Large  HNP.  Mod pos  hnp - 12/14/2005, smhnpw/irritL4root - 12/14/2005    . OSTEOARTHRITIS, HANDS, BILATERAL 06/18/2008   Qualifier: Diagnosis  of  By: McDiarmid MD, Sherren Mocha    . PATELLO FEMORAL STRESS SYNDROME 01/03/2007   Qualifier: Diagnosis of  By: Drucie Ip    . Periodic limb movements of sleep 12/05/2010   Diagnosed on Polysomnography testing Fall 2011.     . Pre-diabetes 12/25/2008   Qualifier: Diagnosis of  By: McDiarmid MD, Sherren Mocha    . RESTLESS LEGS SYNDROME 09/11/2007   Polysomnography (10/2010, Dr Keturah Barre, interpreter. ) showed periodic limb movements that frequently awoke patient from sleep with arousal index of 4 per hour. Dr Danton Sewer (Sleep Specialist) thought her RLS was the major contributor to patient poor sleep condition, more than any sleep apnea.  He recommended considering opamine agonist (either requip or mirapex) to see if this will help.     . Rosacea, acne 10/24/2011  . SIGMOID POLYP 01/23/2008  . Solitary lung nodule 04/08/2011  . Urticaria, chronic 05/22/2011  . VITAMIN D DEFICIENCY 03/03/2010    Past Surgical History:  Procedure Laterality Date  . bladder tack  1998   Dr Jeffie Pollock (urology)  . BUNIONECTOMY  1992   bilateral feet with pins in great toes  . CARPAL TUNNEL RELEASE  2010   Bilateral, Dr Theodis Sato  . COLONOSCOPY  2004   Dr Verdia Kuba (GI)  . CYSTOCELE REPAIR  1998    Dr Gertie Fey (GYN)-  . ESOPHAGOGASTRODUODENOSCOPY    . EXPLORATORY LAPAROTOMY    . FACIAL LACERATIONS REPAIR     Facial Laceration repair as adolescent   . INJECTION HIP INTRA ARTICULAR  2008   Left Hip fluoroscopically-guided corticosteorid injection for painful osteoarthritis with good effect (06/2007)  . LAPAROSCOPY FOR ECTOPIC PREGNANCY    . NM MYOVIEW LTD  2011   Dr Daneen Schick, III (Card)  . RECTOCELE REPAIR  1998   Dr Gertie Fey (GYN)    Family History  Problem Relation Age of Onset  . Diabetes type II    . Hypertension    . Heart attack    . Alcohol abuse    . Depression    . Asthma    . Migraines     Social History:  reports that she quit smoking about 8 years ago. Her smoking use included Cigarettes. She  has a 66.00 pack-year smoking history. She quit smokeless tobacco use about 8 years ago. She reports that she does not drink alcohol or use drugs.  Allergies:  Allergies  Allergen Reactions  . Aripiprazole     Doesn't want to take because of possible side effects  . Diclofenac-Misoprostol Other (See Comments)     shortness of breath and elevated blood pressure  . Emsam [Selegiline] Other (See Comments)    Burned the skin after using for a year  . Estradiol Swelling  . Ibuprofen Hives  . Naproxen Nausea Only  . Neurontin [Gabapentin] Hives and Swelling  . Paroxetine Other (See Comments)    Panic attacks  . Prednisone Other (See Comments)    Severe depression  . Tape Other (See Comments)  Paper tape caused blisters, plastic tape ok     (Not in a hospital admission)  No results found for this or any previous visit (from the past 48 hour(s)). No results found.  Review of Systems  Constitutional: Negative.   HENT: Negative.   Eyes: Negative.   Respiratory: Negative.   Cardiovascular: Negative.   Gastrointestinal: Negative.   Genitourinary: Negative.   Musculoskeletal: Positive for back pain.  Skin: Negative.   Neurological: Positive for sensory change and focal weakness.  Psychiatric/Behavioral: Negative.     There were no vitals taken for this visit. Physical Exam  Constitutional: She is oriented to person, place, and time. She appears well-developed.  HENT:  Head: Normocephalic.  Eyes: Pupils are equal, round, and reactive to light.  Neck: Normal range of motion.  Cardiovascular: Normal rate.   Respiratory: Effort normal.  GI: Soft.  Musculoskeletal:  On exam, healthy, in moderate distress, walks with an antalgic gait. Has a cane. Straight leg raise, buttock, thigh and calf pain, left knee, negative on the right. She does have quad weakness in the left compared to the right, pain with extension and flexion of the lumbar spine.  Lumbar spine exam reveals no  evidence of soft tissue swelling, deformity or skin ecchymosis. On palpation there is no tenderness of the lumbar spine. No flank pain with percussion. The abdomen is soft and nontender. Nontender over the trochanters. No cellulitis or lymphadenopathy.  Motor is 5/5 including EHL, tibialis anterior, plantar flexion, quadriceps and hamstrings. Patient is normoreflexic. There is no Babinski or clonus. Sensory exam is intact to light touch. Patient has good distal pulses. No DVT. No pain and normal range of motion without instability of the hips, knees and ankles.  Neurological: She is alert and oriented to person, place, and time.    Three-view radiographs of the lumbar spine demonstrates multilevel degeneration at L5-S1, L3-L4, L4-L5.  These are outside films that we have reviewed.  Radiographs with flexion and extension radiographs are obtained here. We see no instability on flexion, extension, but disc space narrowing at 4-5 and at 5-1.  The MRI demonstrates facet arthrosis at multiple levels. There is a large synovial cyst from the L4-5 facet extending into the foramen, compressing the L4 root.  Assessment/Plan 1. Recurrent L4 radiculopathy, myotomal weakness, dermatomal dysesthesias despite rest, activity modification, therapy and aspiration. 2. Elevated BMI. 3. Fibromyalgia. 4. Multilevel disc degeneration.  We had extensive discussion concerning current pathology, relevant anatomy and treatment option. At this point in time, she has options she can live with her symptoms, potentially reaspirate, although with return of her symptoms, it is most likely persistent, it is fairly large and extending in the 4 foramen and compression the 4 root with her quad weakness. We discussed lumbar decompression of 4-5 and excision of the cyst. I had an extensive discussion of the risks and benefits of the lumbar decompression with the patient including bleeding, infection, damage to neurovascular structures,  epidural fibrosis, CSF leak requiring repair. We also discussed increase in pain, adjacent segment disease, recurrent disc herniation, need for future surgery including repeat decompression and/or fusion. We also discussed risks of postoperative hematoma, paralysis, anesthetic complications including DVT, PE, death, cardiopulmonary dysfunction. In addition, the perioperative and postoperative courses were discussed in detail including the rehabilitative time and return to functional activity and work. I provided the patient with an illustrated handout and utilized the appropriate surgical models. She would like to proceed with that. We did though discuss possibility of this  even with lumbar decompression, recurrence of her cyst. She has multilevel disc degeneration. This would not undress back pain. She understands that. She has a history of MRSA and would require preoperative vancomycin. I recommend preoperative clearance. If she has any changes in the interim, she is to call, use a cane, railing up and down stairs and continue with her exercise program as best possible to strengthen her quad.  Plan microlumbar decompression L4-5, removal of synovial cyst  Cecilie Kicks., PA-C for Dr. Tonita Cong 05/29/2016, 8:58 AM

## 2016-06-12 ENCOUNTER — Encounter (HOSPITAL_COMMUNITY): Payer: Self-pay

## 2016-06-12 NOTE — Patient Instructions (Addendum)
[name]  06/12/2016   Your procedure is scheduled on: 06/21/2016    Report to [facility]  Entrance take [place]  elevators to 3rd floor to  [place] at    [place] AM.  Call this number if you have problems the morning of surgery [phone]   Remember: ONLY 1 PERSON MAY GO WITH YOU TO SHORT STAY TO GET  READY MORNING OF [place].  Do not eat food or drink liquids :After Midnight.     Take these medicines the morning of surgery with A SIP OF WATER: Allegra, Hydrocodone if needed, synthroid, Ativan, Prilosec, metoprolol ( Lopressor), Nasacort if needed                                 You may not have any metal on your body including hair pins and              piercings  Do not wear jewelry, make-up, lotions, powders or perfumes, deodorant             Do not wear nail polish.  Do not shave  48 hours prior to surgery.              Do not bring valuables to the hospital. [place].  Contacts, dentures or bridgework may not be worn into surgery.  Leave suitcase in the car. After surgery it may be brought to your room.       Special Instructions: coughing and deep breathing exercises, leg exercises               Please read over the following fact sheets you were given: _____________________________________________________________________             [facility] - Preparing for Surgery Before surgery, you can play an important role.  Because skin is not sterile, your skin needs to be as free of germs as possible.  You can reduce the number of germs on your skin by washing with CHG (chlorahexidine gluconate) soap before surgery.  CHG is an antiseptic cleaner which kills germs and bonds with the skin to continue killing germs even after washing. Please DO NOT use if you have an allergy to CHG or antibacterial soaps.  If your skin becomes reddened/irritated stop using the CHG and inform your nurse  when you arrive at Short Stay. Do not shave (including legs and underarms) for at least 48 hours prior to the first CHG shower.  You may shave your face/neck. Please follow these instructions carefully:  1.  Shower with CHG Soap the night before surgery and the  morning of Surgery.  2.  If you choose to wash your hair, wash your hair first as usual with your  normal  shampoo.  3.  After you shampoo, rinse your hair and body thoroughly to remove the  shampoo.                           4.  Use CHG as you would any other liquid soap.  You can apply chg directly  to the skin and wash  Gently with a scrungie or clean washcloth.  5.  Apply the CHG Soap to your body ONLY FROM THE NECK DOWN.   Do not use on face/ open                           Wound or open sores. Avoid contact with eyes, ears mouth and genitals (private parts).                       Wash face,  Genitals (private parts) with your normal soap.             6.  Wash thoroughly, paying special attention to the area where your surgery  will be performed.  7.  Thoroughly rinse your body with warm water from the neck down.  8.  DO NOT shower/wash with your normal soap after using and rinsing off  the CHG Soap.                9.  Pat yourself dry with a clean towel.            10.  Wear clean pajamas.            11.  Place clean sheets on your bed the night of your first shower and do not  sleep with pets. Day of Surgery : Do not apply any lotions/deodorants the morning of surgery.  Please wear clean clothes to the hospital/surgery center.  FAILURE TO FOLLOW THESE INSTRUCTIONS MAY RESULT IN THE CANCELLATION OF YOUR SURGERY PATIENT SIGNATURE_________________________________  NURSE SIGNATURE__________________________________  ________________________________________________________________________  WHAT IS A BLOOD TRANSFUSION? Blood Transfusion Information  A transfusion is the replacement of blood or some of its  parts. Blood is made up of multiple cells which provide different functions.  Red blood cells carry oxygen and are used for blood loss replacement.  White blood cells fight against infection.  Platelets control bleeding.  Plasma helps clot blood.  Other blood products are available for specialized needs, such as hemophilia or other clotting disorders. BEFORE THE TRANSFUSION  Who gives blood for transfusions?   Healthy volunteers who are fully evaluated to make sure their blood is safe. This is blood bank blood. Transfusion therapy is the safest it has ever been in the practice of medicine. Before blood is taken from a donor, a complete history is taken to make sure that person has no history of diseases nor engages in risky social behavior (examples are intravenous drug use or sexual activity with multiple partners). The donor's travel history is screened to minimize risk of transmitting infections, such as malaria. The donated blood is tested for signs of infectious diseases, such as HIV and hepatitis. The blood is then tested to be sure it is compatible with you in order to minimize the chance of a transfusion reaction. If you or a relative donates blood, this is often done in anticipation of surgery and is not appropriate for emergency situations. It takes many days to process the donated blood. RISKS AND COMPLICATIONS Although transfusion therapy is very safe and saves many lives, the main dangers of transfusion include:   Getting an infectious disease.  Developing a transfusion reaction. This is an allergic reaction to something in the blood you were given. Every precaution is taken to prevent this. The decision to have a blood transfusion has been considered carefully by your caregiver before blood is given. Blood is not given unless the benefits outweigh  the risks. AFTER THE TRANSFUSION  Right after receiving a blood transfusion, you will usually feel much better and more energetic.  This is especially true if your red blood cells have gotten low (anemic). The transfusion raises the level of the red blood cells which carry oxygen, and this usually causes an energy increase.  The nurse administering the transfusion will monitor you carefully for complications. HOME CARE INSTRUCTIONS  No special instructions are needed after a transfusion. You may find your energy is better. Speak with your caregiver about any limitations on activity for underlying diseases you may have. SEEK MEDICAL CARE IF:   Your condition is not improving after your transfusion.  You develop redness or irritation at the intravenous (IV) site. SEEK IMMEDIATE MEDICAL CARE IF:  Any of the following symptoms occur over the next 12 hours:  Shaking chills.  You have a temperature by mouth above 102 F (38.9 C), not controlled by medicine.  Chest, back, or muscle pain.  People around you feel you are not acting correctly or are confused.  Shortness of breath or difficulty breathing.  Dizziness and fainting.  You get a rash or develop hives.  You have a decrease in urine output.  Your urine turns a dark color or changes to pink, red, or brown. Any of the following symptoms occur over the next 10 days:  You have a temperature by mouth above 102 F (38.9 C), not controlled by medicine.  Shortness of breath.  Weakness after normal activity.  The white part of the eye turns yellow (jaundice).  You have a decrease in the amount of urine or are urinating less often.  Your urine turns a dark color or changes to pink, red, or brown. Document Released: 10/20/2000 Document Revised: 01/15/2012 Document Reviewed: 06/08/2008 ExitCare Patient Information 2014 [place].  _______________________________________________________________________  Incentive Spirometer  An incentive spirometer is a tool that can help keep your lungs clear and active. This tool measures how well you are filling  your lungs with each breath. Taking long deep breaths may help reverse or decrease the chance of developing breathing (pulmonary) problems (especially infection) following:  A long period of time when you are unable to move or be active. BEFORE THE PROCEDURE   If the spirometer includes an indicator to show your best effort, your nurse or respiratory therapist will set it to a desired goal.  If possible, sit up straight or lean slightly forward. Try not to slouch.  Hold the incentive spirometer in an upright position. INSTRUCTIONS FOR USE  1. Sit on the edge of your bed if possible, or sit up as far as you can in bed or on a chair. 2. Hold the incentive spirometer in an upright position. 3. Breathe out normally. 4. Place the mouthpiece in your mouth and seal your lips tightly around it. 5. Breathe in slowly and as deeply as possible, raising the piston or the ball toward the top of the column. 6. Hold your breath for 3-5 seconds or for as long as possible. Allow the piston or ball to fall to the bottom of the column. 7. Remove the mouthpiece from your mouth and breathe out normally. 8. Rest for a few seconds and repeat Steps 1 through 7 at least 10 times every 1-2 hours when you are awake. Take your time and take a few normal breaths between deep breaths. 9. The spirometer may include an indicator to show your best effort. Use the indicator as a goal to work  toward during each repetition. 10. After each set of 10 deep breaths, practice coughing to be sure your lungs are clear. If you have an incision (the cut made at the time of surgery), support your incision when coughing by placing a pillow or rolled up towels firmly against it. Once you are able to get out of bed, walk around indoors and cough well. You may stop using the incentive spirometer when instructed by your caregiver.  RISKS AND COMPLICATIONS  Take your time so you do not get dizzy or light-headed.  If you are in pain, you may  need to take or ask for pain medication before doing incentive spirometry. It is harder to take a deep breath if you are having pain. AFTER USE  Rest and breathe slowly and easily.  It can be helpful to keep track of a log of your progress. Your caregiver can provide you with a simple table to help with this. If you are using the spirometer at home, follow these instructions: [place] IF:   You are having difficultly using the spirometer.  You have trouble using the spirometer as often as instructed.  Your pain medication is not giving enough relief while using the spirometer.  You develop fever of 100.5 F (38.1 C) or higher. SEEK IMMEDIATE MEDICAL CARE IF:   You cough up bloody sputum that had not been present before.  You develop fever of 102 F (38.9 C) or greater.  You develop worsening pain at or near the incision site. MAKE SURE YOU:   Understand these instructions.  Will watch your condition.  Will get help right away if you are not doing well or get worse. Document Released: 03/05/2007 Document Revised: 01/15/2012 Document Reviewed: 05/06/2007 [facility] Patient Information 2014 [place], [place].   ________________________________________________________________________

## 2016-06-14 ENCOUNTER — Encounter (HOSPITAL_COMMUNITY)
Admission: RE | Admit: 2016-06-14 | Discharge: 2016-06-14 | Disposition: A | Discharge status: Home / Self Care | Payer: Federal, State, Local not specified - PPO | Source: Ambulatory Visit | Attending: Specialist | Admitting: Specialist

## 2016-06-14 ENCOUNTER — Encounter (HOSPITAL_COMMUNITY): Payer: Self-pay

## 2016-06-14 ENCOUNTER — Ambulatory Visit (HOSPITAL_COMMUNITY)
Admission: RE | Admit: 2016-06-14 | Discharge: 2016-06-14 | Disposition: A | Discharge status: Home / Self Care | Payer: Federal, State, Local not specified - PPO | Source: Ambulatory Visit | Attending: Orthopedic Surgery | Admitting: Orthopedic Surgery

## 2016-06-14 DIAGNOSIS — M48061 Spinal stenosis, lumbar region without neurogenic claudication: Secondary | ICD-10-CM

## 2016-06-14 DIAGNOSIS — Z01818 Encounter for other preprocedural examination: Secondary | ICD-10-CM | POA: Diagnosis not present

## 2016-06-14 DIAGNOSIS — M4806 Spinal stenosis, lumbar region: Secondary | ICD-10-CM | POA: Insufficient documentation

## 2016-06-14 DIAGNOSIS — I10 Essential (primary) hypertension: Secondary | ICD-10-CM | POA: Diagnosis not present

## 2016-06-14 DIAGNOSIS — I700 Atherosclerosis of aorta: Secondary | ICD-10-CM | POA: Insufficient documentation

## 2016-06-14 HISTORY — DX: Unspecified cataract: H26.9

## 2016-06-14 HISTORY — DX: Stress incontinence (female) (male): N39.3

## 2016-06-14 HISTORY — DX: Anxiety disorder, unspecified: F41.9

## 2016-06-14 HISTORY — DX: Anemia, unspecified: D64.9

## 2016-06-14 LAB — CBC
HEMATOCRIT: 41.5 % (ref 36.0–46.0)
HEMOGLOBIN: 14.2 g/dL (ref 12.0–15.0)
MCH: 30.3 pg (ref 26.0–34.0)
MCHC: 34.2 g/dL (ref 30.0–36.0)
MCV: 88.7 fL (ref 78.0–100.0)
Platelets: 245 10*3/uL (ref 150–400)
RBC: 4.68 MIL/uL (ref 3.87–5.11)
RDW: 12.4 % (ref 11.5–15.5)
WBC: 7.3 10*3/uL (ref 4.0–10.5)

## 2016-06-14 LAB — ABO/RH: ABO/RH(D): O POS

## 2016-06-14 LAB — BASIC METABOLIC PANEL
ANION GAP: 9 (ref 5–15)
BUN: 15 mg/dL (ref 6–20)
CHLORIDE: 98 mmol/L — AB (ref 101–111)
CO2: 26 mmol/L (ref 22–32)
Calcium: 8.8 mg/dL — ABNORMAL LOW (ref 8.9–10.3)
Creatinine, Ser: 0.84 mg/dL (ref 0.44–1.00)
GFR calc Af Amer: >60 mL/min (ref >60)
GFR calc non Af Amer: >60 mL/min (ref >60)
GLUCOSE: 97 mg/dL (ref 65–99)
POTASSIUM: 4.1 mmol/L (ref 3.5–5.1)
Sodium: 133 mmol/L — ABNORMAL LOW (ref 135–145)

## 2016-06-14 LAB — SURGICAL PCR SCREEN
MRSA, PCR: NEGATIVE
STAPHYLOCOCCUS AUREUS: NEGATIVE

## 2016-06-14 NOTE — Progress Notes (Signed)
Final EKLG done 06/14/16- EPIC

## 2016-06-14 NOTE — Progress Notes (Signed)
Dr Landry Dyke shown EKG from 06/14/2016 along with EKGS from 2013 and 2015 .  No further orders given.

## 2016-06-15 NOTE — Progress Notes (Signed)
Patient informed me at time of previous phone call that she was started on new medicine by pain management , MD of Morphine 15 mg oral 2 times daily which patient started on 06/14/2016 by patient.  I added to medication list and told her she could either take Hydrocodone am of surgery or Morphine for pain but not both .  Patient voiced understanding.

## 2016-06-15 NOTE — Progress Notes (Signed)
I received a note from Chart Room Nurse, Darden Amber, RN stating patient stated there [facility] did not have prescription for Mupirocin ointment.  Patient called [place] and spoke with Judeen Hammans and Judeen Hammans called and spoke with Kenney Houseman.  I called patient and she stated she went to get Mupirocin ointment Dr Tonita Cong had called for her and [facility] did not have prescription.  I informed patient that I did not call prescription in that Dr Tonita Cong would have to call prescription in.  I informed patient that I would call [place] and speak with Judeen Hammans , Surgery Scheduler at office and tell them they would have to recall in the prescription for the Mupirocin ointment.  I left Orson Slick a message regarding above.  I also called back to the office to see if DR Bean or PA in office and they are off today and tomorrow.  I called the patient back to let her know I had called the office and left a message with Orson Slick, Surgery Scheduler.

## 2016-06-17 ENCOUNTER — Telehealth: Payer: Self-pay | Admitting: Student

## 2016-06-17 NOTE — Telephone Encounter (Signed)
Patient called regarding pain in her hip. Her pain management doctor gave er morphine for pain which she feels helped her pain a great deal. She has been more active today because of this. She denies falls or injury today. Now that her morphine is wearing off and she is not yet due for another dose she is having more pain. She is set to have surgery next week and is concerned that this increased pain will affect her surgery date. I informed her that she should call her surgeon if she is truly concerned and may take her pain medication as prescribed for the pain likely due to increased activity

## 2016-06-20 MED ORDER — VANCOMYCIN HCL 10 G IV SOLR
1500.0000 mg | INTRAVENOUS | Status: AC
  Administered 2016-06-21 (×2): 1500 mg via INTRAVENOUS
  Filled 2016-06-20: qty 1500

## 2016-06-21 ENCOUNTER — Ambulatory Visit (HOSPITAL_COMMUNITY): Payer: Federal, State, Local not specified - PPO | Admitting: Anesthesiology

## 2016-06-21 ENCOUNTER — Ambulatory Visit (HOSPITAL_COMMUNITY)
Admission: RE | Admit: 2016-06-21 | Discharge: 2016-06-23 | Disposition: A | Discharge status: Home / Self Care | Payer: Federal, State, Local not specified - PPO | Source: Ambulatory Visit | Attending: Specialist | Admitting: Specialist

## 2016-06-21 ENCOUNTER — Ambulatory Visit (HOSPITAL_COMMUNITY): Payer: Federal, State, Local not specified - PPO

## 2016-06-21 ENCOUNTER — Encounter (HOSPITAL_COMMUNITY)
Admission: RE | Disposition: A | Discharge status: Home / Self Care | Payer: Self-pay | Source: Ambulatory Visit | Attending: Specialist

## 2016-06-21 ENCOUNTER — Other Ambulatory Visit: Payer: Self-pay | Admitting: *Deleted

## 2016-06-21 ENCOUNTER — Encounter (HOSPITAL_COMMUNITY): Payer: Self-pay | Admitting: *Deleted

## 2016-06-21 DIAGNOSIS — K219 Gastro-esophageal reflux disease without esophagitis: Secondary | ICD-10-CM | POA: Insufficient documentation

## 2016-06-21 DIAGNOSIS — Z79899 Other long term (current) drug therapy: Secondary | ICD-10-CM | POA: Diagnosis not present

## 2016-06-21 DIAGNOSIS — I739 Peripheral vascular disease, unspecified: Secondary | ICD-10-CM | POA: Insufficient documentation

## 2016-06-21 DIAGNOSIS — J449 Chronic obstructive pulmonary disease, unspecified: Secondary | ICD-10-CM | POA: Insufficient documentation

## 2016-06-21 DIAGNOSIS — J309 Allergic rhinitis, unspecified: Secondary | ICD-10-CM | POA: Diagnosis not present

## 2016-06-21 DIAGNOSIS — Z6841 Body Mass Index (BMI) 40.0 and over, adult: Secondary | ICD-10-CM | POA: Diagnosis not present

## 2016-06-21 DIAGNOSIS — I10 Essential (primary) hypertension: Secondary | ICD-10-CM | POA: Insufficient documentation

## 2016-06-21 DIAGNOSIS — F419 Anxiety disorder, unspecified: Secondary | ICD-10-CM | POA: Insufficient documentation

## 2016-06-21 DIAGNOSIS — M7138 Other bursal cyst, other site: Secondary | ICD-10-CM | POA: Insufficient documentation

## 2016-06-21 DIAGNOSIS — M4806 Spinal stenosis, lumbar region: Secondary | ICD-10-CM | POA: Diagnosis not present

## 2016-06-21 DIAGNOSIS — M48061 Spinal stenosis, lumbar region without neurogenic claudication: Secondary | ICD-10-CM | POA: Diagnosis present

## 2016-06-21 DIAGNOSIS — M5441 Lumbago with sciatica, right side: Secondary | ICD-10-CM

## 2016-06-21 DIAGNOSIS — F329 Major depressive disorder, single episode, unspecified: Secondary | ICD-10-CM | POA: Insufficient documentation

## 2016-06-21 DIAGNOSIS — Z419 Encounter for procedure for purposes other than remedying health state, unspecified: Secondary | ICD-10-CM

## 2016-06-21 DIAGNOSIS — E039 Hypothyroidism, unspecified: Secondary | ICD-10-CM | POA: Diagnosis not present

## 2016-06-21 DIAGNOSIS — M5442 Lumbago with sciatica, left side: Secondary | ICD-10-CM

## 2016-06-21 HISTORY — PX: LUMBAR LAMINECTOMY/DECOMPRESSION MICRODISCECTOMY: SHX5026

## 2016-06-21 LAB — TYPE AND SCREEN
ABO/RH(D): O POS
ANTIBODY SCREEN: NEGATIVE

## 2016-06-21 SURGERY — LUMBAR LAMINECTOMY/DECOMPRESSION MICRODISCECTOMY 1 LEVEL
Anesthesia: General | Site: Back

## 2016-06-21 MED ORDER — PROPOFOL 10 MG/ML IV BOLUS
INTRAVENOUS | Status: AC
Start: 2016-06-21 — End: 2016-06-21
  Filled 2016-06-21: qty 20

## 2016-06-21 MED ORDER — OXYCODONE HCL 5 MG PO TABS: 5.0000 mg | ORAL_TABLET | Freq: Once | ORAL | Status: DC | PRN

## 2016-06-21 MED ORDER — ADULT MULTIVITAMIN W/MINERALS CH
1.0000 | ORAL_TABLET | Freq: Every day | ORAL | Status: DC
  Administered 2016-06-22 – 2016-06-23 (×2): 1 via ORAL
  Filled 2016-06-21 (×2): qty 1

## 2016-06-21 MED ORDER — LABETALOL HCL 5 MG/ML IV SOLN
INTRAVENOUS | Status: DC | PRN
  Administered 2016-06-21: 5 mg via INTRAVENOUS

## 2016-06-21 MED ORDER — LORAZEPAM 1 MG PO TABS
1.0000 mg | ORAL_TABLET | Freq: Two times a day (BID) | ORAL | Status: DC
  Administered 2016-06-21 – 2016-06-22 (×4): 1 mg via ORAL
  Filled 2016-06-21 (×4): qty 1

## 2016-06-21 MED ORDER — ACETAMINOPHEN 325 MG PO TABS: 650.0000 mg | ORAL_TABLET | ORAL | Status: DC | PRN

## 2016-06-21 MED ORDER — CITALOPRAM HYDROBROMIDE 20 MG PO TABS: 40.0000 mg | ORAL_TABLET | Freq: Every day | ORAL | Status: DC

## 2016-06-21 MED ORDER — POTASSIUM CHLORIDE CRYS ER 20 MEQ PO TBCR
40.0000 meq | EXTENDED_RELEASE_TABLET | Freq: Once | ORAL | Status: AC
  Administered 2016-06-21: 40 meq via ORAL
  Filled 2016-06-21: qty 2

## 2016-06-21 MED ORDER — MENTHOL 3 MG MT LOZG
1.0000 | LOZENGE | OROMUCOSAL | Status: DC | PRN
Start: 2016-06-21 — End: 2016-06-23

## 2016-06-21 MED ORDER — LACTATED RINGERS IV SOLN
INTRAVENOUS | Status: DC
  Administered 2016-06-21: 10:00:00 via INTRAVENOUS

## 2016-06-21 MED ORDER — BUPIVACAINE-EPINEPHRINE (PF) 0.5% -1:200000 IJ SOLN
INTRAMUSCULAR | Status: AC
  Filled 2016-06-21: qty 30

## 2016-06-21 MED ORDER — PROPOFOL 10 MG/ML IV BOLUS
INTRAVENOUS | Status: AC
  Filled 2016-06-21: qty 20

## 2016-06-21 MED ORDER — PHENOL 1.4 % MT LIQD
1.0000 | OROMUCOSAL | Status: DC | PRN
  Filled 2016-06-21: qty 177

## 2016-06-21 MED ORDER — VITAMIN B-12 1000 MCG PO TABS
1000.0000 ug | ORAL_TABLET | Freq: Every day | ORAL | Status: DC
  Administered 2016-06-22 – 2016-06-23 (×2): 1000 ug via ORAL
  Filled 2016-06-21 (×2): qty 1

## 2016-06-21 MED ORDER — TIZANIDINE HCL 2 MG PO TABS
2.0000 mg | ORAL_TABLET | Freq: Two times a day (BID) | ORAL | Status: DC | PRN
  Filled 2016-06-21: qty 1

## 2016-06-21 MED ORDER — MORPHINE SULFATE ER 15 MG PO TBCR
15.0000 mg | EXTENDED_RELEASE_TABLET | Freq: Two times a day (BID) | ORAL | Status: DC
  Administered 2016-06-21 – 2016-06-23 (×4): 15 mg via ORAL
  Filled 2016-06-21 (×4): qty 1

## 2016-06-21 MED ORDER — CALCIUM CARBONATE-VITAMIN D 500-200 MG-UNIT PO TABS
1.0000 | ORAL_TABLET | Freq: Every day | ORAL | Status: DC
  Administered 2016-06-22 – 2016-06-23 (×2): 1 via ORAL
  Filled 2016-06-21 (×2): qty 1

## 2016-06-21 MED ORDER — RISAQUAD PO CAPS
1.0000 | ORAL_CAPSULE | Freq: Every day | ORAL | Status: DC
  Administered 2016-06-22 – 2016-06-23 (×2): 1 via ORAL
  Filled 2016-06-21 (×2): qty 1

## 2016-06-21 MED ORDER — ONDANSETRON HCL 4 MG/2ML IJ SOLN
INTRAMUSCULAR | Status: AC
  Filled 2016-06-21: qty 2

## 2016-06-21 MED ORDER — FENTANYL CITRATE (PF) 100 MCG/2ML IJ SOLN
INTRAMUSCULAR | Status: DC | PRN
  Administered 2016-06-21 (×2): 100 ug via INTRAVENOUS
  Administered 2016-06-21: 50 ug via INTRAVENOUS

## 2016-06-21 MED ORDER — POLYETHYLENE GLYCOL 3350 17 G PO PACK
17.0000 g | PACK | Freq: Every day | ORAL | Status: DC | PRN
  Filled 2016-06-21: qty 1

## 2016-06-21 MED ORDER — BISACODYL 5 MG PO TBEC: 5.0000 mg | DELAYED_RELEASE_TABLET | Freq: Every day | ORAL | Status: DC | PRN

## 2016-06-21 MED ORDER — LEVOTHYROXINE SODIUM 50 MCG PO TABS
50.0000 ug | ORAL_TABLET | Freq: Every day | ORAL | Status: DC
  Administered 2016-06-22 – 2016-06-23 (×2): 50 ug via ORAL
  Filled 2016-06-21 (×2): qty 1

## 2016-06-21 MED ORDER — BUPIVACAINE-EPINEPHRINE 0.5% -1:200000 IJ SOLN
INTRAMUSCULAR | Status: DC | PRN
  Administered 2016-06-21: 20 mL

## 2016-06-21 MED ORDER — OXYCODONE HCL 5 MG/5ML PO SOLN: 5.0000 mg | Freq: Once | ORAL | Status: DC | PRN

## 2016-06-21 MED ORDER — PROPOFOL 10 MG/ML IV BOLUS
INTRAVENOUS | Status: DC | PRN
  Administered 2016-06-21: 200 mg via INTRAVENOUS

## 2016-06-21 MED ORDER — HYDROMORPHONE HCL 1 MG/ML IJ SOLN
0.2500 mg | INTRAMUSCULAR | Status: DC | PRN
  Administered 2016-06-21 (×2): 0.5 mg via INTRAVENOUS

## 2016-06-21 MED ORDER — ONDANSETRON HCL 4 MG/2ML IJ SOLN
INTRAMUSCULAR | Status: DC | PRN
  Administered 2016-06-21: 4 mg via INTRAVENOUS

## 2016-06-21 MED ORDER — MAGNESIUM CITRATE PO SOLN: 1.0000 | Freq: Once | ORAL | Status: DC | PRN

## 2016-06-21 MED ORDER — TRAZODONE HCL 50 MG PO TABS
300.0000 mg | ORAL_TABLET | Freq: Every day | ORAL | Status: DC
  Administered 2016-06-21 – 2016-06-22 (×2): 300 mg via ORAL
  Filled 2016-06-21 (×2): qty 6

## 2016-06-21 MED ORDER — HYDROMORPHONE HCL 1 MG/ML IJ SOLN
INTRAMUSCULAR | Status: AC
  Administered 2016-06-21: 0.5 mg via INTRAVENOUS
  Filled 2016-06-21: qty 1

## 2016-06-21 MED ORDER — LIDOCAINE HCL (CARDIAC) 20 MG/ML IV SOLN
INTRAVENOUS | Status: DC | PRN
  Administered 2016-06-21: 75 mg via INTRAVENOUS
  Administered 2016-06-21: 25 mg via INTRATRACHEAL

## 2016-06-21 MED ORDER — PROMETHAZINE HCL 25 MG/ML IJ SOLN: 6.2500 mg | INTRAMUSCULAR | Status: DC | PRN

## 2016-06-21 MED ORDER — METHOCARBAMOL 500 MG PO TABS
500.0000 mg | ORAL_TABLET | Freq: Four times a day (QID) | ORAL | Status: DC | PRN
  Administered 2016-06-21 – 2016-06-23 (×5): 500 mg via ORAL
  Filled 2016-06-21 (×5): qty 1

## 2016-06-21 MED ORDER — SUGAMMADEX SODIUM 500 MG/5ML IV SOLN
INTRAVENOUS | Status: AC
  Filled 2016-06-21: qty 5

## 2016-06-21 MED ORDER — LORATADINE 10 MG PO TABS
10.0000 mg | ORAL_TABLET | Freq: Every day | ORAL | Status: DC
  Administered 2016-06-22 – 2016-06-23 (×2): 10 mg via ORAL
  Filled 2016-06-21 (×2): qty 1

## 2016-06-21 MED ORDER — KETAMINE HCL 10 MG/ML IJ SOLN
INTRAMUSCULAR | Status: DC | PRN
  Administered 2016-06-21: 60 mg via INTRAVENOUS

## 2016-06-21 MED ORDER — ALUM & MAG HYDROXIDE-SIMETH 200-200-20 MG/5ML PO SUSP
30.0000 mL | Freq: Four times a day (QID) | ORAL | Status: DC | PRN
  Administered 2016-06-22 (×2): 30 mL via ORAL
  Filled 2016-06-21 (×2): qty 30

## 2016-06-21 MED ORDER — TRIAMCINOLONE ACETONIDE 55 MCG/ACT NA AERO
2.0000 | INHALATION_SPRAY | Freq: Every day | NASAL | Status: DC | PRN
  Filled 2016-06-21: qty 21.6

## 2016-06-21 MED ORDER — HYDROCODONE-ACETAMINOPHEN 5-325 MG PO TABS
1.0000 | ORAL_TABLET | ORAL | Status: DC | PRN
  Administered 2016-06-21: 2 via ORAL
  Administered 2016-06-21 (×2): 1 via ORAL
  Administered 2016-06-22 – 2016-06-23 (×9): 2 via ORAL
  Filled 2016-06-21 (×6): qty 2
  Filled 2016-06-21: qty 1
  Filled 2016-06-21 (×3): qty 2
  Filled 2016-06-21: qty 1
  Filled 2016-06-21: qty 2

## 2016-06-21 MED ORDER — SODIUM CHLORIDE 0.9 % IR SOLN
Status: AC
  Filled 2016-06-21: qty 1

## 2016-06-21 MED ORDER — KETAMINE HCL 10 MG/ML IJ SOLN
INTRAMUSCULAR | Status: AC
  Filled 2016-06-21: qty 1

## 2016-06-21 MED ORDER — HYDROXYZINE HCL 50 MG PO TABS
150.0000 mg | ORAL_TABLET | Freq: Every day | ORAL | Status: DC
  Administered 2016-06-21 – 2016-06-22 (×2): 150 mg via ORAL
  Filled 2016-06-21 (×2): qty 3

## 2016-06-21 MED ORDER — HYDROMORPHONE HCL 1 MG/ML IJ SOLN: 0.5000 mg | INTRAMUSCULAR | Status: DC | PRN

## 2016-06-21 MED ORDER — ALBUTEROL SULFATE (2.5 MG/3ML) 0.083% IN NEBU: 3.0000 mL | INHALATION_SOLUTION | Freq: Four times a day (QID) | RESPIRATORY_TRACT | Status: DC | PRN

## 2016-06-21 MED ORDER — LACTATED RINGERS IV SOLN
INTRAVENOUS | Status: DC | PRN
  Administered 2016-06-21 (×2): via INTRAVENOUS

## 2016-06-21 MED ORDER — KCL IN DEXTROSE-NACL 20-5-0.45 MEQ/L-%-% IV SOLN
INTRAVENOUS | Status: AC
  Administered 2016-06-21: 16:00:00 via INTRAVENOUS
  Filled 2016-06-21 (×2): qty 1000

## 2016-06-21 MED ORDER — DOCUSATE SODIUM 100 MG PO CAPS
100.0000 mg | ORAL_CAPSULE | Freq: Two times a day (BID) | ORAL | Status: DC
  Administered 2016-06-21 – 2016-06-23 (×4): 100 mg via ORAL
  Filled 2016-06-21 (×4): qty 1

## 2016-06-21 MED ORDER — PRAZOSIN HCL 1 MG PO CAPS
1.0000 mg | ORAL_CAPSULE | Freq: Every day | ORAL | Status: DC
  Administered 2016-06-21 – 2016-06-22 (×2): 1 mg via ORAL
  Filled 2016-06-21 (×2): qty 1

## 2016-06-21 MED ORDER — ONDANSETRON HCL 4 MG/2ML IJ SOLN
4.0000 mg | INTRAMUSCULAR | Status: DC | PRN
  Administered 2016-06-21: 4 mg via INTRAVENOUS
  Filled 2016-06-21: qty 2

## 2016-06-21 MED ORDER — DICYCLOMINE HCL 10 MG PO CAPS
20.0000 mg | ORAL_CAPSULE | Freq: Three times a day (TID) | ORAL | Status: DC
  Administered 2016-06-21 – 2016-06-23 (×7): 20 mg via ORAL
  Filled 2016-06-21 (×10): qty 2

## 2016-06-21 MED ORDER — MIDAZOLAM HCL 5 MG/5ML IJ SOLN
INTRAMUSCULAR | Status: DC | PRN
  Administered 2016-06-21: 2 mg via INTRAVENOUS

## 2016-06-21 MED ORDER — VANCOMYCIN HCL 10 G IV SOLR
1500.0000 mg | Freq: Once | INTRAVENOUS | Status: AC
  Administered 2016-06-21: 1500 mg via INTRAVENOUS
  Filled 2016-06-21: qty 1500

## 2016-06-21 MED ORDER — ROCURONIUM BROMIDE 100 MG/10ML IV SOLN
INTRAVENOUS | Status: DC | PRN
  Administered 2016-06-21: 10 mg via INTRAVENOUS
  Administered 2016-06-21: 50 mg via INTRAVENOUS

## 2016-06-21 MED ORDER — NITROGLYCERIN 0.4 MG SL SUBL: 0.4000 mg | SUBLINGUAL_TABLET | SUBLINGUAL | Status: DC | PRN

## 2016-06-21 MED ORDER — FENTANYL CITRATE (PF) 250 MCG/5ML IJ SOLN
INTRAMUSCULAR | Status: AC
  Filled 2016-06-21: qty 5

## 2016-06-21 MED ORDER — ACETAMINOPHEN 650 MG RE SUPP: 650.0000 mg | RECTAL | Status: DC | PRN

## 2016-06-21 MED ORDER — DEXTROSE 5 % IV SOLN
500.0000 mg | Freq: Four times a day (QID) | INTRAVENOUS | Status: DC | PRN
Start: 2016-06-21 — End: 2016-06-23
  Administered 2016-06-21: 500 mg via INTRAVENOUS
  Filled 2016-06-21: qty 5
  Filled 2016-06-21: qty 550

## 2016-06-21 MED ORDER — SUGAMMADEX SODIUM 500 MG/5ML IV SOLN
INTRAVENOUS | Status: DC | PRN
  Administered 2016-06-21: 250 mg via INTRAVENOUS

## 2016-06-21 MED ORDER — RAMIPRIL 10 MG PO CAPS
10.0000 mg | ORAL_CAPSULE | Freq: Every day | ORAL | Status: DC
  Administered 2016-06-21 – 2016-06-23 (×2): 10 mg via ORAL
  Filled 2016-06-21 (×3): qty 1

## 2016-06-21 MED ORDER — METOPROLOL TARTRATE 50 MG PO TABS
50.0000 mg | ORAL_TABLET | Freq: Two times a day (BID) | ORAL | Status: DC
  Administered 2016-06-22 – 2016-06-23 (×3): 50 mg via ORAL
  Filled 2016-06-21 (×3): qty 1

## 2016-06-21 MED ORDER — MIDAZOLAM HCL 2 MG/2ML IJ SOLN
INTRAMUSCULAR | Status: AC
  Filled 2016-06-21: qty 2

## 2016-06-21 MED ORDER — LIDOCAINE HCL (CARDIAC) 20 MG/ML IV SOLN
INTRAVENOUS | Status: AC
  Filled 2016-06-21: qty 10

## 2016-06-21 MED ORDER — OXYCODONE-ACETAMINOPHEN 5-325 MG PO TABS: 1.0000 | ORAL_TABLET | ORAL | Status: DC | PRN

## 2016-06-21 MED ORDER — PANTOPRAZOLE SODIUM 40 MG PO TBEC
40.0000 mg | DELAYED_RELEASE_TABLET | Freq: Every day | ORAL | Status: DC
  Administered 2016-06-22 – 2016-06-23 (×2): 40 mg via ORAL
  Filled 2016-06-21 (×2): qty 1

## 2016-06-21 MED ORDER — EPHEDRINE SULFATE 50 MG/ML IJ SOLN
INTRAMUSCULAR | Status: DC | PRN
  Administered 2016-06-21: 10 mg via INTRAVENOUS
  Administered 2016-06-21: 15 mg via INTRAVENOUS
  Administered 2016-06-21: 10 mg via INTRAVENOUS
  Administered 2016-06-21: 15 mg via INTRAVENOUS

## 2016-06-21 MED ORDER — PROCHLORPERAZINE MALEATE 10 MG PO TABS
10.0000 mg | ORAL_TABLET | Freq: Once | ORAL | Status: DC
  Filled 2016-06-21: qty 1

## 2016-06-21 MED ORDER — ACETAMINOPHEN 500 MG PO TABS: 1000.0000 mg | ORAL_TABLET | Freq: Two times a day (BID) | ORAL | Status: DC | PRN

## 2016-06-21 MED ORDER — LIDOCAINE HCL (CARDIAC) 20 MG/ML IV SOLN
INTRAVENOUS | Status: AC
  Filled 2016-06-21: qty 5

## 2016-06-21 SURGICAL SUPPLY — 41 items
AGENT HMST SPONGE THK3/8 (HEMOSTASIS) ×1
BAG SPEC THK2 15X12 ZIP CLS (MISCELLANEOUS)
BAG ZIPLOCK 12X15 (MISCELLANEOUS) IMPLANT
CLEANER TIP ELECTROSURG 2X2 (MISCELLANEOUS) ×2 IMPLANT
CLOTH 2% CHLOROHEXIDINE 3PK (PERSONAL CARE ITEMS) ×2 IMPLANT
DRAPE MICROSCOPE LEICA (MISCELLANEOUS) ×2 IMPLANT
DRAPE POUCH INSTRU U-SHP 10X18 (DRAPES) ×2 IMPLANT
DRAPE SHEET LG 3/4 BI-LAMINATE (DRAPES) ×2 IMPLANT
DRAPE SURG 17X11 SM STRL (DRAPES) ×2 IMPLANT
DRAPE UTILITY XL STRL (DRAPES) ×2 IMPLANT
DRSG AQUACEL AG ADV 3.5X 4 (GAUZE/BANDAGES/DRESSINGS) ×1 IMPLANT
DURAPREP 26ML APPLICATOR (WOUND CARE) ×2 IMPLANT
ELECT REM PT RETURN 9FT ADLT (ELECTROSURGICAL) ×2
ELECTRODE REM PT RTRN 9FT ADLT (ELECTROSURGICAL) ×1 IMPLANT
GLOVE BIOGEL PI IND STRL 7.0 (GLOVE) ×1 IMPLANT
GLOVE BIOGEL PI INDICATOR 7.0 (GLOVE) ×1
GLOVE SURG SS PI 7.0 STRL IVOR (GLOVE) ×2 IMPLANT
GLOVE SURG SS PI 7.5 STRL IVOR (GLOVE) ×2 IMPLANT
GLOVE SURG SS PI 8.0 STRL IVOR (GLOVE) ×4 IMPLANT
GOWN STRL REUS W/TWL XL LVL3 (GOWN DISPOSABLE) ×4 IMPLANT
HEMOSTAT SPONGE AVITENE ULTRA (HEMOSTASIS) ×1 IMPLANT
IV CATH 14GX2 1/4 (CATHETERS) ×2 IMPLANT
KIT BASIN OR (CUSTOM PROCEDURE TRAY) ×2 IMPLANT
KIT POSITIONING SURG ANDREWS (MISCELLANEOUS) ×2 IMPLANT
MANIFOLD NEPTUNE II (INSTRUMENTS) ×2 IMPLANT
NDL SPNL 18GX3.5 QUINCKE PK (NEEDLE) ×2 IMPLANT
NEEDLE SPNL 18GX3.5 QUINCKE PK (NEEDLE) ×4 IMPLANT
PACK LAMINECTOMY ORTHO (CUSTOM PROCEDURE TRAY) ×2 IMPLANT
PATTIES SURGICAL .5 X.5 (GAUZE/BANDAGES/DRESSINGS) ×1 IMPLANT
PATTIES SURGICAL .75X.75 (GAUZE/BANDAGES/DRESSINGS) ×2 IMPLANT
RUBBERBAND STERILE (MISCELLANEOUS) ×4 IMPLANT
SPONGE SURGIFOAM ABS GEL 100 (HEMOSTASIS) ×2 IMPLANT
STAPLER VISISTAT (STAPLE) IMPLANT
STRIP CLOSURE SKIN 1/2X4 (GAUZE/BANDAGES/DRESSINGS) ×2 IMPLANT
SUT BONE WAX W31G (SUTURE) ×1 IMPLANT
SUT VIC AB 1-0 CT2 27 (SUTURE) ×1 IMPLANT
SUT VIC AB 2-0 CT2 27 (SUTURE) ×1 IMPLANT
SYR 3ML LL SCALE MARK (SYRINGE) IMPLANT
TOWEL OR 17X26 10 PK STRL BLUE (TOWEL DISPOSABLE) ×2 IMPLANT
TOWEL OR NON WOVEN STRL DISP B (DISPOSABLE) ×1 IMPLANT
YANKAUER SUCT BULB TIP NO VENT (SUCTIONS) IMPLANT

## 2016-06-21 NOTE — Progress Notes (Signed)
Pharmacy Antibiotic Note  [name] is a 65 y.o. female admitted on 06/21/2016 for micro lumbar decompression and removal of synovial cyst.  Pharmacy has been consulted for Vancomycin dosing for post-op prophylaxis for one dose if no drains were placed.  First pre-op antibiotic prophylaxis, Vancomycin 1500mg  IV given at 1055 No post-op drains have been documented.  Plan:  Vancomycin 1500 mg IV x1 dose at 2300.  Pharmacy will sign off at this time.  Please reconsult if a change in clinical status warrants re-evaluation of dosage.   Height: 5\' 5"  (165.1 cm) Weight: 262 lb (118.8 kg) IBW/kg (Calculated) : 57  Temp (24hrs), Avg:97.7 F (36.5 C), Min:97.4 F (36.3 C), Max:97.9 F (36.6 C)  No results for input(s): WBC, CREATININE, LATICACIDVEN, VANCOTROUGH, VANCOPEAK, VANCORANDOM, GENTTROUGH, GENTPEAK, GENTRANDOM, TOBRATROUGH, TOBRAPEAK, TOBRARND, AMIKACINPEAK, AMIKACINTROU, AMIKACIN in the last 168 hours.  Estimated Creatinine Clearance: 86.1 mL/min (by C-G formula based on SCr of 0.84 mg/dL).    Allergies  Allergen Reactions  . Aripiprazole     Doesn't want to take because of possible side effects  . Diclofenac-Misoprostol Other (See Comments)     shortness of breath and elevated blood pressure  . Emsam [Selegiline] Other (See Comments)    Burned the skin after using for a year  . Estradiol Swelling  . Ibuprofen Hives  . Naproxen Nausea Only  . Neurontin [Gabapentin] Hives and Swelling  . Paroxetine Other (See Comments)    Panic attacks  . Prednisone Other (See Comments)    Severe depression  . Tape Other (See Comments)    Paper tape caused blisters, plastic tape ok    Thank you for allowing pharmacy to be a part of this patient's care.  Gretta Arab PharmD, BCPS Pager [phone] 06/21/2016 3:40 PM

## 2016-06-21 NOTE — Anesthesia Procedure Notes (Addendum)
Procedure Name: Intubation Date/Time: 06/21/2016 11:48 AM Performed by: Lissa Morales Pre-anesthesia Checklist: Patient identified, Emergency Drugs available, Suction available and Patient being monitored Patient Re-evaluated:Patient Re-evaluated prior to inductionOxygen Delivery Method: Circle system utilized Preoxygenation: Pre-oxygenation with 100% oxygen Intubation Type: IV induction Ventilation: Mask ventilation without difficulty Laryngoscope Size: Mac and 4 Grade View: Grade II Tube type: Oral Tube size: 7.0 mm Number of attempts: 1 Airway Equipment and Method: Stylet and Oral airway Placement Confirmation: ETT inserted through vocal cords under direct vision,  positive ETCO2 and breath sounds checked- equal and bilateral Secured at: 21 cm Tube secured with: Tape Dental Injury: Teeth and Oropharynx as per pre-operative assessment

## 2016-06-21 NOTE — Anesthesia Preprocedure Evaluation (Addendum)
Anesthesia Evaluation  Patient identified by MRN, date of birth, ID band Patient awake    Reviewed: Allergy & Precautions, NPO status , Patient's Chart, lab work & pertinent test results, reviewed documented beta blocker date and time   Airway Mallampati: II  TM Distance: >3 FB Neck ROM: Full    Dental  (+) Dental Advisory Given   Pulmonary COPD, former smoker,    breath sounds clear to auscultation       Cardiovascular hypertension, Pt. on medications and Pt. on home beta blockers + Peripheral Vascular Disease   Rhythm:Regular Rate:Normal     Neuro/Psych Anxiety Depression negative neurological ROS     GI/Hepatic Neg liver ROS, GERD  ,  Endo/Other  Hypothyroidism   Renal/GU negative Renal ROS     Musculoskeletal  (+) Arthritis , Fibromyalgia -, narcotic dependent  Abdominal   Peds  Hematology  (+) anemia ,   Anesthesia Other Findings   Reproductive/Obstetrics                            Lab Results  Component Value Date   WBC 7.3 06/14/2016   HGB 14.2 06/14/2016   HCT 41.5 06/14/2016   MCV 88.7 06/14/2016   PLT 245 06/14/2016   Lab Results  Component Value Date   CREATININE 0.84 06/14/2016   BUN 15 06/14/2016   NA 133 (L) 06/14/2016   K 4.1 06/14/2016   CL 98 (L) 06/14/2016   CO2 26 06/14/2016    Anesthesia Physical Anesthesia Plan  ASA: III  Anesthesia Plan: General   Post-op Pain Management:    Induction: Intravenous  Airway Management Planned: Oral ETT  Additional Equipment:   Intra-op Plan:   Post-operative Plan: Extubation in OR  Informed Consent: I have reviewed the patients History and Physical, chart, labs and discussed the procedure including the risks, benefits and alternatives for the proposed anesthesia with the patient or authorized representative who has indicated his/her understanding and acceptance.   Dental advisory given  Plan Discussed with:    Anesthesia Plan Comments:         Anesthesia Quick Evaluation

## 2016-06-21 NOTE — Transfer of Care (Signed)
Immediate Anesthesia Transfer of Care Note  Patient: [name]  Procedure(s) Performed: Procedure(s): MICRO LUMBAR DECOMPRESSION L4-L5 AND REMOVAL OF SYNOVIAL CYST    1 LEVEL (N/A)  Patient Location: PACU  Anesthesia Type:General  Level of Consciousness: sedated, patient cooperative and responds to stimulation  Airway & Oxygen Therapy: Patient Spontanous Breathing and Patient connected to face mask oxygen  Post-op Assessment: Report given to RN and Post -op Vital signs reviewed and stable  Post vital signs: Reviewed and stable  Last Vitals:  Vitals:   06/21/16 0918  BP: 139/79  Pulse: 69  Resp: 18  Temp: 36.6 C    Last Pain:  Vitals:   06/21/16 0952  TempSrc:   PainSc: 3       Patients Stated Pain Goal: 4 ([id number])  Complications: No apparent anesthesia complications

## 2016-06-21 NOTE — H&P (View-Only) (Signed)
[name] is an 65 y.o. female.   Chief Complaint: back and B/L leg pain HPI: The patient is a 65 year old female who presents for a Follow-up for Follow-up back. The patient is being followed for their back pain. They are now 7 week(s) out from aspiration and injection of synovial cyst at L4-5. Symptoms reported today include: pain, pain at night, aching, stiffness, pain with weightbearing, leg pain and pain with standing. The patient states that they are doing poorly. Current treatment includes: pain medications and heating pad. The following medication has been used for pain control: Hydrocodone. The patient reports their current pain level to be moderate. The patient has reported improvement of their symptoms with: heat. The patient indicates that they have questions or concerns today regarding pain and their progress at this point. Note for "Follow-up back": She starts pain managment on 05/31/16.  She reports seven weeks status post. She had good relief initially, now it has come back. This is occurrence. She has fibromyalgia. Family medicine doctor is referring her to pain management. She has chronic back pain, but this is mainly leg pain, worse with activity, better with rest. She sees Dr. Sherren Mocha McDiardmid  Past Medical History:  Diagnosis Date  . Adrenal nodule ([place]) 04/08/2011   04/08/11 Abdominal CT showed Left adrenal nodule which is tachnically indeterminate but most likely an adenoma.  It is 1.7 cm and 42 HU on portal venous phase image. 31 HU on delayed images.   01/09/2012 Abdominal CT w/ & w/o CM: Stable benign left adrenal adenoma. No further specific follow-up is needed for this finding.    . Allergic rhinitis 01/03/2007   Qualifier: Diagnosis of  By: Drucie Ip    . Carpal tunnel syndrome 02/14/2007   S/P carpel tunnel release bilaterally.    . Chronic pain syndrome 12/24/2008  . COPD WITHOUT EXACERBATION 01/03/2007   Qualifier: Diagnosis of  By: Drucie Ip    . DISC WITH  RADICULOPATHY 01/03/2007   Qualifier: Diagnosis of  By: Drucie Ip    . Esophageal reflux 01/03/2007   Centricity Description: GASTROESOPHAGEAL REFLUX, NO ESOPHAGITIS Qualifier: Diagnosis of  By: Drucie Ip   Centricity Description: GERD Qualifier: Diagnosis of  By: Tye Savoy MD, Tommi Rumps    . Functional gastrointestinal disorder 06/18/2008   Functional Gastrointestinal Disorder with frequent eructation and bloating sensation with acute flares.  Responsive to Compazine and Bentyl.  Takes a few days to calm down.    Marland Kitchen HEMORRHOIDS, NOS 01/03/2007   Qualifier: Diagnosis of  By: Drucie Ip    . Hepatic steatosis 04/12/2011  . Herpes simplex labialis 05/11/2011  . Hip osteoarthritis 05/23/2007   MRI Pelvis (03/13/07) Bilateral osteoarthritis of the hips, left greater than right.  The  patient has a slightly more prominent hip effusion on the left than  the right.  No other significant abnormality.    . History of MRSA infection 04/28/2011  . HYPERCHOLESTEROLEMIA 12/24/2008  . Hypertension   . HYPERTENSION, BENIGN SYSTEMIC 01/03/2007   Qualifier: Diagnosis of  By: Drucie Ip    . HYPERTRIGLYCERIDEMIA 11/11/2007   Qualifier: Diagnosis of  By: McDiarmid MD, Sherren Mocha    . Hypothyroidism 02/15/2007   Qualifier: Diagnosis of  By: McDiarmid MD, Sherren Mocha    . HYPOTHYROIDISM, BORDERLINE 02/15/2007  . Irritable bowel syndrome 01/03/2007   Qualifier: Diagnosis of  By: Drucie Ip    . LACTOSE INTOLERANCE 03/10/2008  . Major depressive disorder, recurrent episode ([place]) 01/03/2007   Qualifier: Diagnosis of  By:  Kivett, Whitney    . Obesity (BMI 30.0-34.9) 09/30/2007   Has lost 55 lbs since easter. Goal of <190 by 08/05/12.     . Obesity, Class III, BMI 40-49.9 (morbid obesity) ([place]) 09/30/2007       . Osteoarthritis of spine 01/03/2007   MR I Lumbar (08/03): GeneralDDD; L2-3 Large  HNP.  Mod pos  hnp - 12/14/2005, smhnpw/irritL4root - 12/14/2005    . OSTEOARTHRITIS, HANDS, BILATERAL 06/18/2008   Qualifier: Diagnosis  of  By: McDiarmid MD, Sherren Mocha    . PATELLO FEMORAL STRESS SYNDROME 01/03/2007   Qualifier: Diagnosis of  By: Drucie Ip    . Periodic limb movements of sleep 12/05/2010   Diagnosed on Polysomnography testing Fall 2011.     . Pre-diabetes 12/25/2008   Qualifier: Diagnosis of  By: McDiarmid MD, Sherren Mocha    . RESTLESS LEGS SYNDROME 09/11/2007   Polysomnography (10/2010, Dr Keturah Barre, interpreter. ) showed periodic limb movements that frequently awoke patient from sleep with arousal index of 4 per hour. Dr Danton Sewer (Sleep Specialist) thought her RLS was the major contributor to patient poor sleep condition, more than any sleep apnea.  He recommended considering opamine agonist (either requip or mirapex) to see if this will help.     . Rosacea, acne 10/24/2011  . SIGMOID POLYP 01/23/2008  . Solitary lung nodule 04/08/2011  . Urticaria, chronic 05/22/2011  . VITAMIN D DEFICIENCY 03/03/2010    Past Surgical History:  Procedure Laterality Date  . bladder tack  1998   Dr Jeffie Pollock (urology)  . BUNIONECTOMY  1992   bilateral feet with pins in great toes  . CARPAL TUNNEL RELEASE  2010   Bilateral, Dr Theodis Sato  . COLONOSCOPY  2004   Dr Verdia Kuba (GI)  . CYSTOCELE REPAIR  1998    Dr Gertie Fey (GYN)-  . ESOPHAGOGASTRODUODENOSCOPY    . EXPLORATORY LAPAROTOMY    . FACIAL LACERATIONS REPAIR     Facial Laceration repair as adolescent   . INJECTION HIP INTRA ARTICULAR  2008   Left Hip fluoroscopically-guided corticosteorid injection for painful osteoarthritis with good effect (06/2007)  . LAPAROSCOPY FOR ECTOPIC PREGNANCY    . NM MYOVIEW LTD  2011   Dr Daneen Schick, III (Card)  . RECTOCELE REPAIR  1998   Dr Gertie Fey (GYN)    Family History  Problem Relation Age of Onset  . Diabetes type II    . Hypertension    . Heart attack    . Alcohol abuse    . Depression    . Asthma    . Migraines     Social History:  reports that she quit smoking about 8 years ago. Her smoking use included Cigarettes. She  has a 66.00 pack-year smoking history. She quit smokeless tobacco use about 8 years ago. She reports that she does not drink alcohol or use drugs.  Allergies:  Allergies  Allergen Reactions  . Aripiprazole     Doesn't want to take because of possible side effects  . Diclofenac-Misoprostol Other (See Comments)     shortness of breath and elevated blood pressure  . Emsam [Selegiline] Other (See Comments)    Burned the skin after using for a year  . Estradiol Swelling  . Ibuprofen Hives  . Naproxen Nausea Only  . Neurontin [Gabapentin] Hives and Swelling  . Paroxetine Other (See Comments)    Panic attacks  . Prednisone Other (See Comments)    Severe depression  . Tape Other (See Comments)  Paper tape caused blisters, plastic tape ok     (Not in a hospital admission)  No results found for this or any previous visit (from the past 48 hour(s)). No results found.  Review of Systems  Constitutional: Negative.   HENT: Negative.   Eyes: Negative.   Respiratory: Negative.   Cardiovascular: Negative.   Gastrointestinal: Negative.   Genitourinary: Negative.   Musculoskeletal: Positive for back pain.  Skin: Negative.   Neurological: Positive for sensory change and focal weakness.  Psychiatric/Behavioral: Negative.     There were no vitals taken for this visit. Physical Exam  Constitutional: She is oriented to person, place, and time. She appears well-developed.  HENT:  Head: Normocephalic.  Eyes: Pupils are equal, round, and reactive to light.  Neck: Normal range of motion.  Cardiovascular: Normal rate.   Respiratory: Effort normal.  GI: Soft.  Musculoskeletal:  On exam, healthy, in moderate distress, walks with an antalgic gait. Has a cane. Straight leg raise, buttock, thigh and calf pain, left knee, negative on the right. She does have quad weakness in the left compared to the right, pain with extension and flexion of the lumbar spine.  Lumbar spine exam reveals no  evidence of soft tissue swelling, deformity or skin ecchymosis. On palpation there is no tenderness of the lumbar spine. No flank pain with percussion. The abdomen is soft and nontender. Nontender over the trochanters. No cellulitis or lymphadenopathy.  Motor is 5/5 including EHL, tibialis anterior, plantar flexion, quadriceps and hamstrings. Patient is normoreflexic. There is no Babinski or clonus. Sensory exam is intact to light touch. Patient has good distal pulses. No DVT. No pain and normal range of motion without instability of the hips, knees and ankles.  Neurological: She is alert and oriented to person, place, and time.    Three-view radiographs of the lumbar spine demonstrates multilevel degeneration at L5-S1, L3-L4, L4-L5.  These are outside films that we have reviewed.  Radiographs with flexion and extension radiographs are obtained here. We see no instability on flexion, extension, but disc space narrowing at 4-5 and at 5-1.  The MRI demonstrates facet arthrosis at multiple levels. There is a large synovial cyst from the L4-5 facet extending into the foramen, compressing the L4 root.  Assessment/Plan 1. Recurrent L4 radiculopathy, myotomal weakness, dermatomal dysesthesias despite rest, activity modification, therapy and aspiration. 2. Elevated BMI. 3. Fibromyalgia. 4. Multilevel disc degeneration.  We had extensive discussion concerning current pathology, relevant anatomy and treatment option. At this point in time, she has options she can live with her symptoms, potentially reaspirate, although with return of her symptoms, it is most likely persistent, it is fairly large and extending in the 4 foramen and compression the 4 root with her quad weakness. We discussed lumbar decompression of 4-5 and excision of the cyst. I had an extensive discussion of the risks and benefits of the lumbar decompression with the patient including bleeding, infection, damage to neurovascular structures,  epidural fibrosis, CSF leak requiring repair. We also discussed increase in pain, adjacent segment disease, recurrent disc herniation, need for future surgery including repeat decompression and/or fusion. We also discussed risks of postoperative hematoma, paralysis, anesthetic complications including DVT, PE, death, cardiopulmonary dysfunction. In addition, the perioperative and postoperative courses were discussed in detail including the rehabilitative time and return to functional activity and work. I provided the patient with an illustrated handout and utilized the appropriate surgical models. She would like to proceed with that. We did though discuss possibility of this  even with lumbar decompression, recurrence of her cyst. She has multilevel disc degeneration. This would not undress back pain. She understands that. She has a history of MRSA and would require preoperative vancomycin. I recommend preoperative clearance. If she has any changes in the interim, she is to call, use a cane, railing up and down stairs and continue with her exercise program as best possible to strengthen her quad.  Plan microlumbar decompression L4-5, removal of synovial cyst  Cecilie Kicks., PA-C for Dr. Tonita Cong 05/29/2016, 8:58 AM

## 2016-06-21 NOTE — Anesthesia Postprocedure Evaluation (Signed)
Anesthesia Post Note  Patient: [name]  Procedure(s) Performed: Procedure(s) (LRB): MICRO LUMBAR DECOMPRESSION L4-L5 AND REMOVAL OF SYNOVIAL CYST    1 LEVEL (N/A)  Patient location during evaluation: PACU Anesthesia Type: General Level of consciousness: awake and alert and patient cooperative Pain management: pain level controlled Vital Signs Assessment: post-procedure vital signs reviewed and stable Respiratory status: spontaneous breathing and respiratory function stable Cardiovascular status: stable Anesthetic complications: no    Last Vitals:  Vitals:   06/21/16 1605 06/21/16 1705  BP: (!) 124/38 112/60  Pulse: 64 63  Resp: 13 16  Temp: 36.6 C 36.4 C    Last Pain:  Vitals:   06/21/16 1705  TempSrc: Oral  PainSc:                  [place] S

## 2016-06-21 NOTE — Interval H&P Note (Signed)
History and Physical Interval Note:  06/21/2016 8:33 AM  [name]  has presented today for surgery, with the diagnosis of HNP L4-L5 SYNOVIAL CYST   The various methods of treatment have been discussed with the patient and family. After consideration of risks, benefits and other options for treatment, the patient has consented to  Procedure(s): MICRO LUMBAR DECOMPRESSION L4-L5 AND REMOVAL OF SYNOVIAL CYST    1 LEVEL (N/A) as a surgical intervention .  The patient's history has been reviewed, patient examined, no change in status, stable for surgery.  I have reviewed the patient's chart and labs.  Questions were answered to the patient's satisfaction.     Merelyn Klump C

## 2016-06-21 NOTE — Discharge Instructions (Signed)

## 2016-06-22 DIAGNOSIS — M7138 Other bursal cyst, other site: Secondary | ICD-10-CM | POA: Diagnosis not present

## 2016-06-22 LAB — BASIC METABOLIC PANEL
Anion gap: 7 (ref 5–15)
BUN: 12 mg/dL (ref 6–20)
CHLORIDE: 102 mmol/L (ref 101–111)
CO2: 27 mmol/L (ref 22–32)
CREATININE: 0.85 mg/dL (ref 0.44–1.00)
Calcium: 8.2 mg/dL — ABNORMAL LOW (ref 8.9–10.3)
GFR calc non Af Amer: >60 mL/min (ref >60)
Glucose, Bld: 138 mg/dL — ABNORMAL HIGH (ref 65–99)
POTASSIUM: 4.2 mmol/L (ref 3.5–5.1)
Sodium: 136 mmol/L (ref 135–145)

## 2016-06-22 MED ORDER — CITALOPRAM HYDROBROMIDE 40 MG PO TABS: 20.0000 mg | ORAL_TABLET | Freq: Every day | ORAL | 0 refills | Status: DC

## 2016-06-22 MED ORDER — CITALOPRAM HYDROBROMIDE 20 MG PO TABS
20.0000 mg | ORAL_TABLET | Freq: Every day | ORAL | Status: DC
  Administered 2016-06-22: 20 mg via ORAL
  Filled 2016-06-22: qty 1

## 2016-06-22 MED ORDER — ASPIRIN EC 81 MG PO TBEC: 81.0000 mg | DELAYED_RELEASE_TABLET | Freq: Every day | ORAL | Status: DC

## 2016-06-22 NOTE — Brief Op Note (Signed)
06/21/2016  9:05 AM  PATIENT:  [name]  65 y.o. female  PRE-OPERATIVE DIAGNOSIS:  HNP L4-L5 SYNOVIAL CYST   POST-OPERATIVE DIAGNOSIS:  HNP L4-L5 SYNOVIAL CYST   PROCEDURE:  Procedure(s): MICRO LUMBAR DECOMPRESSION L4-L5 AND REMOVAL OF SYNOVIAL CYST    1 LEVEL (N/A)  SURGEON:  Surgeon(s) and Role:    * Susa Day, MD - Primary  PHYSICIAN ASSISTANT:   ASSISTANTS: [place]   ANESTHESIA:   general  EBL:  Total I/O In: 240 [P.O.:240] Out: -   BLOOD ADMINISTERED:none  DRAINS: none   LOCAL MEDICATIONS USED:  MARCAINE     SPECIMEN:  Source of Specimen:  L45 disc  DISPOSITION OF SPECIMEN:  PATHOLOGY  COUNTS:  YES  TOURNIQUET:  * No tourniquets in log *  DICTATION: .Other Dictation: Dictation Number [id number]  PLAN OF CARE: Admit for overnight observation  PATIENT DISPOSITION:  PACU - hemodynamically stable.   Delay start of Pharmacological VTE agent (>24hrs) due to surgical blood loss or risk of bleeding: yes

## 2016-06-22 NOTE — Evaluation (Signed)
Physical Therapy Evaluation Patient Details Name: [name] MRN: [id number] DOB: 03/03/1951 Today's Date: 06/22/2016   History of Present Illness  this 65 year old female is s/p L4-5 decompression and removal of synovial cyst.  PMH of fibromyalgia, COPD and obesity, chronic pain  Clinical Impression  Pt admitted with above diagnosis. Pt currently with functional limitations due to the deficits listed below (see PT Problem List). * Pt will benefit from skilled PT to increase their independence and safety with mobility to allow discharge to the venue listed below.  Pt requires encouragement to participate but is agreeable to PT; she does minimal activity at home; will continue to follow, recommend HHPT     Follow Up Recommendations Home health PT;Supervision for mobility/OOB    Equipment Recommendations  Rolling walker with 5" wheels;3in1 (PT)    Recommendations for Other Services       Precautions / Restrictions Precautions Precautions: None Restrictions Weight Bearing Restrictions: No      Mobility  Bed Mobility Overal bed mobility: Needs Assistance Bed Mobility: Sit to Sidelying Rolling: Min assist     Sit to supine: Min assist Sit to sidelying: Min guard General bed mobility comments: cues for back precautions and technique, light assist with LEs  Transfers Overall transfer level: Needs assistance Equipment used: Rolling walker (2 wheeled) Transfers: Sit to/from Stand Sit to Stand: Min assist;Min guard Stand pivot transfers: Min assist       General transfer comment: assist to rise and stabilize. Cues for back precautions  Ambulation/Gait Ambulation/Gait assistance: Min guard Ambulation Distance (Feet): 15 Feet (11') Assistive device: Rolling walker (2 wheeled) Gait Pattern/deviations: Step-through pattern;Trunk flexed Gait velocity: decr Gait velocity interpretation: Below normal speed for age/gender General Gait Details: cues for posture, step  length  Stairs            Wheelchair Mobility    Modified Rankin (Stroke Patients Only)       Balance Overall balance assessment: Needs assistance   Sitting balance-Leahy Scale: Fair       Standing balance-Leahy Scale: Fair                               Pertinent Vitals/Pain Pain Assessment: 0-10 Pain Score: 5  Faces Pain Scale: Hurts even more Pain Location: back Pain Descriptors / Indicators: Aching;Grimacing;Guarding Pain Intervention(s): Limited activity within patient's tolerance;Monitored during session;Premedicated before session    Home Living Family/patient expects to be discharged to:: Private residence Living Arrangements: Children Available Help at Discharge: Family Type of Home: House Home Access: Stairs to enter   Technical brewer of Steps: 1 Home Layout: Two level Home Equipment: None Additional Comments: 16 steps to enter daughter's home.  Has 3;1 from when daughter's husband was alive    Prior Function Level of Independence: Independent         Comments: bed to chair transfers mostly,  amb short distances in house; able to go up/down steps at dtr's house 1-2 x/day--reports minimal activity at her baseline     Hand Dominance        Extremity/Trunk Assessment   Upper Extremity Assessment: Defer to OT evaluation;Overall WFL for tasks assessed           Lower Extremity Assessment: Overall WFL for tasks assessed         Communication   Communication: No difficulties  Cognition Arousal/Alertness: Awake/alert Behavior During Therapy: WFL for tasks assessed/performed Overall Cognitive Status: No family/caregiver present  to determine baseline cognitive functioning                      General Comments      Exercises        Assessment/Plan    PT Assessment Patient needs continued PT services  PT Diagnosis Difficulty walking   PT Problem List Decreased activity tolerance;Decreased  mobility;Decreased knowledge of use of DME;Decreased knowledge of precautions  PT Treatment Interventions DME instruction;Gait training;Functional mobility training;Stair training;Therapeutic activities;Therapeutic exercise;Patient/family education   PT Goals (Current goals can be found in the Care Plan section) Acute Rehab PT Goals Patient Stated Goal: less pain PT Goal Formulation: With patient Time For Goal Achievement: 06/29/16 Potential to Achieve Goals: Good    Frequency 7X/week   Barriers to discharge        Co-evaluation               End of Session Equipment Utilized During Treatment: Gait belt Activity Tolerance: Patient limited by fatigue;Patient limited by pain Patient left: in bed;with call bell/phone within reach;with bed alarm set           Time: [id number] PT Time Calculation (min) (ACUTE ONLY): 33 min   Charges:   PT Evaluation $PT Eval Low Complexity: 1 Procedure PT Treatments $Gait Training: 8-22 mins   PT G Codes:        Heddy Vidana 07/07/2016, 12:15 PM

## 2016-06-22 NOTE — Care Management Note (Signed)
Case Management Note  Patient Details  Name: [name] MRN: [id number] Date of Birth: Sep 10, 1951  Subjective/Objective:                  L4-5 decompression and removal of synovial cyst Action/Plan:  Expected Discharge Date:                  Expected Discharge Plan:  [place]  In-House Referral:     Discharge planning Services  CM Consult  Post Acute Care Choice:  Home Health Choice offered to:  Patient  DME Arranged:  [facility] wide DME Agency:  [place] Arranged:  PT [facility] Agency:  [facility]  Status of Service:  Completed, signed off  If discussed at [place] of Stay Meetings, dates discussed:    Additional Comments: CM met with pt in room to offer choice of home health agency.  Pt chooses [facility] to render HHPT.  Referral called to [facility] DME rep, Santiago Glad.  CM called [facility] DME rep, Jermaine to please deliver the wide rolling walker to room.  Pt states she has a 3n1 at home.  No other CM needs were communicated. Dellie Catholic, RN 06/22/2016, 12:16 PM

## 2016-06-22 NOTE — Progress Notes (Signed)
Subjective: 1 Day Post-Op Procedure(s) (LRB): MICRO LUMBAR DECOMPRESSION L4-L5 AND REMOVAL OF SYNOVIAL CYST    1 LEVEL (N/A) Patient reports pain as moderate.   Reports back pain. Leg pain improved. Up on commode this morning during our rounds. Does not feel ready to go home. Discouraged she cannot be placed in a SNF. She recently moved in with her daughter to help after her son-in-law's passing and they have stairs in their home. She does not feel ready to leave today. Has not ambulated yet with PT.  Objective: Vital signs in last 24 hours: Temp:  [97.4 F (36.3 C)-98.6 F (37 C)] 98.6 F (37 C) (08/17 0545) Pulse Rate:  [56-75] 66 (08/17 0545) Resp:  [12-16] 16 (08/17 0545) BP: (103-153)/(38-88) 122/73 (08/17 0545) SpO2:  [96 %-100 %] 100 % (08/17 0545) Weight:  [118.8 kg (262 lb)] 118.8 kg (262 lb) (08/16 1500)  Intake/Output from previous day: 08/16 0701 - 08/17 0700 In: 4400 [P.O.:1800; I.V.:2045; IV Piggyback:555] Out: [id number]; Blood:50] Intake/Output this shift: Total I/O In: 240 [P.O.:240] Out: -   No results for input(s): HGB in the last 72 hours. No results for input(s): WBC, RBC, HCT, PLT in the last 72 hours.  Recent Labs  06/22/16 0436  NA 136  K 4.2  CL 102  CO2 27  BUN 12  CREATININE 0.85  GLUCOSE 138*  CALCIUM 8.2*   No results for input(s): LABPT, INR in the last 72 hours.  Neurologically intact ABD soft Neurovascular intact Sensation intact distally Intact pulses distally Dorsiflexion/Plantar flexion intact Incision: dressing C/D/I and no drainage No cellulitis present Compartment soft no sign of DVT  Assessment/Plan: 1 Day Post-Op Procedure(s) (LRB): MICRO LUMBAR DECOMPRESSION L4-L5 AND REMOVAL OF SYNOVIAL CYST    1 LEVEL (N/A) Advance diet Up with therapy D/C IV fluids  Plan D/C tomorrow Set up home health, any DME that may be required  Chigozie Basaldua M. 06/22/2016, 9:41 AM

## 2016-06-22 NOTE — Evaluation (Signed)
Occupational Therapy Evaluation Patient Details Name: [name] MRN: [id number] DOB: August 07, 1951 Today's Date: 06/22/2016    History of Present Illness this 65 year old female is s/p L4-5 decompression and removal of synovial cyst.  PMH of fibromyalgia, COPD and obesity   Clinical Impression   Pt was admitted for the above sx. She was limited by pain this session. Will benefit from continued OT in acute setting.  She currently needs up to total A for hygiene. Goals in acute are for min guard overall with daughter continuing to support her with adls at home, as she did prior to sx.    Follow Up Recommendations  SNF;Supervision/Assistance - 24 hour    Equipment Recommendations   (pt believes she has access to 3:1)    Recommendations for Other Services       Precautions / Restrictions Precautions Precautions: Back Restrictions Weight Bearing Restrictions: No      Mobility Bed Mobility Overal bed mobility: Needs Assistance Bed Mobility: Rolling;Sit to Supine Rolling: Min assist     Sit to supine: Min assist   General bed mobility comments: utilized pad for rolling.  Pt was close to bed; needed more assistance to push up.  cues for back precautions  Transfers Overall transfer level: Needs assistance Equipment used: Rolling walker (2 wheeled) Transfers: Sit to/from [facility] Sit to Stand: Min assist Stand pivot transfers: Min assist       General transfer comment: assist to rise and stabilize. Cues for back precautions    Balance                                            ADL Overall ADL's : Needs assistance/impaired     Grooming: Set up;Sitting   Upper Body Bathing: Supervision/ safety;Sitting   Lower Body Bathing: Maximal assistance;Sit to/from stand   Upper Body Dressing : Supervision/safety;Sitting   Lower Body Dressing: Total assistance;Sit to/from stand   Toilet Transfer: Minimal  assistance;Stand-pivot;BSC;RW   Toileting- Clothing Manipulation and Hygiene: Total assistance;+2 for physical assistance;Sit to/from stand         General ADL Comments: educated on back precautions and adls. Handout given.  Educated on toilet aide only; daughter has been assisting with adls     Vision     Perception     Praxis      Pertinent Vitals/Pain Pain Assessment: Faces Faces Pain Scale: Hurts even more Pain Location: back Pain Descriptors / Indicators: Aching;Grimacing;Moaning Pain Intervention(s): Limited activity within patient's tolerance;Monitored during session;Premedicated before session;Repositioned     Hand Dominance     Extremity/Trunk Assessment Upper Extremity Assessment Upper Extremity Assessment: Overall WFL for tasks assessed           Communication Communication Communication: No difficulties   Cognition Arousal/Alertness: Awake/alert Behavior During Therapy: WFL for tasks assessed/performed Overall Cognitive Status: Within Functional Limits for tasks assessed                     General Comments       Exercises       Shoulder Instructions      Home Living Family/patient expects to be discharged to:: Private residence Living Arrangements: Children Available Help at Discharge: Family               Bathroom Shower/Tub: Walk-in shower;Tub/shower unit (she can use daughter's walk in shower)  Bathroom Toilet: Standard     Home Equipment: Bedside commode   Additional Comments: 16 steps to enter daughter's home.  Has 3;1 from when daughter's husband was alive      Prior Functioning/Environment Level of Independence: Independent             OT Diagnosis: Acute pain   OT Problem List: Decreased strength;Decreased activity tolerance;Decreased knowledge of use of DME or AE;Pain   OT Treatment/Interventions: Self-care/ADL training;DME and/or AE instruction;Patient/family education;Therapeutic activities    OT  Goals(Current goals can be found in the care plan section) Acute Rehab OT Goals Patient Stated Goal: less pain OT Goal Formulation: With patient Time For Goal Achievement: 06/29/16 Potential to Achieve Goals: Good ADL Goals Pt Will Perform Grooming: standing;with min guard assist Pt Will Transfer to Toilet: with min guard assist;ambulating;bedside commode Pt Will Perform Tub/Shower Transfer: Shower transfer;ambulating;with min guard assist Additional ADL Goal #1: Pt will follow back precautions during ADLs/transfers with no more than one cue for back precautions per task  OT Frequency: Min 2X/week   Barriers to D/C:            Co-evaluation              End of Session    Activity Tolerance: Patient limited by pain Patient left: in chair;with call bell/phone within reach;with chair alarm set;with family/visitor present   Time: [id number] OT Time Calculation (min): 28 min Charges:  OT General Charges $OT Visit: 1 Procedure OT Evaluation $OT Eval Low Complexity: 1 Procedure OT Treatments $Self Care/Home Management : 8-22 mins G-Codes: OT G-codes **NOT FOR INPATIENT CLASS** Functional Assessment Tool Used: clinical observation Functional Limitation: Self care Self Care Current Status [id number]): At least 80 percent but less than 100 percent impaired, limited or restricted Self Care Goal Status [id number]): At least 40 percent but less than 60 percent impaired, limited or restricted  [facility] 06/22/2016, 10:22 AM Lesle Chris, OTR/L [phone] 06/22/2016

## 2016-06-22 NOTE — Op Note (Signed)
[name], [name]                ACCOUNT NO.:  [id number]  MEDICAL RECORD NO.:  [id number]  LOCATION:  35                         FACILITY:  [facility]  PHYSICIAN:  Susa Day, M.D.    DATE OF BIRTH:  1950/12/27  DATE OF PROCEDURE:  06/21/2016 DATE OF DISCHARGE:                              OPERATIVE REPORT   PREOPERATIVE DIAGNOSIS:  Spinal stenosis, synovial cyst, L4-5, left. Elevated BMI of 43.  POSTOPERATIVE DIAGNOSES: 1. Spinal stenosis. 2. Synovial cyst. 3. Extruded fragment, L4-5. 4. Elevated BMI of 43.  PROCEDURES PERFORMED: 1. Microlumbar decompression at L4-5, left. 2. Foraminotomies at L4-L5, left. 3. Microdiskectomy, 4-5, left. 4. Excision of synovial cyst, 4-5, left.  ANESTHESIA:  General.  ASSISTANT:  Cleophas Dunker, PA.  Technical difficulty increased due to the patient's significantly elevated BMI.  HISTORY:  A 64, left lower extremity radicular pain, L4 nerve root distribution, myotomal weakness, dermatomal dysesthesias secondary to decompress the L4 nerve root secondary to a presumed synovial cyst from 4-5 facet, indicated for decompression, failing conservative treatment. She had newly increased her pain from the last office visit secondary to increase her activity after increasing her analgesics with severe L4 radicular pain.  Preoperatively, she has myotomal weakness, dermatomal dysesthesias and L4 nerve root distribution, neural tension signs.  She was indicated for decompression by excision of synovial cyst and decompression of the 5 and 4 roots.  Risk and benefits were discussed including bleeding, infection, damage to neurovascular structures, DVT, PE, anesthetic complications, no change in symptoms, worsening symptoms, etc.  TECHNIQUE:  With the patient in supine position, after induction of adequate anesthesia and 1.5 vancomycin, placed prone on the Andrews frame, which required additional time and positioning due to her elevated BMI,  Foley to gravity, lumbar region, prepped and draped in usual sterile fashion.  Two 18-gauge spinal needles were utilized to localize 4-5 interspace, confirmed with x-ray.  Incision was made from spinous process, 4-5.  There was a significant subcutaneous adipose level.  We used McCullough retractor to the dorsal lumbar fascia.  It was infiltrated with 0.25% Marcaine with epinephrine.  The dorsolumbar fascia was identified, divided along the skin incision.  Paraspinous muscle was elevated from lamina of 4-5, deep.  Extra long retractors were placed.  Operating microscope was draped and brought on the surgical field.  We identified the 4-5 space.  She had significant facet hypertrophy, used a Leksell rongeur to partially remove the lamina of 4. We detached the ligamentum flavum from the cephalad edge of 5.  We continued with hemilaminotomy at the caudad edge of 4, decompressed, continuing cephalad, the near hemilaminectomy of 4.  I performed generous foraminotomy of L5, identified the 5 root gently mobilized it medially.  Prior to that, we used an osteotome to remove part of the hypertrophied facet at the inferior process.  Identified the superior process.  We used microcurette to detach the ligamentum flavum from the superior process and with 2-mm Kerrison decompressed the lateral recess to the medial border of the pedicle.  We then continued gently mobilized the 5 root medially, we identified a large mass emanating both from the facet as well as currently from the disk space and  the sub-posterior longitudinal ligament space.  It was compressing the 4 root cephalad against the foramen of 4.  The portion that was elevated posterior longitudinal ligament was carefully delineated from the thecal sac and the L5 root and with a blunt Penfield in the posterior aspect just above the disk space.  We opened the area of the mass off the elevated posterior longitudinal ligament.  Prior to, an 18-gauge  needle was placed into that with no leakage of fluid, but material consistent with disk.  After opening the posterior longitudinal ligament and digitally manipulating the mass, we expressed couple of large fragments consistent with herniated material off the superior part of the facet, some hypertrophic synovium consistent with a synovial cyst.  However, the majority of this mass appeared to be an old extruded fragment that was partially liquified.  This mass was compressing the 4 root up against the pedicle and we were able to get above the 4 root with hemilaminectomy and generous foraminotomy of 4 protecting the 4 root, it was fairly adhered to the undersurface of the 4 root and compressing it after decompressing this mass and removing the portion that was not adhered to the nerve root, the neural probe passed freely at the foramen of 4 and of 5.  Good restoration of the thecal sac and the 4 root looked compressed, erythematous and edematous.  No CSF leakage by inspection or after a Valsalva maneuver and we filed the 4 root out the 4 foramen.  No residual disk herniation were able to retrieve at this point in time. There was adherent material to the 4 root, which we left.  We did not appear to be compressing that.  Again, checked beneath the thecal sac, there was epidural venous plexus, which was lysed.  We checked the axilla of the foramen of 4 and 5, no residual material.  Copiously irrigated the wound.  No active bleeding or CSF leakage.  Placed a thrombin-soaked Gelfoam in the laminotomy defect.  We removed the [facility] retractor, copiously irrigated the wound.  We closed the dorsolumbar fascia with 1 Vicryl, subcu with multiple 2-0 and skin with staples.  Again, multiple layers were required.  Staples were used to the patient's elevated BMI.  Sterile dressing applied.  Placed supine on the hospital bed, extubated without difficulty, and transported to the recovery room in  satisfactory condition.  The patient tolerated the procedure well.  No complications.  Specimen was sent to Pathology.  Again, the technical difficulty due to the increase of the patient's elevated BMI of 43.     Susa Day, M.D.     Geralynn Rile  D:  06/22/2016  T:  06/22/2016  Job:  [id number]

## 2016-06-22 NOTE — Progress Notes (Signed)
CSW met with pt to assist with d/c planning. Pt requesting to go to SNF at d/c. PT eval is pending. Pt reports that she has [employer]. This policy does not have a SNF benefit. Pt also reports that she has traditional [employer]. Pt hospital status is " out pt in bed". Medicare will not cover cost of SNF due to this status. Pt reports that she was told that if she stayed in the hospital one overnight her insurance would cover cost of SNF placement. Unfortunately, pt was provided with incorrect info or pt misunderstood info provided. CSW offered to assist with pvt pay SNF placement but pt reports that she cannot afford pvt pay placement.  RNCM will assist with [facility].  Roselyn Reef Gryphon Vanderveen LCSW [phone]

## 2016-06-23 DIAGNOSIS — M7138 Other bursal cyst, other site: Secondary | ICD-10-CM | POA: Diagnosis not present

## 2016-06-23 MED ORDER — HYDROCODONE-ACETAMINOPHEN 10-325 MG PO TABS: 1.0000 | ORAL_TABLET | ORAL | 0 refills | Status: DC | PRN

## 2016-06-23 MED ORDER — HYDROCHLOROTHIAZIDE 25 MG PO TABS: 25.0000 mg | ORAL_TABLET | Freq: Every day | ORAL | 99 refills | Status: DC

## 2016-06-23 MED ORDER — HYDROCODONE-ACETAMINOPHEN 5-325 MG PO TABS: 1.0000 | ORAL_TABLET | ORAL | 0 refills | Status: DC | PRN

## 2016-06-23 NOTE — Progress Notes (Signed)
   06/23/16 0900  OT Visit Information  Last OT Received On 06/23/16  Assistance Needed +1  History of Present Illness this 65 year old female is s/p L4-5 decompression and removal of synovial cyst.  PMH of fibromyalgia, COPD and obesity, chronic pain  Precautions  Precautions None  Pain Assessment  Pain Assessment 0-10  Pain Score 6  Pain Location back  Pain Descriptors / Indicators Aching  Pain Intervention(s) Limited activity within patient's tolerance;Monitored during session;Premedicated before session;Repositioned;Ice applied  Cognition  Arousal/Alertness Awake/alert  Behavior During Therapy WFL for tasks assessed/performed  Overall Cognitive Status No family/caregiver present to determine baseline cognitive functioning  ADL  General ADL Comments returned to educate pt on tub readiness and technique to step in when ready.  Pt sleepy and requesting back to bed.  Pt verbalizes understanding.  Pt following back precautions with bed mobility and sit to stand without cues.  Bed Mobility  Sit to sidelying Min assist  General bed mobility comments assist for RLE when returning to sidelying on L  Restrictions  Weight Bearing Restrictions No  Transfers  Transfers Sit to/from Stand  Sit to Stand Supervision  General transfer comment cues for hand placement; pt kept back straight when standing up  OT - End of Session  Activity Tolerance Patient tolerated treatment well  Patient left in bed;with call bell/phone within reach;with bed alarm set  OT Assessment/Plan  Follow Up Recommendations Supervision/Assistance - 24 hour  OT Equipment 3 in 1 bedside comode (wide, if pt qualifies:  standard is a tight fit)  OT Goal Progression  Progress towards OT goals Progressing toward goals  OT Time Calculation  OT Start Time (ACUTE ONLY) 0835  OT Stop Time (ACUTE ONLY) 0849  OT Time Calculation (min) 14 min  OT G-codes **NOT FOR INPATIENT CLASS**  Functional Assessment Tool Used clinical  observation  Self Care Discharge Status [id number]) CL  OT General Charges  $OT Visit 1 Procedure  OT Treatments  $Therapeutic Activity 8-22 mins  Lesle Chris, OTR/L [phone] 06/23/2016

## 2016-06-23 NOTE — Progress Notes (Signed)
Occupational Therapy Treatment Patient Details Name: [name] MRN: [id number] DOB: [birth year] Today's Date: 06/23/2016    History of present illness this 66 year old female is s/p L4-5 decompression and removal of synovial cyst.  PMH of fibromyalgia, COPD and obesity, chronic pain   OT comments  Pt moving better; now plans youngest daughter's home  Follow Up Recommendations  Supervision/Assistance - 24 hour    Equipment Recommendations   (wide 3:1 commode)    Recommendations for Other Services      Precautions / Restrictions Precautions Precautions: None Restrictions Weight Bearing Restrictions: No       Mobility Bed Mobility     Rolling: Min guard     Sit to supine: Min assist   General bed mobility comments: assist for trunk.    Transfers   Equipment used: Rolling walker (2 wheeled) Transfers: Sit to/from Stand Sit to Stand: [employer] transfer comment: for safety; cues for back precautions    Balance                                   ADL       Grooming: Oral care;Supervision/safety;Standing                   Toilet Transfer: Min guard;Ambulation;BSC;RW   Toileting- Clothing Manipulation and Hygiene: Sit to/from stand;Moderate assistance (set up/supervision for front sitting  overall mod A)         General ADL Comments: pt is now planning her younger daughter's house:  fewer stairs to get in and now will have a tub shower. Max A for LB dressing--she will have family help; not interested in AE      Vision                     Perception     Praxis      Cognition   Behavior During Therapy: WFL for tasks assessed/performed Overall Cognitive Status: No family/caregiver present to determine baseline cognitive functioning                       Extremity/Trunk Assessment               Exercises     Shoulder Instructions       General Comments      Pertinent Vitals/  Pain       Pain Assessment: 0-10 Pain Score: 8  Pain Location: back Pain Descriptors / Indicators: Aching;Grimacing Pain Intervention(s): Limited activity within patient's tolerance;Monitored during session;Premedicated before session;Repositioned  Home Living                                          Prior Functioning/Environment              Frequency       Progress Toward Goals  OT Goals(current goals can now be found in the care plan section)  Progress towards OT goals: Progressing toward goals  Acute Rehab OT Goals Patient Stated Goal: less pain  Plan      Co-evaluation                 End of Session     Activity Tolerance Patient tolerated treatment well   Patient  Left in chair;with call bell/phone within reach;with chair alarm set;with family/visitor present   Nurse Communication         Time: [id number] OT Time Calculation (min): 19 min  Charges: OT G-codes **NOT FOR INPATIENT CLASS** Self Care Discharge Status [phone]): At least 60 percent but less than 80 percent impaired, limited or restricted OT General Charges $OT Visit: 1 Procedure OT Treatments $Self Care/Home Management : 8-22 mins  Kavontae Pritchard 06/23/2016, 9:24 AM Lesle Chris, OTR/L [phone] 06/23/2016

## 2016-06-23 NOTE — Progress Notes (Signed)
Physical Therapy Treatment Patient Details Name: [name] MRN: [id number] DOB: January 01, 1951 Today's Date: 07-21-2016    History of Present Illness this 65 year old female is s/p L4-5 decompression and removal of synovial cyst.  PMH of fibromyalgia, COPD and obesity, chronic pain    PT Comments    Pt will benefit from HHPT   Follow Up Recommendations  Home health PT;Supervision for mobility/OOB     Equipment Recommendations  Rolling walker with 5" wheels;3in1 (PT)    Recommendations for Other Services       Precautions / Restrictions Precautions Precautions: None Restrictions Weight Bearing Restrictions: No    Mobility  Bed Mobility Overal bed mobility: Needs Assistance Bed Mobility: Rolling;Sidelying to Sit;Sit to Sidelying Rolling: Supervision Sidelying to sit: Supervision   Sit to supine: Min assist Sit to sidelying: Supervision General bed mobility comments: incr time; pt demo's maintaining back precautions  Transfers Overall transfer level: Needs assistance Equipment used: Rolling walker (2 wheeled) Transfers: Sit to/from Stand Sit to Stand: Supervision         General transfer comment: initial cues for back precautions  Ambulation/Gait Ambulation/Gait assistance: Supervision;Modified independent (Device/Increase time) Ambulation Distance (Feet): 40 Feet Assistive device: Rolling walker (2 wheeled) Gait Pattern/deviations: Step-through pattern     General Gait Details: cues for posture, step length   Stairs Stairs: Yes Stairs assistance: Min guard Stair Management: One rail Right;One rail Left;Step to pattern;Forwards Number of Stairs: 5 (x2) General stair comments: cues for sequence and back precautions  Wheelchair Mobility    Modified Rankin (Stroke Patients Only)       Balance                                    Cognition Arousal/Alertness: Awake/alert Behavior During Therapy: WFL for tasks  assessed/performed Overall Cognitive Status: Within Functional Limits for tasks assessed                      Exercises      General Comments        Pertinent Vitals/Pain Pain Assessment: 0-10 Pain Score: 4  Pain Location: back Pain Descriptors / Indicators: Aching Pain Intervention(s): Limited activity within patient's tolerance;Monitored during session    Home Living                      Prior Function            PT Goals (current goals can now be found in the care plan section) Acute Rehab PT Goals Patient Stated Goal: less pain PT Goal Formulation: With patient Time For Goal Achievement: 06/29/16 Potential to Achieve Goals: Good Progress towards PT goals: Progressing toward goals    Frequency  7X/week    PT Plan Current plan remains appropriate    Co-evaluation             End of Session Equipment Utilized During Treatment: Gait belt Activity Tolerance: Patient tolerated treatment well Patient left: in bed;with call bell/phone within reach     Time: 1000-1023 PT Time Calculation (min) (ACUTE ONLY): 23 min  Charges:  $Gait Training: 23-37 mins                    G Codes:      [name] 07-21-2016, 10:33 AM

## 2016-06-23 NOTE — Progress Notes (Signed)
Per nursing and PT eval, pt in need of a wide 3in1. Order received and [facility] DME contacted for 3in1. No other CM needs communicated. [phone]

## 2016-06-23 NOTE — Progress Notes (Signed)
Subjective: 2 Days Post-Op Procedure(s) (LRB): MICRO LUMBAR DECOMPRESSION L4-L5 AND REMOVAL OF SYNOVIAL CYST    1 LEVEL (N/A) Patient reports pain as moderate.  Pain improved since yesterday. Leg pain much improved, weakness feels better as well. She feels able to go home today.  Objective: Vital signs in last 24 hours: Temp:  [98.2 F (36.8 C)-98.8 F (37.1 C)] 98.6 F (37 C) (08/18 0532) Pulse Rate:  [64-73] 73 (08/18 0532) Resp:  [16] 16 (08/18 0532) BP: (105-119)/(50-66) 116/66 (08/18 0532) SpO2:  [91 %-100 %] 93 % (08/18 0532)  Intake/Output from previous day: 08/17 0701 - 08/18 0700 In: 1723.3 [P.O.:1320; I.V.:403.3] Out: 4650 [Urine:4650] Intake/Output this shift: No intake/output data recorded.  No results for input(s): HGB in the last 72 hours. No results for input(s): WBC, RBC, HCT, PLT in the last 72 hours.  Recent Labs  06/22/16 0436  NA 136  K 4.2  CL 102  CO2 27  BUN 12  CREATININE 0.85  GLUCOSE 138*  CALCIUM 8.2*   No results for input(s): LABPT, INR in the last 72 hours.  Neurologically intact ABD soft Neurovascular intact Sensation intact distally Intact pulses distally Dorsiflexion/Plantar flexion intact Incision: dressing C/D/I and no drainage No cellulitis present Compartment soft no calf pain or sign of DVT  Assessment/Plan: 2 Days Post-Op Procedure(s) (LRB): MICRO LUMBAR DECOMPRESSION L4-L5 AND REMOVAL OF SYNOVIAL CYST    1 LEVEL (N/A) Advance diet Up with therapy D/C IV fluids  Discussed D/C instructions, Lspine precautions D/C home today Continue pre-op pain mgmt meds: Morphine and Norco Discussed with Dr. Mliss Fritz, JACLYN M. 06/23/2016, 8:07 AM

## 2016-06-23 NOTE — Discharge Summary (Signed)
Physician Discharge Summary   Patient ID: [name] MRN: [id number] DOB/AGE: 66-Oct-1952 66 y.o.  Admit date: 06/21/2016 Discharge date: 06/23/2016  Primary Diagnosis:   HNP L4-L5 SYNOVIAL CYST   Admission Diagnoses:  Past Medical History:  Diagnosis Date  . Adrenal nodule ([place]) 04/08/2011   04/08/11 Abdominal CT showed Left adrenal nodule which is tachnically indeterminate but most likely an adenoma.  It is 1.7 cm and 42 HU on portal venous phase image. 31 HU on delayed images.   01/09/2012 Abdominal CT w/ & w/o CM: Stable benign left adrenal adenoma. No further specific follow-up is needed for this finding.    . Allergic rhinitis 01/03/2007   Qualifier: Diagnosis of  By: Drucie Ip    . Anemia    hx of   . Anxiety   . Carpal tunnel syndrome 02/14/2007   S/P carpel tunnel release bilaterally.    . Cataracts, bilateral   . Chronic pain syndrome 12/24/2008  . COPD WITHOUT EXACERBATION 01/03/2007   Qualifier: Diagnosis of  By: Drucie Ip    . DISC WITH RADICULOPATHY 01/03/2007   Qualifier: Diagnosis of  By: Drucie Ip    . Esophageal reflux 01/03/2007   Centricity Description: GASTROESOPHAGEAL REFLUX, NO ESOPHAGITIS Qualifier: Diagnosis of  By: Drucie Ip   Centricity Description: GERD Qualifier: Diagnosis of  By: Tye Savoy MD, Tommi Rumps    . Functional gastrointestinal disorder 06/18/2008   Functional Gastrointestinal Disorder with frequent eructation and bloating sensation with acute flares.  Responsive to Compazine and Bentyl.  Takes a few days to calm down.    Marland Kitchen HEMORRHOIDS, NOS 01/03/2007   Qualifier: Diagnosis of  By: Drucie Ip    . Hepatic steatosis 04/12/2011  . Herpes simplex labialis 05/11/2011  . Hip osteoarthritis 05/23/2007   MRI Pelvis (03/13/07) Bilateral osteoarthritis of the hips, left greater than right.  The  patient has a slightly more prominent hip effusion on the left than  the right.  No other significant abnormality.    . History of MRSA infection  04/28/2011  . HYPERCHOLESTEROLEMIA 12/24/2008  . Hypertension   . HYPERTENSION, BENIGN SYSTEMIC 01/03/2007   Qualifier: Diagnosis of  By: Drucie Ip    . HYPERTRIGLYCERIDEMIA 11/11/2007   Qualifier: Diagnosis of  By: McDiarmid MD, Sherren Mocha    . Hypothyroidism 02/15/2007   Qualifier: Diagnosis of  By: McDiarmid MD, Sherren Mocha    . HYPOTHYROIDISM, BORDERLINE 02/15/2007  . Irritable bowel syndrome 01/03/2007   Qualifier: Diagnosis of  By: Drucie Ip    . LACTOSE INTOLERANCE 03/10/2008  . Major depressive disorder, recurrent episode ([place]) 01/03/2007   Qualifier: Diagnosis of  By: Drucie Ip    . Obesity (BMI 30.0-34.9) 09/30/2007   Has lost 55 lbs since easter. Goal of <190 by 08/05/12.     . Obesity, Class III, BMI 40-49.9 (morbid obesity) ([place]) 09/30/2007       . Osteoarthritis of spine 01/03/2007   MR I Lumbar (08/03): GeneralDDD; L2-3 Large  HNP.  Mod pos  hnp - 12/14/2005, smhnpw/irritL4root - 12/14/2005    . OSTEOARTHRITIS, HANDS, BILATERAL 06/18/2008   Qualifier: Diagnosis of  By: McDiarmid MD, Sherren Mocha    . PATELLO FEMORAL STRESS SYNDROME 01/03/2007   Qualifier: Diagnosis of  By: Drucie Ip    . Periodic limb movements of sleep 12/05/2010   Diagnosed on Polysomnography testing Fall 2011.     . Pre-diabetes 12/25/2008   Qualifier: Diagnosis of  By: McDiarmid MD, Sherren Mocha    . RESTLESS LEGS SYNDROME 09/11/2007   Polysomnography (  10/2010, Dr Keturah Barre, interpreter. ) showed periodic limb movements that frequently awoke patient from sleep with arousal index of 4 per hour. Dr Danton Sewer (Sleep Specialist) thought her RLS was the major contributor to patient poor sleep condition, more than any sleep apnea.  He recommended considering opamine agonist (either requip or mirapex) to see if this will help.     . Rosacea, acne 10/24/2011  . SIGMOID POLYP 01/23/2008  . Solitary lung nodule 04/08/2011  . Stress incontinence   . Urticaria, chronic 05/22/2011  . VITAMIN D DEFICIENCY 03/03/2010   Discharge  Diagnoses:   Active Problems:   Spinal stenosis of lumbar region  Procedure:  Procedure(s) (LRB): MICRO LUMBAR DECOMPRESSION L4-L5 AND REMOVAL OF SYNOVIAL CYST    1 LEVEL (N/A)   Consults: none  HPI:  see H&P    Laboratory Data: Hospital Outpatient Visit on 06/14/2016  Component Date Value Ref Range Status  . Sodium 06/14/2016 133* 135 - 145 mmol/L Final  . Potassium 06/14/2016 4.1  3.5 - 5.1 mmol/L Final  . Chloride 06/14/2016 98* 101 - 111 mmol/L Final  . CO2 06/14/2016 26  22 - 32 mmol/L Final  . Glucose, Bld 06/14/2016 97  65 - 99 mg/dL Final  . BUN 06/14/2016 15  6 - 20 mg/dL Final  . Creatinine, Ser 06/14/2016 0.84  0.44 - 1.00 mg/dL Final  . Calcium 06/14/2016 8.8* 8.9 - 10.3 mg/dL Final  . GFR calc non Af Amer 06/14/2016 >60  >60 mL/min Final  . GFR calc Af Amer 06/14/2016 >60  >60 mL/min Final   Comment: (NOTE) The eGFR has been calculated using the CKD EPI equation. This calculation has not been validated in all clinical situations. eGFR's persistently <60 mL/min signify possible Chronic Kidney Disease.   . Anion gap 06/14/2016 9  5 - 15 Final  . WBC 06/14/2016 7.3  4.0 - 10.5 K/uL Final  . RBC 06/14/2016 4.68  3.87 - 5.11 MIL/uL Final  . Hemoglobin 06/14/2016 14.2  12.0 - 15.0 g/dL Final  . HCT 06/14/2016 41.5  36.0 - 46.0 % Final  . MCV 06/14/2016 88.7  78.0 - 100.0 fL Final  . MCH 06/14/2016 30.3  26.0 - 34.0 pg Final  . MCHC 06/14/2016 34.2  30.0 - 36.0 g/dL Final  . RDW 06/14/2016 12.4  11.5 - 15.5 % Final  . Platelets 06/14/2016 245  150 - 400 K/uL Final  . ABO/RH(D) 06/21/2016 O POS   Final  . Antibody Screen 06/21/2016 NEG   Final  . Sample Expiration 06/21/2016 06/24/2016   Final  . Extend sample reason 06/21/2016 NO TRANSFUSIONS OR PREGNANCY IN THE PAST 3 MONTHS   Final  . MRSA, PCR 06/14/2016 NEGATIVE  NEGATIVE Final  . Staphylococcus aureus 06/14/2016 NEGATIVE  NEGATIVE Final   Comment:        The Xpert SA Assay (FDA approved for NASAL  specimens in patients over 46 years of age), is one component of a comprehensive surveillance program.  Test performance has been validated by [facility] for patients greater than or equal to 58 year old. It is not intended to diagnose infection nor to guide or monitor treatment.   . ABO/RH(D) 06/14/2016 O POS   Final   No results for input(s): HGB in the last 72 hours. No results for input(s): WBC, RBC, HCT, PLT in the last 72 hours.  Recent Labs  06/22/16 0436  NA 136  K 4.2  CL 102  CO2 27  BUN 12  CREATININE 0.85  GLUCOSE 138*  CALCIUM 8.2*   No results for input(s): LABPT, INR in the last 72 hours.  X-Rays:Dg Lumbar Spine 2-3 Views  Result Date: 06/14/2016 CLINICAL DATA:  65 year old female with lumbar stenosis. Preop. Subsequent encounter. EXAM: LUMBAR SPINE - 2-3 VIEW COMPARISON:  02/26/2016. FINDINGS: Level assignment as per prior MR. Last fully open disc space labeled L5-S1. Moderate L2-3 and L3-4 disc space narrowing. Mild anterior wedging L1 vertebra. Partial calcification aorta. IMPRESSION: Level assignment as per prior MR. Last fully open disc space labeled L5-S1. Moderate L2-3 and L3-4 disc space narrowing. Mild anterior wedging L1 vertebra. Aortic atherosclerosis. Electronically Signed   By: Genia Del M.D.   On: 06/14/2016 15:49   Dg Bone Density  Result Date: 05/24/2016 EXAM: DUAL X-RAY ABSORPTIOMETRY (DXA) FOR BONE MINERAL DENSITY IMPRESSION: Referring Physician:  TODD D MCDIARMID PATIENT: Name: Amelianna, Meller Patient ID: [id number] Birth Date: 04/21/1951 Height: 63.5 in. Sex: Female Measured: 05/24/2016 Weight: 268.0 lbs. Indications: Celexa, Depression, Estrogen Deficient, Family History of Osteoporosis, Hypothyroid, Hysterectomy, Omeprazole, One ovary removed, Postmenopausal, Synthroid Fractures: None Treatments: None ASSESSMENT: The BMD measured at AP Spine L1-L4 is 1.110 g/cm2 with a T-score of -0.7. This patient is considered normal according to [facility]) criteria. Site Region Measured Date Measured Age YA BMD Significant CHANGE T-score AP Spine  L1-L4      4/24/2016    65.7         -0.6    1.110 g/cm2 DualFemur Neck Right 05/24/2016    [age]         -0.8    0.928 g/cm2 World Health Organization [facility]) criteria for post-menopausal, Caucasian Women: Normal       T-score at or above -1 SD Osteopenia   T-score between -1 and -2.5 SD Osteoporosis T-score at or below -2.5 SD RECOMMENDATION: [place] recommends that FDA-approved medical therapies be considered in postmenopausal women and men age 16 or older with a: 1. Hip or vertebral (clinical or morphometric) fracture. 2. T-score of <-2.5 at the spine or hip. 3. Ten-year fracture probability by FRAX of 3% or greater for hip fracture or 20% or greater for major osteoporotic fracture. All treatment decisions require clinical judgment and consideration of individual patient factors, including patient preferences, co-morbidities, previous drug use, risk factors not captured in the FRAX model (e.g. falls, vitamin D deficiency, increased bone turnover, interval significant decline in bone density) and possible under - or over-estimation of fracture risk by FRAX. All patients should ensure an adequate intake of dietary calcium (1200 mg/d) and vitamin D (800 IU daily) unless contraindicated. FOLLOW-UP: People with diagnosed cases of osteoporosis or at high risk for fracture should have regular bone mineral density tests. For patients eligible for Medicare, routine testing is allowed once every 2 years. The testing frequency can be increased to one year for patients who have rapidly progressing disease, those who are receiving or discontinuing medical therapy to restore bone mass, or have additional risk factors. I have reviewed this report, and agree with the above findings. [facility] Radiology Electronically Signed   By: Lahoma Crocker M.D.   On: 05/24/2016 14:58   Dg Spine  Portable 1 View  Result Date: 06/21/2016 CLINICAL DATA:  Intraoperative film 3.  L4-5 decompression. EXAM: PORTABLE SPINE - 1 VIEW COMPARISON:  Radiography from earlier today. FINDINGS: Spinal numbering as on preoperative MRI 02/26/2016. Unchanged positioning of retractors overlapping the L4-5 interspinous space. A probe projects just superior and posterior to  the L4-5 disc space. Disc spaces are numbered as requested. Remote L1 compression fracture. IMPRESSION: Intraoperative localization as described. Electronically Signed   By: Monte Fantasia M.D.   On: 06/21/2016 13:25   Dg Spine Portable 1 View  Result Date: 06/21/2016 CLINICAL DATA:  Intraoperative localization for decompression at L4-5 EXAM: PORTABLE SPINE - 1 VIEW COMPARISON:  Film from earlier in the same day FINDINGS: The numbering nomenclature is similar that used on the prior exam. Surgical retractors seen with instruments noted at the L4-5 level as well as posterior to the L5 vertebral body. IMPRESSION: Intraoperative localization at L4-5 Electronically Signed   By: Inez Catalina M.D.   On: 06/21/2016 12:46   Dg Spine Portable 1 View  Result Date: 06/21/2016 CLINICAL DATA:  Intraoperative localization for lumbar decompression at L4-5 EXAM: PORTABLE SPINE - 1 VIEW COMPARISON:  06/14/2016 FINDINGS: Needles are noted in the posterior soft tissues at the level of the L4 and L5 vertebral bodies posteriorly. Numbering nomenclature is similar to that utilized on prior examination. IMPRESSION: Intraoperative localization at L4-5 Electronically Signed   By: Inez Catalina M.D.   On: 06/21/2016 12:45    EKG: Orders placed or performed during the hospital encounter of 06/14/16  . EKG 12 lead  . EKG 12 lead     Hospital Course: Patient was admitted to [facility] and taken to the OR and underwent the above state procedure without complications.  Patient tolerated the procedure well and was later transferred to the recovery room and then to  the orthopaedic floor for postoperative care.  They were given PO and IV analgesics for pain control following their surgery.  They were given 24 hours of postoperative antibiotics.   PT was consulted postop to assist with mobility and transfers.  The patient was allowed to be WBAT with therapy and was taught back precautions. Discharge planning was consulted to help with postop disposition and equipment needs.  Patient had a fair night on the evening of surgery and started to get up OOB with therapy on day one. Patient was seen in rounds daily, was unable to be discharged day 1 post-op due to pain control, and was ready to go home on day two.  They were given discharge instructions and dressing directions.  They were instructed on when to follow up in the office with Dr. Tonita Cong.   Diet: Regular diet Activity:WBAT Follow-up:today Disposition - Home with HHPT Discharged Condition: good   Discharge Instructions    Call MD / Call 911    Complete by:  As directed   If you experience chest pain or shortness of breath, CALL 911 and be transported to the hospital emergency room.  If you develope a fever above 101 F, pus (white drainage) or increased drainage or redness at the wound, or calf pain, call your surgeon's office.   Constipation Prevention    Complete by:  As directed   Drink plenty of fluids.  Prune juice may be helpful.  You may use a stool softener, such as Colace (over the counter) 100 mg twice a day.  Use MiraLax (over the counter) for constipation as needed.   Diet - low sodium heart healthy    Complete by:  As directed   Increase activity slowly as tolerated    Complete by:  As directed       Medication List    STOP taking these medications   acetaminophen 500 MG tablet Commonly known as:  TYLENOL  HYDROcodone-acetaminophen 10-325 MG tablet Commonly known as:  NORCO Replaced by:  HYDROcodone-acetaminophen 5-325 MG tablet     TAKE these medications   aspirin EC 81 MG  tablet Take 1 tablet (81 mg total) by mouth at bedtime. May resume 4 days post-op What changed:  additional instructions   CALCIUM 600+D 600-400 MG-UNIT tablet Generic drug:  Calcium Carbonate-Vitamin D Take 1 tablet by mouth daily.   citalopram 40 MG tablet Commonly known as:  CELEXA Take 0.5 tablets (20 mg total) by mouth at bedtime. What changed:  how much to take   dicyclomine 10 MG capsule Commonly known as:  BENTYL TAKE 1-2 CAPSULES BY MOUTH 4 TIMES DAILY BEFORE MEALS AND AT BEDTIME. What changed:  how much to take  how to take this  when to take this  additional instructions   fexofenadine 180 MG tablet Commonly known as:  ALLEGRA Take 180 mg by mouth daily.   hydrochlorothiazide 25 MG tablet Commonly known as:  HYDRODIURIL TAKE ONE TABLET BY MOUTH ONE TIME DAILY What changed:  See the new instructions.   HYDROcodone-acetaminophen 5-325 MG tablet Commonly known as:  NORCO/VICODIN Take 1 tablet by mouth every 4 (four) hours as needed for moderate pain. Replaces:  HYDROcodone-acetaminophen 10-325 MG tablet   hydrOXYzine 50 MG capsule Commonly known as:  VISTARIL Take 150 mg by mouth at bedtime.   levothyroxine 50 MCG tablet Commonly known as:  SYNTHROID, LEVOTHROID TAKE 3 TABLETS BY MOUTH DAILY BEFORE BREAKFAST. What changed:  See the new instructions.   LORazepam 1 MG tablet Commonly known as:  ATIVAN Take 1 mg by mouth 2 (two) times daily.   metoprolol 50 MG tablet Commonly known as:  LOPRESSOR TAKE ONE TABLET BY MOUTH TWICE DAILY What changed:  See the new instructions.   MIRALAX powder Generic drug:  polyethylene glycol powder Take 17 g by mouth daily as needed for mild constipation. Reported on 05/12/2016   morphine 15 MG 12 hr tablet Commonly known as:  MS CONTIN Take 15 mg by mouth every 12 (twelve) hours.   multivitamin with minerals Tabs tablet Take 1 tablet by mouth daily.   nitroGLYCERIN 0.4 MG SL tablet Commonly known as:   NITROSTAT Place 1 tablet (0.4 mg total) under the tongue every 5 (five) minutes as needed for chest pain.   omeprazole 40 MG capsule Commonly known as:  PRILOSEC TAKE ONE CAPSULE BY MOUTH ONE TIME DAILY What changed:  See the new instructions.   potassium chloride 10 MEQ tablet Commonly known as:  K-DUR,KLOR-CON TAKE FOUR TABLETS BY MOUTH AT BEDTIME What changed:  See the new instructions.   prazosin 1 MG capsule Commonly known as:  MINIPRESS Take 1 capsule (1 mg total) by mouth at bedtime.   PROAIR HFA 108 (90 Base) MCG/ACT inhaler Generic drug:  albuterol INHALE 2 PUFFS INTO THE LUNGS EVERY 6 HOURS AS NEEDED FOR WHEEZING OR SHORTNESS OF BREATH   PROBIOTIC PO Take 1 tablet by mouth daily. Reported on 05/12/2016   prochlorperazine 10 MG tablet Commonly known as:  COMPAZINE TAKE ONE TABLET BY MOUTH TWICE A DAY AS NEEDED FOR NAUSEA OR VOMITING What changed:  See the new instructions.   ramipril 10 MG capsule Commonly known as:  ALTACE TAKE ONE CAPSULE BY MOUTH ONE TIME DAILY What changed:  See the new instructions.   tiZANidine 2 MG tablet Commonly known as:  ZANAFLEX Take 2 mg by mouth 2 (two) times daily as needed for muscle spasms.   traZODone 100  MG tablet Commonly known as:  DESYREL Take 300 mg by mouth at bedtime.   triamcinolone 55 MCG/ACT Aero nasal inhaler Commonly known as:  NASACORT Place 2 sprays into the nose daily as needed (for allergies).   vitamin B-12 1000 MCG tablet Commonly known as:  CYANOCOBALAMIN Take 1,000 mcg by mouth daily.      Follow-up Information    BEANE,JEFFREY C, MD Follow up in 2 week(s).   Specialty:  Orthopedic Surgery Contact information: [address] [place] [address] [phone]           Signed: Lacie Draft, PA-C Orthopaedic Surgery 06/23/2016, 8:41 AM

## 2016-06-24 DIAGNOSIS — G894 Chronic pain syndrome: Secondary | ICD-10-CM | POA: Diagnosis not present

## 2016-06-24 DIAGNOSIS — E669 Obesity, unspecified: Secondary | ICD-10-CM | POA: Diagnosis not present

## 2016-06-24 DIAGNOSIS — D649 Anemia, unspecified: Secondary | ICD-10-CM | POA: Diagnosis not present

## 2016-06-24 DIAGNOSIS — M1990 Unspecified osteoarthritis, unspecified site: Secondary | ICD-10-CM | POA: Diagnosis not present

## 2016-06-24 DIAGNOSIS — J449 Chronic obstructive pulmonary disease, unspecified: Secondary | ICD-10-CM | POA: Diagnosis not present

## 2016-06-24 DIAGNOSIS — F341 Dysthymic disorder: Secondary | ICD-10-CM | POA: Diagnosis not present

## 2016-06-24 DIAGNOSIS — G2581 Restless legs syndrome: Secondary | ICD-10-CM | POA: Diagnosis not present

## 2016-06-24 DIAGNOSIS — E039 Hypothyroidism, unspecified: Secondary | ICD-10-CM | POA: Diagnosis not present

## 2016-06-24 DIAGNOSIS — K219 Gastro-esophageal reflux disease without esophagitis: Secondary | ICD-10-CM | POA: Diagnosis not present

## 2016-06-24 DIAGNOSIS — Z4789 Encounter for other orthopedic aftercare: Secondary | ICD-10-CM | POA: Diagnosis not present

## 2016-06-24 DIAGNOSIS — F17219 Nicotine dependence, cigarettes, with unspecified nicotine-induced disorders: Secondary | ICD-10-CM | POA: Diagnosis not present

## 2016-06-24 DIAGNOSIS — Z6841 Body Mass Index (BMI) 40.0 and over, adult: Secondary | ICD-10-CM | POA: Diagnosis not present

## 2016-06-24 DIAGNOSIS — E785 Hyperlipidemia, unspecified: Secondary | ICD-10-CM | POA: Diagnosis not present

## 2016-06-24 DIAGNOSIS — I10 Essential (primary) hypertension: Secondary | ICD-10-CM | POA: Diagnosis not present

## 2016-06-27 DIAGNOSIS — J449 Chronic obstructive pulmonary disease, unspecified: Secondary | ICD-10-CM | POA: Diagnosis not present

## 2016-06-27 DIAGNOSIS — M1990 Unspecified osteoarthritis, unspecified site: Secondary | ICD-10-CM | POA: Diagnosis not present

## 2016-06-27 DIAGNOSIS — G894 Chronic pain syndrome: Secondary | ICD-10-CM | POA: Diagnosis not present

## 2016-06-27 DIAGNOSIS — F341 Dysthymic disorder: Secondary | ICD-10-CM | POA: Diagnosis not present

## 2016-06-27 DIAGNOSIS — Z4789 Encounter for other orthopedic aftercare: Secondary | ICD-10-CM | POA: Diagnosis not present

## 2016-06-27 DIAGNOSIS — I10 Essential (primary) hypertension: Secondary | ICD-10-CM | POA: Diagnosis not present

## 2016-06-29 DIAGNOSIS — M1990 Unspecified osteoarthritis, unspecified site: Secondary | ICD-10-CM | POA: Diagnosis not present

## 2016-06-29 DIAGNOSIS — Z4789 Encounter for other orthopedic aftercare: Secondary | ICD-10-CM | POA: Diagnosis not present

## 2016-06-29 DIAGNOSIS — J449 Chronic obstructive pulmonary disease, unspecified: Secondary | ICD-10-CM | POA: Diagnosis not present

## 2016-06-29 DIAGNOSIS — G894 Chronic pain syndrome: Secondary | ICD-10-CM | POA: Diagnosis not present

## 2016-06-29 DIAGNOSIS — I10 Essential (primary) hypertension: Secondary | ICD-10-CM | POA: Diagnosis not present

## 2016-06-29 DIAGNOSIS — F341 Dysthymic disorder: Secondary | ICD-10-CM | POA: Diagnosis not present

## 2016-07-05 DIAGNOSIS — J449 Chronic obstructive pulmonary disease, unspecified: Secondary | ICD-10-CM | POA: Diagnosis not present

## 2016-07-05 DIAGNOSIS — F341 Dysthymic disorder: Secondary | ICD-10-CM | POA: Diagnosis not present

## 2016-07-05 DIAGNOSIS — I10 Essential (primary) hypertension: Secondary | ICD-10-CM | POA: Diagnosis not present

## 2016-07-05 DIAGNOSIS — G894 Chronic pain syndrome: Secondary | ICD-10-CM | POA: Diagnosis not present

## 2016-07-05 DIAGNOSIS — M1990 Unspecified osteoarthritis, unspecified site: Secondary | ICD-10-CM | POA: Diagnosis not present

## 2016-07-05 DIAGNOSIS — Z4789 Encounter for other orthopedic aftercare: Secondary | ICD-10-CM | POA: Diagnosis not present

## 2016-07-11 DIAGNOSIS — I10 Essential (primary) hypertension: Secondary | ICD-10-CM | POA: Diagnosis not present

## 2016-07-11 DIAGNOSIS — M1990 Unspecified osteoarthritis, unspecified site: Secondary | ICD-10-CM | POA: Diagnosis not present

## 2016-07-11 DIAGNOSIS — J449 Chronic obstructive pulmonary disease, unspecified: Secondary | ICD-10-CM | POA: Diagnosis not present

## 2016-07-11 DIAGNOSIS — G894 Chronic pain syndrome: Secondary | ICD-10-CM | POA: Diagnosis not present

## 2016-07-11 DIAGNOSIS — Z4789 Encounter for other orthopedic aftercare: Secondary | ICD-10-CM | POA: Diagnosis not present

## 2016-07-11 DIAGNOSIS — F341 Dysthymic disorder: Secondary | ICD-10-CM | POA: Diagnosis not present

## 2016-07-14 DIAGNOSIS — J449 Chronic obstructive pulmonary disease, unspecified: Secondary | ICD-10-CM | POA: Diagnosis not present

## 2016-07-14 DIAGNOSIS — G894 Chronic pain syndrome: Secondary | ICD-10-CM | POA: Diagnosis not present

## 2016-07-14 DIAGNOSIS — F341 Dysthymic disorder: Secondary | ICD-10-CM | POA: Diagnosis not present

## 2016-07-14 DIAGNOSIS — I10 Essential (primary) hypertension: Secondary | ICD-10-CM | POA: Diagnosis not present

## 2016-07-14 DIAGNOSIS — M1990 Unspecified osteoarthritis, unspecified site: Secondary | ICD-10-CM | POA: Diagnosis not present

## 2016-07-14 DIAGNOSIS — Z4789 Encounter for other orthopedic aftercare: Secondary | ICD-10-CM | POA: Diagnosis not present

## 2016-07-19 DIAGNOSIS — G894 Chronic pain syndrome: Secondary | ICD-10-CM | POA: Diagnosis not present

## 2016-07-19 DIAGNOSIS — F341 Dysthymic disorder: Secondary | ICD-10-CM | POA: Diagnosis not present

## 2016-07-19 DIAGNOSIS — Z4789 Encounter for other orthopedic aftercare: Secondary | ICD-10-CM | POA: Diagnosis not present

## 2016-07-19 DIAGNOSIS — J449 Chronic obstructive pulmonary disease, unspecified: Secondary | ICD-10-CM | POA: Diagnosis not present

## 2016-07-19 DIAGNOSIS — M1990 Unspecified osteoarthritis, unspecified site: Secondary | ICD-10-CM | POA: Diagnosis not present

## 2016-07-19 DIAGNOSIS — I10 Essential (primary) hypertension: Secondary | ICD-10-CM | POA: Diagnosis not present

## 2016-07-20 ENCOUNTER — Other Ambulatory Visit: Payer: Self-pay | Admitting: Family Medicine

## 2016-08-03 ENCOUNTER — Other Ambulatory Visit: Payer: Self-pay | Admitting: *Deleted

## 2016-08-03 MED ORDER — POTASSIUM CHLORIDE CRYS ER 10 MEQ PO TBCR: EXTENDED_RELEASE_TABLET | ORAL | 99 refills | Status: DC

## 2016-08-03 NOTE — Telephone Encounter (Signed)
Refill request for 90 day supply.  Reisha Wos L, RN  

## 2016-08-17 ENCOUNTER — Ambulatory Visit (INDEPENDENT_AMBULATORY_CARE_PROVIDER_SITE_OTHER): Payer: Federal, State, Local not specified - PPO | Admitting: Family Medicine

## 2016-08-17 ENCOUNTER — Encounter: Payer: Self-pay | Admitting: Family Medicine

## 2016-08-17 VITALS — BP 124/64 | HR 60 | Ht 67.0 in | Wt 264.0 lb

## 2016-08-17 DIAGNOSIS — I10 Essential (primary) hypertension: Secondary | ICD-10-CM | POA: Diagnosis not present

## 2016-08-17 DIAGNOSIS — G894 Chronic pain syndrome: Secondary | ICD-10-CM

## 2016-08-17 DIAGNOSIS — F330 Major depressive disorder, recurrent, mild: Secondary | ICD-10-CM

## 2016-08-17 DIAGNOSIS — E039 Hypothyroidism, unspecified: Secondary | ICD-10-CM | POA: Diagnosis not present

## 2016-08-17 LAB — TSH: TSH: 0.8 mIU/L

## 2016-08-17 MED ORDER — NITROGLYCERIN 0.4 MG SL SUBL: 0.4000 mg | SUBLINGUAL_TABLET | SUBLINGUAL | 99 refills | Status: DC | PRN

## 2016-08-18 ENCOUNTER — Encounter: Payer: Self-pay | Admitting: Family Medicine

## 2016-08-18 NOTE — Assessment & Plan Note (Signed)
Established problem that has improved.  Patient appears to have benefited with reduction in Ativan dose, Reintroduction of MS Contin 15 mg BID, and surgicl removal of synovial cyst pressing on L4 nerve root on left.  She continues on Celexa and will follow up with Dr Reece Levy (Psych) in a month.  Monitor

## 2016-08-18 NOTE — Assessment & Plan Note (Signed)
Lab Results  Component Value Date   TSH 0.80 08/17/2016  Established problem Controlled Continue levothyroxine 125 mcg daily.  Recheck TSH in 6 months.

## 2016-08-18 NOTE — Assessment & Plan Note (Signed)
Adequate blood pressure control.  No evidence of new end organ damage.  Tolerating medication without significant adverse effects.  Plan to continue current blood pressure regiment.   

## 2016-08-18 NOTE — Assessment & Plan Note (Signed)
Established problem that has improved.  [name] consulted with [facility] with Dr Andree Elk and had a positive experience.  Her pain appears to be under better control since Dr Andree Elk started Gabriella Contin 15 mg BID, tizanidine, and hydrocodone/apap 5/325 mg TID prn.   [name] will see Dr Andree Elk monthly for her chronic pain medications.

## 2016-08-18 NOTE — Progress Notes (Signed)
   Subjective:    Patient ID: [name], female    DOB: 06/14/1951, 65 y.o.   MRN: [id number]  HPI  Problem List Items Addressed This Visit      High   Major depressive disorder, recurrent episode ([place])  Disease Monitoring Current symptoms include anhedonia, depressed mood and weight gain            Symptoms have been gradually worsening     Is Exercising No  Evidence of suicidal ideation: denies thoughts of self harm  Medication Monitoring Compliance: taking as prescribed Citalopram,. Trazodone bedtime, Prazosin, Hydroxyzine and Ativan   Lightheadedness no     Insomnia yes at baseline  GI symptoms stable  Ms [name] continues to see her counselor, [name],  on a regular basis.   PMH Previous treatment includes: medication To see Dr [name] in Fall       Medium   HYPERTENSION, BENIGN SYSTEMIC CHRONIC HYPERTENSION  Disease Monitoring  Blood pressure range: not checking at home  Chest pain: no   Dyspnea: no   Claudication: no   Medication compliance: yes  Medication Side Effects  Lightheadedness: no   Urinary frequency: no   Edema: yes, at baseline periphaerl leg edema     Preventitive Healthcare:  Exercise: no, secondary to shoulder and hip and low back pain.  New daily walking of stairs in her dgt's home.  Pt continues to live with her dgt.   Diet Pattern: Trying a supplement called "Nutriblast"?      Hypothyroidism Longstanding issue for patient Patient presents for evaluation of thyroid function.  Symptoms consist of none other than depression.  Symptoms have present for 2 years.  The symptoms are mild.   The problem has been stable.   Previous thyroid studies include TSH.  The hypothyroidism is due to unspecified.  I have reviewed the patient's medical history/Surgical/medications in detail; additions and changes to the history as noted in the electronic medical record.    Review of Systems See HPI    Objective:   Physical Exam VS  reviewed GEN: Alert, Cooperative, Groomed, NAD, seated, pleasant conversation COR: RRR, No M/G/R, No JVD, Normal PMI size and location LUNGS: BCTA, No Acc mm use, speaking in full sentences ABDOMEN: (+)BS, soft, NT, ND, No HSM, No palpable masses  EXT: (+) bil ankle pitting edema.   Superior buttock palpation without tenderness as before.  No midline low back pain to percussion.  Gait:speed of gait near normal, No significant path deviation, Step through +,  Psych: Normal affect/thought/speech/language. Not tearful.  Bright affect.         Assessment & Plan:  30 minutes face to face where spent in total with counseling / coordination of care took more than 50% of the total time with Counseling involved discussion of her response to pain clinic referral, back surgery, cousneling with Ms [name], and desire for weight loss now that she is more mobile and her doctors and all recommending it.  No clear plan how she could go about losing weight.  a

## 2016-08-29 ENCOUNTER — Other Ambulatory Visit: Payer: Self-pay | Admitting: Family Medicine

## 2016-08-29 DIAGNOSIS — E7800 Pure hypercholesterolemia, unspecified: Secondary | ICD-10-CM

## 2016-08-29 DIAGNOSIS — I10 Essential (primary) hypertension: Secondary | ICD-10-CM

## 2016-08-29 DIAGNOSIS — R7303 Prediabetes: Secondary | ICD-10-CM

## 2016-08-29 DIAGNOSIS — E8881 Metabolic syndrome: Secondary | ICD-10-CM

## 2016-08-29 DIAGNOSIS — K929 Disease of digestive system, unspecified: Secondary | ICD-10-CM

## 2016-08-29 DIAGNOSIS — M47896 Other spondylosis, lumbar region: Secondary | ICD-10-CM

## 2016-08-29 DIAGNOSIS — M160 Bilateral primary osteoarthritis of hip: Secondary | ICD-10-CM

## 2016-08-29 NOTE — Progress Notes (Signed)
Referral to Dr Iver Nestle for medical nutrition management

## 2016-09-03 ENCOUNTER — Other Ambulatory Visit: Payer: Self-pay | Admitting: Family Medicine

## 2016-09-26 ENCOUNTER — Encounter: Payer: Self-pay | Admitting: Family Medicine

## 2016-09-26 ENCOUNTER — Ambulatory Visit (INDEPENDENT_AMBULATORY_CARE_PROVIDER_SITE_OTHER): Payer: Federal, State, Local not specified - PPO | Admitting: Family Medicine

## 2016-09-26 DIAGNOSIS — R7303 Prediabetes: Secondary | ICD-10-CM | POA: Diagnosis not present

## 2016-09-26 NOTE — Progress Notes (Signed)
Medical Nutrition Therapy:  Appt start time: 1330 end time:  1430. PCP: Lissa Morales, MD  Assessment:  Primary concerns today: Weight management, Blood sugar control and metabolic syndrome, [id number], R73.03).   [name] has started trying to lose weight.  She has lost a significant maount of weight a couple of times (e.g., from 204 lb to 139 lb), and lost ~50 lb 4 years ago, but she has regained since her depression has worsened.  She has recently been restricting her intake to 1000-1200 kcal most days, with up to 1600 kcal in the past week.  She has been eating a lot of cucumbers, radishes, and tomatoes; breakfast is always 3 boiled eggs, 3 [place] sausages, and 1/2 tomato.     Learning Readiness: Change in progress  Usual eating pattern includes 3 meals and 1-2 snacks.   Frequent foods and beverages include cucumbers, radishes, and tomatoes, boiled eggs, [place] sausage, Dole fruit cups, Slim Jims, popcorn, Belvita biscuits, and crackers.  Avoided foods include greens, which she likes, but she is currently limiting herself to frozen dinners and cucumbers, radishes, and tomatoes with lite salt.     Usual physical activity includes none currently; limited by knee pain (OA), depression.  24-hr recall: 1415 kcal (Up at 8 AM) B (9:30 AM)-   3 boiled eggs, 3 [place] sausage links, and 1/2 tomato, water, green decaf tea Snk (11:15)-  2 Slim Jim (80 kcal)    L (12 PM)-  4 choc Belvita Breakfast crackers (230 kcal)  Snk (12:30)-  2 oz tuna salad, 6-8 crackers (150 kcal)  D (6 PM)-  [employer] broc, chs, potatoes, sliced radishes & cucumbers (230) Snk (6:30)-  2 X 100-kcal PopSecret p'corn Snk (7:30)- 4 brown sugar Belvita Breakfast crackers (230 kcal) Typical day? No.  Ate more than recent usual, for a total of no more than 1200 kcal.    Progress Towards Goal(s):  In progress.   Nutritional Diagnosis:  [place]-3.3 Overweight/obesity As related to energy imbalance.  As evidenced by BMI  >40.    Intervention:  Nutrition education.  Handouts given during visit include:  AVS  Goals sheet  Demonstrated degree of understanding via:  Teach Back  Barriers to learning/adherence to lifestyle change: Long history of yo-yo dieting and financial constraints.   Monitoring/Evaluation:  Dietary intake, exercise, and body weight in 7 week(s).

## 2016-09-26 NOTE — Patient Instructions (Addendum)
-   You do NOT want to lose weight faster than 1-2 lb per week b/c when you lose too fast, you can be sure you are losing a significant amount of muscle.  Muscle tissue is the most active calorie-burning tissue in your body.  AND maintaining muscle is extremely important as we age.    - You have done a great job in carefully choosing foods.  To sustain your positive changes (and to help ensure you are meeting your nutritional needs), you will need to start getting more variety in your foods.  In addition, using so many frozen foods have two main disadvantages:  They are high in sodium, and they are probably [facility] less satisfying than a meal you can prepare yourself.    - Keep in mind that you can use frozen meals, and supplement them with more vegetables.    - Your "homework assignment": Make three lists of foods:  Protein foods (meat, fish, poultry, eggs, dairy foods, beans and soy products); starch foods (bread, pasta, rice, potatoes, corn, all baked goods and grains); and vegetables (fresh or frozen are best).   - Make a list of 7-10 meals that include one food from each list.    Food Goals: 1. Eat at least 3 REAL meals and 1-2 snacks per day.  Aim for no more than 5 hours between eating.  Eat breakfast within one hour of getting up.   - A real meal includes a source of protein, starch, and veg's.  2. Increase (slowly and progressively) as tolerated your vegetable intake, aiming for at least half the volume of your lunch and dinner meals to be vegetables.  Look for specials at the store, especially in the frozen veg's.    Complete your goals sheet, and bring to follow-up in January.

## 2016-10-10 ENCOUNTER — Other Ambulatory Visit: Payer: Self-pay | Admitting: Family Medicine

## 2016-10-10 DIAGNOSIS — Z1231 Encounter for screening mammogram for malignant neoplasm of breast: Secondary | ICD-10-CM

## 2016-11-09 ENCOUNTER — Ambulatory Visit: Payer: Federal, State, Local not specified - PPO

## 2016-11-20 ENCOUNTER — Encounter: Payer: Self-pay | Admitting: Internal Medicine

## 2016-11-20 ENCOUNTER — Ambulatory Visit (INDEPENDENT_AMBULATORY_CARE_PROVIDER_SITE_OTHER): Payer: Federal, State, Local not specified - PPO | Admitting: Internal Medicine

## 2016-11-20 DIAGNOSIS — J069 Acute upper respiratory infection, unspecified: Secondary | ICD-10-CM

## 2016-11-20 DIAGNOSIS — B9789 Other viral agents as the cause of diseases classified elsewhere: Secondary | ICD-10-CM

## 2016-11-20 MED ORDER — BENZONATATE 200 MG PO CAPS: 200.0000 mg | ORAL_CAPSULE | Freq: Three times a day (TID) | ORAL | 0 refills | Status: DC | PRN

## 2016-11-20 NOTE — Assessment & Plan Note (Signed)
Symptoms most consistent with viral URI. Increased concern for PNA, as patient with smoking history and COPD, however patient is afebrile, denies difficulty breathing, has minimal sputum production, and lungs are CTAB. Less likely COPD exacerbation as patient with concurrent symptoms of HA and rhinorrhea in addition to cough. Conservative management for now.  - Tessalone perles TID PRN cough - Mucinex for congestion - If no improvement, consider imaging to r/o PNA

## 2016-11-20 NOTE — Patient Instructions (Addendum)
It was nice meeting you today [name]!  For your cough, you can take one Tessalon perle up to every 8 hours as needed. You can also take Mucinex as directed.  Eating a teaspoonful of honey, either alone or mixed into a warm beverage, can help with your cough as well.   You can continue to take Tylenol for your headache.   If your symptoms worsen, or you do not start to get better in about a week, please schedule another appointment.   If you have any questions or concerns, please feel free to call the clinic.   Be well,  Dr. Avon Gully

## 2016-11-20 NOTE — Progress Notes (Signed)
   Subjective:   Patient: [name]       Birthdate: September 08, 1951       MRN: [id number]      HPI  [name] is a 66 y.o. female presenting for congestion and HA.   Symptoms began three days ago. Since then she has had headaches, productive cough, a scratchy (but not sore) throat, and fatigue. Patient describes cough as occasionally productive of yellow sputum without blood. Denies fevers. Slightly decreased appetite, but patient has been drinking plenty of water as well as ginger ale. Endorses some wheezing but no difficulty breathing. Patient has history of COPD for which she has previously been prescribed inhalers, but she has not had to use them. She has tried cough drops and Tylenol but has not taken anything else due to concerns about interactions with her prescription medications. Headache was most bothersome symptom until today, when congestion is worse.  Patient has had a flu shot this year.   Smoking status reviewed. Patient is former smoker (quit 9 years ago).   Review of Systems See HPI.     Objective:  Physical Exam  Constitutional: She is oriented to person, place, and time and well-developed, well-nourished, and in no distress.  HENT:  Head: Normocephalic and atraumatic.  Mouth/Throat: Oropharynx is clear and moist. No oropharyngeal exudate.  Clear nasal drainage present  Eyes: Conjunctivae and EOM are normal. Right eye exhibits no discharge. Left eye exhibits no discharge.  Cardiovascular: Normal rate, regular rhythm and normal heart sounds.   No murmur heard. Pulmonary/Chest:  Coughing throughout encounter, though lungs CTAB without wheezes, rales, or rhonchi  Neurological: She is alert and oriented to person, place, and time.  Skin: Skin is warm and dry.  Psychiatric: Affect and judgment normal.      Assessment & Plan:  URI (upper respiratory infection) Symptoms most consistent with viral URI. Increased concern for PNA, as patient with smoking history and  COPD, however patient is afebrile, denies difficulty breathing, has minimal sputum production, and lungs are CTAB. Less likely COPD exacerbation as patient with concurrent symptoms of HA and rhinorrhea in addition to cough. Conservative management for now.  - Tessalone perles TID PRN cough - Mucinex for congestion - If no improvement, consider imaging to r/o PNA   Adin Hector, MD, MPH PGY-2 [facility] Pager [phone]

## 2016-11-21 ENCOUNTER — Ambulatory Visit: Payer: Federal, State, Local not specified - PPO | Admitting: Family Medicine

## 2016-11-23 ENCOUNTER — Ambulatory Visit: Payer: Federal, State, Local not specified - PPO

## 2016-12-01 ENCOUNTER — Telehealth: Payer: Self-pay | Admitting: *Deleted

## 2016-12-01 ENCOUNTER — Ambulatory Visit
Admission: RE | Admit: 2016-12-01 | Discharge: 2016-12-01 | Disposition: A | Discharge status: Home / Self Care | Payer: Federal, State, Local not specified - PPO | Source: Ambulatory Visit | Attending: Family Medicine | Admitting: Family Medicine

## 2016-12-01 DIAGNOSIS — Z1231 Encounter for screening mammogram for malignant neoplasm of breast: Secondary | ICD-10-CM

## 2016-12-01 NOTE — Telephone Encounter (Signed)
AWV scheduled for 12/05/2016 at 2:30pm

## 2016-12-05 ENCOUNTER — Ambulatory Visit (INDEPENDENT_AMBULATORY_CARE_PROVIDER_SITE_OTHER): Payer: Federal, State, Local not specified - PPO | Admitting: *Deleted

## 2016-12-05 ENCOUNTER — Encounter: Payer: Self-pay | Admitting: *Deleted

## 2016-12-05 VITALS — BP 136/74 | HR 58 | Temp 98.1°F | Ht 63.5 in | Wt 238.6 lb

## 2016-12-05 DIAGNOSIS — Z0000 Encounter for general adult medical examination without abnormal findings: Secondary | ICD-10-CM

## 2016-12-05 NOTE — Progress Notes (Signed)
Subjective:   [name] is a 66 y.o. female who presents for an Initial Medicare Annual Wellness Visit.  Cardiac Risk Factors include: advanced age (>2men, >58 women);dyslipidemia;hypertension;obesity (BMI >30kg/m2);smoking/ tobacco exposure     Objective:    Today's Vitals   12/05/16 1428 12/05/16 1430  BP:  136/74  Pulse:  (!) 58  Temp:  98.1 F (36.7 C)  SpO2:  93%  Weight: 238 lb 9.6 oz (108.2 kg)   Height: 5' 3.5" (1.613 m)   PainSc:  5    Body mass index is 41.6 kg/m.   Current Medications (verified) Outpatient Encounter Prescriptions as of 12/05/2016  Medication Sig  . aspirin EC 81 MG tablet Take 1 tablet (81 mg total) by mouth at bedtime. May resume 4 days post-op  . Calcium Carbonate-Vitamin D (CALCIUM 600+D) 600-400 MG-UNIT per tablet Take 1 tablet by mouth daily.   . citalopram (CELEXA) 40 MG tablet Take 0.5 tablets (20 mg total) by mouth at bedtime. (Patient taking differently: Take 40 mg by mouth at bedtime. )  . dicyclomine (BENTYL) 10 MG capsule TAKE 1-2 CAPSULES BY MOUTH 4 TIMES DAILY BEFORE MEALS AND AT BEDTIME. (Patient taking differently: Take 20 mg by mouth 4 (four) times daily -  before meals and at bedtime. )  . fexofenadine (ALLEGRA) 180 MG tablet Take 180 mg by mouth daily.  . hydrochlorothiazide (HYDRODIURIL) 25 MG tablet Take 1 tablet (25 mg total) by mouth daily.  Marland Kitchen HYDROcodone-acetaminophen (NORCO/VICODIN) 5-325 MG tablet Take 1 tablet by mouth every 4 (four) hours as needed for moderate pain. (Patient taking differently: Take 1 tablet by mouth 3 (three) times daily as needed for moderate pain. Prescribed by Dr [facility])  . hydrOXYzine (VISTARIL) 50 MG capsule Take 150 mg by mouth at bedtime.   Marland Kitchen levothyroxine (SYNTHROID, LEVOTHROID) 50 MCG tablet TAKE 3 TABLETS BY MOUTH DAILY BEFORE BREAKFAST. (Patient taking differently: TAKE 2.5 TABLETS (125 mcg) BY MOUTH DAILY BEFORE BREAKFAST.)  . LORazepam (ATIVAN) 1 MG tablet Take  1 mg by mouth 2 (two) times daily.   . metoprolol (LOPRESSOR) 50 MG tablet TAKE ONE TABLET BY MOUTH TWICE DAILY (Patient taking differently: TAKE ONE TABLET (50 mg) BY MOUTH TWICE DAILY)  . morphine (MS CONTIN) 15 MG 12 hr tablet Take 15 mg by mouth every 12 (twelve) hours.   . Multiple Vitamin (MULTIVITAMIN WITH MINERALS) TABS tablet Take 1 tablet by mouth daily.  . nitroGLYCERIN (NITROSTAT) 0.4 MG SL tablet Place 1 tablet (0.4 mg total) under the tongue every 5 (five) minutes as needed for chest pain.  Marland Kitchen omeprazole (PRILOSEC) 40 MG capsule TAKE ONE CAPSULE BY MOUTH ONE TIME DAILY (Patient taking differently: TAKE ONE CAPSULE (40 mg) BY MOUTH ONE TIME DAILY)  . Polyethyl Glycol-Propyl Glycol (SYSTANE) 0.4-0.3 % SOLN Apply to eye.  . polyethylene glycol (MIRALAX) powder Take 17 g by mouth daily as needed for mild constipation. Reported on 05/12/2016  . potassium chloride (KLOR-CON M10) 10 MEQ tablet TAKE FOUR TABLETS BY MOUTH AT BEDTIME  . prazosin (MINIPRESS) 1 MG capsule Take 1 capsule (1 mg total) by mouth at bedtime.  Marland Kitchen PROAIR HFA 108 (90 Base) MCG/ACT inhaler INHALE 2 PUFFS INTO THE LUNGS EVERY 6 HOURS AS NEEDED FOR WHEEZING OR SHORTNESS OF BREATH  . Probiotic Product (PROBIOTIC PO) Take 1 tablet by mouth daily. Reported on 05/12/2016  . prochlorperazine (COMPAZINE) 10 MG tablet TAKE ONE TABLET BY MOUTH TWICE A DAY AS NEEDED FOR NAUSEA  OR VOMITING  . ramipril (ALTACE) 10 MG capsule TAKE ONE CAPSULE BY MOUTH ONE TIME DAILY  . tiZANidine (ZANAFLEX) 2 MG tablet Take 2 mg by mouth 2 (two) times daily as needed for muscle spasms.   . traZODone (DESYREL) 100 MG tablet Take 300 mg by mouth at bedtime.   . triamcinolone (NASACORT) 55 MCG/ACT AERO nasal inhaler Place 2 sprays into the nose daily as needed (for allergies).   . vitamin B-12 (CYANOCOBALAMIN) 1000 MCG tablet Take 1,000 mcg by mouth daily.  . benzonatate (TESSALON) 200 MG capsule Take 1 capsule (200 mg total) by mouth 3 (three) times daily as  needed for cough. (Patient not taking: Reported on 12/05/2016)   No facility-administered encounter medications on file as of 12/05/2016.     Allergies (verified) Aripiprazole; Diclofenac-misoprostol; Emsam [selegiline]; Estradiol; Ibuprofen; Naproxen; Neurontin [gabapentin]; Paroxetine; Prednisone; and Tape   History: Past Medical History:  Diagnosis Date  . Adrenal nodule ([place]) 04/08/2011   04/08/11 Abdominal CT showed Left adrenal nodule which is tachnically indeterminate but most likely an adenoma.  It is 1.7 cm and 42 HU on portal venous phase image. 31 HU on delayed images.   01/09/2012 Abdominal CT w/ & w/o CM: Stable benign left adrenal adenoma. No further specific follow-up is needed for this finding.    . Allergic rhinitis 01/03/2007   Qualifier: Diagnosis of  By: Drucie Ip    . Anemia    hx of   . Anxiety   . Carpal tunnel syndrome 02/14/2007   S/P carpel tunnel release bilaterally.    . Cataracts, bilateral   . Chronic pain syndrome 12/24/2008  . COPD WITHOUT EXACERBATION 01/03/2007   Qualifier: Diagnosis of  By: Drucie Ip    . DISC WITH RADICULOPATHY 01/03/2007   Qualifier: Diagnosis of  By: Drucie Ip    . Esophageal reflux 01/03/2007   Centricity Description: GASTROESOPHAGEAL REFLUX, NO ESOPHAGITIS Qualifier: Diagnosis of  By: Drucie Ip   Centricity Description: GERD Qualifier: Diagnosis of  By: Tye Savoy MD, Tommi Rumps    . Functional gastrointestinal disorder 06/18/2008   Functional Gastrointestinal Disorder with frequent eructation and bloating sensation with acute flares.  Responsive to Compazine and Bentyl.  Takes a few days to calm down.    Marland Kitchen HEMORRHOIDS, NOS 01/03/2007   Qualifier: Diagnosis of  By: Drucie Ip    . Hepatic steatosis 04/12/2011  . Herpes simplex labialis 05/11/2011  . Hip osteoarthritis 05/23/2007   MRI Pelvis (03/13/07) Bilateral osteoarthritis of the hips, left greater than right.  The  patient has a slightly more prominent hip effusion on  the left than  the right.  No other significant abnormality.    . History of MRSA infection 04/28/2011  . HYPERCHOLESTEROLEMIA 12/24/2008  . Hypertension   . HYPERTENSION, BENIGN SYSTEMIC 01/03/2007   Qualifier: Diagnosis of  By: Drucie Ip    . HYPERTRIGLYCERIDEMIA 11/11/2007   Qualifier: Diagnosis of  By: McDiarmid MD, Sherren Mocha    . Hypothyroidism 02/15/2007   Qualifier: Diagnosis of  By: McDiarmid MD, Sherren Mocha    . HYPOTHYROIDISM, BORDERLINE 02/15/2007  . Irritable bowel syndrome 01/03/2007   Qualifier: Diagnosis of  By: Drucie Ip    . LACTOSE INTOLERANCE 03/10/2008  . Major depressive disorder, recurrent episode ([place]) 01/03/2007   Qualifier: Diagnosis of  By: Drucie Ip    . Obesity (BMI 30.0-34.9) 09/30/2007   Has lost 55 lbs since easter. Goal of <190 by 08/05/12.     . Obesity, Class III, BMI 40-49.9 (morbid obesity) ([place])  09/30/2007       . Osteoarthritis of spine 01/03/2007   MR I Lumbar (08/03): GeneralDDD; L2-3 Large  HNP.  Mod pos  hnp - 12/14/2005, smhnpw/irritL4root - 12/14/2005    . OSTEOARTHRITIS, HANDS, BILATERAL 06/18/2008   Qualifier: Diagnosis of  By: McDiarmid MD, Sherren Mocha    . PATELLO FEMORAL STRESS SYNDROME 01/03/2007   Qualifier: Diagnosis of  By: Drucie Ip    . Periodic limb movements of sleep 12/05/2010   Diagnosed on Polysomnography testing Fall 2011.     . Pre-diabetes 12/25/2008   Qualifier: Diagnosis of  By: McDiarmid MD, Sherren Mocha    . RESTLESS LEGS SYNDROME 09/11/2007   Polysomnography (10/2010, Dr Keturah Barre, interpreter. ) showed periodic limb movements that frequently awoke patient from sleep with arousal index of 4 per hour. Dr Danton Sewer (Sleep Specialist) thought her RLS was the major contributor to patient poor sleep condition, more than any sleep apnea.  He recommended considering opamine agonist (either requip or mirapex) to see if this will help.     . Rosacea, acne 10/24/2011  . SIGMOID POLYP 01/23/2008  . Solitary lung nodule 04/08/2011  . Stress  incontinence   . Synovial cyst of lumbar facet joint 02/17/2016  . Urticaria, chronic 05/22/2011  . VITAMIN D DEFICIENCY 03/03/2010   Past Surgical History:  Procedure Laterality Date  . bladder tack  1998   Dr Jeffie Pollock (urology)  . BUNIONECTOMY  1992   bilateral feet with pins in great toes  . CARDIAC CATHETERIZATION    . CARPAL TUNNEL RELEASE  2010   Bilateral, Dr Theodis Sato  . COLONOSCOPY  2004   Dr Verdia Kuba (GI)  . CYSTOCELE REPAIR  1998    Dr Gertie Fey (GYN)-  . ESOPHAGOGASTRODUODENOSCOPY    . EXPLORATORY LAPAROTOMY    . FACIAL LACERATIONS REPAIR     Facial Laceration repair as adolescent   . INJECTION HIP INTRA ARTICULAR  2008   Left Hip fluoroscopically-guided corticosteorid injection for painful osteoarthritis with good effect (06/2007)  . LAPAROSCOPY FOR ECTOPIC PREGNANCY    . LUMBAR LAMINECTOMY/DECOMPRESSION MICRODISCECTOMY N/A 06/21/2016   Procedure: MICRO LUMBAR DECOMPRESSION L4-L5 AND REMOVAL OF SYNOVIAL CYST    1 LEVEL;  Surgeon: Susa Day, MD;  Location: [facility];  Service: Orthopedics;  Laterality: N/A;  . NM MYOVIEW LTD  2011   Dr Daneen Schick, III (Card)  . RECTOCELE REPAIR  1998   Dr Gertie Fey (GYN)   Family History  Problem Relation Age of Onset  . Diabetes Father   . Heart disease Father   . Diabetes type II    . Hypertension    . Heart attack    . Alcohol abuse    . Depression    . Asthma    . Migraines    . Hypertension Mother   . Hypertension Sister   . Obesity Sister   . Kidney disease Brother   . Hypertension Daughter   . Hypertension Son   . Heart disease Brother   . Hypertension Brother   . Bipolar disorder Daughter   . Hypertension Daughter   . Hypertension Son    Social History   Occupational History  . cashier [employer]   Social History Main Topics  . Smoking status: Former Smoker    Packs/day: 1.50    Years: 44.00    Types: Cigarettes    Quit date: 04/06/2008  . Smokeless tobacco: Never Used  . Alcohol use No  . Drug use: No  Comment: hx of marijuana use - last used 2 months ago   . Sexual activity: No    Tobacco Counseling Counseling given: Yes Patient is former smoker with no plans to restart   Activities of Daily Living In your present state of health, do you have any difficulty performing the following activities: 12/05/2016 06/21/2016  Hearing? N N  Vision? N N  Difficulty concentrating or making decisions? Y N  Walking or climbing stairs? Y Y  Dressing or bathing? Y N  Doing errands, shopping? N -  Preparing Food and eating ? N -  Using the Toilet? N -  In the past six months, have you accidently leaked urine? Y -  Do you have problems with loss of bowel control? N -  Managing your Medications? N -  Managing your Finances? N -  Housekeeping or managing your Housekeeping? Y -  Some recent data might be hidden   Home Safety:  My home has a working smoke alarm:  Yes, in every room           My home throw rugs have been fastened down to the floor or removed:  Have non-slip backs I have non-slip mats in the bathtub and shower:  Non-slip surface         All my home's stairs have railings or bannisters: Two level home with one outside step         My home's floors, stairs and hallways are free from clutter, wires and cords:  Yes     I wear seatbelts consistently:  Yes   Immunizations and Health Maintenance Immunization History  Administered Date(s) Administered  . Influenza Split 07/22/2012  . Influenza Whole 08/07/2007, 08/07/2008, 08/10/2009, 07/22/2010  . Influenza-Unspecified 07/06/2013, 07/06/2014, 06/09/2015, 07/07/2016  . Pneumococcal Conjugate-13 10/07/2015  . Pneumococcal Polysaccharide-23 08/20/2014  . Td 03/06/2002  . Tdap 10/20/2013   There are no preventive care reminders to display for this patient.  Patient Care Team: Blane Ohara McDiarmid, MD as PCP - [facility] as Consulting Physician (Pain Medicine) Susa Day, MD as Consulting Physician  (Orthopedic Surgery) Kennith Center, RD as Dietitian (Family Medicine) Dr. Alanda Slim at [place] at [address]. [facility] management  Indicate any recent Medical Services you may have received from other than [facility] providers in the past year (date may be approximate).     Assessment:   This is a routine wellness examination for Michaele.   Hearing/Vision screen  Hearing Screening   Method: Audiometry   125Hz  250Hz  500Hz  1000Hz  2000Hz  3000Hz  4000Hz  6000Hz  8000Hz   Right ear:   40 40 40  40    Left ear:   40 40 40  40      Dietary issues and exercise activities discussed: Current Exercise Habits: Home exercise routine, Type of exercise: walking (Climbing stairs), Time (Minutes): 10, Frequency (Times/Week): 7, Weekly Exercise (Minutes/Week): 70, Intensity: Mild, Exercise limited by: orthopedic condition(s);psychological condition(s)  Goals    . Blood Pressure < 150/90    . HEMOGLOBIN A1C < 7.0    . Weight (lb) < 226 lb (102.5 kg)          5% weight loss     Discussed recording consumption intake to assist with desired 5% weight loss. Recommended MyPLate or My Fitness Pal apps to assist with recording. Discussed engaging in physical activity 150 min/week  Depression Screen [facility] 2/9 Scores 12/05/2016 08/17/2016 04/13/2016 02/17/2016 01/21/2016 12/09/2015 11/12/2015  PHQ - 2 Score 2 3 0 6 5 2 2   PHQ- 9 Score 6 12 - 18 17 12 9     Fall Risk Fall Risk  12/05/2016 08/17/2016 05/11/2016 04/13/2016 09/09/2015  Falls in the past year? No No No No No  Number falls in past yr: - - - - -  Risk Factor Category  - - - - -  Risk for fall due to : - - - - -   TUG Test:  Done in 12 seconds. Patient used both hands to sit back down.  Cognitive Function: Mini-Cog  Passed with score 4/5   Screening Tests Health Maintenance  Topic Date Due  . COLONOSCOPY  12/28/2017  . MAMMOGRAM  12/01/2018  . PNA vac Low Risk Adult (2 of 2 - PPSV23) 08/21/2019  . TETANUS/TDAP  10/21/2023    . INFLUENZA VACCINE  Addressed  . DEXA SCAN  Completed  . ZOSTAVAX  Completed  . Hepatitis C Screening  Completed      Plan:     During the course of the visit, Carmel was educated and counseled about the following appropriate screening and preventive services:   Vaccines to include Pneumoccal, Influenza, Td, Zostavax  Cardiovascular disease screening  Colorectal cancer screening  Bone density screening  Diabetes screening  Mammography/PAP  Nutrition counseling   Patient Instructions (the written plan) were given to the patient.    Velora Heckler, RN   12/05/2016

## 2016-12-05 NOTE — Patient Instructions (Signed)
Fat and Cholesterol Restricted Diet Introduction Getting too much fat and cholesterol in your diet may cause health problems. Following this diet helps keep your fat and cholesterol at normal levels. This can keep you from getting sick. What types of fat should I choose?  Choose monosaturated and polyunsaturated fats. These are found in foods such as olive oil, canola oil, flaxseeds, walnuts, almonds, and seeds.  Eat more omega-3 fats. Good choices include salmon, mackerel, sardines, tuna, flaxseed oil, and ground flaxseeds.  Limit saturated fats. These are in animal products such as meats, butter, and cream. They can also be in plant products such as palm oil, palm kernel oil, and coconut oil.  Avoid foods with partially hydrogenated oils in them. These contain trans fats. Examples of foods that have trans fats are stick margarine, some tub margarines, cookies, crackers, and other baked goods. What general guidelines do I need to follow?  Check food labels. Look for the words "trans fat" and "saturated fat."  When preparing a meal:  Fill half of your plate with vegetables and green salads.  Fill one fourth of your plate with whole grains. Look for the word "whole" as the first word in the ingredient list.  Fill one fourth of your plate with lean protein foods.  Eat more foods that have fiber, like apples, carrots, beans, peas, and barley.  Eat more home-cooked foods. Eat less at restaurants and buffets.  Limit or avoid alcohol.  Limit foods high in starch and sugar.  Limit fried foods.  Cook foods without frying them. Baking, boiling, grilling, and broiling are all great options.  Lose weight if you are overweight. Losing even a small amount of weight can help your overall health. It can also help prevent diseases such as diabetes and heart disease. What foods can I eat? Grains  Whole grains, such as whole wheat or whole grain breads, crackers, cereals, and pasta.  Unsweetened oatmeal, bulgur, barley, quinoa, or brown rice. Corn or whole wheat flour tortillas. Vegetables  Fresh or frozen vegetables (raw, steamed, roasted, or grilled). Green salads. Fruits  All fresh, canned (in natural juice), or frozen fruits. Meat and Other Protein Products  Ground beef (85% or leaner), grass-fed beef, or beef trimmed of fat. Skinless chicken or [place]. Ground chicken or [place]. Pork trimmed of fat. All fish and seafood. Eggs. Dried beans, peas, or lentils. Unsalted nuts or seeds. Unsalted canned or dry beans. Dairy  Low-fat dairy products, such as skim or 1% milk, 2% or reduced-fat cheeses, low-fat ricotta or cottage cheese, or plain low-fat yogurt. Fats and Oils  Tub margarines without trans fats. Light or reduced-fat mayonnaise and salad dressings. Avocado. Olive, canola, sesame, or safflower oils. Natural peanut or almond butter (choose ones without added sugar and oil). The items listed above may not be a complete list of recommended foods or beverages. Contact your dietitian for more options.  What foods are not recommended? Grains  White bread. White pasta. White rice. Cornbread. Bagels, pastries, and croissants. Crackers that contain trans fat. Vegetables  White potatoes. Corn. Creamed or fried vegetables. Vegetables in a cheese sauce. Fruits  Dried fruits. Canned fruit in light or heavy syrup. Fruit juice. Meat and Other Protein Products  Fatty cuts of meat. Ribs, chicken wings, bacon, sausage, bologna, salami, chitterlings, fatback, hot dogs, bratwurst, and packaged luncheon meats. Liver and organ meats. Dairy  Whole or 2% milk, cream, half-and-half, and cream cheese. Whole milk cheeses. Whole-fat or sweetened yogurt. Full-fat cheeses. Nondairy creamers  and whipped toppings. Processed cheese, cheese spreads, or cheese curds. Sweets and Desserts  Corn syrup, sugars, honey, and molasses. Candy. Jam and jelly. Syrup. Sweetened cereals. Cookies, pies, cakes,  donuts, muffins, and ice cream. Fats and Oils  Butter, stick margarine, lard, shortening, ghee, or bacon fat. Coconut, palm kernel, or palm oils. Beverages  Alcohol. Sweetened drinks (such as sodas, lemonade, and fruit drinks or punches). The items listed above may not be a complete list of foods and beverages to avoid. Contact your dietitian for more information.  This information is not intended to replace advice given to you by your health care provider. Make sure you discuss any questions you have with your health care provider. Document Released: 04/23/2012 Document Revised: 06/29/2016 Document Reviewed: 01/22/2014  2017 [employer]   Fall Prevention in the Home Introduction Falls can cause injuries. They can happen to people of all ages. There are many things you can do to make your home safe and to help prevent falls. What can I do on the outside of my home?  Regularly fix the edges of walkways and driveways and fix any cracks.  Remove anything that might make you trip as you walk through a door, such as a raised step or threshold.  Trim any bushes or trees on the path to your home.  Use bright outdoor lighting.  Clear any walking paths of anything that might make someone trip, such as rocks or tools.  Regularly check to see if handrails are loose or broken. Make sure that both sides of any steps have handrails.  Any raised decks and porches should have guardrails on the edges.  Have any leaves, snow, or ice cleared regularly.  Use sand or salt on walking paths during winter.  Clean up any spills in your garage right away. This includes oil or grease spills. What can I do in the bathroom?  Use night lights.  Install grab bars by the toilet and in the tub and shower. Do not use towel bars as grab bars.  Use non-skid mats or decals in the tub or shower.  If you need to sit down in the shower, use a plastic, non-slip stool.  Keep the floor dry. Clean up any water that  spills on the floor as soon as it happens.  Remove soap buildup in the tub or shower regularly.  Attach bath mats securely with double-sided non-slip rug tape.  Do not have throw rugs and other things on the floor that can make you trip. What can I do in the bedroom?  Use night lights.  Make sure that you have a light by your bed that is easy to reach.  Do not use any sheets or blankets that are too big for your bed. They should not hang down onto the floor.  Have a firm chair that has side arms. You can use this for support while you get dressed.  Do not have throw rugs and other things on the floor that can make you trip. What can I do in the kitchen?  Clean up any spills right away.  Avoid walking on wet floors.  Keep items that you use a lot in easy-to-reach places.  If you need to reach something above you, use a strong step stool that has a grab bar.  Keep electrical cords out of the way.  Do not use floor polish or wax that makes floors slippery. If you must use wax, use non-skid floor wax.  Do not have  throw rugs and other things on the floor that can make you trip. What can I do with my stairs?  Do not leave any items on the stairs.  Make sure that there are handrails on both sides of the stairs and use them. Fix handrails that are broken or loose. Make sure that handrails are as long as the stairways.  Check any carpeting to make sure that it is firmly attached to the stairs. Fix any carpet that is loose or worn.  Avoid having throw rugs at the top or bottom of the stairs. If you do have throw rugs, attach them to the floor with carpet tape.  Make sure that you have a light switch at the top of the stairs and the bottom of the stairs. If you do not have them, ask someone to add them for you. What else can I do to help prevent falls?  Wear shoes that:  Do not have high heels.  Have rubber bottoms.  Are comfortable and fit you well.  Are closed at the  toe. Do not wear sandals.  If you use a stepladder:  Make sure that it is fully opened. Do not climb a closed stepladder.  Make sure that both sides of the stepladder are locked into place.  Ask someone to hold it for you, if possible.  Clearly mark and make sure that you can see:  Any grab bars or handrails.  First and last steps.  Where the edge of each step is.  Use tools that help you move around (mobility aids) if they are needed. These include:  Canes.  Walkers.  Scooters.  Crutches.  Turn on the lights when you go into a dark area. Replace any light bulbs as soon as they burn out.  Set up your furniture so you have a clear path. Avoid moving your furniture around.  If any of your floors are uneven, fix them.  If there are any pets around you, be aware of where they are.  Review your medicines with your doctor. Some medicines can make you feel dizzy. This can increase your chance of falling. Ask your doctor what other things that you can do to help prevent falls. This information is not intended to replace advice given to you by your health care provider. Make sure you discuss any questions you have with your health care provider. Document Released: 08/19/2009 Document Revised: 03/30/2016 Document Reviewed: 11/27/2014  2017 [employer]  Health Maintenance, Female Introduction Adopting a healthy lifestyle and getting preventive care can go a long way to promote health and wellness. Talk with your health care provider about what schedule of regular examinations is right for you. This is a good chance for you to check in with your provider about disease prevention and staying healthy. In between checkups, there are plenty of things you can do on your own. Experts have done a lot of research about which lifestyle changes and preventive measures are most likely to keep you healthy. Ask your health care provider for more information. Weight and diet Eat a healthy  diet  Be sure to include plenty of vegetables, fruits, low-fat dairy products, and lean protein.  Do not eat a lot of foods high in solid fats, added sugars, or salt.  Get regular exercise. This is one of the most important things you can do for your health.  Most adults should exercise for at least 150 minutes each week. The exercise should increase your heart rate and make  you sweat (moderate-intensity exercise).  Most adults should also do strengthening exercises at least twice a week. This is in addition to the moderate-intensity exercise. Maintain a healthy weight  Body mass index (BMI) is a measurement that can be used to identify possible weight problems. It estimates body fat based on height and weight. Your health care provider can help determine your BMI and help you achieve or maintain a healthy weight.  For females 54 years of age and older:  A BMI below 18.5 is considered underweight.  A BMI of 18.5 to 24.9 is normal.  A BMI of 25 to 29.9 is considered overweight.  A BMI of 30 and above is considered obese. Watch levels of cholesterol and blood lipids  You should start having your blood tested for lipids and cholesterol at 67 years of age, then have this test every 5 years.  You may need to have your cholesterol levels checked more often if:  Your lipid or cholesterol levels are high.  You are older than 67 years of age.  You are at high risk for heart disease. Cancer screening Lung Cancer  Lung cancer screening is recommended for adults 61-67 years old who are at high risk for lung cancer because of a history of smoking.  A yearly low-dose CT scan of the lungs is recommended for people who:  Currently smoke.  Have quit within the past 15 years.  Have at least a 30-pack-year history of smoking. A pack year is smoking an average of one pack of cigarettes a day for 1 year.  Yearly screening should continue until it has been 15 years since you quit.  Yearly  screening should stop if you develop a health problem that would prevent you from having lung cancer treatment. Breast Cancer  Practice breast self-awareness. This means understanding how your breasts normally appear and feel.  It also means doing regular breast self-exams. Let your health care provider know about any changes, no matter how small.  If you are in your 20s or 30s, you should have a clinical breast exam (CBE) by a health care provider every 1-3 years as part of a regular health exam.  If you are 30 or older, have a CBE every year. Also consider having a breast X-ray (mammogram) every year.  If you have a family history of breast cancer, talk to your health care provider about genetic screening.  If you are at high risk for breast cancer, talk to your health care provider about having an MRI and a mammogram every year.  Breast cancer gene (BRCA) assessment is recommended for women who have family members with BRCA-related cancers. BRCA-related cancers include:  Breast.  Ovarian.  Tubal.  Peritoneal cancers.  Results of the assessment will determine the need for genetic counseling and BRCA1 and BRCA2 testing. Cervical Cancer  Your health care provider may recommend that you be screened regularly for cancer of the pelvic organs (ovaries, uterus, and vagina). This screening involves a pelvic examination, including checking for microscopic changes to the surface of your cervix (Pap test). You may be encouraged to have this screening done every 3 years, beginning at age 53.  For women ages 59-65, health care providers may recommend pelvic exams and Pap testing every 3 years, or they may recommend the Pap and pelvic exam, combined with testing for human papilloma virus (HPV), every 5 years. Some types of HPV increase your risk of cervical cancer. Testing for HPV may also be done on women  of any age with unclear Pap test results.  Other health care providers may not recommend any  screening for nonpregnant women who are considered low risk for pelvic cancer and who do not have symptoms. Ask your health care provider if a screening pelvic exam is right for you.  If you have had past treatment for cervical cancer or a condition that could lead to cancer, you need Pap tests and screening for cancer for at least 20 years after your treatment. If Pap tests have been discontinued, your risk factors (such as having a new sexual partner) need to be reassessed to determine if screening should resume. Some women have medical problems that increase the chance of getting cervical cancer. In these cases, your health care provider may recommend more frequent screening and Pap tests. Colorectal Cancer  This type of cancer can be detected and often prevented.  Routine colorectal cancer screening usually begins at 67 years of age and continues through 67 years of age.  Your health care provider may recommend screening at an earlier age if you have risk factors for colon cancer.  Your health care provider may also recommend using home test kits to check for hidden blood in the stool.  A small camera at the end of a tube can be used to examine your colon directly (sigmoidoscopy or colonoscopy). This is done to check for the earliest forms of colorectal cancer.  Routine screening usually begins at age 12.  Direct examination of the colon should be repeated every 5-10 years through 67 years of age. However, you may need to be screened more often if early forms of precancerous polyps or small growths are found. Skin Cancer  Check your skin from head to toe regularly.  Tell your health care provider about any new moles or changes in moles, especially if there is a change in a mole's shape or color.  Also tell your health care provider if you have a mole that is larger than the size of a pencil eraser.  Always use sunscreen. Apply sunscreen liberally and repeatedly throughout the  day.  Protect yourself by wearing long sleeves, pants, a wide-brimmed hat, and sunglasses whenever you are outside. Heart disease, diabetes, and high blood pressure  High blood pressure causes heart disease and increases the risk of stroke. High blood pressure is more likely to develop in:  People who have blood pressure in the high end of the normal range (130-139/85-89 mm Hg).  People who are overweight or obese.  People who are African American.  If you are 66-72 years of age, have your blood pressure checked every 3-5 years. If you are 75 years of age or older, have your blood pressure checked every year. You should have your blood pressure measured twice-once when you are at a hospital or clinic, and once when you are not at a hospital or clinic. Record the average of the two measurements. To check your blood pressure when you are not at a hospital or clinic, you can use:  An automated blood pressure machine at a pharmacy.  A home blood pressure monitor.  If you are between 36 years and 46 years old, ask your health care provider if you should take aspirin to prevent strokes.  Have regular diabetes screenings. This involves taking a blood sample to check your fasting blood sugar level.  If you are at a normal weight and have a low risk for diabetes, have this test once every three years after 67  years of age.  If you are overweight and have a high risk for diabetes, consider being tested at a younger age or more often. Preventing infection Hepatitis B  If you have a higher risk for hepatitis B, you should be screened for this virus. You are considered at high risk for hepatitis B if:  You were born in a country where hepatitis B is common. Ask your health care provider which countries are considered high risk.  Your parents were born in a high-risk country, and you have not been immunized against hepatitis B (hepatitis B vaccine).  You have HIV or AIDS.  You use needles to  inject street drugs.  You live with someone who has hepatitis B.  You have had sex with someone who has hepatitis B.  You get hemodialysis treatment.  You take certain medicines for conditions, including cancer, organ transplantation, and autoimmune conditions. Hepatitis C  Blood testing is recommended for:  Everyone born from [birth year] through 1966.  Anyone with known risk factors for hepatitis C. Sexually transmitted infections (STIs)  You should be screened for sexually transmitted infections (STIs) including gonorrhea and chlamydia if:  You are sexually active and are younger than 67 years of age.  You are older than 67 years of age and your health care provider tells you that you are at risk for this type of infection.  Your sexual activity has changed since you were last screened and you are at an increased risk for chlamydia or gonorrhea. Ask your health care provider if you are at risk.  If you do not have HIV, but are at risk, it may be recommended that you take a prescription medicine daily to prevent HIV infection. This is called pre-exposure prophylaxis (PrEP). You are considered at risk if:  You are sexually active and do not regularly use condoms or know the HIV status of your partner(s).  You take drugs by injection.  You are sexually active with a partner who has HIV. Talk with your health care provider about whether you are at high risk of being infected with HIV. If you choose to begin PrEP, you should first be tested for HIV. You should then be tested every 3 months for as long as you are taking PrEP. Pregnancy  If you are premenopausal and you may become pregnant, ask your health care provider about preconception counseling.  If you may become pregnant, take 400 to 800 micrograms (mcg) of folic acid every day.  If you want to prevent pregnancy, talk to your health care provider about birth control (contraception). Osteoporosis and menopause  Osteoporosis is a  disease in which the bones lose minerals and strength with aging. This can result in serious bone fractures. Your risk for osteoporosis can be identified using a bone density scan.  If you are 26 years of age or older, or if you are at risk for osteoporosis and fractures, ask your health care provider if you should be screened.  Ask your health care provider whether you should take a calcium or vitamin D supplement to lower your risk for osteoporosis.  Menopause may have certain physical symptoms and risks.  Hormone replacement therapy may reduce some of these symptoms and risks. Talk to your health care provider about whether hormone replacement therapy is right for you. Follow these instructions at home:  Schedule regular health, dental, and eye exams.  Stay current with your immunizations.  Do not use any tobacco products including cigarettes, chewing tobacco, or electronic  cigarettes.  If you are pregnant, do not drink alcohol.  If you are breastfeeding, limit how much and how often you drink alcohol.  Limit alcohol intake to no more than 1 drink per day for nonpregnant women. One drink equals 12 ounces of beer, 5 ounces of wine, or 1 ounces of hard liquor.  Do not use street drugs.  Do not share needles.  Ask your health care provider for help if you need support or information about quitting drugs.  Tell your health care provider if you often feel depressed.  Tell your health care provider if you have ever been abused or do not feel safe at home. This information is not intended to replace advice given to you by your health care provider. Make sure you discuss any questions you have with your health care provider. Document Released: 05/08/2011 Document Revised: 03/30/2016 Document Reviewed: 07/27/2015  2017 [employer]

## 2016-12-08 ENCOUNTER — Encounter: Payer: Self-pay | Admitting: *Deleted

## 2016-12-08 NOTE — Progress Notes (Signed)
I have reviewed this visit and discussed with Lauren Ducatte, RN, BSN, and agree with her documentation.   

## 2016-12-14 ENCOUNTER — Ambulatory Visit (INDEPENDENT_AMBULATORY_CARE_PROVIDER_SITE_OTHER): Payer: Federal, State, Local not specified - PPO | Admitting: Family Medicine

## 2016-12-14 ENCOUNTER — Encounter: Payer: Self-pay | Admitting: Family Medicine

## 2016-12-14 VITALS — BP 130/84 | HR 50 | Temp 97.5°F | Ht 64.0 in | Wt 238.8 lb

## 2016-12-14 DIAGNOSIS — F3181 Bipolar II disorder: Secondary | ICD-10-CM

## 2016-12-14 DIAGNOSIS — E7800 Pure hypercholesterolemia, unspecified: Secondary | ICD-10-CM | POA: Diagnosis not present

## 2016-12-14 DIAGNOSIS — Z79899 Other long term (current) drug therapy: Secondary | ICD-10-CM | POA: Insufficient documentation

## 2016-12-14 HISTORY — DX: Other long term (current) drug therapy: Z79.899

## 2016-12-14 LAB — LIPID PANEL
CHOLESTEROL: 206 mg/dL — AB (ref <200)
HDL: 48 mg/dL — ABNORMAL LOW (ref >50)
LDL CALC: 113 mg/dL — AB (ref <100)
TRIGLYCERIDES: 224 mg/dL — AB (ref <150)
Total CHOL/HDL Ratio: 4.3 Ratio (ref <5.0)
VLDL: 45 mg/dL — ABNORMAL HIGH (ref <30)

## 2016-12-14 MED ORDER — TOPIRAMATE 25 MG PO CPSP: ORAL_CAPSULE | ORAL | 1 refills | Status: DC

## 2016-12-14 NOTE — Patient Instructions (Signed)
Trial of Topiramate (Topamax) to see if weight is going down and you are tolerating this new medication.

## 2016-12-15 ENCOUNTER — Encounter: Payer: Self-pay | Admitting: Family Medicine

## 2016-12-18 ENCOUNTER — Encounter: Payer: Self-pay | Admitting: Family Medicine

## 2016-12-18 NOTE — Assessment & Plan Note (Signed)
Established problem that has improved.  Given success with diet and exercise, will try trial of topamax to help with weight loss up to 50 mg twice a day.  Will need BMET monitor next ov in 4 weeks.

## 2016-12-18 NOTE — Progress Notes (Signed)
   Subjective:    Patient ID: TENIELLE AGUINO, female    DOB: [birth year], 66 y.o.   MRN: [id number] DENIAH TABET is alone Sources of clinical information for visit is/are patient and past medical records. Nursing assessment for this office visit was reviewed with the patient for accuracy and revision.   HPI  Obesity - Has been successful in losing some weight.   - Is attending Gym three times a week - Taking OTC caffeine supplement for weight loss (Bioex-4) - Having headaches since starting this OTC.  Some nausea after taking oTC  Bipolar Two - New diagnosis for patient from Dr Reece Levy (Psych) - Dr Reece Levy does not want her to reduce her Lorazepam any further - No change in Psych medication  SH: Two year anniversary of quitting smoking.  Review of Systems No change vision No agitation   PHQ9 = 5  Objective:   Physical Exam VS reviewed Gen: A&Ox4 Neuro: normal gait Psych: Normal speech/language/affect/thought content       Assessment & Plan:

## 2016-12-18 NOTE — Assessment & Plan Note (Signed)
Established problem. Stable. Continue current therapy  

## 2016-12-25 ENCOUNTER — Ambulatory Visit (INDEPENDENT_AMBULATORY_CARE_PROVIDER_SITE_OTHER): Payer: Federal, State, Local not specified - PPO | Admitting: Family Medicine

## 2016-12-25 ENCOUNTER — Encounter: Payer: Self-pay | Admitting: Family Medicine

## 2016-12-25 DIAGNOSIS — J069 Acute upper respiratory infection, unspecified: Secondary | ICD-10-CM

## 2016-12-25 DIAGNOSIS — B9789 Other viral agents as the cause of diseases classified elsewhere: Secondary | ICD-10-CM

## 2016-12-25 MED ORDER — AZITHROMYCIN 250 MG PO TABS: ORAL_TABLET | ORAL | 0 refills | Status: AC

## 2016-12-25 NOTE — Assessment & Plan Note (Signed)
Acute. Has COPD. Likely viral URO w/ concerns for bacterial component. No hypoxia or increased WOB, 97% RA. Afebrile w/o tachycardia or tachypnea. --Azithro 5-day pack (on citalopram, no prolonged QTc on EKG by hx) --Will hold on steroid burst given allergy (pt endorses severe depression w/ use) --Does not need albuterol inhaler refill --Discussed specific red flags which pt is to return of seek medical attention --RTC in 2 weeks

## 2016-12-25 NOTE — Progress Notes (Signed)
   Subjective:   Patient ID: [name]    DOB: 10/15/1951, 66 y.o. female   MRN: [id number]  CC: "cough"  HPI: [name] is a 66 y.o. female who presents to clinic today for SDA concerning a cough. Problems discussed today are as follows:  Cough: Pt w/ productive cough for 1 week duration. Sputum is "Dijon mustard" in appearance. Associated symptoms include sore throat, sinus congestion. Denies h/o fevers, chills, CP, SOB, orthopnea, PND, LE edema, hemoptysis, N/V/D, muscle or joint aches. Has tried Mucinex with some relief. Former smoker with COPD diagnosis. Has SABA inhaler but not using given w/o SOB or wheeze.  ROS: complete ROS performed, see HPI for pertinent ROS.  [place]: HTN, COPD, bipolar type 2, obesity, hypothyroidism, allergic rhinitis. Smoking status reviewed. Medications reviewed.  Objective:   BP 124/68   Pulse 65   Temp 98.8 F (37.1 C) (Oral)   Ht 5\' 4"  (1.626 m)   Wt 236 lb (107 kg)   SpO2 97%   BMI 40.51 kg/m  Vitals and nursing note reviewed.  General: obese, well nourished, well developed, in no acute distress with non-toxic appearance HEENT: normocephalic, atraumatic, moist mucous membranes, PERRLA, EOMI, pink conjunctiva, erythematous posterior pharynx w/o tonsillar exudate, no rhinorrhea, minimal maxillary tenderness b/l Neck: supple, non-tender without lymphadenopathy CV: regular rate and rhythm without murmurs, rubs, or gallops, no lower extremity edema Lungs: clear to auscultation bilaterally with normal work of breathing Abdomen: soft, non-tender, non-distended, no masses or organomegaly palpable, normoactive bowel sounds Skin: warm, dry, no rashes or lesions, cap refill < 2 seconds Extremities: warm and well perfused, normal tone  Assessment & Plan:   URI (upper respiratory infection) Acute. Has COPD. Likely viral URO w/ concerns for bacterial component. No hypoxia or increased WOB, 97% RA. Afebrile w/o tachycardia or tachypnea. --Azithro  5-day pack (on citalopram, no prolonged QTc on EKG by hx) --Will hold on steroid burst given allergy (pt endorses severe depression w/ use) --Does not need albuterol inhaler refill --Discussed specific red flags which pt is to return of seek medical attention --RTC in 2 weeks  No orders of the defined types were placed in this encounter.  Meds ordered this encounter  Medications  . azithromycin (ZITHROMAX) 250 MG tablet    Sig: Take 2 tabs by mouth on day 1, then 1 tab by mouth daily    Dispense:  6 each    Refill:  0    Harriet Butte, DO [place], PGY-1 12/25/2016 5:01 PM

## 2016-12-25 NOTE — Patient Instructions (Signed)
It was a pleasure to meet you today. Please see below to review our plan for today's visit.  1. Your symptoms are consistent with an upper respiratory infection. This is almost always caused by a virus. You may have a bacterial infection caused by this virus, and given your COPD history, I would like you to take an antibiotic called azithromycin (see information below). You will take 500 mg the first day followed by 250 mg daily for 4 days. 2. Return to clinic in 2 weeks. If you begin to get short of breath, have worsening symptoms, develop a fever greater than 100.69F, please seek medical attention.  Please call the clinic at [phone] if your symptoms worsen or you have any concerns. It was my pleasure to see you. -- Harriet Butte, [place], PGY-1  Azithromycin tablets What is this medicine? AZITHROMYCIN (az ith roe MYE sin) is a macrolide antibiotic. It is used to treat or prevent certain kinds of bacterial infections. It will not work for colds, flu, or other viral infections. This medicine may be used for other purposes; ask your health care provider or pharmacist if you have questions. COMMON BRAND NAME(S): Zithromax, Zithromax Tri-Pak, Zithromax Z-Pak What should I tell my health care provider before I take this medicine? They need to know if you have any of these conditions: -kidney disease -liver disease -irregular heartbeat or heart disease -an unusual or allergic reaction to azithromycin, erythromycin, other macrolide antibiotics, foods, dyes, or preservatives -pregnant or trying to get pregnant -breast-feeding How should I use this medicine? Take this medicine by mouth with a full glass of water. Follow the directions on the prescription label. The tablets can be taken with food or on an empty stomach. If the medicine upsets your stomach, take it with food. Take your medicine at regular intervals. Do not take your medicine more often than directed. Take  all of your medicine as directed even if you think your are better. Do not skip doses or stop your medicine early. Talk to your pediatrician regarding the use of this medicine in children. While this drug may be prescribed for children as young as 6 months for selected conditions, precautions do apply. Overdosage: If you think you have taken too much of this medicine contact a poison control center or emergency room at once. NOTE: This medicine is only for you. Do not share this medicine with others. What if I miss a dose? If you miss a dose, take it as soon as you can. If it is almost time for your next dose, take only that dose. Do not take double or extra doses. What may interact with this medicine? Do not take this medicine with any of the following medications: -lincomycin This medicine may also interact with the following medications: -amiodarone -antacids -birth control pills -cyclosporine -digoxin -magnesium -nelfinavir -phenytoin -warfarin This list may not describe all possible interactions. Give your health care provider a list of all the medicines, herbs, non-prescription drugs, or dietary supplements you use. Also tell them if you smoke, drink alcohol, or use illegal drugs. Some items may interact with your medicine. What should I watch for while using this medicine? Tell your doctor or healthcare professional if your symptoms do not start to get better or if they get worse. Do not treat diarrhea with over the counter products. Contact your doctor if you have diarrhea that lasts more than 2 days or if it is severe and watery. This medicine can make  you more sensitive to the sun. Keep out of the sun. If you cannot avoid being in the sun, wear protective clothing and use sunscreen. Do not use sun lamps or tanning beds/booths. What side effects may I notice from receiving this medicine? Side effects that you should report to your doctor or health care professional as soon as  possible: -allergic reactions like skin rash, itching or hives, swelling of the face, lips, or tongue -confusion, nightmares or hallucinations -dark urine -difficulty breathing -hearing loss -irregular heartbeat or chest pain -pain or difficulty passing urine -redness, blistering, peeling or loosening of the skin, including inside the mouth -white patches or sores in the mouth -yellowing of the eyes or skin Side effects that usually do not require medical attention (report to your doctor or health care professional if they continue or are bothersome): -diarrhea -dizziness, drowsiness -headache -stomach upset or vomiting -tooth discoloration -vaginal irritation This list may not describe all possible side effects. Call your doctor for medical advice about side effects. You may report side effects to FDA at 1-800-FDA-1088. Where should I keep my medicine? Keep out of the reach of children. Store at room temperature between 15 and 30 degrees C (59 and 86 degrees F). Throw away any unused medicine after the expiration date. NOTE: This sheet is a summary. It may not cover all possible information. If you have questions about this medicine, talk to your doctor, pharmacist, or health care provider.  2017 Elsevier/Gold Standard (2015-12-21 15:26:03)

## 2016-12-26 ENCOUNTER — Ambulatory Visit: Payer: Federal, State, Local not specified - PPO | Admitting: Family Medicine

## 2016-12-27 ENCOUNTER — Other Ambulatory Visit: Payer: Self-pay | Admitting: Family Medicine

## 2017-01-03 MED ORDER — TOPIRAMATE 25 MG PO CPSP: ORAL_CAPSULE | ORAL | 1 refills | Status: DC

## 2017-01-03 NOTE — Telephone Encounter (Signed)
Need refill on topramax.  She has enough to last her until next Thurs. She has an appt with Dr McDiarmind March 29.  Please advise

## 2017-01-07 ENCOUNTER — Other Ambulatory Visit: Payer: Self-pay | Admitting: Family Medicine

## 2017-01-08 ENCOUNTER — Other Ambulatory Visit: Payer: Self-pay | Admitting: Family Medicine

## 2017-02-01 ENCOUNTER — Ambulatory Visit (INDEPENDENT_AMBULATORY_CARE_PROVIDER_SITE_OTHER): Payer: Federal, State, Local not specified - PPO | Admitting: Family Medicine

## 2017-02-01 VITALS — BP 122/60 | HR 60 | Temp 98.2°F | Wt 229.6 lb

## 2017-02-01 DIAGNOSIS — G2581 Restless legs syndrome: Secondary | ICD-10-CM

## 2017-02-01 DIAGNOSIS — Z79899 Other long term (current) drug therapy: Secondary | ICD-10-CM

## 2017-02-01 DIAGNOSIS — G894 Chronic pain syndrome: Secondary | ICD-10-CM

## 2017-02-01 MED ORDER — TOPIRAMATE 25 MG PO CPSP: ORAL_CAPSULE | ORAL | 1 refills | Status: DC

## 2017-02-01 NOTE — Progress Notes (Signed)
   Subjective:    Patient ID: [name], female    DOB: Mar 26, 1951, 66 y.o.   MRN: [id number] [name] is alone Sources of clinical information for visit is/are patient, past medical records and notes [facility] brought by patient. Nursing assessment for this office visit was reviewed with the patient for accuracy and revision.   HPI  Chronic Back Pain - Longstanding - Currently opioid tx and injection tx managed by [facility], Dr Andree Elk - patient pain currently under adequate control with Gabriella Contin 15 mg BID and Norco 5/325 1 tab three times a day prn. - Patient recent facet joint injection at [facility].  She reports increased pain with facet injection and persistent increased back pain since injection.  She does not desire to have another injection. - [name] is having difficulty affording the copay for visits to [facility] and asks that [facility] resume prescribing her opioid therapy. - Review of [place] CSRS showed only monthly prescriptions for Gabriella Contin 15 mg #60 and Norco 5/325 # 68 from [facility]   Morbid Obesity - 8 weeks of topiramate therapy for weight loss - no change vision, no nausea/vomiting, no fatigue, no diarrhea - Has not been eating as much but still eating three meals a day of varied foods - More active since weight loss but not participating in formal exercise   Smoking status reviewed. Pt remains abstinent. Does smoke marijuana  Review of Systems No fever/chills No confusion No falls/stumbles    Objective:   Physical Exam VS reviewed GEN: Alert, Cooperative, Groomed, NAD HEENT: PERRL; EAC bilaterally not occluded, TM's translucent with normal LM, (+) LR;                No cervical LAN, No thyromegaly, No palpable masses COR: RRR, No M/G/R, No JVD, Normal PMI size and location LUNGS: BCTA, No Acc mm use, speaking in full sentences Neuro: normal finger to nose, negative rhomberg Gait: Normal speed, No significant path deviation, Step  through +,  Psych: Normal affect/thought/speech/language    Assessment & Plan:

## 2017-02-01 NOTE — Patient Instructions (Addendum)
I will resume the prescription and monitoring of your opiate therapy starting in the first to second week of April.   Will discuss terms of prescribing opiates at the next office visit.

## 2017-02-02 ENCOUNTER — Encounter: Payer: Self-pay | Admitting: Family Medicine

## 2017-02-02 NOTE — Assessment & Plan Note (Signed)
Established problem Controlled currently with two norco tab at bedtime I agreed to resume managing patients prescription for Norco 5/325.  We agreed to prescription of two tablets daily at bedtime for back pain and RLS control.  Will resume prescription starting 02/13/17.  Pt encouraged to call [facility] several days in advance of 4/10 to request Rx.  Will fill monthly for now.

## 2017-02-02 NOTE — Assessment & Plan Note (Signed)
Established problem. Stable. I agreed to resume management of patients MS Contin 15 mg BId and Norco 5/325, two tablets at bedtime. Will start management at end of current opiate therapy from [facility] on 02/14/16.  Pt to call several days in advance of 02/14/15 for start of filling opiate therapy by Dr Alicea Wente. Will need contract for controlled substance completed next visit. Reviewed on [place] CSRS for pt showed no evidence of doctor shopping.

## 2017-02-02 NOTE — Assessment & Plan Note (Signed)
Established problem that has improved.  Loss of 9 pounds since start of Topomax therapy 8 weeks ago. Pt tolerating topiramate. Will check BMET next visit in 4 weeks to access for NAG met acidosis. Continue tx for now

## 2017-02-09 ENCOUNTER — Encounter: Payer: Self-pay | Admitting: Family Medicine

## 2017-02-12 ENCOUNTER — Other Ambulatory Visit: Payer: Self-pay | Admitting: Family Medicine

## 2017-02-12 MED ORDER — MORPHINE SULFATE ER 15 MG PO TBCR: 15.0000 mg | EXTENDED_RELEASE_TABLET | Freq: Two times a day (BID) | ORAL | 0 refills | Status: DC

## 2017-02-12 MED ORDER — HYDROCODONE-ACETAMINOPHEN 5-325 MG PO TABS: 2.0000 | ORAL_TABLET | Freq: Every day | ORAL | 0 refills | Status: DC

## 2017-02-12 NOTE — Telephone Encounter (Signed)
Will forward to MD to advise. Edi Gorniak,CMA  

## 2017-02-12 NOTE — Telephone Encounter (Signed)
Pt called and needs refills on her pain medication either left up front or called in. She needs refills on her Hydrocodone and Morphine. jw

## 2017-02-12 NOTE — Telephone Encounter (Signed)
-----   Message from Blane Ohara McDiarmid, MD sent at 02/12/2017 10:15 AM EDT ----- Regarding: Call to pick up prescription Please let patient know her prescription(s) are available for pick up from the [facility] front desk.

## 2017-02-12 NOTE — Telephone Encounter (Signed)
Patient is aware that scripts will be ready for pick up after lunch. Dallas Torok,CMA

## 2017-03-12 ENCOUNTER — Other Ambulatory Visit: Payer: Self-pay | Admitting: Family Medicine

## 2017-03-12 NOTE — Telephone Encounter (Signed)
Pt needs refills on morphine and hydrocodone. ep

## 2017-03-13 MED ORDER — MORPHINE SULFATE ER 15 MG PO TBCR: 15.0000 mg | EXTENDED_RELEASE_TABLET | Freq: Two times a day (BID) | ORAL | 0 refills | Status: DC

## 2017-03-13 MED ORDER — HYDROCODONE-ACETAMINOPHEN 5-325 MG PO TABS: 2.0000 | ORAL_TABLET | Freq: Every day | ORAL | 0 refills | Status: DC

## 2017-03-13 NOTE — Telephone Encounter (Signed)
Printed and Signed Rx for hydrocodone and morphine and given to [employer]

## 2017-03-13 NOTE — Telephone Encounter (Signed)
Patient is aware that scripts are ready for pick up.  [name],CMA

## 2017-03-22 ENCOUNTER — Encounter: Payer: Self-pay | Admitting: Family Medicine

## 2017-03-22 ENCOUNTER — Ambulatory Visit (INDEPENDENT_AMBULATORY_CARE_PROVIDER_SITE_OTHER): Payer: Federal, State, Local not specified - PPO | Admitting: Family Medicine

## 2017-03-22 VITALS — BP 128/64 | HR 61 | Temp 98.0°F | Ht 64.0 in | Wt 222.0 lb

## 2017-03-22 DIAGNOSIS — E039 Hypothyroidism, unspecified: Secondary | ICD-10-CM | POA: Diagnosis not present

## 2017-03-22 DIAGNOSIS — G8929 Other chronic pain: Secondary | ICD-10-CM

## 2017-03-22 DIAGNOSIS — I10 Essential (primary) hypertension: Secondary | ICD-10-CM

## 2017-03-22 DIAGNOSIS — Z79899 Other long term (current) drug therapy: Secondary | ICD-10-CM

## 2017-03-22 DIAGNOSIS — G894 Chronic pain syndrome: Secondary | ICD-10-CM | POA: Diagnosis not present

## 2017-03-22 DIAGNOSIS — G2581 Restless legs syndrome: Secondary | ICD-10-CM | POA: Diagnosis not present

## 2017-03-22 DIAGNOSIS — F3181 Bipolar II disorder: Secondary | ICD-10-CM | POA: Diagnosis not present

## 2017-03-22 DIAGNOSIS — I700 Atherosclerosis of aorta: Secondary | ICD-10-CM

## 2017-03-22 MED ORDER — TIZANIDINE HCL 2 MG PO TABS: 2.0000 mg | ORAL_TABLET | Freq: Two times a day (BID) | ORAL | 5 refills | Status: DC | PRN

## 2017-03-22 MED ORDER — MORPHINE SULFATE ER 15 MG PO TBCR: 15.0000 mg | EXTENDED_RELEASE_TABLET | Freq: Two times a day (BID) | ORAL | 0 refills | Status: DC

## 2017-03-22 MED ORDER — HYDROCODONE-ACETAMINOPHEN 5-325 MG PO TABS: 2.0000 | ORAL_TABLET | Freq: Every day | ORAL | 0 refills | Status: DC

## 2017-03-22 NOTE — Patient Instructions (Signed)
Dr Benuel Ly will call you if your tests are not good. Otherwise he will send you a letter.  If you sign up for MyChart online, you will be able to see your test results once Dr Zayden Maffei has reviewed them.  If you do not hear from us with in 2 weeks please call our office   

## 2017-03-23 LAB — BASIC METABOLIC PANEL
BUN / CREAT RATIO: 16 (ref 12–28)
BUN: 13 mg/dL (ref 8–27)
CALCIUM: 9.4 mg/dL (ref 8.7–10.3)
CO2: 24 mmol/L (ref 18–29)
Chloride: 97 mmol/L (ref 96–106)
Creatinine, Ser: 0.83 mg/dL (ref 0.57–1.00)
GFR, EST AFRICAN AMERICAN: 85 mL/min/{1.73_m2} (ref >59)
GFR, EST NON AFRICAN AMERICAN: 74 mL/min/{1.73_m2} (ref >59)
Glucose: 90 mg/dL (ref 65–99)
Potassium: 4.7 mmol/L (ref 3.5–5.2)
Sodium: 135 mmol/L (ref 134–144)

## 2017-03-26 ENCOUNTER — Encounter: Payer: Self-pay | Admitting: Family Medicine

## 2017-03-26 MED ORDER — LEVOTHYROXINE SODIUM 50 MCG PO TABS: ORAL_TABLET | ORAL | 3 refills | Status: DC

## 2017-03-26 NOTE — Assessment & Plan Note (Signed)
Wt Readings from Last 3 Encounters:  03/22/17 222 lb (100.7 kg)  02/01/17 229 lb 9.6 oz (104.1 kg)  12/25/16 236 lb (107 kg)   Weight down 42 pounds since 08/2017 office visit Tolerating Topamax. Check of serum bicarbonate todayshowed no evidence of NAG metabolic Acidosis.  Plan continue topamax as long as weight continues to decline with intneitional dieting and pt tolerates medication.

## 2017-03-26 NOTE — Assessment & Plan Note (Signed)
Established problem Controlled currently with two norco tab at bedtime   We agreed to prescription of two tablets daily at bedtime for back pain and RLS control.  Review of [place] CSRS shiows no doctor shopping.  Rx given for hydrocodone #60 fill on or after 04/11/17.

## 2017-03-26 NOTE — Assessment & Plan Note (Signed)
Established problem Controlled Pt to continue current psychotropic regiment.  Pt to see Dr Reece Levy (Psych) in autumn.

## 2017-03-26 NOTE — Assessment & Plan Note (Signed)
Adequate blood pressure control.  No evidence of new end organ damage.  Tolerating medication without significant adverse effects.  Plan to continue current blood pressure regiment.   

## 2017-03-26 NOTE — Assessment & Plan Note (Signed)
Lab Results  Component Value Date   TSH 0.80 08/17/2016  Will check level next ov Continue LT4 150 mcg daily

## 2017-03-26 NOTE — Progress Notes (Signed)
   Subjective:    Patient ID: [name], female    DOB: 05-07-1951, 66 y.o.   MRN: [id number] [name] is alone Sources of clinical information for visit is/are patient, past medical records and notes from [facility] brought by patient. Nursing assessment for this office visit was reviewed with the patient for accuracy and revision.   HPI  Chronic Back Pain - Longstanding - Our practice assumed care from [facility] in April 2018.Ms [name] was having difficulty affording the copay for visits to [facility] and asked that [facility] resume prescribing her opioid therapy.     - patient pain currently under adequate control with MS Contin 15 mg BID and Norco 5/325 2 tab at bedtime for pain and RLS sensation - - Review of [place] CSRS showed only monthly prescriptions for MS Contin and Norco only from our practice.  - Tizanidie twice a day helps with pain in back  Morbid Obesity - 10 weeks of topiramate therapy for weight loss - no change vision, no nausea/vomiting, no fatigue, no diarrhea - Has not been eating as much but still eating three meals a day of varied foods - More active since weight loss but not participating in formal exercise. Taking more outdoors daily  Smoking status reviewed. Pt remains abstinent. Does smoke marijuana  Hypothyroidism Longstanding issue for patient Patient presents for evaluation of thyroid function.  Symptoms  denies fatigue, weight changes, heat/cold intolerance, bowel/skin changes or CVS symptoms.     The problem has been unchanged.   Previous thyroid studies include TSH.  The hypothyroidism is due to presumed autoimmune thyroid condition. Taking LT4 150 mcg daily  CHRONIC HYPERTENSION  Disease Monitoring  Blood pressure range: not checking at home  Chest pain: no   Dyspnea: no   Claudication: no   Medication compliance: yes  Medication Side Effects  Lightheadedness: no   Urinary frequency: no   Edema: no     Preventitive Healthcare:  Exercise: no   Diet Pattern: eating more vegetable serving daily  Salt Restriction: no  SH: Not smoking tobacco   Review of Systems No fever/chills No confusion No falls/stumbles    Objective:   Physical Exam VS reviewed GEN: Alert, Cooperative, Groomed, NAD HEENT: PERRL; EAC bilaterally not occluded, TM's translucent with normal LM, (+) LR;                No cervical LAN, No thyromegaly, No palpable masses COR: RRR, No M/G/R, No JVD, Normal PMI size and location LUNGS: BCTA, No Acc mm use, speaking in full sentences Neuro: normal finger to nose, negative rhomberg Gait: Normal speed, No significant path deviation, Step through +,  Psych: Normal affect/thought/speech/language    Assessment & Plan:  See problem list

## 2017-03-26 NOTE — Assessment & Plan Note (Signed)
Established problem. Stable. Continue current therapy for CV risk reduction

## 2017-03-26 NOTE — Assessment & Plan Note (Signed)
Established problem. Stable. Our practice prescribing patient MS Contin 15 mg tablet, one tablet every 12 hours Review of [place] CSRS for pt showed no evidence of doctor shopping. Rx for MS Contin 15 mg #60 tab to fill on or after 04/11/17

## 2017-04-02 ENCOUNTER — Other Ambulatory Visit: Payer: Self-pay | Admitting: Family Medicine

## 2017-04-08 ENCOUNTER — Other Ambulatory Visit: Payer: Self-pay | Admitting: Family Medicine

## 2017-05-10 ENCOUNTER — Other Ambulatory Visit: Payer: Self-pay | Admitting: Family Medicine

## 2017-05-10 NOTE — Telephone Encounter (Signed)
Pt is calling because she will be out of pain medication on July 9th and her appointment is on July 12 th. Can we write enough medication to see her through until her appointment? Please call patient and let her know since she would like to come and get this tomorrow. jw

## 2017-05-11 ENCOUNTER — Other Ambulatory Visit: Payer: Self-pay | Admitting: Family Medicine

## 2017-05-11 MED ORDER — HYDROCODONE-ACETAMINOPHEN 5-325 MG PO TABS: 2.0000 | ORAL_TABLET | Freq: Every day | ORAL | 0 refills | Status: DC

## 2017-05-11 NOTE — Telephone Encounter (Signed)
Has been routed to the provider.

## 2017-05-11 NOTE — Telephone Encounter (Signed)
Pt called to check the status of her request for her pain medication. Pt will be out and about today and would like to come and pick up. jw

## 2017-05-14 DIAGNOSIS — F40248 Other situational type phobia: Secondary | ICD-10-CM | POA: Diagnosis not present

## 2017-05-14 DIAGNOSIS — F319 Bipolar disorder, unspecified: Secondary | ICD-10-CM | POA: Diagnosis not present

## 2017-05-14 DIAGNOSIS — F332 Major depressive disorder, recurrent severe without psychotic features: Secondary | ICD-10-CM | POA: Diagnosis not present

## 2017-05-14 DIAGNOSIS — F4000 Agoraphobia, unspecified: Secondary | ICD-10-CM | POA: Diagnosis not present

## 2017-05-15 ENCOUNTER — Other Ambulatory Visit: Payer: Self-pay | Admitting: Family Medicine

## 2017-05-17 ENCOUNTER — Encounter: Payer: Self-pay | Admitting: Family Medicine

## 2017-05-17 ENCOUNTER — Ambulatory Visit (INDEPENDENT_AMBULATORY_CARE_PROVIDER_SITE_OTHER): Payer: Medicare Other | Admitting: Family Medicine

## 2017-05-17 VITALS — BP 112/64 | HR 49 | Temp 98.2°F | Wt 230.0 lb

## 2017-05-17 DIAGNOSIS — G2581 Restless legs syndrome: Secondary | ICD-10-CM | POA: Diagnosis not present

## 2017-05-17 DIAGNOSIS — M160 Bilateral primary osteoarthritis of hip: Secondary | ICD-10-CM | POA: Diagnosis present

## 2017-05-17 DIAGNOSIS — E66813 Obesity, class 3: Secondary | ICD-10-CM

## 2017-05-17 DIAGNOSIS — M48061 Spinal stenosis, lumbar region without neurogenic claudication: Secondary | ICD-10-CM | POA: Diagnosis not present

## 2017-05-17 DIAGNOSIS — J4489 Other specified chronic obstructive pulmonary disease: Secondary | ICD-10-CM

## 2017-05-17 DIAGNOSIS — G894 Chronic pain syndrome: Secondary | ICD-10-CM | POA: Diagnosis not present

## 2017-05-17 DIAGNOSIS — J449 Chronic obstructive pulmonary disease, unspecified: Secondary | ICD-10-CM | POA: Diagnosis not present

## 2017-05-17 DIAGNOSIS — M47896 Other spondylosis, lumbar region: Secondary | ICD-10-CM | POA: Diagnosis not present

## 2017-05-17 DIAGNOSIS — G8929 Other chronic pain: Secondary | ICD-10-CM | POA: Diagnosis not present

## 2017-05-17 MED ORDER — MORPHINE SULFATE ER 15 MG PO TBCR: 15.0000 mg | EXTENDED_RELEASE_TABLET | Freq: Two times a day (BID) | ORAL | 0 refills | Status: DC

## 2017-05-17 MED ORDER — PHENTERMINE HCL 37.5 MG PO CAPS: 37.5000 mg | ORAL_CAPSULE | ORAL | 0 refills | Status: DC

## 2017-05-17 MED ORDER — HYDROCODONE-ACETAMINOPHEN 5-325 MG PO TABS: 2.0000 | ORAL_TABLET | Freq: Every day | ORAL | 0 refills | Status: DC

## 2017-05-17 NOTE — Assessment & Plan Note (Signed)
Established problem worsened.  Restart MS Contin 15 mg every twelve hours. Three monthly printed RXs provided to patient [name] reviewed. No evidence of doctor shopping.

## 2017-05-17 NOTE — Assessment & Plan Note (Signed)
Updated. Needs opioid urine test next office visit.

## 2017-05-17 NOTE — Assessment & Plan Note (Signed)
Established problem worsened.  Restart Gabriella Contin 15 mg every twelve hours. 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  [place] chair use will help relieve stress and pain on hips and low back.  [place] reviewed. No evidence of doctor shopping.

## 2017-05-17 NOTE — Progress Notes (Signed)
Subjective:    Patient ID: [name], female    DOB: Dec 20, 1950, 66 y.o.   MRN: [id number] [name] is alone Sources of clinical information for visit is/are patient and past medical records. Nursing assessment for this office visit was reviewed with the patient for accuracy and revision.   HPI  Problem List Items Addressed This Visit      High   Obesity, Class III, BMI 40-49.9 (morbid obesity) ([place]) - Primary (Chronic) - Recent return of increased weight - Eating larger quantity and lower quality of foods. - Stopped taking OTC caffeinated weight loss pill - Taking Topamax 50 mg BID, no dizziness, no sedation, no rash     Medium   RESTLESS LEGS SYNDROME (Chronic) - [place]stad[place] issue - Responds to Norco 5/325 mg tablet, two tablets qhs prn.  - Only able to go one night in last month without having to take the Norco for restless legs feelings interfering with sleep onset.  - (+) constipation.  No falls.    Chronic pain syndrome (Chronic) - Longstanding - Previous [facility] with [name] prescription of opioids from [facility] pain clinic.  - Adequate analgesia.  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  - She had to decrease her MS Contin to once a day because she was unable to get into she [name] in time to get refill.  Her pain increased significantly with this reduction in daily dose.  She became less active and began to gain wieght.  - No confusion.  No dry mouth. (+) constipation     Low   COPD WITHOUT EXACERBATION (Chronic) - [place] - stable.  - Has not had to use albuterol MDI in several months. - No cough, no increase sputum.  No wheezing.      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  Relieved in past by allowing her to use a stool chair for her use while  working register.  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   Relevant Medications   morphine (MS CONTIN) 15 MG 12 hr tablet   HYDROcodone-acetaminophen (NORCO/VICODIN) 5-325 MG tablet   HYDROcodone-acetaminophen (NORCO) 5-325 MG tablet   HYDROcodone-acetaminophen (NORCO) 5-325 MG tablet   morphine (MS CONTIN) 15 MG 12 hr tablet   morphine (MS CONTIN) 15 MG 12 hr tablet   Encounter for chronic pain management (Chronic)   Spinal stenosis of lumbar region (Chronic) - Worsened with reduction in MS Contin dose to once a day - Pain worsens with prolonged standing  at [employer] at work.  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       SH: Former Smoker Review of Systems  See HPI No radicular pain No fever No falls No mania      Objective:   Physical Exam  Depression screen PHQ 2/9 05/17/2017  Decreased Interest 1  Down, Depressed, Hopeless 0  PHQ - 2 Score 1   Vitals:   05/17/17 1150  BP: 112/64  Pulse: (!) 49  Temp: 98.2 F (36.8 C)   Wt Readings from Last 3 Encounters:  05/17/17 230 lb (104.3 kg)  03/22/17 222 lb (100.7  kg)  02/01/17 229 lb 9.6 oz (104.1 kg)   VS reviewed GEN: Alert, Cooperative, Groomed, NAD Gait: slower gait speed, No significant path deviation, Step through +, slight atalgic gait Psych: Normal affect/thought/speech/language    Assessment & Plan:  See problem list

## 2017-05-17 NOTE — Assessment & Plan Note (Signed)
Established problem worsened.  After discussion of risks of increased blood pressure, precipitation of mania, increase risk of ischemic events including MI and stoke, [name] elected to try a short course (up to 12 weeks) of adding phentermine 37.5 mg daily to her current topiramate 50 mg BID with goal of a 5 to 7 percent body weight reduction in 3 months.   First printed Rx for phentermine was for 2 weeks only in order to ensure patient can tolerate the medication. Pt to RTC in 2 weeks to assess tolerance to medication and consideration of continuing combination of topiramate and phentermine.  Pt advised to stop medication immediately should mania or other adverse experiences occur.

## 2017-05-17 NOTE — Assessment & Plan Note (Signed)
Established problem. Stable. Continue current therapy Norco 5-325 mg tablet, two tablets at bedtime prn RLS. 3 Printed monthly Rxs for Norco 5-325. First Rx will be filled around 06/11/17.

## 2017-05-17 NOTE — Assessment & Plan Note (Signed)
Established problem worsened.  Restart MS Contin 15 mg every twelve hours. 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  [facility] chair use will help relieve stress and pain on hips and low back.  [place] reviewed. No evidence of doctor shopping.

## 2017-05-17 NOTE — Assessment & Plan Note (Signed)
Established problem. Stable. Continue current therapy of prn albuterol MDI

## 2017-05-17 NOTE — Patient Instructions (Signed)
Start Phentermine in morning one capsule.  Watch for mania or headache or diarrhea. Return to clinic in 2 weeks to assess tolerance of Phentermine.

## 2017-05-19 ENCOUNTER — Encounter: Payer: Self-pay | Admitting: Family Medicine

## 2017-05-21 DIAGNOSIS — F4000 Agoraphobia, unspecified: Secondary | ICD-10-CM | POA: Diagnosis not present

## 2017-05-21 DIAGNOSIS — F319 Bipolar disorder, unspecified: Secondary | ICD-10-CM | POA: Diagnosis not present

## 2017-05-21 DIAGNOSIS — F40248 Other situational type phobia: Secondary | ICD-10-CM | POA: Diagnosis not present

## 2017-05-21 DIAGNOSIS — F332 Major depressive disorder, recurrent severe without psychotic features: Secondary | ICD-10-CM | POA: Diagnosis not present

## 2017-05-28 DIAGNOSIS — F4000 Agoraphobia, unspecified: Secondary | ICD-10-CM | POA: Diagnosis not present

## 2017-05-28 DIAGNOSIS — F332 Major depressive disorder, recurrent severe without psychotic features: Secondary | ICD-10-CM | POA: Diagnosis not present

## 2017-05-28 DIAGNOSIS — F319 Bipolar disorder, unspecified: Secondary | ICD-10-CM | POA: Diagnosis not present

## 2017-05-28 DIAGNOSIS — F40248 Other situational type phobia: Secondary | ICD-10-CM | POA: Diagnosis not present

## 2017-05-31 ENCOUNTER — Encounter: Payer: Self-pay | Admitting: Internal Medicine

## 2017-05-31 ENCOUNTER — Ambulatory Visit (INDEPENDENT_AMBULATORY_CARE_PROVIDER_SITE_OTHER): Payer: Medicare Other | Admitting: Family Medicine

## 2017-05-31 VITALS — BP 100/68 | HR 53 | Temp 98.7°F | Wt 224.0 lb

## 2017-05-31 DIAGNOSIS — M48061 Spinal stenosis, lumbar region without neurogenic claudication: Secondary | ICD-10-CM

## 2017-05-31 DIAGNOSIS — I10 Essential (primary) hypertension: Secondary | ICD-10-CM | POA: Diagnosis not present

## 2017-05-31 DIAGNOSIS — G894 Chronic pain syndrome: Secondary | ICD-10-CM | POA: Diagnosis not present

## 2017-05-31 MED ORDER — TOPIRAMATE 25 MG PO CPSP: 50.0000 mg | ORAL_CAPSULE | Freq: Two times a day (BID) | ORAL | 2 refills | Status: DC

## 2017-05-31 MED ORDER — PHENTERMINE HCL 37.5 MG PO CAPS: 37.5000 mg | ORAL_CAPSULE | ORAL | 0 refills | Status: DC

## 2017-05-31 NOTE — Patient Instructions (Signed)
Keep taking the Phentermine and topamax for 3 months total.

## 2017-06-01 ENCOUNTER — Encounter: Payer: Self-pay | Admitting: Family Medicine

## 2017-06-01 LAB — OPIATE SCREEN, URINE: Opiate Scrn, Ur: POSITIVE ng/mL — AB

## 2017-06-01 NOTE — Assessment & Plan Note (Signed)
Wt Readings from Last 3 Encounters:  05/31/17 224 lb (101.6 kg)  05/17/17 230 lb (104.3 kg)  03/22/17 222 lb (100.7 kg)  Improved. Tolerating Phentermine + Topiramate. Continue current therapy.  Increase physical activity. Stop date for therapy October 12th 2018.  Will need to titrate Topiramate down over 2 to 4 weeks at end of active treatment phase.

## 2017-06-01 NOTE — Progress Notes (Signed)
   Subjective:    Patient ID: [name], female    DOB: 10-06-1951, 66 y.o.   MRN: [id number] [name] is unaccompanied Sources of clinical information for visit is/are patient and past medical records. Nursing assessment for this office visit was reviewed with the patient for accuracy and revision.   HPI  Morbid Obesity - Added Phentermine to topiramate two weeks ago - Initial couple days with feeling of "increased energy" that has since resolved. Mostly decreased appetite for now.   - No racing thought, no irritability, no decrease need for sleep - Patient saw her counselor two days ago who did not think she was behaving differently. -  Hypotension - No syncope, dizziness, chest pain, change in thinking, falls - No change in metorpolol 50 mg twice a day  No smoking  Review of Systems     Objective:   Physical Exam  VS reviewed GEN: Alert, Cooperative, Groomed, NAD, euthymic COR: HR 56 bpm, No M/G/R LUNGS: BCTA, No Acc mm use, speaking in full sentences EXT: No peripheral leg edema. SKIN: No lesion nor rashes of face/trunk/extremities Standing: no dizziness Gait: Normal speed, No significant path deviation, Step through +,  Psych: Normal affect/thought/speech/language     Assessment & Plan:  See problem list

## 2017-06-01 NOTE — Assessment & Plan Note (Signed)
Established problem worsened.  Lower BP less than 110. HR slightly bradycardic.  Asymptomatic.  Recommend decrease of metoprolol 50 mg tablet to half tablet bid.

## 2017-06-04 DIAGNOSIS — F40248 Other situational type phobia: Secondary | ICD-10-CM | POA: Diagnosis not present

## 2017-06-04 DIAGNOSIS — F319 Bipolar disorder, unspecified: Secondary | ICD-10-CM | POA: Diagnosis not present

## 2017-06-04 DIAGNOSIS — F4000 Agoraphobia, unspecified: Secondary | ICD-10-CM | POA: Diagnosis not present

## 2017-06-04 DIAGNOSIS — F332 Major depressive disorder, recurrent severe without psychotic features: Secondary | ICD-10-CM | POA: Diagnosis not present

## 2017-06-11 DIAGNOSIS — F332 Major depressive disorder, recurrent severe without psychotic features: Secondary | ICD-10-CM | POA: Diagnosis not present

## 2017-06-11 DIAGNOSIS — F4000 Agoraphobia, unspecified: Secondary | ICD-10-CM | POA: Diagnosis not present

## 2017-06-11 DIAGNOSIS — F40248 Other situational type phobia: Secondary | ICD-10-CM | POA: Diagnosis not present

## 2017-06-11 DIAGNOSIS — F319 Bipolar disorder, unspecified: Secondary | ICD-10-CM | POA: Diagnosis not present

## 2017-06-14 ENCOUNTER — Other Ambulatory Visit: Payer: Self-pay | Admitting: *Deleted

## 2017-06-18 DIAGNOSIS — F319 Bipolar disorder, unspecified: Secondary | ICD-10-CM | POA: Diagnosis not present

## 2017-06-18 DIAGNOSIS — F332 Major depressive disorder, recurrent severe without psychotic features: Secondary | ICD-10-CM | POA: Diagnosis not present

## 2017-06-18 DIAGNOSIS — F4000 Agoraphobia, unspecified: Secondary | ICD-10-CM | POA: Diagnosis not present

## 2017-06-18 DIAGNOSIS — F40248 Other situational type phobia: Secondary | ICD-10-CM | POA: Diagnosis not present

## 2017-06-21 ENCOUNTER — Other Ambulatory Visit: Payer: Self-pay | Admitting: Family Medicine

## 2017-06-21 ENCOUNTER — Ambulatory Visit: Payer: Medicare Other | Admitting: Family Medicine

## 2017-06-24 ENCOUNTER — Other Ambulatory Visit: Payer: Self-pay | Admitting: Family Medicine

## 2017-06-25 DIAGNOSIS — F332 Major depressive disorder, recurrent severe without psychotic features: Secondary | ICD-10-CM | POA: Diagnosis not present

## 2017-06-25 DIAGNOSIS — F319 Bipolar disorder, unspecified: Secondary | ICD-10-CM | POA: Diagnosis not present

## 2017-06-26 MED ORDER — PHENTERMINE HCL 37.5 MG PO CAPS: 37.5000 mg | ORAL_CAPSULE | ORAL | 0 refills | Status: DC

## 2017-06-26 NOTE — Telephone Encounter (Signed)
Pt contacted and informed of rx ready for pick up.   

## 2017-06-26 NOTE — Telephone Encounter (Signed)
Please let patient know her prescription(s) are available for pick up from the [facility] front desk.  

## 2017-06-27 DIAGNOSIS — Z23 Encounter for immunization: Secondary | ICD-10-CM | POA: Diagnosis not present

## 2017-07-02 DIAGNOSIS — F332 Major depressive disorder, recurrent severe without psychotic features: Secondary | ICD-10-CM | POA: Diagnosis not present

## 2017-07-02 DIAGNOSIS — F319 Bipolar disorder, unspecified: Secondary | ICD-10-CM | POA: Diagnosis not present

## 2017-07-16 DIAGNOSIS — F332 Major depressive disorder, recurrent severe without psychotic features: Secondary | ICD-10-CM | POA: Diagnosis not present

## 2017-07-16 DIAGNOSIS — F319 Bipolar disorder, unspecified: Secondary | ICD-10-CM | POA: Diagnosis not present

## 2017-07-30 DIAGNOSIS — F332 Major depressive disorder, recurrent severe without psychotic features: Secondary | ICD-10-CM | POA: Diagnosis not present

## 2017-07-30 DIAGNOSIS — F319 Bipolar disorder, unspecified: Secondary | ICD-10-CM | POA: Diagnosis not present

## 2017-08-02 ENCOUNTER — Telehealth: Payer: Self-pay

## 2017-08-02 MED ORDER — HYDROCODONE-ACETAMINOPHEN 5-325 MG PO TABS: 2.0000 | ORAL_TABLET | Freq: Every day | ORAL | 0 refills | Status: DC

## 2017-08-02 MED ORDER — MORPHINE SULFATE ER 15 MG PO TBCR: 15.0000 mg | EXTENDED_RELEASE_TABLET | Freq: Two times a day (BID) | ORAL | 0 refills | Status: DC

## 2017-08-02 NOTE — Telephone Encounter (Signed)
     Patient will run out of MS Contin and hydrocodone before next schedule ov Printed Rx for Hydocodone fill #60 on or after 08/06/17 Printed Rx for MS Contin fill #60 on or after 08/16/17

## 2017-08-02 NOTE — Telephone Encounter (Signed)
Please let patient know her prescription(s) are available for pick up from the [facility] front desk.  

## 2017-08-02 NOTE — Telephone Encounter (Signed)
Patient is informed. [name],CMA

## 2017-08-02 NOTE — Telephone Encounter (Signed)
Patient called and she needs a prescription filled for MS Contin which will run out on the 12th of October and Hydocodone which runs out on the  2nd. Please leave them up front.   She wants you to know that she is looking forward to seeing you because she is hoping to be under 200 lb's.[name]

## 2017-08-06 DIAGNOSIS — F332 Major depressive disorder, recurrent severe without psychotic features: Secondary | ICD-10-CM | POA: Diagnosis not present

## 2017-08-06 DIAGNOSIS — F319 Bipolar disorder, unspecified: Secondary | ICD-10-CM | POA: Diagnosis not present

## 2017-08-13 DIAGNOSIS — F332 Major depressive disorder, recurrent severe without psychotic features: Secondary | ICD-10-CM | POA: Diagnosis not present

## 2017-08-13 DIAGNOSIS — F319 Bipolar disorder, unspecified: Secondary | ICD-10-CM | POA: Diagnosis not present

## 2017-08-18 ENCOUNTER — Other Ambulatory Visit: Payer: Self-pay | Admitting: Family Medicine

## 2017-08-22 DIAGNOSIS — F3181 Bipolar II disorder: Secondary | ICD-10-CM | POA: Diagnosis not present

## 2017-08-22 DIAGNOSIS — F4312 Post-traumatic stress disorder, chronic: Secondary | ICD-10-CM | POA: Diagnosis not present

## 2017-08-27 DIAGNOSIS — F319 Bipolar disorder, unspecified: Secondary | ICD-10-CM | POA: Diagnosis not present

## 2017-08-27 DIAGNOSIS — F332 Major depressive disorder, recurrent severe without psychotic features: Secondary | ICD-10-CM | POA: Diagnosis not present

## 2017-08-30 ENCOUNTER — Ambulatory Visit (INDEPENDENT_AMBULATORY_CARE_PROVIDER_SITE_OTHER): Payer: Medicare Other | Admitting: Family Medicine

## 2017-08-30 ENCOUNTER — Encounter: Payer: Self-pay | Admitting: Family Medicine

## 2017-08-30 VITALS — BP 124/64 | HR 54 | Temp 98.0°F | Ht 64.0 in | Wt 197.8 lb

## 2017-08-30 DIAGNOSIS — K643 Fourth degree hemorrhoids: Secondary | ICD-10-CM | POA: Diagnosis not present

## 2017-08-30 DIAGNOSIS — G894 Chronic pain syndrome: Secondary | ICD-10-CM

## 2017-08-30 DIAGNOSIS — M542 Cervicalgia: Secondary | ICD-10-CM | POA: Diagnosis not present

## 2017-08-30 MED ORDER — PHENTERMINE HCL 37.5 MG PO CAPS: 37.5000 mg | ORAL_CAPSULE | ORAL | 0 refills | Status: DC

## 2017-08-30 MED ORDER — HYDROCODONE-ACETAMINOPHEN 5-325 MG PO TABS: 2.0000 | ORAL_TABLET | Freq: Every day | ORAL | 0 refills | Status: DC

## 2017-08-30 MED ORDER — MORPHINE SULFATE ER 15 MG PO TBCR: 15.0000 mg | EXTENDED_RELEASE_TABLET | Freq: Two times a day (BID) | ORAL | 0 refills | Status: DC

## 2017-08-30 MED ORDER — TOPIRAMATE 25 MG PO CPSP: ORAL_CAPSULE | ORAL | 0 refills | Status: DC

## 2017-08-30 MED ORDER — PHENTERMINE HCL 37.5 MG PO CAPS: 37.5000 mg | ORAL_CAPSULE | Freq: Every morning | ORAL | 0 refills | Status: DC

## 2017-08-30 NOTE — Progress Notes (Signed)
   Subjective:    Patient ID: [name], female    DOB: 1951/05/25, 66 y.o.   MRN: [id number] [name] is alone Sources of clinical information for visit is/are patient and past medical records. Nursing assessment for this office visit was reviewed with the patient for accuracy and revision.  Depression screen PHQ 2/9 08/30/2017  Decreased Interest 1  Down, Depressed, Hopeless 0  PHQ - 2 Score 1  Altered sleeping -  Tired, decreased energy -  Change in appetite -  Feeling bad or failure about yourself  -  Trouble concentrating -  Moving slowly or fidgety/restless -  Suicidal thoughts -  PHQ-9 Score -  Difficult doing work/chores -  Some recent data might be hidden   Fall Risk  05/31/2017 03/22/2017 02/01/2017 12/25/2016 12/05/2016  Falls in the past year? No No No No No  Number falls in past yr: - - - - -  Risk Factor Category  - - - - -  Risk for fall due to : - - - - -    HPI  Problem List Items Addressed This Visit      High   Obesity, Class III, BMI 40-49.9 (morbid obesity) ([place]) - Primary (Chronic) - Added Phentermine 7/12 to topiramate (start 2/28).  Continues to have intentional weightloss.  - - No racing thought, no irritability, no decrease need for sleep - Using "Cabbage" soup deit - No chest pain, no SOB, no headache, no seizures, no diarrhea..   Relevant Medications   phentermine 37.5 MG capsule   phentermine 37.5 MG capsule   phentermine 37.5 MG capsule     Medium   Chronic pain syndrome (Chronic) - [place] review today without evidence of doctyor shopping. - Recieivng Ativan from her psychiatrist, Dr Leatrice Jewels 1 mg TID as adjunct for Bipolar D - Pain in shoulders , neck, hips and knees. 7/10 worst, 4/10 least, 3/10 avg, current pain 6/10 - Taking hydrocone/apa 10 mg twice a day, MS Contin 15 mg twice daily Percent relief pain 90%, 2/10 interefere with general activity, 2/10 for interefering with mood, 4/10 with interefering with walking ability, 3/10  for interefering with normal work, 1/10 intereferingwith interpersonal relationships, 3/10 interefering with sleep, 2/10 interfering with enjoyment of life.         Unprioritized   Neck pain, bilateral posterior - Onset: month ago Location: bilateral neck and shoulder Quality: aching Severity: moderate 5/10 Function: not interfering with work nor home life  Pattern: steady with intermittent worseing  Course: stable Radiation: no radiation into arms Relief: Hydrocodone Precipitant: none recalled Associated Symptoms: no arm or leg weakness, no bowl or bladder changes Trauma (Acute or Chronic): bi Prior Diagnostic Testing or Treatments: no prior cervical spine imaging Relevant PMH/PSH: no prior complaint of neck discomfort.  Hx of Fibromyalgia.  Hx of spinal osteoarthritis of lumbar spine.      Internal hemorrhoids - Small internal hemorrhoids First noted on colonoscopy by Dr Juanita Craver 03/28/2003  - Episode of bleeding hemorrhoids in 05/30/2010.  - Last year with hemorrhoids descending and reascending spontaneously, but in last few months, patient unable to tuck hemorroids back up.  They are curing and itching.  She has been treating with Preparation H wipes.  - No bleeding. - She is keeping her stool soft with Miralax.    Review of Systems     Objective:   Physical Exam        Assessment & Plan:

## 2017-08-30 NOTE — Patient Instructions (Addendum)
Tapering off Topamax - Take one Topamax pill twice a day for one week, then take half a tablet twice a day for one week, then take half a tablet once a day for one week then stop - If you develop tremors, fatigue, or seizures, let our office know immediately.    Congratulations on your success with changing your diet!

## 2017-08-30 NOTE — Assessment & Plan Note (Signed)
Wt Readings from Last 3 Encounters:  08/30/17 197 lb 12.8 oz (89.7 kg)  05/31/17 224 lb (101.6 kg)  05/17/17 230 lb (104.3 kg)  Good response to Topamax with phentermine.  Has been on Topamax for  8 months and on phentermine for nearly 3 months.  Will taper of topamax.  Will continue phentermine as long as tolerating it and will stop when weight loss plateaus or adverse effect appear.

## 2017-08-30 NOTE — Assessment & Plan Note (Signed)
Established problem stable Continue MS Contin 15 mg every twelve hours. Three monthly printed RXs provided to patient dated from 09/16/17, 10/16/17, 11/15/17 Continue hydrocodone/APA 10 mg tablet #60/month. Three prescriptions dated from 09/05/17, 11/04/17, 12/04/17 [place] reviewed. No evidence of doctor shopping. Given improved pain ratings and functional improvement, will discuss trial of opiate reduction next ov.

## 2017-09-02 ENCOUNTER — Other Ambulatory Visit: Payer: Self-pay | Admitting: Family Medicine

## 2017-09-03 DIAGNOSIS — F319 Bipolar disorder, unspecified: Secondary | ICD-10-CM | POA: Diagnosis not present

## 2017-09-03 DIAGNOSIS — F332 Major depressive disorder, recurrent severe without psychotic features: Secondary | ICD-10-CM | POA: Diagnosis not present

## 2017-09-07 ENCOUNTER — Other Ambulatory Visit: Payer: Self-pay | Admitting: Family Medicine

## 2017-09-10 DIAGNOSIS — F332 Major depressive disorder, recurrent severe without psychotic features: Secondary | ICD-10-CM | POA: Diagnosis not present

## 2017-09-10 DIAGNOSIS — F319 Bipolar disorder, unspecified: Secondary | ICD-10-CM | POA: Diagnosis not present

## 2017-09-12 ENCOUNTER — Encounter: Payer: Self-pay | Admitting: Family Medicine

## 2017-09-13 ENCOUNTER — Other Ambulatory Visit: Payer: Self-pay | Admitting: Family Medicine

## 2017-09-13 MED ORDER — TOPIRAMATE 50 MG PO TABS: 50.0000 mg | ORAL_TABLET | Freq: Two times a day (BID) | ORAL | 1 refills | Status: DC

## 2017-09-17 DIAGNOSIS — F319 Bipolar disorder, unspecified: Secondary | ICD-10-CM | POA: Diagnosis not present

## 2017-09-17 DIAGNOSIS — F332 Major depressive disorder, recurrent severe without psychotic features: Secondary | ICD-10-CM | POA: Diagnosis not present

## 2017-09-19 ENCOUNTER — Other Ambulatory Visit: Payer: Self-pay | Admitting: Family Medicine

## 2017-09-24 DIAGNOSIS — F319 Bipolar disorder, unspecified: Secondary | ICD-10-CM | POA: Diagnosis not present

## 2017-09-24 DIAGNOSIS — F332 Major depressive disorder, recurrent severe without psychotic features: Secondary | ICD-10-CM | POA: Diagnosis not present

## 2017-09-26 IMAGING — DX DG LUMBAR SPINE 2-3V
2 series · 2 of 2 positions shown · non-contrast
Comparison: 02/26/2016.

CLINICAL DATA: 64-year-old female with lumbar stenosis. Preop.
Subsequent encounter.

EXAM:
LUMBAR SPINE - 2-3 VIEW

[l-spine lat]
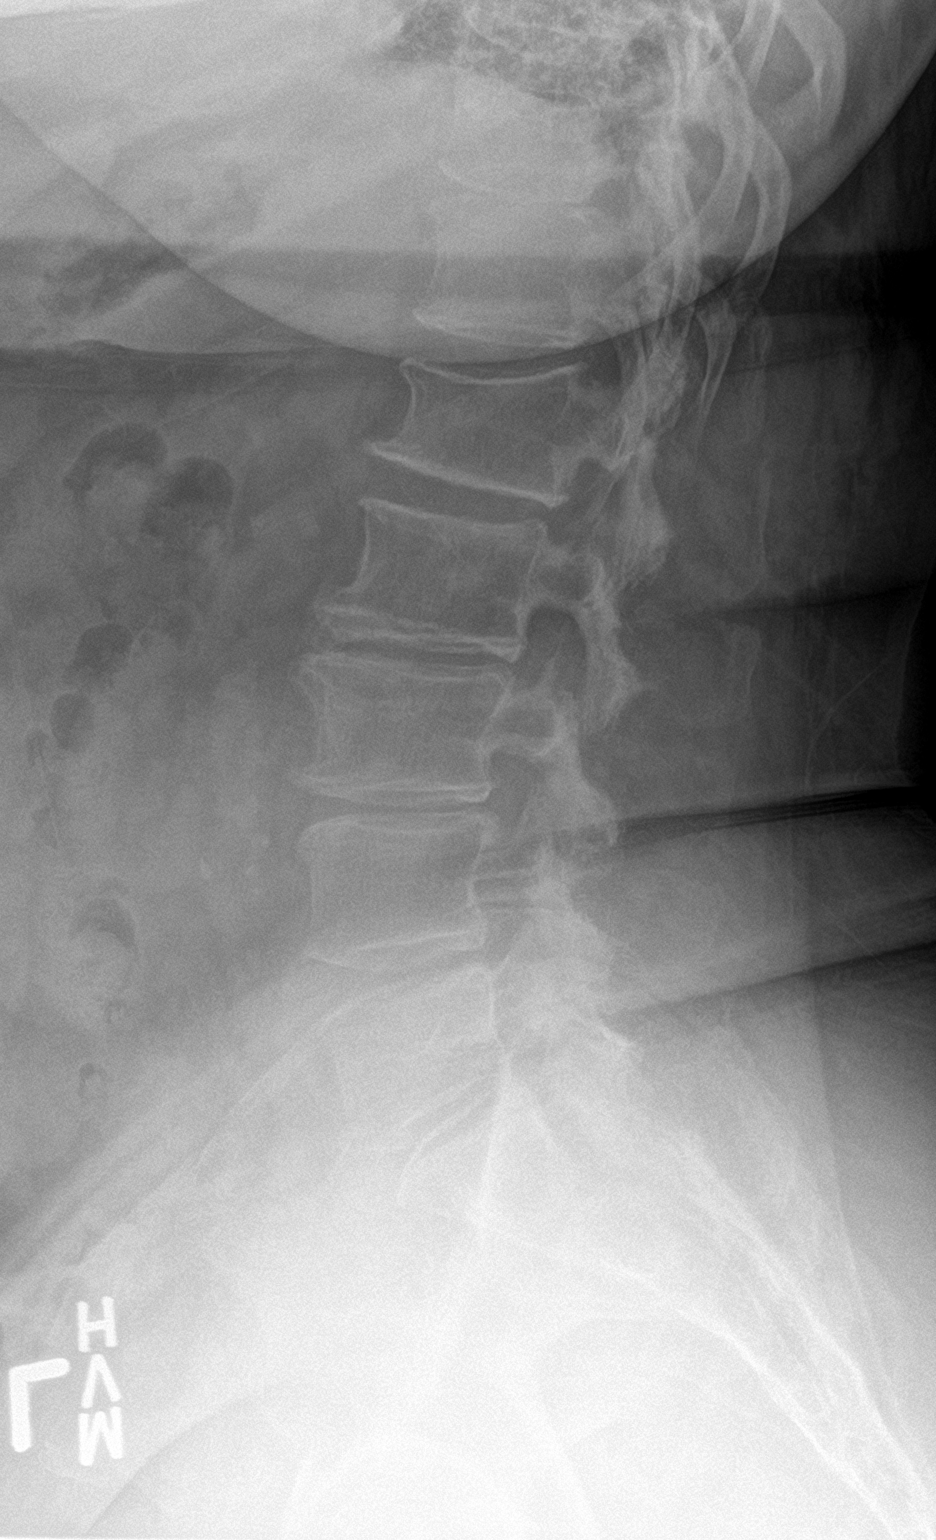

[l-spine ap]
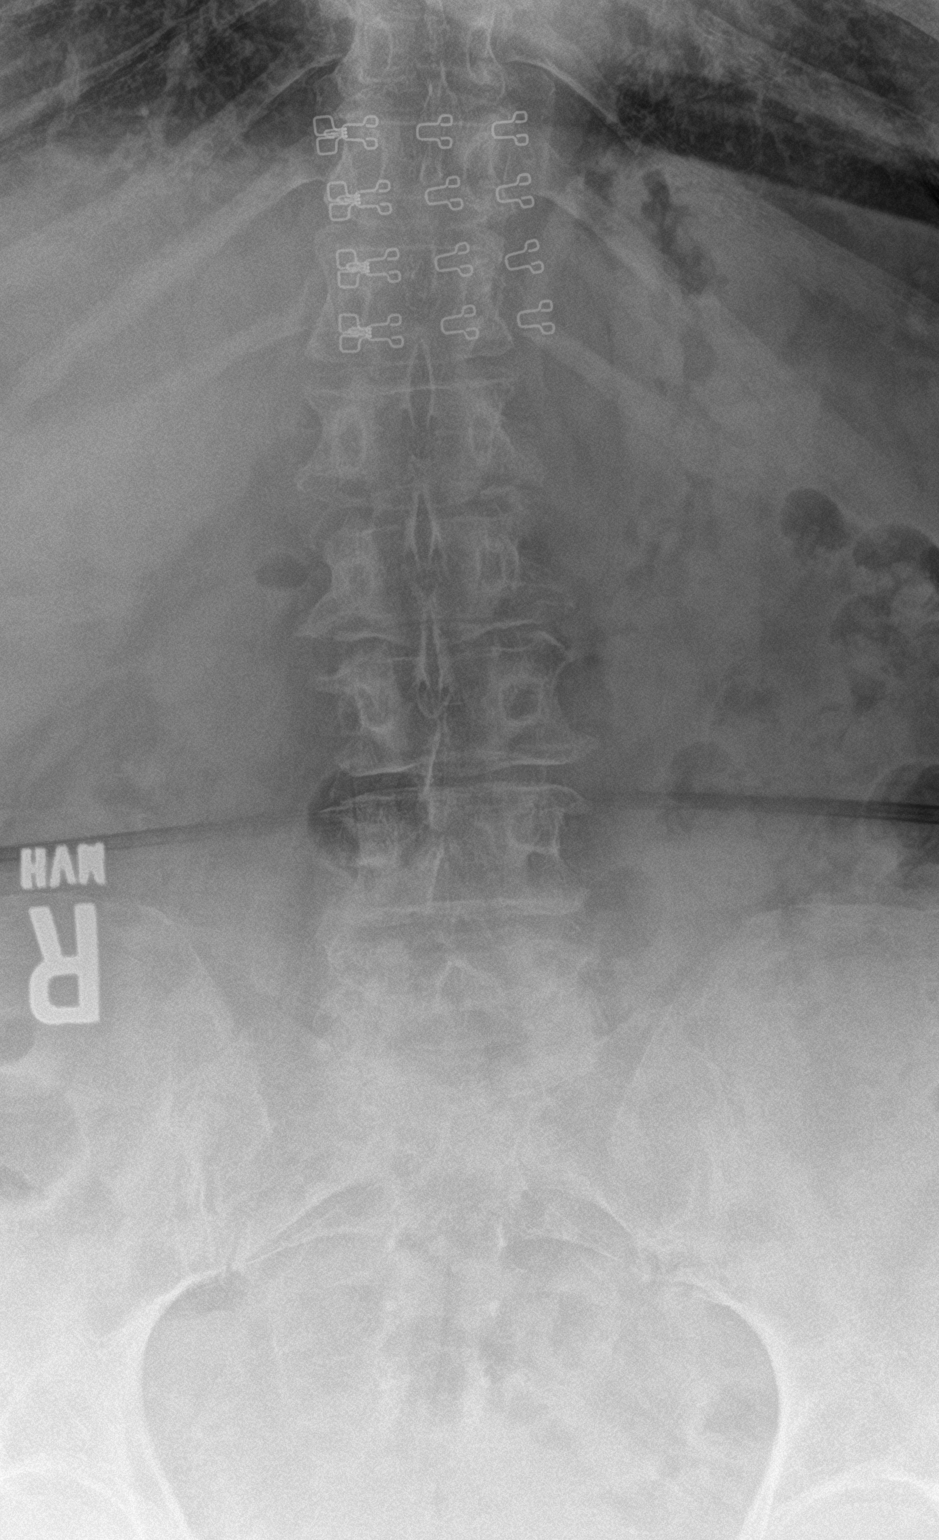

[2 of 2 positions shown; findings below may reference images not displayed]

FINDINGS: Level assignment as per prior MR.

Rojer fully open disc space labeled L5-S1.

Moderate L2-3 and L3-4 disc space narrowing.

Mild anterior wedging L1 vertebra.

Partial calcification aorta.
IMPRESSION: Level assignment as per prior MR. Rojer fully open disc space labeled
L5-S1.

Moderate L2-3 and L3-4 disc space narrowing.

Mild anterior wedging L1 vertebra.

Aortic atherosclerosis.

## 2017-10-01 DIAGNOSIS — F332 Major depressive disorder, recurrent severe without psychotic features: Secondary | ICD-10-CM | POA: Diagnosis not present

## 2017-10-01 DIAGNOSIS — F319 Bipolar disorder, unspecified: Secondary | ICD-10-CM | POA: Diagnosis not present

## 2017-10-03 IMAGING — DX DG SPINE 1V PORT
1 series · 1 of 1 positions shown · non-contrast
Comparison: Film from earlier in the same day

CLINICAL DATA: Intraoperative localization for decompression at
L4-5

EXAM:
PORTABLE SPINE - 1 VIEW

[l-spine lat]
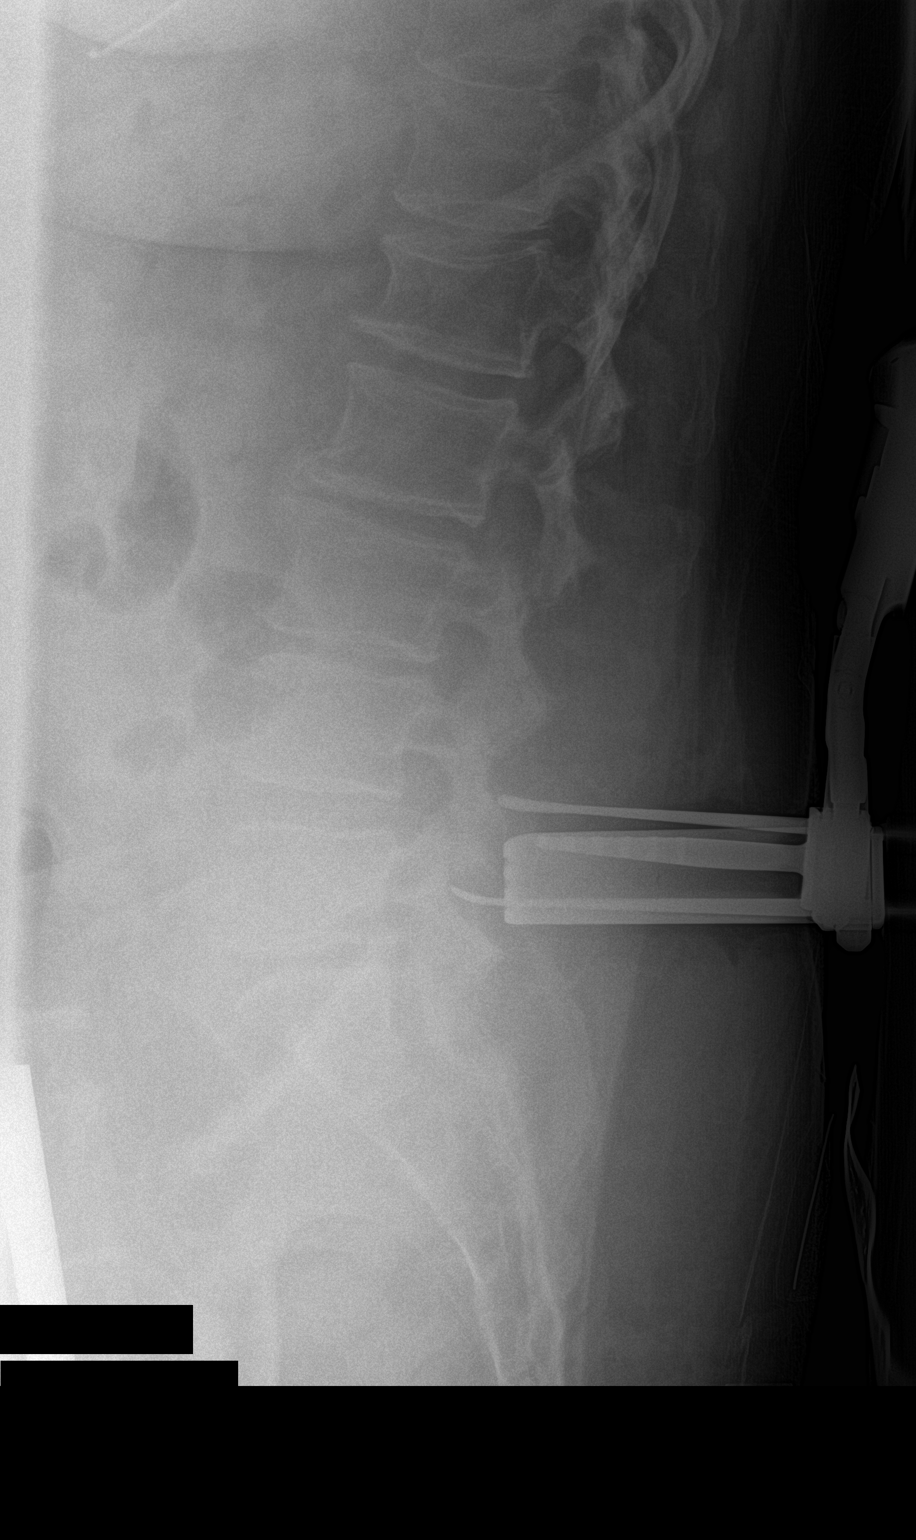

[1 of 1 positions shown; findings below may reference images not displayed]

FINDINGS: The numbering nomenclature is similar that used on the prior exam.
Surgical retractors seen with instruments noted at the L4-5 level as
well as posterior to the L5 vertebral body.
IMPRESSION: Intraoperative localization at L4-5

## 2017-10-04 ENCOUNTER — Ambulatory Visit: Payer: Medicare Other | Admitting: Family Medicine

## 2017-10-04 ENCOUNTER — Encounter: Payer: Self-pay | Admitting: Family Medicine

## 2017-10-04 VITALS — BP 132/82 | HR 53 | Temp 97.7°F | Ht 64.0 in | Wt 201.4 lb

## 2017-10-04 DIAGNOSIS — K645 Perianal venous thrombosis: Secondary | ICD-10-CM | POA: Diagnosis not present

## 2017-10-04 MED ORDER — HYDROCORTISONE ACETATE 25 MG RE SUPP: 25.0000 mg | Freq: Two times a day (BID) | RECTAL | 1 refills | Status: AC

## 2017-10-04 MED ORDER — PRAMOXINE HCL 1 % RE FOAM: 1.0000 "application " | RECTAL | 1 refills | Status: DC | PRN

## 2017-10-04 NOTE — Patient Instructions (Signed)
There are two medications I want your to use for your thrombosed (blood clot) External Hemorrhoids  1. Proctofoam (Pramoxine) applied to rectum every 4 hours as needed to help reduce pain.  This is a topical numbning medicine like Lidocaine.  2. Hydrocortisone Suppository - Insert one suppository into rectum twice a day for next 6 days to reduce the inflammation of the thrombosed (blood clot) external Hemorrhoids  We have made a referral the [facility] for consultation about these external hemorrhoids.  The can advise you if surgery will help with these hemorrhoids.    Hemorrhoids Hemorrhoids are swollen veins in and around the rectum or anus. There are two types of hemorrhoids:  Internal hemorrhoids. These occur in the veins that are just inside the rectum. They may poke through to the outside and become irritated and painful.  External hemorrhoids. These occur in the veins that are outside of the anus and can be felt as a painful swelling or hard lump near the anus.  Most hemorrhoids do not cause serious problems, and they can be managed with home treatments such as diet and lifestyle changes. If home treatments do not help your symptoms, procedures can be done to shrink or remove the hemorrhoids. What are the causes? This condition is caused by increased pressure in the anal area. This pressure may result from various things, including:  Constipation.  Straining to have a bowel movement.  Diarrhea.  Pregnancy.  Obesity.  Sitting for long periods of time.  Heavy lifting or other activity that causes you to strain.  Anal sex.  What are the signs or symptoms? Symptoms of this condition include:  Pain.  Anal itching or irritation.  Rectal bleeding.  Leakage of stool (feces).  Anal swelling.  One or more lumps around the anus.  How is this diagnosed? This condition can often be diagnosed through a visual exam. Other exams or tests may also be done, such  as:  Examination of the rectal area with a gloved hand (digital rectal exam).  Examination of the anal canal using a small tube (anoscope).  A blood test, if you have lost a significant amount of blood.  A test to look inside the colon (sigmoidoscopy or colonoscopy).  How is this treated? This condition can usually be treated at home. However, various procedures may be done if dietary changes, lifestyle changes, and other home treatments do not help your symptoms. These procedures can help make the hemorrhoids smaller or remove them completely. Some of these procedures involve surgery, and others do not. Common procedures include:  Rubber band ligation. Rubber bands are placed at the base of the hemorrhoids to cut off the blood supply to them.  Sclerotherapy. Medicine is injected into the hemorrhoids to shrink them.  Infrared coagulation. A type of light energy is used to get rid of the hemorrhoids.  Hemorrhoidectomy surgery. The hemorrhoids are surgically removed, and the veins that supply them are tied off.  Stapled hemorrhoidopexy surgery. A circular stapling device is used to remove the hemorrhoids and use staples to cut off the blood supply to them.  Follow these instructions at home: Eating and drinking  Eat foods that have a lot of fiber in them, such as whole grains, beans, nuts, fruits, and vegetables. Ask your health care provider about taking products that have added fiber (fiber supplements).  Drink enough fluid to keep your urine clear or pale yellow. Managing pain and swelling  Take warm sitz baths for 20 minutes, 3-4  times a day to ease pain and discomfort.  If directed, apply ice to the affected area. Using ice packs between sitz baths may be helpful. ? Put ice in a plastic bag. ? Place a towel between your skin and the bag. ? Leave the ice on for 20 minutes, 2-3 times a day. General instructions  Take over-the-counter and prescription medicines only as told by  your health care provider.  Use medicated creams or suppositories as told.  Exercise regularly.  Go to the bathroom when you have the urge to have a bowel movement. Do not wait.  Avoid straining to have bowel movements.  Keep the anal area dry and clean. Use wet toilet paper or moist towelettes after a bowel movement.  Do not sit on the toilet for long periods of time. This increases blood pooling and pain. Contact a health care provider if:  You have increasing pain and swelling that are not controlled by treatment or medicine.  You have uncontrolled bleeding.  You have difficulty having a bowel movement, or you are unable to have a bowel movement.  You have pain or inflammation outside the area of the hemorrhoids. This information is not intended to replace advice given to you by your health care provider. Make sure you discuss any questions you have with your health care provider. Document Released: 10/20/2000 Document Revised: 03/22/2016 Document Reviewed: 07/07/2015 Elsevier Interactive Patient Education  2017 [employer].

## 2017-10-04 NOTE — Progress Notes (Signed)
[name] is alone Sources of clinical information for visit is/are patient and past medical records. Nursing assessment for this office visit was reviewed with the patient for accuracy and revision.  Depression screen PHQ 2/9 10/04/2017  Decreased Interest -  Down, Depressed, Hopeless 3  PHQ - 2 Score 3  Altered sleeping -  Tired, decreased energy -  Change in appetite -  Feeling bad or failure about yourself  -  Trouble concentrating -  Moving slowly or fidgety/restless -  Suicidal thoughts -  PHQ-9 Score -  Difficult doing work/chores -  Some recent data might be hidden   Fall Risk  10/04/2017 05/31/2017 03/22/2017 02/01/2017 12/25/2016  Falls in the past year? No No No No No  Number falls in past yr: - - - - -  Risk Factor Category  - - - - -  Risk for fall due to : Impaired balance/gait - - - -   .Onset: over a month ago Location: anus Quality: burning Severity: moderate Function: able to work Pattern: constant with intermittent worsening Course: progressed over month Relief: minimal relief with Tucks pads and Sitz baths Associated Symptoms: feels bump around anus, no pain with defectation, no blood on tissue or on stool Prior Diagnostic Testing or Treatments: History of internal hemorrhoids on colonoscopy Relevant PMH/PSH: history rectocele repair.  Pt believes she has had surgery for hemorroids in past.   VS reviewed GEN: Alert, Cooperative, Groomed, sitting leans to left  Rectal, external anal tags and tender < 1 cm fullness 6 O'clock, no fissures,  Anoscope: pain with insertion of anoscope: no masses.  1-2 mm stigma of bleeding just above dentate line.  Gait: Normal speed, No significant path deviation, Step through +,  Psych: Normal affect/thought/speech/language  A/P 1. Anal pain: Suspect thrombosed external hemorrhoid. - Pain present for about month  - Rx Proctofoam prn and hydrocortisone supp 1 BID x 6 days - Sitz baths.  - Referral to general surgery for  consideration of surgical intervention given the duration of pain

## 2017-10-08 DIAGNOSIS — F332 Major depressive disorder, recurrent severe without psychotic features: Secondary | ICD-10-CM | POA: Diagnosis not present

## 2017-10-08 DIAGNOSIS — F319 Bipolar disorder, unspecified: Secondary | ICD-10-CM | POA: Diagnosis not present

## 2017-10-12 DIAGNOSIS — K6289 Other specified diseases of anus and rectum: Secondary | ICD-10-CM | POA: Diagnosis not present

## 2017-10-22 DIAGNOSIS — F332 Major depressive disorder, recurrent severe without psychotic features: Secondary | ICD-10-CM | POA: Diagnosis not present

## 2017-10-22 DIAGNOSIS — F319 Bipolar disorder, unspecified: Secondary | ICD-10-CM | POA: Diagnosis not present

## 2017-10-23 DIAGNOSIS — K643 Fourth degree hemorrhoids: Secondary | ICD-10-CM | POA: Diagnosis not present

## 2017-10-23 DIAGNOSIS — K602 Anal fissure, unspecified: Secondary | ICD-10-CM | POA: Diagnosis not present

## 2017-11-05 DIAGNOSIS — F332 Major depressive disorder, recurrent severe without psychotic features: Secondary | ICD-10-CM | POA: Diagnosis not present

## 2017-11-05 DIAGNOSIS — F319 Bipolar disorder, unspecified: Secondary | ICD-10-CM | POA: Diagnosis not present

## 2017-11-08 ENCOUNTER — Other Ambulatory Visit: Payer: Self-pay | Admitting: Family Medicine

## 2017-11-12 DIAGNOSIS — F319 Bipolar disorder, unspecified: Secondary | ICD-10-CM | POA: Diagnosis not present

## 2017-11-12 DIAGNOSIS — F332 Major depressive disorder, recurrent severe without psychotic features: Secondary | ICD-10-CM | POA: Diagnosis not present

## 2017-11-15 ENCOUNTER — Other Ambulatory Visit: Payer: Self-pay

## 2017-11-15 ENCOUNTER — Encounter: Payer: Self-pay | Admitting: Family Medicine

## 2017-11-15 ENCOUNTER — Ambulatory Visit (INDEPENDENT_AMBULATORY_CARE_PROVIDER_SITE_OTHER): Payer: Medicare Other | Admitting: Family Medicine

## 2017-11-15 VITALS — BP 124/58 | HR 60 | Ht 64.0 in | Wt 215.0 lb

## 2017-11-15 DIAGNOSIS — R918 Other nonspecific abnormal finding of lung field: Secondary | ICD-10-CM | POA: Diagnosis not present

## 2017-11-15 DIAGNOSIS — G2581 Restless legs syndrome: Secondary | ICD-10-CM | POA: Diagnosis not present

## 2017-11-15 DIAGNOSIS — M47896 Other spondylosis, lumbar region: Secondary | ICD-10-CM | POA: Diagnosis not present

## 2017-11-15 DIAGNOSIS — F3181 Bipolar II disorder: Secondary | ICD-10-CM | POA: Diagnosis not present

## 2017-11-15 DIAGNOSIS — M542 Cervicalgia: Secondary | ICD-10-CM

## 2017-11-15 DIAGNOSIS — M160 Bilateral primary osteoarthritis of hip: Secondary | ICD-10-CM

## 2017-11-15 DIAGNOSIS — R911 Solitary pulmonary nodule: Secondary | ICD-10-CM | POA: Diagnosis not present

## 2017-11-15 DIAGNOSIS — D125 Benign neoplasm of sigmoid colon: Secondary | ICD-10-CM

## 2017-11-15 DIAGNOSIS — E039 Hypothyroidism, unspecified: Secondary | ICD-10-CM

## 2017-11-15 DIAGNOSIS — G8929 Other chronic pain: Secondary | ICD-10-CM

## 2017-11-15 DIAGNOSIS — M48061 Spinal stenosis, lumbar region without neurogenic claudication: Secondary | ICD-10-CM | POA: Diagnosis not present

## 2017-11-15 DIAGNOSIS — G894 Chronic pain syndrome: Secondary | ICD-10-CM | POA: Diagnosis not present

## 2017-11-15 DIAGNOSIS — E66813 Obesity, class 3: Secondary | ICD-10-CM

## 2017-11-15 DIAGNOSIS — IMO0001 Reserved for inherently not codable concepts without codable children: Secondary | ICD-10-CM

## 2017-11-15 MED ORDER — PHENTERMINE HCL 37.5 MG PO CAPS: 37.5000 mg | ORAL_CAPSULE | ORAL | 0 refills | Status: DC

## 2017-11-15 MED ORDER — MORPHINE SULFATE ER 15 MG PO TBCR: 15.0000 mg | EXTENDED_RELEASE_TABLET | Freq: Two times a day (BID) | ORAL | 0 refills | Status: DC

## 2017-11-15 MED ORDER — HYDROCODONE-ACETAMINOPHEN 5-325 MG PO TABS: 2.0000 | ORAL_TABLET | Freq: Every day | ORAL | 0 refills | Status: DC

## 2017-11-15 MED ORDER — PHENTERMINE HCL 37.5 MG PO CAPS: 37.5000 mg | ORAL_CAPSULE | Freq: Every morning | ORAL | 0 refills | Status: DC

## 2017-11-15 MED ORDER — TOPIRAMATE 50 MG PO TABS: 50.0000 mg | ORAL_TABLET | Freq: Two times a day (BID) | ORAL | 2 refills | Status: DC

## 2017-11-15 NOTE — Patient Instructions (Signed)
Your blood pressure looks good. Keep working on diet and exercise for your weight loss plan.   Dr Juandavid Dallman will call you if your tests are not good. Otherwise he will send you a letter.  If you sign up for MyChart online, you will be able to see your test results once Dr Yardley Beltran has reviewed them.  If you do not hear from [place] with in 2 weeks please call our office

## 2017-11-16 ENCOUNTER — Encounter: Payer: Self-pay | Admitting: Family Medicine

## 2017-11-16 LAB — TSH: TSH: 4.39 u[IU]/mL (ref 0.450–4.500)

## 2017-11-16 NOTE — Assessment & Plan Note (Signed)
Hyperplastic polyp 2009 screening colonoscopy Son with colon cancer at age < 36. Referral to [facility] for repeat screening colonoscopy

## 2017-11-16 NOTE — Assessment & Plan Note (Signed)
Established problem Controlled Continue current therapy regiment. Rx for MS Contin 3 monthly prescriptions and 3 HC/APAP 5 mg Rxs sent into pharmacy Review of [place] CSRS DB showed no evidence of doctor shopping. 

## 2017-11-16 NOTE — Assessment & Plan Note (Signed)
Established problem Checking TSH monitoring tx.  Will assess LT4 tx based on lab.

## 2017-11-16 NOTE — Assessment & Plan Note (Addendum)
Established problem worsened.  Pt planning to eat small meals thruout day instead of eating large meal at night, cutting out candies and pastries.  Gentle return to walking daily Tolerating Phentermine and Topamax. Will continue for 3 months.

## 2017-11-16 NOTE — Progress Notes (Signed)
Subjective:    Patient ID: [name], female    DOB: [birth year], 67 y.o.   MRN: [id number] BERENIS CORTER is alone Sources of clinical information for visit is/are patient and past medical records. Nursing assessment for this office visit was reviewed with the patient for accuracy and revision.  Previous Report(s) Reviewed: historical medical records  Depression screen [facility] 2/9 11/15/2017  Decreased Interest 1  Down, Depressed, Hopeless 1  PHQ - 2 Score 2  Altered sleeping 1  Tired, decreased energy 1  Change in appetite 2  Feeling bad or failure about yourself  2  Trouble concentrating 1  Moving slowly or fidgety/restless 1  Suicidal thoughts 0  PHQ-9 Score 10  Difficult doing work/chores Somewhat difficult  Some recent data might be hidden   Fall Risk  10/04/2017 05/31/2017 03/22/2017 02/01/2017 12/25/2016  Falls in the past year? No No No No No  Number falls in past yr: - - - - -  Risk Factor Category  - - - - -  Risk for fall due to : Impaired balance/gait - - - -    HPI  Problem List Items Addressed This Visit      High   Obesity, Class III, BMI 40-49.9 (morbid obesity) (HCC) (Chronic) - Longstanding issue for patient - Gained 14 pounds since November.  Pt attributes to Christmas foods and candies and not eating during the day, then ovcereating at nite.  - No formal exercise. - Working part-time as Scientist, water quality - taking topiramate 50 mg BID and Phentermine 37.5 mg each morning.   - denies change in vision, somnolence, dizziness, diarrhea - denies palpitations, change in sleep pattern, tremor   Relevant Medications   topiramate (TOPAMAX) 50 MG tablet   phentermine 37.5 MG capsule   phentermine 37.5 MG capsule   phentermine 37.5 MG capsule   Bipolar 2 disorder (HCC) (Chronic) - longstanding - Followed by psychiatry (Dr Reece Levy) - No changes in meds - PHQ9 10, somewhat distressed - denies manic episodes.      Medium   Hypothyroidism  (Chronic) Hypothyroidism Longstanding issue for patient  Symptoms consist of fatigue.  Symptoms have present for several years.  The symptoms are no.   The problem has been stable.   Previous thyroid studies include TSH.  The hypothyroidism is due to incipient hypothyroidism.    Relevant Orders   TSH (Completed)   RESTLESS LEGS SYNDROME (Chronic) - stable. Responsive to opiates MS Contin and HC/APAP 5 mg twice a day    Relevant Medications   morphine (MS CONTIN) 15 MG 12 hr tablet   morphine (MS CONTIN) 15 MG 12 hr tablet   morphine (MS CONTIN) 15 MG 12 hr tablet   HYDROcodone-acetaminophen (NORCO) 5-325 MG tablet   HYDROcodone-acetaminophen (NORCO) 5-325 MG tablet   HYDROcodone-acetaminophen (NORCO/VICODIN) 5-325 MG tablet (Start on 12/05/2017)   Chronic pain syndrome (Chronic) - Longstanding pain - predominant locations: bilateral neck, right posterior nape, low back midline and left buttock (worse) - Chronic Pain Follow-Up Assessment  Pain scale rating at its BEST in the past 24 hours (1 to 10): 3 Pain scale rating at its WORST in the past 24 hours (1 to 10): 8 Adverse Effects:  (None__ Nausea__ Vomiting__ Confusion__ Sleepiness__ Fatigue__ Constipation__ Other__) Treatment of Adverse Effects: constipation Since the last clinic visit, how much relief have pain treatment and medication provided? Please circle the one percentage that shows how much relief you have received: 0%__ 10%__ 20%__ 30%__ 40%__ 50%__ 60%__ 70%__  80%__ 90%__ 100%_x_ [name] the one number that describes how, during the past 24 hours, pain has interfered with your: General Activity 0__ 1__ 2_x_ 3__ 4__ 5__ 6__ 7__ 8__ 9__ 10__ Does not interfere      Completely interferes Mood 0__ 1__ 2__ 3_x_ 4__ 5__ 6__ 7__ 8__ 9__ 10__ Does not interfere      Completely interferes Ability to work (in or out of the home) 0__ 1__ 2__ 3_x_ 4__ 5__ 6__ 7__ 8__ 9__ 10__ Does not interfere      Completely  interferes Interactions with other people 0__ 1__ 2__ 3_x_ 4__ 5__ 6__ 7__ 8__ 9__ 10__ Does not interfere      Completely interferes Sleep 0__ 1__ 2__ 3__ 4_x_ 5__ 6__ 7__ 8__ 9__ 10__ Does not interfere      Completely interferes  Enjoyment of life 0__ 1__ 2__ 3__ 4_x_ 5__ 6__ 7__ 8__ 9__ 10__ Does not interfere      Completely interferes    Relevant Medications   morphine (MS CONTIN) 15 MG 12 hr tablet   morphine (MS CONTIN) 15 MG 12 hr tablet   morphine (MS CONTIN) 15 MG 12 hr tablet   HYDROcodone-acetaminophen (NORCO) 5-325 MG tablet   HYDROcodone-acetaminophen (NORCO) 5-325 MG tablet   HYDROcodone-acetaminophen (NORCO/VICODIN) 5-325 MG tablet (Start on 12/05/2017)   Hip osteoarthritis (Chronic) - no pain currently other than buttock pain on left   Relevant Medications   morphine (MS CONTIN) 15 MG 12 hr tablet   morphine (MS CONTIN) 15 MG 12 hr tablet   morphine (MS CONTIN) 15 MG 12 hr tablet   HYDROcodone-acetaminophen (NORCO) 5-325 MG tablet   HYDROcodone-acetaminophen (NORCO) 5-325 MG tablet   HYDROcodone-acetaminophen (NORCO/VICODIN) 5-325 MG tablet (Start on 12/05/2017)   Osteoarthritis of spine (Chronic) - adequate control of low back pain   Relevant Medications   morphine (MS CONTIN) 15 MG 12 hr tablet   morphine (MS CONTIN) 15 MG 12 hr tablet   morphine (MS CONTIN) 15 MG 12 hr tablet   HYDROcodone-acetaminophen (NORCO) 5-325 MG tablet   HYDROcodone-acetaminophen (NORCO) 5-325 MG tablet   HYDROcodone-acetaminophen (NORCO/VICODIN) 5-325 MG tablet (Start on 12/05/2017)   Spinal stenosis of lumbar region - Primary (Chronic) - right buttock pain adequate controlled   Relevant Medications   morphine (MS CONTIN) 15 MG 12 hr tablet   morphine (MS CONTIN) 15 MG 12 hr tablet   morphine (MS CONTIN) 15 MG 12 hr tablet   HYDROcodone-acetaminophen (NORCO) 5-325 MG tablet   HYDROcodone-acetaminophen (NORCO) 5-325 MG tablet   HYDROcodone-acetaminophen (NORCO/VICODIN) 5-325 MG  tablet (Start on 12/05/2017)     Low   Encounter for chronic pain management (Chronic)     Unprioritized   Neck pain, bilateral posterior - stable. No radiation no limb weakness.    Relevant Medications   morphine (MS CONTIN) 15 MG 12 hr tablet   morphine (MS CONTIN) 15 MG 12 hr tablet   morphine (MS CONTIN) 15 MG 12 hr tablet   HYDROcodone-acetaminophen (NORCO) 5-325 MG tablet   HYDROcodone-acetaminophen (NORCO) 5-325 MG tablet   HYDROcodone-acetaminophen (NORCO/VICODIN) 5-325 MG tablet (Start on 12/05/2017)   SIGMOID POLYP - Dx in 2012 - Wants to pursue surveillance colonosocpy with [place] - (+) occasional blood on stool surface.    Relevant Orders   Ambulatory referral to Gastroenterology   RESOLVED: Solitary lung nodule    Other Visit Diagnoses    Lung nodule < 6cm on CT   - Found as incidentaloma in 2012.  No follow up CT performed - no cough, no hemoptysis, no wheeze, distant smoking.      Relevant Orders   CT Chest Wo Contrast     SH: Smoking stopped > 5 yrs ago, living with dgt.      Review of Systems    see HPI Objective:   Physical Exam  VS reviewed GEN: Alert, Cooperative, Groomed, NAD HEENT: PERRL; EAC bilaterally not occluded, TM's translucent with normal LM, (+) LR;                No cervical LAN, No thyromegaly, No palpable masses COR: RRR, No M/G/R, No JVD, Normal PMI size and location LUNGS: BCTA, No Acc mm use, speaking in full sentences EXT: No peripheral leg edema.   Neuro: Oriented to person, place, and time; g ait: Normal speed, No significant path deviation, Step through +,  Psych: Normal affect/thought/speech/language \   Assessment & Plan:  See problem list

## 2017-11-16 NOTE — Assessment & Plan Note (Signed)
Established problem Controlled. (+) left buttock pain with standing Continue current therapy regiment.

## 2017-11-16 NOTE — Assessment & Plan Note (Signed)
Established problem Controlled Continue current therapy prn HC/APAP regiment.

## 2017-11-16 NOTE — Assessment & Plan Note (Signed)
Established problem Controlled Continue current therapy regiment.  

## 2017-11-16 NOTE — Assessment & Plan Note (Addendum)
Established problem Controlled. No radicular component. Musculoskeletal in origin.  Both cervical spondylosis with cervical muscle myofascial pain.  Pt with fibromyalgia.  Continue current therapy regiment.

## 2017-11-16 NOTE — Assessment & Plan Note (Signed)
Reviewed [place] DB. No evidence doctor shopping.

## 2017-11-16 NOTE — Assessment & Plan Note (Signed)
Established problem Controlled Continue current therapy regiment from Dr Reece Levy (Psych).

## 2017-11-19 DIAGNOSIS — F332 Major depressive disorder, recurrent severe without psychotic features: Secondary | ICD-10-CM | POA: Diagnosis not present

## 2017-11-19 DIAGNOSIS — F319 Bipolar disorder, unspecified: Secondary | ICD-10-CM | POA: Diagnosis not present

## 2017-11-26 DIAGNOSIS — F332 Major depressive disorder, recurrent severe without psychotic features: Secondary | ICD-10-CM | POA: Diagnosis not present

## 2017-11-26 DIAGNOSIS — F319 Bipolar disorder, unspecified: Secondary | ICD-10-CM | POA: Diagnosis not present

## 2017-11-28 ENCOUNTER — Ambulatory Visit
Admission: RE | Admit: 2017-11-28 | Discharge: 2017-11-28 | Disposition: A | Discharge status: Home / Self Care | Payer: Medicare Other | Source: Ambulatory Visit | Attending: Family Medicine | Admitting: Family Medicine

## 2017-11-28 DIAGNOSIS — IMO0001 Reserved for inherently not codable concepts without codable children: Secondary | ICD-10-CM

## 2017-11-28 DIAGNOSIS — R918 Other nonspecific abnormal finding of lung field: Secondary | ICD-10-CM

## 2017-11-28 DIAGNOSIS — R911 Solitary pulmonary nodule: Secondary | ICD-10-CM

## 2017-12-03 ENCOUNTER — Telehealth: Payer: Self-pay | Admitting: Family Medicine

## 2017-12-03 ENCOUNTER — Encounter: Payer: Self-pay | Admitting: Family Medicine

## 2017-12-03 DIAGNOSIS — I2510 Atherosclerotic heart disease of native coronary artery without angina pectoris: Secondary | ICD-10-CM

## 2017-12-03 HISTORY — DX: Atherosclerotic heart disease of native coronary artery without angina pectoris: I25.10

## 2017-12-04 ENCOUNTER — Encounter: Payer: Self-pay | Admitting: Family Medicine

## 2017-12-04 NOTE — Telephone Encounter (Signed)
x

## 2017-12-10 ENCOUNTER — Telehealth: Payer: Self-pay | Admitting: Family Medicine

## 2017-12-10 DIAGNOSIS — M47896 Other spondylosis, lumbar region: Secondary | ICD-10-CM

## 2017-12-10 DIAGNOSIS — K2100 Gastro-esophageal reflux disease with esophagitis, without bleeding: Secondary | ICD-10-CM

## 2017-12-10 DIAGNOSIS — G894 Chronic pain syndrome: Secondary | ICD-10-CM

## 2017-12-10 DIAGNOSIS — K589 Irritable bowel syndrome without diarrhea: Secondary | ICD-10-CM

## 2017-12-10 DIAGNOSIS — E876 Hypokalemia: Secondary | ICD-10-CM

## 2017-12-10 DIAGNOSIS — K929 Disease of digestive system, unspecified: Secondary | ICD-10-CM

## 2017-12-10 DIAGNOSIS — I10 Essential (primary) hypertension: Secondary | ICD-10-CM

## 2017-12-10 NOTE — Telephone Encounter (Signed)
Note placed in PCP's box. [name],CMA

## 2017-12-10 NOTE — Telephone Encounter (Signed)
Patient came by office with note for pcp in regards to med refills. Patient stated she now needs these to be sent in mail due to cost. Any questions call [phone]. Placed in blue team folder

## 2017-12-12 DIAGNOSIS — E876 Hypokalemia: Secondary | ICD-10-CM

## 2017-12-12 HISTORY — DX: Hypokalemia: E87.6

## 2017-12-12 MED ORDER — TIZANIDINE HCL 2 MG PO TABS: 2.0000 mg | ORAL_TABLET | Freq: Two times a day (BID) | ORAL | 3 refills | Status: DC | PRN

## 2017-12-12 MED ORDER — DICYCLOMINE HCL 20 MG PO TABS: ORAL_TABLET | ORAL | 6 refills | Status: DC

## 2017-12-12 MED ORDER — RAMIPRIL 10 MG PO CAPS: 10.0000 mg | ORAL_CAPSULE | Freq: Every day | ORAL | 3 refills | Status: DC

## 2017-12-12 MED ORDER — METOPROLOL TARTRATE 50 MG PO TABS: 25.0000 mg | ORAL_TABLET | Freq: Two times a day (BID) | ORAL | 3 refills | Status: DC

## 2017-12-12 MED ORDER — POTASSIUM CHLORIDE CRYS ER 10 MEQ PO TBCR: 40.0000 meq | EXTENDED_RELEASE_TABLET | Freq: Every day | ORAL | 12 refills | Status: DC

## 2017-12-12 MED ORDER — HYDROCHLOROTHIAZIDE 25 MG PO TABS: 25.0000 mg | ORAL_TABLET | Freq: Every day | ORAL | 4 refills | Status: DC

## 2017-12-12 MED ORDER — OMEPRAZOLE 40 MG PO CPDR: 40.0000 mg | DELAYED_RELEASE_CAPSULE | Freq: Every day | ORAL | 3 refills | Status: DC

## 2017-12-12 NOTE — Telephone Encounter (Signed)
Scripts faxed to [facility] [phone]. [name],CMA

## 2017-12-12 NOTE — Telephone Encounter (Signed)
Paper script refills comppleted to send to [facility], except Phentermine. Phentermine prescription not sent to mail order pharmacy bc patient has three paper Rxs for one month supplies of her phentermine medication each dated 11/15/17; one to dispense immediately, one to fill after 30 days from Rx date, and one to fill after 60 days from Rx date.

## 2017-12-14 ENCOUNTER — Encounter: Payer: Self-pay | Admitting: Family Medicine

## 2017-12-17 DIAGNOSIS — F332 Major depressive disorder, recurrent severe without psychotic features: Secondary | ICD-10-CM | POA: Diagnosis not present

## 2017-12-17 DIAGNOSIS — F319 Bipolar disorder, unspecified: Secondary | ICD-10-CM | POA: Diagnosis not present

## 2017-12-24 DIAGNOSIS — F319 Bipolar disorder, unspecified: Secondary | ICD-10-CM | POA: Diagnosis not present

## 2017-12-24 DIAGNOSIS — F332 Major depressive disorder, recurrent severe without psychotic features: Secondary | ICD-10-CM | POA: Diagnosis not present

## 2017-12-28 ENCOUNTER — Encounter: Payer: Self-pay | Admitting: Family Medicine

## 2017-12-31 ENCOUNTER — Other Ambulatory Visit: Payer: Self-pay | Admitting: Family Medicine

## 2017-12-31 DIAGNOSIS — F319 Bipolar disorder, unspecified: Secondary | ICD-10-CM | POA: Diagnosis not present

## 2017-12-31 DIAGNOSIS — F332 Major depressive disorder, recurrent severe without psychotic features: Secondary | ICD-10-CM | POA: Diagnosis not present

## 2017-12-31 DIAGNOSIS — Z1231 Encounter for screening mammogram for malignant neoplasm of breast: Secondary | ICD-10-CM

## 2018-01-07 DIAGNOSIS — F319 Bipolar disorder, unspecified: Secondary | ICD-10-CM | POA: Diagnosis not present

## 2018-01-07 DIAGNOSIS — F332 Major depressive disorder, recurrent severe without psychotic features: Secondary | ICD-10-CM | POA: Diagnosis not present

## 2018-01-09 ENCOUNTER — Ambulatory Visit
Admission: RE | Admit: 2018-01-09 | Discharge: 2018-01-09 | Disposition: A | Discharge status: Home / Self Care | Payer: Medicare Other | Source: Ambulatory Visit | Attending: Family Medicine | Admitting: Family Medicine

## 2018-01-09 DIAGNOSIS — Z1231 Encounter for screening mammogram for malignant neoplasm of breast: Secondary | ICD-10-CM | POA: Diagnosis not present

## 2018-01-14 DIAGNOSIS — F332 Major depressive disorder, recurrent severe without psychotic features: Secondary | ICD-10-CM | POA: Diagnosis not present

## 2018-01-14 DIAGNOSIS — F319 Bipolar disorder, unspecified: Secondary | ICD-10-CM | POA: Diagnosis not present

## 2018-01-15 ENCOUNTER — Telehealth: Payer: Self-pay

## 2018-01-15 NOTE — Telephone Encounter (Signed)
Request for diabetic medications left in provider box for review.

## 2018-01-17 NOTE — Telephone Encounter (Signed)
I do not see that strips have been prescribed in past and patient not on insulin  Will not prescribe

## 2018-01-21 DIAGNOSIS — F319 Bipolar disorder, unspecified: Secondary | ICD-10-CM | POA: Diagnosis not present

## 2018-01-21 DIAGNOSIS — F332 Major depressive disorder, recurrent severe without psychotic features: Secondary | ICD-10-CM | POA: Diagnosis not present

## 2018-01-28 ENCOUNTER — Ambulatory Visit: Payer: Self-pay | Admitting: Surgery

## 2018-01-28 NOTE — H&P (Signed)
[name] Documented: 10/23/2017 10:21 AM Location: [facility] Patient #: [id number] DOB: August 28, 1951 Divorced / Language: [name] / Race: White Female   History of Present Illness [name]; 10/23/2017 1:11 PM) The patient is a 67 year old female who presents with anal pain. Note for "Anal pain": ` ` ` Patient sent for surgical consultation at the request of [name]  Chief Complaint: Anal pain. Fissure versus hemorrhoids.  The patient is a pleasant obese female with fibromyalgia and bipolar disorder. On chronic narcotics for chronic pain. With hemorrhoid issues for many years. After self disimpact and do enemas. Gout on MiraLAX. She uses at every day. She moves her bowels every morning wound. Usually it is soft and regular. Feels some outside hemorrhoids. Usually they have not been too bothersome. Did have hemorrhoid surgery 1995. No other treatments or interventions since. She recalls having history of colon polyps. Apparently one of her sons had polyps at an early age as well. She's had some financial & unemployment struggles through the years. She used to be followed by [facility] gastroenterology but is no longer with them due to financial issues I believe. She is trying to reestablish with a different gastroenterology group for follow-up colonoscopies. She thinks she is about due. Not having any rectal bleeding.  However she had episode of sharp anal pain. Concern for irritated hemorrhoids. Placed on Proctofoam by her primary care physician. Urgently faded in our office to rule out thrombosed hemorrhoid. There was no evidence of that. Suspicion of possible fissure. She did not really have increased sphincter tone. Was placed on suppositories. Colorectal follow-up recommended. She comes in a week later. Then using the Anusol hydrocortisone suppositories every day. She feels like that helps if she uses it. She cannot afford Analpram very  well so she is held off. He does little blood when she wipes. No major rectal bleeding. Does not feel like things have resolved though. She spent an extra 20 minutes talking about her other health issues. Eventually I was able to redirect her to focus on her anorectal issues.  (Review of systems as stated in this history (HPI) or in the review of systems. Otherwise all other 12 point ROS are negative)   Allergies ([name], CMA; 10/23/2017 10:29 AM) ARIPiprazole *ANTIPSYCHOTICS/ANTIMANIC AGENTS*  Diclofenac-Misoprostol *ANALGESICS - ANTI-INFLAMMATORY*  Emsam *ANTIDEPRESSANTS*  Estradiol *ESTROGENS*  Ibuprofen *ANALGESICS - ANTI-INFLAMMATORY*   Medication History ([name], CMA; 10/23/2017 10:31 AM) Hydrocortisone (25MG  Suppository, Rectal) Active. Pramoxine HCl (1% Foam, Rectal) Active. Aspirin (81MG  Tablet Chewable, Oral) Active. Calcium Carbonate (600MG  Tablet, Oral) Active. Citalopram Hydrobromide (40MG  Tablet, Oral) Active. Dicyclomine HCl (10MG  Capsule, Oral) Active. Fexofenadine HCl (180MG  Tablet, Oral) Active. HydroDiuril (100MG  Tablet, Oral) Active. HydrOXYzine HCl (50MG  Tablet, Oral) Active. Potassium Chloride ER (10MEQ Capsule ER, Oral) Active. Synthroid (50MCG Tablet, Oral) Active. Ativan (1MG  Tablet, Oral) Active. Metoprolol Tartrate (50MG  Tablet, Oral) Active. Medications Reconciled  Vitals ([name] CMA; 10/23/2017 10:31 AM) 10/23/2017 10:31 AM Weight: 207.31 lb Height: 64in Body Surface Area: 1.99 m Body Mass Index: 35.58 kg/m  Temp.: 71F(Oral)  Pulse: 54 (Regular)  P.OX: 96% (Room air) BP: 140/78 (Sitting, Left Arm, Standard)       Physical Exam [name]; 10/23/2017 11:21 AM) General Mental Status-Alert. General Appearance-Not in acute distress, Not Sickly. Orientation-Oriented X3. Hydration-Well hydrated. Voice-Normal.  Integumentary Global Assessment Upon inspection and  palpation of skin surfaces of the - Axillae: non-tender, no inflammation or ulceration, no drainage. and Distribution of scalp  and body hair is normal. General Characteristics Temperature - normal warmth is noted.  Head and Neck Head-normocephalic, atraumatic with no lesions or palpable masses. Face Global Assessment - atraumatic, no absence of expression. Neck Global Assessment - no abnormal movements, no bruit auscultated on the right, no bruit auscultated on the left, no decreased range of motion, non-tender. Trachea-midline. Thyroid Gland Characteristics - non-tender.  Eye Eyeball - Left-Extraocular movements intact, No Nystagmus. Eyeball - Right-Extraocular movements intact, No Nystagmus. Cornea - Left-No Hazy. Cornea - Right-No Hazy. Sclera/Conjunctiva - Left-No scleral icterus, No Discharge. Sclera/Conjunctiva - Right-No scleral icterus, No Discharge. Pupil - Left-Direct reaction to light normal. Pupil - Right-Direct reaction to light normal.  ENMT Ears Pinna - Left - no drainage observed, no generalized tenderness observed. Right - no drainage observed, no generalized tenderness observed. Nose and Sinuses External Inspection of the Nose - no destructive lesion observed. Inspection of the nares - Left - quiet respiration. Right - quiet respiration. Mouth and Throat Lips - Upper Lip - no fissures observed, no pallor noted. Lower Lip - no fissures observed, no pallor noted. Nasopharynx - no discharge present. Oral Cavity/Oropharynx - Tongue - no dryness observed. Oral Mucosa - no cyanosis observed. Hypopharynx - no evidence of airway distress observed.  Chest and Lung Exam Inspection Movements - Normal and Symmetrical. Accessory muscles - No use of accessory muscles in breathing. Palpation Palpation of the chest reveals - Non-tender. Auscultation Breath sounds - Normal and Clear.  Cardiovascular Auscultation Rhythm - Regular. Murmurs & Other Heart  Sounds - Auscultation of the heart reveals - No Murmurs and No Systolic Clicks.  Abdomen Inspection Inspection of the abdomen reveals - No Visible peristalsis and No Abnormal pulsations. Umbilicus - No Bleeding, No Urine drainage. Palpation/Percussion Palpation and Percussion of the abdomen reveal - Soft, Non Tender, No Rebound tenderness, No Rigidity (guarding) and No Cutaneous hyperesthesia. Note: Abdomen soft. Not severely distended. No distasis recti. Small umbilical hernia present on Valsalva only. Not bothersome No other anterior abdominal wall hernias   Female Genitourinary Sexual Maturity Tanner 5 - Adult hair pattern. Note: No vaginal bleeding nor discharge   Rectal Note: Posterior midline fissure. Most sensitive area of discomfort. Increased sphincter tone. Can barely tolerate digital exam.  Right anterior left lateral partially prolapsed grade 3/4 hemorrhoids. External hemorrhoidal tags. Right posterior not as enlarged. No thrombosis. I feel no obvious rectal masses. No abscess. No fistula. No warts. No pilonidal disease. No pruritus.   Peripheral Vascular Upper Extremity Inspection - Left - No Cyanotic nailbeds, Not Ischemic. Right - No Cyanotic nailbeds, Not Ischemic.  Neurologic Neurologic evaluation reveals -normal attention span and ability to concentrate, able to name objects and repeat phrases. Appropriate fund of knowledge , normal sensation and normal coordination. Mental Status Affect - not angry, not paranoid. Cranial Nerves-Normal Bilaterally. Gait-Normal.  Neuropsychiatric Mental status exam performed with findings of-able to articulate well with normal speech/language, rate, volume and coherence, thought content normal with ability to perform basic computations and apply abstract reasoning and no evidence of hallucinations, delusions, obsessions or homicidal/suicidal ideation. Note: Pleasant. Very open & chatty.   Musculoskeletal Global  Assessment Spine, Ribs and Pelvis - no instability, subluxation or laxity. Right Upper Extremity - no instability, subluxation or laxity.  Lymphatic Head & Neck  General Head & Neck Lymphatics: Bilateral - Description - No Localized lymphadenopathy. Axillary  General Axillary Region: Bilateral - Description - No Localized lymphadenopathy. Femoral & Inguinal  Generalized Femoral & Inguinal Lymphatics: Left - Description -  No Localized lymphadenopathy. Right - Description - No Localized lymphadenopathy.    Assessment & Plan [name]; 10/23/2017 1:13 PM) ANAL FISSURE (K60.2) Impression: Posterior midline fissure. Seems partially healed, but obvious source of pain.Marland Kitchen Sphincter tone not severely increased, but she is very sensitive there. I suspect this is the main problem area for her.  Do trial of diltiazem cream and see if it will heal on its own. Otherwise, may require examination under anesthesia with partial internal sphincterotomy. Would do hemorrhoidectomies at that time as well since she seems to have chronic issues.  Addendum: patient with recurrent anal pain and fissure.  Painful hemorrhoids.  Recommended an examination under anesthesia with probable hemorrhoidectomy  Current Plans The patient was instructed to call back in 2 weeks with progress Pt Education - Pamphlet Given - Anal Fissure: discussed with patient and provided information. Pt Education - [facility] Anal Fissure (AT) Started DILTIAZEM GEL, 2% (External Gel), 1 (one) application four times daily, 15 Gram, 10/23/2017, Ref. x3. Local Order: Apply on anus for 3-6 weeks to allow fissure to heal PROLAPSED INTERNAL HEMORRHOIDS, GRADE 4 (K64.3) Impression: Partially prolapsed hemorrhoids. Right anterior & left lateral especially. Not thrombosed. They look rather chronic. I don't think that the anatomy will get better without an operation. Hemorrhoidal ligation/pexy/hemorrhoidectomy.  However, I suspect she's had these  for a while. They've not been as bothersome since she has been compliant on daily MiraLAX.  I would treat the fissure first. If the fissure is completely healed and she still struggling with symptoms, then she may benefit from surgery. Given her chronic pain and other health issues, I am concerned about her ability to tolerate such an operation. I would not rush to operate on her unless we've exhausted nonsurgical options. She would like to hold off considering surgery and treat these topically first, but she was contemplating about considering hemorrhoid surgery at some point  The anatomy & physiology of the anorectal region was discussed. The pathophysiology of hemorrhoids and differential diagnosis was discussed. Natural history progression was discussed. I stressed the importance of a bowel regimen to have daily soft bowel movements to minimize progression of disease. Goal of one BM / day ideal. Use of wet wipes, warm baths, avoiding straining, etc were emphasized.  Educational handouts further explaining the pathology, treatment options, and bowel regimen were given as well. The patient expressed understanding. Current Plans Pt Education - [facility] Hemorrhoids (Vivyan Biggers): discussed with patient and provided information. Pt Education - Pamphlet Given - The Hemorrhoid Book:  Adin Hector, M.D., F.A.C.S. Gastrointestinal and Minimally Invasive Surgery [facility] [address] [place], [place] [address] [phone] Main / Paging

## 2018-01-31 ENCOUNTER — Telehealth: Payer: Self-pay

## 2018-01-31 NOTE — Telephone Encounter (Signed)
[facility] pharmacy form placed in providers box for signature for needed mediation refills.

## 2018-02-05 NOTE — Telephone Encounter (Signed)
Patient does not have diabetes so no glucometer and its supplies are medically appropriate.

## 2018-02-06 ENCOUNTER — Other Ambulatory Visit: Payer: Self-pay | Admitting: Family Medicine

## 2018-02-07 ENCOUNTER — Ambulatory Visit: Payer: Medicare Other | Admitting: Family Medicine

## 2018-02-07 ENCOUNTER — Encounter: Payer: Self-pay | Admitting: Family Medicine

## 2018-02-07 ENCOUNTER — Ambulatory Visit (INDEPENDENT_AMBULATORY_CARE_PROVIDER_SITE_OTHER): Payer: Medicare Other | Admitting: Family Medicine

## 2018-02-07 ENCOUNTER — Encounter: Payer: Self-pay | Admitting: Gastroenterology

## 2018-02-07 ENCOUNTER — Other Ambulatory Visit: Payer: Self-pay

## 2018-02-07 VITALS — BP 124/68 | HR 62 | Temp 97.9°F | Ht 64.0 in | Wt 225.0 lb

## 2018-02-07 DIAGNOSIS — G2581 Restless legs syndrome: Secondary | ICD-10-CM

## 2018-02-07 DIAGNOSIS — M797 Fibromyalgia: Secondary | ICD-10-CM | POA: Diagnosis not present

## 2018-02-07 DIAGNOSIS — M47896 Other spondylosis, lumbar region: Secondary | ICD-10-CM | POA: Diagnosis not present

## 2018-02-07 DIAGNOSIS — Z800 Family history of malignant neoplasm of digestive organs: Secondary | ICD-10-CM | POA: Diagnosis not present

## 2018-02-07 DIAGNOSIS — G8929 Other chronic pain: Secondary | ICD-10-CM | POA: Diagnosis not present

## 2018-02-07 DIAGNOSIS — F3181 Bipolar II disorder: Secondary | ICD-10-CM | POA: Diagnosis not present

## 2018-02-07 DIAGNOSIS — M160 Bilateral primary osteoarthritis of hip: Secondary | ICD-10-CM

## 2018-02-07 DIAGNOSIS — G894 Chronic pain syndrome: Secondary | ICD-10-CM

## 2018-02-07 DIAGNOSIS — M48061 Spinal stenosis, lumbar region without neurogenic claudication: Secondary | ICD-10-CM | POA: Diagnosis not present

## 2018-02-07 MED ORDER — PHENTERMINE HCL 37.5 MG PO CAPS
37.5000 mg | ORAL_CAPSULE | ORAL | 0 refills | Status: DC
Start: 2018-02-07 — End: 2018-05-16

## 2018-02-07 MED ORDER — HYDROCODONE-ACETAMINOPHEN 5-325 MG PO TABS: 2.0000 | ORAL_TABLET | Freq: Every day | ORAL | 0 refills | Status: DC

## 2018-02-07 MED ORDER — PHENTERMINE HCL 37.5 MG PO CAPS: 37.5000 mg | ORAL_CAPSULE | ORAL | 0 refills | Status: DC

## 2018-02-07 MED ORDER — MORPHINE SULFATE ER 15 MG PO TBCR: 15.0000 mg | EXTENDED_RELEASE_TABLET | Freq: Two times a day (BID) | ORAL | 0 refills | Status: DC

## 2018-02-07 MED ORDER — TOPIRAMATE 50 MG PO TABS: 50.0000 mg | ORAL_TABLET | Freq: Two times a day (BID) | ORAL | 2 refills | Status: DC

## 2018-02-07 MED ORDER — PHENTERMINE HCL 37.5 MG PO CAPS: 37.5000 mg | ORAL_CAPSULE | Freq: Every morning | ORAL | 0 refills | Status: DC

## 2018-02-07 NOTE — Patient Instructions (Signed)
Your MS Contin and Norco , along with Phentermine and topamax  have been sent to [facility]. We will check labs next office visit.  Your blood pressure looks good.   Youw weight is up, You know what to do.

## 2018-02-08 ENCOUNTER — Encounter: Payer: Self-pay | Admitting: Family Medicine

## 2018-02-08 LAB — OPIATE SCREEN, URINE: Opiate Scrn, Ur: POSITIVE ng/mL — AB

## 2018-02-08 NOTE — Assessment & Plan Note (Signed)
Established problem Controlled Continue current therapy prn HC/APAP regiment. May try trial of dopamin agonist if herf GI sys can tolerate.

## 2018-02-08 NOTE — Assessment & Plan Note (Signed)
Established problem Controlled Continue current therapy regiment. Rx for MS Contin 3 monthly prescriptions and 3 HC/APAP 5 mg Rxs sent into pharmacy Review of [place] CSRS DB showed no evidence of doctor shopping. 

## 2018-02-08 NOTE — Assessment & Plan Note (Signed)
Established problem Controlled Continue current therapy regiment.  

## 2018-02-08 NOTE — Assessment & Plan Note (Signed)
Established problem Controlled Continue current therapy regiment. Rx for MS Contin 3 monthly prescriptions and 3 HC/APAP 5 mg Rxs sent into pharmacy Review of [place] CSRS DB showed no evidence of doctor shopping. 

## 2018-02-08 NOTE — Assessment & Plan Note (Signed)
Worsening established problem Primarily due to dietary indiscretions and inactivity. Discussed limiting sweet foods.  No ADE from To[pamax nor phentermine Will continue Phentermine and Topamax for three more months. If no improvement in weight, will stop phentermine and taper; off topamax.

## 2018-02-08 NOTE — Assessment & Plan Note (Signed)
Established problem Controlled Continue current therapy regiment. Rx for MS Contin 3 monthly prescriptions and 3 HC/APAP 5 mg Rxs sent into pharmacy Review of [place] CSRS DB showed no evidence of doctor shopping. 

## 2018-02-08 NOTE — Assessment & Plan Note (Signed)
Established problem Controlled Continue current therapy regiment. Rx for MS Contin 3 monthly prescriptions and 3 HC/APAP 5 mg Rxs sent into pharmacy Review of [place] CSRS DB showed no evidence of doctor shopping. 

## 2018-02-08 NOTE — Assessment & Plan Note (Signed)
Established problem. Stable. Adequate analgesia to promote function.  No evidence of aberrant behaviors, no significant adverse effects. Conitnues to be able to work part-time.  Continue current therapy

## 2018-02-08 NOTE — Progress Notes (Signed)
Subjective:    Patient ID: [name], female    DOB: Jan 10, 1951, 67 y.o.   MRN: [id number] [name] is alone Sources of clinical information for visit is/are patient and past medical records. Nursing assessment for this office visit was reviewed with the patient for accuracy and revision.  Previous Report(s) Reviewed: historical medical records     Office Visit from 02/07/2018 in [facility]  PHQ-9 Total Score  10     GAD 7 : Generalized Anxiety Score 02/07/2018  Nervous, Anxious, on Edge 1  Control/stop worrying 0  Worry too much - different things 0  Trouble relaxing 1  Restless 0  Easily annoyed or irritable 1  Afraid - awful might happen 0  Total GAD 7 Score 3  Anxiety Difficulty Somewhat difficult       HPI  Problem List Items Addressed This Visit      High   Obesity, Class III, BMI 40-49.9 (morbid obesity) (HCC) (Chronic) - Longstanding issue for patient - Gained 24 pounds since November. Increase simple sweets  - No formal exercise. - Working part-time as Scientist, water quality - taking topiramate 50 mg BID and Phentermine 37.5 mg each morning.   - denies change in vision, somnolence, dizziness, diarrhea - denies palpitations, change in sleep pattern, tremor   Relevant Medications   topiramate (TOPAMAX) 50 MG tablet   phentermine 37.5 MG capsule   phentermine 37.5 MG capsule   phentermine 37.5 MG capsule   Bipolar 2 disorder (HCC) (Chronic) - longstanding - Followed by psychiatry (Dr Reece Levy) - No changes in meds - PHQ9 10, somewhat distressed - denies manic episodes.      Relevant Orders   TSH (Completed)   RESTLESS LEGS SYNDROME (Chronic) - stable. Responsive to opiates MS Contin and HC/APAP 5 mg twice a day    Relevant Medications   morphine (MS CONTIN) 15 MG 12 hr tablet   morphine (MS CONTIN) 15 MG 12 hr tablet   morphine (MS CONTIN) 15 MG 12 hr tablet   HYDROcodone-acetaminophen (NORCO) 5-325 MG tablet   HYDROcodone-acetaminophen  (NORCO) 5-325 MG tablet   HYDROcodone-acetaminophen (NORCO/VICODIN) 5-325 MG tablet (Start on 12/05/2017)   Chronic pain syndrome (Chronic) - Longstanding pain - Treating with MS Contin, hydrocxodone-apap, salonpas patches, hot baths - predominant locations: -bilateral post shoulders, left post thoracic, right low lumbar and right buttocks, bilateral calves   Chronic Pain Follow-Up Assessment  Pain scale rating at its BEST in the past 24 hours (1 to 10): 4 Pain scale rating at its WORST in the past 24 hours (1 to 10): 7 Adverse Effects:  constipation Since the last clinic visit, how much relief have pain treatment and medication provided? Please circle the one percentage that shows how much relief you have received: 0%__ 10%__ 20%__x 30%__ 40%__ 50%__ 60%__ 70%__ 80%__ 90%__ 100%_x_ [name] the one number that describes how, during the past 24 hours, pain has interfered with your: General Activity 0__ 1__ 2__ 3__ 4_x_ 5__ 6__ 7__ 8__ 9__ 10__ Does not interfere      Completely interferes Mood 0__ 1__ 2__ 3__ 4_x_ 5__ 6__ 7__ 8__ 9__ 10__ Does not interfere      Completely interferes  0__ 1__ 2__ 3_x_ 4__ 5__ 6__ 7__ 8__ 9__ 10__ Does not interfere      Completely interferes Interactions with other people 0__ 1__ 2__ 3__ 4__ 5__x 6__ 7__ 8__ 9__ 10__ Does not interfere      Completely interferes Sleep  0__ 1__ 2__ 3__ 4_x_ 5__ 6__ 7__ 8__ 9__ 10__ Does not interfere      Completely interferes  Enjoyment of life 0__ 1__ 2__ 3__ 4_x_ 5__ 6__ 7__ 8__ 9__ 10__ Does not interfere      Completely interferes    Relevant Medications   morphine (MS CONTIN) 15 MG 12 hr tablet   morphine (MS CONTIN) 15 MG 12 hr tablet   morphine (MS CONTIN) 15 MG 12 hr tablet   HYDROcodone-acetaminophen (NORCO) 5-325 MG tablet   HYDROcodone-acetaminophen (NORCO) 5-325 MG tablet   HYDROcodone-acetaminophen (NORCO/VICODIN) 5-325 MG tablet (Start on 12/05/2017)   Hip osteoarthritis (Chronic) - no pain currently  other than buttock pain on left   Relevant Medications   morphine (MS CONTIN) 15 MG 12 hr tablet   morphine (MS CONTIN) 15 MG 12 hr tablet   morphine (MS CONTIN) 15 MG 12 hr tablet   HYDROcodone-acetaminophen (NORCO) 5-325 MG tablet   HYDROcodone-acetaminophen (NORCO) 5-325 MG tablet   HYDROcodone-acetaminophen (NORCO/VICODIN) 5-325 MG tablet (Start on 12/05/2017)   Osteoarthritis of spine (Chronic) - adequate control of low back pain   Relevant Medications   morphine (MS CONTIN) 15 MG 12 hr tablet   morphine (MS CONTIN) 15 MG 12 hr tablet   morphine (MS CONTIN) 15 MG 12 hr tablet   HYDROcodone-acetaminophen (NORCO) 5-325 MG tablet   HYDROcodone-acetaminophen (NORCO) 5-325 MG tablet   HYDROcodone-acetaminophen (NORCO/VICODIN) 5-325 MG tablet (Start on 12/05/2017)   Spinal stenosis of lumbar region - Primary (Chronic) - right buttock pain adequate controlled   Relevant Medications   morphine (MS CONTIN) 15 MG 12 hr tablet   morphine (MS CONTIN) 15 MG 12 hr tablet   morphine (MS CONTIN) 15 MG 12 hr tablet   HYDROcodone-acetaminophen (NORCO) 5-325 MG tablet   HYDROcodone-acetaminophen (NORCO) 5-325 MG tablet   HYDROcodone-acetaminophen (NORCO/VICODIN) 5-325 MG tablet (Start on 12/05/2017)     Low   Encounter for chronic pain management (Chronic)     Unprioritized   Neck pain, bilateral posterior - stable. No radiation no limb weakness.    Relevant Medications   morphine (MS CONTIN) 15 MG 12 hr tablet   morphine (MS CONTIN) 15 MG 12 hr tablet   morphine (MS CONTIN) 15 MG 12 hr tablet   HYDROcodone-acetaminophen (NORCO) 5-325 MG tablet   HYDROcodone-acetaminophen (NORCO) 5-325 MG tablet   HYDROcodone-acetaminophen (NORCO/VICODIN) 5-325 MG tablet (Start on 12/05/2017)   SIGMOID POLYP - Dx in 2012 - Wants to pursue surveillance colonosocpy with [facility] - (+) occasional blood on stool surface.    Relevant Orders   Ambulatory referral to Gastroenterology  CHRONIC  HYPERTENSION  Disease Monitoring  Blood pressure range: not checking  Chest pain: no   Dyspnea: no   Claudication: no   Medication compliance: yes, metoprolol 25 mg bid  Medication Side Effects  Lightheadedness: no   Urinary frequency: no   Edema: no      SH: Smoking stopped > 5 yrs ago, living with dgt.      Review of Systems    see HPI Objective:   Physical Exam  VS reviewed GEN: Alert, Cooperative, Groomed, NAD HEENT: PERRL; EAC bilaterally not occluded, TM's translucent with normal LM, (+) LR;                No cervical LAN, No thyromegaly, No palpable masses COR: RRR, No M/G/R, No JVD, Normal PMI size and location LUNGS: BCTA, No Acc mm use, speaking in full sentences EXT: No peripheral leg  edema.   Neuro: Oriented to person, place, and time; g ait: Normal speed, No significant path deviation, Step through +,  Psych: Normal affect/thought/speech/language \   Assessment & Plan:  See problem list

## 2018-02-11 DIAGNOSIS — F332 Major depressive disorder, recurrent severe without psychotic features: Secondary | ICD-10-CM | POA: Diagnosis not present

## 2018-02-11 DIAGNOSIS — F319 Bipolar disorder, unspecified: Secondary | ICD-10-CM | POA: Diagnosis not present

## 2018-02-20 DIAGNOSIS — F3181 Bipolar II disorder: Secondary | ICD-10-CM | POA: Diagnosis not present

## 2018-02-20 DIAGNOSIS — F4312 Post-traumatic stress disorder, chronic: Secondary | ICD-10-CM | POA: Diagnosis not present

## 2018-02-22 DIAGNOSIS — M7732 Calcaneal spur, left foot: Secondary | ICD-10-CM | POA: Diagnosis not present

## 2018-02-22 DIAGNOSIS — M7752 Other enthesopathy of left foot: Secondary | ICD-10-CM | POA: Diagnosis not present

## 2018-02-22 DIAGNOSIS — T148XXA Other injury of unspecified body region, initial encounter: Secondary | ICD-10-CM | POA: Diagnosis not present

## 2018-02-25 ENCOUNTER — Ambulatory Visit: Payer: Self-pay | Admitting: Podiatry

## 2018-02-27 DIAGNOSIS — M9902 Segmental and somatic dysfunction of thoracic region: Secondary | ICD-10-CM | POA: Diagnosis not present

## 2018-02-27 DIAGNOSIS — M5030 Other cervical disc degeneration, unspecified cervical region: Secondary | ICD-10-CM | POA: Diagnosis not present

## 2018-02-27 DIAGNOSIS — M9901 Segmental and somatic dysfunction of cervical region: Secondary | ICD-10-CM | POA: Diagnosis not present

## 2018-02-27 DIAGNOSIS — M5136 Other intervertebral disc degeneration, lumbar region: Secondary | ICD-10-CM | POA: Diagnosis not present

## 2018-02-27 DIAGNOSIS — M25519 Pain in unspecified shoulder: Secondary | ICD-10-CM | POA: Diagnosis not present

## 2018-02-27 DIAGNOSIS — M9903 Segmental and somatic dysfunction of lumbar region: Secondary | ICD-10-CM | POA: Diagnosis not present

## 2018-02-27 DIAGNOSIS — M47812 Spondylosis without myelopathy or radiculopathy, cervical region: Secondary | ICD-10-CM | POA: Diagnosis not present

## 2018-03-01 DIAGNOSIS — M71572 Other bursitis, not elsewhere classified, left ankle and foot: Secondary | ICD-10-CM | POA: Diagnosis not present

## 2018-03-01 DIAGNOSIS — M722 Plantar fascial fibromatosis: Secondary | ICD-10-CM | POA: Diagnosis not present

## 2018-03-01 DIAGNOSIS — T148XXA Other injury of unspecified body region, initial encounter: Secondary | ICD-10-CM | POA: Diagnosis not present

## 2018-03-04 DIAGNOSIS — F319 Bipolar disorder, unspecified: Secondary | ICD-10-CM | POA: Diagnosis not present

## 2018-03-04 DIAGNOSIS — F332 Major depressive disorder, recurrent severe without psychotic features: Secondary | ICD-10-CM | POA: Diagnosis not present

## 2018-03-11 DIAGNOSIS — M71572 Other bursitis, not elsewhere classified, left ankle and foot: Secondary | ICD-10-CM | POA: Diagnosis not present

## 2018-03-11 DIAGNOSIS — F332 Major depressive disorder, recurrent severe without psychotic features: Secondary | ICD-10-CM | POA: Diagnosis not present

## 2018-03-11 DIAGNOSIS — T148XXA Other injury of unspecified body region, initial encounter: Secondary | ICD-10-CM | POA: Diagnosis not present

## 2018-03-11 DIAGNOSIS — F319 Bipolar disorder, unspecified: Secondary | ICD-10-CM | POA: Diagnosis not present

## 2018-03-13 ENCOUNTER — Encounter (HOSPITAL_COMMUNITY): Payer: Self-pay | Admitting: *Deleted

## 2018-03-13 DIAGNOSIS — M25519 Pain in unspecified shoulder: Secondary | ICD-10-CM | POA: Diagnosis not present

## 2018-03-13 DIAGNOSIS — M9901 Segmental and somatic dysfunction of cervical region: Secondary | ICD-10-CM | POA: Diagnosis not present

## 2018-03-13 DIAGNOSIS — M5030 Other cervical disc degeneration, unspecified cervical region: Secondary | ICD-10-CM | POA: Diagnosis not present

## 2018-03-13 DIAGNOSIS — M9903 Segmental and somatic dysfunction of lumbar region: Secondary | ICD-10-CM | POA: Diagnosis not present

## 2018-03-13 DIAGNOSIS — M9902 Segmental and somatic dysfunction of thoracic region: Secondary | ICD-10-CM | POA: Diagnosis not present

## 2018-03-13 DIAGNOSIS — M47812 Spondylosis without myelopathy or radiculopathy, cervical region: Secondary | ICD-10-CM | POA: Diagnosis not present

## 2018-03-13 DIAGNOSIS — M5136 Other intervertebral disc degeneration, lumbar region: Secondary | ICD-10-CM | POA: Diagnosis not present

## 2018-03-18 ENCOUNTER — Encounter: Payer: Self-pay | Admitting: Family Medicine

## 2018-03-18 DIAGNOSIS — F319 Bipolar disorder, unspecified: Secondary | ICD-10-CM | POA: Diagnosis not present

## 2018-03-18 DIAGNOSIS — M25519 Pain in unspecified shoulder: Secondary | ICD-10-CM | POA: Diagnosis not present

## 2018-03-18 DIAGNOSIS — M9903 Segmental and somatic dysfunction of lumbar region: Secondary | ICD-10-CM | POA: Diagnosis not present

## 2018-03-18 DIAGNOSIS — M47812 Spondylosis without myelopathy or radiculopathy, cervical region: Secondary | ICD-10-CM | POA: Diagnosis not present

## 2018-03-18 DIAGNOSIS — M5030 Other cervical disc degeneration, unspecified cervical region: Secondary | ICD-10-CM | POA: Diagnosis not present

## 2018-03-18 DIAGNOSIS — F332 Major depressive disorder, recurrent severe without psychotic features: Secondary | ICD-10-CM | POA: Diagnosis not present

## 2018-03-18 DIAGNOSIS — E876 Hypokalemia: Secondary | ICD-10-CM

## 2018-03-18 DIAGNOSIS — G894 Chronic pain syndrome: Secondary | ICD-10-CM

## 2018-03-18 DIAGNOSIS — M9901 Segmental and somatic dysfunction of cervical region: Secondary | ICD-10-CM | POA: Diagnosis not present

## 2018-03-18 DIAGNOSIS — M5136 Other intervertebral disc degeneration, lumbar region: Secondary | ICD-10-CM | POA: Diagnosis not present

## 2018-03-18 DIAGNOSIS — M47896 Other spondylosis, lumbar region: Secondary | ICD-10-CM

## 2018-03-18 DIAGNOSIS — M9902 Segmental and somatic dysfunction of thoracic region: Secondary | ICD-10-CM | POA: Diagnosis not present

## 2018-03-20 ENCOUNTER — Encounter (HOSPITAL_COMMUNITY): Payer: Self-pay

## 2018-03-20 MED ORDER — TIZANIDINE HCL 2 MG PO TABS: 2.0000 mg | ORAL_TABLET | Freq: Two times a day (BID) | ORAL | 3 refills | Status: DC | PRN

## 2018-03-20 MED ORDER — POTASSIUM CHLORIDE CRYS ER 10 MEQ PO TBCR: 40.0000 meq | EXTENDED_RELEASE_TABLET | Freq: Every day | ORAL | 3 refills | Status: DC

## 2018-03-20 NOTE — Patient Instructions (Signed)
[name]  03/20/2018   Your procedure is scheduled on: 03-28-18  Report to [facility] Main  Entrance  Report to admitting at     100 PM    Call this number if you have problems the morning of surgery [phone]    Remember: Do not eat food  :After Midnight.YOU MAY HAVE CLEAR LIQUIDS UNTIL 0900 AM THEN NOTHING BY MOUTH     CLEAR LIQUID DIET   Foods Allowed                                                                     Foods Excluded  Coffee and tea, regular and decaf                             liquids that you cannot  Plain Jell-O in any flavor                                             see through such as: Fruit ices (not with fruit pulp)                                     milk, soups, orange juice  Iced Popsicles                                    All solid food Carbonated beverages, regular and diet                                    Cranberry, grape and apple juices Sports drinks like Gatorade Lightly seasoned clear broth or consume(fat free) Sugar, honey syrup   _____________________________________________________________________     Take these medicines the morning of surgery with A SIP OF WATER: TOPAMAX, TIZANIDINE, OMEPRAZOLE, MS CONTIN, METOPROLOL, ATIVAN, LEVOTHYROXINE, DICYCLOMINE(BENTYL), CELEXA, ALLEGRA, FLONASE                                You may not have any metal on your body including hair pins and              piercings  Do not wear jewelry, make-up, lotions, powders or perfumes, deodorant             Do not wear nail polish.  Do not shave  48 hours prior to surgery.     Do not bring valuables to the hospital. [place].  Contacts, dentures or bridgework may not be worn into surgery.      Patients discharged the day of surgery will not be allowed to drive home.  Name and phone number of your driver:  Special Instructions: N/A              Please read over the  following fact sheets you were given: _____________________________________________________________________             [facility] - Preparing for Surgery Before surgery, you can play an important role.  Because skin is not sterile, your skin needs to be as free of germs as possible.  You can reduce the number of germs on your skin by washing with CHG (chlorahexidine gluconate) soap before surgery.  CHG is an antiseptic cleaner which kills germs and bonds with the skin to continue killing germs even after washing. Please DO NOT use if you have an allergy to CHG or antibacterial soaps.  If your skin becomes reddened/irritated stop using the CHG and inform your nurse when you arrive at Short Stay. Do not shave (including legs and underarms) for at least 48 hours prior to the first CHG shower.  You may shave your face/neck. Please follow these instructions carefully:  1.  Shower with CHG Soap the night before surgery and the  morning of Surgery.  2.  If you choose to wash your hair, wash your hair first as usual with your  normal  shampoo.  3.  After you shampoo, rinse your hair and body thoroughly to remove the  shampoo.                           4.  Use CHG as you would any other liquid soap.  You can apply chg directly  to the skin and wash                       Gently with a scrungie or clean washcloth.  5.  Apply the CHG Soap to your body ONLY FROM THE NECK DOWN.   Do not use on face/ open                           Wound or open sores. Avoid contact with eyes, ears mouth and genitals (private parts).                       Wash face,  Genitals (private parts) with your normal soap.             6.  Wash thoroughly, paying special attention to the area where your surgery  will be performed.  7.  Thoroughly rinse your body with warm water from the neck down.  8.  DO NOT shower/wash with your normal soap after using and rinsing off  the CHG Soap.                9.  Pat yourself dry with a clean  towel.            10.  Wear clean pajamas.            11.  Place clean sheets on your bed the night of your first shower and do not  sleep with pets. Day of Surgery : Do not apply any lotions/deodorants the morning of surgery.  Please wear clean clothes to the hospital/surgery center.  FAILURE TO FOLLOW THESE INSTRUCTIONS MAY RESULT IN THE CANCELLATION OF YOUR SURGERY PATIENT SIGNATURE_________________________________  NURSE SIGNATURE__________________________________  ________________________________________________________________________

## 2018-03-21 ENCOUNTER — Other Ambulatory Visit: Payer: Self-pay

## 2018-03-21 ENCOUNTER — Encounter (HOSPITAL_COMMUNITY)
Admission: RE | Admit: 2018-03-21 | Discharge: 2018-03-21 | Disposition: A | Discharge status: Home / Self Care | Payer: Medicare Other | Source: Ambulatory Visit | Attending: Surgery | Admitting: Surgery

## 2018-03-21 ENCOUNTER — Encounter (HOSPITAL_COMMUNITY): Payer: Self-pay

## 2018-03-21 DIAGNOSIS — Z01810 Encounter for preprocedural cardiovascular examination: Secondary | ICD-10-CM | POA: Diagnosis not present

## 2018-03-21 DIAGNOSIS — Z01812 Encounter for preprocedural laboratory examination: Secondary | ICD-10-CM | POA: Diagnosis not present

## 2018-03-21 HISTORY — DX: Pneumonia, unspecified organism: J18.9

## 2018-03-21 HISTORY — DX: Bipolar disorder, unspecified: F31.9

## 2018-03-21 LAB — BASIC METABOLIC PANEL
Anion gap: 11 (ref 5–15)
BUN: 16 mg/dL (ref 6–20)
CALCIUM: 9.3 mg/dL (ref 8.9–10.3)
CHLORIDE: 99 mmol/L — AB (ref 101–111)
CO2: 27 mmol/L (ref 22–32)
CREATININE: 0.78 mg/dL (ref 0.44–1.00)
Glucose, Bld: 101 mg/dL — ABNORMAL HIGH (ref 65–99)
Potassium: 4.2 mmol/L (ref 3.5–5.1)
SODIUM: 137 mmol/L (ref 135–145)

## 2018-03-21 LAB — SURGICAL PCR SCREEN
MRSA, PCR: NEGATIVE
STAPHYLOCOCCUS AUREUS: NEGATIVE

## 2018-03-21 LAB — CBC
HCT: 39.5 % (ref 36.0–46.0)
Hemoglobin: 12.9 g/dL (ref 12.0–15.0)
MCH: 30 pg (ref 26.0–34.0)
MCHC: 32.7 g/dL (ref 30.0–36.0)
MCV: 91.9 fL (ref 78.0–100.0)
PLATELETS: 264 10*3/uL (ref 150–400)
RBC: 4.3 MIL/uL (ref 3.87–5.11)
RDW: 13.1 % (ref 11.5–15.5)
WBC: 7.9 10*3/uL (ref 4.0–10.5)

## 2018-03-22 LAB — HEMOGLOBIN A1C
Hgb A1c MFr Bld: 6 % — ABNORMAL HIGH (ref 4.8–5.6)
Mean Plasma Glucose: 126 mg/dL

## 2018-03-22 NOTE — Progress Notes (Signed)
Final ekg in epic 

## 2018-03-25 DIAGNOSIS — F319 Bipolar disorder, unspecified: Secondary | ICD-10-CM | POA: Diagnosis not present

## 2018-03-25 DIAGNOSIS — T148XXA Other injury of unspecified body region, initial encounter: Secondary | ICD-10-CM | POA: Diagnosis not present

## 2018-03-25 DIAGNOSIS — F332 Major depressive disorder, recurrent severe without psychotic features: Secondary | ICD-10-CM | POA: Diagnosis not present

## 2018-03-27 ENCOUNTER — Telehealth: Payer: Self-pay | Admitting: *Deleted

## 2018-03-27 MED ORDER — BUPIVACAINE LIPOSOME 1.3 % IJ SUSP
20.0000 mL | INTRAMUSCULAR | Status: DC
  Filled 2018-03-27: qty 20

## 2018-03-27 NOTE — Telephone Encounter (Signed)
Spoke with patient about upcoming colonoscopy on 04/22/18 with Dr.Danis. Explained to her to call us back next week because she needs to get colonoscopy cleared with surgeon. Also notified her that she will need to stop Phentermine 10 days prior to colonoscopy. Pt verbalizes understanding.

## 2018-03-28 ENCOUNTER — Ambulatory Visit (HOSPITAL_COMMUNITY): Payer: Medicare Other | Admitting: Anesthesiology

## 2018-03-28 ENCOUNTER — Other Ambulatory Visit: Payer: Self-pay

## 2018-03-28 ENCOUNTER — Encounter (HOSPITAL_COMMUNITY)
Admission: RE | Disposition: A | Discharge status: Home / Self Care | Payer: Self-pay | Source: Ambulatory Visit | Attending: Surgery

## 2018-03-28 ENCOUNTER — Encounter (HOSPITAL_COMMUNITY): Payer: Self-pay | Admitting: *Deleted

## 2018-03-28 ENCOUNTER — Ambulatory Visit (HOSPITAL_COMMUNITY)
Admission: RE | Admit: 2018-03-28 | Discharge: 2018-03-28 | Disposition: A | Discharge status: Home / Self Care | Payer: Medicare Other | Source: Ambulatory Visit | Attending: Surgery | Admitting: Surgery

## 2018-03-28 DIAGNOSIS — Z87891 Personal history of nicotine dependence: Secondary | ICD-10-CM | POA: Diagnosis not present

## 2018-03-28 DIAGNOSIS — K641 Second degree hemorrhoids: Secondary | ICD-10-CM | POA: Diagnosis not present

## 2018-03-28 DIAGNOSIS — K642 Third degree hemorrhoids: Secondary | ICD-10-CM | POA: Insufficient documentation

## 2018-03-28 DIAGNOSIS — F1120 Opioid dependence, uncomplicated: Secondary | ICD-10-CM | POA: Diagnosis not present

## 2018-03-28 DIAGNOSIS — J449 Chronic obstructive pulmonary disease, unspecified: Secondary | ICD-10-CM | POA: Diagnosis not present

## 2018-03-28 DIAGNOSIS — Z79890 Hormone replacement therapy (postmenopausal): Secondary | ICD-10-CM | POA: Insufficient documentation

## 2018-03-28 DIAGNOSIS — F419 Anxiety disorder, unspecified: Secondary | ICD-10-CM | POA: Diagnosis not present

## 2018-03-28 DIAGNOSIS — Z888 Allergy status to other drugs, medicaments and biological substances status: Secondary | ICD-10-CM | POA: Diagnosis not present

## 2018-03-28 DIAGNOSIS — K219 Gastro-esophageal reflux disease without esophagitis: Secondary | ICD-10-CM | POA: Diagnosis not present

## 2018-03-28 DIAGNOSIS — K601 Chronic anal fissure: Secondary | ICD-10-CM

## 2018-03-28 DIAGNOSIS — I2510 Atherosclerotic heart disease of native coronary artery without angina pectoris: Secondary | ICD-10-CM | POA: Diagnosis not present

## 2018-03-28 DIAGNOSIS — K644 Residual hemorrhoidal skin tags: Secondary | ICD-10-CM

## 2018-03-28 DIAGNOSIS — E039 Hypothyroidism, unspecified: Secondary | ICD-10-CM | POA: Diagnosis not present

## 2018-03-28 DIAGNOSIS — G709 Myoneural disorder, unspecified: Secondary | ICD-10-CM | POA: Diagnosis not present

## 2018-03-28 DIAGNOSIS — Z7982 Long term (current) use of aspirin: Secondary | ICD-10-CM | POA: Insufficient documentation

## 2018-03-28 DIAGNOSIS — I10 Essential (primary) hypertension: Secondary | ICD-10-CM | POA: Diagnosis not present

## 2018-03-28 DIAGNOSIS — Z6841 Body Mass Index (BMI) 40.0 and over, adult: Secondary | ICD-10-CM | POA: Insufficient documentation

## 2018-03-28 DIAGNOSIS — Z79899 Other long term (current) drug therapy: Secondary | ICD-10-CM | POA: Diagnosis not present

## 2018-03-28 DIAGNOSIS — M797 Fibromyalgia: Secondary | ICD-10-CM | POA: Insufficient documentation

## 2018-03-28 DIAGNOSIS — Z886 Allergy status to analgesic agent status: Secondary | ICD-10-CM | POA: Diagnosis not present

## 2018-03-28 DIAGNOSIS — I739 Peripheral vascular disease, unspecified: Secondary | ICD-10-CM | POA: Diagnosis not present

## 2018-03-28 DIAGNOSIS — E669 Obesity, unspecified: Secondary | ICD-10-CM | POA: Diagnosis not present

## 2018-03-28 DIAGNOSIS — Z7951 Long term (current) use of inhaled steroids: Secondary | ICD-10-CM | POA: Insufficient documentation

## 2018-03-28 DIAGNOSIS — F319 Bipolar disorder, unspecified: Secondary | ICD-10-CM | POA: Diagnosis not present

## 2018-03-28 DIAGNOSIS — K602 Anal fissure, unspecified: Secondary | ICD-10-CM | POA: Insufficient documentation

## 2018-03-28 DIAGNOSIS — Z86010 Personal history of colonic polyps: Secondary | ICD-10-CM | POA: Diagnosis not present

## 2018-03-28 DIAGNOSIS — K649 Unspecified hemorrhoids: Secondary | ICD-10-CM | POA: Diagnosis not present

## 2018-03-28 HISTORY — DX: Residual hemorrhoidal skin tags: K64.4

## 2018-03-28 HISTORY — PX: EVALUATION UNDER ANESTHESIA WITH HEMORRHOIDECTOMY: SHX5624

## 2018-03-28 HISTORY — PX: SPHINCTEROTOMY: SHX5279

## 2018-03-28 HISTORY — DX: Third degree hemorrhoids: K64.2

## 2018-03-28 HISTORY — DX: Chronic anal fissure: K60.1

## 2018-03-28 SURGERY — EXAM UNDER ANESTHESIA WITH HEMORRHOIDECTOMY
Anesthesia: General | Site: Anus

## 2018-03-28 MED ORDER — ROCURONIUM BROMIDE 10 MG/ML (PF) SYRINGE
PREFILLED_SYRINGE | INTRAVENOUS | Status: AC
  Filled 2018-03-28: qty 5

## 2018-03-28 MED ORDER — OXYCODONE HCL 5 MG PO TABS: 5.0000 mg | ORAL_TABLET | Freq: Once | ORAL | Status: DC | PRN

## 2018-03-28 MED ORDER — EPHEDRINE SULFATE-NACL 50-0.9 MG/10ML-% IV SOSY
PREFILLED_SYRINGE | INTRAVENOUS | Status: DC | PRN
  Administered 2018-03-28: 10 mg via INTRAVENOUS
  Administered 2018-03-28 (×2): 5 mg via INTRAVENOUS

## 2018-03-28 MED ORDER — MEPERIDINE HCL 50 MG/ML IJ SOLN: 6.2500 mg | INTRAMUSCULAR | Status: DC | PRN

## 2018-03-28 MED ORDER — CHLORHEXIDINE GLUCONATE CLOTH 2 % EX PADS: 6.0000 | MEDICATED_PAD | Freq: Once | CUTANEOUS | Status: DC

## 2018-03-28 MED ORDER — AMBULATORY NON FORMULARY MEDICATION: 1.0000 "application " | Freq: Four times a day (QID) | 2 refills | Status: DC

## 2018-03-28 MED ORDER — BUPIVACAINE-EPINEPHRINE (PF) 0.5% -1:200000 IJ SOLN
INTRAMUSCULAR | Status: AC
  Filled 2018-03-28: qty 30

## 2018-03-28 MED ORDER — METHYLENE BLUE 0.5 % INJ SOLN
INTRAVENOUS | Status: AC
  Filled 2018-03-28: qty 10

## 2018-03-28 MED ORDER — MIDAZOLAM HCL 5 MG/5ML IJ SOLN
INTRAMUSCULAR | Status: DC | PRN
  Administered 2018-03-28 (×2): 1 mg via INTRAVENOUS

## 2018-03-28 MED ORDER — HYDROMORPHONE HCL 1 MG/ML IJ SOLN
0.2500 mg | INTRAMUSCULAR | Status: DC | PRN
  Administered 2018-03-28 (×2): 0.5 mg via INTRAVENOUS

## 2018-03-28 MED ORDER — ONDANSETRON HCL 4 MG/2ML IJ SOLN
INTRAMUSCULAR | Status: AC
  Filled 2018-03-28: qty 2

## 2018-03-28 MED ORDER — PROPOFOL 10 MG/ML IV BOLUS
INTRAVENOUS | Status: AC
Start: 2018-03-28 — End: ?
  Filled 2018-03-28: qty 20

## 2018-03-28 MED ORDER — LIDOCAINE 2% (20 MG/ML) 5 ML SYRINGE
INTRAMUSCULAR | Status: DC | PRN
  Administered 2018-03-28: 50 mg via INTRAVENOUS

## 2018-03-28 MED ORDER — PROMETHAZINE HCL 25 MG/ML IJ SOLN: 6.2500 mg | INTRAMUSCULAR | Status: DC | PRN

## 2018-03-28 MED ORDER — ACETAMINOPHEN 500 MG PO TABS
1000.0000 mg | ORAL_TABLET | ORAL | Status: AC
  Administered 2018-03-28: 1000 mg via ORAL
  Filled 2018-03-28: qty 2

## 2018-03-28 MED ORDER — DIBUCAINE 1 % RE OINT
TOPICAL_OINTMENT | RECTAL | Status: DC | PRN
  Administered 2018-03-28: 1 via RECTAL

## 2018-03-28 MED ORDER — FENTANYL CITRATE (PF) 250 MCG/5ML IJ SOLN
INTRAMUSCULAR | Status: AC
  Filled 2018-03-28: qty 5

## 2018-03-28 MED ORDER — MIDAZOLAM HCL 2 MG/2ML IJ SOLN
INTRAMUSCULAR | Status: AC
  Filled 2018-03-28: qty 2

## 2018-03-28 MED ORDER — BUPIVACAINE LIPOSOME 1.3 % IJ SUSP
INTRAMUSCULAR | Status: DC | PRN
  Administered 2018-03-28: 20 mL

## 2018-03-28 MED ORDER — PROPOFOL 10 MG/ML IV BOLUS
INTRAVENOUS | Status: AC
  Filled 2018-03-28: qty 20

## 2018-03-28 MED ORDER — DIBUCAINE 1 % RE OINT
TOPICAL_OINTMENT | RECTAL | Status: AC
  Filled 2018-03-28: qty 28

## 2018-03-28 MED ORDER — METRONIDAZOLE IN NACL 5-0.79 MG/ML-% IV SOLN
500.0000 mg | INTRAVENOUS | Status: AC
  Administered 2018-03-28 (×2): 500 mg via INTRAVENOUS
  Filled 2018-03-28: qty 100

## 2018-03-28 MED ORDER — OXYCODONE HCL 5 MG/5ML PO SOLN
5.0000 mg | Freq: Once | ORAL | Status: DC | PRN
  Filled 2018-03-28: qty 5

## 2018-03-28 MED ORDER — 0.9 % SODIUM CHLORIDE (POUR BTL) OPTIME
TOPICAL | Status: DC | PRN
  Administered 2018-03-28: 1000 mL

## 2018-03-28 MED ORDER — BUPIVACAINE-EPINEPHRINE 0.25% -1:200000 IJ SOLN
INTRAMUSCULAR | Status: DC | PRN
  Administered 2018-03-28: 20 mL

## 2018-03-28 MED ORDER — CEFAZOLIN SODIUM-DEXTROSE 2-4 GM/100ML-% IV SOLN
2.0000 g | INTRAVENOUS | Status: AC
  Administered 2018-03-28: 2 g via INTRAVENOUS
  Filled 2018-03-28: qty 100

## 2018-03-28 MED ORDER — SUGAMMADEX SODIUM 200 MG/2ML IV SOLN
INTRAVENOUS | Status: DC | PRN
  Administered 2018-03-28: 430 mg via INTRAVENOUS

## 2018-03-28 MED ORDER — HYDROMORPHONE HCL 1 MG/ML IJ SOLN
INTRAMUSCULAR | Status: AC
  Filled 2018-03-28: qty 1

## 2018-03-28 MED ORDER — ONDANSETRON HCL 4 MG/2ML IJ SOLN
INTRAMUSCULAR | Status: DC | PRN
  Administered 2018-03-28 (×2): 2 mg via INTRAVENOUS

## 2018-03-28 MED ORDER — ROCURONIUM BROMIDE 50 MG/5ML IV SOSY
PREFILLED_SYRINGE | INTRAVENOUS | Status: DC | PRN
  Administered 2018-03-28: 10 mg via INTRAVENOUS
  Administered 2018-03-28: 50 mg via INTRAVENOUS

## 2018-03-28 MED ORDER — HYDROCODONE-ACETAMINOPHEN 10-325 MG PO TABS: 1.0000 | ORAL_TABLET | Freq: Four times a day (QID) | ORAL | 0 refills | Status: DC | PRN

## 2018-03-28 MED ORDER — LIDOCAINE 2% (20 MG/ML) 5 ML SYRINGE
INTRAMUSCULAR | Status: AC
  Filled 2018-03-28: qty 5

## 2018-03-28 MED ORDER — FENTANYL CITRATE (PF) 100 MCG/2ML IJ SOLN
INTRAMUSCULAR | Status: DC | PRN
  Administered 2018-03-28 (×5): 50 ug via INTRAVENOUS

## 2018-03-28 MED ORDER — PROPOFOL 10 MG/ML IV BOLUS
INTRAVENOUS | Status: DC | PRN
  Administered 2018-03-28 (×2): 20 mg via INTRAVENOUS
  Administered 2018-03-28: 150 mg via INTRAVENOUS
  Administered 2018-03-28: 50 mg via INTRAVENOUS

## 2018-03-28 MED ORDER — BUPIVACAINE-EPINEPHRINE (PF) 0.25% -1:200000 IJ SOLN
INTRAMUSCULAR | Status: AC
  Filled 2018-03-28: qty 30

## 2018-03-28 MED ORDER — LACTATED RINGERS IV SOLN
INTRAVENOUS | Status: DC
  Administered 2018-03-28: 09:00:00 via INTRAVENOUS

## 2018-03-28 SURGICAL SUPPLY — 39 items
APL SKNCLS STERI-STRIP NONHPOA (GAUZE/BANDAGES/DRESSINGS) ×2
BENZOIN TINCTURE PRP APPL 2/3 (GAUZE/BANDAGES/DRESSINGS) ×3 IMPLANT
BLADE SURG 15 STRL LF DISP TIS (BLADE) ×2 IMPLANT
BLADE SURG 15 STRL SS (BLADE) ×3
BRIEF STRETCH FOR OB PAD LRG (UNDERPADS AND DIAPERS) ×3 IMPLANT
CONT SPEC 4OZ CLIKSEAL STRL BL (MISCELLANEOUS) ×3 IMPLANT
COVER SURGICAL LIGHT HANDLE (MISCELLANEOUS) ×3 IMPLANT
DECANTER SPIKE VIAL GLASS SM (MISCELLANEOUS) ×3 IMPLANT
DRAPE LAPAROTOMY T 102X78X121 (DRAPES) ×3 IMPLANT
DRSG PAD ABDOMINAL 8X10 ST (GAUZE/BANDAGES/DRESSINGS) ×3 IMPLANT
ELECT PENCIL ROCKER SW 15FT (MISCELLANEOUS) ×3 IMPLANT
ELECT REM PT RETURN 15FT ADLT (MISCELLANEOUS) ×3 IMPLANT
GAUZE 4X4 16PLY RFD (DISPOSABLE) ×3 IMPLANT
GAUZE SPONGE 4X4 12PLY STRL (GAUZE/BANDAGES/DRESSINGS) ×3 IMPLANT
GLOVE ECLIPSE 8.0 STRL XLNG CF (GLOVE) ×3 IMPLANT
GLOVE INDICATOR 8.0 STRL GRN (GLOVE) ×3 IMPLANT
GOWN STRL REUS W/TWL XL LVL3 (GOWN DISPOSABLE) ×6 IMPLANT
KIT BASIN OR (CUSTOM PROCEDURE TRAY) ×3 IMPLANT
LOOP VESSEL MAXI BLUE (MISCELLANEOUS) IMPLANT
LUBRICANT JELLY K Y 4OZ (MISCELLANEOUS) ×3 IMPLANT
NEEDLE HYPO 22GX1.5 SAFETY (NEEDLE) ×3 IMPLANT
PACK BASIC VI WITH GOWN DISP (CUSTOM PROCEDURE TRAY) ×3 IMPLANT
SHEARS HARMONIC 9CM CVD (BLADE) IMPLANT
SUCTION FRAZIER HANDLE 12FR (TUBING)
SUCTION TUBE FRAZIER 12FR DISP (TUBING) IMPLANT
SUT CHROMIC 2 0 SH (SUTURE) ×4 IMPLANT
SUT CHROMIC 3 0 SH 27 (SUTURE) IMPLANT
SUT VIC AB 2-0 SH 27 (SUTURE)
SUT VIC AB 2-0 SH 27X BRD (SUTURE) IMPLANT
SUT VIC AB 2-0 UR6 27 (SUTURE) ×19 IMPLANT
SWAB COLLECTION DEVICE MRSA (MISCELLANEOUS) IMPLANT
SWAB CULTURE ESWAB REG 1ML (MISCELLANEOUS) IMPLANT
SYR 20CC LL (SYRINGE) ×3 IMPLANT
SYR 3ML LL SCALE MARK (SYRINGE) IMPLANT
SYR 5ML LL (SYRINGE) IMPLANT
SYR BULB IRRIGATION 50ML (SYRINGE) ×1 IMPLANT
TOWEL OR 17X26 10 PK STRL BLUE (TOWEL DISPOSABLE) ×3 IMPLANT
TOWEL OR NON WOVEN STRL DISP B (DISPOSABLE) ×3 IMPLANT
YANKAUER SUCT BULB TIP 10FT TU (MISCELLANEOUS) ×3 IMPLANT

## 2018-03-28 NOTE — Anesthesia Preprocedure Evaluation (Signed)
Anesthesia Evaluation  Patient identified by MRN, date of birth, ID band Patient awake    Reviewed: Allergy & Precautions, NPO status , Patient's Chart, lab work & pertinent test results, reviewed documented beta blocker date and time   Airway Mallampati: II  TM Distance: >3 FB Neck ROM: Full    Dental  (+) Dental Advisory Given   Pulmonary COPD, former smoker,    breath sounds clear to auscultation       Cardiovascular hypertension, Pt. on medications and Pt. on home beta blockers + CAD and + Peripheral Vascular Disease   Rhythm:Regular Rate:Normal     Neuro/Psych PSYCHIATRIC DISORDERS Anxiety Depression Bipolar Disorder  Neuromuscular disease    GI/Hepatic Neg liver ROS, GERD  ,  Endo/Other  Hypothyroidism   Renal/GU negative Renal ROS     Musculoskeletal  (+) Arthritis , Fibromyalgia -, narcotic dependent  Abdominal   Peds  Hematology  (+) anemia ,   Anesthesia Other Findings   Reproductive/Obstetrics                             Lab Results  Component Value Date   WBC 7.9 03/21/2018   HGB 12.9 03/21/2018   HCT 39.5 03/21/2018   MCV 91.9 03/21/2018   PLT 264 03/21/2018   Lab Results  Component Value Date   CREATININE 0.78 03/21/2018   BUN 16 03/21/2018   NA 137 03/21/2018   K 4.2 03/21/2018   CL 99 (L) 03/21/2018   CO2 27 03/21/2018    Anesthesia Physical  Anesthesia Plan  ASA: III  Anesthesia Plan: General   Post-op Pain Management:    Induction: Intravenous  PONV Risk Score and Plan: 3 and Ondansetron and Dexamethasone  Airway Management Planned: Oral ETT  Additional Equipment:   Intra-op Plan:   Post-operative Plan: Extubation in OR  Informed Consent: I have reviewed the patients History and Physical, chart, labs and discussed the procedure including the risks, benefits and alternatives for the proposed anesthesia with the patient or authorized  representative who has indicated his/her understanding and acceptance.   Dental advisory given  Plan Discussed with:   Anesthesia Plan Comments:         Anesthesia Quick Evaluation

## 2018-03-28 NOTE — Op Note (Signed)
03/28/2018  1:38 PM  PATIENT:  [name]  67 y.o. female  Patient Care Team: McDiarmid, Blane Ohara, MD as PCP - General Care, [facility] as Consulting Physician (Pain Medicine) Susa Day, MD as Consulting Physician (Orthopedic Surgery) Kennith Center, RD as Dietitian (Family Medicine) Michael Boston, MD as Consulting Physician (General Surgery)  PRE-OPERATIVE DIAGNOSIS:    Anal fissure refractory to medical management Grade 2-3 internal hemorrhoids  External hemorrhoids  POST-OPERATIVE DIAGNOSIS:    Anal fissure refractory to medical management Grade 2-3 internal hemorrhoids  External hemorrhoids  PROCEDURE:    Lateral internal partial sphincterotomy HEMORRHOIDAL LIGATION & PEXY EXTERNAL HEMORRHOIDECTOMY X 3 ANORECTAL EXAM UNDER ANESTHESIA   SURGEON:  Adin Hector, MD  ASSISTANT: RN   ANESTHESIA:   Local field block Anorectal block General  0.25% bupivacaine with epinephrine at the beginning of the case.  Liposomal bupivacaine (Experel) at the end of the case.  EBL:  Total I/O In: 200 [I.V.:200] Out: -   Delay start of Pharmacological VTE agent (>24hrs) due to surgical blood loss or risk of bleeding:  no  DRAINS: none   SPECIMEN:  Source of Specimen:  Internal/external hemorrhoids x 3   DISPOSITION OF SPECIMEN:  PATHOLOGY  COUNTS:  YES  PLAN OF CARE: Discharge to home after PACU  PATIENT DISPOSITION:  PACU - hemodynamically stable.  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  Also persistent hemorrhoidal symptoms of discomfort and pain.  External piles as well as some inflamed internal piles.  I recommended examination and surgical treatment.  :  The anatomy & physiology of the anorectal region was discussed.  The pathophysiology of anal fissure and differential diagnosis was discussed.  Natural history progression  was discussed.   I stressed the importance of a bowel  regimen to have daily soft bowel movements to minimize progression of disease.     The patient's condition is not adequately controlled.  Non-operative treatment has not healed the fissure.  Therefore, I recommended examination under anesthesia for better examination to confirm the diagnosis and treat by lateral internal sphincterotomy to relax the spasm better & allow the fissure to heal.  Technique, benefits, alternatives were discussed.   I noted a good likelihood this will help address the problem.  Risks such as bleeding, pain, incontinence, recurrence, heart attack, death, and other risks were discussed.      The patient's symptoms are not adequately controlled by medicines and other non-operative treatments.  I feel the risks & problems of no surgery outweigh the operative risks; therefore, I recommended surgery to treat the hemorrhoids by ligation, pexy, and possible resection.ining the pathology, treatment options, and bowel regimen were given as well.  The patient expressed understanding & wishes to proceed with surgery.  OR FINDINGS: Patient had a posterior midline chronic anal fissure with a hypertensive sphincter.    Sphincterotomy location:  Left lateral anal canal.  60% distal internal sphincterotomy performed  Enlarged right posterior (Grade 3) > left lateral (Grade 3) > right anterior (Grade 2)  internal hemorrhoids with significant external components.  Internal hemorrhoidal ligation done.  Hemorrhoidectomy of persistent external tissue on all 3 piles.  IDESCRIPTION:   Informed consent was confirmed. Patient underwent general anesthesia without difficulty. Patient was placed into prone positioning.  The perianal region was prepped and draped in sterile fashion. Surgical timeout confirmed or plan.  I did digital rectal examination and then transitioned over to anoscopy to  get a sense of the anatomy.  I identified an anal fissure in the posterior midline anal canal.  The sphincter  tone was increased.  No stricture.  No abscess located.  No fistula   I went ahead and proceeded with internal sphinterotmy technique.   I used a 2-0 Vicryl suture on a UR-6 needle in a figure-of-eight fashion 6 cm proximal to the anal verge to internally ligate and pexied the left lateral pile.  Then, I came through the anoderm of the left lateral anal canal longitudinally.  I identified the internal and external sphincters.  I dissected & elevated the internal sphincter.  I proceeded with a partial internal sphincterotomy starting distally and moving proximally using cautery.  This involved the distal 60% thickness.  This provided improved relaxation of the anal sphincter.  I proceeded to do hemorrhoidal ligation and pexy.  I used a 2-0 Vicryl suture on a UR-6 needle in a figure-of-eight fashion 6 cm proximal to the anal verge.  I neuropil.  I therefore transition to the largest right posterior hemorrhoid pile.  Because of redundant hemorrhoidal tissue too bulky to merely ligate or pexy, I excised the excess internal hemorrhoid piles longitudinally in a fusiform biconcave fashion, at the  right anterior, right posterior and left lateral locations, sparing the anal canal to avoid narrowing.  I then ran that stitch longitudinally more distally to close the hemorrhoidectomy wound to the anal verge over a Parks self retaining retractor & occasionally a large Hill-Furgeson retractor to avoid narrowing of the anal canal.  I then tied that stitch down to cause a hemorrhoidopexy.   I then did hemorrhoidal ligation and pexy at the other 3 columns.  At the completion of this, all 6 anorectal columns were ligated and pexied in the classic hexagonal fashion (right anterior/lateral/posterior, left anterior/lateral/posterior).  I closed the external part of the hemorrhoidectomy wounds with interrupted horizontal mattress 2-0 chromic suture, leaving the last 5 mm open to allow natural drainage.    I redid anoscopy &  examination.  At completion of this, all hemorrhoids had been removed or reduced into the rectum.  I excised a posterior midline sentinel tag at the anal fissure.  Make sure there is no persistent scar tissue.  There is no more prolapse.  Internal & external anatomy was more more normal.  Hemostasis was good.  Fluffed gauze was on-laid over the perianal region.  No packing done.  Patient is being extubated go to go to the recovery room.  I had discussed postop care in detail with the patient & her daughter in the preop holding area.  Instructions for post-operative recovery and prescriptions are written. I discussed operative findings, updated the patient's status, discussed probable steps to recovery, and gave postoperative recommendations to the patient's family.  Recommendations were made.  Questions were answered.  He expressed understanding & appreciation.  Adin Hector, M.D., F.A.C.S. Gastrointestinal and Minimally Invasive Surgery [facility] [address] [place],  [address] [phone] Main / Paging

## 2018-03-28 NOTE — Progress Notes (Signed)
Report to Ned Card, RN

## 2018-03-28 NOTE — Anesthesia Postprocedure Evaluation (Signed)
Anesthesia Post Note  Patient: [name]  Procedure(s) Performed: ANORECTAL EXAM UNDER ANESTHESIA lateral internal partial sphincterotomy, hemorrhoidal ligation pixie, HEMORRHOIDECTOMY (N/A Anus) ANAL SPHINCTEROTOMY (N/A )     Patient location during evaluation: PACU Anesthesia Type: General Level of consciousness: sedated and patient cooperative Pain management: pain level controlled Vital Signs Assessment: post-procedure vital signs reviewed and stable Respiratory status: spontaneous breathing Cardiovascular status: stable Anesthetic complications: no    Last Vitals:  Vitals:   03/28/18 1511 03/28/18 1600  BP: 106/79 (!) 147/84  Pulse: 66   Resp: 16 18  Temp:  36.4 C  SpO2: 90% 96%    Last Pain:  Vitals:   03/28/18 1600  TempSrc:   PainSc: 2                  Nolon Nations

## 2018-03-28 NOTE — H&P (Signed)
[name] DOB: 1951/06/24 Divorced / Language: [name] / Race: White Female   History of Present Illness [name]; 10/23/2017 1:11 PM) The patient is a 67 year old female who presents with anal pain. Note for "Anal pain": ` ` ` Patient sent for surgical consultation at the request of [name]  Chief Complaint: Anal pain. Fissure versus hemorrhoids.  The patient is a pleasant obese female with fibromyalgia and bipolar disorder. On chronic narcotics for chronic pain. With hemorrhoid issues for many years. After self disimpact and do enemas. Gout on MiraLAX. She uses at every day. She moves her bowels every morning wound. Usually it is soft and regular. Feels some outside hemorrhoids. Usually they have not been too bothersome. Did have hemorrhoid surgery 1995. No other treatments or interventions since. She recalls having history of colon polyps. Apparently one of her sons had polyps at an early age as well. She's had some financial & unemployment struggles through the years. She used to be followed by [facility] gastroenterology but is no longer with them due to financial issues I believe. She is trying to reestablish with a different gastroenterology group for follow-up colonoscopies. She thinks she is about due. Not having any rectal bleeding.  However she had episode of sharp anal pain. Concern for irritated hemorrhoids. Placed on Proctofoam by her primary care physician. Urgently faded in our office to rule out thrombosed hemorrhoid. There was no evidence of that. Suspicion of possible fissure. She did not really have increased sphincter tone. Was placed on suppositories. Colorectal follow-up recommended. She comes in a week later. Then using the Anusol hydrocortisone suppositories every day. She feels like that helps if she uses it. She cannot afford Analpram very well so she is held off. He does little blood when she wipes. No major rectal  bleeding. Does not feel like things have resolved though. She spent an extra 20 minutes talking about her other health issues. Eventually I was able to redirect her to focus on her anorectal issues.  We have been trying to manage this nonoperatively with diltiazem gel for her fissure and other treatments for hemorrhoids.  Has not had adequate healing or control.  She hold off on surgical intervention but is now ready to reconsider.  (Review of systems as stated in this history (HPI) or in the review of systems. Otherwise all other 12 point ROS are negative)   Allergies ([name], CMA; 10/23/2017 10:29 AM) ARIPiprazole *ANTIPSYCHOTICS/ANTIMANIC AGENTS*  Diclofenac-Misoprostol *ANALGESICS - ANTI-INFLAMMATORY*  Emsam *ANTIDEPRESSANTS*  Estradiol *ESTROGENS*  Ibuprofen *ANALGESICS - ANTI-INFLAMMATORY*   Medication History ([name], CMA; 10/23/2017 10:31 AM) Hydrocortisone (25MG  Suppository, Rectal) Active. Pramoxine HCl (1% Foam, Rectal) Active. Aspirin (81MG  Tablet Chewable, Oral) Active. Calcium Carbonate (600MG  Tablet, Oral) Active. Citalopram Hydrobromide (40MG  Tablet, Oral) Active. Dicyclomine HCl (10MG  Capsule, Oral) Active. Fexofenadine HCl (180MG  Tablet, Oral) Active. HydroDiuril (100MG  Tablet, Oral) Active. HydrOXYzine HCl (50MG  Tablet, Oral) Active. Potassium Chloride ER (10MEQ Capsule ER, Oral) Active. Synthroid (50MCG Tablet, Oral) Active. Ativan (1MG  Tablet, Oral) Active. Metoprolol Tartrate (50MG  Tablet, Oral) Active. Medications Reconciled  Vitals ([name] CMA; 10/23/2017 10:31 AM) 10/23/2017 10:31 AM Weight: 207.31 lb Height: 64in Body Surface Area: 1.99 m Body Mass Index: 35.58 kg/m  Temp.: 53F(Oral)  Pulse: 54 (Regular)  P.OX: 96% (Room air) BP: 140/78 (Sitting, Left Arm, Standard)  BP (!) 148/92   Pulse 61   Temp 97.8 F (36.6 C) (Oral)   Resp 18   Ht 5' 3.5" (1.613 m)  Wt 107.5 kg (237 lb)   SpO2  97%   BMI 41.32 kg/m       Physical Exam [name]; 10/23/2017 11:21 AM) General Mental Status-Alert. General Appearance-Not in acute distress, Not Sickly. Orientation-Oriented X3. Hydration-Well hydrated. Voice-Normal.  Integumentary Global Assessment Upon inspection and palpation of skin surfaces of the - Axillae: non-tender, no inflammation or ulceration, no drainage. and Distribution of scalp and body hair is normal. General Characteristics Temperature - normal warmth is noted.  Head and Neck Head-normocephalic, atraumatic with no lesions or palpable masses. Face Global Assessment - atraumatic, no absence of expression. Neck Global Assessment - no abnormal movements, no bruit auscultated on the right, no bruit auscultated on the left, no decreased range of motion, non-tender. Trachea-midline. Thyroid Gland Characteristics - non-tender.  Eye Eyeball - Left-Extraocular movements intact, No Nystagmus. Eyeball - Right-Extraocular movements intact, No Nystagmus. Cornea - Left-No Hazy. Cornea - Right-No Hazy. Sclera/Conjunctiva - Left-No scleral icterus, No Discharge. Sclera/Conjunctiva - Right-No scleral icterus, No Discharge. Pupil - Left-Direct reaction to light normal. Pupil - Right-Direct reaction to light normal.  ENMT Ears Pinna - Left - no drainage observed, no generalized tenderness observed. Right - no drainage observed, no generalized tenderness observed. Nose and Sinuses External Inspection of the Nose - no destructive lesion observed. Inspection of the nares - Left - quiet respiration. Right - quiet respiration. Mouth and Throat Lips - Upper Lip - no fissures observed, no pallor noted. Lower Lip - no fissures observed, no pallor noted. Nasopharynx - no discharge present. Oral Cavity/Oropharynx - Tongue - no dryness observed. Oral Mucosa - no cyanosis observed. Hypopharynx - no evidence of airway distress  observed.  Chest and Lung Exam Inspection Movements - Normal and Symmetrical. Accessory muscles - No use of accessory muscles in breathing. Palpation Palpation of the chest reveals - Non-tender. Auscultation Breath sounds - Normal and Clear.  Cardiovascular Auscultation Rhythm - Regular. Murmurs & Other Heart Sounds - Auscultation of the heart reveals - No Murmurs and No Systolic Clicks.  Abdomen Inspection Inspection of the abdomen reveals - No Visible peristalsis and No Abnormal pulsations. Umbilicus - No Bleeding, No Urine drainage. Palpation/Percussion Palpation and Percussion of the abdomen reveal - Soft, Non Tender, No Rebound tenderness, No Rigidity (guarding) and No Cutaneous hyperesthesia. Note: Abdomen soft. Not severely distended. No distasis recti. Small umbilical hernia present on Valsalva only. Not bothersome No other anterior abdominal wall hernias   Female Genitourinary Sexual Maturity Tanner 5 - Adult hair pattern. Note: No vaginal bleeding nor discharge   Rectal Note: Posterior midline fissure. Most sensitive area of discomfort. Increased sphincter tone. Can barely tolerate digital exam.  Right anterior left lateral partially prolapsed grade 3/4 hemorrhoids. External hemorrhoidal tags. Right posterior not as enlarged. No thrombosis. I feel no obvious rectal masses. No abscess. No fistula. No warts. No pilonidal disease. No pruritus.   Peripheral Vascular Upper Extremity Inspection - Left - No Cyanotic nailbeds, Not Ischemic. Right - No Cyanotic nailbeds, Not Ischemic.  Neurologic Neurologic evaluation reveals -normal attention span and ability to concentrate, able to name objects and repeat phrases. Appropriate fund of knowledge , normal sensation and normal coordination. Mental Status Affect - not angry, not paranoid. Cranial Nerves-Normal Bilaterally. Gait-Normal.  Neuropsychiatric Mental status exam performed with findings  of-able to articulate well with normal speech/language, rate, volume and coherence, thought content normal with ability to perform basic computations and apply abstract reasoning and no evidence of hallucinations, delusions, obsessions or homicidal/suicidal ideation. Note: Pleasant.  Very open & chatty.   Musculoskeletal Global Assessment Spine, Ribs and Pelvis - no instability, subluxation or laxity. Right Upper Extremity - no instability, subluxation or laxity.  Lymphatic Head & Neck  General Head & Neck Lymphatics: Bilateral - Description - No Localized lymphadenopathy. Axillary  General Axillary Region: Bilateral - Description - No Localized lymphadenopathy. Femoral & Inguinal  Generalized Femoral & Inguinal Lymphatics: Left - Description - No Localized lymphadenopathy. Right - Description - No Localized lymphadenopathy.    Assessment & Plan ANAL FISSURE (K60.2) Impression: Posterior midline fissure. Seems partially healed, but obvious source of pain.Marland Kitchen Sphincter tone not severely increased, but she is very sensitive there. I suspect this is the main problem area for her.  Do trial of diltiazem cream and see if it will heal on its own. Otherwise, may require examination under anesthesia with partial internal sphincterotomy. Would do hemorrhoidectomies at that time as well since she seems to have chronic issues.  Addendum: patient with recurrent anal pain and fissure.  Painful hemorrhoids.  Recommended an examination under anesthesia with probable hemorrhoidectomy.  She hold off fora  few months but now is ready to reconsider surgery.  PROLAPSED INTERNAL HEMORRHOIDS, GRADE 4 (K64.3) Impression: Partially prolapsed hemorrhoids. Right anterior & left lateral especially. Not thrombosed. They look rather chronic. I don't think that the anatomy will get better without an operation. Hemorrhoidal ligation/pexy/hemorrhoidectomy.  She is exhausted nonsurgical options and is  ready to proceed with surgery.    Adin Hector, M.D., F.A.C.S. Gastrointestinal and Minimally Invasive Surgery [facility] [address], [place], [place] [address] [phone] Main / Paging

## 2018-03-28 NOTE — Discharge Instructions (Signed)
ANORECTAL SURGERY:  °POST OPERATIVE INSTRUCTIONS ° °###################################################################### ° °EAT °Start with a pureed / full liquid diet °After 24 hours, gradually transition to a high fiber diet.   ° °CONTROL PAIN °Control pain so you can tolerate bowel movements,  °walk, sleep, tolerate sneezing/coughing, and go up/down stairs. ° ° °HAVE A BOWEL MOVEMENT DAILY °Keep your bowels regular to avoid problems.   °Taking a fiber supplement every day to keep bowels soft.   °Try a laxative to override constipation. °Use an antidairrheal to slow down diarrhea.   °Call if not better after 2 tries ° °WALK °Walk an hour a day.  Control your pain to do that. °  °CALL IF YOU HAVE PROBLEMS/CONCERNS °Call if you are still struggling despite following these instructions. °Call if you have concerns not answered by these instructions ° °###################################################################### ° ° ° °1. Take your usually prescribed home medications unless otherwise directed. °2. DIET: Follow a light bland diet the first 24 hours after arrival home, such as soup, liquids, crackers, etc.  Be sure to include lots of fluids daily.  Avoid fast food or heavy meals as your are more likely to get nauseated.  Eat a low fat the next few days after surgery.   °3. PAIN CONTROL: °a. Pain is best controlled by a usual combination of three different methods TOGETHER: °i. Ice/Heat °ii. Over the counter pain medication °iii. Prescription pain medication °b. Expect swelling and discomfort in the anus/rectal area.  Warm water baths (30-60 minutes up to 6 times a day, especially after bowel meovements) will help. Use ice for the first few days to help decrease swelling and bruising, then switch to heat such as warm towels, sitz baths, warm baths, etc to help relax tight/sore spots and speed recovery.  Some people prefer to use ice alone, heat alone, alternating between ice & heat.  Experiment to what works  for you.   °c. It is helpful to take an over-the-counter pain medication continuously for the first few weeks.  Choose one of the following that works best for you: °i. Naproxen (Aleve, etc)  Two 220mg tabs twice a day °ii. Ibuprofen (Advil, etc) Three 200mg tabs four times a day (every meal & bedtime) °iii. Acetaminophen (Tylenol, etc) 500-650mg four times a day (every meal & bedtime) °d. A  prescription for pain medication (such as oxycodone, hydrocodone, etc) should be given to you upon discharge.  Take your pain medication as prescribed.  °i. If you are having problems/concerns with the prescription medicine (does not control pain, nausea, vomiting, rash, itching, etc), please call us [phone] to see if we need to switch you to a different pain medicine that will work better for you and/or control your side effect better. °ii. If you need a refill on your pain medication, please contact your pharmacy.  They will contact our office to request authorization. Prescriptions will not be filled after 5 pm or on week-ends.  If can take up to 48 hours for it to be filled & ready so avoid waiting until you are down to thel ast pill. °e. A topical cream (Dibucaine) or a prescription for a cream (such as diltiazem 2% gel) may be given to you.  Many people find relief with topical creams.  Some people find it burns too much.  Experiment.  If it helps, use it.  If it burns, don't using it. ° °Use a Sitz Bath 4-8 times a day for relief ° ° °Sitz Bath °A sitz bath   is a warm water bath taken in the sitting position that covers only the hips and buttocks. It may be used for either healing or hygiene purposes. Sitz baths are also used to relieve pain, itching, or muscle spasms. The water may contain medicine. Moist heat will help you heal and relax.  °HOME CARE INSTRUCTIONS  °Take 3 to 4 sitz baths a day. °1. Fill the bathtub half full with warm water. °2. Sit in the water and open the drain a little. °3. Turn on the warm  water to keep the tub half full. Keep the water running constantly. °4. Soak in the water for 15 to 20 minutes. °5. After the sitz bath, pat the affected area dry first. ° ° °4. KEEP YOUR BOWELS REGULAR °a. The goal is one soft bowel movement a day °b. Avoid getting constipated.  Between the surgery and the pain medications, it is common to experience some constipation.  Increasing fluid intake and taking a fiber supplement (such as Metamucil, Citrucel, FiberCon, MiraLax, etc) 2-3 times a day regularly will usually help prevent this problem from occurring.  A mild laxative (prune juice, Milk of Magnesia, MiraLax, etc) should be taken according to package directions if there are no bowel movements after 48 hours. °c. Watch out for diarrhea.  If you have many loose bowel movements, simplify your diet to bland foods & liquids for a few days.  Stop any stool softeners and decrease your fiber supplement.  Switching to mild anti-diarrheal medications (Kayopectate, Pepto Bismol) can help.  Can try an imodium/loperamide dose.  If this worsens or does not improve, please call us. ° °5. Wound Care ° °a. Remove your bandages with your first bowel movement, usually the day after surgery.  You may have packing if you had an abscess.  Let any packing or gauze fall come out.   °b. Wear an absorbent pad or soft cotton balls in your underwear as needed to catch any drainage and help keep the area  °c. Keep the area clean and dry.  Bathe / shower every day.  Keep the area clean by showering / bathing over the incision / wound.   It is okay to soak an open wound to help wash it.  Consider using a squeeze bottle filled with warm water to gently wash the anal area.  Wet wipes or showers / gentle washing after bowel movements is often less traumatic than regular toilet paper. °d. You will often notice bleeding with bowel movements.  This should slow down by the end of the first week of surgery.  Sitting on an ice pack can  help. °e. Expect some drainage.  This should slow down by the end of the first week of surgery, but you will have occasional bleeding or drainage up to a few months after surgery.  Wear an absorbent pad or soft cotton gauze in your underwear until the drainage stops. ° °6. ACTIVITIES as tolerated:   °a. You may resume regular (light) daily activities beginning the next day--such as daily self-care, walking, climbing stairs--gradually increasing activities as tolerated.  If you can walk 30 minutes without difficulty, it is safe to try more intense activity such as jogging, treadmill, bicycling, low-impact aerobics, swimming, etc. °b. Save the most intensive and strenuous activity for last such as sit-ups, heavy lifting, contact sports, etc  Refrain from any heavy lifting or straining until you are off narcotics for pain control.   °c. DO NOT PUSH THROUGH PAIN.  Let pain   be your guide: If it hurts to do something, don't do it.  Pain is your body warning you to avoid that activity for another week until the pain goes down. d. You may drive when you are no longer taking prescription pain medication, you can comfortably sit for long periods of time, and you can safely maneuver your car and apply brakes. e. Dennis Bast may have sexual intercourse when it is comfortable.  7. FOLLOW UP in our office a. Please call [facility] at [phone] to set up an appointment to see your surgeon in the office for a follow-up appointment approximately 2-3 weeks after your surgery. b. Make sure that you call for this appointment the day you arrive home to ensure a convenient appointment time.  8. IF YOU HAVE DISABILITY OR FAMILY LEAVE FORMS, BRING THEM TO THE OFFICE FOR PROCESSING.  DO NOT GIVE THEM TO YOUR DOCTOR.        WHEN TO CALL us [phone]: 1. Poor pain control 2. Reactions / problems with new medications (rash/itching, nausea, etc)  3. Fever over 101.5 F (38.5 C) 4. Inability to urinate 5. Nausea and/or  vomiting 6. Worsening swelling or bruising 7. Continued bleeding from incision. 8. Increased pain, redness, or drainage from the incision  The clinic staff is available to answer your questions during regular business hours (8:30am-5pm).  Please dont hesitate to call and ask to speak to one of our nurses for clinical concerns.   A surgeon from [facility] is always on call at the hospitals   If you have a medical emergency, go to the nearest emergency room or call 911.    [facility], [place], [place], [place], [place]  [address] ? MAIN: [phone] ? TOLL FREE: [phone] ? FAX [phone] [email or web address]   WHAT IS AN ANAL FISSURE? An anal fissure (fissure-in-ano) is a small, oval shaped tear in skin that lines the opening of the anus. Fissures typically cause severe pain and bleeding with bowel movements. Fissures are quite common in the general population, but are often confused with other causes of pain and bleeding, such as hemorrhoids.  WHAT ARE THE SYMPTOMS OF AN ANAL FISSURE? The typical symptoms of an anal fissure include severe pain during, and especially after, a bowel movement, lasting from several minutes to a few hours. Patients may also notice bright red blood from the anus that can be seen on the toilet paper or on the stool. Between bowel movements, patients with anal fissures are often relatively symptom-free. Many patients are fearful of having a bowel movement and may try to avoid defecation secondary to the pain.   WHAT CAUSES AN ANAL FISSURE? Fissures are usually caused by trauma to the inner lining of the anus. Patients with tight anal sphincter muscles (i.e., increased muscle tone) are more prone to developing anal fissures. A hard, dry bowel movement is typically responsible, but loose stools and diarrhea can also be the cause. Following a bowel movement, severe anal pain can produce spasm of the anal  sphincter muscle, resulting in a decrease in blood flow to the site of the injury, thus impairing healing of the wound. The next bowel movement results in more pain, anal spasm, decreased blood flow to the area, and the cycle continues. Treatments are aimed at interrupting this cycle by relaxing the anal sphincter muscle to promote healing of the fissure.   Other, less common, causes include inflammatory conditions and certain anal infections or tumors.  Anal fissures may be acute (recent onset) or chronic (present for a long period of time). Chronic fissures may be more difficult to treat, and may also have an external lump associated with the tear, called a sentinel pile or skin tag, as well as extra tissue just inside the anal canal (hypertrophied papilla) .  WHAT IS THE TREATMENT OF ANAL FISSURES? The majority of anal fissures do not require surgery. The most common treatment for an acute anal fissure consists of making the stool more formed and bulky with a diet high in fiber and utilization of over-the-counter fiber supplementation (totaling 25-35 grams of fiber/day). Stool softeners and increasing water intake may be necessary to promote soft bowel movements and aid in the healing process. Topical anesthetics for pain and warm tub baths (sitz baths) for 10-20 minutes several times a day (especially after bowel movements) are soothing and promote relaxation of the anal muscles, which may help the healing process.  Other medications (such as diltiazem) may be prescribed that allow relaxation of the anal sphincter muscles. Your surgeon will go over benefits and side-effects of each of these with you. Narcotic pain medications are not recommended for anal fissures, as they promote constipation. Chronic fissures are generally more difficult to treat, and your surgeon may advise surgical treatment.  WILL THE PROBLEM RETURN? Fissures can recur easily, and it is quite common for a fully healed fissure to  recur after a hard bowel movement or other trauma. Even when the pain and bleeding have subsided, it is very important to continue good bowel habits and a diet high in fiber as a lifestyle change. If the problem returns without an obvious cause, further assessment is warranted.  GETTING TO GOOD BOWEL HEALTH. Irregular bowel habits such as constipation and diarrhea can lead to many problems over time.  Having one soft bowel movement a day is the most important way to prevent further problems.  The anorectal canal is designed to handle stretching and feces to safely manage our ability to get rid of solid waste (feces, poop, stool) out of our body.  BUT, hard constipated stools can act like ripping concrete bricks and diarrhea can be a burning fire to this very sensitive area of our body, causing inflamed hemorrhoids, anal fissures, increasing risk is perirectal abscesses, abdominal pain/bloating, an making irritable bowel worse.     The goal: ONE SOFT BOWEL MOVEMENT A DAY!  To have soft, regular bowel movements:   Drink at least 8 tall glasses of water a day.    Take plenty of fiber.  Fiber is the undigested part of plant food that passes into the colon, acting s natures broom to encourage bowel motility and movement.  Fiber can absorb and hold large amounts of water. This results in a larger, bulkier stool, which is soft and easier to pass. Work gradually over several weeks up to 6 servings a day of fiber (25g a day even more if needed) in the form of: o Vegetables -- Root (potatoes, carrots, turnips), leafy green (lettuce, salad greens, celery, spinach), or cooked high residue (cabbage, broccoli, etc) o Fruit -- Fresh (unpeeled skin & pulp), Dried (prunes, apricots, cherries, etc ),  or stewed ( applesauce)  o Whole grain breads, pasta, etc (whole wheat)  o Bran cereals   Bulking Agents -- This type of water-retaining fiber generally is easily obtained each day by one of the following:  o Psyllium  bran -- The psyllium plant is remarkable because its ground seeds  can retain so much water. This product is available as Metamucil, Konsyl, Effersyllium, Per Diem Fiber, or the less expensive generic preparation in drug and health food stores. Although labeled a laxative, it really is not a laxative.  o Methylcellulose -- This is another fiber derived from wood which also retains water. It is available as Citrucel. o Polyethylene Glycol - and artificial fiber commonly called Miralax or Glycolax.  It is helpful for people with gassy or bloated feelings with regular fiber o Flax Seed - a less gassy fiber than psyllium  No reading or other relaxing activity while on the toilet. If bowel movements take longer than 5 minutes, you are too constipated  AVOID CONSTIPATION.  High fiber and water intake usually takes care of this.  Sometimes a laxative is needed to stimulate more frequent bowel movements, but   Laxatives are not a good long-term solution as it can wear the colon out. o Osmotics (Milk of Magnesia, Fleets phosphosoda, Magnesium citrate, MiraLax, GoLytely) are safer than  o Stimulants (Senokot, Castor Oil, Dulcolax, Ex Lax)    o Do not take laxatives for more than 7days in a row.   IF SEVERELY CONSTIPATED, try a Bowel Retraining Program: o Do not use laxatives.  o Eat a diet high in roughage, such as bran cereals and leafy vegetables.  o Drink six (6) ounces of prune or apricot juice each morning.  o Eat two (2) large servings of stewed fruit each day.  o Take one (1) heaping tablespoon of a psyllium-based bulking agent twice a day. Use sugar-free sweetener when possible to avoid excessive calories.  o Eat a normal breakfast.  o Set aside 15 minutes after breakfast to sit on the toilet, but do not strain to have a bowel movement.  o If you do not have a bowel movement by the third day, use an enema and repeat the above steps.   Controlling diarrhea o Switch to liquids and simpler foods  for a few days to avoid stressing your intestines further. o Avoid dairy products (especially milk & ice cream) for a short time.  The intestines often can lose the ability to digest lactose when stressed. o Avoid foods that cause gassiness or bloating.  Typical foods include beans and other legumes, cabbage, broccoli, and dairy foods.  Every person has some sensitivity to other foods, so listen to our body and avoid those foods that trigger problems for you. o Adding fiber (Citrucel, Metamucil, psyllium, Miralax) gradually can help thicken stools by absorbing excess fluid and retrain the intestines to act more normally.  Slowly increase the dose over a few weeks.  Too much fiber too soon can backfire and cause cramping & bloating. o Probiotics (such as active yogurt, Align, etc) may help repopulate the intestines and colon with normal bacteria and calm down a sensitive digestive tract.  Most studies show it to be of mild help, though, and such products can be costly. o Medicines: - Bismuth subsalicylate (ex. Kayopectate, Pepto Bismol) every 30 minutes for up to 6 doses can help control diarrhea.  Avoid if pregnant. - Loperamide (Immodium) can slow down diarrhea.  Start with two tablets (4mg  total) first and then try one tablet every 6 hours.  Avoid if you are having fevers or severe pain.  If you are not better or start feeling worse, stop all medicines and call your doctor for advice o Call your doctor if you are getting worse or not better.  Sometimes further testing (  cultures, endoscopy, X-ray studies, bloodwork, etc) may be needed to help diagnose and treat the cause of the diarrhea. o   WHAT CAN BE DONE IF THE FISSURE DOES NOT HEAL? A fissure that fails to respond to conservative measures should be re-examined. Persistent hard or loose bowel movements, scarring, or spasm of the internal anal muscle all contribute to delayed healing. Other medical problems such as inflammatory bowel disease (Crohns  disease), infections, or anal tumors can cause symptoms similar to anal fissures. Patients suffering from persistent anal pain should be examined to exclude these symptoms. This may include a colonoscopy or an exam in the operating room under anesthesia.  WHAT DOES SURGERY INVOLVE? Surgical options for treating anal fissure include Botulinum toxin (Botox) injection into the anal sphincter and surgical division of a portion of the internal anal sphincter (lateral internal sphincterotomy). Both of these are performed typically as outpatient, same-day procedures, or occasionally in the office setting. The goal of these surgical options is to promote relaxation of the anal sphincter, thereby decreasing anal pain and spasm, allowing the fissure to heal. Botox injection results in healing in 50-80% of patients, while sphincterotomy is reported to be over 90% successful. If a sentinel pile is present, it may be removed to promote healing of the fissure. All surgical procedures carry some risk, and a sphincterotomy can rarely interfere with ones ability to control gas and stool. Your colon and rectal surgeon will discuss these risks with you to determine the appropriate treatment for your particular situation.  HOW LONG IS THE RECOVERY AFTER SURGERY? It is important to note that complete healing with both medical and surgical treatments can take up to approximately 6-10 weeks. However, acute pain after surgery often disappears after a few days. Most patients will be able to return to work and resume daily activities in a few short days after the surgery.  CAN FISSURES LEAD TO COLON CANCER? Absolutely not. Persistent symptoms, however, need careful evaluation since other conditions other than an anal fissure can cause similar symptoms. Your colon and rectal surgeon may request additional tests, even if your fissure has successfully healed. A colonoscopy may be required to exclude other causes of rectal  bleeding.   HEMORRHOIDS  The rectum is the last foot of your colon, and it naturally stretches to hold stool.  Hemorrhoidal piles are natural clusters of blood vessels that help the rectum and anal canal stretch to hold stool and allow bowel movements to eliminate feces.   Hemorrhoids are abnormally swollen blood vessels in the rectum.  Too much pressure in the rectum causes hemorrhoids by forcing blood to stretch and bulge the walls of the veins, sometimes even rupturing them.  Hemorrhoids can become like varicose veins you might see on a person's legs.  Most people will develop a flare of hemorrhoids in their lifetime.  When bulging hemorrhoidal veins are irritated, they can swell, burn, itch, cause pain, and bleed.  Most flares will calm down gradually own within a few weeks.  However, once hemorrhoids are created, they are difficult to get rid of completely and tend to flare more easily than the first flare.   Fortunately, good habits and simple medical treatment usually control hemorrhoids well, and surgery is needed only in severe cases. Types of Hemorrhoids:  Internal hemorrhoids usually don't initially hurt or itch; they are deep inside the rectum and usually have no sensation. If they begin to push out (prolapse), pain and burning can occur.  However, internal hemorrhoids can  bleed.  Anal bleeding should not be ignored since bleeding could come from a dangerous source like colorectal cancer, so persistent rectal bleeding should be investigated by a doctor, sometimes with a colonoscopy.  External hemorrhoids cause most of the symptoms - pain, burning, and itching. Nonirritated hemorrhoids can look like small skin tags coming out of the anus.   Thrombosed hemorrhoids can form when a hemorrhoid blood vessel bursts and causes the hemorrhoid to suddenly swell.  A purple blood clot can form in it and become an excruciatingly painful lump at the anus. Because of these unpleasant symptoms, immediate  incision and drainage by a surgeon at an office visit can provide much relief of the pain.    PREVENTION Avoiding the most frequent causes listed below will prevent most cases of hemorrhoids: Constipation Hard stools Diarrhea  Constant sitting  Straining with bowel movements Sitting on the toilet for a long time  Severe coughing  episodes Pregnancy / Childbirth  Heavy Lifting  Sometimes avoiding the above triggers is difficult:  How can you avoid sitting all day if you have a seated job? Also, we try to avoid coughing and diarrhea, but sometimes its beyond your control.  Still, there are some practical hints to help: Keep the anal and genital area clean.  Moistened tissues such as flushable wet wipes are less irritating than toilet paper.  Using irrigating showers or bottle irrigation washing gently cleans this sensitive area.   Avoid dry toilet paper when cleaning after bowel movements.  Marland Kitchen Keep the anal and genital area dry.  Lightly pat the rectal area dry.  Avoid rubbing.  Talcum or baby powders can help GET YOUR STOOLS SOFT.   This is the most important way to prevent irritated hemorrhoids.  Hard stools are like sandpaper to the anorectal canal and will cause more problems.  The goal: ONE SOFT BOWEL MOVEMENT A DAY!  BMs from every other day to 3 times a day is a tolerable range Treat coughing, diarrhea and constipation early since irritated hemorrhoids may soon follow.  If your main job activity is seated, always stand or walk during your breaks. Make it a point to stand and walk at least 5 minutes every hour and try to shift frequently in your chair to avoid direct rectal pressure.  Always exhale as you strain or lift. Don't hold your breath.  Do not delay or try to prevent a bowel movement when the urge is present. Exercise regularly (walking or jogging 60 minutes a day) to stimulate the bowels to move. No reading or other activity while on the toilet. If bowel movements take longer than  5 minutes, you are too constipated. AVOID CONSTIPATION Drink plenty of liquids (1 1/2 to 2 quarts of water and other fluids a day unless fluid restricted for another medical condition). Liquids that contain caffeine (coffee a, tea, soft drinks) can be dehydrating and should be avoided until constipation is controlled. Consider minimizing milk, as dairy products may be constipating. Eat plenty of fiber (30g a day ideal, more if needed).  Fiber is the undigested part of plant food that passes into the colon, acting as natures broom to encourage bowel motility and movement.  Fiber can absorb and hold large amounts of water. This results in a larger, bulkier stool, which is soft and easier to pass.  Eating foods high in fiber - 12 servings - such as  Vegetables: Root (potatoes, carrots, turnips), Leafy green (lettuce, salad greens, celery, spinach), High residue (cabbage, broccoli,  etc.) Fruit: Fresh, Dried (prunes, apricots, cherries), Stewed (applesauce)  Whole grain breads, pasta, whole wheat Bran cereals, muffins, etc. Consider adding supplemental bulking fiber which retains large volumes of water: Psyllium ground seeds (native plant from [place])--available as Metamucil, Konsyl, Effersyllium, Per Diem Fiber, or the less expensive generic forms.  Citrucel  (methylcellulose wood fiber) . FiberCon (Polycarbophil) Polyethylene Glycol - and artificial fiber commonly called Miralax or Glycolax.  It is helpful for people with gassy or bloated feelings with regular fiber Flax Seed - a less gassy natural fiber  Laxatives can be useful for a short period if constipation is severe Osmotics (Milk of Magnesia, Fleets Phospho-Soda, Magnesium Citrate)  Stimulants (Senokot,   Castor Oil,  Dulcolax, Ex-Lax)    Laxatives are not a good long-term solution as it can stress the bowels and cause too much mineral loss and dehydration.   Avoid taking laxatives for more than 7 days in a row.  AVOID  DIARRHEA Switch to liquids and simpler foods for a few days to avoid stressing your intestines further. Avoid dairy products (especially milk & ice cream) for a short time.  The intestines often can lose the ability to digest lactose when stressed. Avoid foods that cause gassiness or bloating.  Typical foods include beans and other legumes, cabbage, broccoli, and dairy foods.  Every person has some sensitivity to other foods, so listen to your body and avoid those foods that trigger problems for you. Adding fiber (Citrucel, Metamucil, FiberCon, Flax seed, Miralax) gradually can help thicken stools by absorbing excess fluid and retrain the intestines to act more normally.  Slowly increase the dose over a few weeks.  Too much fiber too soon can backfire and cause cramping & bloating. Probiotics (such as active yogurt, Align, etc) may help repopulate the intestines and colon with normal bacteria and calm down a sensitive digestive tract.  Most studies show it to be of mild help, though, and such products can be costly. Medicines: Bismuth subsalicylate (ex. Kayopectate, Pepto Bismol) every 30 minutes for up to 6 doses can help control diarrhea.  Avoid if pregnant. Loperamide (Immodium) can slow down diarrhea.  Start with two tablets (4mg  total) first and then try one tablet every 6 hours.  Avoid if you are having fevers or severe pain.  If you are not better or start feeling worse, stop all medicines and call your doctor for advice Call your doctor if you are getting worse or not better.  Sometimes further testing (cultures, endoscopy, X-ray studies, bloodwork, etc) may be needed to help diagnose and treat the cause of the diarrhea.  TROUBLESHOOTING IRREGULAR BOWELS 1) Avoid extremes of bowel movements (no bad constipation/diarrhea) 2) Miralax 17gm mixed in 8oz. water or juice-daily. May use BID as needed.  3) Gas-x,Phazyme, etc. as needed for gas & bloating.  4) Soft,bland diet. No spicy,greasy,fried  foods.  5) Prilosec over-the-counter as needed  6) May hold gluten/wheat products from diet to see if symptoms improve.  7)  May try probiotics (Align, Activa, etc) to help calm the bowels down 7) If symptoms become worse call back immediately.   TREATMENT OF HEMORRHOID FLARE If these preventive measures fail, you must take action right away! Hemorrhoids are one condition that can be mild in the morning and become intolerable by nightfall. Most hemorrhoidal flares take several weeks to calm down.  These suggestions can help: Warm soaks.  This helps more than any topical medication.  Use up to 8 times a day.  Usually  sitz baths or sitting in a warm bathtub helps.  Sitting on moist warm towels are helpful.  Switching to ice packs/cool compresses can be helpful  Use a Sitz Bath 4-8 times a day for relief A sitz bath is a warm water bath taken in the sitting position that covers only the hips and buttocks. It may be used for either healing or hygiene purposes. Sitz baths are also used to relieve pain, itching, or muscle spasms. The water may contain medicine. Moist heat will help you heal and relax.  HOME CARE INSTRUCTIONS  Take 3 to 4 sitz baths a day. 1. Fill the bathtub half full with warm water. 2. Sit in the water and open the drain a little. 3. Turn on the warm water to keep the tub half full. Keep the water running constantly. 4. Soak in the water for 15 to 20 minutes. 5. After the sitz bath, pat the affected area dry first. SEEK MEDICAL CARE IF:  You get worse instead of better. Stop the sitz baths if you get worse.  Normalize your bowels.  Extremes of diarrhea or constipation will make hemorrhoids worse.  One soft bowel movement a day is the goal.  Fiber can help get your bowels regular Wet wipes instead of toilet paper Pain control with a NSAID such as ibuprofen (Advil) or naproxen (Aleve) or acetaminophen (Tylenol) around the clock.  Narcotics are constipating and should be minimized  if possible Topical creams contain steroids (bydrocortisone) or local anesthetic (xylocaine) can help make pain and itching more tolerable.   EVALUATION If hemorrhoids are still causing problems, you could benefit by an evaluation by a surgeon.  The surgeon will obtain a history and examine you.  If hemorrhoids are diagnosed, some therapies can be offered in the office, usually with an anoscope into the less sensitive area of the rectum: -injection of hemorrhoids (sclerotherapy) can scar the blood vessels of the swollen/enlarged hemorrhoids to help shrink them down to a more normal size -rubber banding of the enlarged hemorrhoids to help shrink them down to a more normal size -drainage of the blood clot causing a thrombosed hemorrhoid,  to relieve the severe pain   While 90% of the time such problems from hemorrhoids can be managed without preceding to surgery, sometimes the hemorrhoids require a operation to control the problem (uncontrolled bleeding, prolapse, pain, etc.).   This involves being placed under general anesthesia where the surgeon can confirm the diagnosis and remove, suture, or staple the hemorrhoid(s).  Your surgeon can help you treat the problem appropriately.

## 2018-03-28 NOTE — Transfer of Care (Signed)
Immediate Anesthesia Transfer of Care Note  Patient: [name]  Procedure(s) Performed: Procedure(s): ANORECTAL EXAM UNDER ANESTHESIA lateral internal partial sphincterotomy, hemorrhoidal ligation pixie, HEMORRHOIDECTOMY (N/A) ANAL SPHINCTEROTOMY (N/A)  Patient Location: PACU  Anesthesia Type:General  Level of Consciousness: Patient easily awoken, sedated, comfortable, cooperative, following commands, responds to stimulation.   Airway & Oxygen Therapy: Patient spontaneously breathing, ventilating well, oxygen via simple oxygen mask.  Post-op Assessment: Report given to PACU RN, vital signs reviewed and stable, moving all extremities.   Post vital signs: Reviewed and stable.  Complications: No apparent anesthesia complications Last Vitals:  Vitals Value Taken Time  BP 158/96 03/28/2018  2:01 PM  Temp    Pulse 63 03/28/2018  2:05 PM  Resp 15 03/28/2018  2:05 PM  SpO2 96 % 03/28/2018  2:05 PM  Vitals shown include unvalidated device data.  Last Pain:  Vitals:   03/28/18 0901  TempSrc: Oral      Patients Stated Pain Goal: 3 (63/[id number])  Complications: No apparent anesthesia complications

## 2018-03-28 NOTE — Anesthesia Procedure Notes (Signed)
Procedure Name: Intubation Date/Time: 03/28/2018 12:20 PM Performed by: Deliah Boston, CRNA Pre-anesthesia Checklist: Patient identified, Emergency Drugs available, Suction available and Patient being monitored Patient Re-evaluated:Patient Re-evaluated prior to induction Oxygen Delivery Method: Circle system utilized Preoxygenation: Pre-oxygenation with 100% oxygen Induction Type: IV induction Ventilation: Mask ventilation without difficulty Laryngoscope Size: 4 and Mac Grade View: Grade I Tube type: Oral Tube size: 7.0 mm Number of attempts: 1 Airway Equipment and Method: Oral airway Placement Confirmation: ETT inserted through vocal cords under direct vision,  positive ETCO2 and breath sounds checked- equal and bilateral Secured at: 21 cm Tube secured with: Tape Dental Injury: Teeth and Oropharynx as per pre-operative assessment

## 2018-03-28 NOTE — Interval H&P Note (Signed)
History and Physical Interval Note:  03/28/2018 12:04 PM  ADALIS GATTI  has presented today for surgery, with the diagnosis of anal fissure refractory to medical management  The various methods of treatment have been discussed with the patient and family. After consideration of risks, benefits and other options for treatment, the patient has consented to  Procedure(s): ANORECTAL EXAM UNDER ANESTHESIA WITH POSSIBLE HEMORRHOIDECTOMY (N/A) ANAL SPHINCTEROTOMY (N/A) as a surgical intervention .  The patient's history has been reviewed, patient examined, no change in status, stable for surgery.  I have reviewed the patient's chart and labs.  Questions were answered to the patient's satisfaction.    I have re-reviewed the the patient's records, history, medications, and allergies.  I have re-examined the patient.  I again discussed intraoperative plans and goals of post-operative recovery.  The patient agrees to proceed.  LEGNA MAUSOLF  06/26/1951 [id number]  Patient Care Team: McDiarmid, Blane Ohara, MD as PCP - General Care, [facility] as Consulting Physician (Pain Medicine) Susa Day, MD as Consulting Physician (Orthopedic Surgery) Kennith Center, RD as Dietitian (Family Medicine)  Patient Active Problem List   Diagnosis Date Noted  . Coronary artery calcification seen on CAT scan 12/03/2017  . Medication management 12/14/2016  . Spinal stenosis of lumbar region 06/21/2016  . Atherosclerosis of abdominal aorta ([place]) 05/12/2016  . Encounter for chronic pain management 12/03/2014  . Metabolic syndrome 73/53/2992  . Periodic limb movements of sleep 12/05/2010  . VITAMIN D DEFICIENCY 03/03/2010  . Pre-diabetes 12/25/2008  . HYPERCHOLESTEROLEMIA 12/24/2008  . Chronic pain syndrome 12/24/2008  . Functional gastrointestinal disorder 06/18/2008  . SIGMOID POLYP 01/23/2008  . HYPERTRIGLYCERIDEMIA 11/11/2007  . Obesity, Class III, BMI 40-49.9 (morbid obesity) ([place])  09/30/2007  . RESTLESS LEGS SYNDROME 09/11/2007  . Fibromyalgia syndrome 07/15/2007  . Hip osteoarthritis 05/23/2007  . Hypothyroidism 02/15/2007  . Bipolar 2 disorder ([place]) 01/03/2007  . HYPERTENSION, BENIGN SYSTEMIC 01/03/2007  . Allergic rhinitis 01/03/2007  . Stage 1 mild COPD by GOLD classification ([place]) 01/03/2007  . Esophageal reflux 01/03/2007  . Irritable bowel syndrome 01/03/2007  . Osteoarthritis of spine 01/03/2007    Past Medical History:  Diagnosis Date  . Adrenal nodule ([place]) 04/08/2011   04/08/11 Abdominal CT showed Left adrenal nodule which is tachnically indeterminate but most likely an adenoma.  It is 1.7 cm and 42 HU on portal venous phase image. 31 HU on delayed images.   01/09/2012 Abdominal CT w/ & w/o CM: Stable benign left adrenal adenoma. No further specific follow-up is needed for this finding.    . Allergic rhinitis 01/03/2007   Qualifier: Diagnosis of  By: Drucie Ip    . Anemia    hx of  in 20's  . Anxiety   . Bipolar disorder ([place])   . Carpal tunnel syndrome 02/14/2007   S/P carpel tunnel release bilaterally.    . Cataracts, bilateral   . Chronic pain syndrome 12/24/2008  . COPD WITHOUT EXACERBATION 01/03/2007   Qualifier: Diagnosis of  By: Drucie Ip  emphysema  . DISC WITH RADICULOPATHY 01/03/2007   Qualifier: Diagnosis of  By: Drucie Ip    . Esophageal reflux 01/03/2007   Centricity Description: GASTROESOPHAGEAL REFLUX, NO ESOPHAGITIS Qualifier: Diagnosis of  By: Drucie Ip   Centricity Description: GERD Qualifier: Diagnosis of  By: Tye Savoy MD, Tommi Rumps    . Functional gastrointestinal disorder 06/18/2008   Functional Gastrointestinal Disorder with frequent eructation and bloating sensation with acute flares.  Responsive to Compazine and  Bentyl.  Takes a few days to calm down.    Marland Kitchen HEMORRHOIDS, NOS 01/03/2007   Qualifier: Diagnosis of  By: Drucie Ip    . Hepatic steatosis 04/12/2011  . Herpes simplex labialis 05/11/2011  . Hip  osteoarthritis 05/23/2007   MRI Pelvis (03/13/07) Bilateral osteoarthritis of the hips, left greater than right.  The  patient has a slightly more prominent hip effusion on the left than  the right.  No other significant abnormality.    . History of MRSA infection 04/28/2011  . HYPERCHOLESTEROLEMIA 12/24/2008  . Hypertension   . HYPERTENSION, BENIGN SYSTEMIC 01/03/2007   Qualifier: Diagnosis of  By: Drucie Ip    . HYPERTRIGLYCERIDEMIA 11/11/2007   Qualifier: Diagnosis of  By: McDiarmid MD, Sherren Mocha    . Hypokalemia 12/12/2017  . Hypothyroidism 02/15/2007   Qualifier: Diagnosis of  By: McDiarmid MD, Sherren Mocha    . HYPOTHYROIDISM, BORDERLINE 02/15/2007  . Irritable bowel syndrome 01/03/2007   Qualifier: Diagnosis of  By: Drucie Ip    . LACTOSE INTOLERANCE 03/10/2008  . Major depressive disorder, recurrent episode ([place]) 01/03/2007   Qualifier: Diagnosis of  By: Drucie Ip    . Obesity (BMI 30.0-34.9) 09/30/2007   Has lost 55 lbs since easter. Goal of <190 by 08/05/12.     . Obesity, Class III, BMI 40-49.9 (morbid obesity) ([place]) 09/30/2007       . Osteoarthritis of spine 01/03/2007   MR I Lumbar (08/03): GeneralDDD; L2-3 Large  HNP.  Mod pos  hnp - 12/14/2005, smhnpw/irritL4root - 12/14/2005    . OSTEOARTHRITIS, HANDS, BILATERAL 06/18/2008   Qualifier: Diagnosis of  By: McDiarmid MD, Sherren Mocha    . PATELLO FEMORAL STRESS SYNDROME 01/03/2007   Qualifier: Diagnosis of  By: Drucie Ip    . Periodic limb movements of sleep 12/05/2010   Diagnosed on Polysomnography testing Fall 2011.     Marland Kitchen Pneumonia    walking pneumonia in the 90's  . Pre-diabetes 12/25/2008   Qualifier: Diagnosis of  By: McDiarmid MD, Sherren Mocha    . RESTLESS LEGS SYNDROME 09/11/2007   Polysomnography (10/2010, Dr Keturah Barre, interpreter. ) showed periodic limb movements that frequently awoke patient from sleep with arousal index of 4 per hour. Dr Danton Sewer (Sleep Specialist) thought her RLS was the major contributor to patient poor sleep  condition, more than any sleep apnea.  He recommended considering opamine agonist (either requip or mirapex) to see if this will help.     . Rosacea, acne 10/24/2011  . SIGMOID POLYP 01/23/2008  . Solitary lung nodule 04/08/2011  . Stress incontinence   . Synovial cyst of lumbar facet joint 02/17/2016  . Urticaria, chronic 05/22/2011  . VITAMIN D DEFICIENCY 03/03/2010    Past Surgical History:  Procedure Laterality Date  . bladder tack  1998   Dr Jeffie Pollock (urology)  . BUNIONECTOMY  1992   bilateral feet with pins in great toes  . CARDIAC CATHETERIZATION    . CARPAL TUNNEL RELEASE  2010   Bilateral, Dr Theodis Sato  . COLONOSCOPY  2004   Dr Verdia Kuba (GI)  . CYSTOCELE REPAIR  1998    Dr Gertie Fey (GYN)-  . ESOPHAGOGASTRODUODENOSCOPY    . EXAM ANORECTAL W/ ULTRASOUND     Dr. Johney Maine 03-28-18   . EXPLORATORY LAPAROTOMY    . FACIAL LACERATIONS REPAIR     Facial Laceration repair as adolescent   . INJECTION HIP INTRA ARTICULAR  2008   Left Hip fluoroscopically-guided corticosteorid injection for painful osteoarthritis with good effect (  06/2007)  . LAPAROSCOPY FOR ECTOPIC PREGNANCY    . LUMBAR LAMINECTOMY/DECOMPRESSION MICRODISCECTOMY N/A 06/21/2016   Procedure: MICRO LUMBAR DECOMPRESSION L4-L5 AND REMOVAL OF SYNOVIAL CYST    1 LEVEL;  Surgeon: Susa Day, MD;  Location: [facility];  Service: Orthopedics;  Laterality: N/A;  . NM MYOVIEW LTD  2011   Dr Daneen Schick, III (Card)  . RECTOCELE REPAIR  1998   Dr Gertie Fey (GYN)    Social History   Socioeconomic History  . Marital status: Divorced    Spouse name: Not on file  . Number of children: Not on file  . Years of education: Not on file  . Highest education level: Not on file  Occupational History  . Occupation: Surveyor, quantity: [employer]  . Financial resource strain: Not on file  . Food insecurity:    Worry: Not on file    Inability: Not on file  . Transportation needs:    Medical: Not on file    Non-medical: Not on file   Tobacco Use  . Smoking status: Former Smoker    Packs/day: 1.50    Years: 44.00    Pack years: 66.00    Types: Cigarettes    Last attempt to quit: 04/06/2008    Years since quitting: 9.9  . Smokeless tobacco: Never Used  Substance and Sexual Activity  . Alcohol use: No    Frequency: Never  . Drug use: No    Comment: hx of marijuana use - last used 2 months ago   . Sexual activity: Not Currently  Lifestyle  . Physical activity:    Days per week: Not on file    Minutes per session: Not on file  . Stress: Not on file  Relationships  . Social connections:    Talks on phone: Not on file    Gets together: Not on file    Attends religious service: Not on file    Active member of club or organization: Not on file    Attends meetings of clubs or organizations: Not on file    Relationship status: Not on file  . Intimate partner violence:    Fear of current or ex partner: Not on file    Emotionally abused: Not on file    Physically abused: Not on file    Forced sexual activity: Not on file  Other Topics Concern  . Not on file  Social History Narrative   Retired [employer] worker - early retirement for mental health reasons.    Has Worked part-time  at [employer].Marland KitchenMarland KitchenWorking as Scientist, water quality at [employer] on [address]   pt is separated.    Living with her dgt and son-in-law in two story home.   1 flight of stairs     Quit smoking tobacco June 09 started at age 29. 1 1/2 to 2 ppd .   No etoh    Hx of smokes marijuana - periodically attempts quitting.   Religious - Pentecostal   4 children    No IVDA    Family History  Problem Relation Age of Onset  . Diabetes Father   . Heart disease Father   . Diabetes type II Unknown   . Hypertension Unknown   . Heart attack Unknown   . Alcohol abuse Unknown   . Depression Unknown   . Asthma Unknown   . Migraines Unknown   . Hypertension Mother   . Hypertension Sister   . Obesity Sister   . Kidney  disease Brother   . Hypertension Daughter    . Hypertension Son   . Heart disease Brother   . Hypertension Brother   . Bipolar disorder Daughter   . Hypertension Daughter   . Hypertension Son     Medications Prior to Admission  Medication Sig Dispense Refill Last Dose  . acetaminophen (TYLENOL) 500 MG tablet Take 1,000 mg by mouth daily as needed for headache.     Marland Kitchen aspirin EC 81 MG tablet Take 1 tablet (81 mg total) by mouth at bedtime. May resume 4 days post-op (Patient taking differently: Take 81 mg by mouth at bedtime. May resume 4 days post-op)   Taking  . Carboxymethylcellulose Sodium (THERATEARS OP) Place 2 drops into both eyes 4 (four) times daily as needed (dry eyes).     . citalopram (CELEXA) 40 MG tablet Take 0.5 tablets (20 mg total) by mouth at bedtime. (Patient taking differently: Take 40 mg by mouth daily. )  0 03/28/2018 at 0500  . dicyclomine (BENTYL) 20 MG tablet TAKE 1/2 TO 1 TABLET 4 TIMES A DAY BEFORE MEALS AND AT BEDTIME (Patient taking differently: Take 10 mg by mouth 2 (two) times daily. ) 180 tablet 6   . fexofenadine (ALLEGRA) 180 MG tablet Take 180 mg by mouth daily.   Taking  . fluticasone (FLONASE) 50 MCG/ACT nasal spray Place 2 sprays into both nostrils daily.     . hydrochlorothiazide (HYDRODIURIL) 25 MG tablet Take 1 tablet (25 mg total) by mouth daily. 90 tablet 4 03/27/2018 at 2000  . [START ON 04/01/2018] HYDROcodone-acetaminophen (NORCO) 5-325 MG tablet Take 2 tablets by mouth at bedtime. 60 tablet 0 03/27/2018 at 2000  . hydrOXYzine (VISTARIL) 50 MG capsule Take 150 mg by mouth at bedtime.    03/27/2018 at 2000  . levothyroxine (SYNTHROID, LEVOTHROID) 50 MCG tablet TAKE 3 TABLETS BY MOUTH DAILY BEFORE BREAKFAST. (Patient taking differently: Take 125 mcg by mouth daily. ) 270 tablet 3 03/28/2018 at 0700  . LORazepam (ATIVAN) 1 MG tablet Take 1 mg by mouth 3 (three) times daily.    03/28/2018 at 0730  . metoprolol tartrate (LOPRESSOR) 50 MG tablet Take 0.5 tablets (25 mg total) by mouth 2 (two) times daily.  90 tablet 3 03/28/2018 at 0730  . morphine (MS CONTIN) 15 MG 12 hr tablet Take 1 tablet (15 mg total) by mouth every 12 (twelve) hours. 60 tablet 0 03/28/2018 at 0730  . Multiple Minerals-Vitamins (CALCIUM-MAGNESIUM-ZINC-D3) TABS Take 3 tablets by mouth daily.     . Multiple Vitamin (MULTIVITAMIN WITH MINERALS) TABS tablet Take 1 tablet by mouth daily.   Not Taking  . nitroGLYCERIN (NITROSTAT) 0.4 MG SL tablet Place 1 tablet (0.4 mg total) under the tongue every 5 (five) minutes as needed for chest pain. 25 tablet prn Not Taking  . omeprazole (PRILOSEC) 40 MG capsule Take 1 capsule (40 mg total) by mouth daily. 90 capsule 3 03/28/2018 at 0730  . polyethylene glycol (MIRALAX) powder Take 17 g by mouth daily.    Taking  . pramoxine (PROCTOFOAM) 1 % foam PLACE 1 APPLICATION RECTALLY EVERY 4 (FOUR) HOURS AS NEEDED FOR ANAL IRRITATION OR HEMORRHOIDS.  1   . prazosin (MINIPRESS) 1 MG capsule Take 1 capsule (1 mg total) by mouth at bedtime.   Taking  . prochlorperazine (COMPAZINE) 10 MG tablet TAKE ONE TABLET BY MOUTH TWICE A DAY AS NEEDED FOR NAUSEA OR VOMITING 30 tablet 3 Not Taking  . ramipril (ALTACE) 10 MG capsule Take 1 capsule (  10 mg total) by mouth daily. 90 capsule 3 03/27/2018 at 0700  . Simethicone (GAS-X PO) Take 2 tablets by mouth daily as needed (gas).     Marland Kitchen tiZANidine (ZANAFLEX) 2 MG tablet Take 1 tablet (2 mg total) by mouth 2 (two) times daily as needed for muscle spasms. 360 tablet 3 03/28/2018 at 0730  . topiramate (TOPAMAX) 50 MG tablet Take 1 tablet (50 mg total) by mouth 2 (two) times daily. 60 tablet 2 03/28/2018 at 0730  . traZODone (DESYREL) 100 MG tablet Take 300 mg by mouth at bedtime.    03/27/2018 at 2000  . vitamin B-12 (CYANOCOBALAMIN) 1000 MCG tablet Take 1,000 mcg by mouth daily.   Taking  . vitamin C (ASCORBIC ACID) 500 MG tablet Take 500 mg by mouth daily.     Derrill Memo ON 05/01/2018] HYDROcodone-acetaminophen (NORCO) 5-325 MG tablet Take 2 tablets by mouth at bedtime. (Patient  not taking: Reported on 03/18/2018) 60 tablet 0 Not Taking at Unknown time  . HYDROcodone-acetaminophen (NORCO/VICODIN) 5-325 MG tablet Take 2 tablets by mouth at bedtime. (Patient not taking: Reported on 03/18/2018) 60 tablet 0 Not Taking at Unknown time  . morphine (MS CONTIN) 15 MG 12 hr tablet Take 1 tablet (15 mg total) by mouth every 12 (twelve) hours. (Patient not taking: Reported on 03/18/2018) 60 tablet 0 Not Taking at Unknown time  . [START ON 04/14/2018] morphine (MS CONTIN) 15 MG 12 hr tablet Take 1 tablet (15 mg total) by mouth every 12 (twelve) hours. (Patient not taking: Reported on 03/18/2018) 60 tablet 0 Not Taking at Unknown time  . phentermine 37.5 MG capsule Take 1 capsule (37.5 mg total) by mouth every morning. 30 capsule 0   . phentermine 37.5 MG capsule Take 1 capsule (37.5 mg total) by mouth every morning. (Patient not taking: Reported on 03/18/2018) 30 capsule 0 Not Taking at Unknown time  . phentermine 37.5 MG capsule Take 1 capsule (37.5 mg total) by mouth every morning. (Patient not taking: Reported on 03/18/2018) 30 capsule 0 Not Taking at Unknown time  . potassium chloride (KLOR-CON M10) 10 MEQ tablet Take 4 tablets (40 mEq total) by mouth at bedtime. 360 tablet 3   . topiramate (TOPAMAX) 50 MG tablet TAKE 1 TABLET BY MOUTH TWICE A DAY (Patient not taking: Reported on 03/18/2018) 60 tablet 2 Not Taking at Unknown time    Current Facility-Administered Medications  Medication Dose Route Frequency Provider Last Rate Last Dose  . bupivacaine liposome (EXPAREL) 1.3 % injection 266 mg  20 mL Infiltration On Call to OR Michael Boston, MD      . ceFAZolin (ANCEF) IVPB 2g/100 mL premix  2 g Intravenous On Call to OR Michael Boston, MD      . Chlorhexidine Gluconate Cloth 2 % PADS 6 each  6 each Topical Once Michael Boston, MD       And  . Chlorhexidine Gluconate Cloth 2 % PADS 6 each  6 each Topical Once Michael Boston, MD      . lactated ringers infusion   Intravenous Continuous Nolon Nations, MD         Allergies  Allergen Reactions  . Diclofenac-Misoprostol Shortness Of Breath    elevated blood pressure  . Aripiprazole     Doesn't want to take because of possible side effects  . Emsam [Selegiline] Other (See Comments)    Burned the skin after using for a year  . Estradiol Swelling  . Ibuprofen Hives  . Naproxen Nausea  Only  . Neurontin [Gabapentin] Hives and Swelling  . Paroxetine Other (See Comments)    Panic attacks  . Prednisone Other (See Comments)    Severe depression  . Tape Other (See Comments)    Paper tape caused blisters, plastic tape ok    BP (!) 148/92   Pulse 61   Temp 97.8 F (36.6 C) (Oral)   Resp 18   Ht 5' 3.5" (1.613 m)   Wt 107.5 kg (237 lb)   SpO2 97%   BMI 41.32 kg/m   Labs: No results found for this or any previous visit (from the past 48 hour(s)).  Imaging / Studies: No results found.   Adin Hector, M.D., F.A.C.S. Gastrointestinal and Minimally Invasive Surgery [facility] [address] [place], [place] [address] [phone] Main / Paging  03/28/2018 12:05 PM    Adin Hector

## 2018-03-29 ENCOUNTER — Encounter (HOSPITAL_COMMUNITY): Payer: Self-pay | Admitting: Surgery

## 2018-04-03 NOTE — Telephone Encounter (Signed)
Per pt. Dr.Gross wants her to have colon in August (per colon appointment cx reason).

## 2018-04-17 DIAGNOSIS — M7730 Calcaneal spur, unspecified foot: Secondary | ICD-10-CM | POA: Insufficient documentation

## 2018-04-17 HISTORY — DX: Calcaneal spur, unspecified foot: M77.30

## 2018-04-22 ENCOUNTER — Encounter: Payer: Medicare Other | Admitting: Gastroenterology

## 2018-04-22 DIAGNOSIS — T148XXD Other injury of unspecified body region, subsequent encounter: Secondary | ICD-10-CM | POA: Diagnosis not present

## 2018-05-16 ENCOUNTER — Encounter: Payer: Self-pay | Admitting: Family Medicine

## 2018-05-16 ENCOUNTER — Other Ambulatory Visit: Payer: Self-pay

## 2018-05-16 ENCOUNTER — Ambulatory Visit (INDEPENDENT_AMBULATORY_CARE_PROVIDER_SITE_OTHER): Payer: Medicare Other | Admitting: Family Medicine

## 2018-05-16 VITALS — BP 120/64 | HR 77 | Temp 98.2°F | Ht 64.0 in | Wt 248.0 lb

## 2018-05-16 DIAGNOSIS — G894 Chronic pain syndrome: Secondary | ICD-10-CM

## 2018-05-16 DIAGNOSIS — K929 Disease of digestive system, unspecified: Secondary | ICD-10-CM

## 2018-05-16 DIAGNOSIS — Z5689 Other problems related to employment: Secondary | ICD-10-CM

## 2018-05-16 DIAGNOSIS — E66813 Obesity, class 3: Secondary | ICD-10-CM

## 2018-05-16 DIAGNOSIS — G8929 Other chronic pain: Secondary | ICD-10-CM

## 2018-05-16 DIAGNOSIS — M48061 Spinal stenosis, lumbar region without neurogenic claudication: Secondary | ICD-10-CM | POA: Diagnosis not present

## 2018-05-16 DIAGNOSIS — M47896 Other spondylosis, lumbar region: Secondary | ICD-10-CM | POA: Diagnosis not present

## 2018-05-16 DIAGNOSIS — M7732 Calcaneal spur, left foot: Secondary | ICD-10-CM

## 2018-05-16 MED ORDER — TOPIRAMATE 50 MG PO TABS: ORAL_TABLET | ORAL | 0 refills | Status: DC

## 2018-05-16 MED ORDER — MORPHINE SULFATE ER 15 MG PO TBCR
15.0000 mg | EXTENDED_RELEASE_TABLET | Freq: Two times a day (BID) | ORAL | 0 refills | Status: DC
Start: 2018-05-16 — End: 2018-08-14

## 2018-05-16 MED ORDER — HYDROCODONE-ACETAMINOPHEN 10-325 MG PO TABS: 1.0000 | ORAL_TABLET | Freq: Four times a day (QID) | ORAL | 0 refills | Status: DC | PRN

## 2018-05-16 MED ORDER — MORPHINE SULFATE ER 15 MG PO TBCR: 15.0000 mg | EXTENDED_RELEASE_TABLET | Freq: Two times a day (BID) | ORAL | 0 refills | Status: DC

## 2018-05-16 MED ORDER — DICYCLOMINE HCL 20 MG PO TABS: ORAL_TABLET | ORAL | 6 refills | Status: DC

## 2018-05-16 NOTE — Patient Instructions (Signed)
Blood pressure looks good. Continue with getting your colonoscopy in August Note for water at work station.  Water Aerobics is a great idea.

## 2018-05-17 ENCOUNTER — Encounter: Payer: Self-pay | Admitting: Family Medicine

## 2018-05-17 DIAGNOSIS — Z5689 Other problems related to employment: Secondary | ICD-10-CM

## 2018-05-17 HISTORY — DX: Other problems related to employment: Z56.89

## 2018-05-17 NOTE — Assessment & Plan Note (Signed)
New problem [place] letter given stating patient's multiple medications are causing dry mouth which interferes with custermer service.  Requested that [name] be allowed to keep a water bottle at her work station.

## 2018-05-17 NOTE — Assessment & Plan Note (Signed)
New problem Managed by podiatry Requested records from podiatry Patient stable and improving.  Returning to work this week.

## 2018-05-17 NOTE — Progress Notes (Signed)
Subjective:    Patient ID: [name], female    DOB: 29-Jun-1951, 67 y.o.   MRN: [id number] [name] is alone Sources of clinical information for visit is/are patient and past medical records. Nursing assessment for this office visit was reviewed with the patient for accuracy and revision.  Previous Report(s) Reviewed: historical medical records  Depression screen [facility] 2/9 05/16/2018  Decreased Interest 1  Down, Depressed, Hopeless 1  PHQ - 2 Score 2  Altered sleeping -  Tired, decreased energy -  Change in appetite -  Feeling bad or failure about yourself  -  Trouble concentrating -  Moving slowly or fidgety/restless -  Suicidal thoughts -  PHQ-9 Score -  Difficult doing work/chores -  Some recent data might be hidden   Fall Risk  05/16/2018 10/04/2017 05/31/2017 03/22/2017 02/01/2017  Falls in the past year? No No No No No  Number falls in past yr: - - - - -  Risk Factor Category  - - - - -  Risk for fall due to : - Impaired balance/gait - - -   Chronic Pain Syndrome - locations of pain left shoulderanteriorly, bilateral upper thorax posteriorly, left buttock, and right ankle - HPI Uses Vicodin as well as To[pical balms, and chiropracteery  which does help  ADE with analgesic therapies: constipation Non-allopathic therapites: chiropractery   Pain Inventory Average Pain: 6 Pain Right Now: 4  In the last 24 hours, has pain interfered with the following? (0 to 10 scale, 10 worse) General activity: 6 Relation with others: 4 Enjoyment of life: 6 What TIME of day is your pain at its worst? 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  Anxiety: yes  Prior Studies Any new tests / imaging studies since last visit?: no    Other healthcare providers involved in your care: Dr. Leonette Nutting at [facility] for fracture bone spur last month.   Any diagnostic or therapeutic changes since last visit?: immobilization left foot for fractured bone spur.  Morbid Obesity - Weight up 23 lb since weight 215 in January 19.  Reports stress from recent death of close friend to cancer along with the immobility from her bone spur fracture has lead to her weight gain. - It has been over month since she used Phentermine.  She continues on the Topamax.  Work-related problem - Pt complaining that the [employer] policy prevents her from keeping water at her station.  Her mouth gets very dry from her multiple medications to the point that the dryness impairs her ability to speak clearly to customers.  Recent foot surgery - Last month she twisted her left ankle with slowly worsening pain. - Dr Leonette Nutting (Pod) placed in in bracing boot for until last week.  - She is able to walk on left ankle but hurting   SH: Smoking status reviewed  ROS:  See HPI No belching/nausea/vominting with reduction in her  use of Bentyl No episodes of confusion  Review of Systems     Objective:   Physical Exam VS reviewed GEN: Alert, Cooperative, Groomed, NAD HEENT: PERRL; EAC bilaterally not occluded, TM's translucent with normal LM, (+) LR;                No cervical LAN, No thyromegaly, No palpable masses COR: RRR, No M/G/R, No JVD, Normal PMI size and location LUNGS: BCTA, No Acc mm use, speaking in full sentences EXT: No peripheral leg edema.  SKIN: No lesion nor rashes of  face/trunk/extremities Gait: Normal speed, No significant path deviation, Step through +,  Psych: Normal affect/thought/speech/language        Assessment & Plan:

## 2018-05-27 DIAGNOSIS — F40248 Other situational type phobia: Secondary | ICD-10-CM | POA: Diagnosis not present

## 2018-05-27 DIAGNOSIS — F319 Bipolar disorder, unspecified: Secondary | ICD-10-CM | POA: Diagnosis not present

## 2018-05-27 DIAGNOSIS — F332 Major depressive disorder, recurrent severe without psychotic features: Secondary | ICD-10-CM | POA: Diagnosis not present

## 2018-05-27 DIAGNOSIS — F4000 Agoraphobia, unspecified: Secondary | ICD-10-CM | POA: Diagnosis not present

## 2018-06-08 ENCOUNTER — Other Ambulatory Visit: Payer: Self-pay | Admitting: Family Medicine

## 2018-06-17 DIAGNOSIS — F319 Bipolar disorder, unspecified: Secondary | ICD-10-CM | POA: Diagnosis not present

## 2018-06-17 DIAGNOSIS — F332 Major depressive disorder, recurrent severe without psychotic features: Secondary | ICD-10-CM | POA: Diagnosis not present

## 2018-06-17 DIAGNOSIS — F4000 Agoraphobia, unspecified: Secondary | ICD-10-CM | POA: Diagnosis not present

## 2018-06-17 DIAGNOSIS — F40248 Other situational type phobia: Secondary | ICD-10-CM | POA: Diagnosis not present

## 2018-06-26 DIAGNOSIS — F332 Major depressive disorder, recurrent severe without psychotic features: Secondary | ICD-10-CM | POA: Diagnosis not present

## 2018-06-27 DIAGNOSIS — F332 Major depressive disorder, recurrent severe without psychotic features: Secondary | ICD-10-CM | POA: Diagnosis not present

## 2018-06-28 DIAGNOSIS — F332 Major depressive disorder, recurrent severe without psychotic features: Secondary | ICD-10-CM | POA: Diagnosis not present

## 2018-07-01 DIAGNOSIS — F319 Bipolar disorder, unspecified: Secondary | ICD-10-CM | POA: Diagnosis not present

## 2018-07-01 DIAGNOSIS — F4000 Agoraphobia, unspecified: Secondary | ICD-10-CM | POA: Diagnosis not present

## 2018-07-01 DIAGNOSIS — F40248 Other situational type phobia: Secondary | ICD-10-CM | POA: Diagnosis not present

## 2018-07-01 DIAGNOSIS — F332 Major depressive disorder, recurrent severe without psychotic features: Secondary | ICD-10-CM | POA: Diagnosis not present

## 2018-07-02 DIAGNOSIS — F332 Major depressive disorder, recurrent severe without psychotic features: Secondary | ICD-10-CM | POA: Diagnosis not present

## 2018-07-03 DIAGNOSIS — F332 Major depressive disorder, recurrent severe without psychotic features: Secondary | ICD-10-CM | POA: Diagnosis not present

## 2018-07-04 DIAGNOSIS — M9901 Segmental and somatic dysfunction of cervical region: Secondary | ICD-10-CM | POA: Diagnosis not present

## 2018-07-04 DIAGNOSIS — M9903 Segmental and somatic dysfunction of lumbar region: Secondary | ICD-10-CM | POA: Diagnosis not present

## 2018-07-04 DIAGNOSIS — M47812 Spondylosis without myelopathy or radiculopathy, cervical region: Secondary | ICD-10-CM | POA: Diagnosis not present

## 2018-07-04 DIAGNOSIS — M5136 Other intervertebral disc degeneration, lumbar region: Secondary | ICD-10-CM | POA: Diagnosis not present

## 2018-07-04 DIAGNOSIS — M5030 Other cervical disc degeneration, unspecified cervical region: Secondary | ICD-10-CM | POA: Diagnosis not present

## 2018-07-04 DIAGNOSIS — M9902 Segmental and somatic dysfunction of thoracic region: Secondary | ICD-10-CM | POA: Diagnosis not present

## 2018-07-04 DIAGNOSIS — F332 Major depressive disorder, recurrent severe without psychotic features: Secondary | ICD-10-CM | POA: Diagnosis not present

## 2018-07-04 DIAGNOSIS — M25519 Pain in unspecified shoulder: Secondary | ICD-10-CM | POA: Diagnosis not present

## 2018-07-05 DIAGNOSIS — F332 Major depressive disorder, recurrent severe without psychotic features: Secondary | ICD-10-CM | POA: Diagnosis not present

## 2018-07-09 DIAGNOSIS — M9902 Segmental and somatic dysfunction of thoracic region: Secondary | ICD-10-CM | POA: Diagnosis not present

## 2018-07-09 DIAGNOSIS — F332 Major depressive disorder, recurrent severe without psychotic features: Secondary | ICD-10-CM | POA: Diagnosis not present

## 2018-07-09 DIAGNOSIS — M47812 Spondylosis without myelopathy or radiculopathy, cervical region: Secondary | ICD-10-CM | POA: Diagnosis not present

## 2018-07-09 DIAGNOSIS — M9901 Segmental and somatic dysfunction of cervical region: Secondary | ICD-10-CM | POA: Diagnosis not present

## 2018-07-09 DIAGNOSIS — M25519 Pain in unspecified shoulder: Secondary | ICD-10-CM | POA: Diagnosis not present

## 2018-07-09 DIAGNOSIS — M9903 Segmental and somatic dysfunction of lumbar region: Secondary | ICD-10-CM | POA: Diagnosis not present

## 2018-07-09 DIAGNOSIS — M5030 Other cervical disc degeneration, unspecified cervical region: Secondary | ICD-10-CM | POA: Diagnosis not present

## 2018-07-09 DIAGNOSIS — M5136 Other intervertebral disc degeneration, lumbar region: Secondary | ICD-10-CM | POA: Diagnosis not present

## 2018-07-10 DIAGNOSIS — F332 Major depressive disorder, recurrent severe without psychotic features: Secondary | ICD-10-CM | POA: Diagnosis not present

## 2018-07-11 DIAGNOSIS — F332 Major depressive disorder, recurrent severe without psychotic features: Secondary | ICD-10-CM | POA: Diagnosis not present

## 2018-07-12 DIAGNOSIS — F332 Major depressive disorder, recurrent severe without psychotic features: Secondary | ICD-10-CM | POA: Diagnosis not present

## 2018-07-15 DIAGNOSIS — F332 Major depressive disorder, recurrent severe without psychotic features: Secondary | ICD-10-CM | POA: Diagnosis not present

## 2018-07-15 DIAGNOSIS — M5136 Other intervertebral disc degeneration, lumbar region: Secondary | ICD-10-CM | POA: Diagnosis not present

## 2018-07-15 DIAGNOSIS — M9902 Segmental and somatic dysfunction of thoracic region: Secondary | ICD-10-CM | POA: Diagnosis not present

## 2018-07-15 DIAGNOSIS — M47812 Spondylosis without myelopathy or radiculopathy, cervical region: Secondary | ICD-10-CM | POA: Diagnosis not present

## 2018-07-15 DIAGNOSIS — M25519 Pain in unspecified shoulder: Secondary | ICD-10-CM | POA: Diagnosis not present

## 2018-07-15 DIAGNOSIS — M9901 Segmental and somatic dysfunction of cervical region: Secondary | ICD-10-CM | POA: Diagnosis not present

## 2018-07-15 DIAGNOSIS — M9903 Segmental and somatic dysfunction of lumbar region: Secondary | ICD-10-CM | POA: Diagnosis not present

## 2018-07-15 DIAGNOSIS — M5030 Other cervical disc degeneration, unspecified cervical region: Secondary | ICD-10-CM | POA: Diagnosis not present

## 2018-07-16 DIAGNOSIS — F332 Major depressive disorder, recurrent severe without psychotic features: Secondary | ICD-10-CM | POA: Diagnosis not present

## 2018-07-16 DIAGNOSIS — M5030 Other cervical disc degeneration, unspecified cervical region: Secondary | ICD-10-CM | POA: Diagnosis not present

## 2018-07-16 DIAGNOSIS — M47812 Spondylosis without myelopathy or radiculopathy, cervical region: Secondary | ICD-10-CM | POA: Diagnosis not present

## 2018-07-16 DIAGNOSIS — M9902 Segmental and somatic dysfunction of thoracic region: Secondary | ICD-10-CM | POA: Diagnosis not present

## 2018-07-16 DIAGNOSIS — M9903 Segmental and somatic dysfunction of lumbar region: Secondary | ICD-10-CM | POA: Diagnosis not present

## 2018-07-16 DIAGNOSIS — M9901 Segmental and somatic dysfunction of cervical region: Secondary | ICD-10-CM | POA: Diagnosis not present

## 2018-07-16 DIAGNOSIS — M5136 Other intervertebral disc degeneration, lumbar region: Secondary | ICD-10-CM | POA: Diagnosis not present

## 2018-07-16 DIAGNOSIS — M25519 Pain in unspecified shoulder: Secondary | ICD-10-CM | POA: Diagnosis not present

## 2018-07-17 DIAGNOSIS — F332 Major depressive disorder, recurrent severe without psychotic features: Secondary | ICD-10-CM | POA: Diagnosis not present

## 2018-07-18 DIAGNOSIS — F332 Major depressive disorder, recurrent severe without psychotic features: Secondary | ICD-10-CM | POA: Diagnosis not present

## 2018-07-19 DIAGNOSIS — F332 Major depressive disorder, recurrent severe without psychotic features: Secondary | ICD-10-CM | POA: Diagnosis not present

## 2018-07-22 DIAGNOSIS — F332 Major depressive disorder, recurrent severe without psychotic features: Secondary | ICD-10-CM | POA: Diagnosis not present

## 2018-07-23 DIAGNOSIS — F332 Major depressive disorder, recurrent severe without psychotic features: Secondary | ICD-10-CM | POA: Diagnosis not present

## 2018-07-24 DIAGNOSIS — F332 Major depressive disorder, recurrent severe without psychotic features: Secondary | ICD-10-CM | POA: Diagnosis not present

## 2018-07-25 DIAGNOSIS — M47812 Spondylosis without myelopathy or radiculopathy, cervical region: Secondary | ICD-10-CM | POA: Diagnosis not present

## 2018-07-25 DIAGNOSIS — M9903 Segmental and somatic dysfunction of lumbar region: Secondary | ICD-10-CM | POA: Diagnosis not present

## 2018-07-25 DIAGNOSIS — M5030 Other cervical disc degeneration, unspecified cervical region: Secondary | ICD-10-CM | POA: Diagnosis not present

## 2018-07-25 DIAGNOSIS — M25519 Pain in unspecified shoulder: Secondary | ICD-10-CM | POA: Diagnosis not present

## 2018-07-25 DIAGNOSIS — M9902 Segmental and somatic dysfunction of thoracic region: Secondary | ICD-10-CM | POA: Diagnosis not present

## 2018-07-25 DIAGNOSIS — M5136 Other intervertebral disc degeneration, lumbar region: Secondary | ICD-10-CM | POA: Diagnosis not present

## 2018-07-25 DIAGNOSIS — M9901 Segmental and somatic dysfunction of cervical region: Secondary | ICD-10-CM | POA: Diagnosis not present

## 2018-07-26 DIAGNOSIS — F332 Major depressive disorder, recurrent severe without psychotic features: Secondary | ICD-10-CM | POA: Diagnosis not present

## 2018-07-29 ENCOUNTER — Encounter: Payer: Self-pay | Admitting: Gastroenterology

## 2018-07-29 DIAGNOSIS — F332 Major depressive disorder, recurrent severe without psychotic features: Secondary | ICD-10-CM | POA: Diagnosis not present

## 2018-07-30 DIAGNOSIS — F332 Major depressive disorder, recurrent severe without psychotic features: Secondary | ICD-10-CM | POA: Diagnosis not present

## 2018-07-31 DIAGNOSIS — M9903 Segmental and somatic dysfunction of lumbar region: Secondary | ICD-10-CM | POA: Diagnosis not present

## 2018-07-31 DIAGNOSIS — M5430 Sciatica, unspecified side: Secondary | ICD-10-CM | POA: Diagnosis not present

## 2018-07-31 DIAGNOSIS — F332 Major depressive disorder, recurrent severe without psychotic features: Secondary | ICD-10-CM | POA: Diagnosis not present

## 2018-07-31 DIAGNOSIS — M9901 Segmental and somatic dysfunction of cervical region: Secondary | ICD-10-CM | POA: Diagnosis not present

## 2018-07-31 DIAGNOSIS — M47812 Spondylosis without myelopathy or radiculopathy, cervical region: Secondary | ICD-10-CM | POA: Diagnosis not present

## 2018-07-31 DIAGNOSIS — M5136 Other intervertebral disc degeneration, lumbar region: Secondary | ICD-10-CM | POA: Diagnosis not present

## 2018-07-31 DIAGNOSIS — M9902 Segmental and somatic dysfunction of thoracic region: Secondary | ICD-10-CM | POA: Diagnosis not present

## 2018-07-31 DIAGNOSIS — M25519 Pain in unspecified shoulder: Secondary | ICD-10-CM | POA: Diagnosis not present

## 2018-07-31 DIAGNOSIS — M5030 Other cervical disc degeneration, unspecified cervical region: Secondary | ICD-10-CM | POA: Diagnosis not present

## 2018-08-01 DIAGNOSIS — F332 Major depressive disorder, recurrent severe without psychotic features: Secondary | ICD-10-CM | POA: Diagnosis not present

## 2018-08-02 DIAGNOSIS — F332 Major depressive disorder, recurrent severe without psychotic features: Secondary | ICD-10-CM | POA: Diagnosis not present

## 2018-08-05 DIAGNOSIS — F332 Major depressive disorder, recurrent severe without psychotic features: Secondary | ICD-10-CM | POA: Diagnosis not present

## 2018-08-05 DIAGNOSIS — M9901 Segmental and somatic dysfunction of cervical region: Secondary | ICD-10-CM | POA: Diagnosis not present

## 2018-08-05 DIAGNOSIS — M9903 Segmental and somatic dysfunction of lumbar region: Secondary | ICD-10-CM | POA: Diagnosis not present

## 2018-08-05 DIAGNOSIS — M47812 Spondylosis without myelopathy or radiculopathy, cervical region: Secondary | ICD-10-CM | POA: Diagnosis not present

## 2018-08-05 DIAGNOSIS — M25519 Pain in unspecified shoulder: Secondary | ICD-10-CM | POA: Diagnosis not present

## 2018-08-05 DIAGNOSIS — M5430 Sciatica, unspecified side: Secondary | ICD-10-CM | POA: Diagnosis not present

## 2018-08-05 DIAGNOSIS — M5136 Other intervertebral disc degeneration, lumbar region: Secondary | ICD-10-CM | POA: Diagnosis not present

## 2018-08-05 DIAGNOSIS — M5030 Other cervical disc degeneration, unspecified cervical region: Secondary | ICD-10-CM | POA: Diagnosis not present

## 2018-08-05 DIAGNOSIS — M9902 Segmental and somatic dysfunction of thoracic region: Secondary | ICD-10-CM | POA: Diagnosis not present

## 2018-08-06 DIAGNOSIS — F332 Major depressive disorder, recurrent severe without psychotic features: Secondary | ICD-10-CM | POA: Diagnosis not present

## 2018-08-07 DIAGNOSIS — F332 Major depressive disorder, recurrent severe without psychotic features: Secondary | ICD-10-CM | POA: Diagnosis not present

## 2018-08-09 ENCOUNTER — Encounter: Payer: Self-pay | Admitting: Family Medicine

## 2018-08-12 DIAGNOSIS — F332 Major depressive disorder, recurrent severe without psychotic features: Secondary | ICD-10-CM | POA: Diagnosis not present

## 2018-08-14 ENCOUNTER — Encounter: Payer: Self-pay | Admitting: Family Medicine

## 2018-08-14 DIAGNOSIS — F332 Major depressive disorder, recurrent severe without psychotic features: Secondary | ICD-10-CM | POA: Diagnosis not present

## 2018-08-15 ENCOUNTER — Ambulatory Visit (INDEPENDENT_AMBULATORY_CARE_PROVIDER_SITE_OTHER): Payer: Medicare Other | Admitting: Family Medicine

## 2018-08-15 ENCOUNTER — Other Ambulatory Visit: Payer: Self-pay

## 2018-08-15 ENCOUNTER — Encounter: Payer: Self-pay | Admitting: Family Medicine

## 2018-08-15 VITALS — BP 150/68 | HR 70 | Temp 98.1°F | Ht 64.0 in | Wt 261.0 lb

## 2018-08-15 DIAGNOSIS — M48061 Spinal stenosis, lumbar region without neurogenic claudication: Secondary | ICD-10-CM | POA: Diagnosis not present

## 2018-08-15 DIAGNOSIS — F3181 Bipolar II disorder: Secondary | ICD-10-CM

## 2018-08-15 DIAGNOSIS — G8929 Other chronic pain: Secondary | ICD-10-CM

## 2018-08-15 DIAGNOSIS — F332 Major depressive disorder, recurrent severe without psychotic features: Secondary | ICD-10-CM | POA: Diagnosis not present

## 2018-08-15 DIAGNOSIS — I10 Essential (primary) hypertension: Secondary | ICD-10-CM

## 2018-08-15 DIAGNOSIS — G894 Chronic pain syndrome: Secondary | ICD-10-CM

## 2018-08-15 DIAGNOSIS — M47812 Spondylosis without myelopathy or radiculopathy, cervical region: Secondary | ICD-10-CM | POA: Diagnosis not present

## 2018-08-15 DIAGNOSIS — M25519 Pain in unspecified shoulder: Secondary | ICD-10-CM | POA: Diagnosis not present

## 2018-08-15 DIAGNOSIS — M65312 Trigger thumb, left thumb: Secondary | ICD-10-CM | POA: Diagnosis not present

## 2018-08-15 DIAGNOSIS — M797 Fibromyalgia: Secondary | ICD-10-CM | POA: Diagnosis not present

## 2018-08-15 DIAGNOSIS — M9901 Segmental and somatic dysfunction of cervical region: Secondary | ICD-10-CM | POA: Diagnosis not present

## 2018-08-15 DIAGNOSIS — M25512 Pain in left shoulder: Secondary | ICD-10-CM | POA: Diagnosis not present

## 2018-08-15 DIAGNOSIS — M5030 Other cervical disc degeneration, unspecified cervical region: Secondary | ICD-10-CM | POA: Diagnosis not present

## 2018-08-15 DIAGNOSIS — M47896 Other spondylosis, lumbar region: Secondary | ICD-10-CM | POA: Diagnosis not present

## 2018-08-15 DIAGNOSIS — M9903 Segmental and somatic dysfunction of lumbar region: Secondary | ICD-10-CM | POA: Diagnosis not present

## 2018-08-15 DIAGNOSIS — M5430 Sciatica, unspecified side: Secondary | ICD-10-CM | POA: Diagnosis not present

## 2018-08-15 DIAGNOSIS — M5136 Other intervertebral disc degeneration, lumbar region: Secondary | ICD-10-CM | POA: Diagnosis not present

## 2018-08-15 DIAGNOSIS — M9902 Segmental and somatic dysfunction of thoracic region: Secondary | ICD-10-CM | POA: Diagnosis not present

## 2018-08-15 MED ORDER — MORPHINE SULFATE ER 15 MG PO TBCR: 15.0000 mg | EXTENDED_RELEASE_TABLET | Freq: Two times a day (BID) | ORAL | 0 refills | Status: DC

## 2018-08-15 MED ORDER — NITROGLYCERIN 0.4 MG SL SUBL: 0.4000 mg | SUBLINGUAL_TABLET | SUBLINGUAL | 99 refills | Status: DC | PRN

## 2018-08-15 MED ORDER — HYDROCODONE-ACETAMINOPHEN 10-325 MG PO TABS: 1.0000 | ORAL_TABLET | Freq: Four times a day (QID) | ORAL | 0 refills | Status: DC | PRN

## 2018-08-15 MED ORDER — HYDROCODONE-ACETAMINOPHEN 10-325 MG PO TABS
1.0000 | ORAL_TABLET | Freq: Four times a day (QID) | ORAL | 0 refills | Status: DC | PRN
Start: 2018-08-15 — End: 2018-11-14

## 2018-08-15 NOTE — Patient Instructions (Signed)
Trigger Finger Trigger finger (stenosing tenosynovitis) is a condition that causes a finger to get stuck in a bent position. Each finger has a tough, cord-like tissue that connects muscle to bone (tendon), and each tendon is surrounded by a tunnel of tissue (tendon sheath). To move your finger, your tendon needs to slide freely through the sheath. Trigger finger happens when the tendon or the sheath thickens, making it difficult to move your finger. Trigger finger can affect any finger or a thumb. It may affect more than one finger. Mild cases may clear up with rest and medicine. Severe cases require more treatment. What are the causes? Trigger finger is caused by a thickened finger tendon or tendon sheath. The cause of this thickening is not known. What increases the risk? The following factors may make you more likely to develop this condition:  Doing activities that require a strong grip.  Having rheumatoid arthritis, gout, or diabetes.  Being 40-60 years old.  Being a woman.  What are the signs or symptoms? Symptoms of this condition include:  Pain when bending or straightening your finger.  Tenderness or swelling where your finger attaches to the palm of your hand.  A lump in the palm of your hand or on the inside of your finger.  Hearing a popping sound when you try to straighten your finger.  Feeling a popping, catching, or locking sensation when you try to straighten your finger.  Being unable to straighten your finger.  How is this diagnosed? This condition is diagnosed based on your symptoms and a physical exam. How is this treated? This condition may be treated by:  Resting your finger and avoiding activities that make symptoms worse.  Wearing a finger splint to keep your finger in a slightly bent position.  Taking NSAIDs to relieve pain and swelling.  Injecting medicine (steroids) into the tendon sheath to reduce swelling and irritation. Injections may need to be  repeated.  Having surgery to open the tendon sheath. This may be done if other treatments do not work and you cannot straighten your finger. You may need physical therapy after surgery.  Follow these instructions at home:  Use moist heat to help reduce pain and swelling as told by your health care provider.  Rest your finger and avoid activities that make pain worse. Return to normal activities as told by your health care provider.  If you have a splint, wear it as told by your health care provider.  Take over-the-counter and prescription medicines only as told by your health care provider.  Keep all follow-up visits as told by your health care provider. This is important. Contact a health care provider if:  Your symptoms are not improving with home care. Summary  Trigger finger (stenosing tenosynovitis) causes your finger to get stuck in a bent position, and it can make it difficult and painful to straighten your finger.  This condition develops when a finger tendon or tendon sheath thickens.  Treatment starts with resting, wearing a splint, and taking NSAIDs.  In severe cases, surgery to open the tendon sheath may be needed. This information is not intended to replace advice given to you by your health care provider. Make sure you discuss any questions you have with your health care provider. Document Released: 08/12/2004 Document Revised: 10/03/2016 Document Reviewed: 10/03/2016 Elsevier Interactive Patient Education  2017 Elsevier Inc.  

## 2018-08-16 ENCOUNTER — Encounter: Payer: Self-pay | Admitting: Family Medicine

## 2018-08-16 DIAGNOSIS — M65312 Trigger thumb, left thumb: Secondary | ICD-10-CM

## 2018-08-16 DIAGNOSIS — F332 Major depressive disorder, recurrent severe without psychotic features: Secondary | ICD-10-CM | POA: Diagnosis not present

## 2018-08-16 HISTORY — DX: Trigger thumb, left thumb: M65.312

## 2018-08-16 NOTE — Progress Notes (Signed)
Patient ID: [name], female   DOB: 25-Feb-1951, 67 y.o.   MRN: [id number] Chronic Pain Syndrome - locations of pain left shoulder anteriorly, bilateral upper thorax posteriorly, left buttock, and right ankle - HPI Uses Vicodin as well as Topical balms, and chiropractery  which does help  ADE with analgesic therapies: constipation Non-allopathic therapites: chiropractery   Pain Inventory Average Pain: 7 Pain Right Now: 9  In the last 24 hours, has pain interfered with the following? (0 to 10 scale, 10 worse) General activity: 7 Relation with others: 5 Enjoyment of life: 7 What TIME of day is your pain at its worst? night Sleep (in general): One Vicodin at bedtime stops her severe restless leg symptoms. Tizanidine is not helping shoulder pain.   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  Pt feels it is helping with her mood and energy quite a bit.  She has more sessions coming up.  Dr Reece Levy (Psych) managing [place].  Prior Studies Any new tests / imaging studies since last visit?: no    Other healthcare providers involved in your care: Dr. Leonette Nutting at [facility] for fracture bone spur last month.   Any diagnostic or therapeutic changes since last visit?: immobilization left foot for fractured bone spur.  Morbid Obesity - Weight up 46 lb since weight 215 in January 19.  Reports stress pain and eating a lot in evening- She is not taking Topamax nor phentermine.   Bipolar 2 disorder -  longstanding issu - improved with TMS therapy. Continues o citalopram, vistaril, lorazepam, and trazodone. 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  CHRONIC HYPERTENSION  Disease Monitoring  Blood pressure range: not checking at home  Chest pain: no   Dyspnea: no   Claudication: no   Medication compliance: yes  Medication Side Effects  Lightheadedness: no   Urinary frequency: no   Edema: yes, trace at ankles    Preventitive Healthcare:  Exercise: no   Diet Pattern: eating most calories at evening meal and in evening snacks  Salt Restriction: no  SH: No smoking  ROS: See HPI  Physical exam Vitals:   08/15/18 1353  BP: (!) 150/68  Pulse: 70  Temp: 98.1 F (36.7 C)  SpO2: 95%   .General; Obese, groomed, distressed intermittently due to left should pain per pt Cor: RRR, no M Lung: BCTA, no acc mm use MSK: left neck with just neck pain with vertical compressions, nontender in interscap area, neg impingement.   Neuro: intact bicep and tricap DTR bil symm; 5/5 grip/elbow, shoulder strength.  Normal gait.  Psych: euthymic/normal speech/ concrete language/ goal directed thoughts

## 2018-08-16 NOTE — Assessment & Plan Note (Signed)
Established problem that has improved.  Pt taking TMS with success under direction of Dr Reece Levy.

## 2018-08-16 NOTE — Assessment & Plan Note (Signed)
Established problem Acute worsening in left shoulder pain.  See below.  Continue current therapy regiment. Rx for MS Contin 3 monthly prescriptions and 3 HC/APAP 5 mg Rxs sent into pharmacy .Review of [place] CSRS DB showed no evidence of doctor shopping.  Time for UDS opiates next visit in 3 months

## 2018-08-16 NOTE — Assessment & Plan Note (Signed)
Established problem. Stable. Continue current therapy  

## 2018-08-16 NOTE — Assessment & Plan Note (Signed)
Adequate blood pressure control.  No evidence of new end organ damage.  Tolerating medication without significant adverse effects.  Plan to continue current blood pressure regiment.   

## 2018-08-16 NOTE — Assessment & Plan Note (Signed)
New problem OTC Thumb Spica brace If worsens, referral to [facility]

## 2018-08-16 NOTE — Assessment & Plan Note (Signed)
Worsening established problem Primarily due to dietary indiscretions and inactivity. Discussed limiting sweet foods.  Will continue Phentermine and Topamax for three more months. If no improvement in weight, will stop phentermine and taper; off topamax.

## 2018-08-16 NOTE — Assessment & Plan Note (Signed)
Established problem worsened.  Exac of left trapezius and left interscapular mm Recommend cervical stretching exercises  Cont Chiropractery  Referral to Physical therapy for soft tissue release and HEP instruction.

## 2018-08-19 DIAGNOSIS — M47812 Spondylosis without myelopathy or radiculopathy, cervical region: Secondary | ICD-10-CM | POA: Diagnosis not present

## 2018-08-19 DIAGNOSIS — M5136 Other intervertebral disc degeneration, lumbar region: Secondary | ICD-10-CM | POA: Diagnosis not present

## 2018-08-19 DIAGNOSIS — M5030 Other cervical disc degeneration, unspecified cervical region: Secondary | ICD-10-CM | POA: Diagnosis not present

## 2018-08-19 DIAGNOSIS — F332 Major depressive disorder, recurrent severe without psychotic features: Secondary | ICD-10-CM | POA: Diagnosis not present

## 2018-08-19 DIAGNOSIS — M9902 Segmental and somatic dysfunction of thoracic region: Secondary | ICD-10-CM | POA: Diagnosis not present

## 2018-08-19 DIAGNOSIS — M9903 Segmental and somatic dysfunction of lumbar region: Secondary | ICD-10-CM | POA: Diagnosis not present

## 2018-08-19 DIAGNOSIS — M5430 Sciatica, unspecified side: Secondary | ICD-10-CM | POA: Diagnosis not present

## 2018-08-19 DIAGNOSIS — M9901 Segmental and somatic dysfunction of cervical region: Secondary | ICD-10-CM | POA: Diagnosis not present

## 2018-08-19 DIAGNOSIS — M25519 Pain in unspecified shoulder: Secondary | ICD-10-CM | POA: Diagnosis not present

## 2018-08-22 DIAGNOSIS — F332 Major depressive disorder, recurrent severe without psychotic features: Secondary | ICD-10-CM | POA: Diagnosis not present

## 2018-08-23 ENCOUNTER — Encounter: Payer: Self-pay | Admitting: Gastroenterology

## 2018-08-23 ENCOUNTER — Ambulatory Visit (AMBULATORY_SURGERY_CENTER): Payer: Federal, State, Local not specified - PPO

## 2018-08-23 VITALS — Ht 64.0 in | Wt 262.2 lb

## 2018-08-23 DIAGNOSIS — Z1211 Encounter for screening for malignant neoplasm of colon: Secondary | ICD-10-CM

## 2018-08-23 DIAGNOSIS — F332 Major depressive disorder, recurrent severe without psychotic features: Secondary | ICD-10-CM | POA: Diagnosis not present

## 2018-08-23 MED ORDER — NA SULFATE-K SULFATE-MG SULF 17.5-3.13-1.6 GM/177ML PO SOLN: 1.0000 | Freq: Once | ORAL | 0 refills | Status: AC

## 2018-08-23 NOTE — Progress Notes (Signed)
No egg or soy allergy known to patient  No issues with past sedation with any surgeries  or procedures, no intubation problems  No diet pills per patient No home 02 use per patient  No blood thinners per patient  Pt denies issues with constipation  No A fib or A flutter  EMMI video sent to pt's e mail  

## 2018-09-02 ENCOUNTER — Ambulatory Visit: Payer: Medicare Other | Attending: Family Medicine | Admitting: Physical Therapy

## 2018-09-02 ENCOUNTER — Encounter: Payer: Self-pay | Admitting: Physical Therapy

## 2018-09-02 DIAGNOSIS — G8929 Other chronic pain: Secondary | ICD-10-CM | POA: Diagnosis not present

## 2018-09-02 DIAGNOSIS — M25512 Pain in left shoulder: Secondary | ICD-10-CM | POA: Diagnosis not present

## 2018-09-02 NOTE — Patient Instructions (Signed)
Access Code: [email or web address]  URL: [email or web address]  Date: 09/02/2018  Prepared by: Elsie Ra   Exercises  Seated Cervical Sidebending Stretch - 3 sets - 30 sec hold - 2x daily - 6x weekly  Standing Shoulder Posterior Capsule Stretch - 10 reps - 3 sets - 2x daily - 6x weekly  Standing Shoulder Flexion Wall Slide - 10 reps - 3 sets - 2x daily - 6x weekly  Standing Shoulder Abduction Slides at Wall - 10 reps - 3 sets - 2x daily - 6x weekly  Standing Shoulder Row with Anchored Resistance - 10 reps - 3 sets - 2x daily - 6x weekly  Standing Shoulder External Rotation with Resistance - 10 reps - 3 sets - 2x daily - 6x weekly

## 2018-09-02 NOTE — Therapy (Signed)
[place], [place], [address] Phone: [phone]   Fax:  [phone]  Physical Therapy Evaluation  Patient Details  Name: [name] MRN: [id number] Date of Birth: 05-22-1951 Referring Provider (PT): Lissa Morales, MD   Encounter Date: 09/02/2018  PT End of Session - 09/02/18 1117    Visit Number  1    Number of Visits  12    Date for PT Re-Evaluation  10/14/18    Authorization Type  MCR,BCBS    PT Start Time  1157    PT Stop Time  1102    PT Time Calculation (min)  47 min    Activity Tolerance  Patient tolerated treatment well       Past Medical History:  Diagnosis Date  . Adrenal nodule ([place]) 04/08/2011   04/08/11 Abdominal CT showed Left adrenal nodule which is tachnically indeterminate but most likely an adenoma.  It is 1.7 cm and 42 HU on portal venous phase image. 31 HU on delayed images.   01/09/2012 Abdominal CT w/ & w/o CM: Stable benign left adrenal adenoma. No further specific follow-up is needed for this finding.    . Allergic rhinitis 01/03/2007   Qualifier: Diagnosis of  By: Drucie Ip    . Allergy   . Anemia    hx of  in 20's  . Anxiety   . Bipolar disorder ([place])   . Carpal tunnel syndrome 02/14/2007   S/P carpel tunnel release bilaterally.    . Cataracts, bilateral   . Chronic pain syndrome 12/24/2008  . 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 COPD WITHOUT EXACERBATION 01/03/2007   Qualifier: Diagnosis of  By: Drucie Ip  emphysema  . DISC WITH RADICULOPATHY 01/03/2007   Qualifier: Diagnosis of  By: Drucie Ip    . Emphysema of lung ([place])   . Esophageal reflux 01/03/2007   Centricity Description: GASTROESOPHAGEAL REFLUX, NO ESOPHAGITIS Qualifier: Diagnosis of  By: Drucie Ip   Centricity Description: GERD Qualifier: Diagnosis of  By: Tye Savoy MD, Tommi Rumps    . External hemorrhoids s/p hemorrhoidectomy 03/28/2018  03/28/2018  . Functional gastrointestinal disorder 06/18/2008   Functional Gastrointestinal Disorder with frequent eructation and bloating sensation with acute flares.  Responsive to Compazine and Bentyl.  Takes a few days to calm down.    Marland Kitchen Heel spur, fracture, left 04/17/2018  . HEMORRHOIDS, NOS 01/03/2007   Qualifier: Diagnosis of  By: Drucie Ip    . Hepatic steatosis 04/12/2011  . Herpes simplex labialis 05/11/2011  . Hip osteoarthritis 05/23/2007   MRI Pelvis (03/13/07) Bilateral osteoarthritis of the hips, left greater than right.  The  patient has a slightly more prominent hip effusion on the left than  the right.  No other significant abnormality.    . History of MRSA infection 04/28/2011  . HYPERCHOLESTEROLEMIA 12/24/2008  . Hypertension   . HYPERTENSION, BENIGN SYSTEMIC 01/03/2007   Qualifier: Diagnosis of  By: Drucie Ip    . HYPERTRIGLYCERIDEMIA 11/11/2007   Qualifier: Diagnosis of  By: McDiarmid MD, Sherren Mocha    . Hypokalemia 12/12/2017  . Hypothyroidism 02/15/2007   Qualifier: Diagnosis of  By: McDiarmid MD, Sherren Mocha    . HYPOTHYROIDISM, BORDERLINE 02/15/2007  . Irritable bowel syndrome 01/03/2007   Qualifier: Diagnosis of  By: Drucie Ip    . LACTOSE INTOLERANCE 03/10/2008  . Major depressive disorder, recurrent episode ([place]) 01/03/2007   Qualifier: Diagnosis of  By: Drucie Ip    . Obesity (BMI  30.0-34.9) 09/30/2007   Has lost 55 lbs since easter. Goal of <190 by 08/05/12.     . Obesity, Class III, BMI 40-49.9 (morbid obesity) ([place]) 09/30/2007       . Osteoarthritis of spine 01/03/2007   MR I Lumbar (08/03): GeneralDDD; L2-3 Large  HNP.  Mod pos  hnp - 12/14/2005, smhnpw/irritL4root - 12/14/2005    . OSTEOARTHRITIS, HANDS, BILATERAL 06/18/2008   Qualifier: Diagnosis of  By: McDiarmid MD, Sherren Mocha    . PATELLO FEMORAL STRESS SYNDROME 01/03/2007   Qualifier: Diagnosis of  By: Drucie Ip    . Periodic limb movements of sleep 12/05/2010   Diagnosed on Polysomnography testing Fall 2011.      Marland Kitchen Pneumonia    walking pneumonia in the 90's  . Pre-diabetes 12/25/2008   Qualifier: Diagnosis of  By: McDiarmid MD, Sherren Mocha    . Prolapsed internal hemorrhoids, grade 3, s/p ligation/pexy/hemorrhoidectomy 03/28/2018 03/28/2018  . RESTLESS LEGS SYNDROME 09/11/2007   Polysomnography (10/2010, Dr Keturah Barre, interpreter. ) showed periodic limb movements that frequently awoke patient from sleep with arousal index of 4 per hour. Dr Danton Sewer (Sleep Specialist) thought her RLS was the major contributor to patient poor sleep condition, more than any sleep apnea.  He recommended considering opamine agonist (either requip or mirapex) to see if this will help.     . Rosacea, acne 10/24/2011  . SIGMOID POLYP 01/23/2008  . Solitary lung nodule 04/08/2011  . Stress incontinence   . Synovial cyst of lumbar facet joint 02/17/2016  . Urticaria, chronic 05/22/2011  . VITAMIN D DEFICIENCY 03/03/2010    Past Surgical History:  Procedure Laterality Date  . APPENDECTOMY    . bladder tack  1998   Dr Jeffie Pollock (urology)  . BUNIONECTOMY  1992   bilateral feet with pins in great toes  . CARDIAC CATHETERIZATION    . CARPAL TUNNEL RELEASE  2010   Bilateral, Dr Theodis Sato  . COLONOSCOPY  2004   Dr Verdia Kuba (GI)  . CYSTOCELE REPAIR  1998    Dr Gertie Fey (GYN)-  . ESOPHAGOGASTRODUODENOSCOPY    . EVALUATION UNDER ANESTHESIA WITH HEMORRHOIDECTOMY N/A 03/28/2018   Procedure: ANORECTAL EXAM UNDER ANESTHESIA lateral internal partial sphincterotomy, hemorrhoidal ligation pixie, HEMORRHOIDECTOMY;  Surgeon: Michael Boston, MD;  Location: [facility];  Service: General;  Laterality: N/A;  . EXAM ANORECTAL W/ ULTRASOUND     Dr. Johney Maine 03-28-18   . EXPLORATORY LAPAROTOMY    . FACIAL LACERATIONS REPAIR     Facial Laceration repair as adolescent   . INJECTION HIP INTRA ARTICULAR  2008   Left Hip fluoroscopically-guided corticosteorid injection for painful osteoarthritis with good effect (06/2007)  . LAPAROSCOPY FOR ECTOPIC PREGNANCY     . LUMBAR LAMINECTOMY/DECOMPRESSION MICRODISCECTOMY N/A 06/21/2016   Procedure: MICRO LUMBAR DECOMPRESSION L4-L5 AND REMOVAL OF SYNOVIAL CYST    1 LEVEL;  Surgeon: Susa Day, MD;  Location: [facility];  Service: Orthopedics;  Laterality: N/A;  . NM MYOVIEW LTD  2011   Dr Daneen Schick, III (Card)  . RECTOCELE REPAIR  1998   Dr Gertie Fey (GYN)  . SPHINCTEROTOMY N/A 03/28/2018   Procedure: ANAL SPHINCTEROTOMY;  Surgeon: Michael Boston, MD;  Location: [facility];  Service: General;  Laterality: N/A;  . UPPER GASTROINTESTINAL ENDOSCOPY      There were no vitals filed for this visit.   Subjective Assessment - 09/02/18 1021    Subjective  Pt reports Lt shoulder/neck/trap pain since this spring. She did try chiropractor care which helped some. 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    Pertinent History  PMH: Lt trigger thumb,fibromyalgia, chronic pain syndrome, morbid obesity,anx,dep,COPD,bipolar, carpal tunnel, back surgery for laminectomy/microdisectomy 2017    Limitations  Lifting;House hold activities    Diagnostic tests  no recent imaging    Patient Stated Goals  "get this muscle and thumb straightened out"    Currently in Pain?  Yes    Pain Score  5     Pain Location  Shoulder    Pain Descriptors / Indicators  Dull    Pain Onset  More than a month ago    Pain Frequency  Constant    Aggravating Factors   using her arm for anything    Pain Relieving Factors  putting arm behind back, KT tape, biofreeeze         [facility] PT Assessment - 09/02/18 0001      Assessment   Medical Diagnosis  Lt shoulder pain    Referring Provider (PT)  Lissa Morales, MD    Onset Date/Surgical Date  --   6 months   Hand Dominance  Left    Next MD Visit  --   3 months   Prior Therapy  PT for back      Precautions   Precautions  None      Balance Screen   Has the patient fallen in the past 6 months   No      [place] residence      Prior Function   Level of Independence  Independent      Cognition   Overall Cognitive Status  Within Functional Limits for tasks assessed      Observation/Other Assessments   Focus on Therapeutic Outcomes (FOTO)   50%       Sensation   Light Touch  Appears Intact      ROM / Strength   AROM / PROM / Strength  AROM;Strength      AROM   Overall AROM Comments  Lt shoulder IR/ER WNL    AROM Assessment Site  Shoulder    Right/Left Shoulder  Left    Left Shoulder Flexion  100 Degrees    Left Shoulder ABduction  120 Degrees      Strength   Overall Strength Comments  Rt UE overall 5/5    Strength Assessment Site  Shoulder    Right/Left Shoulder  Left    Left Shoulder Flexion  4/5    Left Shoulder ABduction  4/5    Left Shoulder Internal Rotation  4+/5    Left Shoulder External Rotation  4/5      Flexibility   Soft Tissue Assessment /Muscle Length  --   tight in Lt UT     Special Tests   Other special tests  +impingement testing                Objective measurements completed on examination: See above findings.      [facility] Adult PT Treatment/Exercise - 09/02/18 0001      Modalities   Modalities  Electrical Stimulation;Moist Heat      Moist Heat Therapy   Number Minutes Moist Heat  15 Minutes    Moist Heat Location  Shoulder   and thumb wrapped in fist     Acupuncturist  Stimulation Location  Lt UT and shoulder    Electrical Stimulation Action  IFC    Electrical Stimulation Parameters  tolerance    Electrical Stimulation Goals  Pain;Tone      Manual Therapy   Manual therapy comments  KT tape to Lt UT             PT Education - 09/02/18 1117    Education Details  HEP, POC, TENS,KT tape    Person(s) Educated  Patient    Methods  Explanation;Demonstration;Tactile cues;Verbal cues;Handout    Comprehension  Verbalized understanding;Need further  instruction          PT Long Term Goals - 09/02/18 1126      PT LONG TERM GOAL #1   Title  Pt will be I and compliant with HEP. 6 weeks 10/14/18    Status  New      PT LONG TERM GOAL #2   Title  Pt will improve Lt shoulder AROM to [facility]. 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 6 weeks 10/14/18      PT LONG TERM GOAL #4   Title  Pt will increase FOTO to less than 41% limited. 6 weeks 10/14/18    Status  New             Plan - 09/02/18 1119    Clinical Impression Statement  Pt presents with Lt shoulder pain and impingement with spasm in Lt upper trap. She has decreased Lt shoulder strength and stabilizaiton, decreased AROM, decreased reaching and lifting abiliites and increased pain. She will benefit from skilled PT to address her deficits.     History and Personal Factors relevant to plan of care:  PMH: Lt trigger thumb,fibromyalgia, chronic pain syndrome, morbid obesity,anx,dep,COPD,bipolar, carpal tunnel, back surgery for laminectomy/microdisectomy 2017    Clinical Presentation  Evolving    Clinical Presentation due to:  worsenting pain and co morbidities    Clinical Decision Making  Moderate    Rehab Potential  Good    PT Frequency  2x / week   1-2   PT Duration  6 weeks    PT Treatment/Interventions  ADLs/Self Care Home Management;Cryotherapy;Electrical Stimulation;Iontophoresis 4mg /ml Dexamethasone;Moist Heat;Ultrasound;Therapeutic activities;Therapeutic exercise;Neuromuscular re-education;Manual techniques;Passive range of motion;Dry needling;Taping;Joint Manipulations    PT Next Visit Plan  review HEP, Lt shoulder stretching and strengthening, consider MT and modalties PRN.    Consulted and Agree with Plan of Care  Patient       Patient will benefit from skilled therapeutic intervention in order to improve the following deficits and impairments:   Decreased activity tolerance, Decreased endurance, Decreased range of motion, Decreased strength, Impaired flexibility, Increased fascial restricitons, Increased muscle spasms, Obesity, Postural dysfunction, Pain  Visit Diagnosis: Chronic left shoulder pain     Problem List Patient Active Problem List   Diagnosis Date Noted  . Trigger thumb of left hand 08/16/2018  . Work-related condition 05/17/2018  . Coronary artery calcification seen on CAT scan 12/03/2017  . Medication management 12/14/2016  . Spinal stenosis of lumbar region 06/21/2016  . Atherosclerosis of abdominal aorta ([place]) 05/12/2016  . Encounter for chronic pain management 12/03/2014  . Metabolic syndrome 98/92/1194  . Periodic limb movements of sleep 12/05/2010  . VITAMIN D DEFICIENCY 03/03/2010  . Pre-diabetes 12/25/2008  . HYPERCHOLESTEROLEMIA 12/24/2008  .  Chronic pain syndrome 12/24/2008  . Functional gastrointestinal disorder 06/18/2008  . SIGMOID POLYP 01/23/2008  . HYPERTRIGLYCERIDEMIA 11/11/2007  . Obesity, Class III, BMI 40-49.9 (morbid obesity) ([place]) 09/30/2007  . RESTLESS LEGS SYNDROME 09/11/2007  . Fibromyalgia syndrome 07/15/2007  . Hip osteoarthritis 05/23/2007  . Hypothyroidism 02/15/2007  . Bipolar 2 disorder ([place]) 01/03/2007  . HYPERTENSION, BENIGN SYSTEMIC 01/03/2007  . Allergic rhinitis 01/03/2007  . Stage 1 mild COPD by GOLD classification ([place]) 01/03/2007  . Esophageal reflux 01/03/2007  . Irritable bowel syndrome 01/03/2007  . Osteoarthritis of spine 01/03/2007    Debbe Odea, PT, DPT 09/02/2018, 11:43 AM  [facility] [address] [place], [place], [address] Phone: [phone]   Fax:  [phone]  Name: [name] MRN: [id number] Date of Birth: 09/16/1951

## 2018-09-05 ENCOUNTER — Encounter: Payer: Medicare Other | Admitting: Physical Therapy

## 2018-09-06 ENCOUNTER — Encounter: Payer: Medicare Other | Admitting: Gastroenterology

## 2018-09-09 ENCOUNTER — Ambulatory Visit: Payer: Medicare Other | Attending: Family Medicine | Admitting: Physical Therapy

## 2018-09-09 DIAGNOSIS — G8929 Other chronic pain: Secondary | ICD-10-CM | POA: Diagnosis not present

## 2018-09-09 DIAGNOSIS — M25512 Pain in left shoulder: Secondary | ICD-10-CM | POA: Diagnosis not present

## 2018-09-09 NOTE — Therapy (Addendum)
[place], [place], [address] Phone: [phone]   Fax:  [phone]  Physical Therapy Treatment/Discharge addendum  Patient Details  Name: [name] MRN: [id number] Date of Birth: [birth year] Referring Provider (PT): Lissa Morales, MD   Encounter Date: 09/09/2018  PT End of Session - 09/09/18 1225    Visit Number  2    Number of Visits  12    Date for PT Re-Evaluation  10/14/18    Authorization Type  MCR,BCBS    PT Start Time  4818    PT Stop Time  1232    PT Time Calculation (min)  47 min    Activity Tolerance  Patient tolerated treatment well    Behavior During Therapy  [facility] for tasks assessed/performed       Past Medical History:  Diagnosis Date  . Adrenal nodule ([place]) 04/08/2011   04/08/11 Abdominal CT showed Left adrenal nodule which is tachnically indeterminate but most likely an adenoma.  It is 1.7 cm and 42 HU on portal venous phase image. 31 HU on delayed images.   01/09/2012 Abdominal CT w/ & w/o CM: Stable benign left adrenal adenoma. No further specific follow-up is needed for this finding.    . Allergic rhinitis 01/03/2007   Qualifier: Diagnosis of  By: Drucie Ip    . Allergy   . Anemia    hx of  in 20's  . Anxiety   . Bipolar disorder ([place])   . Carpal tunnel syndrome 02/14/2007   S/P carpel tunnel release bilaterally.    . Cataracts, bilateral   . Chronic pain syndrome 12/24/2008  . 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 COPD WITHOUT EXACERBATION 01/03/2007   Qualifier: Diagnosis of  By: Drucie Ip  emphysema  . DISC WITH RADICULOPATHY 01/03/2007   Qualifier: Diagnosis of  By: Drucie Ip    . Emphysema of lung ([place])   . Esophageal reflux 01/03/2007   Centricity Description: GASTROESOPHAGEAL REFLUX, NO ESOPHAGITIS Qualifier: Diagnosis of  By: Drucie Ip   Centricity Description: GERD Qualifier: Diagnosis of  By:  Tye Savoy MD, Tommi Rumps    . External hemorrhoids s/p hemorrhoidectomy 03/28/2018 03/28/2018  . Functional gastrointestinal disorder 06/18/2008   Functional Gastrointestinal Disorder with frequent eructation and bloating sensation with acute flares.  Responsive to Compazine and Bentyl.  Takes a few days to calm down.    Marland Kitchen Heel spur, fracture, left 04/17/2018  . HEMORRHOIDS, NOS 01/03/2007   Qualifier: Diagnosis of  By: Drucie Ip    . Hepatic steatosis 04/12/2011  . Herpes simplex labialis 05/11/2011  . Hip osteoarthritis 05/23/2007   MRI Pelvis (03/13/07) Bilateral osteoarthritis of the hips, left greater than right.  The  patient has a slightly more prominent hip effusion on the left than  the right.  No other significant abnormality.    . History of MRSA infection 04/28/2011  . HYPERCHOLESTEROLEMIA 12/24/2008  . Hypertension   . HYPERTENSION, BENIGN SYSTEMIC 01/03/2007   Qualifier: Diagnosis of  By: Drucie Ip    . HYPERTRIGLYCERIDEMIA 11/11/2007   Qualifier: Diagnosis of  By: McDiarmid MD, Sherren Mocha    . Hypokalemia 12/12/2017  . Hypothyroidism 02/15/2007   Qualifier: Diagnosis of  By: McDiarmid MD, Sherren Mocha    . HYPOTHYROIDISM, BORDERLINE 02/15/2007  . Irritable bowel syndrome 01/03/2007   Qualifier: Diagnosis of  By: Drucie Ip    . LACTOSE INTOLERANCE 03/10/2008  . Major depressive disorder, recurrent episode ([place]) 01/03/2007   Qualifier:  Diagnosis of  By: Drucie Ip    . Obesity (BMI 30.0-34.9) 09/30/2007   Has lost 55 lbs since easter. Goal of <190 by 08/05/12.     . Obesity, Class III, BMI 40-49.9 (morbid obesity) ([place]) 09/30/2007       . Osteoarthritis of spine 01/03/2007   MR I Lumbar (08/03): GeneralDDD; L2-3 Large  HNP.  Mod pos  hnp - 12/14/2005, smhnpw/irritL4root - 12/14/2005    . OSTEOARTHRITIS, HANDS, BILATERAL 06/18/2008   Qualifier: Diagnosis of  By: McDiarmid MD, Sherren Mocha    . PATELLO FEMORAL STRESS SYNDROME 01/03/2007   Qualifier: Diagnosis of  By: Drucie Ip    . Periodic limb  movements of sleep 12/05/2010   Diagnosed on Polysomnography testing Fall 2011.     Marland Kitchen Pneumonia    walking pneumonia in the 90's  . Pre-diabetes 12/25/2008   Qualifier: Diagnosis of  By: McDiarmid MD, Sherren Mocha    . Prolapsed internal hemorrhoids, grade 3, s/p ligation/pexy/hemorrhoidectomy 03/28/2018 03/28/2018  . RESTLESS LEGS SYNDROME 09/11/2007   Polysomnography (10/2010, Dr Keturah Barre, interpreter. ) showed periodic limb movements that frequently awoke patient from sleep with arousal index of 4 per hour. Dr Danton Sewer (Sleep Specialist) thought her RLS was the major contributor to patient poor sleep condition, more than any sleep apnea.  He recommended considering opamine agonist (either requip or mirapex) to see if this will help.     . Rosacea, acne 10/24/2011  . SIGMOID POLYP 01/23/2008  . Solitary lung nodule 04/08/2011  . Stress incontinence   . Synovial cyst of lumbar facet joint 02/17/2016  . Urticaria, chronic 05/22/2011  . VITAMIN D DEFICIENCY 03/03/2010    Past Surgical History:  Procedure Laterality Date  . APPENDECTOMY    . bladder tack  1998   Dr Jeffie Pollock (urology)  . BUNIONECTOMY  1992   bilateral feet with pins in great toes  . CARDIAC CATHETERIZATION    . CARPAL TUNNEL RELEASE  2010   Bilateral, Dr Theodis Sato  . COLONOSCOPY  2004   Dr Verdia Kuba (GI)  . CYSTOCELE REPAIR  1998    Dr Gertie Fey (GYN)-  . ESOPHAGOGASTRODUODENOSCOPY    . EVALUATION UNDER ANESTHESIA WITH HEMORRHOIDECTOMY N/A 03/28/2018   Procedure: ANORECTAL EXAM UNDER ANESTHESIA lateral internal partial sphincterotomy, hemorrhoidal ligation pixie, HEMORRHOIDECTOMY;  Surgeon: Michael Boston, MD;  Location: [facility];  Service: General;  Laterality: N/A;  . EXAM ANORECTAL W/ ULTRASOUND     Dr. Johney Maine 03-28-18   . EXPLORATORY LAPAROTOMY    . FACIAL LACERATIONS REPAIR     Facial Laceration repair as adolescent   . INJECTION HIP INTRA ARTICULAR  2008   Left Hip fluoroscopically-guided corticosteorid injection for painful  osteoarthritis with good effect (06/2007)  . LAPAROSCOPY FOR ECTOPIC PREGNANCY    . LUMBAR LAMINECTOMY/DECOMPRESSION MICRODISCECTOMY N/A 06/21/2016   Procedure: MICRO LUMBAR DECOMPRESSION L4-L5 AND REMOVAL OF SYNOVIAL CYST    1 LEVEL;  Surgeon: Susa Day, MD;  Location: [facility];  Service: Orthopedics;  Laterality: N/A;  . NM MYOVIEW LTD  2011   Dr Daneen Schick, III (Card)  . RECTOCELE REPAIR  1998   Dr Gertie Fey (GYN)  . SPHINCTEROTOMY N/A 03/28/2018   Procedure: ANAL SPHINCTEROTOMY;  Surgeon: Michael Boston, MD;  Location: [facility];  Service: General;  Laterality: N/A;  . UPPER GASTROINTESTINAL ENDOSCOPY      There were no vitals filed for this visit.  Subjective Assessment - 09/09/18 1219    Subjective  Pt relays she is not doing well  with her shoulder and that a relative just passed away. She is having some numbness and burning down her Lt arm.     Currently in Pain?  Yes    Pain Score  7     Pain Location  Shoulder    Pain Orientation  Left    Pain Descriptors / Indicators  Sharp;Pins and needles;Burning                       [facility] Adult PT Treatment/Exercise - 09/09/18 0001      Exercises   Exercises  Shoulder      Shoulder Exercises: Seated   Row  AROM;20 reps    Other Seated Exercises  neck rolls and chin tucks  X 20 ea    Other Seated Exercises  seated gripping for Lt hand green Dgrip X25      Shoulder Exercises: Standing   External Rotation  Left;15 reps    Theraband Level (Shoulder External Rotation)  Level 1 (Yellow)    Internal Rotation  Left;10 reps    Theraband Level (Shoulder Internal Rotation)  Level 1 (Yellow)      Shoulder Exercises: Pulleys   Flexion  2 minutes    Scaption  2 minutes      Shoulder Exercises: ROM/Strengthening   [place]anger  flexion and scaption X 15 ea       Shoulder Exercises: Stretch   Cross Chest Stretch  2 reps;30 seconds    Other Shoulder Stretches  UT and levator stretch 2X30 sec ea for Lt side      Modalities    Modalities  Electrical Stimulation;Moist Heat      Moist Heat Therapy   Number Minutes Moist Heat  15 Minutes    Moist Heat Location  Shoulder   Lt hand     Electrical Stimulation   Electrical Stimulation Location  Lt UT and shoulder    Electrical Stimulation Action  IFC    Electrical Stimulation Parameters  tolerance    Electrical Stimulation Goals  Pain;Tone             PT Education - 09/09/18 1225    Education Details  HEP review    Person(s) Educated  Patient    Methods  Explanation;Demonstration;Verbal cues    Comprehension  Verbalized understanding;Returned demonstration;Need further instruction          PT Long Term Goals - 09/02/18 1126      PT LONG TERM GOAL #1   Title  Pt will be I and compliant with HEP. 6 weeks 10/14/18    Status  New      PT LONG TERM GOAL #2   Title  Pt will improve Lt shoulder AROM to [facility]. 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 6 weeks 10/14/18      PT LONG TERM GOAL #4   Title  Pt will increase FOTO to less than 41% limited. 6 weeks 10/14/18    Status  New            Plan - 09/09/18 1226    Clinical Impression Statement  Session focused on Lt shoulder ROM and Lt shoulder/scapular strengthening and stabilizaiton with good tolerance. She did appear to have more ROM today. Session ended with MHP and TENS as she relays  this helped last session.     Rehab Potential  Good    PT Frequency  2x / week   1-2   PT Duration  6 weeks    PT Treatment/Interventions  ADLs/Self Care Home Management;Cryotherapy;Electrical Stimulation;Iontophoresis 41m/ml Dexamethasone;Moist Heat;Ultrasound;Therapeutic activities;Therapeutic exercise;Neuromuscular re-education;Manual techniques;Passive range of motion;Dry needling;Taping;Joint Manipulations    PT Next Visit Plan  Lt shoulder stretching and strengthening, consider MT and  modalties PRN.    Consulted and Agree with Plan of Care  Patient       Patient will benefit from skilled therapeutic intervention in order to improve the following deficits and impairments:  Decreased activity tolerance, Decreased endurance, Decreased range of motion, Decreased strength, Impaired flexibility, Increased fascial restricitons, Increased muscle spasms, Obesity, Postural dysfunction, Pain  Visit Diagnosis: Chronic left shoulder pain     Problem List Patient Active Problem List   Diagnosis Date Noted  . Trigger thumb of left hand 08/16/2018  . Work-related condition 05/17/2018  . Coronary artery calcification seen on CAT scan 12/03/2017  . Medication management 12/14/2016  . Spinal stenosis of lumbar region 06/21/2016  . Atherosclerosis of abdominal aorta ([place] 05/12/2016  . Encounter for chronic pain management 12/03/2014  . Metabolic syndrome 041/14/6431 . Periodic limb movements of sleep 12/05/2010  . VITAMIN D DEFICIENCY 03/03/2010  . Pre-diabetes 12/25/2008  . HYPERCHOLESTEROLEMIA 12/24/2008  . Chronic pain syndrome 12/24/2008  . Functional gastrointestinal disorder 06/18/2008  . SIGMOID POLYP 01/23/2008  . HYPERTRIGLYCERIDEMIA 11/11/2007  . Obesity, Class III, BMI 40-49.9 (morbid obesity) ([place] 09/30/2007  . RESTLESS LEGS SYNDROME 09/11/2007  . Fibromyalgia syndrome 07/15/2007  . Hip osteoarthritis 05/23/2007  . Hypothyroidism 02/15/2007  . Bipolar 2 disorder ([place] 01/03/2007  . HYPERTENSION, BENIGN SYSTEMIC 01/03/2007  . Allergic rhinitis 01/03/2007  . Stage 1 mild COPD by GOLD classification ([place] 01/03/2007  . Esophageal reflux 01/03/2007  . Irritable bowel syndrome 01/03/2007  . Osteoarthritis of spine 01/03/2007    BDebbe Odea PT, DPT 09/09/2018, 12:28 PM  [facility]oo[place] [address]Phone: [phone]  Fax:  [phone] Name: BPREET MANGANOMRN:  [id number] of Birth: [birth year]  PHYSICAL THERAPY DISCHARGE SUMMARY  Visits from Start of Care: 2 Current functional level related to goals / functional outcomes: See above   Remaining deficits: See above   Education / Equipment: HEP Plan: Patient agrees to discharge.  Patient goals were not met. Patient is being discharged due to not returning since the last visit.  ?????    BElsie Ra PT, DPT 10/14/18 11:49 AM

## 2018-09-11 ENCOUNTER — Ambulatory Visit: Payer: Medicare Other

## 2018-09-18 ENCOUNTER — Telehealth: Payer: Self-pay | Admitting: Physical Therapy

## 2018-09-18 ENCOUNTER — Ambulatory Visit: Payer: Medicare Other | Admitting: Physical Therapy

## 2018-09-18 NOTE — Telephone Encounter (Signed)
Called Pt at 11:38 am on 09/18/18 to follow up after pt did not show up for her PT treatment session. There was no answer, so message was left for pt to call and cancel any further appointments that she needs to miss or to follow up with new appointment times with our front office.   Kearney Hard, PT 09/18/18 11:42 AM

## 2018-09-23 ENCOUNTER — Telehealth: Payer: Self-pay | Admitting: Family Medicine

## 2018-09-23 NOTE — Telephone Encounter (Signed)
**  After Hours/ Emergency Line Call*  Received a call to report that [name] that her heart rate is 86 and she has cold sweats and "they tell me to eat fiber and I think I may have had too much" and she's shivering. This happened twice in the last couple of months and those times her HR got to 130 and said she was "in cardio according to my fitbit." Patient talks in long sentences. She is scared, and was thinking about calling an ambulance because she can't drive herself. She is worried she couldn't get downstairs to unlock the door for EMS. Chewing on something like gas x she says. She says she is going to call EMS now. Red flags discussed.  Will forward to PCP.  Ralene Ok, MD PGY-3, [facility]

## 2018-09-24 NOTE — Telephone Encounter (Signed)
I spoke with [name] by phone.  This sounds like the intermittent exacerbation of her functional bowel disorder syndrome of bloating, burping, flatulence, and increased anxiety. She recognizes that she swallows a lot of air when she has these flare ups.   She is feeling a bit better this morning.  We discussed the use of her Bentyl, Simethicone and Compazine during attacks. She plans to stay home and take it easy today.    She is aware that should her symptoms become unbearable, she is to go to the ED.

## 2018-10-06 ENCOUNTER — Other Ambulatory Visit: Payer: Self-pay | Admitting: Family Medicine

## 2018-10-06 DIAGNOSIS — M7730 Calcaneal spur, unspecified foot: Secondary | ICD-10-CM

## 2018-10-06 HISTORY — DX: Calcaneal spur, unspecified foot: M77.30

## 2018-10-08 ENCOUNTER — Other Ambulatory Visit: Payer: Self-pay

## 2018-10-09 DIAGNOSIS — F332 Major depressive disorder, recurrent severe without psychotic features: Secondary | ICD-10-CM | POA: Diagnosis not present

## 2018-10-09 MED ORDER — LEVOTHYROXINE SODIUM 50 MCG PO TABS: ORAL_TABLET | ORAL | 3 refills | Status: DC

## 2018-11-04 ENCOUNTER — Telehealth: Payer: Self-pay | Admitting: Gastroenterology

## 2018-11-04 ENCOUNTER — Encounter: Payer: Medicare Other | Admitting: Gastroenterology

## 2018-11-04 NOTE — Telephone Encounter (Signed)
Pt called and advised that she will not be making the appt for her proc today due to being sick. She talk with the oncall doctor and was advised to cancel the proc today.

## 2018-11-04 NOTE — Telephone Encounter (Signed)
Understood, and I received the message earlier this AM.  We will wait to hear from her when she is ready to reschedule.

## 2018-11-07 DIAGNOSIS — F4312 Post-traumatic stress disorder, chronic: Secondary | ICD-10-CM | POA: Diagnosis not present

## 2018-11-07 DIAGNOSIS — F3181 Bipolar II disorder: Secondary | ICD-10-CM | POA: Diagnosis not present

## 2018-11-14 ENCOUNTER — Telehealth: Payer: Self-pay

## 2018-11-14 ENCOUNTER — Ambulatory Visit (INDEPENDENT_AMBULATORY_CARE_PROVIDER_SITE_OTHER): Payer: Medicare Other | Admitting: Family Medicine

## 2018-11-14 ENCOUNTER — Encounter: Payer: Self-pay | Admitting: Family Medicine

## 2018-11-14 ENCOUNTER — Other Ambulatory Visit: Payer: Self-pay

## 2018-11-14 VITALS — BP 124/60 | HR 70 | Temp 98.1°F | Ht 64.0 in | Wt 269.0 lb

## 2018-11-14 DIAGNOSIS — G2581 Restless legs syndrome: Secondary | ICD-10-CM | POA: Diagnosis not present

## 2018-11-14 DIAGNOSIS — M47896 Other spondylosis, lumbar region: Secondary | ICD-10-CM

## 2018-11-14 DIAGNOSIS — G894 Chronic pain syndrome: Secondary | ICD-10-CM | POA: Diagnosis not present

## 2018-11-14 DIAGNOSIS — I10 Essential (primary) hypertension: Secondary | ICD-10-CM

## 2018-11-14 DIAGNOSIS — M48061 Spinal stenosis, lumbar region without neurogenic claudication: Secondary | ICD-10-CM

## 2018-11-14 DIAGNOSIS — F3181 Bipolar II disorder: Secondary | ICD-10-CM

## 2018-11-14 DIAGNOSIS — I700 Atherosclerosis of aorta: Secondary | ICD-10-CM

## 2018-11-14 MED ORDER — METOPROLOL TARTRATE 50 MG PO TABS: 50.0000 mg | ORAL_TABLET | Freq: Two times a day (BID) | ORAL | 3 refills | Status: DC

## 2018-11-14 MED ORDER — MORPHINE SULFATE ER 15 MG PO TBCR: 15.0000 mg | EXTENDED_RELEASE_TABLET | Freq: Two times a day (BID) | ORAL | 0 refills | Status: DC

## 2018-11-14 MED ORDER — HYDROCODONE-ACETAMINOPHEN 10-325 MG PO TABS: 1.0000 | ORAL_TABLET | Freq: Four times a day (QID) | ORAL | 0 refills | Status: DC | PRN

## 2018-11-14 NOTE — Telephone Encounter (Signed)
Phone call from [facility] on [address]. They are unable to fill Hydrocodone-APAP 10-325. Will not be in stock until mid-February.  [facility].  Danley Danker, RN [facility] RN)

## 2018-11-14 NOTE — Patient Instructions (Signed)
Increase your metoprolol to 50 mg twice a day for hypertension  I like your idea of calorie counting to help you lose weight.

## 2018-11-15 MED ORDER — HYDROCODONE-ACETAMINOPHEN 10-325 MG PO TABS: 1.0000 | ORAL_TABLET | Freq: Three times a day (TID) | ORAL | 0 refills | Status: DC | PRN

## 2018-11-15 NOTE — Telephone Encounter (Signed)
Spoke with Stanton Kidney at pharmacy and script was filled at other pharmacy.  [name],CMA

## 2018-11-15 NOTE — Telephone Encounter (Signed)
Please call [facility] on [address] to cancel the hydrocodone-APAP 10-325 prescription.  I [facility] [address]

## 2018-11-18 ENCOUNTER — Encounter: Payer: Self-pay | Admitting: Family Medicine

## 2018-11-18 NOTE — Assessment & Plan Note (Signed)
Established problem Controlled Continue current therapy regiment.  

## 2018-11-18 NOTE — Assessment & Plan Note (Signed)
Established problem worsened.  Decline in BP control with gain in weight.  Increase metoprolol tartrate to 50 mg twice a day.

## 2018-11-18 NOTE — Assessment & Plan Note (Signed)
Established problem worsened.  Weight up 72 pounds since 2018.  Motivational interviewing: patient thinks she is going to return to calorie counting with reduction in amount of fast food consumed.   RTC 3 months.

## 2018-11-18 NOTE — Assessment & Plan Note (Signed)
Established problem Controlled Continue current therapy regiment. See chronic pain syndrome for details.

## 2018-11-18 NOTE — Assessment & Plan Note (Signed)
Established problem Controlled Continue current therapy regiment per Dr Reece Levy with Transcranial Magnetic Stimulation therapy.Marland Kitchen

## 2018-11-18 NOTE — Progress Notes (Signed)
Patient ID: [name], female   DOB: September 08, 1951, 68 y.o.   MRN: [id number]   Subjective:    Patient ID: [name], female    DOB: [birth year], 68 y.o.   MRN: [id number] [name] is alone Sources of clinical information for visit is/are patient and past medical records. Nursing assessment for this office visit was reviewed with the patient for accuracy and revision.  Previous Report(s) Reviewed: historical medical records     Office Visit from 08/15/2018 in [place]  PHQ-9 Total Score  5     GAD 7 : Generalized Anxiety Score 11/14/2018 08/15/2018 02/07/2018  Nervous, Anxious, on Edge 0 1 1  Control/stop worrying 0 0 0  Worry too much - different things 0 0 0  Trouble relaxing 0 0 1  Restless 0 0 0  Easily annoyed or irritable 0 1 1  Afraid - awful might happen 0 0 0  Total GAD 7 Score 0 2 3  Anxiety Difficulty Not difficult at all Very difficult Somewhat difficult       HPI  Problem List Items Addressed This Visit      High   Obesity, Class III, BMI 40-49.9 (morbid obesity) ([place]) (Chronic) - Longstanding issue for patient - Gained additional 7 pounds since ov in October 2019.  Increase eating fast foods and simple sweets - Gain of 72 pounds since 08/2017.   - No formal exercise. - Working part-time as Scientist, water quality - Not taking anorexiants    Bipolar 2 disorder ([place]) (Chronic) - longstanding - Followed by psychiatry (Dr Reece Levy) - Receiving Trancranial Magnetic Stimulation therapy - No changes in meds - PHQ9 = 9 about same as last ov.   Rates symptoms as making it very hard for her to do things.  - denies manic episodes.    RESTLESS LEGS SYNDROME (Chronic) - stable. Responsive to opiates MS Contin and HC/APAP 5 mg twice a day   CHRONIC HYPERTENSION  Disease Monitoring  Blood pressure range: measurements at home with SBP > 140 frequently.   Chest pain: no   Dyspnea: no   Claudication: no   Medication compliance: yes, metoprolol 25 mg  bid  Medication Side Effects  Lightheadedness: no   Urinary frequency: no   Edema: no    SH: Smoking stopped > 5 yrs ago, living with dgt.      Review of Systems    see HPI Objective:   Physical Exam  VS reviewed GEN: Alert, Cooperative, Groomed, NAD HEENT: PERRL; EAC bilaterally not occluded, TM's translucent with normal LM, (+) LR;                No cervical LAN, No thyromegaly, No palpable masses COR: RRR, No M/G/R, No JVD, Normal PMI size and location LUNGS: BCTA, No Acc mm use, speaking in full sentences EXT: No peripheral leg edema.   Neuro: Oriented to person, place, and time; g ait: Normal speed, No significant path deviation, Step through +,  Psych: Normal affect/thought/speech/language \   Assessment & Plan:  See problem list

## 2018-11-18 NOTE — Assessment & Plan Note (Signed)
Established problem Will continue to work on atherosclerotic risk factors.

## 2018-11-18 NOTE — Assessment & Plan Note (Addendum)
Established problem Intermittent exacerbation of left neck/trapezius/interscapular region.  Some improvement after PT.  Pt reprts practicing PT home exercise program.  No evidence of aberrant behavior on review [place] CSRS DB.  Rx sent for MS Contin 15 mg disp#60 to fill now, in 30 days and in 60 days Rx sent for hydrocodone/apap 5 - 325 mg tablet, dips#40 tab, fill now, and in 30 days.

## 2018-12-16 ENCOUNTER — Other Ambulatory Visit: Payer: Self-pay | Admitting: *Deleted

## 2018-12-16 ENCOUNTER — Other Ambulatory Visit: Payer: Self-pay | Admitting: Family Medicine

## 2018-12-16 DIAGNOSIS — G894 Chronic pain syndrome: Secondary | ICD-10-CM

## 2018-12-16 DIAGNOSIS — M48061 Spinal stenosis, lumbar region without neurogenic claudication: Secondary | ICD-10-CM

## 2018-12-16 DIAGNOSIS — G8929 Other chronic pain: Secondary | ICD-10-CM

## 2018-12-16 DIAGNOSIS — M47896 Other spondylosis, lumbar region: Secondary | ICD-10-CM

## 2018-12-16 MED ORDER — MORPHINE SULFATE ER 15 MG PO TBCR: 15.0000 mg | EXTENDED_RELEASE_TABLET | Freq: Two times a day (BID) | ORAL | 0 refills | Status: DC

## 2018-12-16 NOTE — Telephone Encounter (Signed)
Pt states that she typically gets a 3 month supply of her pain meds.  She will take her last Morphine today. Will forward to MD. Clinton Sawyer, Salome Spotted

## 2018-12-19 ENCOUNTER — Encounter: Payer: Self-pay | Admitting: Gastroenterology

## 2018-12-22 ENCOUNTER — Encounter: Payer: Self-pay | Admitting: Family Medicine

## 2018-12-23 ENCOUNTER — Other Ambulatory Visit: Payer: Self-pay | Admitting: Family Medicine

## 2018-12-23 ENCOUNTER — Other Ambulatory Visit: Payer: Self-pay

## 2018-12-23 DIAGNOSIS — F319 Bipolar disorder, unspecified: Secondary | ICD-10-CM | POA: Diagnosis not present

## 2018-12-23 DIAGNOSIS — I10 Essential (primary) hypertension: Secondary | ICD-10-CM

## 2018-12-23 DIAGNOSIS — K2100 Gastro-esophageal reflux disease with esophagitis, without bleeding: Secondary | ICD-10-CM

## 2018-12-23 DIAGNOSIS — F332 Major depressive disorder, recurrent severe without psychotic features: Secondary | ICD-10-CM | POA: Diagnosis not present

## 2018-12-23 DIAGNOSIS — F40248 Other situational type phobia: Secondary | ICD-10-CM | POA: Diagnosis not present

## 2018-12-23 DIAGNOSIS — Z1231 Encounter for screening mammogram for malignant neoplasm of breast: Secondary | ICD-10-CM

## 2018-12-23 DIAGNOSIS — F4000 Agoraphobia, unspecified: Secondary | ICD-10-CM | POA: Diagnosis not present

## 2018-12-23 MED ORDER — RAMIPRIL 10 MG PO CAPS: 10.0000 mg | ORAL_CAPSULE | Freq: Every day | ORAL | 3 refills | Status: DC

## 2018-12-23 MED ORDER — OMEPRAZOLE 40 MG PO CPDR: 40.0000 mg | DELAYED_RELEASE_CAPSULE | Freq: Every day | ORAL | 3 refills | Status: DC

## 2018-12-26 ENCOUNTER — Other Ambulatory Visit: Payer: Self-pay | Admitting: Family Medicine

## 2018-12-26 DIAGNOSIS — K2100 Gastro-esophageal reflux disease with esophagitis, without bleeding: Secondary | ICD-10-CM

## 2018-12-26 DIAGNOSIS — I10 Essential (primary) hypertension: Secondary | ICD-10-CM

## 2018-12-30 DIAGNOSIS — F332 Major depressive disorder, recurrent severe without psychotic features: Secondary | ICD-10-CM | POA: Diagnosis not present

## 2018-12-30 DIAGNOSIS — F319 Bipolar disorder, unspecified: Secondary | ICD-10-CM | POA: Diagnosis not present

## 2018-12-30 DIAGNOSIS — F4000 Agoraphobia, unspecified: Secondary | ICD-10-CM | POA: Diagnosis not present

## 2018-12-30 DIAGNOSIS — F40248 Other situational type phobia: Secondary | ICD-10-CM | POA: Diagnosis not present

## 2019-01-16 ENCOUNTER — Ambulatory Visit: Payer: Medicare Other

## 2019-01-20 ENCOUNTER — Ambulatory Visit (AMBULATORY_SURGERY_CENTER): Payer: Self-pay | Admitting: *Deleted

## 2019-01-20 ENCOUNTER — Other Ambulatory Visit: Payer: Self-pay

## 2019-01-20 VITALS — Temp 97.2°F | Ht 64.0 in | Wt 274.0 lb

## 2019-01-20 DIAGNOSIS — Z1211 Encounter for screening for malignant neoplasm of colon: Secondary | ICD-10-CM

## 2019-01-20 DIAGNOSIS — Z8371 Family history of colonic polyps: Secondary | ICD-10-CM

## 2019-01-20 NOTE — Progress Notes (Signed)
No egg or soy allergy known to patient  No issues with past sedation with any surgeries  or procedures, no intubation problems  No diet pills per patient No home 02 use per patient  No blood thinners per patient  Pt denies issues with constipation - pt uses Miralax daily due to pain meds- she does have a stool daily- soft and regular unless misses the Miralax  No A fib or A flutter  EMMI video sent to pt's e mail -  Pt has a SU prep prep at home from Previously scheduled colonoscopy

## 2019-01-28 ENCOUNTER — Ambulatory Visit: Payer: Medicare Other

## 2019-02-03 ENCOUNTER — Encounter: Payer: Medicare Other | Admitting: Gastroenterology

## 2019-02-05 ENCOUNTER — Encounter: Payer: Self-pay | Admitting: Family Medicine

## 2019-02-05 DIAGNOSIS — M48061 Spinal stenosis, lumbar region without neurogenic claudication: Secondary | ICD-10-CM

## 2019-02-05 DIAGNOSIS — G894 Chronic pain syndrome: Secondary | ICD-10-CM

## 2019-02-05 DIAGNOSIS — M47896 Other spondylosis, lumbar region: Secondary | ICD-10-CM

## 2019-02-06 MED ORDER — HYDROCODONE-ACETAMINOPHEN 10-325 MG PO TABS: 1.0000 | ORAL_TABLET | Freq: Three times a day (TID) | ORAL | 0 refills | Status: DC | PRN

## 2019-02-13 ENCOUNTER — Ambulatory Visit: Payer: Medicare Other | Admitting: Family Medicine

## 2019-02-28 ENCOUNTER — Encounter: Payer: Self-pay | Admitting: Family Medicine

## 2019-03-04 ENCOUNTER — Other Ambulatory Visit: Payer: Self-pay | Admitting: Family Medicine

## 2019-03-04 DIAGNOSIS — E876 Hypokalemia: Secondary | ICD-10-CM

## 2019-03-04 MED ORDER — POTASSIUM CHLORIDE CRYS ER 10 MEQ PO TBCR: 40.0000 meq | EXTENDED_RELEASE_TABLET | Freq: Every day | ORAL | 3 refills | Status: DC

## 2019-03-10 ENCOUNTER — Other Ambulatory Visit: Payer: Self-pay | Admitting: Family Medicine

## 2019-03-10 ENCOUNTER — Encounter: Payer: Self-pay | Admitting: Family Medicine

## 2019-03-10 DIAGNOSIS — G894 Chronic pain syndrome: Secondary | ICD-10-CM

## 2019-03-10 DIAGNOSIS — M47896 Other spondylosis, lumbar region: Secondary | ICD-10-CM

## 2019-03-10 DIAGNOSIS — G2581 Restless legs syndrome: Secondary | ICD-10-CM

## 2019-03-10 DIAGNOSIS — M48061 Spinal stenosis, lumbar region without neurogenic claudication: Secondary | ICD-10-CM

## 2019-03-10 MED ORDER — MORPHINE SULFATE ER 15 MG PO TBCR: 15.0000 mg | EXTENDED_RELEASE_TABLET | Freq: Two times a day (BID) | ORAL | 0 refills | Status: DC

## 2019-03-10 MED ORDER — HYDROCODONE-ACETAMINOPHEN 10-325 MG PO TABS: 1.0000 | ORAL_TABLET | Freq: Three times a day (TID) | ORAL | 0 refills | Status: DC | PRN

## 2019-04-30 DIAGNOSIS — F3181 Bipolar II disorder: Secondary | ICD-10-CM | POA: Diagnosis not present

## 2019-04-30 DIAGNOSIS — F4312 Post-traumatic stress disorder, chronic: Secondary | ICD-10-CM | POA: Diagnosis not present

## 2019-05-07 ENCOUNTER — Encounter: Payer: Self-pay | Admitting: Gastroenterology

## 2019-05-08 ENCOUNTER — Encounter: Payer: Self-pay | Admitting: Gastroenterology

## 2019-05-14 ENCOUNTER — Other Ambulatory Visit: Payer: Self-pay | Admitting: Family Medicine

## 2019-05-14 DIAGNOSIS — K929 Disease of digestive system, unspecified: Secondary | ICD-10-CM

## 2019-05-17 ENCOUNTER — Other Ambulatory Visit: Payer: Self-pay

## 2019-05-17 ENCOUNTER — Ambulatory Visit (HOSPITAL_COMMUNITY)
Admission: EM | Admit: 2019-05-17 | Discharge: 2019-05-17 | Disposition: A | Discharge status: Home / Self Care | Payer: Medicare Other | Attending: Internal Medicine | Admitting: Internal Medicine

## 2019-05-17 DIAGNOSIS — R42 Dizziness and giddiness: Secondary | ICD-10-CM | POA: Diagnosis not present

## 2019-05-17 LAB — CBC
HCT: 43.8 % (ref 36.0–46.0)
Hemoglobin: 14.7 g/dL (ref 12.0–15.0)
MCH: 30.1 pg (ref 26.0–34.0)
MCHC: 33.6 g/dL (ref 30.0–36.0)
MCV: 89.8 fL (ref 80.0–100.0)
Platelets: 296 10*3/uL (ref 150–400)
RBC: 4.88 MIL/uL (ref 3.87–5.11)
RDW: 12.9 % (ref 11.5–15.5)
WBC: 9.2 10*3/uL (ref 4.0–10.5)
nRBC: 0 % (ref 0.0–0.2)

## 2019-05-17 LAB — BASIC METABOLIC PANEL
Anion gap: 12 (ref 5–15)
BUN: 12 mg/dL (ref 8–23)
CO2: 25 mmol/L (ref 22–32)
Calcium: 10.3 mg/dL (ref 8.9–10.3)
Chloride: 99 mmol/L (ref 98–111)
Creatinine, Ser: 0.88 mg/dL (ref 0.44–1.00)
GFR calc Af Amer: >60 mL/min (ref >60)
GFR calc non Af Amer: >60 mL/min (ref >60)
Glucose, Bld: 141 mg/dL — ABNORMAL HIGH (ref 70–99)
Potassium: 4.6 mmol/L (ref 3.5–5.1)
Sodium: 136 mmol/L (ref 135–145)

## 2019-05-17 NOTE — ED Provider Notes (Signed)
[place]    CSN: [id number] Arrival date & time: 05/17/19  1244     History   Chief Complaint Chief Complaint  Patient presents with  . Dizziness    HPI [name] is a 68 y.o. female with a history of COPD as controlled, bipolar disorder-controlled comes to urgent care with complaints of dizziness this morning.  Patient's event happened after she woke up out of bed.  She was going to take care oral medications when she experienced lightheadedness and felt faint.  Patient denies passing out.  She sat back on the bed and subsequently made her way to the bathroom where he had a bowel movement as well as some nausea with no vomiting.  Patient denies any fever or chills.  No aspirin respiratory infection symptoms.  No body aches.  No head injuries.  No known precipitating factors.  No difficulty breathing, wheezing, fever or chills.  HPI  Past Medical History:  Diagnosis Date  . Adrenal nodule ([place]) 04/08/2011   04/08/11 Abdominal CT showed Left adrenal nodule which is tachnically indeterminate but most likely an adenoma.  It is 1.7 cm and 42 HU on portal venous phase image. 31 HU on delayed images.   01/09/2012 Abdominal CT w/ & w/o CM: Stable benign left adrenal adenoma. No further specific follow-up is needed for this finding.    . Allergic rhinitis 01/03/2007   Qualifier: Diagnosis of  By: Drucie Ip    . Allergy   . Anemia    hx of  in 20's  . Anxiety   . Bipolar disorder ([name])   . Carpal tunnel syndrome 02/14/2007   S/P carpel tunnel release bilaterally.    . Cataracts, bilateral    removed both eyes  2017-2018  . Chronic pain syndrome 12/24/2008  . 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 COPD WITHOUT EXACERBATION 01/03/2007   Qualifier: Diagnosis of  By: Drucie Ip  emphysema  . DISC WITH RADICULOPATHY 01/03/2007   Qualifier: Diagnosis of  By: Drucie Ip    . Emphysema of lung ([place])   . Esophageal  reflux 01/03/2007   Centricity Description: GASTROESOPHAGEAL REFLUX, NO ESOPHAGITIS Qualifier: Diagnosis of  By: Drucie Ip   Centricity Description: GERD Qualifier: Diagnosis of  By: Tye Savoy MD, Tommi Rumps    . External hemorrhoids s/p hemorrhoidectomy 03/28/2018 03/28/2018  . Fibromyalgia   . Functional gastrointestinal disorder 06/18/2008   Functional Gastrointestinal Disorder with frequent eructation and bloating sensation with acute flares.  Responsive to Compazine and Bentyl.  Takes a few days to calm down.    Marland Kitchen Heel spur, fracture, left 04/17/2018  . HEMORRHOIDS, NOS 01/03/2007   Qualifier: Diagnosis of  By: Drucie Ip    . Hepatic steatosis 04/12/2011  . Herpes simplex labialis 05/11/2011  . Hip osteoarthritis 05/23/2007   MRI Pelvis (03/13/07) Bilateral osteoarthritis of the hips, left greater than right.  The  patient has a slightly more prominent hip effusion on the left than  the right.  No other significant abnormality.    . History of MRSA infection 04/28/2011  . HYPERCHOLESTEROLEMIA 12/24/2008  . Hypertension   . HYPERTENSION, BENIGN SYSTEMIC 01/03/2007   Qualifier: Diagnosis of  By: Drucie Ip    . HYPERTRIGLYCERIDEMIA 11/11/2007   Qualifier: Diagnosis of  By: McDiarmid MD, Sherren Mocha    . Hypokalemia 12/12/2017  . Hypothyroidism 02/15/2007   Qualifier: Diagnosis of  By: McDiarmid MD, Sherren Mocha    . HYPOTHYROIDISM, BORDERLINE 02/15/2007  . Irritable  bowel syndrome 01/03/2007   Qualifier: Diagnosis of  By: Drucie Ip    . LACTOSE INTOLERANCE 03/10/2008  . Major depressive disorder, recurrent episode ([place]) 01/03/2007   Qualifier: Diagnosis of  By: Drucie Ip    . Obesity (BMI 30.0-34.9) 09/30/2007   Has lost 55 lbs since easter. Goal of <190 by 08/05/12.     . Obesity, Class III, BMI 40-49.9 (morbid obesity) ([place]) 09/30/2007       . Osteoarthritis of spine 01/03/2007   MR I Lumbar (08/03): GeneralDDD; L2-3 Large  HNP.  Mod pos  hnp - 12/14/2005, smhnpw/irritL4root - 12/14/2005    .  OSTEOARTHRITIS, HANDS, BILATERAL 06/18/2008   Qualifier: Diagnosis of  By: McDiarmid MD, Sherren Mocha    . PATELLO FEMORAL STRESS SYNDROME 01/03/2007   Qualifier: Diagnosis of  By: Drucie Ip    . Periodic limb movements of sleep 12/05/2010   Diagnosed on Polysomnography testing Fall 2011.     Marland Kitchen Pneumonia    walking pneumonia in the 90's  . Pre-diabetes 12/25/2008   Qualifier: Diagnosis of  By: McDiarmid MD, Sherren Mocha    . Prolapsed internal hemorrhoids, grade 3, s/p ligation/pexy/hemorrhoidectomy 03/28/2018 03/28/2018  . RESTLESS LEGS SYNDROME 09/11/2007   Polysomnography (10/2010, Dr Keturah Barre, interpreter. ) showed periodic limb movements that frequently awoke patient from sleep with arousal index of 4 per hour. Dr Danton Sewer (Sleep Specialist) thought her RLS was the major contributor to patient poor sleep condition, more than any sleep apnea.  He recommended considering opamine agonist (either requip or mirapex) to see if this will help.     . Rosacea, acne 10/24/2011  . SIGMOID POLYP 01/23/2008  . Solitary lung nodule 04/08/2011  . Stress incontinence   . Synovial cyst of lumbar facet joint 02/17/2016  . Urticaria, chronic 05/22/2011  . VITAMIN D DEFICIENCY 03/03/2010    Patient Active Problem List   Diagnosis Date Noted  . Trigger thumb of left hand 08/16/2018  . Work-related condition 05/17/2018  . Coronary artery calcification seen on CAT scan 12/03/2017  . Medication management 12/14/2016  . Spinal stenosis of lumbar region 06/21/2016  . Atherosclerosis of abdominal aorta ([place]) 05/12/2016  . Encounter for chronic pain management 12/03/2014  . Metabolic syndrome 15/72/6203  . Periodic limb movements of sleep 12/05/2010  . VITAMIN D DEFICIENCY 03/03/2010  . Pre-diabetes 12/25/2008  . HYPERCHOLESTEROLEMIA 12/24/2008  . Chronic pain syndrome 12/24/2008  . Functional gastrointestinal disorder 06/18/2008  . SIGMOID POLYP 01/23/2008  . HYPERTRIGLYCERIDEMIA 11/11/2007  . Obesity, Class III,  BMI 40-49.9 (morbid obesity) ([place]) 09/30/2007  . RESTLESS LEGS SYNDROME 09/11/2007  . Fibromyalgia syndrome 07/15/2007  . Hip osteoarthritis 05/23/2007  . Hypothyroidism 02/15/2007  . Bipolar 2 disorder ([place]) 01/03/2007  . HYPERTENSION, BENIGN SYSTEMIC 01/03/2007  . Allergic rhinitis 01/03/2007  . Stage 1 mild COPD by GOLD classification ([place]) 01/03/2007  . Esophageal reflux 01/03/2007  . Irritable bowel syndrome 01/03/2007  . Osteoarthritis of spine 01/03/2007    Past Surgical History:  Procedure Laterality Date  . APPENDECTOMY    . bladder tack  1998   Dr Jeffie Pollock (urology)  . BUNIONECTOMY  1992   bilateral feet with pins in great toes  . CARDIAC CATHETERIZATION    . CARPAL TUNNEL RELEASE  2010   Bilateral, Dr Theodis Sato  . COLONOSCOPY  2004   Dr Verdia Kuba (GI)  . CYSTOCELE REPAIR  1998    Dr Gertie Fey (GYN)-  . ESOPHAGOGASTRODUODENOSCOPY    . EVALUATION UNDER ANESTHESIA WITH HEMORRHOIDECTOMY N/A 03/28/2018  Procedure: ANORECTAL EXAM UNDER ANESTHESIA lateral internal partial sphincterotomy, hemorrhoidal ligation pixie, HEMORRHOIDECTOMY;  Surgeon: Michael Boston, MD;  Location: [facility];  Service: General;  Laterality: N/A;  . EXAM ANORECTAL W/ ULTRASOUND     Dr. Johney Maine 03-28-18   . EXPLORATORY LAPAROTOMY    . FACIAL LACERATIONS REPAIR     Facial Laceration repair as adolescent   . INJECTION HIP INTRA ARTICULAR  2008   Left Hip fluoroscopically-guided corticosteorid injection for painful osteoarthritis with good effect (06/2007)  . LAPAROSCOPY FOR ECTOPIC PREGNANCY    . LUMBAR LAMINECTOMY/DECOMPRESSION MICRODISCECTOMY N/A 06/21/2016   Procedure: MICRO LUMBAR DECOMPRESSION L4-L5 AND REMOVAL OF SYNOVIAL CYST    1 LEVEL;  Surgeon: Susa Day, MD;  Location: [facility];  Service: Orthopedics;  Laterality: N/A;  . NM MYOVIEW LTD  2011   Dr Daneen Schick, III (Card)  . RECTOCELE REPAIR  1998   Dr Gertie Fey (GYN)  . SPHINCTEROTOMY N/A 03/28/2018   Procedure: ANAL SPHINCTEROTOMY;  Surgeon:  Michael Boston, MD;  Location: [facility];  Service: General;  Laterality: N/A;  . UPPER GASTROINTESTINAL ENDOSCOPY      OB History   No obstetric history on file.      Home Medications    Prior to Admission medications   Medication Sig Start Date End Date Taking? Authorizing Provider  aspirin EC 81 MG tablet Take 1 tablet (81 mg total) by mouth at bedtime. May resume 4 days post-op Patient taking differently: Take 81 mg by mouth at bedtime. May resume 4 days post-op 06/22/16   Cecilie Kicks, PA-C  Calcium Carb-Cholecalciferol (CALCIUM 1000 + D) 1000-800 MG-UNIT TABS Take by mouth.    [provider]  Carboxymethylcellulose Sodium (THERATEARS OP) Place 2 drops into both eyes 4 (four) times daily as needed (dry eyes).    [provider]  citalopram (CELEXA) 40 MG tablet Take 0.5 tablets (20 mg total) by mouth at bedtime. Patient taking differently: Take 40 mg by mouth daily.  06/22/16   Cecilie Kicks, PA-C  dicyclomine (BENTYL) 20 MG tablet TAKE 1/2 TO 1 TABLET 4     TIMES A DAY BEFORE MEALS   AND AT BEDTIME 05/15/19   McDiarmid, Blane Ohara, MD  fexofenadine (ALLEGRA) 180 MG tablet Take 180 mg by mouth daily.    [provider]  fluticasone (FLONASE) 50 MCG/ACT nasal spray Place 2 sprays into both nostrils daily.    [provider]  hydrochlorothiazide (HYDRODIURIL) 25 MG tablet TAKE 1 TABLET DAILY 10/07/18   McDiarmid, Blane Ohara, MD  HYDROcodone-acetaminophen (NORCO) 10-325 MG tablet Take 1 tablet by mouth every 8 (eight) hours as needed for up to 40 doses for moderate pain. 03/10/19   McDiarmid, Blane Ohara, MD  HYDROcodone-acetaminophen (NORCO) 10-325 MG tablet Take 1 tablet by mouth every 8 (eight) hours as needed for up to 40 doses for moderate pain. 03/10/19   McDiarmid, Blane Ohara, MD  hydrOXYzine (VISTARIL) 50 MG capsule Take 150 mg by mouth at bedtime.  09/02/15   [provider]  levothyroxine (SYNTHROID, LEVOTHROID) 50 MCG tablet TAKE 3 TABLETS BY MOUTH  EVERY DAY BEFORE BREAKFAST Patient taking differently: TAKE 2 1/2  TABLETS BY MOUTH EVERY DAY BEFORE BREAKFAST 10/09/18   McDiarmid, Blane Ohara, MD  LORazepam (ATIVAN) 1 MG tablet Take 1 mg by mouth 3 (three) times daily.     [provider]  metoprolol tartrate (LOPRESSOR) 50 MG tablet Take 1 tablet (50 mg total) by mouth 2 (two) times daily. 11/14/18  McDiarmid, Blane Ohara, MD  Multiple Minerals-Vitamins (CALCIUM-MAGNESIUM-ZINC-D3) TABS Take 3 tablets by mouth daily.    [provider]  Multiple Vitamin (MULTIVITAMIN WITH MINERALS) TABS tablet Take 1 tablet by mouth daily.    [provider]  nitroGLYCERIN (NITROSTAT) 0.4 MG SL tablet Place 1 tablet (0.4 mg total) under the tongue every 5 (five) minutes as needed for chest pain. 08/15/18   McDiarmid, Blane Ohara, MD  omeprazole (PRILOSEC) 40 MG capsule TAKE 1 CAPSULE DAILY 12/27/18   McDiarmid, Blane Ohara, MD  polyethylene glycol (MIRALAX) powder Take 17 g by mouth daily.     [provider]  potassium chloride (KLOR-CON M10) 10 MEQ tablet Take 4 tablets (40 mEq total) by mouth at bedtime. 03/04/19   McDiarmid, Blane Ohara, MD  prazosin (MINIPRESS) 1 MG capsule Take 1 capsule (1 mg total) by mouth at bedtime. 05/28/14   McDiarmid, Blane Ohara, MD  ramipril (ALTACE) 10 MG capsule TAKE 1 CAPSULE DAILY 12/27/18   McDiarmid, Blane Ohara, MD  Simethicone (GAS-X PO) Take 2 tablets by mouth daily as needed (gas).    [provider]  tiZANidine (ZANAFLEX) 2 MG tablet  10/27/18   [provider]  traZODone (DESYREL) 100 MG tablet Take 300 mg by mouth at bedtime.  09/03/15   [provider]  vitamin B-12 (CYANOCOBALAMIN) 1000 MCG tablet Take 1,000 mcg by mouth daily.    [provider]  vitamin C (ASCORBIC ACID) 500 MG tablet Take 500 mg by mouth daily.    [provider]    Family History Family History  Problem Relation Age of Onset  . Diabetes Father   . Heart disease Father   . Diabetes type II Other    . Hypertension Other   . Heart attack Other   . Alcohol abuse Other   . Depression Other   . Asthma Other   . Migraines Other   . Hypertension Mother   . Hypertension Sister   . Obesity Sister   . Kidney disease Brother   . Hypertension Daughter   . Hypertension Son   . Heart disease Brother   . Hypertension Brother   . Bipolar disorder Daughter   . Hypertension Daughter   . Hypertension Son   . Colon polyps Son   . Esophageal cancer Neg Hx   . Stomach cancer Neg Hx   . Rectal cancer Neg Hx   . Colon cancer Neg Hx     Social History Social History   Tobacco Use  . Smoking status: Former Smoker    Packs/day: 1.50    Years: 44.00    Pack years: 66.00    Types: Cigarettes    Quit date: 04/06/2008    Years since quitting: 11.1  . Smokeless tobacco: Never Used  Substance Use Topics  . Alcohol use: No    Frequency: Never  . Drug use: No    Comment: hx of marijuana use - last used 2 months ago      Allergies   Diclofenac-misoprostol, Aripiprazole, Emsam [selegiline], Estradiol, Ibuprofen, Naproxen, Neurontin [gabapentin], Paroxetine, Prednisone, and Tape   Review of Systems Review of Systems  Constitutional: Positive for activity change. Negative for fatigue and fever.  HENT: Negative.   Respiratory: Negative.   Cardiovascular: Negative.  Negative for palpitations.  Gastrointestinal: Positive for diarrhea, nausea and vomiting. Negative for constipation.  Endocrine: Negative.   Musculoskeletal: Negative.   Skin: Negative.   Neurological: Positive for dizziness, weakness and light-headedness. Negative for syncope.  Psychiatric/Behavioral: Negative for confusion and decreased concentration.     Physical Exam Triage Vital Signs ED Triage Vitals  Enc Vitals Group     BP 05/17/19 1358 (!) 141/91     Pulse Rate 05/17/19 1358 68     Resp 05/17/19 1358 18     Temp 05/17/19 1358 98.9 F (37.2 C)     Temp Source 05/17/19 1358 Oral     SpO2 05/17/19 1358 97 %      Weight --      Height --      Head Circumference --      Peak Flow --      Pain Score 05/17/19 1404 3     Pain Loc --      Pain Edu? --      Excl. in [place]? --    No data found.  Updated Vital Signs BP (!) 141/91 (BP Location: Right Arm)   Pulse 68   Temp 98.9 F (37.2 C) (Oral)   Resp 18   SpO2 97%   Visual Acuity Right Eye Distance:   Left Eye Distance:   Bilateral Distance:    Right Eye Near:   Left Eye Near:    Bilateral Near:     Physical Exam Constitutional:      General: She is not in acute distress.    Appearance: Normal appearance. She is obese. She is not ill-appearing or toxic-appearing.  HENT:     Nose: Nose normal.     Mouth/Throat:     Mouth: Mucous membranes are moist.     Pharynx: Oropharynx is clear. No oropharyngeal exudate or posterior oropharyngeal erythema.  Eyes:     Extraocular Movements: Extraocular movements intact.     Conjunctiva/sclera: Conjunctivae normal.  Cardiovascular:     Rate and Rhythm: Normal rate and regular rhythm.     Pulses: Normal pulses.  Pulmonary:     Effort: Pulmonary effort is normal. No respiratory distress.     Breath sounds: Normal breath sounds. No stridor. No rhonchi or rales.  Abdominal:     General: Bowel sounds are normal.  Musculoskeletal: Normal range of motion.        General: No swelling or tenderness.  Skin:    Capillary Refill: Capillary refill takes less than 2 seconds.  Neurological:     General: No focal deficit present.     Mental Status: She is alert and oriented to person, place, and time.     Cranial Nerves: No cranial nerve deficit.     Sensory: No sensory deficit.     Motor: No weakness.     Coordination: Coordination normal.      UC Treatments / Results  Labs (all labs ordered are listed, but only abnormal results are displayed) Labs Reviewed  BASIC METABOLIC PANEL - Abnormal; Notable for the following components:      Result Value   Glucose, Bld 141 (*)    All other components  within normal limits  CBC    EKG   Radiology No results found.  Procedures Procedures (including critical care time)  Medications Ordered in UC Medications - No data to display  Initial Impression / Assessment and Plan / UC Course  I have reviewed the triage vital signs and the nursing notes.  Pertinent labs & imaging results that were available during my care of the patient were reviewed by me and considered in my medical decision making (see chart for details).     1.  Orthostatic  dizziness: Patient is encouraged to increase oral fluid intake Education on orthostatic precautions as seen rising from laying or sitting position in a staged way. No chest pain or chest pressure.  No numbness or tingling.  If patient's symptoms recur or is persistent, she needs to return to urgent care for further management. Final Clinical Impressions(s) / UC Diagnoses   Final diagnoses:  Orthostatic dizziness   Discharge Instructions   None    ED Prescriptions    None     Controlled Substance Prescriptions [place] Controlled Substance Registry consulted? No   Chase Picket, MD 05/19/19 1407

## 2019-05-17 NOTE — ED Triage Notes (Signed)
Per pt she woke up this morning and stood up and felt very dizzy and off balance. Pt did fall but caught herself. Still feeling dizzy. No tingling in hands or face. No slurred speech, no numbness. Slight headache. Some nausea. There is some right ear pressure.

## 2019-05-20 ENCOUNTER — Encounter: Payer: Self-pay | Admitting: Family Medicine

## 2019-05-20 ENCOUNTER — Telehealth: Payer: Self-pay | Admitting: *Deleted

## 2019-05-20 ENCOUNTER — Telehealth (HOSPITAL_COMMUNITY): Payer: Self-pay | Admitting: Emergency Medicine

## 2019-05-20 NOTE — Telephone Encounter (Signed)
No significant abnormalities. Pt contacted and made aware. Has appt with PCP tomorrow.

## 2019-05-20 NOTE — Telephone Encounter (Addendum)
Pt states that she is still dizzy especially with changing positions.  She is now dizzy again this AM.    She still denies CP, Abnormal SOB, fever or numbness and tingling.  Also states that she has not been drinking water as she was instructed and spends most of her time in bed.  Appt made for tomorrow (pt states she cant come today).  Advised to call back if she starts to experience any symptoms above. Christen Bame, CMA

## 2019-05-21 ENCOUNTER — Encounter: Payer: Self-pay | Admitting: Family Medicine

## 2019-05-21 ENCOUNTER — Ambulatory Visit (INDEPENDENT_AMBULATORY_CARE_PROVIDER_SITE_OTHER): Payer: Medicare Other | Admitting: Family Medicine

## 2019-05-21 ENCOUNTER — Other Ambulatory Visit: Payer: Self-pay

## 2019-05-21 VITALS — BP 132/80 | HR 66 | Wt 277.0 lb

## 2019-05-21 DIAGNOSIS — R51 Headache: Secondary | ICD-10-CM | POA: Diagnosis not present

## 2019-05-21 DIAGNOSIS — E119 Type 2 diabetes mellitus without complications: Secondary | ICD-10-CM

## 2019-05-21 DIAGNOSIS — R7309 Other abnormal glucose: Secondary | ICD-10-CM

## 2019-05-21 DIAGNOSIS — R42 Dizziness and giddiness: Secondary | ICD-10-CM

## 2019-05-21 DIAGNOSIS — R519 Headache, unspecified: Secondary | ICD-10-CM

## 2019-05-21 LAB — POCT GLYCOSYLATED HEMOGLOBIN (HGB A1C): HbA1c, POC (controlled diabetic range): 6.5 % (ref 0.0–7.0)

## 2019-05-21 LAB — GLUCOSE, POCT (MANUAL RESULT ENTRY): POC Glucose: 231 mg/dl — AB (ref 70–99)

## 2019-05-21 NOTE — Assessment & Plan Note (Addendum)
Patient was seen in the Emergency room 7/11and encouraged to increase fluid intake.  - Since discharge patient reports increasing fluid with little improvement initially but a lot of improvement yesterday  - Benign neurologic exam  - Informed patient to continue drinking and taking her time from going from sitting to standing - if not improvement in a few day to call for reevaluation

## 2019-05-21 NOTE — Patient Instructions (Signed)
Thank you for coming to see [place] today! With regards to your dizziness I want you to continue drinking plenty of fluids and taking your time standing up. If the dizziness worsens, you notice increased difficulty walking, seeing or hearing things I want you to go to the hospital to be evaluated.   Regarding your headaches please continue to take your nasal spray, allergy medication, and tylenol when needed. I would also like you to begin taking your blood pressures when having the headaches and recording them.   Also, your HA1C today was 6.5 which was up from your previous of 6.0. This means you are now diabetic. I would like you to work on dietary changes including low sodium and low sugar. I have attached our dietary recommendations for diabetics.   Please feel free to call and schedule an appointment if you have any questions or concerns.   Diet Recommendations for Diabetes  Carbohydrate includes starch, sugar, and fiber.  Of these, only sugar and starch raise blood glucose.  (Fiber is found in fruits, vegetables [especially skin, seeds, and stalks] and whole grains.)   Starchy (carb) foods: Bread, rice, pasta, potatoes, corn, cereal, grits, crackers, bagels, muffins, all baked goods.  (Fruit, milk, and yogurt also have carbohydrate, but most of these foods will not spike your blood sugar as most starchy foods will.)  A few fruits do cause high blood sugars; use small portions of bananas (limit to 1/2 at a time), grapes, watermelon, oranges, and most tropical fruits.   Protein foods: Meat, fish, poultry, eggs, dairy foods, and beans such as pinto and kidney beans (beans also provide carbohydrate).   1. Eat at least REAL 3 meals and 1-2 snacks per day. Never go more than 4-5 hours while awake without eating. Eat breakfast within the first hour of getting up.   2. Limit starchy foods to TWO per meal and ONE per snack. ONE portion of a starchy food is equal to the following:   - ONE slice of bread (or  its equivalent, such as half of a hamburger bun).   - 1/2 cup of a "scoopable" starchy food such as potatoes or rice.   - 15 grams of Total Carbohydrate as shown on food label.   - Every 4 ounces of a sweet drink (including fruit juice). 3. Include at every meal: a protein food, a carb food, and vegetables and/or fruit.   - Obtain twice the volume of veg's as protein or carbohydrate foods for both lunch and dinner.   - Fresh or frozen veg's are best.   - Keep frozen veg's on hand for a quick vegetable serving.

## 2019-05-21 NOTE — Progress Notes (Signed)
Subjective:  [name] is a 68 y.o. female who presents to the [facility] today with a chief complaint of dizziness upon standing.   HPI: [name] is a 68 y.o. female who presents today with complaints of dizziness. Patient was seen in the emergency room 7/11 for this issue. Patient reports that since her discharge she has worked to drink more fluids. She is also taking her time standing up. Patient reports that she initially saw little improvement but really noticed feeling better and not getting as dizzy upon standing yesterday. She reports difficulty walking because of the dizziness and a chronic back issues. She uses a can intermittently but can ambulate without.   She also complains of headaches over the past 3 days which she notices when she wakes up in the morning. She takes a tylenol, antihistamine, and nasal spray and she says the the headache resolves in "about 30 minutes". Patient denies headache at this time. She says that when she does have the headache it starts in the front left and travels to the back. She says that it feels like pressure in her sinuses. Acknowledges runny nose and congestion.   Denies unusual SOB, chest pain, changes in vision, changes in hearing, vomiting, swelling, numbness or tingling in extremities.   Objective:  Physical Exam: BP 132/80   Pulse 66   Wt 277 lb (125.6 kg)   SpO2 94%   BMI 47.55 kg/m   Gen: NAD, sitting comfortably in chair  HEENT: No nystagmus noted.  CV: RRR with no murmurs appreciated Pulm: NWOB, CTAB with no crackles, wheezes, or rhonchi GI: Normal bowel sounds present. Soft, Nontender, Nondistended. MSK: no edema, cyanosis, or clubbing noted Skin: warm, dry Neuro: grossly normal, moves all extremities, normal gate although patient admits it is a little slower than normal  Psych: Normal affect and thought content  Results for orders placed or performed in visit on 05/21/19 (from the past 72 hour(s))  HgB A1c     Status:  None   Collection Time: 05/21/19 11:16 AM  Result Value Ref Range   Hemoglobin A1C     HbA1c POC (<> result, manual entry)     HbA1c, POC (prediabetic range)     HbA1c, POC (controlled diabetic range) 6.5 0.0 - 7.0 %  Glucose (CBG)     Status: Abnormal   Collection Time: 05/21/19 11:16 AM  Result Value Ref Range   POC Glucose 231 (A) 70 - 99 mg/dl     Assessment/Plan:  Orthostatic dizziness Patient was seen in the Emergency room 7/11and encouraged to increase fluid intake.  - Since discharge patient reports increasing fluid with little improvement initially but a lot of improvement yesterday  - Benign neurologic exam  - Informed patient to continue drinking and taking her time from going from sitting to standing - if not improvement in a few day to call for reevaluation    Type 2 diabetes mellitus without complications ([place]) New diagnosis  - has been prediabetic for some time now - Gave dietary guidance to patient but did not initiate pharmaceutical management at this time  - Scheduled for diabetes visit with Dr. McDiarmid   Sinus headache Patient reports sinus pressure and congestion  - headaches for the past 3 mornings  - Patient takes tylenol, an antihistamine, and nasal spray and the headache resolves within 30 minutes  - continue to self medicate as needed - Informed patient to please call if the headaches continue  Lab Orders     HgB A1c     Glucose (CBG)  No orders of the defined types were placed in this encounter.   Gifford Shave, MD  PGY-1, [facility]  05/22/19 8:35 AM

## 2019-05-22 DIAGNOSIS — E119 Type 2 diabetes mellitus without complications: Secondary | ICD-10-CM | POA: Insufficient documentation

## 2019-05-22 NOTE — Assessment & Plan Note (Addendum)
Patient reports sinus pressure and congestion  - headaches for the past 3 mornings  - Patient takes tylenol, an antihistamine, and nasal spray and the headache resolves within 30 minutes  - continue to self medicate as needed - Informed patient to please call if the headaches continue

## 2019-05-22 NOTE — Assessment & Plan Note (Signed)
New diagnosis  - has been prediabetic for some time now - Gave dietary guidance to patient but did not initiate pharmaceutical management at this time  - Scheduled for diabetes visit with Dr. Wendy Poet

## 2019-05-27 ENCOUNTER — Ambulatory Visit (AMBULATORY_SURGERY_CENTER): Payer: Self-pay | Admitting: *Deleted

## 2019-05-27 ENCOUNTER — Other Ambulatory Visit: Payer: Self-pay

## 2019-05-27 VITALS — Ht 64.0 in | Wt 277.0 lb

## 2019-05-27 DIAGNOSIS — Z1210 Encounter for screening for malignant neoplasm of intestinal tract, unspecified: Secondary | ICD-10-CM

## 2019-05-27 NOTE — Progress Notes (Signed)
No egg or soy allergy known to patient  issues with past sedation with any surgeries or procedures- woke with last colon 2009 [facility]- no intubation problems  No diet pills per patient No home 02 use per patient  No blood thinners per patient  Pt denies issues with constipation - alternates with diarrhea- Fairly soft stools daily due to Miralax  No A fib or A flutter  EMMI video sent to pt's e mail  Pt has a SUprep Kit at home   Pt verified name, DOB, address and insurance during PV today. Pt mailed instruction packet to included paper to complete and mail back to [facility] with addressed and stamped envelope, Emmi video, copy of consent form to read and not return, and instructions. PV completed over the phone. Pt encouraged to call with questions or issues   Pt is aware that care partner will wait in the car during procedure; if they feel like they will be too hot to wait in the car; they may wait in the lobby.  We want them to wear a mask (we do not have any that we can provide them), practice social distancing, and we will check their temperatures when they get here.  I did remind patient that their care partner needs to stay in the parking lot the entire time. Pt will wear mask into building.

## 2019-06-01 ENCOUNTER — Encounter: Payer: Self-pay | Admitting: Family Medicine

## 2019-06-08 ENCOUNTER — Encounter: Payer: Self-pay | Admitting: Family Medicine

## 2019-06-09 ENCOUNTER — Other Ambulatory Visit: Payer: Self-pay | Admitting: Family Medicine

## 2019-06-09 ENCOUNTER — Telehealth: Payer: Self-pay | Admitting: Gastroenterology

## 2019-06-09 DIAGNOSIS — M47896 Other spondylosis, lumbar region: Secondary | ICD-10-CM

## 2019-06-09 DIAGNOSIS — M48061 Spinal stenosis, lumbar region without neurogenic claudication: Secondary | ICD-10-CM

## 2019-06-09 DIAGNOSIS — G4761 Periodic limb movement disorder: Secondary | ICD-10-CM

## 2019-06-09 DIAGNOSIS — M160 Bilateral primary osteoarthritis of hip: Secondary | ICD-10-CM

## 2019-06-09 DIAGNOSIS — G894 Chronic pain syndrome: Secondary | ICD-10-CM

## 2019-06-09 DIAGNOSIS — G2581 Restless legs syndrome: Secondary | ICD-10-CM

## 2019-06-09 MED ORDER — HYDROCODONE-ACETAMINOPHEN 10-325 MG PO TABS: 1.0000 | ORAL_TABLET | Freq: Three times a day (TID) | ORAL | 0 refills | Status: DC | PRN

## 2019-06-09 MED ORDER — MORPHINE SULFATE ER 15 MG PO TBCR: EXTENDED_RELEASE_TABLET | ORAL | 0 refills | Status: DC

## 2019-06-09 NOTE — Telephone Encounter (Signed)

## 2019-06-10 ENCOUNTER — Ambulatory Visit (AMBULATORY_SURGERY_CENTER): Payer: Medicare Other | Admitting: Gastroenterology

## 2019-06-10 ENCOUNTER — Other Ambulatory Visit: Payer: Self-pay

## 2019-06-10 ENCOUNTER — Encounter: Payer: Self-pay | Admitting: Gastroenterology

## 2019-06-10 VITALS — BP 114/63 | HR 53 | Temp 98.6°F | Resp 20 | Ht 64.0 in | Wt 277.0 lb

## 2019-06-10 DIAGNOSIS — D124 Benign neoplasm of descending colon: Secondary | ICD-10-CM | POA: Diagnosis not present

## 2019-06-10 DIAGNOSIS — D123 Benign neoplasm of transverse colon: Secondary | ICD-10-CM | POA: Diagnosis not present

## 2019-06-10 DIAGNOSIS — D122 Benign neoplasm of ascending colon: Secondary | ICD-10-CM | POA: Diagnosis not present

## 2019-06-10 DIAGNOSIS — D125 Benign neoplasm of sigmoid colon: Secondary | ICD-10-CM

## 2019-06-10 DIAGNOSIS — Z1211 Encounter for screening for malignant neoplasm of colon: Secondary | ICD-10-CM | POA: Diagnosis not present

## 2019-06-10 MED ORDER — SODIUM CHLORIDE 0.9 % IV SOLN: 500.0000 mL | Freq: Once | INTRAVENOUS | Status: DC

## 2019-06-10 NOTE — Progress Notes (Signed)
To PACU, VSS. Report to [email or web address] 

## 2019-06-10 NOTE — Progress Notes (Addendum)
Riki Sheer, LPN - [facility] - Temp    Pt's states no medical or surgical changes since previsit or office visit.

## 2019-06-10 NOTE — Op Note (Signed)
[facility] Patient Name: [name] Procedure Date: 06/10/2019 11:01 AM MRN: [id number] Endoscopist: [place]. Loletha Carrow , MD Age: 68 Referring MD:  Date of Birth: [birth year] Gender: Female Account #: [id number] Procedure:                Colonoscopy Indications:              Screening for colorectal malignant neoplasm                            (patient reported no polyps on 2009 colonoscopy) Medicines:                Monitored Anesthesia Care Procedure:                Pre-Anesthesia Assessment:                           - Prior to the procedure, a History and Physical                            was performed, and patient medications and                            allergies were reviewed. The patient's tolerance of                            previous anesthesia was also reviewed. The risks                            and benefits of the procedure and the sedation                            options and risks were discussed with the patient.                            All questions were answered, and informed consent                            was obtained. Prior Anticoagulants: The patient has                            taken no previous anticoagulant or antiplatelet                            agents except for aspirin. ASA Grade Assessment:                            III - A patient with severe systemic disease. After                            reviewing the risks and benefits, the patient was                            deemed in satisfactory condition to undergo the  procedure.                           After obtaining informed consent, the colonoscope                            was passed under direct vision. Throughout the                            procedure, the patient's blood pressure, pulse, and                            oxygen saturations were monitored continuously. The                            Colonoscope was introduced through the anus and                             advanced to the the cecum, identified by                            appendiceal orifice and ileocecal valve. The                            colonoscopy was performed without difficulty. The                            patient tolerated the procedure well. The quality                            of the bowel preparation was good. The ileocecal                            valve, appendiceal orifice, and rectum were                            photographed. Scope In: 11:09:57 AM Scope Out: 11:26:35 AM Scope Withdrawal Time: 0 hours 13 minutes 37 seconds  Total Procedure Duration: 0 hours 16 minutes 38 seconds  Findings:                 The perianal and digital rectal examinations were                            normal.                           Three sessile polyps were found in the transverse                            colon and ascending colon. The polyps were                            diminutive in size. These polyps were removed with  a cold biopsy forceps. Resection and retrieval were                            complete.                           Three sessile polyps were found in the sigmoid                            colon and descending colon. The polyps were                            diminutive in size. These polyps were removed with                            a cold biopsy forceps. Resection and retrieval were                            complete.                           Multiple diverticula were found in the left colon.                           Multiple scars were found in the distal rectum                            (prior hemorrhoidectomy).                           The exam was otherwise without abnormality on                            direct and retroflexion views. Complications:            No immediate complications. Estimated Blood Loss:     Estimated blood loss was minimal. Impression:               - Three diminutive  polyps in the transverse colon                            and in the ascending colon, removed with a cold                            biopsy forceps. Resected and retrieved.                           - Three diminutive polyps in the sigmoid colon and                            in the descending colon, removed with a cold biopsy                            forceps. Resected and retrieved.                           -  Diverticulosis in the left colon.                           - Scars in the distal rectum from prior hemorrhoid                            surgery.                           - The examination was otherwise normal on direct                            and retroflexion views. Recommendation:           - Patient has a contact number available for                            emergencies. The signs and symptoms of potential                            delayed complications were discussed with the                            patient. Return to normal activities tomorrow.                            Written discharge instructions were provided to the                            patient.                           - Resume previous diet.                           - Continue present medications.                           - Await pathology results.                           - Repeat colonoscopy is recommended for                            surveillance. The colonoscopy date will be                            determined after pathology results from today's                            exam become available for review. Margret Moat L. Loletha Carrow, MD 06/10/2019 11:36:17 AM This report has been signed electronically.

## 2019-06-10 NOTE — Patient Instructions (Signed)
Handouts Provided:  Polyps  YOU HAD AN ENDOSCOPIC PROCEDURE TODAY AT [facility]:   Refer to the procedure report that was given to you for any specific questions about what was found during the examination.  If the procedure report does not answer your questions, please call your gastroenterologist to clarify.  If you requested that your care partner not be given the details of your procedure findings, then the procedure report has been included in a sealed envelope for you to review at your convenience later.  YOU SHOULD EXPECT: Some feelings of bloating in the abdomen. Passage of more gas than usual.  Walking can help get rid of the air that was put into your GI tract during the procedure and reduce the bloating. If you had a lower endoscopy (such as a colonoscopy or flexible sigmoidoscopy) you may notice spotting of blood in your stool or on the toilet paper. If you underwent a bowel prep for your procedure, you may not have a normal bowel movement for a few days.  Please Note:  You might notice some irritation and congestion in your nose or some drainage.  This is from the oxygen used during your procedure.  There is no need for concern and it should clear up in a day or so.  SYMPTOMS TO REPORT IMMEDIATELY:   Following lower endoscopy (colonoscopy or flexible sigmoidoscopy):  Excessive amounts of blood in the stool  Significant tenderness or worsening of abdominal pains  Swelling of the abdomen that is new, acute  Fever of 100F or higher  For urgent or emergent issues, a gastroenterologist can be reached at any hour by calling [phone].   DIET:  We do recommend a small meal at first, but then you may proceed to your regular diet.  Drink plenty of fluids but you should avoid alcoholic beverages for 24 hours.  ACTIVITY:  You should plan to take it easy for the rest of today and you should NOT DRIVE or use heavy machinery until tomorrow (because of the sedation  medicines used during the test).    FOLLOW UP: Our staff will call the number listed on your records 48-72 hours following your procedure to check on you and address any questions or concerns that you may have regarding the information given to you following your procedure. If we do not reach you, we will leave a message.  We will attempt to reach you two times.  During this call, we will ask if you have developed any symptoms of COVID 19. If you develop any symptoms (ie: fever, flu-like symptoms, shortness of breath, cough etc.) before then, please call [phone].  If you test positive for Covid 19 in the 2 weeks post procedure, please call and report this information to [place].    If any biopsies were taken you will be contacted by phone or by letter within the next 1-3 weeks.  Please call us at [phone] if you have not heard about the biopsies in 3 weeks.    SIGNATURES/CONFIDENTIALITY: You and/or your care partner have signed paperwork which will be entered into your electronic medical record.  These signatures attest to the fact that that the information above on your After Visit Summary has been reviewed and is understood.  Full responsibility of the confidentiality of this discharge information lies with you and/or your care-partner.

## 2019-06-10 NOTE — Progress Notes (Signed)
Called to room to assist during endoscopic procedure.  Patient ID and intended procedure confirmed with present staff. Received instructions for my participation in the procedure from the performing physician.  

## 2019-06-12 ENCOUNTER — Telehealth: Payer: Self-pay | Admitting: *Deleted

## 2019-06-12 ENCOUNTER — Encounter: Payer: Self-pay | Admitting: Gastroenterology

## 2019-06-12 NOTE — Telephone Encounter (Signed)
  Follow up Call-  Call back number 06/10/2019  Post procedure Call Back phone  # [phone] cell  Permission to leave phone message Yes  Some recent data might be hidden     Patient questions:  Do you have a fever, pain , or abdominal swelling? No. Pain Score  0 *  Have you tolerated food without any problems? Yes.    Have you been able to return to your normal activities? Yes.    Do you have any questions about your discharge instructions: Diet   No. Medications  No. Follow up visit  No.  Do you have questions or concerns about your Care? No.  Actions: * If pain score is 4 or above: No action needed, pain <4.  1. Have you developed a fever since your procedure? no  2.   Have you had an respiratory symptoms (SOB or cough) since your procedure? no  3.   Have you tested positive for COVID 19 since your procedure no  4.   Have you had any family members/close contacts diagnosed with the COVID 19 since your procedure?  no   If yes to any of these questions please route to [name], RN and Alphonsa Gin, Therapist, sports.

## 2019-06-26 ENCOUNTER — Encounter: Payer: Self-pay | Admitting: Family Medicine

## 2019-06-26 ENCOUNTER — Ambulatory Visit (INDEPENDENT_AMBULATORY_CARE_PROVIDER_SITE_OTHER): Payer: Medicare Other | Admitting: Family Medicine

## 2019-06-26 ENCOUNTER — Other Ambulatory Visit: Payer: Self-pay

## 2019-06-26 VITALS — BP 130/64 | HR 56 | Wt 272.0 lb

## 2019-06-26 DIAGNOSIS — G894 Chronic pain syndrome: Secondary | ICD-10-CM

## 2019-06-26 DIAGNOSIS — G8929 Other chronic pain: Secondary | ICD-10-CM | POA: Diagnosis not present

## 2019-06-26 DIAGNOSIS — K929 Disease of digestive system, unspecified: Secondary | ICD-10-CM | POA: Diagnosis not present

## 2019-06-26 DIAGNOSIS — I152 Hypertension secondary to endocrine disorders: Secondary | ICD-10-CM

## 2019-06-26 DIAGNOSIS — E1159 Type 2 diabetes mellitus with other circulatory complications: Secondary | ICD-10-CM | POA: Diagnosis not present

## 2019-06-26 DIAGNOSIS — I10 Essential (primary) hypertension: Secondary | ICD-10-CM

## 2019-06-26 DIAGNOSIS — E119 Type 2 diabetes mellitus without complications: Secondary | ICD-10-CM

## 2019-06-26 DIAGNOSIS — E7800 Pure hypercholesterolemia, unspecified: Secondary | ICD-10-CM | POA: Diagnosis not present

## 2019-06-26 NOTE — Patient Instructions (Signed)
Decrease Bentyl to once a day, if possible.

## 2019-06-27 ENCOUNTER — Other Ambulatory Visit: Payer: Self-pay | Admitting: Family Medicine

## 2019-06-27 ENCOUNTER — Encounter: Payer: Self-pay | Admitting: Family Medicine

## 2019-06-27 DIAGNOSIS — M48061 Spinal stenosis, lumbar region without neurogenic claudication: Secondary | ICD-10-CM

## 2019-06-27 DIAGNOSIS — G2581 Restless legs syndrome: Secondary | ICD-10-CM

## 2019-06-27 DIAGNOSIS — G894 Chronic pain syndrome: Secondary | ICD-10-CM

## 2019-06-27 DIAGNOSIS — M160 Bilateral primary osteoarthritis of hip: Secondary | ICD-10-CM

## 2019-06-27 DIAGNOSIS — M47896 Other spondylosis, lumbar region: Secondary | ICD-10-CM

## 2019-06-27 LAB — MICROALBUMIN / CREATININE URINE RATIO
Creatinine, Urine: 81.4 mg/dL
Microalb/Creat Ratio: 4 mg/g creat (ref 0–29)
Microalbumin, Urine: 3.2 ug/mL

## 2019-06-27 LAB — LIPID PANEL
Chol/HDL Ratio: 4.5 ratio — ABNORMAL HIGH (ref 0.0–4.4)
Cholesterol, Total: 218 mg/dL — ABNORMAL HIGH (ref 100–199)
HDL: 48 mg/dL (ref >39)
LDL Calculated: 126 mg/dL — ABNORMAL HIGH (ref 0–99)
Triglycerides: 220 mg/dL — ABNORMAL HIGH (ref 0–149)
VLDL Cholesterol Cal: 44 mg/dL — ABNORMAL HIGH (ref 5–40)

## 2019-06-27 MED ORDER — MORPHINE SULFATE ER 15 MG PO TBCR: 15.0000 mg | EXTENDED_RELEASE_TABLET | Freq: Two times a day (BID) | ORAL | 0 refills | Status: DC

## 2019-06-27 MED ORDER — MORPHINE SULFATE ER 15 MG PO TBCR: EXTENDED_RELEASE_TABLET | ORAL | 0 refills | Status: DC

## 2019-06-27 MED ORDER — HYDROCODONE-ACETAMINOPHEN 10-325 MG PO TABS: 1.0000 | ORAL_TABLET | Freq: Three times a day (TID) | ORAL | 0 refills | Status: DC | PRN

## 2019-06-27 MED ORDER — BLOOD GLUCOSE MONITOR KIT: PACK | 0 refills | Status: DC

## 2019-06-27 NOTE — Assessment & Plan Note (Addendum)
Established problem Lipid Panel today Will likely recommend for high-intensity statin  Therapy.

## 2019-06-27 NOTE — Assessment & Plan Note (Signed)
Dr. Leonette Nutting at [facility] for fracture bone spur last month.

## 2019-06-27 NOTE — Assessment & Plan Note (Addendum)
Established problem Controlled DIABETES GOALS:  Glucose Control: Target HgA1C: < 7.0%  ACE/ARB (if BP >140/80 or proteinuria): Yes STATIN: Needs lipid panel and likely Statin Aspirin: not required for primary prevention LAST EYE EXAM (Annually): none.  [facility]  FOOT EXAM (Annually): Today MICROALB (last yr): Pending BMI >25 (nutrition counseling): Referral to  Diabetes and Nutrition Mngt made. Exercise (181min/week): will need to address next visit.  Will need gradual exercise prescription If smoker, encourage Tobacco Cessation: N/A Vaccinations: Pneumovac (1x<65, repeat x1 after 5 yr at 76), FLU (yearly), Td: up to date.  Recommend Influenza vaccination when becomes available.  RTC 3 months for recheck

## 2019-06-27 NOTE — Progress Notes (Signed)
Patient ID: [name], female   DOB: [birth year], 68 y.o.   MRN: [id number]  CHRONIC DIABETES New diagnosis on 05/21/19 [facility] ov  Disease Monitoring  Blood Sugar Ranges: not checking at home  Polyuria: no   Visual problems: no   Recent Medication Changes:  no   Medication Regiment Adherence: [name] is not taking any antidiabetes meds  Diet Pattern (Number & Time of meals): erratic; not taking opportunity to choose nutritious options  Recent Physical Illness: no   Emotional Stressors: Relationship moving out of her dgt's home into a new  home with another dgt, unable to Hernandez bc [name] .  Level of Activity: sedentary.  Spending large portion day in bed   [name]                          Taking Prescribed Statin: no             Taking Prescribed ACEI/ARB: yes                 Last Eye Exam:              Last Nutrition Consult: none    Chronic Pain Syndrome - locations bilateral upper thorax posteriorly, left buttock, and right ankle - HPI Uses Vicodin as well as Topical balms, which does help  ADE with analgesic therapies: constipation Non-allopathic therapites:none  Pain Inventory Average Pain: 7 Pain Right Now: 7  What TIME of day is your pain at its worst? night Sleep (in general): One Vicodin at bedtime stops her severe restless leg symptoms. Pain is worse with: walking and prolonged sitting Pain improves with: prone position Relief from Meds: 90 (percentage relief)  Mobility walk without assistance: yes use a cane/walker: no ability to climb steps?: yes, but difficult.  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  No changes to psychotropic regiment.  PHQ9 = 13 today (06/26/19)  somewhat to very difficult,  with Answer 9 = 0   Morbid Obesity - Weightdown 5 pounds since diagnosis of DMT2 about 1 month ago. Working on making more nutritious choices for sncks   Bipolar 2 disorder - longstanding issu - improved with [name] therapy back in January but has been unable to have repeat tx bc of pandemic. Marland Kitchen Continues o citalopram, vistaril, lorazepam, and trazodone.  CHRONIC HYPERTENSION  Disease Monitoring  Blood pressure range: not checking at home  Chest pain: no   Dyspnea: no   Claudication: no   Medication compliance: yes  Medication Side Effects  Lightheadedness: no   Urinary frequency: no   Edema: yes, trace at ankles    Preventitive Healthcare:  Exercise: no   Diet Pattern: eating most calories at evening meal and in evening snacks  Salt Restriction: no  SH: No smoking  ROS: See HPI  Physical exam Vitals:   08/15/18 1353  BP: (!) 150/68  Pulse: 70  Temp: 98.1 F (36.7 C)  SpO2: 95%   .General; Obese, groomed, distressed intermittently due to left should pain per pt Cor: RRR, no M Lung: BCTA, no acc mm use MSK: left neck with just neck pain with vertical compressions, nontender in interscap area, neg impingement.  Neuro: intact bicep and tricap DTR bil symm; 5/5 grip/elbow, shoulder strength.  Normal gait.  Psych: euthymic/normal speech/ concrete language/ goal directed thoughts  Diabetic Foot Exam - Simple   Simple Foot Form Visual Inspection No deformities, no ulcerations, no other skin breakdown bilaterally: Yes Sensation Testing Intact to touch and monofilament testing bilaterally: Yes Pulse Check Posterior Tibialis and Dorsalis pulse intact bilaterally: Yes Comments

## 2019-06-27 NOTE — Assessment & Plan Note (Signed)
Established problem Stable. Decrease Bentyl from twice day with meals to once a day with a meal.  Goal of reducing to PRN use, or ideally, stopping altogether.

## 2019-06-27 NOTE — Assessment & Plan Note (Signed)
Established problem Controlled Continue current therapy regiment. Rx for MS Contin 2 monthly prescriptions and 2 HC/APAP 5 mg Rxs sent into pharmacy Review of [place] CSRS DB showed no evidence of doctor shopping.

## 2019-06-27 NOTE — Assessment & Plan Note (Signed)
Established problem that has improved.  Down 5 pounds from last ov.  Referral to Diabetes and Nutrition Mngt made.

## 2019-06-30 ENCOUNTER — Encounter: Payer: Self-pay | Admitting: Family Medicine

## 2019-06-30 ENCOUNTER — Telehealth: Payer: Self-pay | Admitting: Family Medicine

## 2019-06-30 DIAGNOSIS — E1169 Type 2 diabetes mellitus with other specified complication: Secondary | ICD-10-CM | POA: Insufficient documentation

## 2019-06-30 DIAGNOSIS — E7800 Pure hypercholesterolemia, unspecified: Secondary | ICD-10-CM

## 2019-06-30 DIAGNOSIS — E119 Type 2 diabetes mellitus without complications: Secondary | ICD-10-CM

## 2019-06-30 DIAGNOSIS — E669 Obesity, unspecified: Secondary | ICD-10-CM

## 2019-06-30 HISTORY — DX: Obesity, unspecified: E11.69

## 2019-06-30 MED ORDER — ATORVASTATIN CALCIUM 20 MG PO TABS: 20.0000 mg | ORAL_TABLET | Freq: Every day | ORAL | 99 refills | Status: DC

## 2019-06-30 NOTE — Telephone Encounter (Signed)
I spoke with [name] about her elevated LDL chgolesterol.  She agreed to starting atorvastatin 20 mg daily.  If she tolerates this dose, will increase dose to 40 mg daily given her DM and multiple other ASCVD risk factors.

## 2019-07-02 ENCOUNTER — Ambulatory Visit (INDEPENDENT_AMBULATORY_CARE_PROVIDER_SITE_OTHER): Payer: Medicare Other

## 2019-07-02 ENCOUNTER — Other Ambulatory Visit: Payer: Self-pay

## 2019-07-02 VITALS — BP 125/65 | HR 63 | Temp 98.7°F | Ht 63.75 in | Wt 265.0 lb

## 2019-07-02 DIAGNOSIS — Z0000 Encounter for general adult medical examination without abnormal findings: Secondary | ICD-10-CM

## 2019-07-02 DIAGNOSIS — Z23 Encounter for immunization: Secondary | ICD-10-CM

## 2019-07-02 NOTE — Patient Instructions (Addendum)
You spoke to [name], [place]  for your annual wellness visit.  We discussed goals: Goals    . Exercise 3x per week     Walking 10-15 minutes in the AM and 10-15 minutes in PM 3x a week.    Marland Kitchen HEMOGLOBIN A1C < 7.0    . Weight (lb) < 226 lb (102.5 kg)     5% weight loss       We also discussed recommended health maintenance. As discussed, you are due for your mammogram and eye exam. You received your flu vaccine today!   Health Maintenance  Topic Date Due  . FOOT EXAM  07/20/1968  . OPHTHALMOLOGY EXAM  07/20/1968  . INFLUENZA VACCINE  06/07/2019  . HEMOGLOBIN A1C  11/21/2019  . MAMMOGRAM  01/10/2020  . COLONOSCOPY  06/09/2022  . TETANUS/TDAP  10/21/2023  . DEXA SCAN  Completed  . Hepatitis C Screening  Completed  . PNA vac Low Risk Adult  Completed   Fill out advanced directive packet. Schedule your mammogram and eye apt!  Keep up the good work with losing weight and healthier eating! Add some light exercise to your day. Walking 10-15 minutes AM and 10-15 minutes PM 3x weekly.   Preventive Care 69 Years and Older, Female Preventive care refers to lifestyle choices and visits with your health care provider that can promote health and wellness. This includes:  A yearly physical exam. This is also called an annual well check.  Regular dental and eye exams.  Immunizations.  Screening for certain conditions.  Healthy lifestyle choices, such as diet and exercise. What can I expect for my preventive care visit? Physical exam Your health care provider will check:  Height and weight. These may be used to calculate body mass index (BMI), which is a measurement that tells if you are at a healthy weight.  Heart rate and blood pressure.  Your skin for abnormal spots. Counseling Your health care provider may ask you questions about:  Alcohol, tobacco, and drug use.  Emotional well-being.  Home and relationship well-being.  Sexual activity.  Eating  habits.  History of falls.  Memory and ability to understand (cognition).  Work and work Statistician.  Pregnancy and menstrual history. What immunizations do I need?  Influenza (flu) vaccine  This is recommended every year. Tetanus, diphtheria, and pertussis (Tdap) vaccine  You may need a Td booster every 10 years. Varicella (chickenpox) vaccine  You may need this vaccine if you have not already been vaccinated. Zoster (shingles) vaccine  You may need this after age 56. Pneumococcal conjugate (PCV13) vaccine  One dose is recommended after age 45. Pneumococcal polysaccharide (PPSV23) vaccine  One dose is recommended after age 18. Measles, mumps, and rubella (MMR) vaccine  You may need at least one dose of MMR if you were born in 1957 or later. You may also need a second dose. Meningococcal conjugate (MenACWY) vaccine  You may need this if you have certain conditions. Hepatitis A vaccine  You may need this if you have certain conditions or if you travel or work in places where you may be exposed to hepatitis A. Hepatitis B vaccine  You may need this if you have certain conditions or if you travel or work in places where you may be exposed to hepatitis B. Haemophilus influenzae type b (Hib) vaccine  You may need this if you have certain conditions. You may receive vaccines as individual doses or as more than one vaccine together in one  shot (combination vaccines). Talk with your health care provider about the risks and benefits of combination vaccines. What tests do I need? Blood tests  Lipid and cholesterol levels. These may be checked every 5 years, or more frequently depending on your overall health.  Hepatitis C test.  Hepatitis B test. Screening  Lung cancer screening. You may have this screening every year starting at age 7 if you have a 30-pack-year history of smoking and currently smoke or have quit within the past 15 years.  Colorectal cancer screening.  All adults should have this screening starting at age 45 and continuing until age 62. Your health care provider may recommend screening at age 52 if you are at increased risk. You will have tests every 1-10 years, depending on your results and the type of screening test.  Diabetes screening. This is done by checking your blood sugar (glucose) after you have not eaten for a while (fasting). You may have this done every 1-3 years.  Mammogram. This may be done every 1-2 years. Talk with your health care provider about how often you should have regular mammograms.  BRCA-related cancer screening. This may be done if you have a family history of breast, ovarian, tubal, or peritoneal cancers. Other tests  Sexually transmitted disease (STD) testing.  Bone density scan. This is done to screen for osteoporosis. You may have this done starting at age 10. Follow these instructions at home: Eating and drinking  Eat a diet that includes fresh fruits and vegetables, whole grains, lean protein, and low-fat dairy products. Limit your intake of foods with high amounts of sugar, saturated fats, and salt.  Take vitamin and mineral supplements as recommended by your health care provider.  Do not drink alcohol if your health care provider tells you not to drink.  If you drink alcohol: ? Limit how much you have to 0-1 drink a day. ? Be aware of how much alcohol is in your drink. In the [place] one drink equals one 12 oz bottle of beer (355 mL), one 5 oz glass of wine (148 mL), or one 1 oz glass of hard liquor (44 mL). Lifestyle  Take daily care of your teeth and gums.  Stay active. Exercise for at least 30 minutes on 5 or more days each week.  Do not use any products that contain nicotine or tobacco, such as cigarettes, e-cigarettes, and chewing tobacco. If you need help quitting, ask your health care provider.  If you are sexually active, practice safe sex. Use a condom or other form of protection in order  to prevent STIs (sexually transmitted infections).  Talk with your health care provider about taking a low-dose aspirin or statin. What's next?  Go to your health care provider once a year for a well check visit.  Ask your health care provider how often you should have your eyes and teeth checked.  Stay up to date on all vaccines. This information is not intended to replace advice given to you by your health care provider. Make sure you discuss any questions you have with your health care provider. Document Released: 11/19/2015 Document Revised: 10/17/2018 Document Reviewed: 10/17/2018 Elsevier Patient Education  [employer].   Here is an example of what a healthy plate looks like:    ? Make half your plate fruits and vegetables.     ? Focus on whole fruits.     ? Vary your veggies.  ? Make half your grains whole grains. -     ?  Look for the word "whole" at the beginning of the ingredients list    ? Some whole-grain ingredients include whole oats, whole-wheat flour,        whole-grain corn, whole-grain brown rice, and whole rye.  ? Move to low-fat and fat-free milk or yogurt.  ? Vary your protein routine. - Meat, fish, poultry (chicken, [place]), eggs, beans (kidney, pinto), dairy.  ? Drink and eat less sodium, saturated fat, and added sugars.   Our clinic's number is [phone]. Please call with questions or concerns about what we discussed today.

## 2019-07-02 NOTE — Progress Notes (Signed)
Subjective:   [name] is a 68 y.o. female who presents for Medicare Annual (Subsequent) preventive examination.  The patient consented to a virtual visit.  Review of Systems: Defer to PCP   Cardiac Risk Factors include: advanced age (>35mn, >>40women);diabetes mellitus;obesity (BMI >30kg/m2);hypertension  Objective:    Vitals: BP 125/65   Pulse 63   Temp 98.7 F (37.1 C) (Oral)   Ht 5' 3.75" (1.619 m)   Wt 265 lb (120.2 kg)   SpO2 98%   BMI 45.84 kg/m   Body mass index is 45.84 kg/m.  Advanced Directives 07/02/2019 06/26/2019 05/21/2019 01/20/2019 11/14/2018 09/02/2018 05/16/2018  Does Patient Have a Medical Advance Directive? No No No No No No No  Would patient like information on creating a medical advance directive? Yes (MAU/Ambulatory/Procedural Areas - Information given) No - Patient declined No - Patient declined - No - Patient declined No - Patient declined No - Patient declined    Tobacco Social History   Tobacco Use  Smoking Status Former Smoker  . Packs/day: 1.50  . Years: 44.00  . Pack years: 66.00  . Types: Cigarettes  . Quit date: 04/06/2008  . Years since quitting: 11.2  Smokeless Tobacco Never Used     Clinical Intake:  Pre-visit preparation completed: Yes  Pain Score: 4   How often do you need to have someone help you when you read instructions, pamphlets, or other written materials from your doctor or pharmacy?: 2 - Rarely What is the last grade level you completed in school?: high school  Interpreter Needed?: No  Past Medical History:  Diagnosis Date  . Adrenal nodule ([place] 04/08/2011   04/08/11 Abdominal CT showed Left adrenal nodule which is tachnically indeterminate but most likely an adenoma.  It is 1.7 cm and 42 HU on portal venous phase image. 31 HU on delayed images.   01/09/2012 Abdominal CT w/ & w/o CM: Stable benign left adrenal adenoma. No further specific follow-up is needed for this finding.    . Allergic rhinitis 01/03/2007   Qualifier: Diagnosis of  By: KDrucie Ip   . Allergy   . Anemia    hx of  in 20's  . Anxiety   . Bipolar disorder ([place]   . Carpal tunnel syndrome 02/14/2007   S/P carpel tunnel release bilaterally.    . Cataracts, bilateral    removed both eyes  2017-2018  . Chronic pain syndrome 12/24/2008  . 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 COPD WITHOUT EXACERBATION 01/03/2007   Qualifier: Diagnosis of  By: KDrucie Ip emphysema  . Coronary artery calcification seen on CAT scan 12/03/2017   11/2017 Chest CT: Left anterior descending and right coronary atherosclerosis  . Diabetes mellitus without complication (HCC)    AV6H6.5 as of 05-2019- diet changes, no meds currently   . DISC WITH RADICULOPATHY 01/03/2007   Qualifier: Diagnosis of  By: KDrucie Ip   . Emphysema of lung ([place]   . Esophageal reflux 01/03/2007   Centricity Description: GASTROESOPHAGEAL REFLUX, NO ESOPHAGITIS Qualifier: Diagnosis of  By: KDrucie Ip  Centricity Description: GERD Qualifier: Diagnosis of  By: STye SavoyMD, WTommi Rumps   . External hemorrhoids s/p hemorrhoidectomy 03/28/2018 03/28/2018  . Fibromyalgia   . Fracture of bone spur of inferior portion of calcaneus 10/06/2018  . Functional gastrointestinal disorder 06/18/2008   Functional Gastrointestinal Disorder with frequent eructation and bloating sensation with acute flares.  Responsive to Compazine and Bentyl.  Takes a few days to calm down.    Marland Kitchen Heel spur, fracture, left 04/17/2018  . HEMORRHOIDS, NOS 01/03/2007   Qualifier: Diagnosis of  By: Drucie Ip    . Hepatic steatosis 04/12/2011  . Herpes simplex labialis 05/11/2011  . Hip osteoarthritis 05/23/2007   MRI Pelvis (03/13/07) Bilateral osteoarthritis of the hips, left greater than right.  The  patient has a slightly more prominent hip effusion on the left than  the right.  No other significant abnormality.    . History of MRSA infection 04/28/2011  .  HYPERCHOLESTEROLEMIA 12/24/2008  . Hypertension   . HYPERTENSION, BENIGN SYSTEMIC 01/03/2007   Qualifier: Diagnosis of  By: Drucie Ip    . HYPERTRIGLYCERIDEMIA 11/11/2007   Qualifier: Diagnosis of  By: McDiarmid MD, Sherren Mocha    . Hypokalemia 12/12/2017  . Hypothyroidism 02/15/2007   Qualifier: Diagnosis of  By: McDiarmid MD, Sherren Mocha    . HYPOTHYROIDISM, BORDERLINE 02/15/2007  . Irritable bowel syndrome 01/03/2007   Qualifier: Diagnosis of  By: Drucie Ip    . LACTOSE INTOLERANCE 03/10/2008  . Major depressive disorder, recurrent episode ([place]) 01/03/2007   Qualifier: Diagnosis of  By: Drucie Ip    . Medication management 12/14/2016   Managing topiramate for weight loss starting 12/14/16  . Obesity (BMI 30.0-34.9) 09/30/2007   Has lost 55 lbs since easter. Goal of <190 by 08/05/12.     . Obesity, Class III, BMI 40-49.9 (morbid obesity) ([place]) 09/30/2007       . Osteoarthritis of spine 01/03/2007   MR I Lumbar (08/03): GeneralDDD; L2-3 Large  HNP.  Mod pos  hnp - 12/14/2005, smhnpw/irritL4root - 12/14/2005    . OSTEOARTHRITIS, HANDS, BILATERAL 06/18/2008   Qualifier: Diagnosis of  By: McDiarmid MD, Sherren Mocha    . PATELLO FEMORAL STRESS SYNDROME 01/03/2007   Qualifier: Diagnosis of  By: Drucie Ip    . Periodic limb movements of sleep 12/05/2010   Diagnosed on Polysomnography testing Fall 2011.     Marland Kitchen Pneumonia    walking pneumonia in the 90's  . Pre-diabetes 12/25/2008   Qualifier: Diagnosis of  By: McDiarmid MD, Sherren Mocha    . Prolapsed internal hemorrhoids, grade 3, s/p ligation/pexy/hemorrhoidectomy 03/28/2018 03/28/2018  . RESTLESS LEGS SYNDROME 09/11/2007   Polysomnography (10/2010, Dr Keturah Barre, interpreter. ) showed periodic limb movements that frequently awoke patient from sleep with arousal index of 4 per hour. Dr Danton Sewer (Sleep Specialist) thought her RLS was the major contributor to patient poor sleep condition, more than any sleep apnea.  He recommended considering opamine agonist (either  requip or mirapex) to see if this will help.     . Rosacea, acne 10/24/2011  . SIGMOID POLYP 01/23/2008  . Solitary lung nodule 04/08/2011  . Stress incontinence   . Synovial cyst of lumbar facet joint 02/17/2016  . Trigger thumb of left hand 08/16/2018  . Urticaria, chronic 05/22/2011  . VITAMIN D DEFICIENCY 03/03/2010  . Work-related condition 05/17/2018   Past Surgical History:  Procedure Laterality Date  . APPENDECTOMY    . bladder tack  1998   Dr Jeffie Pollock (urology)  . BUNIONECTOMY  1992   bilateral feet with pins in great toes  . CARDIAC CATHETERIZATION    . CARPAL TUNNEL RELEASE  2010   Bilateral, Dr Theodis Sato  . COLONOSCOPY  2004   Dr Verdia Kuba (GI)  . CYSTOCELE REPAIR  1998    Dr Gertie Fey (GYN)-  . ESOPHAGOGASTRODUODENOSCOPY    . EVALUATION UNDER ANESTHESIA WITH HEMORRHOIDECTOMY N/A 03/28/2018  Procedure: ANORECTAL EXAM UNDER ANESTHESIA lateral internal partial sphincterotomy, hemorrhoidal ligation pixie, HEMORRHOIDECTOMY;  Surgeon: Michael Boston, MD;  Location: [facility];  Service: General;  Laterality: N/A;  . EXAM ANORECTAL W/ ULTRASOUND     Dr. Johney Maine 03-28-18   . EXPLORATORY LAPAROTOMY    . FACIAL LACERATIONS REPAIR     Facial Laceration repair as adolescent   . INJECTION HIP INTRA ARTICULAR  2008   Left Hip fluoroscopically-guided corticosteorid injection for painful osteoarthritis with good effect (06/2007)  . LAPAROSCOPY FOR ECTOPIC PREGNANCY    . LUMBAR LAMINECTOMY/DECOMPRESSION MICRODISCECTOMY N/A 06/21/2016   Procedure: MICRO LUMBAR DECOMPRESSION L4-L5 AND REMOVAL OF SYNOVIAL CYST    1 LEVEL;  Surgeon: Susa Day, MD;  Location: [facility];  Service: Orthopedics;  Laterality: N/A;  . NM MYOVIEW LTD  2011   Dr Daneen Schick, III (Card)  . RECTOCELE REPAIR  1998   Dr Gertie Fey (GYN)  . SPHINCTEROTOMY N/A 03/28/2018   Procedure: ANAL SPHINCTEROTOMY;  Surgeon: Michael Boston, MD;  Location: [facility];  Service: General;  Laterality: N/A;  . UPPER GASTROINTESTINAL ENDOSCOPY      Family History  Problem Relation Age of Onset  . Diabetes Father   . Heart disease Father   . Diabetes type II Other   . Hypertension Other   . Heart attack Other   . Alcohol abuse Other   . Depression Other   . Asthma Other   . Migraines Other   . Hypertension Mother   . Hypertension Sister   . Obesity Sister   . Kidney disease Brother   . Hypertension Daughter   . Hypertension Son   . Heart disease Brother   . Hypertension Brother   . Bipolar disorder Daughter   . Hypertension Daughter   . Hypertension Son   . Colon polyps Son   . Esophageal cancer Neg Hx   . Stomach cancer Neg Hx   . Rectal cancer Neg Hx   . Colon cancer Neg Hx    Social History   Socioeconomic History  . Marital status: Divorced    Spouse name: Not on file  . Number of children: 4  . Years of education: 41  . Highest education level: 12th grade  Occupational History  . Occupation: Surveyor, quantity: [employer]  . Financial resource strain: Not very hard  . Food insecurity    Worry: Never true    Inability: Never true  . Transportation needs    Medical: No    Non-medical: No  Tobacco Use  . Smoking status: Former Smoker    Packs/day: 1.50    Years: 44.00    Pack years: 66.00    Types: Cigarettes    Quit date: 04/06/2008    Years since quitting: 11.2  . Smokeless tobacco: Never Used  Substance and Sexual Activity  . Alcohol use: No    Frequency: Never  . Drug use: No    Comment: hx of marijuana use - last used 2 months ago   . Sexual activity: Not Currently  Lifestyle  . Physical activity    Days per week: 0 days    Minutes per session: 0 min  . Stress: To some extent  Relationships  . Social Herbalist on phone: Twice a week    Gets together: More than three times a week    Attends religious service: More than 4 times per year    Active member of club or organization:  No    Attends meetings of clubs or organizations: Never    Relationship status:  Divorced  Other Topics Concern  . Not on file  Social History Narrative   Retired [place] [employer] worker - early retirement for mental health reasons.    Has Worked part-time  at [employer].Marland KitchenMarland KitchenWorking as Scientist, water quality at [employer] on [employer]   pt is separated.    Living with her dgt and son-in-law in two story home.   1 flight of stairs     Quit smoking tobacco June 09 started at age 73. 1 1/2 to 2 ppd .   No etoh    Hx of smokes marijuana - periodically attempts quitting.   Religious - Pentecostal   4 children    Hobbies- word searches and games on her tablet.   No IVDA    Outpatient Encounter Medications as of 07/02/2019  Medication Sig  . atorvastatin (LIPITOR) 20 MG tablet Take 1 tablet (20 mg total) by mouth daily.  . blood glucose meter kit and supplies KIT Use up to three times daily as directed  . Calcium Carb-Cholecalciferol (CALCIUM 1000 + D) 1000-800 MG-UNIT TABS Take by mouth.  . Carboxymethylcellulose Sodium (THERATEARS OP) Place 2 drops into both eyes 4 (four) times daily as needed (dry eyes).  . citalopram (CELEXA) 40 MG tablet Take 0.5 tablets (20 mg total) by mouth at bedtime. (Patient taking differently: Take 40 mg by mouth daily. )  . dicyclomine (BENTYL) 20 MG tablet TAKE 1/2 TO 1 TABLET 4     TIMES A DAY BEFORE MEALS   AND AT BEDTIME (Patient taking differently: 1 TABLET once daily)  . fexofenadine (ALLEGRA) 180 MG tablet Take 180 mg by mouth daily.  . fluticasone (FLONASE) 50 MCG/ACT nasal spray Place 2 sprays into both nostrils daily.  . hydrochlorothiazide (HYDRODIURIL) 25 MG tablet TAKE 1 TABLET DAILY  . [START ON 07/11/2019] HYDROcodone-acetaminophen (NORCO) 10-325 MG tablet Take 1 tablet by mouth every 8 (eight) hours as needed for moderate pain.  Derrill Memo ON 08/11/2019] HYDROcodone-acetaminophen (NORCO) 10-325 MG tablet Take 1 tablet by mouth every 8 (eight) hours as needed.  . hydrOXYzine (VISTARIL) 50 MG capsule Take 150 mg by mouth at bedtime.   Marland Kitchen levothyroxine  (SYNTHROID, LEVOTHROID) 50 MCG tablet TAKE 3 TABLETS BY MOUTH EVERY DAY BEFORE BREAKFAST (Patient taking differently: TAKE 2 1/2  TABLETS BY MOUTH EVERY DAY BEFORE BREAKFAST)  . LORazepam (ATIVAN) 1 MG tablet Take 1 mg by mouth 3 (three) times daily.   . metoprolol tartrate (LOPRESSOR) 50 MG tablet Take 1 tablet (50 mg total) by mouth 2 (two) times daily.  Derrill Memo ON 07/11/2019] morphine (MS CONTIN) 15 MG 12 hr tablet TAKE 1 TABLET BY MOUTH EVERY 12 HOURS  . [START ON 08/10/2019] morphine (MS CONTIN) 15 MG 12 hr tablet Take 1 tablet (15 mg total) by mouth every 12 (twelve) hours.  . Multiple Minerals-Vitamins (CALCIUM-MAGNESIUM-ZINC-D3) TABS Take 3 tablets by mouth daily.  . nitroGLYCERIN (NITROSTAT) 0.4 MG SL tablet Place 1 tablet (0.4 mg total) under the tongue every 5 (five) minutes as needed for chest pain.  Marland Kitchen omeprazole (PRILOSEC) 40 MG capsule TAKE 1 CAPSULE DAILY  . polyethylene glycol (MIRALAX) powder Take 17 g by mouth as needed for mild constipation.   . potassium chloride (KLOR-CON M10) 10 MEQ tablet Take 4 tablets (40 mEq total) by mouth at bedtime.  . prazosin (MINIPRESS) 1 MG capsule Take 1 capsule (1 mg total) by mouth at bedtime.  Marland Kitchen  ramipril (ALTACE) 10 MG capsule TAKE 1 CAPSULE DAILY  . Simethicone (GAS-X PO) Take 2 tablets by mouth daily as needed (gas).  Marland Kitchen tiZANidine (ZANAFLEX) 2 MG tablet Take 2 mg by mouth at bedtime. occ uses an  Additional dose in  the day  . traZODone (DESYREL) 100 MG tablet Take 300 mg by mouth at bedtime.   . vitamin B-12 (CYANOCOBALAMIN) 1000 MCG tablet Take 1,000 mcg by mouth daily.  . vitamin C (ASCORBIC ACID) 500 MG tablet Take 500 mg by mouth daily.   No facility-administered encounter medications on file as of 07/02/2019.     Activities of Daily Living In your present state of health, do you have any difficulty performing the following activities: 07/02/2019  Hearing? N  Vision? N  Difficulty concentrating or making decisions? N  Walking or  climbing stairs? Y  Dressing or bathing? N  Doing errands, shopping? N  Preparing Food and eating ? N  Using the Toilet? N  In the past six months, have you accidently leaked urine? Y  Do you have problems with loss of bowel control? N  Managing your Medications? N  Managing your Finances? N  Housekeeping or managing your Housekeeping? N  Some recent data might be hidden    Patient Care Team: McDiarmid, Blane Ohara, MD as PCP - General Care, [facility] as Consulting Physician (Pain Medicine) Susa Day, MD as Consulting Physician (Orthopedic Surgery) Kennith Center, RD as Dietitian (Family Medicine) Michael Boston, MD as Consulting Physician (General Surgery)    Assessment:   This is a routine wellness examination for Rossi.  Exercise Activities and Dietary recommendations Current Exercise Habits: The patient does not participate in regular exercise at present, Exercise limited by: respiratory conditions(s)  Patient is eating more air fried lean meats and vegetables. Patient is continuing to lose weight.   Goals    . Exercise 3x per week     Walking 10-15 minutes in the AM and 10-15 minutes in PM 3x a week.    Marland Kitchen HEMOGLOBIN A1C < 7.0    . Weight (lb) < 226 lb (102.5 kg)     5% weight loss       Fall Risk Fall Risk  07/02/2019 05/21/2019 05/16/2018 10/04/2017 05/31/2017  Falls in the past year? 0 1 No No No  Number falls in past yr: - 0 - - -  Injury with Fall? - 0 - - -  Risk Factor Category  - - - - -  Risk for fall due to : Impaired balance/gait - - Impaired balance/gait -   Is the patient's home free of loose throw rugs in walkways, pet beds, electrical cords, etc?  yes      Grab bars in the bathroom? yes      Handrails on the stairs?   no      Adequate lighting?   yes  Patient rating of health (0-10) scale: 4  Depression Screen PHQ 2/9 Scores 07/02/2019 08/15/2018 05/16/2018 02/07/2018  PHQ - 2 Score 1 0 2 2  PHQ- 9 Score - 5 - 10  Exception  Documentation - - Other- indicate reason in comment box -  Not completed - - patient is being followed by psychiatry -     Cognitive Function   6CIT Screen 07/02/2019  What Year? 0 points  What month? 0 points  What time? 0 points  Count back from 20 0 points  Months in reverse 0 points  Repeat phrase  0 points  Total Score 0    Immunization History  Administered Date(s) Administered  . Influenza Split 07/22/2012  . Influenza Whole 08/07/2007, 08/07/2008, 08/10/2009, 07/22/2010  . Influenza, High Dose Seasonal PF 06/27/2017, 07/24/2018  . Influenza,inj,Quad PF,6+ Mos 07/05/2017, 07/02/2019  . Influenza-Unspecified 07/06/2013, 07/06/2014, 06/09/2015, 07/07/2016, 07/25/2018  . Pneumococcal Conjugate-13 10/07/2015  . Pneumococcal Polysaccharide-23 08/20/2014, 03/22/2018  . Td 03/06/2002  . Tdap 10/20/2013  . Zoster Recombinat (Shingrix) 05/05/2017, 12/07/2017    Screening Tests Health Maintenance  Topic Date Due  . FOOT EXAM  07/20/1961  . OPHTHALMOLOGY EXAM  07/20/1961  . INFLUENZA VACCINE  06/07/2019  . HEMOGLOBIN A1C  11/21/2019  . MAMMOGRAM  01/10/2020  . COLONOSCOPY  06/09/2022  . TETANUS/TDAP  10/21/2023  . DEXA SCAN  Completed  . Hepatitis C Screening  Completed  . PNA vac Low Risk Adult  Completed    Cancer Screenings: Lung: Low Dose CT Chest recommended if Age 10-80 years, 30 pack-year currently smoking OR have quit w/in 15years. Patient does not qualify. Breast:  Up to date on Mammogram? No  Due as of March 2020 Patient aware and will schedule an apt.  Up to date of Bone Density/Dexa? Yes Colorectal: 06/10/2019   Additional Screenings: Hepatitis C Screening: Completed   Plan:  Fill out advanced directive packet. Schedule your mammogram and eye apt!  Keep up the good work with losing weight and healthier eating! Add some light exercise to your day. Walking 10-15 minutes AM and 10-15 minutes PM 3x weekly. Bring your blood pressure cuff to next office visit,  so we can compare readings.  I have personally reviewed and noted the following in the patient's chart:   . Medical and social history . Use of alcohol, tobacco or illicit drugs  . Current medications and supplements . Functional ability and status . Nutritional status . Physical activity . Advanced directives . List of other physicians . Hospitalizations, surgeries, and ER visits in previous 12 months . Vitals . Screenings to include cognitive, depression, and falls . Referrals and appointments  In addition, I have reviewed and discussed with patient certain preventive protocols, quality metrics, and best practice recommendations. A written personalized care plan for preventive services as well as general preventive health recommendations were provided to patient.   Dorna Bloom, [place]  07/02/2019

## 2019-07-03 ENCOUNTER — Encounter: Payer: Medicare Other | Attending: Family Medicine | Admitting: Registered"

## 2019-07-03 ENCOUNTER — Encounter: Payer: Self-pay | Admitting: Registered"

## 2019-07-03 DIAGNOSIS — E119 Type 2 diabetes mellitus without complications: Secondary | ICD-10-CM

## 2019-07-03 NOTE — Progress Notes (Signed)
Diabetes Self-Management Education  Visit Type: First/Initial  Appt. Start Time: 1105 Appt. End Time: 1220  07/03/2019  [name], identified by name and date of birth, is a 68 y.o. female with a diagnosis of Diabetes: Type 2.   ASSESSMENT  There were no vitals taken for this visit. There is no height or weight on file to calculate BMI.   Pt is here with a new Dx of T2DM and states she has had pre-diabetes ~10 yrs. Pt states she was told when she was younger that she needed to lose weight or she would get diabetes. Patient states in 2017/2018 she lost 75 lbs and was able to reduce her BP medication. However, when her best friend died she regained the weight and medication increased again.  Pt states she has a meter but cannot figure out how it works and it is hard to see the screen as well. Pt states she is going to ask the pharmacist to help her with it and maybe ask for a different model.   Pt states she is living with one of her daughters and her bedroom is upstairs. Pt states she was eating a lot of snack type foods, nabs and such because it "tears me up to go up and down stairs" to fix meals. Since getting the diagnosis pt states she has cut down on the snacking and will have a banana instead of crackers, but mostly just eats 1x/day in the evening, not only because of stairs, but states her appetite has reduced and has lost 10 lbs since July 15 (~6 weeks). Pt also report reducing starch (rice) in her diet, still enjoys instant mashed potatoes sometimes.  Pt reports she is getting ready to move in with her "baby daughter" on Oct 23 on the first floor of the apartment complex where she used to live and it is 1 block to the fitness room.  Pt reports she spends 75-80% time in bed. Pt reports that sciatica makes it painful to stand very long and she sits to wash dishes & to cook. 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 Pt states her eating habits were different too, she would eat 2 sausage, egg & cheese mcmuffins before work and ate after work..  Sleep: 3-5 am, gets sleepy during the day and naps.Most of medicines first thing in the morning.  Pt states her dentures are not comfortable and preferes to eat without them. Pt states she needs 3 teeth pulled and other work done. Had appt in March had to put off d/t COVID  Next visit RD may discuss Mg for RSL which may also help with constipation.  Diabetes Self-Management Education - 07/03/19 1125      Visit Information   Visit Type  First/Initial      Initial Visit   Diabetes Type  Type 2    Are you currently following a meal plan?  No    Are you taking your medications as prescribed?  Not on Medications    Date Diagnosed  05/21/2019      Health Coping   How would you rate your overall health?  Fair      Psychosocial Assessment   Patient Belief/Attitude about Diabetes  Afraid    Self-care barriers  Unsteady gait/risk for falls    How often do  you need to have someone help you when you read instructions, pamphlets, or other written materials from your doctor or pharmacy?  2 - Rarely    What is the last grade level you completed in school?  some college      Complications   Last HgB A1C per patient/outside source  6.5 %    How often do you check your blood sugar?  0 times/day (not testing)    Have you had a dilated eye exam in the past 12 months?  No    Have you had a dental exam in the past 12 months?  Yes    Are you checking your feet?  Yes    How many days per week are you checking your feet?  3      Dietary Intake   Snack (morning)  fruit, bananas    Dinner  2 drum sticks (air fryer), corn, kale, pinto & navy beans    Beverage(s)  32 oz coke zero, goal 64 oz water, Tang w/ miralax 4-6x/week      Exercise   Exercise Type  ADL's    How many days per week to you exercise?  0     How many minutes per day do you exercise?  0    Total minutes per week of exercise  0      Patient Education   Previous Diabetes Education  No    Disease state   Definition of diabetes, type 1 and 2, and the diagnosis of diabetes    Nutrition management   Role of diet in the treatment of diabetes and the relationship between the three main macronutrients and blood glucose level    Physical activity and exercise   Helped patient identify appropriate exercises in relation to his/her diabetes, diabetes complications and other health issue.    Monitoring  Identified appropriate SMBG and/or A1C goals.      Individualized Goals (developed by patient)   Nutrition  General guidelines for healthy choices and portions discussed    Monitoring   test my blood glucose as discussed      Outcomes   Expected Outcomes  Demonstrated interest in learning. Expect positive outcomes    Future DMSE  4-6 wks    Program Status  Not Completed       Individualized Plan for Diabetes Self-Management Training:   Learning Objective:  Patient will have a greater understanding of diabetes self-management. Patient education plan is to attend individual and/or group sessions per assessed needs and concerns.    Patient Instructions  Cucumbers and radishes are a wonderful to include in your diet Consider using whole grain bread for sandwiches. Ideas for convenient foods you can keep in your room: Breakfast: Boost or Glucerna (look for glucose control) OR banana with nuts or peanut butter OR oatmeal with walnuts or a hard boiled eggs Lunch: Tuna or chicken salad snack pack. Think about signing up for the year long program with the ADA  Continue staying in contact with your family and friends with facetime or there safe means.    Expected Outcomes:  Demonstrated interest in learning. Expect positive outcomes  Education material provided: ADA - How to Thrive: A Guide for Your Journey with Diabetes, A1C conversion  sheet and Carbohydrate counting sheet, Glucerna coupon  If problems or questions, patient to contact team via:  Phone and MyChart  Future DSME appointment: 4-6 wks

## 2019-07-03 NOTE — Patient Instructions (Addendum)
Cucumbers and radishes are a wonderful to include in your diet Consider using whole grain bread for sandwiches. Ideas for convenient foods you can keep in your room: Breakfast: Boost or Glucerna (look for glucose control) OR banana with nuts or peanut butter OR oatmeal with walnuts or a hard boiled eggs Lunch: Tuna or chicken salad snack pack. Think about signing up for the year long program with the ADA  Continue staying in contact with your family and friends with facetime or there safe means.

## 2019-07-04 NOTE — Progress Notes (Signed)
Patient ID: [name], female   DOB: Oct 25, 1951, 68 y.o.   MRN: [id number]  I have reviewed this visit and agree with the documentation.

## 2019-07-22 ENCOUNTER — Encounter: Payer: Self-pay | Admitting: Family Medicine

## 2019-07-23 ENCOUNTER — Other Ambulatory Visit: Payer: Self-pay | Admitting: Family Medicine

## 2019-07-23 DIAGNOSIS — E119 Type 2 diabetes mellitus without complications: Secondary | ICD-10-CM

## 2019-07-23 NOTE — Telephone Encounter (Signed)
Rx #50 glucometer strips for testing twice a day as directed,  RF 3 DMT2 wo complications

## 2019-08-07 ENCOUNTER — Other Ambulatory Visit: Payer: Self-pay

## 2019-08-07 ENCOUNTER — Ambulatory Visit (INDEPENDENT_AMBULATORY_CARE_PROVIDER_SITE_OTHER): Payer: Medicare Other | Admitting: Family Medicine

## 2019-08-07 ENCOUNTER — Encounter: Payer: Self-pay | Admitting: Family Medicine

## 2019-08-07 VITALS — BP 130/84 | HR 63 | Ht 64.0 in | Wt 264.0 lb

## 2019-08-07 DIAGNOSIS — I10 Essential (primary) hypertension: Secondary | ICD-10-CM | POA: Diagnosis not present

## 2019-08-07 DIAGNOSIS — M48061 Spinal stenosis, lumbar region without neurogenic claudication: Secondary | ICD-10-CM | POA: Diagnosis not present

## 2019-08-07 DIAGNOSIS — I700 Atherosclerosis of aorta: Secondary | ICD-10-CM

## 2019-08-07 DIAGNOSIS — G894 Chronic pain syndrome: Secondary | ICD-10-CM

## 2019-08-07 DIAGNOSIS — L219 Seborrheic dermatitis, unspecified: Secondary | ICD-10-CM

## 2019-08-07 DIAGNOSIS — M47896 Other spondylosis, lumbar region: Secondary | ICD-10-CM

## 2019-08-07 DIAGNOSIS — Z79899 Other long term (current) drug therapy: Secondary | ICD-10-CM

## 2019-08-07 DIAGNOSIS — E1159 Type 2 diabetes mellitus with other circulatory complications: Secondary | ICD-10-CM | POA: Diagnosis not present

## 2019-08-07 DIAGNOSIS — J449 Chronic obstructive pulmonary disease, unspecified: Secondary | ICD-10-CM

## 2019-08-07 DIAGNOSIS — E7800 Pure hypercholesterolemia, unspecified: Secondary | ICD-10-CM

## 2019-08-07 DIAGNOSIS — G2581 Restless legs syndrome: Secondary | ICD-10-CM | POA: Diagnosis not present

## 2019-08-07 DIAGNOSIS — E669 Obesity, unspecified: Secondary | ICD-10-CM | POA: Diagnosis not present

## 2019-08-07 DIAGNOSIS — E66813 Obesity, class 3: Secondary | ICD-10-CM

## 2019-08-07 DIAGNOSIS — M160 Bilateral primary osteoarthritis of hip: Secondary | ICD-10-CM | POA: Diagnosis not present

## 2019-08-07 DIAGNOSIS — G8929 Other chronic pain: Secondary | ICD-10-CM

## 2019-08-07 DIAGNOSIS — I152 Hypertension secondary to endocrine disorders: Secondary | ICD-10-CM

## 2019-08-07 DIAGNOSIS — E1169 Type 2 diabetes mellitus with other specified complication: Secondary | ICD-10-CM | POA: Diagnosis not present

## 2019-08-07 LAB — POCT GLYCOSYLATED HEMOGLOBIN (HGB A1C): HbA1c, POC (controlled diabetic range): 6.1 % (ref 0.0–7.0)

## 2019-08-07 MED ORDER — MORPHINE SULFATE ER 15 MG PO TBCR: 15.0000 mg | EXTENDED_RELEASE_TABLET | Freq: Two times a day (BID) | ORAL | 0 refills | Status: DC

## 2019-08-07 MED ORDER — HYDROCODONE-ACETAMINOPHEN 7.5-325 MG PO TABS: 1.0000 | ORAL_TABLET | Freq: Four times a day (QID) | ORAL | 0 refills | Status: DC | PRN

## 2019-08-07 MED ORDER — MORPHINE SULFATE ER 15 MG PO TBCR: EXTENDED_RELEASE_TABLET | ORAL | 0 refills | Status: DC

## 2019-08-07 MED ORDER — HYDROCODONE-ACETAMINOPHEN 10-325 MG PO TABS: 1.0000 | ORAL_TABLET | Freq: Three times a day (TID) | ORAL | 0 refills | Status: DC | PRN

## 2019-08-07 MED ORDER — KETOCONAZOLE 2 % EX CREA: 1.0000 "application " | TOPICAL_CREAM | Freq: Every day | CUTANEOUS | 0 refills | Status: DC

## 2019-08-07 NOTE — Patient Instructions (Signed)
Seborrheic Dermatitis, Adult Seborrheic dermatitis is a skin disease that causes red, scaly patches. It usually occurs on the scalp, and it is often called dandruff. The patches may appear on other parts of the body. Skin patches tend to appear where there are many oil glands in the skin. Areas of the body that are commonly affected include:  Scalp.  Skin folds of the body.  Ears.  Eyebrows.  Neck.  Face.  Armpits.  The bearded area of men's faces. The condition may come and go for no known reason, and it is often long-lasting (chronic). What are the causes? The cause of this condition is not known. What increases the risk? This condition is more likely to develop in people who:  Have certain conditions, such as: ? HIV (human immunodeficiency virus). ? AIDS (acquired immunodeficiency syndrome). ? Parkinson disease. ? Mood disorders, such as depression.  Are 40-60 years old. What are the signs or symptoms? Symptoms of this condition include:  Thick scales on the scalp.  Redness on the face or in the armpits.  Skin that is flaky. The flakes may be white or yellow.  Skin that seems oily or dry but is not helped with moisturizers.  Itching or burning in the affected areas. How is this diagnosed? This condition is diagnosed with a medical history and physical exam. A sample of your skin may be tested (skin biopsy). You may need to see a skin specialist (dermatologist). How is this treated? There is no cure for this condition, but treatment can help to manage the symptoms. You may get treatment to remove scales, lower the risk of skin infection, and reduce swelling or itching. Treatment may include:  Creams that reduce swelling and irritation (steroids).  Creams that reduce skin yeast.  Medicated shampoo, soaps, moisturizing creams, or ointments.  Medicated moisturizing creams or ointments. Follow these instructions at home:  Apply over-the-counter and prescription  medicines only as told by your health care provider.  Use any medicated shampoo, soaps, skin creams, or ointments only as told by your health care provider.  Keep all follow-up visits as told by your health care provider. This is important. Contact a health care provider if:  Your symptoms do not improve with treatment.  Your symptoms get worse.  You have new symptoms. This information is not intended to replace advice given to you by your health care provider. Make sure you discuss any questions you have with your health care provider. Document Released: 10/23/2005 Document Revised: 10/05/2017 Document Reviewed: 02/10/2016 Elsevier Patient Education  2020 Elsevier Inc.  

## 2019-08-08 LAB — LDL CHOLESTEROL, DIRECT: LDL Direct: 58 mg/dL (ref 0–99)

## 2019-08-11 ENCOUNTER — Encounter: Payer: Self-pay | Admitting: Family Medicine

## 2019-08-11 DIAGNOSIS — L219 Seborrheic dermatitis, unspecified: Secondary | ICD-10-CM | POA: Insufficient documentation

## 2019-08-11 NOTE — Assessment & Plan Note (Signed)
Established problem Controlled Continue current therapy regiment. Rx for MS Contin 3 monthly prescriptions and 3 HC/APAP 5 mg Rxs sent into pharmacy Review of [place] CSRS DB showed no evidence of doctor shopping.

## 2019-08-11 NOTE — Progress Notes (Signed)
Patient ID: [name], female   DOB: 1951-03-03, 68 y.o.   MRN: [id number] Patient ID: [name], female   DOB: 10/18/1951, 68 y.o.   MRN: [id number]  CHRONIC DIABETES New diagnosis on 05/21/19 [facility] ov  Disease Monitoring  Blood Sugar Ranges: not checking at home  Polyuria: no   Visual problems: no   Recent Medication Changes:  no   Medication Regiment Adherence: [name] is not taking any antidiabetes meds  Diet Pattern (Number & Time of meals): erratic; not taking opportunity to choose nutritious options  Recent Physical Illness: no   Emotional Stressors: Relationship moving out of her dgt's home into a new  home with another dgt, unable to work bc [place] and Work .  Level of Activity: sedentary.  Spending large portion day in bed   [place]                          Taking Prescribed Statin: no             Taking Prescribed ACEI/ARB: yes                 Last Eye Exam:              Last Nutrition Consult: none    Chronic Pain Syndrome - locations bilateral upper thorax posteriorly, left buttock, and right ankle - HPI Uses Vicodin as well as Topical balms, which does help  ADE with analgesic therapies: constipation Non-allopathic therapites:none  Pain Inventory Average Pain: 7 Pain Right Now: 7  What TIME of day is your pain at its worst? night Sleep (in general): One Vicodin at bedtime stops her severe restless leg symptoms. Pain is worse with: walking and prolonged sitting Pain improves with: prone position Relief from Meds: 90 (percentage relief)  Mobility walk without assistance: yes use a cane/walker: no ability to climb steps?: yes, but difficult.  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  No changes to psychotropic regiment.  PHQ9 = 13 today (06/26/19) somewhat to very difficult,  with Answer 9 = 0   Morbid Obesity - Weightdown 5 pounds since diagnosis of DMT2 about 1 month ago. Working on making more nutritious choices for sncks   Bipolar 2 disorder - longstanding issu - improved with Acequia therapy back in January but has been unable to have repeat tx bc of pandemic. Marland Kitchen Continues o citalopram, vistaril, lorazepam, and trazodone.  CHRONIC HYPERTENSION  Disease Monitoring  Blood pressure range: not checking at home  Chest pain: no   Dyspnea: no   Claudication: no   Medication compliance: yes  Medication Side Effects  Lightheadedness: no   Urinary frequency: no   Edema: yes, trace at ankles    Preventitive Healthcare:  Exercise: no   Diet Pattern: eating most calories at evening meal and in evening snacks  Salt Restriction: no   Rash  HPI: Location: brows, nose, around mouth         Is it anywhere else?: no Duration: month        Severity: mild Modifying factors: not responding to emollients Timing: constant         Context:  Travel: no     Outdoor activities: no     Other: no  Associated factors:      Itch: mild burning sensation      Painful: no      Oral lesions: no      Impairing social/occupational/personal activites: cosmetic unappealing  Skin or Respiratory allergies: no   New Medications/doses: no  SH:        Occupation: retired  ROS:        Fever: no       Joint pain/swelling: no  Pertinent PMHx:         History of similar skin problems: no        Immunosuppression: diabetes  SH: No smoking  ROS: See HPI  Physical exam Vitals:   08/15/18 1353  BP: (!) 150/68  Pulse: 70  Temp: 98.1 F (36.7 C)  SpO2: 95%   .General; Obese, groomed, distressed intermittently due to left should pain per pt Cor: RRR, no M Lung: BCTA, no acc mm use MSK: left neck with just neck pain with vertical compressions,  nontender in interscap area, neg impingement.   Neuro: intact bicep and tricap DTR bil symm; 5/5 grip/elbow, shoulder strength.  Normal gait.  Psych: euthymic/normal speech/ concrete language/ goal directed thoughts  Diabetic Foot Exam - Simple   Simple Foot Form Diabetic Foot exam was performed with the following findings: Yes 08/07/2019 10:19 AM  Visual Inspection No deformities, no ulcerations, no other skin breakdown bilaterally: Yes Sensation Testing Intact to touch and monofilament testing bilaterally: Yes Pulse Check Posterior Tibialis and Dorsalis pulse intact bilaterally: Yes Comments    Skin: Erythema patch across brows/glabella, nasolabial folds bilaterally symmetric, shiny surface.  No posterior auricular rashes

## 2019-08-11 NOTE — Assessment & Plan Note (Signed)
Established problem Controlled Continue current therapy regiment. Rx for MS Contin 3 monthly prescriptions and 3 HC/APAP 5 mg Rxs sent into pharmacy Review of [place] CSRS DB showed no evidence of doctor shopping.

## 2019-08-11 NOTE — Assessment & Plan Note (Signed)
New diagnosis Topical antifungal shampoos - Rx for Nizoral 2% shampoo.  Also recommended use of shampoos Head N Shoulders or Selsun Blue.

## 2019-08-11 NOTE — Assessment & Plan Note (Signed)
Established problem Controlled Continue current therapy regiment. Rx for MS Contin 3 monthly prescriptions and 3 HC/APAP 5 mg Rxs sent into pharmacy Review of [place] CSRS DB showed no evidence of doctor shopping.

## 2019-08-13 ENCOUNTER — Encounter: Payer: Self-pay | Admitting: Registered"

## 2019-08-13 ENCOUNTER — Encounter: Payer: Medicare Other | Attending: Family Medicine | Admitting: Registered"

## 2019-08-13 ENCOUNTER — Other Ambulatory Visit: Payer: Self-pay

## 2019-08-13 DIAGNOSIS — E785 Hyperlipidemia, unspecified: Secondary | ICD-10-CM | POA: Diagnosis not present

## 2019-08-13 DIAGNOSIS — I10 Essential (primary) hypertension: Secondary | ICD-10-CM | POA: Diagnosis not present

## 2019-08-13 DIAGNOSIS — E669 Obesity, unspecified: Secondary | ICD-10-CM

## 2019-08-13 DIAGNOSIS — E1169 Type 2 diabetes mellitus with other specified complication: Secondary | ICD-10-CM

## 2019-08-13 DIAGNOSIS — E0921 Drug or chemical induced diabetes mellitus with diabetic nephropathy: Secondary | ICD-10-CM | POA: Diagnosis not present

## 2019-08-13 DIAGNOSIS — Z8249 Family history of ischemic heart disease and other diseases of the circulatory system: Secondary | ICD-10-CM | POA: Diagnosis not present

## 2019-08-13 DIAGNOSIS — E119 Type 2 diabetes mellitus without complications: Secondary | ICD-10-CM | POA: Diagnosis not present

## 2019-08-13 NOTE — Patient Instructions (Signed)
For your low blood sugar concerns:  Until you get a meter that works, go ahead and treat your low blood sugar symptoms with 15 grams carbohydrates (Your Tang should work, don't add anything else to it; 1/4 c juice or regular soda, 3 glucose tablets - you can buy them at the drug store)  Since you are going about 6 hours in between meals, consider having a snack or small meals in between your 2 meals.

## 2019-08-13 NOTE — Progress Notes (Signed)
Diabetes Self-Management Education  Visit Type: Follow-up  Appt. Start Time: 1400 Appt. End Time: 1430  08/13/2019  [name], identified by name and date of birth, is a 68 y.o. female with a diagnosis of Diabetes: Type 2.   ASSESSMENT  There were no vitals taken for this visit. There is no height or weight on file to calculate BMI.   Pt states her A1c is 6.1% down from 6.5% in July. Pt states the main change she has made is she is more conscious of sweets she is consuming.   Pt states when she was younger she lost weight by just eating 1 meal per day and fasting 1 day per week. Pt states she experienced weight cycling. RD did not discuss this visit d/t more urgent concern that patient might be having hypoglycemic episodes.   Pt described several occassions in the last week of symptoms consistent with hypoglycemia. Pt states she cannot get meter to work so she doesn't know what her BG was. Pt has a hard time navigating stairs to her bedroom and may be a fall risk if she is having hypoglycemia. Spent most of visit discussing identifying and treating low blood sugar.   Pt states she is getting more sleep going to bed now before 1 am. Today pt states she woke up 7 am to take thyroid med, at 8 am took other medicines and then went back to sleep, got up at 10 am. Pt states she takes a lot of sedatives but not making her sleep as much as she would expect.  Physical Activity: only time gets movement is when she goes out for errands or doctor's appointments. Mostly stays in her bedroom. Pt states she is still on schedule to move end of month and will not have to deal with stairs. Pt also states that she will be living with her daughter who has been trained as a CNA and understands her bi-polar state better than her other daughter and feels this will be a positive change for her.  Pt sates she has joint pain from Lipitor and although her LDL is 58 pt reports her MD wants her to continue with  Lipitor.   Diabetes Self-Management Education - 08/13/19 1407      Visit Information   Visit Type  Follow-up      Initial Visit   Diabetes Type  Type 2      Complications   Last HgB A1C per patient/outside source  6.1 %   per pt   How often do you check your blood sugar?  0 times/day (not testing)    Number of hypoglycemic episodes per month  3   didn't check BG, had symptoms     Dietary Intake   Lunch  (1:30) bacon, egg, cheese biscuit (on the way to this appt)    Dinner  (5:30 - 8 pm) 2 boneless porkchops, canned turnip greens, beans, mashed potatoes OR hormel complete meals    Snack (evening)  none OR popcorn    Beverage(s)  water, 32 oz coke zero, tang      Exercise   Exercise Type  ADL's      Patient Education   Acute complications  Taught treatment of hypoglycemia - the 15 rule.      Individualized Goals (developed by patient)   Reducing Risk  treat hypoglycemia with 15 grams of carbs if blood glucose less than 70mg /dL      Patient Self-Evaluation of Goals - Patient rates self  as meeting previously set goals (% of time)   Nutrition  50 - 75 %    Monitoring  < 25%   still having issues with meter     Outcomes   Expected Outcomes  Demonstrated interest in learning. Expect positive outcomes    Future DMSE  PRN    Program Status  Completed      Subsequent Visit   Since your last visit have you continued or begun to take your medications as prescribed?  Not on Medications    Since your last visit have you had your blood pressure checked?  Yes    Is your most recent blood pressure lower, unchanged, or higher since your last visit?  Unchanged    Since your last visit have you experienced any weight changes?  Gain    Weight Gain (lbs)  2    Since your last visit, are you checking your blood glucose at least once a day?  No   meter doesn't work      Individualized Plan for Diabetes Self-Management Training:   Learning Objective:  Patient will have a greater  understanding of diabetes self-management. Patient education plan is to attend individual and/or group sessions per assessed needs and concerns.   Patient Instructions  For your low blood sugar concerns:  Until you get a meter that works, go ahead and treat your low blood sugar symptoms with 15 grams carbohydrates (Your Tang should work, don't add anything else to it; 1/4 c juice or regular soda, 3 glucose tablets - you can buy them at the drug store)  Since you are going about 6 hours in between meals, consider having a snack or small meals in between your 2 meals.    Expected Outcomes:  Demonstrated interest in learning. Expect positive outcomes  Education material provided: Low Blood Sugar  If problems or questions, patient to contact team via:  Phone and MyChart  Future DSME appointment: PRN

## 2019-08-19 DIAGNOSIS — Z8249 Family history of ischemic heart disease and other diseases of the circulatory system: Secondary | ICD-10-CM | POA: Diagnosis not present

## 2019-08-19 DIAGNOSIS — E785 Hyperlipidemia, unspecified: Secondary | ICD-10-CM | POA: Diagnosis not present

## 2019-08-19 DIAGNOSIS — I10 Essential (primary) hypertension: Secondary | ICD-10-CM | POA: Diagnosis not present

## 2019-08-19 DIAGNOSIS — E0921 Drug or chemical induced diabetes mellitus with diabetic nephropathy: Secondary | ICD-10-CM | POA: Diagnosis not present

## 2019-08-21 ENCOUNTER — Other Ambulatory Visit: Payer: Self-pay | Admitting: Family Medicine

## 2019-08-21 DIAGNOSIS — E039 Hypothyroidism, unspecified: Secondary | ICD-10-CM

## 2019-09-10 ENCOUNTER — Encounter: Payer: Self-pay | Admitting: Family Medicine

## 2019-09-11 ENCOUNTER — Encounter: Payer: Self-pay | Admitting: Family Medicine

## 2019-09-11 NOTE — Progress Notes (Signed)
Letter for patient: "It is my medical opinion that [name] should not return to work because of her high risk of for severe CoViD-19 illness."

## 2019-09-12 ENCOUNTER — Encounter: Payer: Self-pay | Admitting: Family Medicine

## 2019-09-14 ENCOUNTER — Other Ambulatory Visit: Payer: Self-pay | Admitting: Family Medicine

## 2019-09-14 DIAGNOSIS — K2100 Gastro-esophageal reflux disease with esophagitis, without bleeding: Secondary | ICD-10-CM

## 2019-09-14 DIAGNOSIS — I10 Essential (primary) hypertension: Secondary | ICD-10-CM

## 2019-09-17 DIAGNOSIS — I10 Essential (primary) hypertension: Secondary | ICD-10-CM | POA: Diagnosis not present

## 2019-10-19 ENCOUNTER — Encounter: Payer: Self-pay | Admitting: Family Medicine

## 2019-10-20 ENCOUNTER — Encounter: Payer: Self-pay | Admitting: Family Medicine

## 2019-10-26 ENCOUNTER — Other Ambulatory Visit: Payer: Self-pay | Admitting: Family Medicine

## 2019-10-26 DIAGNOSIS — I10 Essential (primary) hypertension: Secondary | ICD-10-CM

## 2019-11-12 DIAGNOSIS — F3181 Bipolar II disorder: Secondary | ICD-10-CM | POA: Diagnosis not present

## 2019-11-13 ENCOUNTER — Encounter: Payer: Self-pay | Admitting: Family Medicine

## 2019-11-13 ENCOUNTER — Other Ambulatory Visit: Payer: Self-pay

## 2019-11-13 ENCOUNTER — Telehealth (INDEPENDENT_AMBULATORY_CARE_PROVIDER_SITE_OTHER): Payer: PPO | Admitting: Family Medicine

## 2019-11-13 DIAGNOSIS — G2581 Restless legs syndrome: Secondary | ICD-10-CM

## 2019-11-13 DIAGNOSIS — E119 Type 2 diabetes mellitus without complications: Secondary | ICD-10-CM

## 2019-11-13 DIAGNOSIS — J449 Chronic obstructive pulmonary disease, unspecified: Secondary | ICD-10-CM

## 2019-11-13 DIAGNOSIS — M48061 Spinal stenosis, lumbar region without neurogenic claudication: Secondary | ICD-10-CM

## 2019-11-13 DIAGNOSIS — M47896 Other spondylosis, lumbar region: Secondary | ICD-10-CM

## 2019-11-13 DIAGNOSIS — G894 Chronic pain syndrome: Secondary | ICD-10-CM

## 2019-11-13 DIAGNOSIS — M160 Bilateral primary osteoarthritis of hip: Secondary | ICD-10-CM

## 2019-11-13 MED ORDER — HYDROCODONE-ACETAMINOPHEN 10-325 MG PO TABS: 1.0000 | ORAL_TABLET | Freq: Three times a day (TID) | ORAL | 0 refills | Status: DC | PRN

## 2019-11-13 MED ORDER — MORPHINE SULFATE ER 15 MG PO TBCR: 15.0000 mg | EXTENDED_RELEASE_TABLET | Freq: Two times a day (BID) | ORAL | 0 refills | Status: DC

## 2019-11-13 MED ORDER — FREESTYLE LIBRE 14 DAY SENSOR MISC: 1.0000 | 99 refills | Status: DC

## 2019-11-13 MED ORDER — HYDROCODONE-ACETAMINOPHEN 10-325 MG PO TABS: 1.0000 | ORAL_TABLET | Freq: Four times a day (QID) | ORAL | 0 refills | Status: DC | PRN

## 2019-11-13 NOTE — Assessment & Plan Note (Signed)
Established problem Controlled Continue current lifestyle therapy regiment.

## 2019-11-13 NOTE — Assessment & Plan Note (Signed)
Highest risk serious complications from Should develop CoViD infection. Recommend vaccination covid once phase 1b rollout

## 2019-11-13 NOTE — Assessment & Plan Note (Signed)
Established problem Controlled Continue current therapy regiment. Rx for MS Contin 2 monthly prescriptions and 2 HC/APAP 5 mg Rxs sent into pharmacy Review of [place] CSRS DB showed no evidence of doctor shopping.

## 2019-11-13 NOTE — Assessment & Plan Note (Signed)
New problem No further work-up planned for now  Working Dx: Mild viral bronchitis  Symptomatic care. Monitor for worsening.

## 2019-11-13 NOTE — Progress Notes (Signed)
[place] visit via Doximetry / E- Visit  Ms Outman is well know to me.  This visit type was conducted due to national recommendations for restrictions regarding the COVID-19 Pandemic (e.g. social distancing) in an effort to limit this patient's exposure and mitigate transmission in our community.   Due to their co-morbid illnesses, this patient is at least at moderate risk for complications without adequate follow up: yes   This format is felt to be most appropriate for this patient at this time.  All issues noted in this document were discussed and addressed.  A limited physical exam was performed with this format.   Patient's identity was confirmed using her Full Name and facial recognition    Encounter participants: Patient: [name]  Patient Location: Other:  Hairstylist Provider: Sherren Mocha An Schnabel at office   =======================================================================================================================================================================================  Chief Complaint:  1) DMT2 2) Chronic Pain Syndrome HPI:  1) DMT2 - Dx 05/2019 - [place] with diet and exercise alone - Freestyle Libre glucometer reading 80 to 180 range.   2) Chronic Pain Syndrome - Longtstanding - Tolerating MS Contin and Percocet 10/325 - Able to complete ADLs and participate in iADLs with pain medication - origins of pain lumbar OA, Hip OA, chronic pain sensitization  3) Cough - onset last week - stable - productive white supurum - (+) rhinoorhea and PND - No SHOB - No fever, No known COVID exposures.  No myalgias/fatigue   Exam:  Respiratory: speaking in full sentence, no audible wheeze, no visible increase WOB  Assessment/Plan:  No problem-specific Assessment & Plan notes found for this encounter.    Follow Up Instructions:     We discussed the assessment and treatment plan with the patient. The patient was provided an  opportunity to ask questions and all were answered. The patient agreed with the plan and demonstrated an understanding of the instructions.   The patient was advised to call back or seek an in-person evaluation if the symptoms worsen or if the condition fails to improve as anticipated.  COVID-19 Education: The signs and symptoms of COVID-19 were discussed with the patient and how to seek care for testing (follow up with PCP or arrange E-visit).  The importance of social distancing was discussed today.  CPT E&M Office Visit Time Before Visit; reviewing medical records (e.g. recent visits, labs, studies): 5 minutes During Visit (F2F time): 12 minutes After Visit (discussion with family or HCP, prescribing, ordering, referring, calling result/recommendations or documenting on same day): 10 minutes Total Visit Time: 27minutes

## 2019-11-22 ENCOUNTER — Other Ambulatory Visit: Payer: Self-pay | Admitting: Family Medicine

## 2019-11-24 ENCOUNTER — Other Ambulatory Visit: Payer: Self-pay | Admitting: Family Medicine

## 2019-11-26 ENCOUNTER — Encounter: Payer: Self-pay | Admitting: Family Medicine

## 2019-11-28 ENCOUNTER — Other Ambulatory Visit: Payer: Self-pay | Admitting: *Deleted

## 2019-11-28 DIAGNOSIS — K2100 Gastro-esophageal reflux disease with esophagitis, without bleeding: Secondary | ICD-10-CM

## 2019-12-01 MED ORDER — OMEPRAZOLE 40 MG PO CPDR: 40.0000 mg | DELAYED_RELEASE_CAPSULE | Freq: Every day | ORAL | 3 refills | Status: DC

## 2019-12-02 ENCOUNTER — Telehealth: Payer: Self-pay

## 2019-12-02 NOTE — Telephone Encounter (Signed)
Pt calls nurse line with concerns for pain medication being sent to the wrong pharmacy. Patient needs pain medication rx switched to [facility] [address] in [place]. I have updated new pharmacy in demographics, however, future pain medicine has already been pended for previous pharmacy.   Please resend orders to new pharmacy. Thanks  To PCP  Talbot Grumbling, RN

## 2019-12-09 ENCOUNTER — Telehealth: Payer: Self-pay

## 2019-12-09 DIAGNOSIS — M160 Bilateral primary osteoarthritis of hip: Secondary | ICD-10-CM

## 2019-12-09 DIAGNOSIS — G894 Chronic pain syndrome: Secondary | ICD-10-CM

## 2019-12-09 DIAGNOSIS — M48061 Spinal stenosis, lumbar region without neurogenic claudication: Secondary | ICD-10-CM

## 2019-12-09 DIAGNOSIS — M47896 Other spondylosis, lumbar region: Secondary | ICD-10-CM

## 2019-12-09 NOTE — Telephone Encounter (Signed)
Patient calls nurse line stating her Hydrocodone RX is ready for pick up as of 2/4. However, looks like the MS Contin Rx for 2/4 was set to "print." Please resend to [facility] [address].

## 2019-12-11 MED ORDER — MORPHINE SULFATE ER 15 MG PO TBCR: 15.0000 mg | EXTENDED_RELEASE_TABLET | Freq: Two times a day (BID) | ORAL | 0 refills | Status: DC

## 2019-12-21 ENCOUNTER — Telehealth: Payer: Self-pay | Admitting: Family Medicine

## 2019-12-21 NOTE — Telephone Encounter (Addendum)
Patient called the after-hours emergency line to state that her "back went out" yesterday.  She has a history of back pain with sciatica.  She states she was recently sitting in her car for longer than usual she thinks may contribute to it.  She already takes hydrocodone and MS Contin, as well as tizanidine.  She started using Salonpas and Biofreeze topical treatments after the pain worsened.  The patient is wondering if there is anything else she can do to help alleviate her pain.  She has swelling when she takes gabapentin and cannot tolerate Requip for restless leg.  Pain does not radiate down the leg.  She states the sciatic pain is localized to her right buttock.  I advised the patient to continue ambulating, but avoid strenuous exercise or movements.  I advised her to use ice or heat topically to the area.  Informed her of red flag symptoms such as incontinence or saddle anesthesia and to come to the ED if she experiences any of those issues.  I offered her a appointment with our access to care clinic tomorrow, but patient would like to see her PCP, Dr. Arther Dames.  He has an availability on February 25.  The patient was agreeable to this time an appointment was made.  Will forward to PCP, Dr. Arther Dames.

## 2020-01-01 ENCOUNTER — Encounter: Payer: Self-pay | Admitting: Family Medicine

## 2020-01-01 ENCOUNTER — Other Ambulatory Visit: Payer: Self-pay

## 2020-01-01 ENCOUNTER — Ambulatory Visit (INDEPENDENT_AMBULATORY_CARE_PROVIDER_SITE_OTHER): Payer: PPO | Admitting: Family Medicine

## 2020-01-01 VITALS — BP 132/70 | HR 60 | Wt 273.0 lb

## 2020-01-01 DIAGNOSIS — E1159 Type 2 diabetes mellitus with other circulatory complications: Secondary | ICD-10-CM

## 2020-01-01 DIAGNOSIS — M797 Fibromyalgia: Secondary | ICD-10-CM | POA: Diagnosis not present

## 2020-01-01 DIAGNOSIS — K2100 Gastro-esophageal reflux disease with esophagitis, without bleeding: Secondary | ICD-10-CM | POA: Diagnosis not present

## 2020-01-01 DIAGNOSIS — M47816 Spondylosis without myelopathy or radiculopathy, lumbar region: Secondary | ICD-10-CM | POA: Diagnosis not present

## 2020-01-01 DIAGNOSIS — E669 Obesity, unspecified: Secondary | ICD-10-CM

## 2020-01-01 DIAGNOSIS — M545 Low back pain, unspecified: Secondary | ICD-10-CM

## 2020-01-01 DIAGNOSIS — E1169 Type 2 diabetes mellitus with other specified complication: Secondary | ICD-10-CM | POA: Diagnosis not present

## 2020-01-01 DIAGNOSIS — I152 Hypertension secondary to endocrine disorders: Secondary | ICD-10-CM

## 2020-01-01 DIAGNOSIS — K219 Gastro-esophageal reflux disease without esophagitis: Secondary | ICD-10-CM | POA: Diagnosis not present

## 2020-01-01 DIAGNOSIS — E119 Type 2 diabetes mellitus without complications: Secondary | ICD-10-CM | POA: Diagnosis not present

## 2020-01-01 DIAGNOSIS — M62838 Other muscle spasm: Secondary | ICD-10-CM | POA: Diagnosis not present

## 2020-01-01 DIAGNOSIS — I10 Essential (primary) hypertension: Secondary | ICD-10-CM | POA: Diagnosis not present

## 2020-01-01 LAB — POCT GLYCOSYLATED HEMOGLOBIN (HGB A1C): HbA1c, POC (controlled diabetic range): 6.4 % (ref 0.0–7.0)

## 2020-01-01 MED ORDER — TIZANIDINE HCL 2 MG PO TABS: 2.0000 mg | ORAL_TABLET | Freq: Every day | ORAL | 2 refills | Status: DC

## 2020-01-01 MED ORDER — OMEPRAZOLE 40 MG PO CPDR: 40.0000 mg | DELAYED_RELEASE_CAPSULE | Freq: Every day | ORAL | 3 refills | Status: DC

## 2020-01-01 MED ORDER — HYDROCHLOROTHIAZIDE 25 MG PO TABS: 25.0000 mg | ORAL_TABLET | Freq: Every day | ORAL | 3 refills | Status: DC

## 2020-01-01 NOTE — Patient Instructions (Signed)
I believe pain is muscle spasm and will need physical therapy to release the muscles.

## 2020-01-05 ENCOUNTER — Encounter: Payer: Self-pay | Admitting: Family Medicine

## 2020-01-05 DIAGNOSIS — M62838 Other muscle spasm: Secondary | ICD-10-CM | POA: Insufficient documentation

## 2020-01-05 NOTE — Assessment & Plan Note (Signed)
Established problem Controlled Continue current therapy regiment. Recommend trial of reduced dose PPI next ov

## 2020-01-05 NOTE — Assessment & Plan Note (Signed)
Established problem Controlled per [facility] report/chart Ms [name] is learning the effects on CGBs of various foods.  Continue current lifestyle and monitoring therapy regiment.

## 2020-01-05 NOTE — Assessment & Plan Note (Signed)
New problem Secondary to acute on chronic nonspecific lbp Rec: PT, Rx Tizanidine

## 2020-01-05 NOTE — Assessment & Plan Note (Signed)
Established problem worsened.  Bilateral tenderness and pain in lower back and buttocks Without symptoms of radiculopathy Working explanation of nonspecific musculoskeletal pain with muscle spasm   Recommend: Referral to Phsycial Therapy for exercise, mobilization and soft tissue release.

## 2020-01-20 ENCOUNTER — Ambulatory Visit: Payer: Federal, State, Local not specified - PPO | Admitting: Physical Therapy

## 2020-02-03 ENCOUNTER — Encounter: Payer: Self-pay | Admitting: Family Medicine

## 2020-02-03 ENCOUNTER — Other Ambulatory Visit: Payer: Self-pay | Admitting: Family Medicine

## 2020-02-03 DIAGNOSIS — M47896 Other spondylosis, lumbar region: Secondary | ICD-10-CM

## 2020-02-03 DIAGNOSIS — G894 Chronic pain syndrome: Secondary | ICD-10-CM

## 2020-02-03 DIAGNOSIS — L219 Seborrheic dermatitis, unspecified: Secondary | ICD-10-CM

## 2020-02-03 DIAGNOSIS — M160 Bilateral primary osteoarthritis of hip: Secondary | ICD-10-CM

## 2020-02-03 DIAGNOSIS — M48061 Spinal stenosis, lumbar region without neurogenic claudication: Secondary | ICD-10-CM

## 2020-02-03 DIAGNOSIS — G2581 Restless legs syndrome: Secondary | ICD-10-CM

## 2020-02-06 ENCOUNTER — Other Ambulatory Visit: Payer: Self-pay | Admitting: Family Medicine

## 2020-02-06 DIAGNOSIS — G2581 Restless legs syndrome: Secondary | ICD-10-CM

## 2020-02-06 DIAGNOSIS — M47896 Other spondylosis, lumbar region: Secondary | ICD-10-CM

## 2020-02-06 DIAGNOSIS — G894 Chronic pain syndrome: Secondary | ICD-10-CM

## 2020-02-06 DIAGNOSIS — M160 Bilateral primary osteoarthritis of hip: Secondary | ICD-10-CM

## 2020-02-06 DIAGNOSIS — M48061 Spinal stenosis, lumbar region without neurogenic claudication: Secondary | ICD-10-CM

## 2020-02-06 MED ORDER — HYDROCODONE-ACETAMINOPHEN 10-325 MG PO TABS: 1.0000 | ORAL_TABLET | Freq: Four times a day (QID) | ORAL | 0 refills | Status: DC | PRN

## 2020-02-06 MED ORDER — MORPHINE SULFATE ER 15 MG PO TBCR: 15.0000 mg | EXTENDED_RELEASE_TABLET | Freq: Two times a day (BID) | ORAL | 0 refills | Status: DC

## 2020-02-10 ENCOUNTER — Other Ambulatory Visit: Payer: Self-pay

## 2020-02-10 DIAGNOSIS — E039 Hypothyroidism, unspecified: Secondary | ICD-10-CM

## 2020-02-10 MED ORDER — LEVOTHYROXINE SODIUM 50 MCG PO TABS: ORAL_TABLET | ORAL | 3 refills | Status: DC

## 2020-02-10 MED ORDER — KETOCONAZOLE 2 % EX CREA: 1.0000 "application " | TOPICAL_CREAM | Freq: Every day | CUTANEOUS | 0 refills | Status: DC

## 2020-02-11 ENCOUNTER — Other Ambulatory Visit: Payer: Self-pay | Admitting: *Deleted

## 2020-02-11 DIAGNOSIS — I10 Essential (primary) hypertension: Secondary | ICD-10-CM

## 2020-02-11 DIAGNOSIS — E1169 Type 2 diabetes mellitus with other specified complication: Secondary | ICD-10-CM

## 2020-02-11 MED ORDER — RAMIPRIL 10 MG PO CAPS: 10.0000 mg | ORAL_CAPSULE | Freq: Every day | ORAL | 3 refills | Status: DC

## 2020-02-11 MED ORDER — HYDROCHLOROTHIAZIDE 25 MG PO TABS: 25.0000 mg | ORAL_TABLET | Freq: Every day | ORAL | 3 refills | Status: DC

## 2020-02-11 NOTE — Telephone Encounter (Signed)
Patient is transferring her medications from mail order to local pharmacy.  Needs scripts sent in for HCTZ and Ramipril.  Heather Streeper,CMA

## 2020-03-03 ENCOUNTER — Encounter: Payer: Self-pay | Admitting: Family Medicine

## 2020-03-03 DIAGNOSIS — G894 Chronic pain syndrome: Secondary | ICD-10-CM

## 2020-03-03 DIAGNOSIS — M48061 Spinal stenosis, lumbar region without neurogenic claudication: Secondary | ICD-10-CM

## 2020-03-03 DIAGNOSIS — G2581 Restless legs syndrome: Secondary | ICD-10-CM

## 2020-03-03 DIAGNOSIS — M47896 Other spondylosis, lumbar region: Secondary | ICD-10-CM

## 2020-03-03 DIAGNOSIS — M160 Bilateral primary osteoarthritis of hip: Secondary | ICD-10-CM

## 2020-03-03 MED ORDER — HYDROCODONE-ACETAMINOPHEN 10-325 MG PO TABS: 1.0000 | ORAL_TABLET | Freq: Four times a day (QID) | ORAL | 0 refills | Status: DC | PRN

## 2020-03-03 MED ORDER — MORPHINE SULFATE ER 15 MG PO TBCR: 15.0000 mg | EXTENDED_RELEASE_TABLET | Freq: Two times a day (BID) | ORAL | 0 refills | Status: DC

## 2020-03-03 NOTE — Telephone Encounter (Signed)
Will forward to MD. [name],CMA  

## 2020-03-08 ENCOUNTER — Other Ambulatory Visit: Payer: Self-pay | Admitting: Family Medicine

## 2020-03-08 DIAGNOSIS — Z1231 Encounter for screening mammogram for malignant neoplasm of breast: Secondary | ICD-10-CM

## 2020-03-12 ENCOUNTER — Other Ambulatory Visit: Payer: Self-pay | Admitting: Family Medicine

## 2020-03-14 ENCOUNTER — Encounter: Payer: Self-pay | Admitting: Family Medicine

## 2020-03-14 DIAGNOSIS — E876 Hypokalemia: Secondary | ICD-10-CM

## 2020-03-15 MED ORDER — POTASSIUM CHLORIDE CRYS ER 10 MEQ PO TBCR: 40.0000 meq | EXTENDED_RELEASE_TABLET | Freq: Every day | ORAL | 3 refills | Status: DC

## 2020-03-16 ENCOUNTER — Ambulatory Visit
Admission: RE | Admit: 2020-03-16 | Discharge: 2020-03-16 | Disposition: A | Discharge status: Home / Self Care | Payer: PPO | Source: Ambulatory Visit | Attending: Family Medicine | Admitting: Family Medicine

## 2020-03-16 ENCOUNTER — Other Ambulatory Visit: Payer: Self-pay

## 2020-03-16 DIAGNOSIS — Z1231 Encounter for screening mammogram for malignant neoplasm of breast: Secondary | ICD-10-CM | POA: Diagnosis not present

## 2020-03-17 ENCOUNTER — Other Ambulatory Visit (HOSPITAL_COMMUNITY)
Admission: RE | Admit: 2020-03-17 | Discharge: 2020-03-17 | Disposition: A | Discharge status: Home / Self Care | Payer: PPO | Source: Ambulatory Visit | Attending: Family Medicine | Admitting: Family Medicine

## 2020-03-17 ENCOUNTER — Other Ambulatory Visit: Payer: Self-pay

## 2020-03-17 ENCOUNTER — Ambulatory Visit (INDEPENDENT_AMBULATORY_CARE_PROVIDER_SITE_OTHER): Payer: PPO | Admitting: Family Medicine

## 2020-03-17 VITALS — BP 124/78 | HR 80 | Ht 64.0 in | Wt 280.0 lb

## 2020-03-17 DIAGNOSIS — Z124 Encounter for screening for malignant neoplasm of cervix: Secondary | ICD-10-CM | POA: Insufficient documentation

## 2020-03-17 DIAGNOSIS — Z90710 Acquired absence of both cervix and uterus: Secondary | ICD-10-CM | POA: Diagnosis not present

## 2020-03-17 DIAGNOSIS — Z1151 Encounter for screening for human papillomavirus (HPV): Secondary | ICD-10-CM | POA: Diagnosis not present

## 2020-03-17 NOTE — Patient Instructions (Signed)
Thank you for coming to see me today. It was a pleasure! Today we talked about:   We completed your Pap smear today.  I will release any results on MyChart and if you have anything abnormal I will give you a phone call.  Please follow-up with Dr. McDiarmid or sooner as needed.  If you have any questions or concerns, please do not hesitate to call the office at [phone].  Take Care,   Gabriella Tonette Koehne, DO  Preventive Care 70 Years and Older, Female Preventive care refers to lifestyle choices and visits with your health care provider that can promote health and wellness. This includes:  A yearly physical exam. This is also called an annual well check.  Regular dental and eye exams.  Immunizations.  Screening for certain conditions.  Healthy lifestyle choices, such as diet and exercise. What can I expect for my preventive care visit? Physical exam Your health care provider will check:  Height and weight. These may be used to calculate body mass index (BMI), which is a measurement that tells if you are at a healthy weight.  Heart rate and blood pressure.  Your skin for abnormal spots. Counseling Your health care provider may ask you questions about:  Alcohol, tobacco, and drug use.  Emotional well-being.  Home and relationship well-being.  Sexual activity.  Eating habits.  History of falls.  Memory and ability to understand (cognition).  Work and work Statistician.  Pregnancy and menstrual history. What immunizations do I need?  Influenza (flu) vaccine  This is recommended every year. Tetanus, diphtheria, and pertussis (Tdap) vaccine  You may need a Td booster every 10 years. Varicella (chickenpox) vaccine  You may need this vaccine if you have not already been vaccinated. Zoster (shingles) vaccine  You may need this after age 13. Pneumococcal conjugate (PCV13) vaccine  One dose is recommended after age 80. Pneumococcal polysaccharide (PPSV23)  vaccine  One dose is recommended after age 41. Measles, mumps, and rubella (MMR) vaccine  You may need at least one dose of MMR if you were born in 1957 or later. You may also need a second dose. Meningococcal conjugate (MenACWY) vaccine  You may need this if you have certain conditions. Hepatitis A vaccine  You may need this if you have certain conditions or if you travel or work in places where you may be exposed to hepatitis A. Hepatitis B vaccine  You may need this if you have certain conditions or if you travel or work in places where you may be exposed to hepatitis B. Haemophilus influenzae type b (Hib) vaccine  You may need this if you have certain conditions. You may receive vaccines as individual doses or as more than one vaccine together in one shot (combination vaccines). Talk with your health care provider about the risks and benefits of combination vaccines. What tests do I need? Blood tests  Lipid and cholesterol levels. These may be checked every 5 years, or more frequently depending on your overall health.  Hepatitis C test.  Hepatitis B test. Screening  Lung cancer screening. You may have this screening every year starting at age 68 if you have a 30-pack-year history of smoking and currently smoke or have quit within the past 15 years.  Colorectal cancer screening. All adults should have this screening starting at age 2 and continuing until age 67. Your health care provider may recommend screening at age 13 if you are at increased risk. You will have tests every 1-10 years,  years, depending on your results and the type of screening test.  Diabetes screening. This is done by checking your blood sugar (glucose) after you have not eaten for a while (fasting). You may have this done every 1-3 years.  Mammogram. This may be done every 1-2 years. Talk with your health care provider about how often you should have regular mammograms.  BRCA-related cancer screening. This may  be done if you have a family history of breast, ovarian, tubal, or peritoneal cancers. Other tests  Sexually transmitted disease (STD) testing.  Bone density scan. This is done to screen for osteoporosis. You may have this done starting at age 70. Follow these instructions at home: Eating and drinking  Eat a diet that includes fresh fruits and vegetables, whole grains, lean protein, and low-fat dairy products. Limit your intake of foods with high amounts of sugar, saturated fats, and salt.  Take vitamin and mineral supplements as recommended by your health care provider.  Do not drink alcohol if your health care provider tells you not to drink.  If you drink alcohol: ? Limit how much you have to 0-1 drink a day. ? Be aware of how much alcohol is in your drink. In the [place] one drink equals one 12 oz bottle of beer (355 mL), one 5 oz glass of wine (148 mL), or one 1 oz glass of hard liquor (44 mL). Lifestyle  Take daily care of your teeth and gums.  Stay active. Exercise for at least 30 minutes on 5 or more days each week.  Do not use any products that contain nicotine or tobacco, such as cigarettes, e-cigarettes, and chewing tobacco. If you need help quitting, ask your health care provider.  If you are sexually active, practice safe sex. Use a condom or other form of protection in order to prevent STIs (sexually transmitted infections).  Talk with your health care provider about taking a low-dose aspirin or statin. What's next?  Go to your health care provider once a year for a well check visit.  Ask your health care provider how often you should have your eyes and teeth checked.  Stay up to date on all vaccines. This information is not intended to replace advice given to you by your health care provider. Make sure you discuss any questions you have with your health care provider. Document Revised: 10/17/2018 Document Reviewed: 10/17/2018 [employer]vier Patient Education  2020  [employer]  

## 2020-03-17 NOTE — Progress Notes (Signed)
Subjective:     [name] is a 69 y.o. female here for a routine exam.  No LMP recorded. Patient has had a hysterectomy. Patient reports that she has not had a Pap smear in greater than 10 years and would like to have one as she knows that she is well overdue. Patient is aware that we usually stop screening for Pap smears at age 72 but given that she is behind on her screening she would like to have her likely last one done today.  Current acute medical issues: None   Recent Gynecologic History No LMP recorded. Patient has had a hysterectomy. Last Pap: Patient unsure but denies having any abnormal Paps in the past Last mammogram: May 2021,  normal  Past Medical History:  Diagnosis Date  . Adrenal nodule ([place]) 04/08/2011   04/08/11 Abdominal CT showed Left adrenal nodule which is tachnically indeterminate but most likely an adenoma.  It is 1.7 cm and 42 HU on portal venous phase image. 31 HU on delayed images.   01/09/2012 Abdominal CT w/ & w/o CM: Stable benign left adrenal adenoma. No further specific follow-up is needed for this finding.    . Allergic rhinitis 01/03/2007   Qualifier: Diagnosis of  By: Drucie Ip    . Allergy   . Anemia    hx of  in 20's  . Anxiety   . Bipolar disorder ([place])   . Carpal tunnel syndrome 02/14/2007   S/P carpel tunnel release bilaterally.    . Cataracts, bilateral    removed both eyes  2017-2018  . Chronic pain syndrome 12/24/2008  . 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 COPD WITHOUT EXACERBATION 01/03/2007   Qualifier: Diagnosis of  By: Drucie Ip  emphysema  . Coronary artery calcification seen on CAT scan 12/03/2017   11/2017 Chest CT: Left anterior descending and right coronary atherosclerosis  . Diabetes mellitus without complication (HCC)    [id number] 6.5 as of 05-2019- diet changes, no meds currently   . DISC WITH RADICULOPATHY 01/03/2007   Qualifier: Diagnosis of  By: Drucie Ip    .  Emphysema of lung ([place])   . Esophageal reflux 01/03/2007   Centricity Description: GASTROESOPHAGEAL REFLUX, NO ESOPHAGITIS Qualifier: Diagnosis of  By: Drucie Ip   Centricity Description: GERD Qualifier: Diagnosis of  By: Tye Savoy MD, Tommi Rumps    . External hemorrhoids s/p hemorrhoidectomy 03/28/2018 03/28/2018  . Fibromyalgia   . Fracture of bone spur of inferior portion of calcaneus 10/06/2018  . Functional gastrointestinal disorder 06/18/2008   Functional Gastrointestinal Disorder with frequent eructation and bloating sensation with acute flares.  Responsive to Compazine and Bentyl.  Takes a few days to calm down.    Marland Kitchen GERD without esophagitis 01/03/2007   Centricity Description: GASTROESOPHAGEAL REFLUX, NO ESOPHAGITIS Qualifier: Diagnosis of  By: Drucie Ip   Centricity Description: GERD Qualifier: Diagnosis of  By: Tye Savoy MD, Tommi Rumps    . Heel spur, fracture, left 04/17/2018  . HEMORRHOIDS, NOS 01/03/2007   Qualifier: Diagnosis of  By: Drucie Ip    . Hepatic steatosis 04/12/2011  . Herpes simplex labialis 05/11/2011  . Hip osteoarthritis 05/23/2007   MRI Pelvis (03/13/07) Bilateral osteoarthritis of the hips, left greater than right.  The  patient has a slightly more prominent hip effusion on the left than  the right.  No other significant abnormality.    . History of MRSA infection 04/28/2011  . HYPERCHOLESTEROLEMIA 12/24/2008  . Hypertension   .  HYPERTENSION, BENIGN SYSTEMIC 01/03/2007   Qualifier: Diagnosis of  By: Drucie Ip    . HYPERTRIGLYCERIDEMIA 11/11/2007   Qualifier: Diagnosis of  By: McDiarmid MD, Sherren Mocha    . Hypokalemia 12/12/2017  . Hypothyroidism 02/15/2007   Qualifier: Diagnosis of  By: McDiarmid MD, Sherren Mocha    . HYPOTHYROIDISM, BORDERLINE 02/15/2007  . Irritable bowel syndrome 01/03/2007   Qualifier: Diagnosis of  By: Drucie Ip    . LACTOSE INTOLERANCE 03/10/2008  . Major depressive disorder, recurrent episode ([place]) 01/03/2007   Qualifier: Diagnosis of  By: Drucie Ip    . Medication management 12/14/2016   Managing topiramate for weight loss starting 12/14/16  . Obesity (BMI 30.0-34.9) 09/30/2007   Has lost 55 lbs since easter. Goal of <190 by 08/05/12.     . Obesity, Class III, BMI 40-49.9 (morbid obesity) ([place]) 09/30/2007       . Osteoarthritis of spine 01/03/2007   MR I Lumbar (08/03): GeneralDDD; L2-3 Large  HNP.  Mod pos  hnp - 12/14/2005, smhnpw/irritL4root - 12/14/2005    . OSTEOARTHRITIS, HANDS, BILATERAL 06/18/2008   Qualifier: Diagnosis of  By: McDiarmid MD, Sherren Mocha    . PATELLO FEMORAL STRESS SYNDROME 01/03/2007   Qualifier: Diagnosis of  By: Drucie Ip    . Periodic limb movements of sleep 12/05/2010   Diagnosed on Polysomnography testing Fall 2011.     Marland Kitchen Pneumonia    walking pneumonia in the 90's  . Pre-diabetes 12/25/2008   Qualifier: Diagnosis of  By: McDiarmid MD, Sherren Mocha    . Prolapsed internal hemorrhoids, grade 3, s/p ligation/pexy/hemorrhoidectomy 03/28/2018 03/28/2018  . RESTLESS LEGS SYNDROME 09/11/2007   Polysomnography (10/2010, Dr Keturah Barre, interpreter. ) showed periodic limb movements that frequently awoke patient from sleep with arousal index of 4 per hour. Dr Danton Sewer (Sleep Specialist) thought her RLS was the major contributor to patient poor sleep condition, more than any sleep apnea.  He recommended considering opamine agonist (either requip or mirapex) to see if this will help.     . Rosacea, acne 10/24/2011  . SIGMOID POLYP 01/23/2008  . Solitary lung nodule 04/08/2011  . Stress incontinence   . Synovial cyst of lumbar facet joint 02/17/2016  . Trigger thumb of left hand 08/16/2018  . Urticaria, chronic 05/22/2011  . VITAMIN D DEFICIENCY 03/03/2010  . Work-related condition 05/17/2018    Past Surgical History:  Procedure Laterality Date  . APPENDECTOMY    . bladder tack  1998   Dr Jeffie Pollock (urology)  . BUNIONECTOMY  1992   bilateral feet with pins in great toes  . CARDIAC CATHETERIZATION    . CARPAL TUNNEL RELEASE   2010   Bilateral, Dr Theodis Sato  . COLONOSCOPY  2004   Dr Verdia Kuba (GI)  . CYSTOCELE REPAIR  1998    Dr Gertie Fey (GYN)-  . ESOPHAGOGASTRODUODENOSCOPY    . EVALUATION UNDER ANESTHESIA WITH HEMORRHOIDECTOMY N/A 03/28/2018   Procedure: ANORECTAL EXAM UNDER ANESTHESIA lateral internal partial sphincterotomy, hemorrhoidal ligation pixie, HEMORRHOIDECTOMY;  Surgeon: Michael Boston, MD;  Location: [facility];  Service: General;  Laterality: N/A;  . EXAM ANORECTAL W/ ULTRASOUND     Dr. Johney Maine 03-28-18   . EXPLORATORY LAPAROTOMY    . FACIAL LACERATIONS REPAIR     Facial Laceration repair as adolescent   . INJECTION HIP INTRA ARTICULAR  2008   Left Hip fluoroscopically-guided corticosteorid injection for painful osteoarthritis with good effect (06/2007)  . LAPAROSCOPY FOR ECTOPIC PREGNANCY    . LUMBAR LAMINECTOMY/DECOMPRESSION MICRODISCECTOMY N/A 06/21/2016  Procedure: MICRO LUMBAR DECOMPRESSION L4-L5 AND REMOVAL OF SYNOVIAL CYST    1 LEVEL;  Surgeon: Susa Day, MD;  Location: [facility];  Service: Orthopedics;  Laterality: N/A;  . NM MYOVIEW LTD  2011   Dr Daneen Schick, III (Card)  . RECTOCELE REPAIR  1998   Dr Gertie Fey (GYN)  . SPHINCTEROTOMY N/A 03/28/2018   Procedure: ANAL SPHINCTEROTOMY;  Surgeon: Michael Boston, MD;  Location: [facility];  Service: General;  Laterality: N/A;  . UPPER GASTROINTESTINAL ENDOSCOPY      OB History   No obstetric history on file.     Social History   Socioeconomic History  . Marital status: Divorced    Spouse name: Not on file  . Number of children: 4  . Years of education: 87  . Highest education level: 12th grade  Occupational History  . Occupation: Surveyor, quantity: [employer]  Tobacco Use  . Smoking status: Former Smoker    Packs/day: 1.50    Years: 44.00    Pack years: 66.00    Types: Cigarettes    Quit date: 04/06/2008    Years since quitting: 11.9  . Smokeless tobacco: Never Used  Substance and Sexual Activity  . Alcohol use: No  . Drug use: No     Comment: hx of marijuana use - last used 2 months ago   . Sexual activity: Not Currently  Other Topics Concern  . Not on file  Social History Narrative   Retired [place] [employer] worker - early retirement for mental health reasons.    Has Worked part-time  at [employer].Marland KitchenMarland KitchenWorking as Scientist, water quality at [employer] on [address]   pt is separated.    Living with her dgt and son-in-law in two story home.   1 flight of stairs     Quit smoking tobacco June 09 started at age 20. 1 1/2 to 2 ppd .   No etoh    Hx of smokes marijuana - periodically attempts quitting.   Religious - Pentecostal   4 children    Hobbies- word searches and games on her tablet.   No IVDA   Social Determinants of Health   Financial Resource Strain: Low Risk   . Difficulty of Paying Living Expenses: Not very hard  Food Insecurity: No Food Insecurity  . Worried About Charity fundraiser in the Last Year: Never true  . Ran Out of Food in the Last Year: Never true  Transportation Needs: No Transportation Needs  . Lack of Transportation (Medical): No  . Lack of Transportation (Non-Medical): No  Physical Activity: Inactive  . Days of Exercise per Week: 0 days  . Minutes of Exercise per Session: 0 min  Stress: Stress Concern Present  . Feeling of Stress : To some extent  Social Connections: Somewhat Isolated  . Frequency of Communication with Friends and Family: Twice a week  . Frequency of Social Gatherings with Friends and Family: More than three times a week  . Attends Religious Services: More than 4 times per year  . Active Member of Clubs or Organizations: No  . Attends Archivist Meetings: Never  . Marital Status: Divorced    Family History  Problem Relation Age of Onset  . Diabetes Father   . Heart disease Father   . Diabetes type II Other   . Hypertension Other   . Heart attack Other   . Alcohol abuse Other   . Depression Other   . Asthma Other   . Migraines  Other   . Hypertension Mother   .  Hypertension Sister   . Obesity Sister   . Kidney disease Brother   . Hypertension Daughter   . Hypertension Son   . Heart disease Brother   . Hypertension Brother   . Bipolar disorder Daughter   . Hypertension Daughter   . Hypertension Son   . Colon polyps Son   . Esophageal cancer Neg Hx   . Stomach cancer Neg Hx   . Rectal cancer Neg Hx   . Colon cancer Neg Hx      Current Outpatient Medications:  .  atorvastatin (LIPITOR) 20 MG tablet, Take 1 tablet (20 mg total) by mouth daily., Disp: 34 tablet, Rfl: prn .  Calcium Carb-Cholecalciferol (CALCIUM 1000 + D) 1000-800 MG-UNIT TABS, Take by mouth., Disp: , Rfl:  .  citalopram (CELEXA) 40 MG tablet, Take 0.5 tablets (20 mg total) by mouth at bedtime. (Patient taking differently: Take 40 mg by mouth daily. ), Disp: , Rfl: 0 .  Continuous Blood Gluc Sensor (FREESTYLE LIBRE 14 DAY SENSOR) MISC, 1 Device by Does not apply route every 14 (fourteen) days., Disp: 2 each, Rfl: PRN .  dicyclomine (BENTYL) 20 MG tablet, TAKE 1/2 TO 1 TABLET 4     TIMES A DAY BEFORE MEALS   AND AT BEDTIME (Patient taking differently: 1 TABLET once daily), Disp: 180 tablet, Rfl: 6 .  fexofenadine (ALLEGRA) 180 MG tablet, Take 180 mg by mouth daily., Disp: , Rfl:  .  fluticasone (FLONASE) 50 MCG/ACT nasal spray, Place 2 sprays into both nostrils daily., Disp: , Rfl:  .  glucose blood (ONETOUCH ULTRA) test strip, TEST 2 TIMES A DAY as directed, Disp: 50 strip, Rfl: 5 .  hydrochlorothiazide (HYDRODIURIL) 25 MG tablet, Take 1 tablet (25 mg total) by mouth daily., Disp: 90 tablet, Rfl: 3 .  HYDROcodone-acetaminophen (NORCO) 10-325 MG tablet, Take 1 tablet by mouth every 6 (six) hours as needed., Disp: 35 tablet, Rfl: 0 .  hydrOXYzine (VISTARIL) 50 MG capsule, Take 150 mg by mouth at bedtime. , Disp: , Rfl:  .  ketoconazole (NIZORAL) 2 % cream, Apply 1 application topically daily., Disp: 15 g, Rfl: 0 .  levothyroxine (SYNTHROID) 50 MCG tablet, TAKE 2 1/2  TABLETS BY MOUTH  EVERY DAY BEFORE BREAKFAST, Disp: 270 tablet, Rfl: 3 .  LORazepam (ATIVAN) 1 MG tablet, Take 1 mg by mouth 3 (three) times daily. , Disp: , Rfl:  .  metoprolol tartrate (LOPRESSOR) 50 MG tablet, TAKE 1 TABLET TWICE A DAY, Disp: 180 tablet, Rfl: 3 .  morphine (MS CONTIN) 15 MG 12 hr tablet, Take 1 tablet (15 mg total) by mouth every 12 (twelve) hours., Disp: 60 tablet, Rfl: 0 .  Multiple Minerals-Vitamins (CALCIUM-MAGNESIUM-ZINC-D3) TABS, Take 3 tablets by mouth daily., Disp: , Rfl:  .  nitroGLYCERIN (NITROSTAT) 0.4 MG SL tablet, Place 1 tablet (0.4 mg total) under the tongue every 5 (five) minutes as needed for chest pain., Disp: 25 tablet, Rfl: prn .  omeprazole (PRILOSEC) 40 MG capsule, Take 1 capsule (40 mg total) by mouth daily., Disp: 90 capsule, Rfl: 3 .  OneTouch Delica Lancets [id number] MISC, USE 3 TIMES A DAY AS DIRECTED, Disp: , Rfl:  .  polyethylene glycol (MIRALAX) powder, Take 17 g by mouth as needed for mild constipation. , Disp: , Rfl:  .  potassium chloride (KLOR-CON M10) 10 MEQ tablet, Take 4 tablets (40 mEq total) by mouth at bedtime., Disp: 360 tablet, Rfl: 3 .  prazosin (MINIPRESS) 1 MG capsule, Take 1 capsule (1 mg total) by mouth at bedtime., Disp: , Rfl:  .  ramipril (ALTACE) 10 MG capsule, Take 1 capsule (10 mg total) by mouth daily., Disp: 90 capsule, Rfl: 3 .  Simethicone (GAS-X PO), Take 2 tablets by mouth daily as needed (gas)., Disp: , Rfl:  .  tiZANidine (ZANAFLEX) 2 MG tablet, Take 1 tablet (2 mg total) by mouth at bedtime. occ uses an  Additional dose in  the day, Disp: 30 tablet, Rfl: 2 .  vitamin B-12 (CYANOCOBALAMIN) 1000 MCG tablet, Take 1,000 mcg by mouth daily., Disp: , Rfl:   Review of Systems  Review of Systems  Constitutional: Negative for fever, chills, weight loss, malaise/fatigue and diaphoresis.  HENT: Negative for hearing loss, ear pain, nosebleeds, congestion, sore throat, neck pain, tinnitus and ear discharge.   Eyes: Negative for blurred vision, double  vision, photophobia, pain, discharge and redness.  Respiratory: Negative for cough, hemoptysis, sputum production, shortness of breath, wheezing and stridor.   Cardiovascular: Negative for chest pain, palpitations, orthopnea, claudication, leg swelling and PND.  Gastrointestinal: negative for abdominal pain. Negative for heartburn, nausea, vomiting, diarrhea, constipation, blood in stool and melena.  Genitourinary: Negative for dysuria, urgency, frequency, hematuria and flank pain.  Musculoskeletal: Negative for myalgias, back pain, joint pain and falls.  Skin: Negative for itching and rash.  Neurological: Negative for dizziness, tingling, tremors, sensory change, speech change, focal weakness, seizures, loss of consciousness, weakness and headaches.  Endo/Heme/Allergies: Negative for environmental allergies and polydipsia. Does not bruise/bleed easily.  Psychiatric/Behavioral: Negative for depression, suicidal ideas, hallucinations, memory loss and substance abuse. The patient is not nervous/anxious and does not have insomnia.        Objective:  Blood pressure 124/78, pulse 80, height 5\' 4"  (1.626 m), weight 280 lb (127 kg).   Physical Exam  Vitals reviewed. Constitutional: She is oriented to person, place, and time. She appears well-developed and well-nourished.  HENT:  Head: Normocephalic and atraumatic.     Respiratory: Effort normal and breath sounds normal. No respiratory distress.  Genitourinary:  Vulva is normal without lesions Vagina is pink moist without discharge Neurological: She is alert and oriented to person, place, and time.  Psychiatric: She has a normal mood and affect. Her behavior is normal. Judgment and thought content normal.      Assessment/Plan:   Patient presenting today to have Pap smear done. Patient is over age 26 we would continue screening but given that she has not had one in many many years patient requesting that it be performed. Pap smear performed today  and no abnormalities noted. Patient will likely not need one in the future unless she develops any symptoms.  [place] Jisselle Poth, DO PGY-3, [facility]

## 2020-03-18 LAB — CYTOLOGY - PAP
Comment: NEGATIVE
Diagnosis: NEGATIVE
High risk HPV: NEGATIVE

## 2020-03-24 ENCOUNTER — Other Ambulatory Visit: Payer: Self-pay | Admitting: Family Medicine

## 2020-03-24 DIAGNOSIS — M797 Fibromyalgia: Secondary | ICD-10-CM

## 2020-04-01 ENCOUNTER — Encounter: Payer: Self-pay | Admitting: Family Medicine

## 2020-04-01 ENCOUNTER — Ambulatory Visit (INDEPENDENT_AMBULATORY_CARE_PROVIDER_SITE_OTHER): Payer: PPO | Admitting: Family Medicine

## 2020-04-01 ENCOUNTER — Other Ambulatory Visit: Payer: Self-pay

## 2020-04-01 VITALS — BP 130/86 | HR 74 | Ht 64.0 in | Wt 281.0 lb

## 2020-04-01 DIAGNOSIS — M47896 Other spondylosis, lumbar region: Secondary | ICD-10-CM | POA: Diagnosis not present

## 2020-04-01 DIAGNOSIS — E7800 Pure hypercholesterolemia, unspecified: Secondary | ICD-10-CM | POA: Diagnosis not present

## 2020-04-01 DIAGNOSIS — E669 Obesity, unspecified: Secondary | ICD-10-CM

## 2020-04-01 DIAGNOSIS — G894 Chronic pain syndrome: Secondary | ICD-10-CM

## 2020-04-01 DIAGNOSIS — M160 Bilateral primary osteoarthritis of hip: Secondary | ICD-10-CM | POA: Diagnosis not present

## 2020-04-01 DIAGNOSIS — E1169 Type 2 diabetes mellitus with other specified complication: Secondary | ICD-10-CM

## 2020-04-01 DIAGNOSIS — E1159 Type 2 diabetes mellitus with other circulatory complications: Secondary | ICD-10-CM | POA: Diagnosis not present

## 2020-04-01 DIAGNOSIS — R350 Frequency of micturition: Secondary | ICD-10-CM

## 2020-04-01 DIAGNOSIS — I10 Essential (primary) hypertension: Secondary | ICD-10-CM

## 2020-04-01 DIAGNOSIS — I152 Hypertension secondary to endocrine disorders: Secondary | ICD-10-CM

## 2020-04-01 DIAGNOSIS — G2581 Restless legs syndrome: Secondary | ICD-10-CM | POA: Diagnosis not present

## 2020-04-01 DIAGNOSIS — M48061 Spinal stenosis, lumbar region without neurogenic claudication: Secondary | ICD-10-CM | POA: Diagnosis not present

## 2020-04-01 DIAGNOSIS — N3946 Mixed incontinence: Secondary | ICD-10-CM

## 2020-04-01 DIAGNOSIS — Z79899 Other long term (current) drug therapy: Secondary | ICD-10-CM | POA: Diagnosis not present

## 2020-04-01 LAB — POCT URINALYSIS DIP (MANUAL ENTRY)
Bilirubin, UA: NEGATIVE
Blood, UA: NEGATIVE
Glucose, UA: NEGATIVE mg/dL
Ketones, POC UA: NEGATIVE mg/dL
Leukocytes, UA: NEGATIVE
Nitrite, UA: NEGATIVE
Protein Ur, POC: NEGATIVE mg/dL
Spec Grav, UA: 1.01 (ref 1.010–1.025)
Urobilinogen, UA: 0.2 E.U./dL
pH, UA: 7 (ref 5.0–8.0)

## 2020-04-01 LAB — POCT GLYCOSYLATED HEMOGLOBIN (HGB A1C): HbA1c, POC (controlled diabetic range): 6.8 % (ref 0.0–7.0)

## 2020-04-01 MED ORDER — MORPHINE SULFATE ER 15 MG PO TBCR: 15.0000 mg | EXTENDED_RELEASE_TABLET | Freq: Two times a day (BID) | ORAL | 0 refills | Status: DC

## 2020-04-01 MED ORDER — HYDROCODONE-ACETAMINOPHEN 10-325 MG PO TABS: 1.0000 | ORAL_TABLET | Freq: Four times a day (QID) | ORAL | 0 refills | Status: DC | PRN

## 2020-04-01 MED ORDER — MIRABEGRON ER 25 MG PO TB24: 25.0000 mg | ORAL_TABLET | Freq: Every day | ORAL | Status: DC

## 2020-04-01 NOTE — Patient Instructions (Addendum)
Referrals to [facility]  Start mirabegron (Mybetriq) one table daily to help treat your bladder problems.

## 2020-04-02 ENCOUNTER — Encounter: Payer: Self-pay | Admitting: Family Medicine

## 2020-04-02 DIAGNOSIS — N3946 Mixed incontinence: Secondary | ICD-10-CM | POA: Insufficient documentation

## 2020-04-02 LAB — LIPID PANEL
Chol/HDL Ratio: 2.8 ratio (ref 0.0–4.4)
Cholesterol, Total: 143 mg/dL (ref 100–199)
HDL: 51 mg/dL (ref >39)
LDL Chol Calc (NIH): 62 mg/dL (ref 0–99)
Triglycerides: 180 mg/dL — ABNORMAL HIGH (ref 0–149)
VLDL Cholesterol Cal: 30 mg/dL (ref 5–40)

## 2020-04-02 LAB — BASIC METABOLIC PANEL
BUN/Creatinine Ratio: 17 (ref 12–28)
BUN: 17 mg/dL (ref 8–27)
CO2: 27 mmol/L (ref 20–29)
Calcium: 10.3 mg/dL (ref 8.7–10.3)
Chloride: 96 mmol/L (ref 96–106)
Creatinine, Ser: 1.01 mg/dL — ABNORMAL HIGH (ref 0.57–1.00)
GFR calc Af Amer: 66 mL/min/{1.73_m2} (ref >59)
GFR calc non Af Amer: 57 mL/min/{1.73_m2} — ABNORMAL LOW (ref >59)
Glucose: 106 mg/dL — ABNORMAL HIGH (ref 65–99)
Potassium: 4.5 mmol/L (ref 3.5–5.2)
Sodium: 138 mmol/L (ref 134–144)

## 2020-04-02 NOTE — Assessment & Plan Note (Signed)
Established problem Controlled Continue current medications and other regiments  

## 2020-04-02 NOTE — Assessment & Plan Note (Signed)
Established problem Lab Results  Component Value Date   HGBA1C 6.8 04/01/2020    Controlled Continue Lifestyle changes.

## 2020-04-02 NOTE — Assessment & Plan Note (Addendum)
Established problem worsened.  Start mirabegron medications and referral to [facility]: Gabriella Reaper Natacia Chaisson, MD for possible urodynamics per patient's request

## 2020-04-02 NOTE — Assessment & Plan Note (Signed)
Established problem Controlled Continue current therapy regiment. Rx for MS Continone month and Norco 10/325 one meonth supply Review of [place] CSRS DB showed no evidence of doctor shopping.

## 2020-04-02 NOTE — Assessment & Plan Note (Signed)
Established problem Lab Results  Component Value Date   CHOL 143 04/01/2020   HDL 51 04/01/2020   LDLCALC 62 04/01/2020   LDLDIRECT 58 08/07/2019   TRIG 180 (H) 04/01/2020   CHOLHDL 2.8 04/01/2020    Controlled Continue current medications and other regiments

## 2020-04-02 NOTE — Assessment & Plan Note (Signed)
Uncontrolled chornic problem Referral to [facility] for weight loss and learn about Bariatric surgery

## 2020-04-06 ENCOUNTER — Encounter: Payer: Self-pay | Admitting: Family Medicine

## 2020-04-07 DIAGNOSIS — F3181 Bipolar II disorder: Secondary | ICD-10-CM | POA: Diagnosis not present

## 2020-04-07 DIAGNOSIS — F4312 Post-traumatic stress disorder, chronic: Secondary | ICD-10-CM | POA: Diagnosis not present

## 2020-04-07 DIAGNOSIS — G8929 Other chronic pain: Secondary | ICD-10-CM | POA: Diagnosis not present

## 2020-04-07 MED ORDER — MIRABEGRON ER 25 MG PO TB24: 25.0000 mg | ORAL_TABLET | Freq: Every day | ORAL | 1 refills | Status: DC

## 2020-04-21 DIAGNOSIS — F3181 Bipolar II disorder: Secondary | ICD-10-CM | POA: Diagnosis not present

## 2020-04-21 DIAGNOSIS — G8929 Other chronic pain: Secondary | ICD-10-CM | POA: Diagnosis not present

## 2020-04-21 DIAGNOSIS — F4312 Post-traumatic stress disorder, chronic: Secondary | ICD-10-CM | POA: Diagnosis not present

## 2020-04-29 DIAGNOSIS — G8929 Other chronic pain: Secondary | ICD-10-CM | POA: Diagnosis not present

## 2020-04-29 DIAGNOSIS — F4312 Post-traumatic stress disorder, chronic: Secondary | ICD-10-CM | POA: Diagnosis not present

## 2020-04-29 DIAGNOSIS — F3181 Bipolar II disorder: Secondary | ICD-10-CM | POA: Diagnosis not present

## 2020-05-03 ENCOUNTER — Encounter: Payer: Self-pay | Admitting: Family Medicine

## 2020-05-03 DIAGNOSIS — E039 Hypothyroidism, unspecified: Secondary | ICD-10-CM

## 2020-05-05 ENCOUNTER — Telehealth: Payer: Self-pay

## 2020-05-05 DIAGNOSIS — M47896 Other spondylosis, lumbar region: Secondary | ICD-10-CM

## 2020-05-05 DIAGNOSIS — M160 Bilateral primary osteoarthritis of hip: Secondary | ICD-10-CM

## 2020-05-05 DIAGNOSIS — G894 Chronic pain syndrome: Secondary | ICD-10-CM

## 2020-05-05 DIAGNOSIS — G2581 Restless legs syndrome: Secondary | ICD-10-CM

## 2020-05-05 DIAGNOSIS — M48061 Spinal stenosis, lumbar region without neurogenic claudication: Secondary | ICD-10-CM

## 2020-05-05 NOTE — Telephone Encounter (Signed)
Patient calling nurse line requesting refills on hydrocodone and MS Contin. Unable to find on current medication list.   To PCP  Please advise  Talbot Grumbling, RN

## 2020-05-06 ENCOUNTER — Other Ambulatory Visit: Payer: Self-pay | Admitting: Family Medicine

## 2020-05-06 DIAGNOSIS — E039 Hypothyroidism, unspecified: Secondary | ICD-10-CM

## 2020-05-06 MED ORDER — LEVOTHYROXINE SODIUM 125 MCG PO TABS: 125.0000 ug | ORAL_TABLET | ORAL | 3 refills | Status: DC

## 2020-05-06 MED ORDER — MORPHINE SULFATE ER 15 MG PO TBCR: 15.0000 mg | EXTENDED_RELEASE_TABLET | Freq: Two times a day (BID) | ORAL | 0 refills | Status: DC

## 2020-05-06 MED ORDER — HYDROCODONE-ACETAMINOPHEN 10-325 MG PO TABS: 1.0000 | ORAL_TABLET | Freq: Four times a day (QID) | ORAL | 0 refills | Status: DC | PRN

## 2020-05-06 NOTE — Progress Notes (Signed)
Changed dose from 50 mcg tablets, 2.5 tab daily, to 125 mcg daily, one tablet daily.

## 2020-05-06 NOTE — Telephone Encounter (Signed)
Refills of Norco 10-325 and MS contin sent in

## 2020-05-12 DIAGNOSIS — F3181 Bipolar II disorder: Secondary | ICD-10-CM | POA: Diagnosis not present

## 2020-05-21 DIAGNOSIS — F4312 Post-traumatic stress disorder, chronic: Secondary | ICD-10-CM | POA: Diagnosis not present

## 2020-05-21 DIAGNOSIS — F3181 Bipolar II disorder: Secondary | ICD-10-CM | POA: Diagnosis not present

## 2020-05-21 DIAGNOSIS — G8929 Other chronic pain: Secondary | ICD-10-CM | POA: Diagnosis not present

## 2020-05-25 ENCOUNTER — Other Ambulatory Visit: Payer: Self-pay | Admitting: Family Medicine

## 2020-05-25 DIAGNOSIS — L219 Seborrheic dermatitis, unspecified: Secondary | ICD-10-CM

## 2020-05-30 ENCOUNTER — Other Ambulatory Visit: Payer: Self-pay | Admitting: Family Medicine

## 2020-06-02 ENCOUNTER — Encounter: Payer: Self-pay | Admitting: Family Medicine

## 2020-06-03 ENCOUNTER — Other Ambulatory Visit: Payer: Self-pay | Admitting: Family Medicine

## 2020-06-03 DIAGNOSIS — K929 Disease of digestive system, unspecified: Secondary | ICD-10-CM

## 2020-06-03 MED ORDER — PROCHLORPERAZINE MALEATE 10 MG PO TABS: ORAL_TABLET | ORAL | 1 refills | Status: DC

## 2020-06-07 DIAGNOSIS — F3181 Bipolar II disorder: Secondary | ICD-10-CM | POA: Diagnosis not present

## 2020-06-07 DIAGNOSIS — F4312 Post-traumatic stress disorder, chronic: Secondary | ICD-10-CM | POA: Diagnosis not present

## 2020-06-10 ENCOUNTER — Other Ambulatory Visit: Payer: Self-pay

## 2020-06-10 ENCOUNTER — Ambulatory Visit (INDEPENDENT_AMBULATORY_CARE_PROVIDER_SITE_OTHER): Payer: PPO | Admitting: Family Medicine

## 2020-06-10 ENCOUNTER — Encounter: Payer: Self-pay | Admitting: Family Medicine

## 2020-06-10 VITALS — BP 126/70 | HR 60 | Ht 64.0 in | Wt 273.0 lb

## 2020-06-10 DIAGNOSIS — E1169 Type 2 diabetes mellitus with other specified complication: Secondary | ICD-10-CM

## 2020-06-10 DIAGNOSIS — M47896 Other spondylosis, lumbar region: Secondary | ICD-10-CM

## 2020-06-10 DIAGNOSIS — M48061 Spinal stenosis, lumbar region without neurogenic claudication: Secondary | ICD-10-CM

## 2020-06-10 DIAGNOSIS — G894 Chronic pain syndrome: Secondary | ICD-10-CM

## 2020-06-10 DIAGNOSIS — E669 Obesity, unspecified: Secondary | ICD-10-CM | POA: Diagnosis not present

## 2020-06-10 DIAGNOSIS — G2581 Restless legs syndrome: Secondary | ICD-10-CM

## 2020-06-10 DIAGNOSIS — M160 Bilateral primary osteoarthritis of hip: Secondary | ICD-10-CM | POA: Diagnosis not present

## 2020-06-10 DIAGNOSIS — E119 Type 2 diabetes mellitus without complications: Secondary | ICD-10-CM

## 2020-06-10 LAB — POCT GLYCOSYLATED HEMOGLOBIN (HGB A1C): HbA1c, POC (controlled diabetic range): 6.6 % (ref 0.0–7.0)

## 2020-06-10 MED ORDER — MORPHINE SULFATE ER 15 MG PO TBCR: 15.0000 mg | EXTENDED_RELEASE_TABLET | Freq: Two times a day (BID) | ORAL | 0 refills | Status: DC

## 2020-06-10 MED ORDER — HYDROCODONE-ACETAMINOPHEN 10-325 MG PO TABS: 1.0000 | ORAL_TABLET | Freq: Four times a day (QID) | ORAL | 0 refills | Status: DC | PRN

## 2020-06-10 NOTE — Progress Notes (Addendum)
[name] is alone Sources of clinical information for visit is/are patient and past medical records. Nursing assessment for this office visit was reviewed with the patient for accuracy and revision.     Previous Report(s) Reviewed: office notes  Depression screen PHQ 2/9 06/10/2020  Decreased Interest 2  Down, Depressed, Hopeless 1  PHQ - 2 Score 3  Altered sleeping 2  Tired, decreased energy 3  Change in appetite 2  Feeling bad or failure about yourself  1  Trouble concentrating 1  Moving slowly or fidgety/restless 0  Suicidal thoughts 0  PHQ-9 Score 12  Difficult doing work/chores Very difficult  Some recent data might be hidden    Fall Risk  04/01/2020 03/17/2020 07/03/2019 07/02/2019 05/21/2019  Falls in the past year? 0 0 0 0 1  Number falls in past yr: 0 - - - 0  Injury with Fall? - - - - 0  Risk Factor Category  - - - - -  Risk for fall due to : - - - Impaired balance/gait -    PHQ9 SCORE ONLY 06/10/2020 04/01/2020 03/17/2020  PHQ-9 Total Score 12 4 7     Adult vaccines due  Topic Date Due  . TETANUS/TDAP  10/21/2023    Health Maintenance Due  Topic Date Due  . OPHTHALMOLOGY EXAM  Never done  . INFLUENZA VACCINE  06/06/2020      History/P.E. limitations: none  Adult vaccines due  Topic Date Due  . TETANUS/TDAP  10/21/2023    Diabetes Health Maintenance Due  Topic Date Due  . OPHTHALMOLOGY EXAM  Never done  . FOOT EXAM  08/06/2020  . HEMOGLOBIN A1C  12/11/2020    Health Maintenance Due  Topic Date Due  . OPHTHALMOLOGY EXAM  Never done  . INFLUENZA VACCINE  06/06/2020     Chief Complaint  Patient presents with  . Diabetes     CHRONIC DIABETES New diagnosis on 05/21/19 [facility] ov  Disease Monitoring  Blood Sugar Ranges: Libre Freestyle CGM: reports running in high 90s to low hundreds.   Polyuria: no   Visual problems: no   Recent Medication Changes:  no   Medication Regiment Adherence: [name] is not taking any antidiabetes meds  Diet  Pattern (Number & Time of meals): improving, less high fat and simple CH2O.  Losing weight  Recent Physical Illness: no   Emotional Stressors: Relationship moving out of her dgt's home into a new  home with another dgt, unable to work bc [place] and Work .  Level of Activity: sedentary.  Spending large portion day in bed   [place]                          Taking Prescribed Statin: no             Taking Prescribed ACEI/ARB: yes                 Last Eye Exam:              Last Nutrition Consult: none    Chronic Pain Syndrome and Osteoarthritis of hips - locations bilateral upper thorax posteriorly, left buttock, and right ankle - HPI Uses Vicodin as well as Topical balms, which does help  ADE with analgesic therapies: constipation Non-allopathic therapites:none  Pain Inventory Average Pain: 7 Pain Right Now: 7  What TIME of day is your pain at its worst? night Sleep (in general): One Vicodin at bedtime  stops her severe restless leg symptoms, sometimes has to take an ex Pain is worse with: walking and prolonged sitting Pain improves with: prone position Relief from Meds: 90 (percentage relief)  Mobility walk without assistance: yes use a cane/walker: no ability to climb steps?: yes, but difficult.  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  No changes to psychotropic regiment.  PHQ9 = 13 today (06/26/19) somewhat to very difficult,  with Answer 9 = 0   Morbid Obesity - Weightdown 8 pounds since diagnosis of DMT2 about 1 month ago. Working on making more nutritious choices for sncks   Bipolar 2 disorder - longstanding issue - improved with [facility] therapy back in January 2020 . Continues o citalopram,  vistaril, lorazepam, and trazodone by Dr Reece Levy (Psych)  CHRONIC HYPERTENSION  Disease Monitoring  Blood pressure range: not checking at home  Chest pain: no   Dyspnea: no   Claudication: no   Medication compliance: yes  Medication Side Effects  Lightheadedness: no   Urinary frequency: no   Edema: yes, trace at ankles    [place]:  Exercise: no   Diet Pattern: eating most calories at evening meal and in evening snacks  Salt Restriction: no      SH: No smoking  ROS: See HPI  Physical exam Vitals:   08/15/18 1353  BP: (!) 150/68  Pulse: 70  Temp: 98.1 F (36.7 C)  SpO2: 95%   .General; Obese, groomed, distressed intermittently due to left should pain per pt Cor: RRR, no M Lung: BCTA, no acc mm use MSK: left neck with just neck pain with vertical compressions, nontender in interscap area, neg impingement.   Neuro: intact bicep and tricap DTR bil symm; 5/5 grip/elbow, shoulder strength.  Normal gait.  Psych: euthymic/normal speech/ concrete language/ goal directed thoughts  Diabetic Foot Exam - Simple   Simple Foot Form Diabetic Foot exam was performed with the following findings: Yes 08/07/2019 10:19 AM  Visual Inspection No deformities, no ulcerations, no other skin breakdown bilaterally: Yes Sensation Testing Intact to touch and monofilament testing bilaterally: Yes Pulse Check Posterior Tibialis and Dorsalis pulse intact bilaterally: Yes Comments    Skin: Erythema patch across brows/glabella, nasolabial folds bilaterally symmetric, shiny surface.  No posterior auricular rashes

## 2020-06-10 NOTE — Progress Notes (Signed)
21 

## 2020-06-10 NOTE — Patient Instructions (Signed)
Your blood sugar is good. You are down 8 pounds.  Your blood pressure is uder good control.    Make sure you get your eye exam in next few months.    Restart taking your Vitamin B12 1000 daily.

## 2020-06-14 NOTE — Assessment & Plan Note (Signed)
Lab Results  Component Value Date   HGBA1C 6.6 06/10/2020   Established problem that has improved.  Continue current lifestyle changes. The [facility] has help [name] identify the effects of different dietary choices on her blood glucoe.

## 2020-06-14 NOTE — Assessment & Plan Note (Signed)
Wt Readings from Last 3 Encounters:  06/10/20 273 lb (123.8 kg)  04/01/20 281 lb (127.5 kg)  03/17/20 280 lb (127 kg)   Established problem that has improved.  Continuing her self initiated dietary changes with CGM to help her see glycemic impact of food choices

## 2020-06-15 ENCOUNTER — Other Ambulatory Visit: Payer: Self-pay | Admitting: Family Medicine

## 2020-06-15 DIAGNOSIS — M797 Fibromyalgia: Secondary | ICD-10-CM

## 2020-06-28 DIAGNOSIS — G8929 Other chronic pain: Secondary | ICD-10-CM | POA: Diagnosis not present

## 2020-06-28 DIAGNOSIS — F3181 Bipolar II disorder: Secondary | ICD-10-CM | POA: Diagnosis not present

## 2020-06-28 DIAGNOSIS — F4312 Post-traumatic stress disorder, chronic: Secondary | ICD-10-CM | POA: Diagnosis not present

## 2020-07-05 ENCOUNTER — Other Ambulatory Visit: Payer: Self-pay | Admitting: Family Medicine

## 2020-07-06 ENCOUNTER — Encounter: Payer: Self-pay | Admitting: Family Medicine

## 2020-07-06 DIAGNOSIS — G894 Chronic pain syndrome: Secondary | ICD-10-CM

## 2020-07-06 DIAGNOSIS — M47896 Other spondylosis, lumbar region: Secondary | ICD-10-CM

## 2020-07-06 DIAGNOSIS — M48061 Spinal stenosis, lumbar region without neurogenic claudication: Secondary | ICD-10-CM

## 2020-07-06 DIAGNOSIS — M160 Bilateral primary osteoarthritis of hip: Secondary | ICD-10-CM

## 2020-07-06 DIAGNOSIS — G2581 Restless legs syndrome: Secondary | ICD-10-CM

## 2020-07-06 MED ORDER — HYDROCODONE-ACETAMINOPHEN 10-325 MG PO TABS: 1.0000 | ORAL_TABLET | Freq: Four times a day (QID) | ORAL | 0 refills | Status: DC | PRN

## 2020-07-06 MED ORDER — MORPHINE SULFATE ER 15 MG PO TBCR: 15.0000 mg | EXTENDED_RELEASE_TABLET | Freq: Two times a day (BID) | ORAL | 0 refills | Status: DC

## 2020-07-13 ENCOUNTER — Other Ambulatory Visit: Payer: Self-pay | Admitting: Family Medicine

## 2020-07-13 DIAGNOSIS — K929 Disease of digestive system, unspecified: Secondary | ICD-10-CM

## 2020-07-14 DIAGNOSIS — F4312 Post-traumatic stress disorder, chronic: Secondary | ICD-10-CM | POA: Diagnosis not present

## 2020-07-14 DIAGNOSIS — G8929 Other chronic pain: Secondary | ICD-10-CM | POA: Diagnosis not present

## 2020-07-14 DIAGNOSIS — F3181 Bipolar II disorder: Secondary | ICD-10-CM | POA: Diagnosis not present

## 2020-08-02 DIAGNOSIS — G8929 Other chronic pain: Secondary | ICD-10-CM | POA: Diagnosis not present

## 2020-08-02 DIAGNOSIS — F3181 Bipolar II disorder: Secondary | ICD-10-CM | POA: Diagnosis not present

## 2020-08-02 DIAGNOSIS — F4312 Post-traumatic stress disorder, chronic: Secondary | ICD-10-CM | POA: Diagnosis not present

## 2020-08-04 ENCOUNTER — Encounter: Payer: Self-pay | Admitting: Family Medicine

## 2020-08-05 ENCOUNTER — Other Ambulatory Visit: Payer: Self-pay

## 2020-08-05 DIAGNOSIS — M160 Bilateral primary osteoarthritis of hip: Secondary | ICD-10-CM

## 2020-08-05 DIAGNOSIS — G894 Chronic pain syndrome: Secondary | ICD-10-CM

## 2020-08-05 DIAGNOSIS — M48061 Spinal stenosis, lumbar region without neurogenic claudication: Secondary | ICD-10-CM

## 2020-08-05 DIAGNOSIS — G2581 Restless legs syndrome: Secondary | ICD-10-CM

## 2020-08-05 DIAGNOSIS — M47896 Other spondylosis, lumbar region: Secondary | ICD-10-CM

## 2020-08-06 ENCOUNTER — Other Ambulatory Visit: Payer: Self-pay | Admitting: Family Medicine

## 2020-08-06 DIAGNOSIS — M48061 Spinal stenosis, lumbar region without neurogenic claudication: Secondary | ICD-10-CM

## 2020-08-06 DIAGNOSIS — G2581 Restless legs syndrome: Secondary | ICD-10-CM

## 2020-08-06 DIAGNOSIS — M47896 Other spondylosis, lumbar region: Secondary | ICD-10-CM

## 2020-08-06 DIAGNOSIS — M160 Bilateral primary osteoarthritis of hip: Secondary | ICD-10-CM

## 2020-08-06 DIAGNOSIS — G894 Chronic pain syndrome: Secondary | ICD-10-CM

## 2020-08-06 MED ORDER — HYDROCODONE-ACETAMINOPHEN 10-325 MG PO TABS: 1.0000 | ORAL_TABLET | Freq: Four times a day (QID) | ORAL | 0 refills | Status: DC | PRN

## 2020-08-06 MED ORDER — MORPHINE SULFATE ER 15 MG PO TBCR: 15.0000 mg | EXTENDED_RELEASE_TABLET | Freq: Two times a day (BID) | ORAL | 0 refills | Status: DC

## 2020-08-26 LAB — HM DIABETES EYE EXAM

## 2020-09-05 ENCOUNTER — Encounter: Payer: Self-pay | Admitting: Family Medicine

## 2020-09-05 DIAGNOSIS — M47896 Other spondylosis, lumbar region: Secondary | ICD-10-CM

## 2020-09-05 DIAGNOSIS — M48061 Spinal stenosis, lumbar region without neurogenic claudication: Secondary | ICD-10-CM

## 2020-09-05 DIAGNOSIS — G2581 Restless legs syndrome: Secondary | ICD-10-CM

## 2020-09-05 DIAGNOSIS — G894 Chronic pain syndrome: Secondary | ICD-10-CM

## 2020-09-05 DIAGNOSIS — M160 Bilateral primary osteoarthritis of hip: Secondary | ICD-10-CM

## 2020-09-06 ENCOUNTER — Other Ambulatory Visit: Payer: Self-pay | Admitting: Family Medicine

## 2020-09-06 DIAGNOSIS — M797 Fibromyalgia: Secondary | ICD-10-CM

## 2020-09-06 MED ORDER — HYDROCODONE-ACETAMINOPHEN 10-325 MG PO TABS: 1.0000 | ORAL_TABLET | Freq: Three times a day (TID) | ORAL | 0 refills | Status: DC | PRN

## 2020-09-06 MED ORDER — HYDROCODONE-ACETAMINOPHEN 10-325 MG PO TABS: 1.0000 | ORAL_TABLET | Freq: Four times a day (QID) | ORAL | 0 refills | Status: DC | PRN

## 2020-09-06 MED ORDER — MORPHINE SULFATE ER 15 MG PO TBCR: 15.0000 mg | EXTENDED_RELEASE_TABLET | Freq: Two times a day (BID) | ORAL | 0 refills | Status: DC

## 2020-09-16 ENCOUNTER — Ambulatory Visit (INDEPENDENT_AMBULATORY_CARE_PROVIDER_SITE_OTHER): Payer: PPO | Admitting: Family Medicine

## 2020-09-16 ENCOUNTER — Other Ambulatory Visit: Payer: Self-pay

## 2020-09-16 ENCOUNTER — Encounter: Payer: Self-pay | Admitting: Family Medicine

## 2020-09-16 DIAGNOSIS — E119 Type 2 diabetes mellitus without complications: Secondary | ICD-10-CM

## 2020-09-16 DIAGNOSIS — M25512 Pain in left shoulder: Secondary | ICD-10-CM | POA: Diagnosis not present

## 2020-09-16 DIAGNOSIS — F3181 Bipolar II disorder: Secondary | ICD-10-CM | POA: Diagnosis not present

## 2020-09-16 NOTE — Patient Instructions (Signed)
Shoulder Pain Many things can cause shoulder pain, including:  An injury.  Moving the shoulder in the same way again and again (overuse).  Joint pain (arthritis). Pain can come from:  Swelling and irritation (inflammation) of any part of the shoulder.  An injury to the shoulder joint.  An injury to: ? Tissues that connect muscle to bone (tendons). ? Tissues that connect bones to each other (ligaments). ? Bones. Follow these instructions at home: Watch for changes in your symptoms. Let your doctor know about them. Follow these instructions to help with your pain. If you have a sling:  Wear the sling as told by your doctor. Remove it only as told by your doctor.  Loosen the sling if your fingers: ? Tingle. ? Become numb. ? Turn cold and blue.  Keep the sling clean.  If the sling is not waterproof: ? Do not let it get wet. ? Take the sling off when you shower or bathe. Managing pain, stiffness, and swelling   If told, put ice on the painful area: ? Put ice in a plastic bag. ? Place a towel between your skin and the bag. ? Leave the ice on for 20 minutes, 2-3 times a day. Stop putting ice on if it does not help with the pain.  Squeeze a soft ball or a foam pad as much as possible. This prevents swelling in the shoulder. It also helps to strengthen the arm. General instructions  Take over-the-counter and prescription medicines only as told by your doctor.  Keep all follow-up visits as told by your doctor. This is important. Contact a doctor if:  Your pain gets worse.  Medicine does not help your pain.  You have new pain in your arm, hand, or fingers. Get help right away if:  Your arm, hand, or fingers: ? Tingle. ? Are numb. ? Are swollen. ? Are painful. ? Turn white or blue. Summary  Shoulder pain can be caused by many things. These include injury, moving the shoulder in the same away again and again, and joint pain.  Watch for changes in your symptoms.  Let your doctor know about them.  This condition may be treated with a sling, ice, and pain medicine.  Contact your doctor if the pain gets worse or you have new pain. Get help right away if your arm, hand, or fingers tingle or get numb, swollen, or painful.  Keep all follow-up visits as told by your doctor. This is important. This information is not intended to replace advice given to you by your health care provider. Make sure you discuss any questions you have with your health care provider. Document Revised: 05/07/2018 Document Reviewed: 05/07/2018 Elsevier Patient Education  2020 [employer]  

## 2020-09-16 NOTE — Assessment & Plan Note (Signed)
Established problem Well Controlled. No signs of complications, medication side effects, or red flags. Continue current medications and other regiments.  

## 2020-09-16 NOTE — Assessment & Plan Note (Signed)
New complaint Histry of similar complaint in right shoulder previously Onset: last few weeks Location: anterolat l shoulder Severity: moderate Function: does not interfere with activities of daily living. 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 (-) cross arm test   A/ Nonspecific left, chronic, shoulder pain - Ddx: muscular origin of pain, tendonopathy, bursitis - Pt does not want to go for PT just now. - Demonstrated two stretching exercises and two strengthen exercises with Theraband. - Pt to notify if not improving or if getting worse

## 2020-09-16 NOTE — Assessment & Plan Note (Signed)
Established problem PHQ9 = 16 More socially withdrawn and inactive secondary to fear about covid and LBP.  DN want change in meds Discuss counseling with her old Social worker.

## 2020-09-16 NOTE — Assessment & Plan Note (Signed)
Established problem Uncontrolled Initial wt loss after dx DM, has regained some but not to previous level Sedentary, in apt most of time. Does not want to go out secondary to Covid and brining it into home and exposing dgt with cancer to virus LBP also impairs her from standing more than few minutes at a time.   Will discuss aquatic therapy once [facility] facility opens.

## 2020-09-16 NOTE — Progress Notes (Signed)
[name] is alone Sources of clinical information for visit is/are patient and past medical records. Nursing assessment for this office visit was reviewed with the patient for accuracy and revision.     Previous Report(s) Reviewed: historical medical records and lab reports  Depression screen [facility] 2/9 09/16/2020  Decreased Interest 3  Down, Depressed, Hopeless 3  PHQ - 2 Score 6  Altered sleeping 2  Tired, decreased energy 3  Change in appetite 2  Feeling bad or failure about yourself  2  Trouble concentrating 1  Moving slowly or fidgety/restless 0  Suicidal thoughts 0  PHQ-9 Score 16  Difficult doing work/chores Extremely dIfficult  Some recent data might be hidden    Fall Risk  09/16/2020 04/01/2020 03/17/2020 07/03/2019 07/02/2019  Falls in the past year? 0 0 0 0 0  Number falls in past yr: - 0 - - -  Injury with Fall? - - - - -  Risk Factor Category  - - - - -  Risk for fall due to : - - - - Impaired balance/gait    PHQ9 SCORE ONLY 09/16/2020 06/10/2020 04/01/2020  PHQ-9 Total Score 16 12 4     Adult vaccines due  Topic Date Due  . TETANUS/TDAP  10/21/2023    Health Maintenance Due  Topic Date Due  . OPHTHALMOLOGY EXAM  Never done  . FOOT EXAM  08/06/2020      History/P.E. limitations: none  Adult vaccines due  Topic Date Due  . TETANUS/TDAP  10/21/2023    Diabetes Health Maintenance Due  Topic Date Due  . OPHTHALMOLOGY EXAM  Never done  . FOOT EXAM  08/06/2020  . HEMOGLOBIN A1C  12/11/2020    Health Maintenance Due  Topic Date Due  . OPHTHALMOLOGY EXAM  Never done  . FOOT EXAM  08/06/2020     Chief Complaint  Patient presents with  . Diabetes    .tm

## 2020-09-24 ENCOUNTER — Encounter: Payer: Self-pay | Admitting: Family Medicine

## 2020-09-28 ENCOUNTER — Other Ambulatory Visit: Payer: Self-pay | Admitting: Family Medicine

## 2020-09-28 DIAGNOSIS — N3946 Mixed incontinence: Secondary | ICD-10-CM

## 2020-09-28 MED ORDER — DARIFENACIN HYDROBROMIDE ER 7.5 MG PO TB24: 7.5000 mg | ORAL_TABLET | Freq: Every day | ORAL | 2 refills | Status: DC

## 2020-09-28 NOTE — Assessment & Plan Note (Signed)
Myrbetriq was working but cost became porhibitive. Trial of Darifenacin 7.5 mg daily, may increase to 15 mg daily after two weeks.

## 2020-10-02 ENCOUNTER — Other Ambulatory Visit: Payer: Self-pay | Admitting: Family Medicine

## 2020-10-02 DIAGNOSIS — I10 Essential (primary) hypertension: Secondary | ICD-10-CM

## 2020-10-04 ENCOUNTER — Encounter: Payer: Self-pay | Admitting: Family Medicine

## 2020-10-04 DIAGNOSIS — M160 Bilateral primary osteoarthritis of hip: Secondary | ICD-10-CM

## 2020-10-04 DIAGNOSIS — M47896 Other spondylosis, lumbar region: Secondary | ICD-10-CM

## 2020-10-04 DIAGNOSIS — M48061 Spinal stenosis, lumbar region without neurogenic claudication: Secondary | ICD-10-CM

## 2020-10-04 DIAGNOSIS — G2581 Restless legs syndrome: Secondary | ICD-10-CM

## 2020-10-04 DIAGNOSIS — G894 Chronic pain syndrome: Secondary | ICD-10-CM

## 2020-10-05 MED ORDER — HYDROCODONE-ACETAMINOPHEN 10-325 MG PO TABS: 1.0000 | ORAL_TABLET | Freq: Four times a day (QID) | ORAL | 0 refills | Status: DC | PRN

## 2020-10-05 MED ORDER — MORPHINE SULFATE ER 15 MG PO TBCR: 15.0000 mg | EXTENDED_RELEASE_TABLET | Freq: Two times a day (BID) | ORAL | 0 refills | Status: DC

## 2020-10-14 ENCOUNTER — Other Ambulatory Visit: Payer: Self-pay | Admitting: Family Medicine

## 2020-10-14 DIAGNOSIS — L219 Seborrheic dermatitis, unspecified: Secondary | ICD-10-CM

## 2020-11-01 ENCOUNTER — Encounter: Payer: Self-pay | Admitting: Family Medicine

## 2020-11-02 ENCOUNTER — Other Ambulatory Visit: Payer: Self-pay | Admitting: Family Medicine

## 2020-11-02 DIAGNOSIS — M48061 Spinal stenosis, lumbar region without neurogenic claudication: Secondary | ICD-10-CM

## 2020-11-02 DIAGNOSIS — G894 Chronic pain syndrome: Secondary | ICD-10-CM

## 2020-11-02 DIAGNOSIS — M160 Bilateral primary osteoarthritis of hip: Secondary | ICD-10-CM

## 2020-11-02 DIAGNOSIS — M47896 Other spondylosis, lumbar region: Secondary | ICD-10-CM

## 2020-11-02 DIAGNOSIS — G2581 Restless legs syndrome: Secondary | ICD-10-CM

## 2020-11-02 MED ORDER — HYDROCODONE-ACETAMINOPHEN 10-325 MG PO TABS: 1.0000 | ORAL_TABLET | Freq: Four times a day (QID) | ORAL | 0 refills | Status: DC | PRN

## 2020-11-02 MED ORDER — MORPHINE SULFATE ER 15 MG PO TBCR: 15.0000 mg | EXTENDED_RELEASE_TABLET | Freq: Two times a day (BID) | ORAL | 0 refills | Status: DC

## 2020-11-04 ENCOUNTER — Other Ambulatory Visit: Payer: Self-pay | Admitting: Family Medicine

## 2020-11-04 DIAGNOSIS — M48061 Spinal stenosis, lumbar region without neurogenic claudication: Secondary | ICD-10-CM

## 2020-11-04 DIAGNOSIS — G894 Chronic pain syndrome: Secondary | ICD-10-CM

## 2020-11-04 DIAGNOSIS — M47896 Other spondylosis, lumbar region: Secondary | ICD-10-CM

## 2020-11-04 MED ORDER — MORPHINE SULFATE ER 15 MG PO TBCR: 15.0000 mg | EXTENDED_RELEASE_TABLET | Freq: Two times a day (BID) | ORAL | 0 refills | Status: DC

## 2020-11-04 NOTE — Telephone Encounter (Signed)
Spoke with [name] at [facility]. Verified that morphine rx was not received. Please resend to [facility] on [address].   Veronda Prude, RN

## 2020-11-16 ENCOUNTER — Other Ambulatory Visit: Payer: Self-pay

## 2020-11-16 ENCOUNTER — Emergency Department (HOSPITAL_COMMUNITY)
Admission: EM | Admit: 2020-11-16 | Discharge: 2020-11-16 | Disposition: A | Discharge status: Home / Self Care | Payer: PPO | Attending: Emergency Medicine | Admitting: Emergency Medicine

## 2020-11-16 ENCOUNTER — Emergency Department (HOSPITAL_COMMUNITY): Payer: PPO

## 2020-11-16 DIAGNOSIS — Z79899 Other long term (current) drug therapy: Secondary | ICD-10-CM | POA: Insufficient documentation

## 2020-11-16 DIAGNOSIS — E039 Hypothyroidism, unspecified: Secondary | ICD-10-CM | POA: Insufficient documentation

## 2020-11-16 DIAGNOSIS — E119 Type 2 diabetes mellitus without complications: Secondary | ICD-10-CM | POA: Insufficient documentation

## 2020-11-16 DIAGNOSIS — K219 Gastro-esophageal reflux disease without esophagitis: Secondary | ICD-10-CM | POA: Insufficient documentation

## 2020-11-16 DIAGNOSIS — K529 Noninfective gastroenteritis and colitis, unspecified: Secondary | ICD-10-CM | POA: Diagnosis not present

## 2020-11-16 DIAGNOSIS — J449 Chronic obstructive pulmonary disease, unspecified: Secondary | ICD-10-CM | POA: Insufficient documentation

## 2020-11-16 DIAGNOSIS — R1084 Generalized abdominal pain: Secondary | ICD-10-CM | POA: Diagnosis present

## 2020-11-16 DIAGNOSIS — I10 Essential (primary) hypertension: Secondary | ICD-10-CM | POA: Diagnosis not present

## 2020-11-16 DIAGNOSIS — K760 Fatty (change of) liver, not elsewhere classified: Secondary | ICD-10-CM | POA: Diagnosis not present

## 2020-11-16 DIAGNOSIS — Z87891 Personal history of nicotine dependence: Secondary | ICD-10-CM | POA: Insufficient documentation

## 2020-11-16 DIAGNOSIS — R109 Unspecified abdominal pain: Secondary | ICD-10-CM | POA: Diagnosis not present

## 2020-11-16 LAB — CBC
HCT: 46 % (ref 36.0–46.0)
Hemoglobin: 15.2 g/dL — ABNORMAL HIGH (ref 12.0–15.0)
MCH: 30.1 pg (ref 26.0–34.0)
MCHC: 33 g/dL (ref 30.0–36.0)
MCV: 91.1 fL (ref 80.0–100.0)
Platelets: 247 10*3/uL (ref 150–400)
RBC: 5.05 MIL/uL (ref 3.87–5.11)
RDW: 12.5 % (ref 11.5–15.5)
WBC: 10.9 10*3/uL — ABNORMAL HIGH (ref 4.0–10.5)
nRBC: 0 % (ref 0.0–0.2)

## 2020-11-16 LAB — COMPREHENSIVE METABOLIC PANEL
ALT: 35 U/L (ref 0–44)
AST: 27 U/L (ref 15–41)
Albumin: 4.3 g/dL (ref 3.5–5.0)
Alkaline Phosphatase: 62 U/L (ref 38–126)
Anion gap: 13 (ref 5–15)
BUN: 12 mg/dL (ref 8–23)
CO2: 23 mmol/L (ref 22–32)
Calcium: 10.2 mg/dL (ref 8.9–10.3)
Chloride: 100 mmol/L (ref 98–111)
Creatinine, Ser: 0.9 mg/dL (ref 0.44–1.00)
GFR, Estimated: >60 mL/min (ref >60)
Glucose, Bld: 187 mg/dL — ABNORMAL HIGH (ref 70–99)
Potassium: 3.7 mmol/L (ref 3.5–5.1)
Sodium: 136 mmol/L (ref 135–145)
Total Bilirubin: 0.7 mg/dL (ref 0.3–1.2)
Total Protein: 7.3 g/dL (ref 6.5–8.1)

## 2020-11-16 LAB — URINALYSIS, ROUTINE W REFLEX MICROSCOPIC
Bilirubin Urine: NEGATIVE
Glucose, UA: NEGATIVE mg/dL
Hgb urine dipstick: NEGATIVE
Ketones, ur: 5 mg/dL — AB
Leukocytes,Ua: NEGATIVE
Nitrite: NEGATIVE
Protein, ur: NEGATIVE mg/dL
Specific Gravity, Urine: 1.039 — ABNORMAL HIGH (ref 1.005–1.030)
pH: 6 (ref 5.0–8.0)

## 2020-11-16 LAB — LIPASE, BLOOD: Lipase: 40 U/L (ref 11–51)

## 2020-11-16 MED ORDER — ONDANSETRON 4 MG PO TBDP: ORAL_TABLET | ORAL | 0 refills | Status: DC

## 2020-11-16 MED ORDER — ACETAMINOPHEN 500 MG PO TABS
1000.0000 mg | ORAL_TABLET | Freq: Once | ORAL | Status: AC
  Administered 2020-11-16: 1000 mg via ORAL
  Filled 2020-11-16: qty 2

## 2020-11-16 MED ORDER — CIPROFLOXACIN HCL 500 MG PO TABS
500.0000 mg | ORAL_TABLET | Freq: Once | ORAL | Status: AC
  Administered 2020-11-16: 500 mg via ORAL
  Filled 2020-11-16: qty 1

## 2020-11-16 MED ORDER — ONDANSETRON HCL 4 MG/2ML IJ SOLN
4.0000 mg | Freq: Once | INTRAMUSCULAR | Status: AC
  Administered 2020-11-16: 4 mg via INTRAVENOUS
  Filled 2020-11-16: qty 2

## 2020-11-16 MED ORDER — CIPROFLOXACIN HCL 500 MG PO TABS: 500.0000 mg | ORAL_TABLET | Freq: Two times a day (BID) | ORAL | 0 refills | Status: DC

## 2020-11-16 MED ORDER — PROCHLORPERAZINE EDISYLATE 10 MG/2ML IJ SOLN
5.0000 mg | Freq: Once | INTRAMUSCULAR | Status: AC
  Administered 2020-11-16: 5 mg via INTRAVENOUS
  Filled 2020-11-16: qty 2

## 2020-11-16 MED ORDER — SODIUM CHLORIDE 0.9 % IV BOLUS
500.0000 mL | Freq: Once | INTRAVENOUS | Status: AC
  Administered 2020-11-16: 500 mL via INTRAVENOUS

## 2020-11-16 MED ORDER — CIPROFLOXACIN IN D5W 400 MG/200ML IV SOLN: 400.0000 mg | Freq: Once | INTRAVENOUS | Status: DC

## 2020-11-16 MED ORDER — MORPHINE SULFATE (PF) 4 MG/ML IV SOLN
4.0000 mg | Freq: Once | INTRAVENOUS | Status: AC
  Administered 2020-11-16: 4 mg via INTRAVENOUS
  Filled 2020-11-16: qty 1

## 2020-11-16 MED ORDER — METRONIDAZOLE 500 MG PO TABS: 500.0000 mg | ORAL_TABLET | Freq: Two times a day (BID) | ORAL | 0 refills | Status: DC

## 2020-11-16 MED ORDER — METRONIDAZOLE IN NACL 5-0.79 MG/ML-% IV SOLN: 500.0000 mg | Freq: Once | INTRAVENOUS | Status: DC

## 2020-11-16 MED ORDER — METRONIDAZOLE 500 MG PO TABS
500.0000 mg | ORAL_TABLET | Freq: Once | ORAL | Status: AC
  Administered 2020-11-16: 500 mg via ORAL
  Filled 2020-11-16: qty 1

## 2020-11-16 MED ORDER — IOHEXOL 300 MG/ML  SOLN
100.0000 mL | Freq: Once | INTRAMUSCULAR | Status: AC | PRN
  Administered 2020-11-16: 100 mL via INTRAVENOUS

## 2020-11-16 MED ORDER — DICYCLOMINE HCL 20 MG PO TABS: 20.0000 mg | ORAL_TABLET | Freq: Two times a day (BID) | ORAL | 0 refills | Status: DC

## 2020-11-16 NOTE — ED Provider Notes (Signed)
[name]   CSN: [id number] Arrival date & time: 11/16/20  1147     History Chief Complaint  Patient presents with  . Constipation  . Abdominal Pain    [name] is a 70 y.o. female.  [name] is a 70 y.o. female  with a history of IBS, sigmoid polyps, diet-controlled diabetes, and chronic pain syndrome who presents to the ER with a 3 week history of watery, mucous stools. Patient feels like she needs to have She is having these watery stools multiple times daily. No hematochezia or melena. She is on hydrocodone and morphine daily without recent change. She has used metamucil daily for two weeks without improvement. This morning she began to have diffuse abdominal pain, nausea, and belching gas. She took compazine and Gas-X without improvement. No episodes of vomiting. She states that she has a diminished appetite. She additionally states that her blood sugar has run high into the 200s today. She endorses a 20 pound weight loss since November 2021. She recalls a similar episode to this in 2012, but does not recall her diagnosis or treatment. She denies fever, chest pain, or shortness of breath.        Past Medical History:  Diagnosis Date  . Adrenal nodule ([place]) 04/08/2011   04/08/11 Abdominal CT showed Left adrenal nodule which is tachnically indeterminate but most likely an adenoma.  It is 1.7 cm and 42 HU on portal venous phase image. 31 HU on delayed images.   01/09/2012 Abdominal CT w/ & w/o CM: Stable benign left adrenal adenoma. No further specific follow-up is needed for this finding.    . Allergic rhinitis 01/03/2007   Qualifier: Diagnosis of  By: Drucie Ip    . Allergy   . Anemia    hx of  in 20's  . Anxiety   . Bipolar disorder ([place])   . Carpal tunnel syndrome 02/14/2007   S/P carpel tunnel release bilaterally.    . Cataracts, bilateral    removed both eyes  2017-2018  . Chronic pain syndrome 12/24/2008   . 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 COPD WITHOUT EXACERBATION 01/03/2007   Qualifier: Diagnosis of  By: Drucie Ip  emphysema  . Coronary artery calcification seen on CAT scan 12/03/2017   11/2017 Chest CT: Left anterior descending and right coronary atherosclerosis  . Diabetes mellitus without complication (HCC)    [id number] 6.5 as of 05-2019- diet changes, no meds currently   . DISC WITH RADICULOPATHY 01/03/2007   Qualifier: Diagnosis of  By: Drucie Ip    . Emphysema of lung ([place])   . Esophageal reflux 01/03/2007   Centricity Description: GASTROESOPHAGEAL REFLUX, NO ESOPHAGITIS Qualifier: Diagnosis of  By: Drucie Ip   Centricity Description: GERD Qualifier: Diagnosis of  By: Tye Savoy MD, Tommi Rumps    . External hemorrhoids s/p hemorrhoidectomy 03/28/2018 03/28/2018  . Fibromyalgia   . Fracture of bone spur of inferior portion of calcaneus 10/06/2018  . Functional gastrointestinal disorder 06/18/2008   Functional Gastrointestinal Disorder with frequent eructation and bloating sensation with acute flares.  Responsive to Compazine and Bentyl.  Takes a few days to calm down.    Marland Kitchen GERD without esophagitis 01/03/2007   Centricity Description: GASTROESOPHAGEAL REFLUX, NO ESOPHAGITIS Qualifier: Diagnosis of  By: Drucie Ip   Centricity Description: GERD Qualifier: Diagnosis of  By: Tye Savoy MD, Tommi Rumps    . Heel spur, fracture, left 04/17/2018  . HEMORRHOIDS, NOS  01/03/2007   Qualifier: Diagnosis of  By: Drucie Ip    . Hepatic steatosis 04/12/2011  . Herpes simplex labialis 05/11/2011  . Hip osteoarthritis 05/23/2007   MRI Pelvis (03/13/07) Bilateral osteoarthritis of the hips, left greater than right.  The  patient has a slightly more prominent hip effusion on the left than  the right.  No other significant abnormality.    . History of MRSA infection 04/28/2011  . HYPERCHOLESTEROLEMIA 12/24/2008  . Hypertension   . HYPERTENSION, BENIGN  SYSTEMIC 01/03/2007   Qualifier: Diagnosis of  By: Drucie Ip    . HYPERTRIGLYCERIDEMIA 11/11/2007   Qualifier: Diagnosis of  By: McDiarmid MD, Sherren Mocha    . Hypokalemia 12/12/2017  . Hypothyroidism 02/15/2007   Qualifier: Diagnosis of  By: McDiarmid MD, Sherren Mocha    . HYPOTHYROIDISM, BORDERLINE 02/15/2007  . Irritable bowel syndrome 01/03/2007   Qualifier: Diagnosis of  By: Drucie Ip    . LACTOSE INTOLERANCE 03/10/2008  . Major depressive disorder, recurrent episode ([place]) 01/03/2007   Qualifier: Diagnosis of  By: Drucie Ip    . Medication management 12/14/2016   Managing topiramate for weight loss starting 12/14/16  . Obesity (BMI 30.0-34.9) 09/30/2007   Has lost 55 lbs since easter. Goal of <190 by 08/05/12.     . Obesity, Class III, BMI 40-49.9 (morbid obesity) ([place]) 09/30/2007       . Osteoarthritis of spine 01/03/2007   MR I Lumbar (08/03): GeneralDDD; L2-3 Large  HNP.  Mod pos  hnp - 12/14/2005, smhnpw/irritL4root - 12/14/2005    . OSTEOARTHRITIS, HANDS, BILATERAL 06/18/2008   Qualifier: Diagnosis of  By: McDiarmid MD, Sherren Mocha    . PATELLO FEMORAL STRESS SYNDROME 01/03/2007   Qualifier: Diagnosis of  By: Drucie Ip    . Periodic limb movements of sleep 12/05/2010   Diagnosed on Polysomnography testing Fall 2011.     Marland Kitchen Pneumonia    walking pneumonia in the 90's  . Pre-diabetes 12/25/2008   Qualifier: Diagnosis of  By: McDiarmid MD, Sherren Mocha    . Prolapsed internal hemorrhoids, grade 3, s/p ligation/pexy/hemorrhoidectomy 03/28/2018 03/28/2018  . RESTLESS LEGS SYNDROME 09/11/2007   Polysomnography (10/2010, Dr Keturah Barre, interpreter. ) showed periodic limb movements that frequently awoke patient from sleep with arousal index of 4 per hour. Dr Danton Sewer (Sleep Specialist) thought her RLS was the major contributor to patient poor sleep condition, more than any sleep apnea.  He recommended considering opamine agonist (either requip or mirapex) to see if this will help.     . Rosacea, acne 10/24/2011   . SIGMOID POLYP 01/23/2008  . Solitary lung nodule 04/08/2011  . Stage 1 mild COPD by GOLD classification ([place]) 01/03/2007   11/28/2017 Chest CT: Mild centrilobular and paraseptal emphysema with mild diffuse bronchial wall thickening    . Stress incontinence   . Synovial cyst of lumbar facet joint 02/17/2016  . Trigger thumb of left hand 08/16/2018  . Urticaria, chronic 05/22/2011  . VITAMIN D DEFICIENCY 03/03/2010  . Work-related condition 05/17/2018    Patient Active Problem List   Diagnosis Date Noted  . Left shoulder pain 09/16/2020  . Urinary incontinence, mixed 04/02/2020  . Muscle spasm 01/05/2020  . Diabetes mellitus type 2 in obese ([place]) 06/30/2019  . Type 2 diabetes mellitus without complications ([place]) 76/16/0737  . Spinal stenosis of lumbar region 06/21/2016  . Encounter for chronic pain management 12/03/2014  . Metabolic syndrome 10/62/6948  . Periodic limb movements of sleep 12/05/2010  . VITAMIN D DEFICIENCY 03/03/2010  .  HYPERCHOLESTEROLEMIA 12/24/2008  . Chronic pain syndrome 12/24/2008  . Functional gastrointestinal disorder 06/18/2008  . SIGMOID POLYP 01/23/2008  . HYPERTRIGLYCERIDEMIA 11/11/2007  . Obesity, Class III, BMI 40-49.9 (morbid obesity) ([place]) 09/30/2007  . RESTLESS LEGS SYNDROME 09/11/2007  . Fibromyalgia syndrome 07/15/2007  . Hip osteoarthritis 05/23/2007  . Hypothyroidism 02/15/2007  . Bipolar 2 disorder ([place]) 01/03/2007  . Hypertension associated with diabetes ([place]) 01/03/2007  . Allergic rhinitis 01/03/2007  . GERD without esophagitis 01/03/2007  . Irritable bowel syndrome 01/03/2007  . Osteoarthritis of spine 01/03/2007    Past Surgical History:  Procedure Laterality Date  . APPENDECTOMY    . bladder tack  1998   Dr Jeffie Pollock (urology)  . BUNIONECTOMY  1992   bilateral feet with pins in great toes  . CARDIAC CATHETERIZATION    . CARPAL TUNNEL RELEASE  2010   Bilateral, Dr Theodis Sato  . COLONOSCOPY  2004   Dr Verdia Kuba (GI)  . CYSTOCELE  REPAIR  1998    Dr Gertie Fey (GYN)-  . ESOPHAGOGASTRODUODENOSCOPY    . EVALUATION UNDER ANESTHESIA WITH HEMORRHOIDECTOMY N/A 03/28/2018   Procedure: ANORECTAL EXAM UNDER ANESTHESIA lateral internal partial sphincterotomy, hemorrhoidal ligation pixie, HEMORRHOIDECTOMY;  Surgeon: Michael Boston, MD;  Location: [facility];  Service: General;  Laterality: N/A;  . EXAM ANORECTAL W/ ULTRASOUND     Dr. Johney Maine 03-28-18   . EXPLORATORY LAPAROTOMY    . FACIAL LACERATIONS REPAIR     Facial Laceration repair as adolescent   . INJECTION HIP INTRA ARTICULAR  2008   Left Hip fluoroscopically-guided corticosteorid injection for painful osteoarthritis with good effect (06/2007)  . LAPAROSCOPY FOR ECTOPIC PREGNANCY    . LUMBAR LAMINECTOMY/DECOMPRESSION MICRODISCECTOMY N/A 06/21/2016   Procedure: MICRO LUMBAR DECOMPRESSION L4-L5 AND REMOVAL OF SYNOVIAL CYST    1 LEVEL;  Surgeon: Susa Day, MD;  Location: [facility];  Service: Orthopedics;  Laterality: N/A;  . NM MYOVIEW LTD  2011   Dr Daneen Schick, III (Card)  . RECTOCELE REPAIR  1998   Dr Gertie Fey (GYN)  . SPHINCTEROTOMY N/A 03/28/2018   Procedure: ANAL SPHINCTEROTOMY;  Surgeon: Michael Boston, MD;  Location: [facility];  Service: General;  Laterality: N/A;  . UPPER GASTROINTESTINAL ENDOSCOPY       OB History   No obstetric history on file.     Family History  Problem Relation Age of Onset  . Diabetes Father   . Heart disease Father   . Diabetes type II Other   . Hypertension Other   . Heart attack Other   . Alcohol abuse Other   . Depression Other   . Asthma Other   . Migraines Other   . Hypertension Mother   . Hypertension Sister   . Obesity Sister   . Kidney disease Brother   . Hypertension Daughter   . Hypertension Son   . Heart disease Brother   . Hypertension Brother   . Bipolar disorder Daughter   . Hypertension Daughter   . Hypertension Son   . Colon polyps Son   . Esophageal cancer Neg Hx   . Stomach cancer Neg Hx   . Rectal cancer Neg Hx    . Colon cancer Neg Hx     Social History   Tobacco Use  . Smoking status: Former Smoker    Packs/day: 1.50    Years: 44.00    Pack years: 66.00    Types: Cigarettes    Quit date: 04/06/2008    Years since quitting: 12.6  . Smokeless  tobacco: Never Used  Vaping Use  . Vaping Use: Never used  Substance Use Topics  . Alcohol use: No  . Drug use: No    Comment: hx of marijuana use - last used 2 months ago     Home Medications Prior to Admission medications   Medication Sig Start Date End Date Taking? Authorizing Provider  ciprofloxacin (CIPRO) 500 MG tablet Take 1 tablet (500 mg total) by mouth 2 (two) times daily. One po bid x 7 days 11/16/20  Yes Jacqlyn Larsen, PA-C  dicyclomine (BENTYL) 20 MG tablet Take 1 tablet (20 mg total) by mouth 2 (two) times daily. 11/16/20  Yes Jacqlyn Larsen, PA-C  metroNIDAZOLE (FLAGYL) 500 MG tablet Take 1 tablet (500 mg total) by mouth 2 (two) times daily. One po bid x 7 days 11/16/20  Yes Jacqlyn Larsen, PA-C  ondansetron (ZOFRAN ODT) 4 MG disintegrating tablet 4mg  ODT q4 hours prn nausea/vomit 11/16/20  Yes Ellise Kovack N, PA-C  atorvastatin (LIPITOR) 20 MG tablet TAKE 1 TABLET BY MOUTH EVERY DAY 07/05/20   McDiarmid, Blane Ohara, MD  Calcium Carb-Cholecalciferol (CALCIUM 1000 + D) 1000-800 MG-UNIT TABS Take by mouth.    [provider]  citalopram (CELEXA) 40 MG tablet Take 0.5 tablets (20 mg total) by mouth at bedtime. Patient taking differently: Take 40 mg by mouth daily.  06/22/16   Cecilie Kicks, PA-C  Continuous Blood Gluc Sensor (FREESTYLE LIBRE 14 DAY SENSOR) MISC 1 Device by Does not apply route every 14 (fourteen) days. 11/13/19   McDiarmid, Blane Ohara, MD  darifenacin (ENABLEX) 7.5 MG 24 hr tablet Take 1 tablet (7.5 mg total) by mouth daily. 09/28/20   McDiarmid, Blane Ohara, MD  fexofenadine (ALLEGRA) 180 MG tablet Take 180 mg by mouth daily.    [provider]  fluticasone (FLONASE) 50 MCG/ACT nasal spray Place 2 sprays into both  nostrils daily.    [provider]  FLUZONE HIGH-DOSE QUADRIVALENT 0.7 ML SUSY  08/26/20   [provider]  hydrochlorothiazide (HYDRODIURIL) 25 MG tablet Take 1 tablet (25 mg total) by mouth daily. 02/11/20   McDiarmid, Blane Ohara, MD  HYDROcodone-acetaminophen (NORCO) 10-325 MG tablet Take 1 tablet by mouth every 6 (six) hours as needed. 11/04/20 12/04/20  McDiarmid, Blane Ohara, MD  hydrOXYzine (VISTARIL) 50 MG capsule Take 150 mg by mouth at bedtime.  09/02/15   [provider]  ketoconazole (NIZORAL) 2 % cream APPLY TOPICALLY TO AFFECTED AREA EVERY DAY 10/14/20   McDiarmid, Blane Ohara, MD  Lactobacillus-Inulin (CULTURELLE DIGESTIVE DAILY PO) Take by mouth.    [provider]  levothyroxine (SYNTHROID) 125 MCG tablet Take 1 tablet (125 mcg total) by mouth every morning. 30 minutes before food 05/06/20   McDiarmid, Blane Ohara, MD  LORazepam (ATIVAN) 1 MG tablet Take 1 mg by mouth 3 (three) times daily.     [provider]  metoprolol tartrate (LOPRESSOR) 50 MG tablet TAKE 1 TABLET BY MOUTH TWICE A DAY 10/04/20   McDiarmid, Blane Ohara, MD  morphine (MS CONTIN) 15 MG 12 hr tablet Take 1 tablet (15 mg total) by mouth every 12 (twelve) hours. 11/04/20 12/04/20  McDiarmid, Blane Ohara, MD  Multiple Minerals-Vitamins (CALCIUM-MAGNESIUM-ZINC-D3) TABS Take 3 tablets by mouth daily.    [provider]  nitroGLYCERIN (NITROSTAT) 0.4 MG SL tablet Place 1 tablet (0.4 mg total) under the tongue every 5 (five) minutes as needed for chest pain. 08/15/18   McDiarmid, Blane Ohara, MD  omeprazole (PRILOSEC) 40  MG capsule Take 1 capsule (40 mg total) by mouth daily. 01/01/20   McDiarmid, Blane Ohara, MD  polyethylene glycol (MIRALAX) powder Take 17 g by mouth as needed for mild constipation.     [provider]  potassium chloride (KLOR-CON M10) 10 MEQ tablet Take 4 tablets (40 mEq total) by mouth at bedtime. 03/15/20   McDiarmid, Blane Ohara, MD  prazosin (MINIPRESS) 1 MG capsule Take 1 capsule (1 mg  total) by mouth at bedtime. 05/28/14   McDiarmid, Blane Ohara, MD  prochlorperazine (COMPAZINE) 10 MG tablet TAKE ONE TABLET BY MOUTH TWICE A DAY AS NEEDED FOR NAUSEA OR VOMITING 07/14/20   McDiarmid, Blane Ohara, MD  ramipril (ALTACE) 10 MG capsule Take 1 capsule (10 mg total) by mouth daily. 02/11/20   McDiarmid, Blane Ohara, MD  Simethicone (GAS-X PO) Take 2 tablets by mouth daily as needed (gas).    [provider]  tiZANidine (ZANAFLEX) 2 MG tablet TAKE 1 TABLET (2 MG TOTAL) BY MOUTH AT BEDTIME. 09/06/20   McDiarmid, Blane Ohara, MD  traZODone (DESYREL) 100 MG tablet  09/20/20   [provider]  vitamin B-12 (CYANOCOBALAMIN) 1000 MCG tablet Take 1,000 mcg by mouth daily.    [provider]    Allergies    Diclofenac-misoprostol, Aripiprazole, Emsam [selegiline], Estradiol, Ibuprofen, Naproxen, Neurontin [gabapentin], Paroxetine, Prednisone, and Tape  Review of Systems   Review of Systems  Constitutional: Negative for chills and fever.  HENT: Negative.   Respiratory: Negative for cough and shortness of breath.   Cardiovascular: Negative for chest pain.  Gastrointestinal: Positive for abdominal distention, abdominal pain and diarrhea. Negative for blood in stool, nausea and vomiting.  Genitourinary: Negative for dysuria and frequency.  Musculoskeletal: Negative for arthralgias and myalgias.  Skin: Negative for color change and rash.  Neurological: Negative for dizziness, syncope and light-headedness.    Physical Exam Updated Vital Signs BP (!) 170/91 (BP Location: Right Arm)   Pulse 97   Temp 98 F (36.7 C) (Oral)   Resp (!) 22   SpO2 98%   Physical Exam Vitals and nursing Hernandez reviewed.  Constitutional:      General: She is not in acute distress.    Appearance: She is well-developed and well-nourished. She is obese. She is ill-appearing. She is not diaphoretic.     Comments: Pt is alert, somewhat ill-appearing, but in no acute distress  HENT:     Head: Normocephalic and  atraumatic.     Mouth/Throat:     Mouth: Oropharynx is clear and moist. Mucous membranes are moist.     Pharynx: Oropharynx is clear.  Eyes:     General:        Right eye: No discharge.        Left eye: No discharge.     Extraocular Movements: EOM normal.  Cardiovascular:     Rate and Rhythm: Normal rate and regular rhythm.     Pulses: Intact distal pulses.     Heart sounds: Normal heart sounds.  Pulmonary:     Effort: Pulmonary effort is normal. No respiratory distress.     Breath sounds: Normal breath sounds. No wheezing or rales.     Comments: Respirations equal and unlabored, patient able to speak in full sentences, lungs clear to auscultation bilaterally  Abdominal:     General: Bowel sounds are normal. There is distension.     Palpations: Abdomen is soft. There is no mass.     Tenderness: There is generalized abdominal tenderness. There  is no guarding.     Comments: Abdomen is mildly distended, but soft, bowel sounds present but hypoactive, abdomen with mild generalized tenderness throughout, no focal pain or guarding  Genitourinary:    Comments: Chaperone present On rectal exam no gross blood, watery brown stool present, no palpable fecal impaction Musculoskeletal:        General: No deformity or edema.     Cervical back: Neck supple.  Skin:    General: Skin is warm and dry.     Capillary Refill: Capillary refill takes less than 2 seconds.  Neurological:     Mental Status: She is alert.     Coordination: Coordination normal.     Comments: Speech is clear, able to follow commands Moves extremities without ataxia, coordination intact  Psychiatric:        Mood and Affect: Mood normal.        Behavior: Behavior normal.     ED Results / Procedures / Treatments   Labs (all labs ordered are listed, but only abnormal results are displayed) Labs Reviewed  COMPREHENSIVE METABOLIC PANEL - Abnormal; Notable for the following components:      Result Value   Glucose, Bld 187  (*)    All other components within normal limits  CBC - Abnormal; Notable for the following components:   WBC 10.9 (*)    Hemoglobin 15.2 (*)    All other components within normal limits  URINALYSIS, ROUTINE W REFLEX MICROSCOPIC - Abnormal; Notable for the following components:   Specific Gravity, Urine 1.039 (*)    Ketones, ur 5 (*)    All other components within normal limits  LIPASE, BLOOD    EKG None  Radiology CT ABDOMEN PELVIS W CONTRAST  Result Date: 11/16/2020 CLINICAL DATA:  Abdominal pain. EXAM: CT ABDOMEN AND PELVIS WITH CONTRAST TECHNIQUE: Multidetector CT imaging of the abdomen and pelvis was performed using the standard protocol following bolus administration of intravenous contrast. CONTRAST:  186mL OMNIPAQUE IOHEXOL 300 MG/ML  SOLN COMPARISON:  January 25, 2012 FINDINGS: Lower chest: There is a trace right-sided pleural effusion.There is mild cardiomegaly. Hepatobiliary: There is decreased hepatic attenuation suggestive of hepatic steatosis. Normal gallbladder.There is no biliary ductal dilation. Pancreas: Normal contours without ductal dilatation. No peripancreatic fluid collection. Spleen: Unremarkable. Adrenals/Urinary Tract: --Adrenal glands: There is a left adrenal mass measuring approximately 2.7 cm. This is minimally increased in size from prior study and is consistent with a benign adrenal adenoma. --Right kidney/ureter: No hydronephrosis or radiopaque kidney stones. --Left kidney/ureter: No hydronephrosis or radiopaque kidney stones. --Urinary bladder: Unremarkable. Stomach/Bowel: --Stomach/Duodenum: No hiatal hernia or other gastric abnormality. Normal duodenal course and caliber. --Small bowel: Unremarkable. --Colon: There is apparent diffuse circumferential wall thickening of the transverse colon and portions of the ascending colon. --Appendix: Not visualized. No right lower quadrant inflammation or free fluid. Vascular/Lymphatic: Atherosclerotic calcification is present  within the non-aneurysmal abdominal aorta, without hemodynamically significant stenosis. --No retroperitoneal lymphadenopathy. --No mesenteric lymphadenopathy. --No pelvic or inguinal lymphadenopathy. Reproductive: Unremarkable Other: No ascites or free air. The abdominal wall is normal. Musculoskeletal. Multilevel degenerative changes are noted throughout the lumbar spine without evidence for an acute compression fracture. There are degenerative changes of both hips. IMPRESSION: 1. Apparent diffuse circumferential wall thickening of the transverse colon and portions of the ascending colon, which may be seen in the setting of colitis. 2. Trace right-sided pleural effusion. 3. Hepatic steatosis. Aortic Atherosclerosis (ICD10-I70.0). Electronically Signed   By: Constance Holster M.D.   On: 11/16/2020 19:57  Procedures Procedures (including critical care time)  Medications Ordered in ED Medications  prochlorperazine (COMPAZINE) injection 5 mg (5 mg Intravenous Given 11/16/20 1901)  morphine 4 MG/ML injection 4 mg (4 mg Intravenous Given 11/16/20 1901)  sodium chloride 0.9 % bolus 500 mL (0 mLs Intravenous Stopped 11/16/20 1956)  iohexol (OMNIPAQUE) 300 MG/ML solution 100 mL (100 mLs Intravenous Contrast Given 11/16/20 1948)  ondansetron (ZOFRAN) injection 4 mg (4 mg Intravenous Given 11/16/20 2026)  ciprofloxacin (CIPRO) tablet 500 mg (500 mg Oral Given 11/16/20 2025)  metroNIDAZOLE (FLAGYL) tablet 500 mg (500 mg Oral Given 11/16/20 2027)  acetaminophen (TYLENOL) tablet 1,000 mg (1,000 mg Oral Given 11/16/20 2055)    ED Course  I have reviewed the triage vital signs and the nursing notes.  Pertinent labs & imaging results that were available during my care of the patient were reviewed by me and considered in my medical decision making (see chart for details).    MDM Rules/Calculators/A&P                         Patient presents to the ED with complaints of abdominal pain and distention, and concern  for constipation because patient has only been able past loose watery stools and feels that she needs to have a larger bowel movement. Patient somewhat ill-appearing and uncomfortable but nontoxic, in no apparent distress, vitals stable, patient mildly hypertensive. On exam patient tender to palpation diffusely with mild abdominal distention noted, no peritoneal signs. Will evaluate with labs and CT abdomen pelvis. Analgesics, anti-emetics, and fluids administered.   I have independently ordered, reviewed and interpreted all labs and imaging: CBC: Mild leukocytosis of 10.9, slightly elevated hemoglobin, likely in the setting of hemoconcentration CMP: Glucose of 187 but no other electrolyte derangements, normal renal and liver function Lipase: WNL UA: No hematuria or evidence of infection  Imaging: CT with apparent diffuse circumferential wall thickening of the transverse colon and portions of the ascending colon which likely indicates colitis, trace right-sided pleural effusion noted, no other acute intra-abdominal findings.  Colitis would certainly explain patient's frequent loose and watery bowel movements.  On reevaluation after symptomatic treatment patient appears much more comfortable, she is no longer persistently belching and is tolerating p.o.  Will treat colitis with Cipro and Flagyl, patient able to tolerate first doses of p.o. antibiotics here in the emergency department.  Will treat with Zofran for nausea and Bentyl for cramping abdominal pain.Will have patient follow-up closely with her PCP.  Strict return precautions discussed.  Patient expresses understanding and agreement. Discharged home in good condition.     Final Clinical Impression(s) / ED Diagnoses Final diagnoses:  Colitis    Rx / DC Orders ED Discharge Orders         Ordered    ciprofloxacin (CIPRO) 500 MG tablet  2 times daily        11/16/20 2143    metroNIDAZOLE (FLAGYL) 500 MG tablet  2 times daily         11/16/20 2143    dicyclomine (BENTYL) 20 MG tablet  2 times daily        11/16/20 2143    ondansetron (ZOFRAN ODT) 4 MG disintegrating tablet        11/16/20 2143           Jacqlyn Larsen, PA-C 11/20/20 2233    Drenda Freeze, MD 11/23/20 [id number]

## 2020-11-16 NOTE — ED Triage Notes (Signed)
Pt without bowel movement x 3 weeks. Feels nauseas but is not vomiting. Does take morphine and percocet daily. Is belching a lot.

## 2020-11-16 NOTE — ED Notes (Signed)
Pt tolerated a cup of ginger ale with no n/v

## 2020-11-16 NOTE — Discharge Instructions (Addendum)
Your CT scan shows colitis, this is probably the cause of the frequent watery stools you have been having over the past few weeks.  Please take antibiotics as prescribed for the next week, make sure you take these with food on your stomach.  You can also use Zofran as well as your home nausea medication and you can use Bentyl to help with cramping, abdominal pain and bloating, you can also take over-the-counter Gas-X.    I would like for you to follow-up closely with your PCP and GI doctor.    If you develop fevers, vomiting and are unable to keep down your antibiotics, blood in your stool, worsening abdominal pain or any other new or concerning symptoms please return for reevaluation.

## 2020-11-16 NOTE — ED Notes (Signed)
Pt given a cup of ginger ale for fluid challenge

## 2020-11-18 DIAGNOSIS — F3181 Bipolar II disorder: Secondary | ICD-10-CM | POA: Diagnosis not present

## 2020-11-18 DIAGNOSIS — F4312 Post-traumatic stress disorder, chronic: Secondary | ICD-10-CM | POA: Diagnosis not present

## 2020-11-27 ENCOUNTER — Other Ambulatory Visit: Payer: Self-pay | Admitting: Family Medicine

## 2020-11-27 DIAGNOSIS — M797 Fibromyalgia: Secondary | ICD-10-CM

## 2020-11-27 DIAGNOSIS — I10 Essential (primary) hypertension: Secondary | ICD-10-CM

## 2020-11-29 MED ORDER — RAMIPRIL 10 MG PO CAPS: ORAL_CAPSULE | ORAL | 3 refills | Status: DC

## 2020-11-29 MED ORDER — TIZANIDINE HCL 2 MG PO TABS: 2.0000 mg | ORAL_TABLET | Freq: Every day | ORAL | 0 refills | Status: DC

## 2020-11-29 NOTE — Addendum Note (Signed)
Addended by: Talbot Grumbling on: 11/29/2020 09:30 AM   Modules accepted: Orders

## 2020-11-30 MED ORDER — RAMIPRIL 10 MG PO CAPS: ORAL_CAPSULE | ORAL | 3 refills | Status: DC

## 2020-11-30 MED ORDER — TIZANIDINE HCL 2 MG PO TABS: 2.0000 mg | ORAL_TABLET | Freq: Every day | ORAL | 0 refills | Status: DC

## 2020-11-30 NOTE — Addendum Note (Signed)
Addended by: Talbot Grumbling on: 11/30/2020 01:46 PM   Modules accepted: Orders

## 2020-11-30 NOTE — Telephone Encounter (Signed)
Original rx-transmission to pharmacy failed. Resending per original order.   Josiane Labine C Anastasija Anfinson, RN  

## 2020-12-02 ENCOUNTER — Ambulatory Visit (INDEPENDENT_AMBULATORY_CARE_PROVIDER_SITE_OTHER): Payer: PPO | Admitting: Family Medicine

## 2020-12-02 ENCOUNTER — Encounter: Payer: Self-pay | Admitting: Family Medicine

## 2020-12-02 ENCOUNTER — Other Ambulatory Visit: Payer: Self-pay

## 2020-12-02 VITALS — BP 120/70 | HR 84 | Ht 64.0 in | Wt 250.0 lb

## 2020-12-02 DIAGNOSIS — K580 Irritable bowel syndrome with diarrhea: Secondary | ICD-10-CM | POA: Diagnosis not present

## 2020-12-02 DIAGNOSIS — M48061 Spinal stenosis, lumbar region without neurogenic claudication: Secondary | ICD-10-CM

## 2020-12-02 DIAGNOSIS — Z9189 Other specified personal risk factors, not elsewhere classified: Secondary | ICD-10-CM | POA: Diagnosis not present

## 2020-12-02 DIAGNOSIS — I700 Atherosclerosis of aorta: Secondary | ICD-10-CM

## 2020-12-02 DIAGNOSIS — M47896 Other spondylosis, lumbar region: Secondary | ICD-10-CM

## 2020-12-02 DIAGNOSIS — K760 Fatty (change of) liver, not elsewhere classified: Secondary | ICD-10-CM

## 2020-12-02 DIAGNOSIS — E119 Type 2 diabetes mellitus without complications: Secondary | ICD-10-CM | POA: Diagnosis not present

## 2020-12-02 DIAGNOSIS — F3181 Bipolar II disorder: Secondary | ICD-10-CM | POA: Diagnosis not present

## 2020-12-02 DIAGNOSIS — I152 Hypertension secondary to endocrine disorders: Secondary | ICD-10-CM

## 2020-12-02 DIAGNOSIS — K529 Noninfective gastroenteritis and colitis, unspecified: Secondary | ICD-10-CM | POA: Diagnosis not present

## 2020-12-02 DIAGNOSIS — D3502 Benign neoplasm of left adrenal gland: Secondary | ICD-10-CM

## 2020-12-02 DIAGNOSIS — G8929 Other chronic pain: Secondary | ICD-10-CM | POA: Diagnosis not present

## 2020-12-02 DIAGNOSIS — E1159 Type 2 diabetes mellitus with other circulatory complications: Secondary | ICD-10-CM | POA: Diagnosis not present

## 2020-12-02 DIAGNOSIS — M160 Bilateral primary osteoarthritis of hip: Secondary | ICD-10-CM

## 2020-12-02 DIAGNOSIS — F1190 Opioid use, unspecified, uncomplicated: Secondary | ICD-10-CM

## 2020-12-02 DIAGNOSIS — G894 Chronic pain syndrome: Secondary | ICD-10-CM

## 2020-12-02 DIAGNOSIS — G2581 Restless legs syndrome: Secondary | ICD-10-CM

## 2020-12-02 HISTORY — DX: Benign neoplasm of left adrenal gland: D35.02

## 2020-12-02 LAB — POCT GLYCOSYLATED HEMOGLOBIN (HGB A1C): Hemoglobin A1C: 6.2 % — AB (ref 4.0–5.6)

## 2020-12-02 MED ORDER — CIPROFLOXACIN HCL 500 MG PO TABS: 500.0000 mg | ORAL_TABLET | Freq: Two times a day (BID) | ORAL | 0 refills | Status: AC

## 2020-12-02 MED ORDER — METRONIDAZOLE 500 MG PO TABS: 500.0000 mg | ORAL_TABLET | Freq: Two times a day (BID) | ORAL | 0 refills | Status: AC

## 2020-12-02 MED ORDER — DICYCLOMINE HCL 20 MG PO TABS: 20.0000 mg | ORAL_TABLET | Freq: Three times a day (TID) | ORAL | 0 refills | Status: DC | PRN

## 2020-12-02 NOTE — Patient Instructions (Addendum)
Since you did not receive both the recommended antibiotics from the emergency room physician and you continue to have diarrhea, take both ciprofloxacin 500 mg tablet twice a day and metronidazole 500 mg twice a day for 5 days.   Stop taking the Miralax (polyethelene glycol) powder until the diarrhea has resolved.   We will arrange referral to the [facility] at [facility] at [facility].   We will arrange a visit with Voncille Lo, Physical Therapist, to consider aquatic therapy for rehabilitation.   A prescription for Naloxone (Narcan) was sent to your pharmacy.  This is an antidote should you breathing be suppressed by the [place] Contin.  Please show your daughter how to administer the medication to you in case you should ever stop breathing or nearly stop breathing.

## 2020-12-02 NOTE — Progress Notes (Addendum)
[name] is alone Sources of clinical information for visit is/are patient and past medical records. Nursing assessment for this office visit was reviewed with the patient for accuracy and revision.     Previous Report(s) Reviewed: ER records, historical medical records, lab reports and radiology reports  Depression screen [facility] 2/9 12/02/2020  Decreased Interest 3  Down, Depressed, Hopeless 1  PHQ - 2 Score 4  Altered sleeping 2  Tired, decreased energy 2  Change in appetite 1  Feeling bad or failure about yourself  1  Trouble concentrating 2  Moving slowly or fidgety/restless 0  Suicidal thoughts 0  PHQ-9 Score 12  Difficult doing work/chores -  Some recent data might be hidden    Fall Risk  12/02/2020 09/16/2020 04/01/2020 03/17/2020 07/03/2019  Falls in the past year? 0 0 0 0 0  Number falls in past yr: 0 - 0 - -  Injury with Fall? 0 - - - -  Risk Factor Category  - - - - -  Risk for fall due to : Impaired balance/gait;Impaired mobility - - - -  Follow up Falls evaluation completed - - - -    PHQ9 SCORE ONLY 12/02/2020 09/16/2020 06/10/2020  PHQ-9 Total Score 12 16 12     Adult vaccines due  Topic Date Due   TETANUS/TDAP  10/21/2023    There are no preventive care reminders to display for this patient.    History/P.E. limitations: none  Adult vaccines due  Topic Date Due   TETANUS/TDAP  10/21/2023    Diabetes Health Maintenance Due  Topic Date Due   HEMOGLOBIN A1C  06/01/2021   OPHTHALMOLOGY EXAM  08/26/2021   FOOT EXAM  09/16/2021   There are no preventive care reminders to display for this patient.   Chief Complaint  Patient presents with   Hospitalization Follow-up    Colitis - still having diarrhea   Diabetes        Follow-up for ED visit  ED visit with CC diarrhea 11/16/20 [name] is a 70 y.o. female  with a history of IBS, sigmoid polyps, diet-controlled diabetes, and chronic pain syndrome who presents to the ER with a 3 week  history of watery, mucous stools. Patient feels like she needs to have She is having these watery stools multiple times daily. No hematochezia or melena. She is on hydrocodone and morphine daily without recent change. She has used metamucil daily for two weeks without improvement. This morning she began to have diffuse abdominal pain, nausea, and belching gas. She took compazine and Gas-X without improvement. No episodes of vomiting. She states that she has a diminished appetite. She additionally states that her blood sugar has run high into the 200s today. She endorses a 20 pound weight loss since November 2021. She recalls a similar episode to this in 2012, but does not recall her diagnosis or treatment. She denies fever, chest pain, or shortness of breath.  Labs (all labs ordered are listed, but only abnormal results are displayed)      Labs Reviewed  COMPREHENSIVE METABOLIC PANEL - Abnormal; Notable for the following components:      Result Value     Glucose, Bld 187 (*)      All other components within normal limits  CBC - Abnormal; Notable for the following components:    WBC 10.9 (*)      Hemoglobin 15.2 (*)      All other components within normal limits  URINALYSIS, ROUTINE W  REFLEX MICROSCOPIC - Abnormal; Notable for the following components:    Specific Gravity, Urine 1.039 (*)      Ketones, ur 5 (*)      All other components within normal limits  LIPASE, BLOOD   CT ABDOMEN PELVIS W CONTRAST   Result Date: 11/16/2020 CLINICAL DATA:  Abdominal pain. EXAM: CT ABDOMEN AND PELVIS WITH CONTRAST TECHNIQUE: "Colon: There is apparent diffuse circumferential wall thickening of the transverse colon and portions of the ascending colon. No retroperitoneal lymphadenopathy. --No mesenteric lymphadenopathy. --No pelvic or inguinal lymphadenopathy. No ascites or free air. IMPRESSION: 1. Apparent diffuse circumferential wall thickening of the transverse colon and portions of the ascending colon, which  may be seen in the setting of colitis. 2. Trace right-sided pleural effusion. 3. Hepatic steatosis. Aortic Atherosclerosis (ICD10-I70.0). Electronically Signed   By: Constance Holster M.D.   On: 11/16/2020 19:57     A/P [name] is a 70 y.o. female here with ab pain, diarrhea. Going on for 3 weeks, worse today. Mild LLQ tenderness on exam. CT showed possible colitis. Felt better after IVF and zofran. Will dc home with cipro/flagyl

## 2020-12-03 DIAGNOSIS — K529 Noninfective gastroenteritis and colitis, unspecified: Secondary | ICD-10-CM | POA: Insufficient documentation

## 2020-12-03 MED ORDER — HYDROCODONE-ACETAMINOPHEN 10-325 MG PO TABS: 1.0000 | ORAL_TABLET | Freq: Four times a day (QID) | ORAL | 0 refills | Status: DC | PRN

## 2020-12-03 NOTE — Assessment & Plan Note (Addendum)
Lab Results  Component Value Date   HGBA1C 6.2 (A) 12/02/2020  Established problem Well Controlled. Conintue with diet control

## 2020-12-03 NOTE — Assessment & Plan Note (Signed)
Patient at risk for unintentional opioid overdose bc of her concurrent prescription of lorazepam and opioids. Rs for Naloxone pen sent to pharmacy Discussed use and importance of teaching others around her how to use the pen.

## 2020-12-03 NOTE — Assessment & Plan Note (Signed)
Established problem Well Controlled. No signs of complications, medication side effects, or red flags. Continue current medications and other regiments.  

## 2020-12-03 NOTE — Assessment & Plan Note (Signed)
Acute illness that is slowly improving Persistent small to large volume "[place] 6 stools" 3-5 times a day. ED visit 11/16/20 reviewed.  Patient only took metronidazole for 7 days.  She did not receive Cipro that EDP note had wanted prescribed.   Plan: Cipro 500 mg BID and Flagyl 500 mg BID for 5 days each.  Red flags reviewed.

## 2020-12-03 NOTE — Assessment & Plan Note (Signed)
Established problem Weight improved but much due to colitis illness.

## 2020-12-03 NOTE — Assessment & Plan Note (Signed)
.  Established problem Decreasing level of activity bc of pain with movment/walking Patient is interested in both [place].   Referral placed to Voncille Lo, PT [facility] for evaluation and treatment.

## 2020-12-03 NOTE — Assessment & Plan Note (Signed)
New problem. Noted on 11/16/20 CT AP. No elevation of LFTs Likely due to obesity and DMT2. Patient does not drink alcohol. Discussed primary role of diet, aerobic exercise and weight management in prevenint progression to scarring hepatitis/cirrhosis over time.  Eventually will need elastography

## 2020-12-03 NOTE — Assessment & Plan Note (Signed)
Established problem PHQ9 SCORE ONLY 12/02/2020 09/16/2020 06/10/2020  PHQ-9 Total Score 12 16 12    Relatively stable.  No thoughts of self-harm.  To see Dr Reece Levy next month.  No need for acute intervention

## 2020-12-06 ENCOUNTER — Other Ambulatory Visit: Payer: Self-pay | Admitting: Family Medicine

## 2020-12-06 ENCOUNTER — Other Ambulatory Visit: Payer: PPO

## 2020-12-06 ENCOUNTER — Encounter: Payer: Self-pay | Admitting: Family Medicine

## 2020-12-06 ENCOUNTER — Other Ambulatory Visit: Payer: Self-pay

## 2020-12-06 ENCOUNTER — Telehealth: Payer: Self-pay | Admitting: Gastroenterology

## 2020-12-06 ENCOUNTER — Ambulatory Visit (INDEPENDENT_AMBULATORY_CARE_PROVIDER_SITE_OTHER): Payer: PPO | Admitting: Family Medicine

## 2020-12-06 DIAGNOSIS — K529 Noninfective gastroenteritis and colitis, unspecified: Secondary | ICD-10-CM | POA: Diagnosis not present

## 2020-12-06 DIAGNOSIS — M47896 Other spondylosis, lumbar region: Secondary | ICD-10-CM

## 2020-12-06 DIAGNOSIS — G894 Chronic pain syndrome: Secondary | ICD-10-CM

## 2020-12-06 DIAGNOSIS — M48061 Spinal stenosis, lumbar region without neurogenic claudication: Secondary | ICD-10-CM

## 2020-12-06 DIAGNOSIS — R197 Diarrhea, unspecified: Secondary | ICD-10-CM

## 2020-12-06 MED ORDER — MORPHINE SULFATE ER 15 MG PO TBCR
15.0000 mg | EXTENDED_RELEASE_TABLET | Freq: Two times a day (BID) | ORAL | 0 refills | Status: DC
Start: 2020-12-06 — End: 2021-01-04

## 2020-12-06 NOTE — Telephone Encounter (Signed)
Appears to be a new problem for her.  Seen here for screening colonoscopy August 2020  I reviewed the ED records and PCP note.  She has not yet had any stool testing to look for infection, particularly C diff.  Please arrange stool for:  C difficile toxin A/B and GDH antigen as well as Ova and parasites  Drink plenty of fluids.  Avoid imodium until we know that C diff is negative.

## 2020-12-06 NOTE — Telephone Encounter (Signed)
Spoke with patient in regards to Dr. Loletha Carrow' recommendations. Patient is aware that she can stop by the lab in the basement between 7:30 AM- 5 PM, Monday through Friday to pick up the stool kit and instructions. Patient is aware that we send the stool tests to an outside lab and will take several days for [place] to receive the results so it would be best for her to complete as soon as she can. Advised patient to drink plenty of fluids and to avoid Imodium until we receive the results. Patient verbalized understanding of all information and had no concerns at the end of the call.   Lab orders in epic

## 2020-12-06 NOTE — Assessment & Plan Note (Addendum)
Patient presents today with continued nausea, diarrhea and bloating.  Abdominal exam is reassuring as she only has mild tenderness to palpation periumbilically and in LLQ. Abdomen is otherwise soft and nondistended with normal active bowel sounds. Vitals stable. No reported fevers. No concern for abscess or perforation at this time. Do not feel repeat imaging is warranted at this time given minimal pain on palpation. Patient is up to date on her colonoscopy, states she is due next year. Strict return precautions given to patient.  -Increase Bentyl to QID for symptomatic relief -Finish Abx course -Follow up with GI as scheduled -Stool ova and parasite, C. Diff as ordered by GI  -Recommend bland diet for now, advance as tolerated -Encouraged hydration -Strict return precautions given to patient

## 2020-12-06 NOTE — Patient Instructions (Addendum)
It was wonderful to see you today.  Please bring ALL of your medications with you to every visit.   Today we talked about:  Your colitis. Please continue to finish your antibiotics. You can increase the frequency that you take the Bentyl. You can take this up to 4 times a day. Please keep your schedule appointment with the GI doctor for next week. Eat soft, bland foods as described below.   Go to the ED if you develop fever, worsening abdominal pain and/or are not able to keep any fluids down.   Thank you for choosing [facility].   Please call [phone] with any questions about today's appointment.  Please be sure to schedule follow up at the front  desk before you leave today.   Sharion Settler, DO PGY-1 Family Medicine    Colitis  Colitis is a condition in which the colon is inflamed. It can cause diarrhea, blood in the stool, and abdominal pain. Colitis can last a short time (be acute), or it may last a long time (become chronic). What are the causes? This condition may be caused by:  Infections from viruses or bacteria.  A reaction to medicine.  Certain autoimmune diseases, such as Crohn's disease or ulcerative colitis.  Radiation treatment.  Decreased blood flow to the bowel (ischemia). What are the signs or symptoms? Symptoms of this condition include:  Diarrhea, blood in the stool, or black, tarry stool.  Pain in the joints or abdominal pain.  Fever or fatigue.  Vomiting.  Weight loss.  Bloating.  Having fewer bowel movements than usual.  A strong and sudden urge to have a bowel movement.  Feeling like the bowel is not empty after a bowel movement. How is this diagnosed? This condition may be diagnosed based on a stool test and a blood test. You may also have other tests, such as:  X-rays.  CT scan.  Colonoscopy.  Endoscopy.  Biopsy. How is this treated? Treatment for this condition depends on the cause. This condition  may be treated with:  Steps to rest the bowel, such as not eating or drinking for a period of time.  Fluids that are given through an IV.  Medicine for pain and diarrhea.  Antibiotic medicines.  Cortisone medicines.  Surgery. Follow these instructions at home: Eating and drinking  Follow instructions from your health care provider about eating or drinking restrictions.  Drink enough fluid to keep your urine pale yellow.  Work with a dietitian to determine whether certain foods cause your condition to flare up.  Avoid foods or drinks that cause flare-ups.  Eat a well-balanced diet.   General instructions  If you were prescribed an antibiotic medicine, take it as told by your health care provider. Do not stop taking the antibiotic even if you start to feel better.  Take over-the-counter and prescription medicines only as told by your health care provider.  Keep all follow-up visits. This is important. Contact a health care provider if:  Your symptoms do not go away.  You develop new symptoms. Get help right away if:  You have a fever that does not go away with treatment.  You develop chills.  You have extreme weakness, fainting, or dehydration.  You vomit repeatedly.  You develop severe pain in your abdomen.  You pass bloody or tarry stool. Summary  Colitis is a condition in which the colon is inflamed. Colitis can last a short time (be acute), or it may last a long  time (become chronic).  Treatment for this condition depends on the cause and may include resting the bowel, taking medicines, or having surgery.  If you were prescribed an antibiotic medicine, take it as told by your health care provider. Do not stop taking the antibiotic even if you start to feel better.  Get help right away if you develop severe pain in your abdomen.  Keep all follow-up visits. This is important. This information is not intended to replace advice given to you by your health  care provider. Make sure you discuss any questions you have with your health care provider. Document Revised: 06/29/2020 Document Reviewed: 06/29/2020 Elsevier Patient Education  2021 [place] Diet A bland diet consists of foods that are often soft and do not have a lot of fat, fiber, or extra seasonings. Foods without fat, fiber, or seasoning are easier for the body to digest. They are also less likely to irritate your mouth, throat, stomach, and other parts of your digestive system. A bland diet is sometimes called a BRAT diet. What is my plan? Your health care provider or food and nutrition specialist (dietitian) may recommend specific changes to your diet to prevent symptoms or to treat your symptoms. These changes may include: Eating small meals often. Cooking food until it is soft enough to chew easily. Chewing your food well. Drinking fluids slowly. Not eating foods that are very spicy, sour, or fatty. Not eating citrus fruits, such as oranges and grapefruit. What do I need to know about this diet? Eat a variety of foods from the bland diet food list. Do not follow a bland diet longer than needed. Ask your health care provider whether you should take vitamins or supplements. What foods can I eat? Grains Hot cereals, such as cream of wheat. Rice. Bread, crackers, or tortillas made from refined white flour.   Vegetables Canned or cooked vegetables. Mashed or boiled potatoes. Fruits Bananas. Applesauce. Other types of cooked or canned fruit with the skin and seeds removed, such as canned peaches or pears.   Meats and other proteins Scrambled eggs. Creamy peanut butter or other nut butters. Lean, well-cooked meats, such as chicken or fish. Tofu. Soups or broths.   Dairy Low-fat dairy products, such as milk, cottage cheese, or yogurt. Beverages Water. Herbal tea. Apple juice.   Fats and oils Mild salad dressings. Canola or olive oil. Sweets and desserts Pudding.  Custard. Fruit gelatin. Ice cream. The items listed above may not be a complete list of recommended foods and beverages. Contact a dietitian for more options. What foods are not recommended? Grains Whole grain breads and cereals. Vegetables Raw vegetables. Fruits Raw fruits, especially citrus, berries, or dried fruits. Dairy Whole fat dairy foods. Beverages Caffeinated drinks. Alcohol. Seasonings and condiments Strongly flavored seasonings or condiments. Hot sauce. Salsa. Other foods Spicy foods. Fried foods. Sour foods, such as pickled or fermented foods. Foods with high sugar content. Foods high in fiber. The items listed above may not be a complete list of foods and beverages to avoid. Contact a dietitian for more information. Summary A bland diet consists of foods that are often soft and do not have a lot of fat, fiber, or extra seasonings. Foods without fat, fiber, or seasoning are easier for the body to digest. Check with your health care provider to see how long you should follow this diet plan. It is not meant to be followed for long periods. This information is not intended to replace advice  given to you by your health care provider. Make sure you discuss any questions you have with your health care provider. Document Revised: 11/21/2017 Document Reviewed: 11/21/2017 Elsevier Patient Education  2021 [employer].

## 2020-12-06 NOTE — Progress Notes (Signed)
SUBJECTIVE:   CHIEF COMPLAINT / HPI:   Follow up on colitis Patient presents for persistence of symptoms.  Was previously seen in the emergency department on January 11, diagnosed with colitis and prescribed Cipro and Flagyl. Appears that she did not initially take Cipro due to issues with prescription being filled but did take 7-day course of Flagyl. Returned for follow up on 1/27 with continued abodminal pain and diarrhea. At that time was prescribed additional 5 day course of Cipro/Flagyl.  States she has taken 2-1/2 days of this.  She was able to eat "regular" food on Saturday, 1/29, but subsequently had several episodes of diarrhea and has not been able to tolerate much food since.  She has nausea for which she takes Compazine but has not had any vomiting.  She is able to tolerate liquids and has been drinking about 3 water bottles a day.  She has never formally been diagnosed with colitis in the past but states that she does have IBS with constipation and diarrhea.  She notes some bright red blood with wiping but denies blood mixed in with her stools.  She believes this is from her hemorrhoids.  She denies abdominal pain but feels generally bloated.  She has an appointment next Tuesday with her gastroenterologist.  States she went to the office today to pick up a kit for a stool sample.  PERTINENT  PMH / PSH:  Past Medical History:  Diagnosis Date  . Adrenal nodule ([place]) 04/08/2011   04/08/11 Abdominal CT showed Left adrenal nodule which is tachnically indeterminate but most likely an adenoma.  It is 1.7 cm and 42 HU on portal venous phase image. 31 HU on delayed images.   01/09/2012 Abdominal CT w/ & w/o CM: Stable benign left adrenal adenoma. No further specific follow-up is needed for this finding.    . Allergic rhinitis 01/03/2007   Qualifier: Diagnosis of  By: Drucie Ip    . Allergy   . Anemia    hx of  in 20's  . Anxiety   . Bipolar disorder ([place])   . Carpal tunnel syndrome  02/14/2007   S/P carpel tunnel release bilaterally.    . Cataracts, bilateral    removed both eyes  2017-2018  . Chronic pain syndrome 12/24/2008  . 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 COPD WITHOUT EXACERBATION 01/03/2007   Qualifier: Diagnosis of  By: Drucie Ip  emphysema  . Coronary artery calcification seen on CAT scan 12/03/2017   11/2017 Chest CT: Left anterior descending and right coronary atherosclerosis  . Diabetes mellitus without complication (HCC)    Y5K 6.5 as of 05-2019- diet changes, no meds currently   . DISC WITH RADICULOPATHY 01/03/2007   Qualifier: Diagnosis of  By: Drucie Ip    . Emphysema of lung ([place])   . Esophageal reflux 01/03/2007   Centricity Description: GASTROESOPHAGEAL REFLUX, NO ESOPHAGITIS Qualifier: Diagnosis of  By: Drucie Ip   Centricity Description: GERD Qualifier: Diagnosis of  By: Tye Savoy MD, Tommi Rumps    . External hemorrhoids s/p hemorrhoidectomy 03/28/2018 03/28/2018  . Fibromyalgia   . Fracture of bone spur of inferior portion of calcaneus 10/06/2018  . Functional gastrointestinal disorder 06/18/2008   Functional Gastrointestinal Disorder with frequent eructation and bloating sensation with acute flares.  Responsive to Compazine and Bentyl.  Takes a few days to calm down.    Marland Kitchen GERD without esophagitis 01/03/2007   Centricity Description: GASTROESOPHAGEAL REFLUX, NO ESOPHAGITIS Qualifier: Diagnosis  of  By: Drucie Ip   Centricity Description: GERD Qualifier: Diagnosis of  By: Tye Savoy MD, Tommi Rumps    . Heel spur, fracture, left 04/17/2018  . HEMORRHOIDS, NOS 01/03/2007   Qualifier: Diagnosis of  By: Drucie Ip    . Hepatic steatosis 04/12/2011  . Herpes simplex labialis 05/11/2011  . Hip osteoarthritis 05/23/2007   MRI Pelvis (03/13/07) Bilateral osteoarthritis of the hips, left greater than right.  The  patient has a slightly more prominent hip effusion on the left than  the right.  No other  significant abnormality.    . History of MRSA infection 04/28/2011  . HYPERCHOLESTEROLEMIA 12/24/2008  . Hypertension   . HYPERTENSION, BENIGN SYSTEMIC 01/03/2007   Qualifier: Diagnosis of  By: Drucie Ip    . HYPERTRIGLYCERIDEMIA 11/11/2007   Qualifier: Diagnosis of  By: McDiarmid MD, Sherren Mocha    . Hypokalemia 12/12/2017  . Hypothyroidism 02/15/2007   Qualifier: Diagnosis of  By: McDiarmid MD, Sherren Mocha    . HYPOTHYROIDISM, BORDERLINE 02/15/2007  . Irritable bowel syndrome 01/03/2007   Qualifier: Diagnosis of  By: Drucie Ip    . LACTOSE INTOLERANCE 03/10/2008  . Major depressive disorder, recurrent episode ([place]) 01/03/2007   Qualifier: Diagnosis of  By: Drucie Ip    . Medication management 12/14/2016   Managing topiramate for weight loss starting 12/14/16  . Obesity (BMI 30.0-34.9) 09/30/2007   Has lost 55 lbs since easter. Goal of <190 by 08/05/12.     . Obesity, Class III, BMI 40-49.9 (morbid obesity) ([place]) 09/30/2007       . Osteoarthritis of spine 01/03/2007   MR I Lumbar (08/03): GeneralDDD; L2-3 Large  HNP.  Mod pos  hnp - 12/14/2005, smhnpw/irritL4root - 12/14/2005    . OSTEOARTHRITIS, HANDS, BILATERAL 06/18/2008   Qualifier: Diagnosis of  By: McDiarmid MD, Sherren Mocha    . PATELLO FEMORAL STRESS SYNDROME 01/03/2007   Qualifier: Diagnosis of  By: Drucie Ip    . Periodic limb movements of sleep 12/05/2010   Diagnosed on Polysomnography testing Fall 2011.     Marland Kitchen Pneumonia    walking pneumonia in the 90's  . Pre-diabetes 12/25/2008   Qualifier: Diagnosis of  By: McDiarmid MD, Sherren Mocha    . Prolapsed internal hemorrhoids, grade 3, s/p ligation/pexy/hemorrhoidectomy 03/28/2018 03/28/2018  . RESTLESS LEGS SYNDROME 09/11/2007   Polysomnography (10/2010, Dr Keturah Barre, interpreter. ) showed periodic limb movements that frequently awoke patient from sleep with arousal index of 4 per hour. Dr Danton Sewer (Sleep Specialist) thought her RLS was the major contributor to patient poor sleep condition, more than  any sleep apnea.  He recommended considering opamine agonist (either requip or mirapex) to see if this will help.     . Rosacea, acne 10/24/2011  . SIGMOID POLYP 01/23/2008  . Solitary lung nodule 04/08/2011  . Stage 1 mild COPD by GOLD classification ([place]) 01/03/2007   11/28/2017 Chest CT: Mild centrilobular and paraseptal emphysema with mild diffuse bronchial wall thickening    . Stress incontinence   . Synovial cyst of lumbar facet joint 02/17/2016  . Trigger thumb of left hand 08/16/2018  . Urticaria, chronic 05/22/2011  . VITAMIN D DEFICIENCY 03/03/2010  . Work-related condition 05/17/2018     OBJECTIVE:   BP (!) 141/70   Pulse 71   Ht '5\' 4"'  (1.626 m)   Wt 250 lb 12.8 oz (113.8 kg)   SpO2 94%   BMI 43.05 kg/m    General: NAD, pleasant, able to participate in exam Cardiac: RRR, no murmurs. Respiratory:  CTAB, normal effort, No wheezes, rales or rhonchi Abdomen: Obese, bowel sounds present, soft, mild tenderness to palpation periumbilical and LLQ, nondistended, no rebound/guarding Extremities: Cap refill <2 seconds   ASSESSMENT/PLAN:   Colitis Patient presents today with continued nausea, diarrhea and bloating.  Abdominal exam is reassuring as she only has mild tenderness to palpation periumbilically and in LLQ. Abdomen is otherwise soft and nondistended with normal active bowel sounds. Vitals stable. No reported fevers. No concern for abscess or perforation at this time. Do not feel repeat imaging is warranted at this time given minimal pain on palpation. Patient is up to date on her colonoscopy, states she is due next year. Strict return precautions given to patient.  -Increase Bentyl to QID for symptomatic relief -Finish Abx course -Follow up with GI as scheduled -Stool ova and parasite, C. Diff as ordered by GI  -Recommend bland diet for now, advance as tolerated -Strict return precautions given to patient     Sharion Settler, [place]     This note was prepared using Dragon voice recognition software and may include unintentional dictation errors due to the inherent limitations of voice recognition software.

## 2020-12-06 NOTE — Telephone Encounter (Signed)
Spoke with patient, she reports that she was seen in the ED about 2 weeks ago and was diagnosed with Colitis, at that time patient states that they prescribed Metronidazole for 7 days and Cipro but the pharmacy never filled the Cipro. Patient states that she saw her PCP last Thursday and they prescribed another course of Ciprofloxacin and metronidazole for 5 days. Patient states that she was only able to eat some crackers yesterday, she states that she took 1 gas-x this morning and her morning medications but did not take antibiotic today, she reports 3 episodes of diarrhea so far, describes as "yellowish water and brown mush", she states that she only passes a little bit of diarrhea at times, reports urgency, rectal pressure, and  feeling bloated. She states that she took Compazine around 9:30 AM for nausea. Patient has been scheduled for a follow up on Tuesday, 12/14/20 at 3:40 PM. Patient is scheduled for a follow up with her PCP as well today at 1:45 PM. Please advise on any further recommendations until appointment. Thanks.

## 2020-12-07 ENCOUNTER — Other Ambulatory Visit: Payer: PPO

## 2020-12-07 DIAGNOSIS — R197 Diarrhea, unspecified: Secondary | ICD-10-CM | POA: Diagnosis not present

## 2020-12-08 ENCOUNTER — Other Ambulatory Visit: Payer: Self-pay | Admitting: Family Medicine

## 2020-12-08 DIAGNOSIS — E119 Type 2 diabetes mellitus without complications: Secondary | ICD-10-CM

## 2020-12-09 ENCOUNTER — Other Ambulatory Visit: Payer: Self-pay | Admitting: Family Medicine

## 2020-12-09 ENCOUNTER — Telehealth: Payer: Self-pay | Admitting: Genetic Counselor

## 2020-12-09 ENCOUNTER — Telehealth: Payer: Self-pay | Admitting: *Deleted

## 2020-12-09 DIAGNOSIS — Z8481 Family history of carrier of genetic disease: Secondary | ICD-10-CM

## 2020-12-09 NOTE — Telephone Encounter (Signed)
Received a geneticc counseling referral from Dr. McDiarmid for fhx of brca. Pt has been cld and scheduled to see Raquel Sarna on 2/15 at 2pm. Pt aware to arrive 15 minutes early.

## 2020-12-09 NOTE — Telephone Encounter (Signed)
Received refill request for Naloxone 4mg  nasal spray.  Not on med list.  Will forward to PCP. Christen Bame, CMA

## 2020-12-10 LAB — OVA AND PARASITE EXAMINATION
CONCENTRATE RESULT:: NONE SEEN
MICRO NUMBER:: 11481328
SPECIMEN QUALITY:: ADEQUATE
TRICHROME RESULT:: NONE SEEN

## 2020-12-10 LAB — C. DIFFICILE GDH AND TOXIN A/B
GDH ANTIGEN: NOT DETECTED
MICRO NUMBER:: 11481432
SPECIMEN QUALITY:: ADEQUATE
TOXIN A AND B: NOT DETECTED

## 2020-12-14 ENCOUNTER — Encounter: Payer: Self-pay | Admitting: Gastroenterology

## 2020-12-14 ENCOUNTER — Ambulatory Visit: Payer: PPO | Admitting: Gastroenterology

## 2020-12-14 VITALS — BP 142/72 | HR 75 | Ht 64.0 in | Wt 254.0 lb

## 2020-12-14 DIAGNOSIS — R1030 Lower abdominal pain, unspecified: Secondary | ICD-10-CM | POA: Diagnosis not present

## 2020-12-14 DIAGNOSIS — R933 Abnormal findings on diagnostic imaging of other parts of digestive tract: Secondary | ICD-10-CM | POA: Diagnosis not present

## 2020-12-14 DIAGNOSIS — K529 Noninfective gastroenteritis and colitis, unspecified: Secondary | ICD-10-CM | POA: Diagnosis not present

## 2020-12-14 NOTE — Progress Notes (Signed)
[facility] Progress Note  Chief Complaint: Chronic diarrhea  Subjective  History: I last saw this patient for a screening colonoscopy in August 2020. Skyley contacted our office on 12/06/2020 complaining of ongoing abdominal pain and diarrhea that led to an ED visit on January 11. These records indicate a few weeks of symptoms with mild LLQ tenderness on exam and a CT scan suggesting colitis.  She was prescribed ciprofloxacin and metronidazole, but this did not seem to help her symptoms.  After she contacted our office I recommended stool testing for ova and parasites and C. difficile, all of which were negative.  Neko tells me then in the 2 to 3 weeks leading up to the ED visit, she had escalation of what she previously experiences IBS symptoms. She would feel the urge for BM but then could not go, and then when it would happen she might have loose stool and felt it might be occurring around an impaction. She had increased bloating and crampy lower abdominal pain, and says all this was coinciding with significant increase in stress related to finances and her daughter's recent cancer illnesses. She has continued to take MiraLAX regularly to counteract the constipating effect of her chronic opioid therapy. Right now she is using half a capful in her coffee every morning and still taking dicyclomine that was recently started by primary care after the ED visit. She had taken that medicine years ago during IBS flares and it seemed to work well for her. Took 1 Imodium tablet last week after calling our office for advice. Blood on the paper from her hemorrhoids during this recent illness, no bloody diarrhea. ROS: Cardiovascular:  no chest pain Respiratory: no dyspnea Bipolar disorder Fatigue Chronic pain syndrome Remainder of systems negative such as above The patient's Past Medical, Family and Social History were reviewed and are on file in the EMR. Past Medical History:  Diagnosis Date   . Adrenal nodule ([place]) 04/08/2011   04/08/11 Abdominal CT showed Left adrenal nodule which is tachnically indeterminate but most likely an adenoma.  It is 1.7 cm and 42 HU on portal venous phase image. 31 HU on delayed images.   01/09/2012 Abdominal CT w/ & w/o CM: Stable benign left adrenal adenoma. No further specific follow-up is needed for this finding.    . Allergic rhinitis 01/03/2007   Qualifier: Diagnosis of  By: Drucie Ip    . Allergy   . Anemia    hx of  in 20's  . Anxiety   . Bipolar disorder ([place])   . Carpal tunnel syndrome 02/14/2007   S/P carpel tunnel release bilaterally.    . Cataracts, bilateral    removed both eyes  2017-2018  . Chronic pain syndrome 12/24/2008  . 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 COPD WITHOUT EXACERBATION 01/03/2007   Qualifier: Diagnosis of  By: Drucie Ip  emphysema  . Coronary artery calcification seen on CAT scan 12/03/2017   11/2017 Chest CT: Left anterior descending and right coronary atherosclerosis  . Diabetes mellitus without complication (HCC)    N1Z 6.5 as of 05-2019- diet changes, no meds currently   . DISC WITH RADICULOPATHY 01/03/2007   Qualifier: Diagnosis of  By: Drucie Ip    . Emphysema of lung ([place])   . Esophageal reflux 01/03/2007   Centricity Description: GASTROESOPHAGEAL REFLUX, NO ESOPHAGITIS Qualifier: Diagnosis of  By: Drucie Ip   Centricity Description: GERD Qualifier: Diagnosis of  By: Tye Savoy MD,  [place]    . External hemorrhoids s/p hemorrhoidectomy 03/28/2018 03/28/2018  . Fibromyalgia   . Fracture of bone spur of inferior portion of calcaneus 10/06/2018  . Functional gastrointestinal disorder 06/18/2008   Functional Gastrointestinal Disorder with frequent eructation and bloating sensation with acute flares.  Responsive to Compazine and Bentyl.  Takes a few days to calm down.    Marland Kitchen GERD without esophagitis 01/03/2007   Centricity Description: GASTROESOPHAGEAL  REFLUX, NO ESOPHAGITIS Qualifier: Diagnosis of  By: Drucie Ip   Centricity Description: GERD Qualifier: Diagnosis of  By: Tye Savoy MD, Tommi Rumps    . Heel spur, fracture, left 04/17/2018  . HEMORRHOIDS, NOS 01/03/2007   Qualifier: Diagnosis of  By: Drucie Ip    . Hepatic steatosis 04/12/2011  . Herpes simplex labialis 05/11/2011  . Hip osteoarthritis 05/23/2007   MRI Pelvis (03/13/07) Bilateral osteoarthritis of the hips, left greater than right.  The  patient has a slightly more prominent hip effusion on the left than  the right.  No other significant abnormality.    . History of MRSA infection 04/28/2011  . HYPERCHOLESTEROLEMIA 12/24/2008  . Hypertension   . HYPERTENSION, BENIGN SYSTEMIC 01/03/2007   Qualifier: Diagnosis of  By: Drucie Ip    . HYPERTRIGLYCERIDEMIA 11/11/2007   Qualifier: Diagnosis of  By: McDiarmid MD, Sherren Mocha    . Hypokalemia 12/12/2017  . Hypothyroidism 02/15/2007   Qualifier: Diagnosis of  By: McDiarmid MD, Sherren Mocha    . HYPOTHYROIDISM, BORDERLINE 02/15/2007  . Irritable bowel syndrome 01/03/2007   Qualifier: Diagnosis of  By: Drucie Ip    . LACTOSE INTOLERANCE 03/10/2008  . Major depressive disorder, recurrent episode ([place]) 01/03/2007   Qualifier: Diagnosis of  By: Drucie Ip    . Medication management 12/14/2016   Managing topiramate for weight loss starting 12/14/16  . Obesity (BMI 30.0-34.9) 09/30/2007   Has lost 55 lbs since easter. Goal of <190 by 08/05/12.     . Obesity, Class III, BMI 40-49.9 (morbid obesity) ([place]) 09/30/2007       . Osteoarthritis of spine 01/03/2007   MR I Lumbar (08/03): GeneralDDD; L2-3 Large  HNP.  Mod pos  hnp - 12/14/2005, smhnpw/irritL4root - 12/14/2005    . OSTEOARTHRITIS, HANDS, BILATERAL 06/18/2008   Qualifier: Diagnosis of  By: McDiarmid MD, Sherren Mocha    . PATELLO FEMORAL STRESS SYNDROME 01/03/2007   Qualifier: Diagnosis of  By: Drucie Ip    . Periodic limb movements of sleep 12/05/2010   Diagnosed on Polysomnography testing Fall 2011.      Marland Kitchen Pneumonia    walking pneumonia in the 90's  . Pre-diabetes 12/25/2008   Qualifier: Diagnosis of  By: McDiarmid MD, Sherren Mocha    . Prolapsed internal hemorrhoids, grade 3, s/p ligation/pexy/hemorrhoidectomy 03/28/2018 03/28/2018  . RESTLESS LEGS SYNDROME 09/11/2007   Polysomnography (10/2010, Dr Keturah Barre, interpreter. ) showed periodic limb movements that frequently awoke patient from sleep with arousal index of 4 per hour. Dr Danton Sewer (Sleep Specialist) thought her RLS was the major contributor to patient poor sleep condition, more than any sleep apnea.  He recommended considering opamine agonist (either requip or mirapex) to see if this will help.     . Rosacea, acne 10/24/2011  . SIGMOID POLYP 01/23/2008  . Solitary lung nodule 04/08/2011  . Stage 1 mild COPD by GOLD classification ([place]) 01/03/2007   11/28/2017 Chest CT: Mild centrilobular and paraseptal emphysema with mild diffuse bronchial wall thickening    . Stress incontinence   . Synovial cyst of lumbar facet joint 02/17/2016  .  Trigger thumb of left hand 08/16/2018  . Urticaria, chronic 05/22/2011  . VITAMIN D DEFICIENCY 03/03/2010  . Work-related condition 05/17/2018   Past Surgical History:  Procedure Laterality Date  . APPENDECTOMY    . bladder tack  1998   Dr Jeffie Pollock (urology)  . BUNIONECTOMY  1992   bilateral feet with pins in great toes  . CARDIAC CATHETERIZATION    . CARPAL TUNNEL RELEASE  2010   Bilateral, Dr Theodis Sato  . COLONOSCOPY  2004   Dr Verdia Kuba (GI)  . CYSTOCELE REPAIR  1998    Dr Gertie Fey (GYN)-  . ESOPHAGOGASTRODUODENOSCOPY    . EVALUATION UNDER ANESTHESIA WITH HEMORRHOIDECTOMY N/A 03/28/2018   Procedure: ANORECTAL EXAM UNDER ANESTHESIA lateral internal partial sphincterotomy, hemorrhoidal ligation pixie, HEMORRHOIDECTOMY;  Surgeon: Michael Boston, MD;  Location: [facility];  Service: General;  Laterality: N/A;  . EXAM ANORECTAL W/ ULTRASOUND     Dr. Johney Maine 03-28-18   . EXPLORATORY LAPAROTOMY    . FACIAL  LACERATIONS REPAIR     Facial Laceration repair as adolescent   . INJECTION HIP INTRA ARTICULAR  2008   Left Hip fluoroscopically-guided corticosteorid injection for painful osteoarthritis with good effect (06/2007)  . LAPAROSCOPY FOR ECTOPIC PREGNANCY    . LUMBAR LAMINECTOMY/DECOMPRESSION MICRODISCECTOMY N/A 06/21/2016   Procedure: MICRO LUMBAR DECOMPRESSION L4-L5 AND REMOVAL OF SYNOVIAL CYST    1 LEVEL;  Surgeon: Susa Day, MD;  Location: [facility];  Service: Orthopedics;  Laterality: N/A;  . NM MYOVIEW LTD  2011   Dr Daneen Schick, III (Card)  . RECTOCELE REPAIR  1998   Dr Gertie Fey (GYN)  . SPHINCTEROTOMY N/A 03/28/2018   Procedure: ANAL SPHINCTEROTOMY;  Surgeon: Michael Boston, MD;  Location: [facility];  Service: General;  Laterality: N/A;  . UPPER GASTROINTESTINAL ENDOSCOPY      Objective:  Med list reviewed  Current Outpatient Medications:  .  atorvastatin (LIPITOR) 20 MG tablet, TAKE 1 TABLET BY MOUTH EVERY DAY, Disp: 34 tablet, Rfl: 11 .  Calcium Carb-Cholecalciferol 1000-800 MG-UNIT TABS, Take by mouth., Disp: , Rfl:  .  citalopram (CELEXA) 40 MG tablet, Take 0.5 tablets (20 mg total) by mouth at bedtime. (Patient taking differently: Take 40 mg by mouth daily.), Disp: , Rfl: 0 .  Continuous Blood Gluc Sensor (FREESTYLE LIBRE 14 DAY SENSOR) MISC, APPLY EVERY 14 DAYS., Disp: 2 each, Rfl: 99 .  darifenacin (ENABLEX) 7.5 MG 24 hr tablet, Take 1 tablet (7.5 mg total) by mouth daily., Disp: 30 tablet, Rfl: 2 .  dicyclomine (BENTYL) 20 MG tablet, Take 1 tablet (20 mg total) by mouth every 8 (eight) hours as needed for spasms. (Patient taking differently: Take 30 mg by mouth 2 (two) times daily.), Disp: 90 tablet, Rfl: 0 .  fexofenadine (ALLEGRA) 180 MG tablet, Take 180 mg by mouth daily., Disp: , Rfl:  .  fluticasone (FLONASE) 50 MCG/ACT nasal spray, Place 2 sprays into both nostrils daily., Disp: , Rfl:  .  FLUZONE HIGH-DOSE QUADRIVALENT 0.7 ML SUSY, , Disp: , Rfl:  .  hydrochlorothiazide  (HYDRODIURIL) 25 MG tablet, Take 1 tablet (25 mg total) by mouth daily., Disp: 90 tablet, Rfl: 3 .  HYDROcodone-acetaminophen (NORCO) 10-325 MG tablet, Take 1 tablet by mouth every 6 (six) hours as needed., Disp: 35 tablet, Rfl: 0 .  hydrOXYzine (VISTARIL) 50 MG capsule, Take 150 mg by mouth at bedtime. , Disp: , Rfl:  .  ketoconazole (NIZORAL) 2 % cream, APPLY TOPICALLY TO AFFECTED AREA EVERY DAY, Disp: 15 g, Rfl:  0 .  Lactobacillus-Inulin (CULTURELLE DIGESTIVE DAILY PO), Take by mouth., Disp: , Rfl:  .  levothyroxine (SYNTHROID) 125 MCG tablet, Take 1 tablet (125 mcg total) by mouth every morning. 30 minutes before food, Disp: 90 tablet, Rfl: 3 .  LORazepam (ATIVAN) 1 MG tablet, Take 1 mg by mouth 3 (three) times daily., Disp: , Rfl:  .  metoprolol tartrate (LOPRESSOR) 50 MG tablet, TAKE 1 TABLET BY MOUTH TWICE A DAY, Disp: 180 tablet, Rfl: 3 .  morphine (MS CONTIN) 15 MG 12 hr tablet, Take 1 tablet (15 mg total) by mouth every 12 (twelve) hours., Disp: 60 tablet, Rfl: 0 .  Multiple Minerals-Vitamins (CALCIUM-MAGNESIUM-ZINC-D3) TABS, Take 3 tablets by mouth daily., Disp: , Rfl:  .  nitroGLYCERIN (NITROSTAT) 0.4 MG SL tablet, Place 1 tablet (0.4 mg total) under the tongue every 5 (five) minutes as needed for chest pain., Disp: 25 tablet, Rfl: prn .  omeprazole (PRILOSEC) 40 MG capsule, Take 1 capsule (40 mg total) by mouth daily., Disp: 90 capsule, Rfl: 3 .  polyethylene glycol powder (GLYCOLAX/MIRALAX) 17 GM/SCOOP powder, Take 17 g by mouth as needed for mild constipation., Disp: , Rfl:  .  potassium chloride (KLOR-CON M10) 10 MEQ tablet, Take 4 tablets (40 mEq total) by mouth at bedtime., Disp: 360 tablet, Rfl: 3 .  prazosin (MINIPRESS) 1 MG capsule, Take 2 mg by mouth at bedtime., Disp: , Rfl:  .  prochlorperazine (COMPAZINE) 10 MG tablet, TAKE ONE TABLET BY MOUTH TWICE A DAY AS NEEDED FOR NAUSEA OR VOMITING, Disp: 30 tablet, Rfl: 1 .  ramipril (ALTACE) 10 MG capsule, TAKE 1 CAPSULE BY MOUTH  EVERY DAY, Disp: 90 capsule, Rfl: 3 .  Simethicone (GAS-X PO), Take 2 tablets by mouth daily as needed (gas)., Disp: , Rfl:  .  tiZANidine (ZANAFLEX) 2 MG tablet, Take 1 tablet (2 mg total) by mouth at bedtime., Disp: 90 tablet, Rfl: 0 .  traZODone (DESYREL) 100 MG tablet, , Disp: , Rfl:  .  vitamin B-12 (CYANOCOBALAMIN) 1000 MCG tablet, Take 1,000 mcg by mouth daily., Disp: , Rfl:    Vital signs in last 24 hrs: Vitals:   12/14/20 1518  BP: (!) 142/72  Pulse: 75  SpO2: 95%   Wt Readings from Last 3 Encounters:  12/14/20 254 lb (115.2 kg)  12/06/20 250 lb 12.8 oz (113.8 kg)  12/02/20 250 lb (113.4 kg)    Physical Exam  Not acutely ill-appearing. Antalgic gait, gets on exam table slowly and with little assistance  HEENT: sclera anicteric, oral mucosa moist without lesions  Neck: supple, no thyromegaly, JVD or lymphadenopathy  Cardiac: RRR without murmurs, S1S2 heard, no peripheral edema  Pulm: clear to auscultation bilaterally, normal RR and effort noted  Abdomen: soft, morbidly obese, no tenderness, with active bowel sounds. No guarding or palpable hepatosplenomegaly.  Skin; warm and dry, no jaundice or rash  Labs:  CBC Latest Ref Rng & Units 11/16/2020 05/17/2019 03/21/2018  WBC 4.0 - 10.5 K/uL 10.9(H) 9.2 7.9  Hemoglobin 12.0 - 15.0 g/dL 15.2(H) 14.7 12.9  Hematocrit 36.0 - 46.0 % 46.0 43.8 39.5  Platelets 150 - 400 K/uL 247 296 264   CMP Latest Ref Rng & Units 11/16/2020 04/01/2020 05/17/2019  Glucose 70 - 99 mg/dL 187(H) 106(H) 141(H)  BUN 8 - 23 mg/dL 12 17 12   Creatinine 0.44 - 1.00 mg/dL 0.90 1.01(H) 0.88  Sodium 135 - 145 mmol/L 136 138 136  Potassium 3.5 - 5.1 mmol/L 3.7 4.5 4.6  Chloride 98 -  111 mmol/L 100 96 99  CO2 22 - 32 mmol/L 23 27 25   Calcium 8.9 - 10.3 mg/dL 10.2 10.3 10.3  Total Protein 6.5 - 8.1 g/dL 7.3 - -  Total Bilirubin 0.3 - 1.2 mg/dL 0.7 - -  Alkaline Phos 38 - 126 U/L 62 - -  AST 15 - 41 U/L 27 - -  ALT 0 - 44 U/L 35 - -     ___________________________________________ Radiologic studies:  CLINICAL DATA:  Abdominal pain.   EXAM: CT ABDOMEN AND PELVIS WITH CONTRAST   TECHNIQUE: Multidetector CT imaging of the abdomen and pelvis was performed using the standard protocol following bolus administration of intravenous contrast.   CONTRAST:  121mL OMNIPAQUE IOHEXOL 300 MG/ML  SOLN   COMPARISON:  January 25, 2012   FINDINGS: Lower chest: There is a trace right-sided pleural effusion.There is mild cardiomegaly.   Hepatobiliary: There is decreased hepatic attenuation suggestive of hepatic steatosis. Normal gallbladder.There is no biliary ductal dilation.   Pancreas: Normal contours without ductal dilatation. No peripancreatic fluid collection.   Spleen: Unremarkable.   Adrenals/Urinary Tract:   --Adrenal glands: There is a left adrenal mass measuring approximately 2.7 cm. This is minimally increased in size from prior study and is consistent with a benign adrenal adenoma.   --Right kidney/ureter: No hydronephrosis or radiopaque kidney stones.   --Left kidney/ureter: No hydronephrosis or radiopaque kidney stones.   --Urinary bladder: Unremarkable.   Stomach/Bowel:   --Stomach/Duodenum: No hiatal hernia or other gastric abnormality. Normal duodenal course and caliber.   --Small bowel: Unremarkable.   --Colon: There is apparent diffuse circumferential wall thickening of the transverse colon and portions of the ascending colon.   --Appendix: Not visualized. No right lower quadrant inflammation or free fluid.   Vascular/Lymphatic: Atherosclerotic calcification is present within the non-aneurysmal abdominal aorta, without hemodynamically significant stenosis.   --No retroperitoneal lymphadenopathy.   --No mesenteric lymphadenopathy.   --No pelvic or inguinal lymphadenopathy.   Reproductive: Unremarkable   Other: No ascites or free air. The abdominal wall is normal.    Musculoskeletal. Multilevel degenerative changes are noted throughout the lumbar spine without evidence for an acute compression fracture. There are degenerative changes of both hips.   IMPRESSION: 1. Apparent diffuse circumferential wall thickening of the transverse colon and portions of the ascending colon, which may be seen in the setting of colitis. 2. Trace right-sided pleural effusion. 3. Hepatic steatosis.   Aortic Atherosclerosis (ICD10-I70.0).     Electronically Signed   By: Constance Holster M.D.   On: 11/16/2020 19:57   (I personally reviewed the images, and if there is true colonic wall thickening, it appears mild.  There is no oral contrast on the exam.  No pericolonic stranding- HD) ____________________________________________ Other: Negative C. difficile toxin AB and GDH antigen 12/07/20 Negative ova and parasite on 12/07/2020  _____________________________________________ Assessment & Plan  Assessment: Encounter Diagnoses  Name Primary?  . Lower abdominal pain Yes  . Chronic diarrhea   . Abnormal finding on GI tract imaging    It is not clear to me if that he truly had a colitis recently. If so, it may have been infectious cause that then triggered her underlying IBS leading to protracted symptoms. It seems much less likely that she has new onset inflammatory bowel disease. Fortunately, no C. difficile on testing last week  Plan: I reassured her, and I do not think she needs a colonoscopy at this point I expect things will slowly subside with some time and symptomatic  treatment. I recommended she continue the dicyclomine twice a day until the stools become formed, and that she also decrease the MiraLAX to every other day at this point. When things seem back to her usual baseline, she will cut the dicyclomine down to once daily for about a week and then stop. She was encouraged to call us in about 2 weeks with an update on symptoms or sooner if needed, and  further advice regarding medications can be given.  If she has significant escalation of diarrhea, colonoscopy may then be warranted  30 minutes were spent on this encounter (including chart review, history/exam, counseling/coordination of care, and documentation) > 50% of that time was spent on counseling and coordination of care.  Topics discussed included: Review of ED records, CT scan findings, medication recommendation.  Nelida Meuse III

## 2020-12-14 NOTE — Patient Instructions (Signed)
If you are age 71 or older, your body mass index should be between 23-30. Your Body mass index is 43.6 kg/m. If this is out of the aforementioned range listed, please consider follow up with your Primary Care Provider.  If you are age 27 or younger, your body mass index should be between 19-25. Your Body mass index is 43.6 kg/m. If this is out of the aformentioned range listed, please consider follow up with your Primary Care Provider.   Please follow up in a few weeks and give [place] an update on how you are doing.  It was a pleasure to see you today!  Dr. Loletha Carrow

## 2020-12-18 ENCOUNTER — Other Ambulatory Visit: Payer: Self-pay | Admitting: Family Medicine

## 2020-12-18 DIAGNOSIS — M797 Fibromyalgia: Secondary | ICD-10-CM

## 2020-12-18 DIAGNOSIS — N3946 Mixed incontinence: Secondary | ICD-10-CM

## 2020-12-21 ENCOUNTER — Inpatient Hospital Stay: Payer: PPO | Attending: Genetic Counselor | Admitting: Genetic Counselor

## 2020-12-21 ENCOUNTER — Inpatient Hospital Stay: Payer: PPO

## 2020-12-21 ENCOUNTER — Encounter: Payer: Self-pay | Admitting: Genetic Counselor

## 2020-12-21 ENCOUNTER — Other Ambulatory Visit: Payer: Self-pay

## 2020-12-21 DIAGNOSIS — Z8481 Family history of carrier of genetic disease: Secondary | ICD-10-CM | POA: Diagnosis not present

## 2020-12-21 DIAGNOSIS — Z803 Family history of malignant neoplasm of breast: Secondary | ICD-10-CM

## 2020-12-21 DIAGNOSIS — Z8049 Family history of malignant neoplasm of other genital organs: Secondary | ICD-10-CM | POA: Insufficient documentation

## 2020-12-21 DIAGNOSIS — Z800 Family history of malignant neoplasm of digestive organs: Secondary | ICD-10-CM | POA: Diagnosis not present

## 2020-12-21 DIAGNOSIS — Z801 Family history of malignant neoplasm of trachea, bronchus and lung: Secondary | ICD-10-CM

## 2020-12-21 NOTE — Progress Notes (Signed)
REFERRING PROVIDER: McDiarmid, Blane Ohara, MD [place],  [name]  PRIMARY PROVIDER:  McDiarmid, Blane Ohara, MD  PRIMARY REASON FOR VISIT:  1. Family history of gene mutation   2. Family history of breast cancer   3. Family history of cancer of female genital organ   4. Family history of lung cancer   5. Family history of colon cancer       HISTORY OF PRESENT ILLNESS:   [name], a 70 y.o. female, was seen for a [facility] cancer genetics consultation at the request of Dr. McDiarmid due to a family history of a known mutation in the BRIP1 gene and cancer.  [name] presents to clinic today to discuss the possibility of a hereditary predisposition to cancer, genetic testing, and to further clarify her future cancer risks, as well as potential cancer risks for family members.   [name] does not have a personal history of cancer.    RISK FACTORS:  Menarche was at age 89.  First live birth at age 46.  OCP use for approximately less than 5 years years.  Ovaries intact: yes - one ovary.  Hysterectomy: yes.  Menopausal status: postmenopausal.  HRT use: 0 years (tried patch once). Colonoscopy: yes; 2020 - 6 tubular adenomas. Mammogram within the last year: yes. Number of breast biopsies: 0. Any excessive radiation exposure in the past: no.   Past Medical History:  Diagnosis Date  . Adrenal nodule ([place]) 04/08/2011   04/08/11 Abdominal CT showed Left adrenal nodule which is tachnically indeterminate but most likely an adenoma.  It is 1.7 cm and 42 HU on portal venous phase image. 31 HU on delayed images.   01/09/2012 Abdominal CT w/ & w/o CM: Stable benign left adrenal adenoma. No further specific follow-up is needed for this finding.    . Allergic rhinitis 01/03/2007   Qualifier: Diagnosis of  By: Drucie Ip    . Allergy   . Anemia    hx of  in 20's  . Anxiety   . Bipolar disorder ([place])   . Carpal tunnel syndrome 02/14/2007   S/P carpel tunnel release  bilaterally.    . Cataracts, bilateral    removed both eyes  2017-2018  . Chronic pain syndrome 12/24/2008  . 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 COPD WITHOUT EXACERBATION 01/03/2007   Qualifier: Diagnosis of  By: Drucie Ip  emphysema  . Coronary artery calcification seen on CAT scan 12/03/2017   11/2017 Chest CT: Left anterior descending and right coronary atherosclerosis  . Diabetes mellitus without complication (HCC)    K8M 6.5 as of 05-2019- diet changes, no meds currently   . DISC WITH RADICULOPATHY 01/03/2007   Qualifier: Diagnosis of  By: Drucie Ip    . Emphysema of lung ([place])   . Esophageal reflux 01/03/2007   Centricity Description: GASTROESOPHAGEAL REFLUX, NO ESOPHAGITIS Qualifier: Diagnosis of  By: Drucie Ip   Centricity Description: GERD Qualifier: Diagnosis of  By: Tye Savoy MD, Tommi Rumps    . External hemorrhoids s/p hemorrhoidectomy 03/28/2018 03/28/2018  . Family history of breast cancer   . Family history of cancer of female genital organ   . Family history of colon cancer   . Family history of gene mutation    BRIP1 gene mutation (c.2010dup) in daughter  . Family history of lung cancer   . Fibromyalgia   . Fracture of bone spur of inferior portion of calcaneus 10/06/2018  . Functional  gastrointestinal disorder 06/18/2008   Functional Gastrointestinal Disorder with frequent eructation and bloating sensation with acute flares.  Responsive to Compazine and Bentyl.  Takes a few days to calm down.    Marland Kitchen GERD without esophagitis 01/03/2007   Centricity Description: GASTROESOPHAGEAL REFLUX, NO ESOPHAGITIS Qualifier: Diagnosis of  By: Drucie Ip   Centricity Description: GERD Qualifier: Diagnosis of  By: Tye Savoy MD, Tommi Rumps    . Heel spur, fracture, left 04/17/2018  . HEMORRHOIDS, NOS 01/03/2007   Qualifier: Diagnosis of  By: Drucie Ip    . Hepatic steatosis 04/12/2011  . Herpes simplex labialis 05/11/2011  . Hip  osteoarthritis 05/23/2007   MRI Pelvis (03/13/07) Bilateral osteoarthritis of the hips, left greater than right.  The  patient has a slightly more prominent hip effusion on the left than  the right.  No other significant abnormality.    . History of MRSA infection 04/28/2011  . HYPERCHOLESTEROLEMIA 12/24/2008  . Hypertension   . HYPERTENSION, BENIGN SYSTEMIC 01/03/2007   Qualifier: Diagnosis of  By: Drucie Ip    . HYPERTRIGLYCERIDEMIA 11/11/2007   Qualifier: Diagnosis of  By: McDiarmid MD, Sherren Mocha    . Hypokalemia 12/12/2017  . Hypothyroidism 02/15/2007   Qualifier: Diagnosis of  By: McDiarmid MD, Sherren Mocha    . HYPOTHYROIDISM, BORDERLINE 02/15/2007  . Irritable bowel syndrome 01/03/2007   Qualifier: Diagnosis of  By: Drucie Ip    . LACTOSE INTOLERANCE 03/10/2008  . Major depressive disorder, recurrent episode ([place]) 01/03/2007   Qualifier: Diagnosis of  By: Drucie Ip    . Medication management 12/14/2016   Managing topiramate for weight loss starting 12/14/16  . Obesity (BMI 30.0-34.9) 09/30/2007   Has lost 55 lbs since easter. Goal of <190 by 08/05/12.     . Obesity, Class III, BMI 40-49.9 (morbid obesity) ([place]) 09/30/2007       . Osteoarthritis of spine 01/03/2007   MR I Lumbar (08/03): GeneralDDD; L2-3 Large  HNP.  Mod pos  hnp - 12/14/2005, smhnpw/irritL4root - 12/14/2005    . OSTEOARTHRITIS, HANDS, BILATERAL 06/18/2008   Qualifier: Diagnosis of  By: McDiarmid MD, Sherren Mocha    . PATELLO FEMORAL STRESS SYNDROME 01/03/2007   Qualifier: Diagnosis of  By: Drucie Ip    . Periodic limb movements of sleep 12/05/2010   Diagnosed on Polysomnography testing Fall 2011.     Marland Kitchen Pneumonia    walking pneumonia in the 90's  . Pre-diabetes 12/25/2008   Qualifier: Diagnosis of  By: McDiarmid MD, Sherren Mocha    . Prolapsed internal hemorrhoids, grade 3, s/p ligation/pexy/hemorrhoidectomy 03/28/2018 03/28/2018  . RESTLESS LEGS SYNDROME 09/11/2007   Polysomnography (10/2010, Dr Keturah Barre, interpreter. ) showed periodic  limb movements that frequently awoke patient from sleep with arousal index of 4 per hour. Dr Danton Sewer (Sleep Specialist) thought her RLS was the major contributor to patient poor sleep condition, more than any sleep apnea.  He recommended considering opamine agonist (either requip or mirapex) to see if this will help.     . Rosacea, acne 10/24/2011  . SIGMOID POLYP 01/23/2008  . Solitary lung nodule 04/08/2011  . Stage 1 mild COPD by GOLD classification ([place]) 01/03/2007   11/28/2017 Chest CT: Mild centrilobular and paraseptal emphysema with mild diffuse bronchial wall thickening    . Stress incontinence   . Synovial cyst of lumbar facet joint 02/17/2016  . Trigger thumb of left hand 08/16/2018  . Urticaria, chronic 05/22/2011  . VITAMIN D DEFICIENCY 03/03/2010  . Work-related condition 05/17/2018    Past Surgical History:  Procedure Laterality Date  . APPENDECTOMY    . bladder tack  1998   Dr Jeffie Pollock (urology)  . BUNIONECTOMY  1992   bilateral feet with pins in great toes  . CARDIAC CATHETERIZATION    . CARPAL TUNNEL RELEASE  2010   Bilateral, Dr Theodis Sato  . COLONOSCOPY  2004   Dr Verdia Kuba (GI)  . CYSTOCELE REPAIR  1998    Dr Gertie Fey (GYN)-  . ESOPHAGOGASTRODUODENOSCOPY    . EVALUATION UNDER ANESTHESIA WITH HEMORRHOIDECTOMY N/A 03/28/2018   Procedure: ANORECTAL EXAM UNDER ANESTHESIA lateral internal partial sphincterotomy, hemorrhoidal ligation pixie, HEMORRHOIDECTOMY;  Surgeon: Michael Boston, MD;  Location: [facility];  Service: General;  Laterality: N/A;  . EXAM ANORECTAL W/ ULTRASOUND     Dr. Johney Maine 03-28-18   . EXPLORATORY LAPAROTOMY    . FACIAL LACERATIONS REPAIR     Facial Laceration repair as adolescent   . INJECTION HIP INTRA ARTICULAR  2008   Left Hip fluoroscopically-guided corticosteorid injection for painful osteoarthritis with good effect (06/2007)  . LAPAROSCOPY FOR ECTOPIC PREGNANCY    . LUMBAR LAMINECTOMY/DECOMPRESSION MICRODISCECTOMY N/A 06/21/2016   Procedure: MICRO  LUMBAR DECOMPRESSION L4-L5 AND REMOVAL OF SYNOVIAL CYST    1 LEVEL;  Surgeon: Susa Day, MD;  Location: [facility];  Service: Orthopedics;  Laterality: N/A;  . NM MYOVIEW LTD  2011   Dr Daneen Schick, III (Card)  . RECTOCELE REPAIR  1998   Dr Gertie Fey (GYN)  . SPHINCTEROTOMY N/A 03/28/2018   Procedure: ANAL SPHINCTEROTOMY;  Surgeon: Michael Boston, MD;  Location: [facility];  Service: General;  Laterality: N/A;  . UPPER GASTROINTESTINAL ENDOSCOPY      Social History   Socioeconomic History  . Marital status: Divorced    Spouse name: Not on file  . Number of children: 4  . Years of education: 86  . Highest education level: 12th grade  Occupational History  . Occupation: Surveyor, quantity: [employer]  Tobacco Use  . Smoking status: Former Smoker    Packs/day: 1.50    Years: 44.00    Pack years: 66.00    Types: Cigarettes    Quit date: 04/06/2008    Years since quitting: 12.7  . Smokeless tobacco: Never Used  Vaping Use  . Vaping Use: Never used  Substance and Sexual Activity  . Alcohol use: No  . Drug use: No    Comment: hx of marijuana use - last used 2 months ago   . Sexual activity: Not Currently  Other Topics Concern  . Not on file  Social History Narrative   Retired [place] [employer] worker - early retirement for mental health reasons.    Has Worked part-time  at [employer].Marland KitchenMarland KitchenWorking as Scientist, water quality at [employer] on [address]   pt is separated.    Living with her dgt and son-in-law in two story home.   1 flight of stairs     Quit smoking tobacco June 09 started at age 16. 1 1/2 to 2 ppd .   No etoh    Hx of smokes marijuana - periodically attempts quitting.   Religious - Pentecostal   4 children    Hobbies- word searches and games on her tablet.   No IVDA   Social Determinants of Radio broadcast assistant Strain: Not on file  Food Insecurity: Not on file  Transportation Needs: Not on file  Physical Activity: Not on file  Stress: Not on file  Social Connections: Not on file  FAMILY HISTORY:  We obtained a detailed, 4-generation family history.  Significant diagnoses are listed below: Family History  Problem Relation Age of Onset  . Diabetes Father   . Heart disease Father   . Diabetes type II Other   . Hypertension Other   . Heart attack Other   . Alcohol abuse Other   . Depression Other   . Asthma Other   . Migraines Other   . Hypertension Mother   . Dementia Mother   . Hypertension Sister   . Obesity Sister   . Kidney disease Brother   . Hypertension Daughter   . Hypertension Son   . Heart disease Brother   . Hypertension Brother   . Bipolar disorder Daughter   . Hypertension Daughter   . Breast cancer Daughter 54  . Cancer Daughter 40       vulvar cancer  . Other Daughter        BRIP1 gene mutation c.2010dup  . Hypertension Son   . Colon polyps Son 6  . Diabetes Maternal Aunt   . Lung cancer Paternal Aunt        d. >50  . Heart Problems Paternal Grandmother   . Diabetes Paternal Grandfather   . Cancer Paternal Aunt        unknown type, dx >50  . Cancer Paternal Great-grandmother        abdominal cancer NOS  . Breast cancer Other        paternal great-great aunt (PGF's aunt)  . Cancer Cousin 17       gynecologic cancer NOS (paternal first cousin)  . Cancer Niece 48       gynecologic cancer NOS   . Esophageal cancer Neg Hx   . Stomach cancer Neg Hx   . Rectal cancer Neg Hx   . Colon cancer Neg Hx    [name] has two daughters (ages 84 and 27) and two sons (ages 53 and 67). Her younger daughter was diagnosed with breast cancer and vulvar cancer in the past year, and had genetic testing that showed a pathogenic variant in the BRIP1 gene called c.2010dup. Her other daughter tested negative for the BRIP1 mutation. [name] youngest son has a history of a colon polyp that was cancerous at age 63. She had three brothers and one sister. Her sister's daughter died at age 78 from a gynecologic cancer.  [name] mother is alive at  age 68 without cancer. There was one maternal aunt and two maternal uncles. There is no known cancer among maternal aunts/uncles or maternal cousins. [name] maternal grandmother died at age 68 without cancer. Her maternal grandfather died in his late 62s without cancer.  [name]'s father died at age 82 without cancer. There were five paternal aunts and one paternal uncle. One aunt had lung cancer and another aunt had an unknown type of cancer. One cousin died at age 4 from a gynecologic cancer. Ms. Vachon paternal grandmother died at age 2 without cancer. Her paternal grandfather died at age 98 without cancer. Her grandfather's mother died from an unknown type of cancer in her abdomen, and this great-grandmother had a sister who had breast cancer.  Ms. Bacorn is aware of previous family history of genetic testing for hereditary cancer risks. Patient's ancestors are of [place] and [place] descent. There is no reported Ashkenazi Jewish ancestry. There is no known consanguinity.  GENETIC COUNSELING ASSESSMENT: [name] is a 70 y.o. female with a family  history of a known BRIP1 gene mutation, breast cancer, gynecologic cancer, and young-onset colon cancer, which is somewhat suggestive of a hereditary cancer syndrome and predisposition to cancer. We, therefore, discussed and recommended the following at today's visit.   DISCUSSION:  We discussed that [name] has a 50% (1 in 2) chance to also have the BRIP1 variant that was discovered in her daughter. We reviewed the cancer risks that are associated with BRIP1 mutations, including an increased risk of ovarian cancer and possibly breast cancer. We discussed that testing is beneficial for multiple reasons, including knowing about potential cancer risks and identifying screening and risk-reduction options that may be appropriate.   We reviewed the characteristics, features and inheritance patterns of hereditary cancer syndromes. We  also discussed genetic testing, including the appropriate family members to test, the process of testing, insurance coverage, genetic discrimination, and turn-around-time for results. We discussed the implications of a negative, positive, and variant of uncertain significance result.  We recommended [name] pursue genetic testing for the [facility] Common Hereditary Cancers + RNA panel, which would include analysis of the known BRIP1 gene mutation (c.2010dup). The Common Hereditary Cancers Panel offered by [facility] includes sequencing and/or deletion duplication testing of the following 47genes: APC*, ATM*, AXIN2*, BARD1*, BMPR1A*, BRCA1*, BRCA2*, BRIP1*, CDH1*, CDK4, CDKN2A (p14ARF), CDKN2A (p16INK4a), CHEK2*, CTNNA1*, DICER1*, EPCAM (Deletion/duplication testing only), GREM1 (promoter region deletion/duplication testing only), KIT, MEN1*, MLH1*, MSH2*, MSH3*, MSH6*, MUTYH*, NBN*, NF1*, NTHL1, PALB2*, PDGFRA, PMS2*, POLD1*, POLE*, PTEN*, RAD50*, RAD51C*, RAD51D*, SDHB*, SDHC*, SDHD*, SMAD4*, SMARCA4*, STK11*, TP53*, TSC1*, TSC2*, and VHL*.  The following genes were evaluated for sequence changes only: SDHA* and HOXB13 c.251G>A variant only. RNA analysis performed for * genes.  Based on [name]'s family history of a known gene mutation and cancer, she meets medical criteria for genetic testing. Despite that she meets criteria, there may still be an out of pocket cost. We discussed that the laboratory that completed testing for her daughter, [facility], would offer testing of the BRIP1 gene free of charge for [name] if testing is completed with in 150 days of her daughter's original report. [name] would prefer to have the panel testing given the family history of other cancers, but has some concerns regarding cost. We discussed the option to request an out of pocket estimate from the genetic testing laboratory before ordering testing. [name] is interested in this option.  PLAN:  In order to better  estimate the potential out of pocket cost for testing, we will request a benefits investigation from [facility] for analysis of the Common Hereditary Cancers + RNA panel. We will reach out to [name] via telephone once we hear back from [facility] to discuss whether she would like to proceed with panel testing, or if she would like to proceed with free familial variant testing only.   We will then place the genetic testing order through [facility] at [name]'s request. Results should then be available within approximately two-three weeks' time, at which point they will be disclosed by telephone to [name], as will any additional recommendations warranted by these results. [name] will receive a summary of her genetic counseling visit and a copy of her results once available. This information will also be available in Epic.   [name] questions were answered to her satisfaction today. Our contact information was provided should additional questions or concerns arise. Thank you for the referral and allowing [place] to share in the care of your patient.  Clint Guy, [place], [facility] Licensed, Certified Dispensing [email or web address] Phone: [phone]  The patient was seen for a total of 60 minutes in face-to-face genetic counseling.  This patient was discussed with Drs. Magrinat, Lindi Adie and/or Burr Medico who agrees with the above.    _______________________________________________________________________ For Office Staff:  Number of people involved in session: 1 Was an Intern/ student involved with case: no

## 2020-12-23 ENCOUNTER — Telehealth: Payer: Self-pay | Admitting: Genetic Counselor

## 2020-12-23 ENCOUNTER — Ambulatory Visit: Payer: PPO | Admitting: Family Medicine

## 2020-12-23 NOTE — Telephone Encounter (Signed)
Discussed that the estimated out of pocket cost for the panel genetic testing, if billed through insurance, is $0. [name] would like to proceed with this testing option. We will place the order and call her once we have the results.

## 2020-12-24 ENCOUNTER — Other Ambulatory Visit: Payer: Self-pay | Admitting: Family Medicine

## 2020-12-24 DIAGNOSIS — K529 Noninfective gastroenteritis and colitis, unspecified: Secondary | ICD-10-CM

## 2020-12-24 DIAGNOSIS — K580 Irritable bowel syndrome with diarrhea: Secondary | ICD-10-CM

## 2021-01-04 ENCOUNTER — Encounter: Payer: Self-pay | Admitting: Family Medicine

## 2021-01-04 DIAGNOSIS — G2581 Restless legs syndrome: Secondary | ICD-10-CM

## 2021-01-04 DIAGNOSIS — G894 Chronic pain syndrome: Secondary | ICD-10-CM

## 2021-01-04 DIAGNOSIS — M47896 Other spondylosis, lumbar region: Secondary | ICD-10-CM

## 2021-01-04 DIAGNOSIS — M160 Bilateral primary osteoarthritis of hip: Secondary | ICD-10-CM

## 2021-01-04 DIAGNOSIS — M48061 Spinal stenosis, lumbar region without neurogenic claudication: Secondary | ICD-10-CM

## 2021-01-04 MED ORDER — MORPHINE SULFATE ER 15 MG PO TBCR
15.0000 mg | EXTENDED_RELEASE_TABLET | Freq: Two times a day (BID) | ORAL | 0 refills | Status: DC
Start: 2021-01-04 — End: 2021-01-31

## 2021-01-04 MED ORDER — HYDROCODONE-ACETAMINOPHEN 10-325 MG PO TABS
1.0000 | ORAL_TABLET | Freq: Four times a day (QID) | ORAL | 0 refills | Status: DC | PRN
Start: 2021-01-04 — End: 2021-01-31

## 2021-01-04 NOTE — Telephone Encounter (Signed)
I have pended the morphine rx request to this encounter. However, the hydrocodone acetaminophen 7.5-325 is not on current med list.   Please advise.   Talbot Grumbling, RN

## 2021-01-17 DIAGNOSIS — Z1589 Genetic susceptibility to other disease: Secondary | ICD-10-CM

## 2021-01-17 DIAGNOSIS — Z1379 Encounter for other screening for genetic and chromosomal anomalies: Secondary | ICD-10-CM | POA: Insufficient documentation

## 2021-01-17 DIAGNOSIS — Z1501 Genetic susceptibility to malignant neoplasm of breast: Secondary | ICD-10-CM

## 2021-01-17 HISTORY — DX: Genetic susceptibility to malignant neoplasm of breast: Z15.01

## 2021-01-17 HISTORY — DX: Genetic susceptibility to other disease: Z15.89

## 2021-01-19 ENCOUNTER — Encounter: Payer: Self-pay | Admitting: Family Medicine

## 2021-01-19 ENCOUNTER — Encounter: Payer: Self-pay | Admitting: Genetic Counselor

## 2021-01-19 ENCOUNTER — Telehealth: Payer: Self-pay | Admitting: Genetic Counselor

## 2021-01-19 NOTE — Telephone Encounter (Signed)
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 We briefly reviewed cancer risks and recommendations for the BRIP1 gene. Offered to set up another genetic counseling appointment to discuss this result in greater detail - [name] is interested, but would like to schedule this at a later time. We will plan to give her a call on Friday (3/18) to talk more and set-up another appointment at that time. In the meantime, we will send her a copy of her test report.

## 2021-01-21 ENCOUNTER — Telehealth: Payer: Self-pay | Admitting: Genetic Counselor

## 2021-01-21 NOTE — Telephone Encounter (Signed)
Reviewed new questions that [name] has regarding the BRIP1 gene mutation since we last spoke. Set up a follow-up appointment on 02/01/21 at 2pm to discuss the BRIP1 gene in greater detail. [name] would like this to be a virtual appointment.

## 2021-01-31 ENCOUNTER — Other Ambulatory Visit: Payer: Self-pay | Admitting: Family Medicine

## 2021-01-31 ENCOUNTER — Encounter: Payer: Self-pay | Admitting: Family Medicine

## 2021-01-31 DIAGNOSIS — M160 Bilateral primary osteoarthritis of hip: Secondary | ICD-10-CM

## 2021-01-31 DIAGNOSIS — G894 Chronic pain syndrome: Secondary | ICD-10-CM

## 2021-01-31 DIAGNOSIS — M48061 Spinal stenosis, lumbar region without neurogenic claudication: Secondary | ICD-10-CM

## 2021-01-31 DIAGNOSIS — Z1231 Encounter for screening mammogram for malignant neoplasm of breast: Secondary | ICD-10-CM

## 2021-01-31 DIAGNOSIS — M47896 Other spondylosis, lumbar region: Secondary | ICD-10-CM

## 2021-01-31 DIAGNOSIS — G2581 Restless legs syndrome: Secondary | ICD-10-CM

## 2021-02-01 ENCOUNTER — Encounter: Payer: Self-pay | Admitting: Genetic Counselor

## 2021-02-01 ENCOUNTER — Inpatient Hospital Stay: Payer: PPO | Attending: Genetic Counselor | Admitting: Genetic Counselor

## 2021-02-01 ENCOUNTER — Other Ambulatory Visit: Payer: Self-pay | Admitting: Family Medicine

## 2021-02-01 DIAGNOSIS — E669 Obesity, unspecified: Secondary | ICD-10-CM

## 2021-02-01 DIAGNOSIS — E1169 Type 2 diabetes mellitus with other specified complication: Secondary | ICD-10-CM

## 2021-02-01 DIAGNOSIS — Z1589 Genetic susceptibility to other disease: Secondary | ICD-10-CM

## 2021-02-01 DIAGNOSIS — Z1379 Encounter for other screening for genetic and chromosomal anomalies: Secondary | ICD-10-CM | POA: Diagnosis not present

## 2021-02-01 DIAGNOSIS — Z1501 Genetic susceptibility to malignant neoplasm of breast: Secondary | ICD-10-CM

## 2021-02-01 DIAGNOSIS — E876 Hypokalemia: Secondary | ICD-10-CM

## 2021-02-01 DIAGNOSIS — K219 Gastro-esophageal reflux disease without esophagitis: Secondary | ICD-10-CM

## 2021-02-01 MED ORDER — MORPHINE SULFATE ER 15 MG PO TBCR: 15.0000 mg | EXTENDED_RELEASE_TABLET | Freq: Two times a day (BID) | ORAL | 0 refills | Status: DC

## 2021-02-01 MED ORDER — HYDROCODONE-ACETAMINOPHEN 10-325 MG PO TABS: 1.0000 | ORAL_TABLET | Freq: Four times a day (QID) | ORAL | 0 refills | Status: DC | PRN

## 2021-02-02 NOTE — Progress Notes (Signed)
GENETIC TEST RESULTS   Patient Name: [name] Patient Age: 70 y.o. Encounter Date: 02/01/2021  Referring Provider: McDiarmid, Blane Ohara, MD [address] [place],  [place] [address]    I connected with [name] on 02/01/2021 at 2:00 pm EDT by video conference and verified that I am speaking with the correct person using two identifiers.   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 Please refer to the prior Genetics clinic note for more information regarding [name]'s medical and family histories and our assessment at the time.    FAMILY HISTORY:  We obtained a detailed, 4-generation family history.  Significant diagnoses are listed below:  Family History  Problem Relation Age of Onset  . Diabetes Father   . Heart disease Father   . Diabetes type II Other   . Hypertension Other   . Heart attack Other   . Alcohol abuse Other   . Depression Other   . Asthma Other   . Migraines Other   . Hypertension Mother   . Dementia Mother   . Hypertension Sister   . Obesity Sister   . Kidney disease Brother   . Hypertension Daughter   . Hypertension Son   . Heart disease Brother   . Hypertension Brother   . Bipolar disorder Daughter   . Hypertension Daughter   . Breast cancer Daughter 62  . Cancer Daughter 59       vulvar cancer  . Other Daughter        BRIP1 gene mutation c.2010dup  . Hypertension Son   . Colon polyps Son 83  . Diabetes Maternal Aunt   . Lung cancer Paternal Aunt        d. >50  . Heart Problems Paternal Grandmother   . Diabetes Paternal Grandfather   . Cancer Paternal Aunt        unknown type, dx >50  . Cancer Paternal Great-grandmother        abdominal cancer NOS  . Breast cancer Other         paternal great-great aunt (PGF's aunt)  . Cancer Cousin 17       gynecologic cancer NOS (paternal first cousin)  . Cancer Niece 36       gynecologic cancer NOS   . Esophageal cancer Neg Hx   . Stomach cancer Neg Hx   . Rectal cancer Neg Hx   . Colon cancer Neg Hx     [name] has two daughters (ages 66 and 67) and two sons (ages 65 and 59). Her younger daughter was diagnosed with breast cancer and vulvar cancer in the past year, and had genetic testing that showed a pathogenic variant in the BRIP1 gene called c.2010dup. Her other daughter tested negative for the BRIP1 mutation. [name] youngest son has a history of a colon polyp that was cancerous at age 69. She had three brothers and one sister. Her sister's daughter died at age 26 from a gynecologic cancer.  [name] mother is alive at age 10 without cancer. There was one maternal aunt and two maternal uncles. There is no known cancer among maternal aunts/uncles or maternal cousins. [name] maternal grandmother died at age 30 without cancer. Her  maternal grandfather died in his late 29s without cancer.  [name]'s father died at age 61 without cancer. There were five paternal aunts and one paternal uncle. One aunt had lung cancer and another aunt had an unknown type of cancer. One cousin died at age 72 from a gynecologic cancer. [name] paternal grandmother died at age 48 without cancer. Her paternal grandfather died at age 63 without cancer. Her grandfather's mother died from an unknown type of cancer in her abdomen, and this great-grandmother had a sister who had breast cancer.  [name] is aware of previous family history of genetic testing for hereditary cancer risks. Patient's ancestors are of [place] and [place] descent. There is no reported Ashkenazi Jewish ancestry. There is no known consanguinity.  GENETIC TESTING:  At the time of [name]'s visit, we recommended she pursue genetic testing for  the specific BRIP1 mutation that was identified in the family. Genetic testing reported out on 01/17/2021 through the Common Hereditary Cancers + RNA Panel offered by [facility]. 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   The Common Hereditary Cancers Panel offered by [facility] includes sequencing and/or deletion duplication testing of the following 47genes: APC*, ATM*, AXIN2*, BARD1*, BMPR1A*, BRCA1*, BRCA2*, BRIP1*, CDH1*, CDK4, CDKN2A (p14ARF), CDKN2A (p16INK4a), CHEK2*, CTNNA1*, DICER1*, EPCAM (Deletion/duplication testing only), GREM1 (promoter region deletion/duplication testing only), KIT, MEN1*, MLH1*, MSH2*, MSH3*, MSH6*, MUTYH*, NBN*, NF1*, NTHL1, PALB2*, PDGFRA, PMS2*, POLD1*, POLE*, PTEN*, RAD50*, RAD51C*, RAD51D*, SDHB*, SDHC*, SDHD*, SMAD4*, SMARCA4*, STK11*, TP53*, TSC1*, TSC2*, and VHL*.  The following genes were evaluated for sequence changes only: SDHA* and HOXB13 c.251G>A variant only. RNA analysis performed for * genes. The test report will be scanned into EPIC and located under the Molecular Pathology section of the Results Review tab.  A portion of the result report is included below for reference.       BRIP1 CANCER RISKS & RECOMMENDATIONS:  We discussed that women who are carriers of a single pathogenic variant in the BRIP1 gene have an increased risk of ovarian cancer, and possibly breast cancer. The risk for ovarian cancer in these women is estimated to be up to 9.1% (compared to the general population ovarian cancer risk of approximately 1.3%). There is preliminary evidence supporting a correlation with BRIP1 and predisposition to breast cancer; however, the available evidence is insufficient to make a determination regarding this relationship. An individual with a BRIP1 pathogenic variant will not necessarily develop cancer in their lifetime, but the risk for cancer is increased over the general population risk.   Based on  current understanding, men don't appear to have increased cancer risks due to BRIP1 mutations. However, men can carry the mutation and pass it on to their children. Additionally, information regarding female cancer risk could change as more research is done to understand BRIP1 mutations.   Ovarian Cancer Management:  The National Comprehensive Cancer Network (NCCN) recommends consideration of prophylactic salpingo-oophorectomy (surgical removal of the ovaries and fallopian tubes) for women with a pathogenic variant in BRIP1 after childbearing is complete. The current evidence is insufficient to make a firm recommendation as to the optimal age for this procedure. However, based on the current, limited evidence base, a discussion about surgery should be held around 70-35 years of age or earlier based on a specific family history of early-onset ovarian cancer (Artist. Genetic/Familial High-Risk Assessment: Breast, Ovarian, and Pancreatic; Version 2.2022). Women electing to defer prophylactic oophorectomy can consider screening  with serum CA-125 and transvaginal ultrasound; however, data do not support such screening and it should not be a substitute for preventive surgery.   [name] plans to discuss this information further with her PCP, Dr. Wendy Poet, at her next appointment (scheduled 03/03/2021).   Breast Cancer Management:  The current NCCN guidelines do not recommend additional breast cancer screening for individuals with a single pathogenic BRIP1 variant beyond what is recommended for the general population. However, they caution that cancer screening should ultimately be guided by personal and family history (Artist. Genetic/Familial High-Risk Assessment: Breast, Ovarian, and Pancreatic; Version 2.2022).   An individual's cancer risk and medical management are not determined by genetic test results alone. Overall cancer risk assessment  incorporates additional factors, including personal medical history, family history, and any available genetic information that may result in a personalized plan for cancer prevention and surveillance.   Inheritance & Risk for Family Members:  Hereditary predisposition to cancer due to a single pathogenic variant in the BRIP1 gene has autosomal dominant inheritance. This means that an individual with a pathogenic variant has a 50% (1 in 2) chance of passing the condition on to their offspring. Once such a variant is detected in an individual, it is possible to identify at-risk relatives who can pursue testing for this specific familial variant.   It is important that all of [name]'s relatives (both men and women) know of the presence of this gene mutation. Genetic testing can sort out who in the family is at risk and who is not. [name] children and siblings each have a 50% (1 in 2) chance to have inherited the mutation found in her. We recommend they have genetic testing for this same mutation, as identifying the presence of this mutation would allow them to also take advantage of risk-reducing measures.   We would be happy to help meet with and coordinate genetic testing for any relative that is interested. To locate genetic counselors in other cities, [name] or other family members can visit the website '[email or web address]' and search for a cancer genetic counselor by zip code.   Additionally, individuals with a pathogenic variant in BRIP1 are carriers of Fanconi anemia type J. Fanconi anemia is an autosomal recessive disorder that is characterized by bone marrow failure and variable presentation of anomalies, including short stature, abnormal skin pigmentation, abnormal thumbs, malformations of the skeletal and central nervous systems, and developmental delay (PMID: 9147829, 56213086). Risks for leukemia and early onset solid tumors are significantly elevated (PMID: 57846962,  95284132, 44010272). For there to be a risk of Fanconi anemia in offspring, both parents would each have to carry a pathogenic variant in [place]; in such a case, the risk of having an affected child is 25%.   SUPPORT AND RESOURCES:  If [name] is interested in information and support, there are two groups, Facing Our Risk (www.facingourrisk.com) and Bright Pink (www.brightpink.org) which some people have found useful when identified with a hereditary risk for ovarian cancer. They provide opportunities to speak with other individuals from high-risk families.  Lastly, we discussed that our knowledge of cancer risks related to BRIP1 mutations will continue to evolve. We encouraged Ms. Gonsalez to remain in contact with [place] on an annual basis so we can update her personal and family histories, and let her know of advances in cancer genetics that may benefit the family. Our contact number was provided. Ms. Crownover questions were answered to her satisfaction today, and she knows she  is welcome to call anytime with additional questions.    Clint Guy, [place], [facility] Licensed, Certified Dispensing [email or web address] Phone: [phone]   The patient was seen for a total of 60 minutes in face-to-face genetic counseling.  A genetic counseling intern was involved in this appointment.

## 2021-02-05 ENCOUNTER — Other Ambulatory Visit: Payer: Self-pay | Admitting: Family Medicine

## 2021-02-05 DIAGNOSIS — E039 Hypothyroidism, unspecified: Secondary | ICD-10-CM

## 2021-03-03 ENCOUNTER — Ambulatory Visit (INDEPENDENT_AMBULATORY_CARE_PROVIDER_SITE_OTHER): Payer: PPO | Admitting: Family Medicine

## 2021-03-03 ENCOUNTER — Encounter: Payer: Self-pay | Admitting: Family Medicine

## 2021-03-03 ENCOUNTER — Telehealth: Payer: Self-pay | Admitting: *Deleted

## 2021-03-03 ENCOUNTER — Other Ambulatory Visit: Payer: Self-pay

## 2021-03-03 VITALS — BP 126/62 | HR 64 | Ht 64.0 in | Wt 248.1 lb

## 2021-03-03 DIAGNOSIS — G2581 Restless legs syndrome: Secondary | ICD-10-CM | POA: Diagnosis not present

## 2021-03-03 DIAGNOSIS — F3181 Bipolar II disorder: Secondary | ICD-10-CM | POA: Diagnosis not present

## 2021-03-03 DIAGNOSIS — K580 Irritable bowel syndrome with diarrhea: Secondary | ICD-10-CM | POA: Diagnosis not present

## 2021-03-03 DIAGNOSIS — I152 Hypertension secondary to endocrine disorders: Secondary | ICD-10-CM

## 2021-03-03 DIAGNOSIS — Z1589 Genetic susceptibility to other disease: Secondary | ICD-10-CM

## 2021-03-03 DIAGNOSIS — M48061 Spinal stenosis, lumbar region without neurogenic claudication: Secondary | ICD-10-CM | POA: Diagnosis not present

## 2021-03-03 DIAGNOSIS — N3946 Mixed incontinence: Secondary | ICD-10-CM | POA: Diagnosis not present

## 2021-03-03 DIAGNOSIS — E1159 Type 2 diabetes mellitus with other circulatory complications: Secondary | ICD-10-CM

## 2021-03-03 DIAGNOSIS — Z1501 Genetic susceptibility to malignant neoplasm of breast: Secondary | ICD-10-CM | POA: Diagnosis not present

## 2021-03-03 DIAGNOSIS — R413 Other amnesia: Secondary | ICD-10-CM

## 2021-03-03 DIAGNOSIS — E559 Vitamin D deficiency, unspecified: Secondary | ICD-10-CM

## 2021-03-03 DIAGNOSIS — M47896 Other spondylosis, lumbar region: Secondary | ICD-10-CM | POA: Diagnosis not present

## 2021-03-03 DIAGNOSIS — F5104 Psychophysiologic insomnia: Secondary | ICD-10-CM | POA: Diagnosis not present

## 2021-03-03 DIAGNOSIS — G894 Chronic pain syndrome: Secondary | ICD-10-CM

## 2021-03-03 DIAGNOSIS — G4700 Insomnia, unspecified: Secondary | ICD-10-CM | POA: Insufficient documentation

## 2021-03-03 DIAGNOSIS — M160 Bilateral primary osteoarthritis of hip: Secondary | ICD-10-CM

## 2021-03-03 HISTORY — DX: Insomnia, unspecified: G47.00

## 2021-03-03 MED ORDER — HYDROCODONE-ACETAMINOPHEN 10-325 MG PO TABS: 1.0000 | ORAL_TABLET | Freq: Four times a day (QID) | ORAL | 0 refills | Status: DC | PRN

## 2021-03-03 MED ORDER — LOPERAMIDE HCL 2 MG PO CAPS: 2.0000 mg | ORAL_CAPSULE | ORAL | 0 refills | Status: DC | PRN

## 2021-03-03 MED ORDER — MORPHINE SULFATE ER 15 MG PO TBCR: 15.0000 mg | EXTENDED_RELEASE_TABLET | Freq: Two times a day (BID) | ORAL | 0 refills | Status: DC

## 2021-03-03 NOTE — Telephone Encounter (Signed)
Spoke with the patient and scheduled a new patient appt with Dr Berline Lopes on 5/3 at 1:45 pm. Patient given an arrival time of 1:15 pm. Patient aware of the address and phone number. Patient given the policy for mask and visitors

## 2021-03-03 NOTE — Patient Instructions (Addendum)
When you forget to take your Levothyroxine, do not take anything by mouth for two hours, take the Levothyroxine, then wait half-hour to an hour before eating or taking any other medicines.   A referral to GYN oncology was made.  Let us know if you do not hear from them in a week.   Stop taking the daily Aspirin.  We are recommending that only patient's who have had a heart attack or stroke take a daily medication.

## 2021-03-04 ENCOUNTER — Encounter: Payer: Self-pay | Admitting: Gynecologic Oncology

## 2021-03-04 ENCOUNTER — Encounter: Payer: Self-pay | Admitting: Family Medicine

## 2021-03-04 ENCOUNTER — Telehealth: Payer: Self-pay | Admitting: *Deleted

## 2021-03-04 DIAGNOSIS — R413 Other amnesia: Secondary | ICD-10-CM | POA: Insufficient documentation

## 2021-03-04 DIAGNOSIS — Z0189 Encounter for other specified special examinations: Secondary | ICD-10-CM

## 2021-03-04 HISTORY — DX: Other amnesia: R41.3

## 2021-03-04 NOTE — Assessment & Plan Note (Signed)
Given her increase risk of ovarian cancer with the monoallelic BRIP1 gene mutation. A referral was placed for GYN Oncology to discuss prophylactic oophorectomy.  Patient reports having a preserved ovary after hysterectomy with single oophorectomy.

## 2021-03-04 NOTE — Assessment & Plan Note (Signed)
Established problem Adequate analgesia. No adverse effects. Able to complete ADLs with use of MS Contin 15 mg twice a day and prn Norco No aberrant behaviors - [place] CSRS checked.   Continue current therapy regiment. Refill or MS Contin and Norco for one month sent into pharmacy

## 2021-03-04 NOTE — Assessment & Plan Note (Signed)
New concern Difficulty maintaining topic attention during speaking Failed MiniCog (2/3 recall and failed clock draw)  On several medications, including bentyl and hydroxyzine, that could interfere with cognitive abilities  For further evaluations

## 2021-03-04 NOTE — Telephone Encounter (Signed)
-----   Message from Blane Ohara McDiarmid, MD sent at 03/04/2021 10:02 AM EDT ----- Regarding: Schedule patient in [place] [name] Please schedule [name] in geri clinic for memory difficulty eval.

## 2021-03-04 NOTE — Assessment & Plan Note (Signed)
Established problem. Stable.  No signs of complications, medication side effects, or red flags. Continue current medications and other regiments.  

## 2021-03-04 NOTE — Telephone Encounter (Signed)
Patient has been scheduled for 04-28-2021 at 2pm.  She was informed over the phone and voiced understanding.  Pt is agreeable to CCM prior to visit.  Quavis Klutz,CMA

## 2021-03-04 NOTE — Progress Notes (Signed)
[name] is alone Sources of clinical information for visit is/are patient and past medical records. Nursing assessment for this office visit was reviewed with the patient for accuracy and revision.     Previous Report(s) Reviewed: office notes and PDMP  Depression screen [facility] 2/9 03/03/2021  Decreased Interest 1  Down, Depressed, Hopeless 1  PHQ - 2 Score 2  Altered sleeping 3  Tired, decreased energy 3  Change in appetite 1  Feeling bad or failure about yourself  3  Trouble concentrating 1  Moving slowly or fidgety/restless 0  Suicidal thoughts 2  PHQ-9 Score 15  Difficult doing work/chores -  Some recent data might be hidden    Fall Risk  03/03/2021 12/02/2020 09/16/2020 04/01/2020 03/17/2020  Falls in the past year? 0 0 0 0 0  Number falls in past yr: 0 0 - 0 -  Injury with Fall? 0 0 - - -  Risk Factor Category  - - - - -  Risk for fall due to : - Impaired balance/gait;Impaired mobility - - -  Follow up - Falls evaluation completed - - -    PHQ9 SCORE ONLY 03/03/2021 12/06/2020 12/02/2020  PHQ-9 Total Score 15 15 12     Adult vaccines due  Topic Date Due  . TETANUS/TDAP  10/21/2023    There are no preventive care reminders to display for this patient.    History/P.E. limitations: none  Adult vaccines due  Topic Date Due  . TETANUS/TDAP  10/21/2023    Diabetes Health Maintenance Due  Topic Date Due  . HEMOGLOBIN A1C  06/01/2021  . OPHTHALMOLOGY EXAM  08/26/2021  . FOOT EXAM  09/16/2021   There are no preventive care reminders to display for this patient.   Chief Complaint  Patient presents with  . Follow-up

## 2021-03-07 ENCOUNTER — Telehealth: Payer: Self-pay | Admitting: *Deleted

## 2021-03-07 NOTE — Chronic Care Management (AMB) (Signed)
  Care Management   Note  03/07/2021 Name: [name] MRN: [id number] DOB: July 03, 1951  [name] is a 70 y.o. year old female who is a primary care patient of McDiarmid, Blane Ohara, MD. I reached out to [name] by phone today in response to a referral sent by [name]'s PCP, McDiarmid, Blane Ohara, MD .    [name] was given information about care management services today including:  1. Care management services include personalized support from designated clinical staff supervised by her physician, including individualized plan of care and coordination with other care providers 2. 24/7 contact phone numbers for assistance for urgent and routine care needs. 3. The patient may stop care management services at any time by phone call to the office staff.  Patient agreed to services and verbal consent obtained.   Follow up plan: Telephone appointment with care management team member scheduled for:04/12/2021  Noe Goyer  Care Guide, Embedded Care Coordination [place]  Care Management  Direct Dial: [phone]

## 2021-03-08 ENCOUNTER — Encounter: Payer: Self-pay | Admitting: Gynecologic Oncology

## 2021-03-08 ENCOUNTER — Other Ambulatory Visit: Payer: Self-pay

## 2021-03-08 ENCOUNTER — Inpatient Hospital Stay: Payer: PPO | Attending: Gynecologic Oncology | Admitting: Gynecologic Oncology

## 2021-03-08 VITALS — BP 147/49 | HR 54 | Temp 96.8°F | Resp 18 | Wt 246.0 lb

## 2021-03-08 DIAGNOSIS — Z148 Genetic carrier of other disease: Secondary | ICD-10-CM | POA: Diagnosis not present

## 2021-03-08 DIAGNOSIS — Z1509 Genetic susceptibility to other malignant neoplasm: Secondary | ICD-10-CM | POA: Diagnosis not present

## 2021-03-08 DIAGNOSIS — Z1501 Genetic susceptibility to malignant neoplasm of breast: Secondary | ICD-10-CM | POA: Diagnosis not present

## 2021-03-08 DIAGNOSIS — G8929 Other chronic pain: Secondary | ICD-10-CM | POA: Insufficient documentation

## 2021-03-08 DIAGNOSIS — F319 Bipolar disorder, unspecified: Secondary | ICD-10-CM | POA: Insufficient documentation

## 2021-03-08 DIAGNOSIS — Z1502 Genetic susceptibility to malignant neoplasm of ovary: Secondary | ICD-10-CM

## 2021-03-08 DIAGNOSIS — Z79899 Other long term (current) drug therapy: Secondary | ICD-10-CM | POA: Insufficient documentation

## 2021-03-08 DIAGNOSIS — Z79891 Long term (current) use of opiate analgesic: Secondary | ICD-10-CM | POA: Diagnosis not present

## 2021-03-08 DIAGNOSIS — M797 Fibromyalgia: Secondary | ICD-10-CM | POA: Insufficient documentation

## 2021-03-08 DIAGNOSIS — J449 Chronic obstructive pulmonary disease, unspecified: Secondary | ICD-10-CM | POA: Diagnosis not present

## 2021-03-08 DIAGNOSIS — Z780 Asymptomatic menopausal state: Secondary | ICD-10-CM | POA: Insufficient documentation

## 2021-03-08 DIAGNOSIS — E119 Type 2 diabetes mellitus without complications: Secondary | ICD-10-CM | POA: Insufficient documentation

## 2021-03-08 DIAGNOSIS — Z803 Family history of malignant neoplasm of breast: Secondary | ICD-10-CM | POA: Diagnosis not present

## 2021-03-08 DIAGNOSIS — M549 Dorsalgia, unspecified: Secondary | ICD-10-CM | POA: Diagnosis not present

## 2021-03-08 DIAGNOSIS — F1290 Cannabis use, unspecified, uncomplicated: Secondary | ICD-10-CM | POA: Diagnosis not present

## 2021-03-08 DIAGNOSIS — Z1589 Genetic susceptibility to other disease: Secondary | ICD-10-CM

## 2021-03-08 NOTE — Patient Instructions (Addendum)
It was a pleasure meeting you today.  We will have you come back in mid June to have a before surgery visit with [name], my nurse practitioner.  We will plan for surgery at the end of June (April 26, 2021) but can always readjust if this ends up not being good for your schedule.  We will plan to robotically remove what ever fallopian tube and ovarian tissue you have left given your increased lifetime risk of ovarian and fallopian tube cancer because of your BRIP1 mutation.

## 2021-03-08 NOTE — Progress Notes (Signed)
GYNECOLOGIC ONCOLOGY NEW PATIENT CONSULTATION   Patient Name: [name]  Patient Age: 70 y.o. 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  I reviewed genetic testing and implications in terms of cancer risk. With regard to gynecologic malignancy, a mutation in BRIP1 appears to carry up to an approximately 10% lifetime risk of ovarian cancer.  The National Comprehensive Cancer Network recommends consideration of risk reducing surgery with bilateral salpingo-oophorectomy starting at the age of 57-50.  Given the patient's age (47) and likely unilateral salpingo-oophorectomy, I would guess that her risk of ovarian/fallopian tube cancer is less than the lifetime risk noted in BRIP1 mutation carries. That being said, it is likely still elevated above the population baseline risk.  We discussed the difficulty with ovarian cancer, notably that we do not have a screening test for cancer and that frequently, ovarian cancer is diagnosed at an advanced stage. While there is no screening test, in patients who are not surgical candidates or would like to defer risk-reducing surgery, we can offer surveillance with pelvic ultrasounds, physical exams, and CA-125.  I think the patient is a surgical candidate, and at this time, after discussing risks (risk of surgery, in the setting of prior surgeries, and other comorbidities) and benefits of surgery versus surveillance, the patient would like to schedule surgery over the summer. We have tentatively selected a late June date for surgery. She will return to see [name], NP in mid-June for a pre-op appointment. The plan will be for robotic USO vs BSO (if any contralateral tissue in situ), possible  laparotomy, possible staging (we discussed small but present risk of occult cancer at time of risk-reducing surgery).  Given her chronic pain and chronic narcotic use, I will reach out to her PCP regarding peri-operative pain management.   A copy of this note was sent to the patient's referring provider.   65 minutes of total time was spent for this patient encounter, including preparation, face-to-face counseling with the patient and coordination of care, and documentation of the encounter.  Jeral Pinch, MD  Division of Gynecologic Oncology  Department of Obstetrics and Gynecology  [facility]  ___________________________________________  Chief Complaint: Chief Complaint  Patient presents with  . Monoallelic mutation of BRIP1 gene    History of Present Illness:  [name] is a 70 y.o. y.o. female who is seen in consultation at the request of McDiarmid, Blane Ohara, MD for an evaluation of newly diagnosed pathogenic BRIP1 mutation.  The patient reports that her daughter, recently diagnosed with both early stage of vulvar as well as breast cancer, tested positive for a BRIP1 mutation on genetic testing.  Subsequently, the patient had tested herself and was found to be positive for a pathogenic mutation.  He has multiple family members with gynecologic cancer and including her paternal great grandmother and her niece, who is deceased from ovarian cancer.  Patient also notes that there is a history of breast cancer on her father side of the family.  Another the patient's daughters has been tested and is negative for a mutation in [place].  Patient's sister and another niece are currently undergoing testing.  Patient endorses having a good appetite although seems to be hungry only at night.  She has struggled with  significant depression and many years ago had multiple suicide attempts.  She notes being in bed a lot secondary to her depression and anxiety.  She  denies any nausea or emesis.  She reports regular bowel and bladder function.  She has some irritable bowel symptoms including intermittent constipation and diarrhea.  Patient went through menopause at approximately age 57.  She had a hysterectomy in 1982 although does not remember the reason for this.  She had 1 ovary removed at that time.  Her surgical history is also notable for an ectopic pregnancy requiring surgery as well as an ovarian cyst removal when she was young that she describes as being "attached to her intestines".  Her medical history is notable for significant depression and bipolar disorder, chronic pain (patient has joint/back pain as well as fibromyalgia) -she takes a narcotic at night for restless leg syndrome and opiates during the day for her chronic pain, type 2 diabetes mellitus (has continuous glucose monitor, diet controlled, last HgbA1c on 12/02/2020 6.2%).  Patient lives in [place] with her daughter.  She is retired (on disability) after working for [employer] for 25 years.  She has a history of tobacco use but quit in 2009.  She denies any alcohol use but endorses some THC use in the evenings.  [facility]: Genetic testing 12/21/2020 showed a pathogenic variant in the BRIP1 gene.  PAST MEDICAL HISTORY:  Past Medical History:  Diagnosis Date  . Adrenal adenoma, left 12/02/2020   CT AP 11/16/20:  left adrenal mass measuring approximately 2.7 cm. This is minimally increased in size from prior study and is consistent with a benign adrenal adenoma.   . Adrenal nodule ([place]) 04/08/2011   04/08/11 Abdominal CT showed Left adrenal nodule which is tachnically indeterminate but most likely an adenoma.  It is 1.7 cm and 42 HU on portal venous phase image. 31 HU on delayed images.   01/09/2012 Abdominal CT w/ & w/o CM: Stable benign left adrenal adenoma. No further specific follow-up is needed for this finding.    . Allergic rhinitis 01/03/2007   Qualifier: Diagnosis of  By: Drucie Ip    . Allergy   . Anemia    hx of  in 20's  . Anxiety   . Bipolar disorder ([place])   . Carpal tunnel syndrome 02/14/2007   S/P carpel tunnel release bilaterally.    . Cataracts, bilateral    removed both eyes  2017-2018  . Chronic pain syndrome 12/24/2008  . 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 COPD WITHOUT EXACERBATION 01/03/2007   Qualifier: Diagnosis of  By: Drucie Ip  emphysema  . Coronary artery calcification seen on CAT scan 12/03/2017   11/2017 Chest CT: Left anterior descending and right coronary atherosclerosis  . Diabetes mellitus without complication (HCC)    U7M 6.5 as of 05-2019- diet changes, no meds currently   . DISC WITH RADICULOPATHY 01/03/2007   Qualifier: Diagnosis of  By: Drucie Ip    . Emphysema of lung ([place])   . Esophageal reflux 01/03/2007   Centricity Description: GASTROESOPHAGEAL REFLUX, NO ESOPHAGITIS Qualifier: Diagnosis of  By: Drucie Ip   Centricity Description: GERD Qualifier: Diagnosis of  By: Tye Savoy MD, Tommi Rumps    . External hemorrhoids s/p hemorrhoidectomy 03/28/2018 03/28/2018  . Family history of breast cancer   . Family history of cancer of female genital organ   . Family history of colon cancer   . Family history of gene mutation  BRIP1 gene mutation ([id number]) in daughter  . Family history of lung cancer   . Fibromyalgia   . Fracture of bone spur of inferior portion of calcaneus 10/06/2018  . Functional gastrointestinal disorder 06/18/2008   Functional Gastrointestinal Disorder with frequent eructation and bloating sensation with acute flares.  Responsive to Compazine and Bentyl.  Takes a few days to calm down.    Marland Kitchen GERD without esophagitis 01/03/2007   Centricity Description: GASTROESOPHAGEAL REFLUX, NO ESOPHAGITIS Qualifier: Diagnosis of  By: Drucie Ip   Centricity Description: GERD Qualifier: Diagnosis of  By: Tye Savoy MD, Tommi Rumps    . Heel spur, fracture, left  04/17/2018  . HEMORRHOIDS, NOS 01/03/2007   Qualifier: Diagnosis of  By: Drucie Ip    . Hepatic steatosis 04/12/2011  . Herpes simplex labialis 05/11/2011  . Hip osteoarthritis 05/23/2007   MRI Pelvis (03/13/07) Bilateral osteoarthritis of the hips, left greater than right.  The  patient has a slightly more prominent hip effusion on the left than  the right.  No other significant abnormality.    . History of MRSA infection 04/28/2011  . HYPERCHOLESTEROLEMIA 12/24/2008  . Hypertension   . HYPERTENSION, BENIGN SYSTEMIC 01/03/2007   Qualifier: Diagnosis of  By: Drucie Ip    . HYPERTRIGLYCERIDEMIA 11/11/2007   Qualifier: Diagnosis of  By: McDiarmid MD, Sherren Mocha    . Hypokalemia 12/12/2017  . Hypothyroidism 02/15/2007   Qualifier: Diagnosis of  By: McDiarmid MD, Sherren Mocha    . HYPOTHYROIDISM, BORDERLINE 02/15/2007  . Irritable bowel syndrome 01/03/2007   Qualifier: Diagnosis of  By: Drucie Ip    . LACTOSE INTOLERANCE 03/10/2008  . Major depressive disorder, recurrent episode ([place]) 01/03/2007   Qualifier: Diagnosis of  By: Drucie Ip    . Medication management 12/14/2016   Managing topiramate for weight loss starting 12/14/16  . Obesity (BMI 30.0-34.9) 09/30/2007   Has lost 55 lbs since easter. Goal of <190 by 08/05/12.     . Obesity, Class III, BMI 40-49.9 (morbid obesity) ([place]) 09/30/2007       . Osteoarthritis of spine 01/03/2007   MR I Lumbar (08/03): GeneralDDD; L2-3 Large  HNP.  Mod pos  hnp - 12/14/2005, smhnpw/irritL4root - 12/14/2005    . OSTEOARTHRITIS, HANDS, BILATERAL 06/18/2008   Qualifier: Diagnosis of  By: McDiarmid MD, Sherren Mocha    . PATELLO FEMORAL STRESS SYNDROME 01/03/2007   Qualifier: Diagnosis of  By: Drucie Ip    . Periodic limb movements of sleep 12/05/2010   Diagnosed on Polysomnography testing Fall 2011.     Marland Kitchen Pneumonia    walking pneumonia in the 90's  . Pre-diabetes 12/25/2008   Qualifier: Diagnosis of  By: McDiarmid MD, Sherren Mocha    . Prolapsed internal hemorrhoids, grade 3, s/p  ligation/pexy/hemorrhoidectomy 03/28/2018 03/28/2018  . RESTLESS LEGS SYNDROME 09/11/2007   Polysomnography (10/2010, Dr Keturah Barre, interpreter. ) showed periodic limb movements that frequently awoke patient from sleep with arousal index of 4 per hour. Dr Danton Sewer (Sleep Specialist) thought her RLS was the major contributor to patient poor sleep condition, more than any sleep apnea.  He recommended considering opamine agonist (either requip or mirapex) to see if this will help.     . Rosacea, acne 10/24/2011  . Solitary lung nodule 04/08/2011  . Stage 1 mild COPD by GOLD classification ([place]) 01/03/2007   11/28/2017 Chest CT: Mild centrilobular and paraseptal emphysema with mild diffuse bronchial wall thickening    . Stress incontinence   . Synovial cyst of lumbar facet joint 02/17/2016  .  Trigger thumb of left hand 08/16/2018  . Urticaria, chronic 05/22/2011  . VITAMIN D DEFICIENCY 03/03/2010  . Work-related condition 05/17/2018     PAST SURGICAL HISTORY:  Past Surgical History:  Procedure Laterality Date  . APPENDECTOMY    . bladder tack  1998   Dr Jeffie Pollock (urology)  . BUNIONECTOMY  1992   bilateral feet with pins in great toes  . CARDIAC CATHETERIZATION    . CARPAL TUNNEL RELEASE  2010   Bilateral, Dr Theodis Sato  . COLONOSCOPY  2004   Dr Verdia Kuba (GI)  . CYSTOCELE REPAIR  1998    Dr Gertie Fey (GYN)-  . ESOPHAGOGASTRODUODENOSCOPY    . EVALUATION UNDER ANESTHESIA WITH HEMORRHOIDECTOMY N/A 03/28/2018   Procedure: ANORECTAL EXAM UNDER ANESTHESIA lateral internal partial sphincterotomy, hemorrhoidal ligation pixie, HEMORRHOIDECTOMY;  Surgeon: Michael Boston, MD;  Location: [facility];  Service: General;  Laterality: N/A;  . EXAM ANORECTAL W/ ULTRASOUND     Dr. Johney Maine 03-28-18   . EXPLORATORY LAPAROTOMY    . FACIAL LACERATIONS REPAIR     Facial Laceration repair as adolescent   . INJECTION HIP INTRA ARTICULAR  2008   Left Hip fluoroscopically-guided corticosteorid injection for painful  osteoarthritis with good effect (06/2007)  . LAPAROSCOPY FOR ECTOPIC PREGNANCY    . LUMBAR LAMINECTOMY/DECOMPRESSION MICRODISCECTOMY N/A 06/21/2016   Procedure: MICRO LUMBAR DECOMPRESSION L4-L5 AND REMOVAL OF SYNOVIAL CYST    1 LEVEL;  Surgeon: Susa Day, MD;  Location: [facility];  Service: Orthopedics;  Laterality: N/A;  . NM MYOVIEW LTD  2011   Dr Daneen Schick, III (Card)  . RECTOCELE REPAIR  1998   Dr Gertie Fey (GYN)  . SPHINCTEROTOMY N/A 03/28/2018   Procedure: ANAL SPHINCTEROTOMY;  Surgeon: Michael Boston, MD;  Location: [facility];  Service: General;  Laterality: N/A;  . UPPER GASTROINTESTINAL ENDOSCOPY      OB/GYN HISTORY:  OB History  Gravida Para Term Preterm AB Living  5 4          SAB IAB Ectopic Multiple Live Births               # Outcome Date GA Lbr Len/2nd Weight Sex Delivery Anes PTL Lv  5 Gravida           4 Para           3 Para           2 Para           1 Para             No LMP recorded. Patient has had a hysterectomy.  Age at menarche: 65 Age at menopause: 55 Hx of HRT: Tried briefly, but secondary to side effects did not stay on this long-term Hx of STDs: Yes Last pap: 03/2020 NIML, HR HPV negative History of abnormal pap smears: No  SCREENING STUDIES:  Last mammogram: 03/2020  Last colonoscopy: 06/2019 Last bone mineral density: 2017  MEDICATIONS: Outpatient Encounter Medications as of 03/08/2021  Medication Sig  . atorvastatin (LIPITOR) 20 MG tablet TAKE 1 TABLET BY MOUTH EVERY DAY  . Calcium Carb-Cholecalciferol 1000-800 MG-UNIT TABS Take by mouth.  . citalopram (CELEXA) 40 MG tablet Take 0.5 tablets (20 mg total) by mouth at bedtime. (Patient taking differently: Take 40 mg by mouth daily.)  . Continuous Blood Gluc Sensor (FREESTYLE LIBRE 14 DAY SENSOR) MISC APPLY EVERY 14 DAYS.  Marland Kitchen darifenacin (ENABLEX) 7.5 MG 24 hr tablet TAKE 1 TABLET BY MOUTH EVERY DAY  . dicyclomine (BENTYL)  20 MG tablet Take 20 mg by mouth every 6 (six) hours.  . fexofenadine  (ALLEGRA) 180 MG tablet Take 180 mg by mouth daily.  . fluticasone (FLONASE) 50 MCG/ACT nasal spray Place 2 sprays into both nostrils daily.  . hydrochlorothiazide (HYDRODIURIL) 25 MG tablet TAKE 1 TABLET BY MOUTH EVERY DAY  . HYDROcodone-acetaminophen (NORCO) 10-325 MG tablet Take 1 tablet by mouth every 6 (six) hours as needed.  . hydrOXYzine (VISTARIL) 50 MG capsule Take 150 mg by mouth at bedtime.   Marland Kitchen ketoconazole (NIZORAL) 2 % cream APPLY TOPICALLY TO AFFECTED AREA EVERY DAY  . KLOR-CON M10 10 MEQ tablet TAKE 4 TABLETS (40 MEQ TOTAL) BY MOUTH AT BEDTIME.  . Lactobacillus-Inulin (CULTURELLE DIGESTIVE DAILY PO) Take by mouth.  . levothyroxine (SYNTHROID) 125 MCG tablet TAKE 1 TABLET (125 MCG TOTAL) BY MOUTH EVERY MORNING. [place]  . loperamide (IMODIUM A-D) 2 MG capsule Take 1 capsule (2 mg total) by mouth as needed for diarrhea or loose stools.  Marland Kitchen LORazepam (ATIVAN) 1 MG tablet Take 1 mg by mouth 3 (three) times daily.  . metoprolol tartrate (LOPRESSOR) 50 MG tablet TAKE 1 TABLET BY MOUTH TWICE A DAY  . morphine (MS CONTIN) 15 MG 12 hr tablet Take 1 tablet (15 mg total) by mouth every 12 (twelve) hours.  . Multiple Minerals-Vitamins (CALCIUM-MAGNESIUM-ZINC-D3) TABS Take 3 tablets by mouth daily.  Marland Kitchen omeprazole (PRILOSEC) 40 MG capsule TAKE 1 CAPSULE BY MOUTH EVERY DAY  . polyethylene glycol powder (GLYCOLAX/MIRALAX) 17 GM/SCOOP powder Take 17 g by mouth as needed for mild constipation.  . prazosin (MINIPRESS) 2 MG capsule Take 2 mg by mouth at bedtime.  . prochlorperazine (COMPAZINE) 10 MG tablet TAKE ONE TABLET BY MOUTH TWICE A DAY AS NEEDED FOR NAUSEA OR VOMITING  . ramipril (ALTACE) 10 MG capsule TAKE 1 CAPSULE BY MOUTH EVERY DAY  . Simethicone (GAS-X PO) Take 2 tablets by mouth daily as needed (gas).  . traZODone (DESYREL) 100 MG tablet Take 3 tablets (300 mg total) by mouth at bedtime.  . vitamin B-12 (CYANOCOBALAMIN) 1000 MCG tablet Take 1,000 mcg by mouth daily.  .  [DISCONTINUED] dicyclomine (BENTYL) 20 MG tablet Take 1 tablet (20 mg total) by mouth every 8 (eight) hours as needed for spasms.  . [DISCONTINUED] prazosin (MINIPRESS) 1 MG capsule Take 2 mg by mouth at bedtime.   No facility-administered encounter medications on file as of 03/08/2021.    ALLERGIES:  Allergies  Allergen Reactions  . Diclofenac-Misoprostol Shortness Of Breath    elevated blood pressure  . Aripiprazole     Doesn't want to take because of possible side effects  . Emsam [Selegiline] Other (See Comments)    Burned the skin after using for a year  . Estradiol Swelling  . Ibuprofen Hives  . Naproxen Nausea Only  . Neurontin [Gabapentin] Hives and Swelling  . Paroxetine Other (See Comments)    Panic attacks  . Prednisone Other (See Comments)    Severe depression  . Tape Other (See Comments)    Paper tape caused blisters, plastic tape ok     FAMILY HISTORY:  Family History  Problem Relation Age of Onset  . Diabetes Father   . Heart disease Father   . Diabetes type II Other   . Hypertension Other   . Heart attack Other   . Alcohol abuse Other   . Depression Other   . Asthma Other   . Migraines Other   . Hypertension Mother   .  Dementia Mother   . Hypertension Sister   . Obesity Sister   . Kidney disease Brother   . Hypertension Daughter   . Hypertension Son   . Heart disease Brother   . Hypertension Brother   . Bipolar disorder Daughter   . Hypertension Daughter   . Breast cancer Daughter 68  . Cancer Daughter 20       vulvar cancer  . Other Daughter        BRIP1 gene mutation [id number]  . Hypertension Son   . Colon polyps Son 19  . Diabetes Maternal Aunt   . Lung cancer Paternal Aunt        d. >50  . Heart Problems Paternal Grandmother   . Diabetes Paternal Grandfather   . Cancer Paternal Aunt        unknown type, dx >50  . Cancer Paternal Great-grandmother        abdominal cancer NOS  . Breast cancer Other        paternal great-great aunt  (PGF's aunt)  . Cancer Cousin 17       gynecologic cancer NOS (paternal first cousin)  . Cancer Niece 59       gynecologic cancer NOS   . Esophageal cancer Neg Hx   . Stomach cancer Neg Hx   . Rectal cancer Neg Hx   . Colon cancer Neg Hx   . Prostate cancer Neg Hx   . Pancreatic cancer Neg Hx      SOCIAL HISTORY:  Social Connections: Not on file    REVIEW OF SYSTEMS:  + fatigue, depression, anxiety Denies appetite changes, fevers, chills, unexplained weight changes. Denies hearing loss, neck lumps or masses, mouth sores, ringing in ears or voice changes. Denies cough or wheezing.  Denies shortness of breath. Denies chest pain or palpitations. Denies leg swelling. Denies abdominal distention, pain, blood in stools, constipation, diarrhea, nausea, vomiting, or early satiety. Denies pain with intercourse, dysuria, frequency, hematuria or incontinence. Denies hot flashes, pelvic pain, vaginal bleeding or vaginal discharge.   Denies joint pain, back pain or muscle pain/cramps. Denies itching, rash, or wounds. Denies dizziness, headaches, numbness or seizures. Denies swollen lymph nodes or glands, denies easy bruising or bleeding. Denies confusion, or decreased concentration.  Physical Exam:  Vital Signs for this encounter:  Blood pressure (!) 147/49, pulse (!) 54, temperature (!) 96.8 F (36 C), temperature source Tympanic, resp. rate 18, weight 246 lb (111.6 kg), SpO2 97 %. Body mass index is 42.23 kg/m. General: Alert, oriented, no acute distress.  HEENT: Normocephalic, atraumatic. Sclera anicteric.  Chest: Clear to auscultation bilaterally. No wheezes, rhonchi, or rales. Cardiovascular: Regular rate and rhythm, no murmurs, rubs, or gallops.  Abdomen: Obese. Normoactive bowel sounds. Soft, nondistended, nontender to palpation. No masses or hepatosplenomegaly appreciated. No palpable fluid wave.  Extremities: Grossly normal range of motion. Warm, well perfused. No edema  bilaterally.  Skin: No rashes or lesions.  Lymphatics: No cervical, supraclavicular, or inguinal adenopathy.  GU:  Normal external female genitalia. No lesions. No discharge or bleeding.             Bladder/urethra:  No lesions or masses, well supported bladder             Vagina: Mildly atrophic vaginal mucosa, no lesions or masses seen.             Cervix/uterus: Surgically absent.             Adnexa: No masses appreciated.  Rectal: No  nodularity.  LABORATORY AND RADIOLOGIC DATA:  Outside medical records were reviewed to synthesize the above history, along with the history and physical obtained during the visit.   Lab Results  Component Value Date   WBC 10.9 (H) 11/16/2020   HGB 15.2 (H) 11/16/2020   HCT 46.0 11/16/2020   PLT 247 11/16/2020   GLUCOSE 187 (H) 11/16/2020   CHOL 143 04/01/2020   TRIG 180 (H) 04/01/2020   HDL 51 04/01/2020   LDLDIRECT 58 08/07/2019   LDLCALC 62 04/01/2020   ALT 35 11/16/2020   AST 27 11/16/2020   NA 136 11/16/2020   K 3.7 11/16/2020   CL 100 11/16/2020   CREATININE 0.90 11/16/2020   BUN 12 11/16/2020   CO2 23 11/16/2020   TSH 4.390 11/15/2017   INR 1.04 01/27/2010   HGBA1C 6.2 (A) 12/02/2020

## 2021-03-09 ENCOUNTER — Telehealth: Payer: Self-pay | Admitting: *Deleted

## 2021-03-09 NOTE — Telephone Encounter (Signed)
Patient called and stated "I know Melissa said yesterday I would have some limitations/restrictions for four weeks after surgery. I have an appt with the geriatric clinic to check my memory and some other things two days after surgery. Will I be able to drive to that appt? If not I need to move the surgery." Explained that Melissa APP is not in the office but the message would be giving to her.

## 2021-03-10 NOTE — Telephone Encounter (Signed)
Told Ms Rabadan that the surgery schedule  For July is not out yet.  Our office will call her back with dates for surgery when schedule is finalized. Pt agreeable to this plan.

## 2021-03-10 NOTE — Telephone Encounter (Signed)
Offered June 16 th for surgery as she would be able to drive to her appointment on ~April 28, 2021 if she is not taking the narcotic medication. Ms Forbess would like to r/s surgery to later then 04-26-21. Will call her back with available dates after speaking with [name], NP.

## 2021-03-19 ENCOUNTER — Other Ambulatory Visit: Payer: Self-pay | Admitting: Family Medicine

## 2021-03-19 DIAGNOSIS — N3946 Mixed incontinence: Secondary | ICD-10-CM

## 2021-03-22 ENCOUNTER — Ambulatory Visit
Admission: RE | Admit: 2021-03-22 | Discharge: 2021-03-22 | Disposition: A | Discharge status: Home / Self Care | Payer: PPO | Source: Ambulatory Visit | Attending: Family Medicine | Admitting: Family Medicine

## 2021-03-22 ENCOUNTER — Other Ambulatory Visit: Payer: Self-pay

## 2021-03-22 DIAGNOSIS — Z1231 Encounter for screening mammogram for malignant neoplasm of breast: Secondary | ICD-10-CM

## 2021-03-31 ENCOUNTER — Encounter: Payer: Self-pay | Admitting: Family Medicine

## 2021-03-31 DIAGNOSIS — M160 Bilateral primary osteoarthritis of hip: Secondary | ICD-10-CM

## 2021-03-31 DIAGNOSIS — G2581 Restless legs syndrome: Secondary | ICD-10-CM

## 2021-03-31 DIAGNOSIS — M48061 Spinal stenosis, lumbar region without neurogenic claudication: Secondary | ICD-10-CM

## 2021-03-31 DIAGNOSIS — G894 Chronic pain syndrome: Secondary | ICD-10-CM

## 2021-03-31 DIAGNOSIS — M47896 Other spondylosis, lumbar region: Secondary | ICD-10-CM

## 2021-04-01 ENCOUNTER — Encounter: Payer: Self-pay | Admitting: Gynecologic Oncology

## 2021-04-01 ENCOUNTER — Encounter: Payer: Self-pay | Admitting: Family Medicine

## 2021-04-01 ENCOUNTER — Other Ambulatory Visit: Payer: Self-pay | Admitting: Family Medicine

## 2021-04-01 ENCOUNTER — Telehealth: Payer: Self-pay

## 2021-04-01 DIAGNOSIS — E119 Type 2 diabetes mellitus without complications: Secondary | ICD-10-CM

## 2021-04-01 MED ORDER — MORPHINE SULFATE ER 15 MG PO TBCR: 15.0000 mg | EXTENDED_RELEASE_TABLET | Freq: Two times a day (BID) | ORAL | 0 refills | Status: DC

## 2021-04-01 MED ORDER — HYDROCODONE-ACETAMINOPHEN 10-325 MG PO TABS: 1.0000 | ORAL_TABLET | Freq: Four times a day (QID) | ORAL | 0 refills | Status: DC | PRN

## 2021-04-01 NOTE — Telephone Encounter (Signed)
Gave [name] the surgery dates of 05-10-21;05-19-21, and 05-26-21.  Pt chose 05-19-21.  Pt r/s for a pre-op appointment with Melissa Cross,NP on 05-10-21 at 2 pm. [name], NP given above information.

## 2021-04-07 ENCOUNTER — Ambulatory Visit: Payer: PPO | Admitting: Gynecologic Oncology

## 2021-04-11 NOTE — Progress Notes (Signed)
This encounter was created in error - please disregard.

## 2021-04-12 ENCOUNTER — Ambulatory Visit: Payer: PPO | Admitting: Licensed Clinical Social Worker

## 2021-04-12 DIAGNOSIS — Z0189 Encounter for other specified special examinations: Secondary | ICD-10-CM

## 2021-04-12 DIAGNOSIS — Z139 Encounter for screening, unspecified: Secondary | ICD-10-CM

## 2021-04-13 ENCOUNTER — Encounter: Payer: Self-pay | Admitting: Licensed Clinical Social Worker

## 2021-04-13 NOTE — Patient Instructions (Signed)
Licensed Clinical Social Worker Visit Information  Goals we discussed today:  Goals Addressed            This Visit's Progress   . Begin and Stick with Counseling-Depression       Timeframe:  Long-Range Goal Priority:  High Start Date:04/12/21                             Expected End Date:                       Follow Up Date 04/19/21    Patient Goals/Self-Care Activities: Over the next 90 days . - spend time or talk with others at least 2 to 3 times per week . Call Dr. Ready office to schedule your counseling appointment  . Continue with compliance of taking medication   Why is this important?    Beating depression may take some time.   If you don't feel better right away, don't give up on your treatment plan.     [name] Long-Term Care Planning       Patient Goals/Self-Care Activities : Over the next 14 days . Review educational information on 5 wishes and Advance Directive  . We will review information at next appointment /Complete Advance Directive packet,  . Have advance directive notarized and provide a copy to provider office       [name] was given information about Care Management services today including:  1. Care Management services include personalized support from designated clinical staff supervised by her physician, including individualized plan of care and coordination with other care providers 2. 24/7 contact phone numbers for assistance for urgent and routine care needs. 3. The patient may stop Care Management services at any time by phone call to the office staff.  Patient agreed to services and verbal consent obtained.  Patient verbalizes understanding of instructions provided today and agrees to view in [place].  Follow up plan: SW will follow up with patient by phone over the next 7 days  Casimer Lanius, [place] [facility]  [phone]

## 2021-04-13 NOTE — Chronic Care Management (AMB) (Signed)
Care Management Clinical Social Work  [facility]    04/13/2021 Name: [name] MRN: [id number] DOB: [birth year]  [name] is a 70 y.o. year old female who sees McDiarmid, Blane Ohara, MD for primary care.    Engaged with patient by telephone for initial visit in response to a referral from Dr. McDiarmid for social work care coordination services to assess needs, support and barriers to care prior to [place] clinic appointment  Consent to Services:   Ms. Vink was given information about Care Management services today including:  1. Care Management services includes personalized support from designated clinical staff supervised by her physician, including individualized plan of care and coordination with other care providers 2. 24/7 contact phone numbers for assistance for urgent and routine care needs. 3. The patient may stop case management services at any time by phone call to the office staff. Patient agreed to services and consent obtained.    (Look under patient's history for social information from assessment) Assessment:Patient is currently experiencing symptoms of  depression and lack of focus which seems to be exacerbated by memory difficutlies and paranoia. Currently being followed and treated by psychiatry. . See Care Plan below for interventions and patient self-care actives. Recent life changes /stressors: family history of cancer; reports spending most of her time in bed.   Recommendation: Patient may benefit from, and is in agreement to contact psychiatrist office to discuss counseling.  Admits to having concerns with paranoia with medications, going out in public and going to the doctors office.    Follow up Plan: Patient would like continued follow-up.  CCM LCSW will follow up with patient in one weeks to assist with Advance Directives.. Patient will call office if needed prior to next encounter.   Review of patient past medical history, allergies,  medications, and health status, including review of relevant consultants reports was performed today as part of a comprehensive evaluation and provision of chronic care management and care coordination services.  SDOH(Social Determinants of Health) assessments and interventions performed: No needs identified  Advanced Directives Status: See Care Plan for related entries.  Care Plan  Conditions to be addressed/monitored: Bipolar Disorder; Mental Health Concerns  and Memory Deficits  Care Plan : General Social Work (Adult)  Updates made by Maurine Cane, LCSW since 04/13/2021 12:00 AM  Problem: No Advance Directive   Goal: Effective Long-Term Care Planning/ Advance Directive   Start Date: 04/13/2021  This Visit's Progress: On track  Priority: High  Current barriers:   . Patient does not have an advance directive.  . Needs education, support and coordination in order to meet this need. Clinical Goal(s): Over the next 30 days, the patient will review information on advance directive, complete advance directive packet and have notarized.  Interventions: . Collaboration with PCP regarding development and update of comprehensive plan of care as evidenced by provider attestation and co-signature. Bertram Savin care team collaboration (see longitudinal plan of care) . Assessed understanding of Advance Directives. . A voluntary discussion about advanced care planning including importance of advanced directives, healthcare proxy and living will was discussed with the patient.  . Educational information on Advance Directives as well as an advance directive packet provided.  Patient Goals/Self-Care Activities : Over the next 14 days . Review educational information on 5 wishes and Advance Directive  . We will review information at next appointment /Complete Advance Directive packet,  . Have advance directive notarized and provide a copy to provider office  Problem: Emotional Distress    Long-Range Goal: Emotional Health Managed   Start Date: 04/13/2021  This Visit's Progress: On track  Priority: High  Current barriers:   . Chronic Mental Health needs related to Bipolar II . Mental Health Concerns , Social Isolation, and Memory Deficits  Needs Support in order to meet unmet mental health needs. Clinical Goal(s): patient will continue current treatment with psychiatrist to manage symptoms of mood instability and increase knowledge and/or ability of: coping skills and self-management skills. Clinical Interventions:  . Assessed patient's previous treatment, needs, coping skills, current treatment, support system and barriers to care  . Patient connect with Psychiatry; they have been following her sin 35 . Other interventions: Active listening / Reflection utilized , Emotional Supportive Provided, and Participation in counseling encouraged  ; . Discussed several options for long term counseling based on need and insurance. ( patient will discuss with her psychiatrist when she talks with him . Collaboration with PCP regarding development and update of comprehensive plan of care as evidenced by provider attestation and co-signature . Inter-disciplinary care team collaboration (see longitudinal plan of care) Patient Goals/Self-Care Activities: Over the next 90 days . - spend time or talk with others at least 2 to 3 times per week . Call Dr. Ready office to schedule your counseling appointment  . Continue with compliance of taking medication      Allergies  Allergen Reactions  . Diclofenac-Misoprostol Shortness Of Breath    elevated blood pressure  . Aripiprazole     Doesn't want to take because of possible side effects  . Emsam [Selegiline] Other (See Comments)    Burned the skin after using for a year  . Estradiol Swelling  . Ibuprofen Hives  . Naproxen Nausea Only  . Neurontin [Gabapentin] Hives and Swelling  . Paroxetine Other (See Comments)    Panic attacks  .  Prednisone Other (See Comments)    Severe depression  . Tape Other (See Comments)    Paper tape caused blisters, plastic tape ok   Outpatient Encounter Medications as of 04/12/2021  Medication Sig  . atorvastatin (LIPITOR) 20 MG tablet TAKE 1 TABLET BY MOUTH EVERY DAY  . Calcium Carb-Cholecalciferol 1000-800 MG-UNIT TABS Take by mouth.  . citalopram (CELEXA) 40 MG tablet Take 0.5 tablets (20 mg total) by mouth at bedtime. (Patient taking differently: Take 40 mg by mouth daily.)  . Continuous Blood Gluc Sensor (FREESTYLE LIBRE 14 DAY SENSOR) MISC APPLY EVERY 14 DAYS  . darifenacin (ENABLEX) 7.5 MG 24 hr tablet TAKE 1 TABLET BY MOUTH EVERY DAY  . dicyclomine (BENTYL) 20 MG tablet Take 20 mg by mouth every 6 (six) hours.  . fexofenadine (ALLEGRA) 180 MG tablet Take 180 mg by mouth daily.  . fluticasone (FLONASE) 50 MCG/ACT nasal spray Place 2 sprays into both nostrils daily.  . hydrochlorothiazide (HYDRODIURIL) 25 MG tablet TAKE 1 TABLET BY MOUTH EVERY DAY  . HYDROcodone-acetaminophen (NORCO) 10-325 MG tablet Take 1 tablet by mouth every 6 (six) hours as needed.  . hydrOXYzine (VISTARIL) 50 MG capsule Take 150 mg by mouth at bedtime.   Marland Kitchen ketoconazole (NIZORAL) 2 % cream APPLY TOPICALLY TO AFFECTED AREA EVERY DAY  . KLOR-CON M10 10 MEQ tablet TAKE 4 TABLETS (40 MEQ TOTAL) BY MOUTH AT BEDTIME.  . Lactobacillus-Inulin (CULTURELLE DIGESTIVE DAILY PO) Take by mouth.  . levothyroxine (SYNTHROID) 125 MCG tablet TAKE 1 TABLET (125 MCG TOTAL) BY MOUTH EVERY MORNING. [place]  . loperamide (IMODIUM A-D) 2  MG capsule Take 1 capsule (2 mg total) by mouth as needed for diarrhea or loose stools.  Marland Kitchen LORazepam (ATIVAN) 1 MG tablet Take 1 mg by mouth 3 (three) times daily.  . metoprolol tartrate (LOPRESSOR) 50 MG tablet TAKE 1 TABLET BY MOUTH TWICE A DAY  . morphine (MS CONTIN) 15 MG 12 hr tablet Take 1 tablet (15 mg total) by mouth every 12 (twelve) hours.  . Multiple Minerals-Vitamins  (CALCIUM-MAGNESIUM-ZINC-D3) TABS Take 3 tablets by mouth daily.  Marland Kitchen omeprazole (PRILOSEC) 40 MG capsule TAKE 1 CAPSULE BY MOUTH EVERY DAY  . polyethylene glycol powder (GLYCOLAX/MIRALAX) 17 GM/SCOOP powder Take 17 g by mouth as needed for mild constipation.  . prazosin (MINIPRESS) 2 MG capsule Take 2 mg by mouth at bedtime.  . prochlorperazine (COMPAZINE) 10 MG tablet TAKE ONE TABLET BY MOUTH TWICE A DAY AS NEEDED FOR NAUSEA OR VOMITING  . ramipril (ALTACE) 10 MG capsule TAKE 1 CAPSULE BY MOUTH EVERY DAY  . Simethicone (GAS-X PO) Take 2 tablets by mouth daily as needed (gas).  . traZODone (DESYREL) 100 MG tablet Take 3 tablets (300 mg total) by mouth at bedtime.  . vitamin B-12 (CYANOCOBALAMIN) 1000 MCG tablet Take 1,000 mcg by mouth daily.   No facility-administered encounter medications on file as of 04/12/2021.   Patient Active Problem List   Diagnosis Date Noted  . Memory difficulties 03/04/2021  . Insomnia 03/03/2021  . Monoallelic mutation of BRIP1 gene 01/17/2021  . Arteriosclerosis of abdominal aorta ([place]) 12/02/2020  . Urinary incontinence, mixed 04/02/2020  . Diabetes mellitus type 2 in obese ([place]) 06/30/2019  . Type 2 diabetes mellitus without complications ([place]) 41/28/2081  . Spinal stenosis of lumbar region 06/21/2016  . Encounter for chronic pain management 12/03/2014  . Metabolic syndrome 38/87/1959  . Hepatic steatosis 04/12/2011    Class: Chronic  . Periodic limb movements of sleep 12/05/2010  . Vitamin D deficiency 03/03/2010  . HYPERCHOLESTEROLEMIA 12/24/2008  . Chronic pain syndrome 12/24/2008  . Functional gastrointestinal disorder 06/18/2008  . SIGMOID POLYP 01/23/2008  . HYPERTRIGLYCERIDEMIA 11/11/2007  . Obesity, Class III, BMI 40-49.9 (morbid obesity) ([place]) 09/30/2007  . RESTLESS LEGS SYNDROME 09/11/2007  . Fibromyalgia syndrome 07/15/2007  . Degenerative joint disease of both hips 05/23/2007  . Hypothyroidism 02/15/2007  . Bipolar 2 disorder ([place])  01/03/2007  . Hypertension associated with diabetes ([place]) 01/03/2007  . Allergic rhinitis 01/03/2007  . GERD without esophagitis 01/03/2007  . Irritable bowel syndrome 01/03/2007  . Osteoarthritis of spine 01/03/2007     Casimer Lanius, [place] / [place]   [phone] 8:39 AM

## 2021-04-17 ENCOUNTER — Other Ambulatory Visit: Payer: Self-pay

## 2021-04-19 ENCOUNTER — Ambulatory Visit: Payer: PPO | Admitting: Licensed Clinical Social Worker

## 2021-04-19 DIAGNOSIS — Z7189 Other specified counseling: Secondary | ICD-10-CM

## 2021-04-19 NOTE — Patient Instructions (Signed)
Visit Information   Goals Addressed             This Visit's Progress    Effective Long-Term Care Planning   On track    Patient Goals/Self-Care Activities : Over the next 14 days We will review information at next appointment /Complete Advance Directive packet,  Have advance directive notarized and provide a copy to provider office      Patient verbalizes understanding of instructions provided today and agrees to view in [place].   Telephone follow up appointment with care management team member scheduled for:04/22/2021  [name], [place] [facility]  [phone]

## 2021-04-19 NOTE — Chronic Care Management (AMB) (Signed)
Care Management   Clinical Social Work Note  04/19/2021 Name: [name] MRN: [id number] DOB: Jun 23, 1951  [name] is a 70 y.o. year old female who is a primary care patient of McDiarmid, Blane Ohara, MD. The CCM team was consulted to assist the patient with chronic disease management and/or care coordination needs related to: [place] representative.   Engaged with patient by telephone for follow up visit in response to provider referral for social work chronic care management and care coordination services.   Consent to Services: Patient agreed to services and consent obtained.   Assessment: Patient is engaged in conversation, continues to maintain positive progress with care plan goals. She is unable to complete appointment today and would like to  continue at a later date to address concerns as well as complete advance directive. See Care Plan below for interventions and patient self-care actives.  Follow up Plan: Patient would like continued follow-up.  CCM LCSW will follow up with patient 04/22/2021. Patient will call office if needed prior to next encounter.   : Review of patient past medical history, allergies, medications, and health status, including review of relevant consultants reports was performed today as part of a comprehensive evaluation and provision of chronic care management and care coordination services.     SDOH (Social Determinants of Health) assessments and interventions performed:    Advanced Directives Status: See Care Plan for related entries.  CCM Care Plan  Allergies  Allergen Reactions   Diclofenac-Misoprostol Shortness Of Breath    elevated blood pressure   Aripiprazole     Doesn't want to take because of possible side effects   Emsam [Selegiline] Other (See Comments)    Burned the skin after using for a year   Estradiol Swelling   Ibuprofen Hives   Naproxen Nausea Only   Neurontin [Gabapentin] Hives and Swelling   Paroxetine Other (See  Comments)    Panic attacks   Prednisone Other (See Comments)    Severe depression   Tape Other (See Comments)    Paper tape caused blisters, plastic tape ok    Outpatient Encounter Medications as of 04/19/2021  Medication Sig   atorvastatin (LIPITOR) 20 MG tablet TAKE 1 TABLET BY MOUTH EVERY DAY   Calcium Carb-Cholecalciferol 1000-800 MG-UNIT TABS Take by mouth.   citalopram (CELEXA) 40 MG tablet Take 0.5 tablets (20 mg total) by mouth at bedtime. (Patient taking differently: Take 40 mg by mouth daily.)   Continuous Blood Gluc Sensor (FREESTYLE LIBRE 14 DAY SENSOR) MISC APPLY EVERY 14 DAYS   darifenacin (ENABLEX) 7.5 MG 24 hr tablet TAKE 1 TABLET BY MOUTH EVERY DAY   dicyclomine (BENTYL) 20 MG tablet Take 20 mg by mouth every 6 (six) hours.   fexofenadine (ALLEGRA) 180 MG tablet Take 180 mg by mouth daily.   fluticasone (FLONASE) 50 MCG/ACT nasal spray Place 2 sprays into both nostrils daily.   hydrochlorothiazide (HYDRODIURIL) 25 MG tablet TAKE 1 TABLET BY MOUTH EVERY DAY   HYDROcodone-acetaminophen (NORCO) 10-325 MG tablet Take 1 tablet by mouth every 6 (six) hours as needed.   hydrOXYzine (VISTARIL) 50 MG capsule Take 150 mg by mouth at bedtime.    ketoconazole (NIZORAL) 2 % cream APPLY TOPICALLY TO AFFECTED AREA EVERY DAY   KLOR-CON M10 10 MEQ tablet TAKE 4 TABLETS (40 MEQ TOTAL) BY MOUTH AT BEDTIME.   Lactobacillus-Inulin (CULTURELLE DIGESTIVE DAILY PO) Take by mouth.   levothyroxine (SYNTHROID) 125 MCG tablet TAKE 1 TABLET (125 MCG TOTAL) BY MOUTH  EVERY MORNING. 30 MINUTES BEFORE FOOD   loperamide (IMODIUM A-D) 2 MG capsule Take 1 capsule (2 mg total) by mouth as needed for diarrhea or loose stools.   LORazepam (ATIVAN) 1 MG tablet Take 1 mg by mouth 3 (three) times daily.   metoprolol tartrate (LOPRESSOR) 50 MG tablet TAKE 1 TABLET BY MOUTH TWICE A DAY   morphine (MS CONTIN) 15 MG 12 hr tablet Take 1 tablet (15 mg total) by mouth every 12 (twelve) hours.   Multiple  Minerals-Vitamins (CALCIUM-MAGNESIUM-ZINC-D3) TABS Take 3 tablets by mouth daily.   omeprazole (PRILOSEC) 40 MG capsule TAKE 1 CAPSULE BY MOUTH EVERY DAY   polyethylene glycol powder (GLYCOLAX/MIRALAX) 17 GM/SCOOP powder Take 17 g by mouth as needed for mild constipation.   prazosin (MINIPRESS) 2 MG capsule Take 2 mg by mouth at bedtime.   prochlorperazine (COMPAZINE) 10 MG tablet TAKE ONE TABLET BY MOUTH TWICE A DAY AS NEEDED FOR NAUSEA OR VOMITING   ramipril (ALTACE) 10 MG capsule TAKE 1 CAPSULE BY MOUTH EVERY DAY   Simethicone (GAS-X PO) Take 2 tablets by mouth daily as needed (gas).   traZODone (DESYREL) 100 MG tablet Take 3 tablets (300 mg total) by mouth at bedtime.   vitamin B-12 (CYANOCOBALAMIN) 1000 MCG tablet Take 1,000 mcg by mouth daily.   No facility-administered encounter medications on file as of 04/19/2021.    Patient Active Problem List   Diagnosis Date Noted   Memory difficulties 03/04/2021   Insomnia 93/26/7124   Monoallelic mutation of BRIP1 gene 01/17/2021   Arteriosclerosis of abdominal aorta ([place]) 12/02/2020   Urinary incontinence, mixed 04/02/2020   Diabetes mellitus type 2 in obese ([place]) 06/30/2019   Type 2 diabetes mellitus without complications ([place]) 58/07/9832   Spinal stenosis of lumbar region 06/21/2016   Encounter for chronic pain management 82/50/5397   Metabolic syndrome 67/34/1937   Hepatic steatosis 04/12/2011    Class: Chronic   Periodic limb movements of sleep 12/05/2010   Vitamin D deficiency 03/03/2010   HYPERCHOLESTEROLEMIA 12/24/2008   Chronic pain syndrome 12/24/2008   Functional gastrointestinal disorder 06/18/2008   SIGMOID POLYP 01/23/2008   HYPERTRIGLYCERIDEMIA 11/11/2007   Obesity, Class III, BMI 40-49.9 (morbid obesity) ([place]) 09/30/2007   RESTLESS LEGS SYNDROME 09/11/2007   Fibromyalgia syndrome 07/15/2007   Degenerative joint disease of both hips 05/23/2007   Hypothyroidism 02/15/2007   Bipolar 2 disorder ([place]) 01/03/2007    Hypertension associated with diabetes ([place]) 01/03/2007   Allergic rhinitis 01/03/2007   GERD without esophagitis 01/03/2007   Irritable bowel syndrome 01/03/2007   Osteoarthritis of spine 01/03/2007    Conditions to be addressed/monitored:  Advance care [place] : General Social Work (Adult)  Updates made by Maurine Cane, LCSW since 04/19/2021 12:00 AM   Problem: No Advance Directive    Goal: Effective Long-Term Care Planning/ Advance Directive   Start Date: 04/13/2021  This Visit's Progress: On track  Recent Progress: On track  Priority: High  Current barriers:   Patient does not have an advance directive.  Needs education, support and coordination in order to meet this need. Clinical Goal(s): Over the next 30 days, the patient will review information on advance directive, complete advance directive packet and have notarized.  Interventions: Collaboration with PCP regarding development and update of comprehensive plan of care as evidenced by provider attestation and co-signature. Inter-disciplinary care team collaboration (see longitudinal plan of care) Assessed understanding of Advance Directives. Patient has reviewed the EMMI video and thought of who she would  like to serve as her healthcare proxy  Currently has some concern with discussing end of life and advance care planning. A voluntary discussion about advanced care planning including importance of advanced directives, healthcare proxy and living will was discussed with the patient.  Educational information on Advance Directives as well as an advance directive packet provided.  Patient Goals/Self-Care Activities : Over the next 14 days We will review information at next appointment /Complete Advance Directive packet,  Have advance directive notarized and provide a copy to provider office     Casimer Lanius, [place] / [place]    [phone] 3:18 PM

## 2021-04-22 ENCOUNTER — Ambulatory Visit: Payer: PPO | Admitting: Licensed Clinical Social Worker

## 2021-04-22 DIAGNOSIS — Z7189 Other specified counseling: Secondary | ICD-10-CM

## 2021-04-22 NOTE — Chronic Care Management (AMB) (Signed)
    Clinical Social Work  Care Management   Phone Outreach    04/22/2021 Name: [name] MRN: [id number] DOB: Jul 07, 1951  [name] is a 70 y.o. year old female who is a primary care patient of McDiarmid, Blane Ohara, MD .   F/U phone call today to assess needs, and assist patient with advance directive care plan goals.   Patient unable to keep phone appointment today and requested to reschedule.  Plan:Appointment was rescheduled with CCM LCSW 04/27/2021 at 3:00  Review of patient status, including review of consultants reports, relevant laboratory and other test results, and collaboration with appropriate care team members and the patient's provider was performed as part of comprehensive patient evaluation and provision of care management services.     Casimer Lanius, [place] / [place]   [phone] 3:10 PM

## 2021-04-22 NOTE — Patient Instructions (Signed)
Visit Information  Telephone follow up appointment with care management team member scheduled for:04/27/2021  Kylo Gavin, [place] [facility]  [phone]

## 2021-04-27 ENCOUNTER — Ambulatory Visit: Payer: PPO | Admitting: Licensed Clinical Social Worker

## 2021-04-27 DIAGNOSIS — Z7189 Other specified counseling: Secondary | ICD-10-CM

## 2021-04-27 NOTE — Chronic Care Management (AMB) (Signed)
Care Management   Clinical Social Work Note  04/27/2021 Name: Liann Spaeth MRN: [id number] DOB: [birth year]  Jaspreet Bodner Joswick is a 70 y.o. year old female who is a primary care patient of McDiarmid, Blane Ohara, MD. The CCM team was consulted to assist the patient with chronic disease management and/or care coordination needs related to: [employer] representative.   Engaged with patient by telephone for follow up visit in response to provider referral for social work chronic care management and care coordination services.   Consent to Services: Patient agreed to services and consent obtained.   Assessment: Patient is making progress with and has completed her advance directive. Copy left at front desk she will have it notarized tomorrow prior to her appointment. This has been a big stressor for her and she is glad to have completed it.   See Care Plan below for interventions and patient self-care actives.  Recommendation: Patient may benefit from, and is in agreement to follow up with psychiatry for counseling.   Follow up Plan: No follow up scheduled with CCM team at this time. Will follow up with patient in 14 to 21 days after [facility] clinic appointment .   Review of patient past medical history, allergies, medications, and health status, including review of relevant consultants reports was performed today as part of a comprehensive evaluation and provision of chronic care management and care coordination services.     SDOH (Social Determinants of Health) assessments and interventions performed:    Advanced Directives Status: See Care Plan for related entries.  CCM Care Plan  Allergies  Allergen Reactions   Diclofenac-Misoprostol Shortness Of Breath    elevated blood pressure   Aripiprazole     Doesn't want to take because of possible side effects   Emsam [Selegiline] Other (See Comments)    Burned the skin after using for a year   Estradiol Swelling   Ibuprofen Hives   Naproxen  Nausea Only   Neurontin [Gabapentin] Hives and Swelling   Paroxetine Other (See Comments)    Panic attacks   Prednisone Other (See Comments)    Severe depression   Tape Other (See Comments)    Paper tape caused blisters, plastic tape ok    Outpatient Encounter Medications as of 04/27/2021  Medication Sig   atorvastatin (LIPITOR) 20 MG tablet TAKE 1 TABLET BY MOUTH EVERY DAY   Calcium Carb-Cholecalciferol 1000-800 MG-UNIT TABS Take by mouth.   citalopram (CELEXA) 40 MG tablet Take 0.5 tablets (20 mg total) by mouth at bedtime. (Patient taking differently: Take 40 mg by mouth daily.)   Continuous Blood Gluc Sensor (FREESTYLE LIBRE 14 DAY SENSOR) MISC APPLY EVERY 14 DAYS   darifenacin (ENABLEX) 7.5 MG 24 hr tablet TAKE 1 TABLET BY MOUTH EVERY DAY   dicyclomine (BENTYL) 20 MG tablet Take 20 mg by mouth every 6 (six) hours.   fexofenadine (ALLEGRA) 180 MG tablet Take 180 mg by mouth daily.   fluticasone (FLONASE) 50 MCG/ACT nasal spray Place 2 sprays into both nostrils daily.   hydrochlorothiazide (HYDRODIURIL) 25 MG tablet TAKE 1 TABLET BY MOUTH EVERY DAY   HYDROcodone-acetaminophen (NORCO) 10-325 MG tablet Take 1 tablet by mouth every 6 (six) hours as needed.   hydrOXYzine (VISTARIL) 50 MG capsule Take 150 mg by mouth at bedtime.    ketoconazole (NIZORAL) 2 % cream APPLY TOPICALLY TO AFFECTED AREA EVERY DAY   KLOR-CON M10 10 MEQ tablet TAKE 4 TABLETS (40 MEQ TOTAL) BY MOUTH AT BEDTIME.  Lactobacillus-Inulin (CULTURELLE DIGESTIVE DAILY PO) Take by mouth.   levothyroxine (SYNTHROID) 125 MCG tablet TAKE 1 TABLET (125 MCG TOTAL) BY MOUTH EVERY MORNING. 30 MINUTES BEFORE FOOD   loperamide (IMODIUM A-D) 2 MG capsule Take 1 capsule (2 mg total) by mouth as needed for diarrhea or loose stools.   LORazepam (ATIVAN) 1 MG tablet Take 1 mg by mouth 3 (three) times daily.   metoprolol tartrate (LOPRESSOR) 50 MG tablet TAKE 1 TABLET BY MOUTH TWICE A DAY   morphine (MS CONTIN) 15 MG 12 hr tablet Take 1  tablet (15 mg total) by mouth every 12 (twelve) hours.   Multiple Minerals-Vitamins (CALCIUM-MAGNESIUM-ZINC-D3) TABS Take 3 tablets by mouth daily.   omeprazole (PRILOSEC) 40 MG capsule TAKE 1 CAPSULE BY MOUTH EVERY DAY   polyethylene glycol powder (GLYCOLAX/MIRALAX) 17 GM/SCOOP powder Take 17 g by mouth as needed for mild constipation.   prazosin (MINIPRESS) 2 MG capsule Take 2 mg by mouth at bedtime.   prochlorperazine (COMPAZINE) 10 MG tablet TAKE ONE TABLET BY MOUTH TWICE A DAY AS NEEDED FOR NAUSEA OR VOMITING   ramipril (ALTACE) 10 MG capsule TAKE 1 CAPSULE BY MOUTH EVERY DAY   Simethicone (GAS-X PO) Take 2 tablets by mouth daily as needed (gas).   traZODone (DESYREL) 100 MG tablet Take 3 tablets (300 mg total) by mouth at bedtime.   vitamin B-12 (CYANOCOBALAMIN) 1000 MCG tablet Take 1,000 mcg by mouth daily.   No facility-administered encounter medications on file as of 04/27/2021.    Patient Active Problem List   Diagnosis Date Noted   Memory difficulties 03/04/2021   Insomnia 92/09/9416   Monoallelic mutation of BRIP1 gene 01/17/2021   Arteriosclerosis of abdominal aorta ([place]) 12/02/2020   Urinary incontinence, mixed 04/02/2020   Diabetes mellitus type 2 in obese ([place]) 06/30/2019   Type 2 diabetes mellitus without complications ([place]) 40/81/4481   Spinal stenosis of lumbar region 06/21/2016   Encounter for chronic pain management 85/63/1497   Metabolic syndrome 02/63/7858   Hepatic steatosis 04/12/2011    Class: Chronic   Periodic limb movements of sleep 12/05/2010   Vitamin D deficiency 03/03/2010   HYPERCHOLESTEROLEMIA 12/24/2008   Chronic pain syndrome 12/24/2008   Functional gastrointestinal disorder 06/18/2008   SIGMOID POLYP 01/23/2008   HYPERTRIGLYCERIDEMIA 11/11/2007   Obesity, Class III, BMI 40-49.9 (morbid obesity) ([place]) 09/30/2007   RESTLESS LEGS SYNDROME 09/11/2007   Fibromyalgia syndrome 07/15/2007   Degenerative joint disease of both hips 05/23/2007    Hypothyroidism 02/15/2007   Bipolar 2 disorder ([place]) 01/03/2007   Hypertension associated with diabetes ([place]) 01/03/2007   Allergic rhinitis 01/03/2007   GERD without esophagitis 01/03/2007   Irritable bowel syndrome 01/03/2007   Osteoarthritis of spine 01/03/2007    Conditions to be addressed/monitored: Bipolar Disorder;  Care Plan : General Social Work (Adult)  Updates made by Maurine Cane, LCSW since 04/27/2021 12:00 AM   Problem: No Advance Directive    Goal: Effective Long-Term Care Planning/ Advance Directive   Start Date: 04/13/2021  Expected End Date: 05/05/2021  This Visit's Progress: On track  Recent Progress: On track  Priority: High  Current barriers:   Patient does not have an advance directive.  Needs education, support and coordination in order to meet this need. Clinical Goal(s): Over the next 30 days, the patient will review information on advance directive, complete advance directive packet and have notarized.  Interventions: Inter-disciplinary care team collaboration (see longitudinal plan of care) Assessed understanding of Advance Directives. Assisted patient with completing document  Patient has reviewed the EMMI video and thought of who she would like to serve as her healthcare proxy  Currently has some concern with discussing end of life and advance care planning. A voluntary discussion about advanced care planning including importance of advanced directives, healthcare proxy and living will was discussed with the patient.  Educational information on Advance Directives as well as an advance directive packet provided.  Patient Goals/Self-Care Activities : Over the next 14 days Congratulations on completing your advance directive Have advance directive notarized and provide a copy to provider office     Casimer Lanius, [place] / [place]   [phone] 3:43 PM

## 2021-04-27 NOTE — Patient Instructions (Signed)
Visit Information   Goals Addressed             This Visit's Progress    Begin and Stick with Counseling-Depression       Timeframe:  Long-Range Goal Priority:  High Start Date:04/12/21                             Expected End Date:                       Patient Goals/Self-Care Activities: Over the next 90 days - spend time or talk with others at least 2 to 3 times per week Call Dr. Ready office to schedule your counseling appointment  Continue with compliance of taking medication   Why is this important?   Beating depression may take some time.  If you don't feel better right away, don't give up on your treatment plan.       Effective Long-Term Care Planning   On track    Patient Goals/Self-Care Activities : Over the next 14 days Congratulations on completing your advance directive  Have advance directive notarized and provide a copy to provider office      Patient verbalizes understanding of instructions provided today and agrees to view in [place].   The care management team will reach out to the patient again over the next 21 days.   Casimer Lanius, LCSW Care Management & Coordination  [phone]

## 2021-04-28 ENCOUNTER — Other Ambulatory Visit: Payer: Self-pay

## 2021-04-28 ENCOUNTER — Ambulatory Visit (INDEPENDENT_AMBULATORY_CARE_PROVIDER_SITE_OTHER): Payer: PPO | Admitting: Family Medicine

## 2021-04-28 VITALS — BP 150/70 | HR 62 | Ht 64.0 in | Wt 248.4 lb

## 2021-04-28 DIAGNOSIS — Z79899 Other long term (current) drug therapy: Secondary | ICD-10-CM | POA: Diagnosis not present

## 2021-04-28 DIAGNOSIS — R413 Other amnesia: Secondary | ICD-10-CM

## 2021-04-28 DIAGNOSIS — Z789 Other specified health status: Secondary | ICD-10-CM | POA: Diagnosis not present

## 2021-04-28 DIAGNOSIS — H9193 Unspecified hearing loss, bilateral: Secondary | ICD-10-CM | POA: Diagnosis not present

## 2021-04-28 DIAGNOSIS — IMO0001 Reserved for inherently not codable concepts without codable children: Secondary | ICD-10-CM | POA: Insufficient documentation

## 2021-04-28 DIAGNOSIS — H9190 Unspecified hearing loss, unspecified ear: Secondary | ICD-10-CM

## 2021-04-28 HISTORY — DX: Reserved for inherently not codable concepts without codable children: IMO0001

## 2021-04-28 HISTORY — DX: Unspecified hearing loss, unspecified ear: H91.90

## 2021-04-28 NOTE — Progress Notes (Addendum)
COVID Vaccine Completed:  Yes x4 Date COVID Vaccine completed: 12-23-19, 01-13-20 Has received booster:  08-26-20, 02-09-21 COVID vaccine manufacturer: [employer]      Date of COVID positive in last 90 days: N/A  PCP - Sherren Mocha McDiarmid, MD Cardiologist - Last saw cardiology 2011. Dr. Candelaria Celeste  Chest x-ray - N/A EKG - 05-10-21 Epic Stress Test - 10 years ago ECHO - N/A Cardiac Cath - 10 years ago Pacemaker/ICD device last checked: Spinal Cord Stimulator:  Sleep Study - Yes, neg sleep apnea CPAP - No  Fasting Blood Sugar - 69 to 250 Checks Blood Sugar - 4 times a day  Blood Thinner Instructions:  N/A Aspirin Instructions: Last Dose:  Activity level:  Unable to go up a flight of stairs without symptoms of shortness of breath and joint pain.  Patient states that when doing housework she does have to stop and rest because she will get winded.  This has been her baseline for years due to COPD.  Denies chest pain with activity.       Anesthesia review:  COPD,  emphysema, CAD, abnormal EKG in the past, evaluated by cardiology  Patient denies shortness of breath, fever, cough and chest pain at PAT appointment   Patient verbalized understanding of instructions that were given to them at the PAT appointment. Patient was also instructed that they will need to review over the PAT instructions again at home before surgery.

## 2021-04-28 NOTE — Progress Notes (Signed)
Provider:  Lissa Morales, MD Location:      Place of Service:     PCP: McDiarmid, Blane Ohara, MD Patient Care Team: McDiarmid, Blane Ohara, MD as PCP - General Care, [facility] as Consulting Physician (Pain Medicine) Susa Day, MD as Consulting Physician (Orthopedic Surgery) Kennith Center, RD as Dietitian (Family Medicine) Michael Boston, MD as Consulting Physician (General Surgery) Maurine Cane, LCSW as Social Worker (Licensed Clinical Social Worker)  Extended Emergency Contact Information Primary Emergency Contact: Juleen China, [place] [address] [address] [place] Phone: [phone] Relation: Son Secondary Emergency Contact: Allen,Lisa Address: [address]          [place], [place] [address] [place] Phone: [phone] Relation: Daughter  Code Status: DNR Goals of Care: Advanced Directive information Advanced Directives 04/28/2021  Does Patient Have a Medical Advance Directive? Yes  Type of Advance Directive Living will  Does patient want to make changes to medical advance directive? No - Patient declined  Would patient like information on creating a medical advance directive? No - Patient declined     [facility]:   Patient is alone Primary caregiver:  patient Patient's Currently living arrangement:  daughter Patient information was obtained from ER records, historical medical records, and office notes  patient. History/Exam limitations:  none Primary Care Provider:   McDiarmid, Blane Ohara, MD Referring provider:  McDiarmid, Blane Ohara, MD Reason for referral:  Memory Concerns   ----------------------------------------------------------------------------------------------------------------------------------------------------------------------------------------------------------------------------------------------------------------   HPI by problems:  Chief Complaint   Patient presents with   Memory Loss    Cognitive impairment concern  Are there problems with thinking?  Cognition domains: Memory difficulties, Task performance difficulties, and Attention difficulties   When were the changes first noticed?  Over the past year   Did this change occur abruptly or gradually?  Gradual over the last six months   How have the changes progressed since then?  gradually worsening over the last six months   Has there been any tremors or abnormal movements?  yes, sometimes,hand will jerk once in a while   Have they had in hallucinations or delusions:  no  Have they appeared more anxious or sad lately?  yes  Do they still have interests or activities they enjor doing?  no  How has their appetite been lately?  show no change  How has their sleep been lately?  Chronically trouble falling asleep   Compared to 5 to 10 years ago, how is the patient at:  Problems with Judgment, e.g., problem making decisions, bad financial decisions, problems with thinking?  no  Less interested in hobbies or previously enjoyed activities?  yes  Problem remembering things about family and friends e.g. names,  occupations, birthdays, addresses?  no  Problem remembering conversations or news events a few days later?  no  Problem remembering what day and month it is? no  Problem with losing things?  no  Problem learning to use a new gadget or machine around the house, e.g., cell phones, computer, microwave, remote control?  no  Problem with handling money for shopping?  no  Problem handling financial matters, e.g. their pension, checking, credit cards, dealing with the bank?  No, has a specific budget list and manages all of her own finances   Problem with getting lost in familiar places?  no   Geriatric Depression Scale:  12 / 15   Outpatient Encounter  Medications as of 04/28/2021  Medication Sig   atorvastatin (LIPITOR) 20 MG tablet TAKE 1  TABLET BY MOUTH EVERY DAY   Calcium Carb-Cholecalciferol 1000-800 MG-UNIT TABS Take by mouth.   citalopram (CELEXA) 40 MG tablet Take 0.5 tablets (20 mg total) by mouth at bedtime. (Patient taking differently: No sig reported)   Continuous Blood Gluc Sensor (FREESTYLE LIBRE 14 DAY SENSOR) MISC APPLY EVERY 14 DAYS   darifenacin (ENABLEX) 7.5 MG 24 hr tablet TAKE 1 TABLET BY MOUTH EVERY DAY   dicyclomine (BENTYL) 20 MG tablet Take 20 mg by mouth every 6 (six) hours.   fexofenadine (ALLEGRA) 180 MG tablet Take 180 mg by mouth daily.   fluticasone (FLONASE) 50 MCG/ACT nasal spray Place 2 sprays into both nostrils daily.   hydrochlorothiazide (HYDRODIURIL) 25 MG tablet TAKE 1 TABLET BY MOUTH EVERY DAY   HYDROcodone-acetaminophen (NORCO) 10-325 MG tablet Take 1 tablet by mouth every 6 (six) hours as needed.   hydrOXYzine (VISTARIL) 50 MG capsule Take 150 mg by mouth at bedtime.    ketoconazole (NIZORAL) 2 % cream APPLY TOPICALLY TO AFFECTED AREA EVERY DAY   KLOR-CON M10 10 MEQ tablet TAKE 4 TABLETS (40 MEQ TOTAL) BY MOUTH AT BEDTIME.   Lactobacillus-Inulin (CULTURELLE DIGESTIVE DAILY PO) Take by mouth.   levothyroxine (SYNTHROID) 125 MCG tablet TAKE 1 TABLET (125 MCG TOTAL) BY MOUTH EVERY MORNING. 30 MINUTES BEFORE FOOD   loperamide (IMODIUM A-D) 2 MG capsule Take 1 capsule (2 mg total) by mouth as needed for diarrhea or loose stools.   LORazepam (ATIVAN) 1 MG tablet Take 1 mg by mouth 3 (three) times daily.   metoprolol tartrate (LOPRESSOR) 50 MG tablet TAKE 1 TABLET BY MOUTH TWICE A DAY   morphine (MS CONTIN) 15 MG 12 hr tablet Take 1 tablet (15 mg total) by mouth every 12 (twelve) hours.   Multiple Minerals-Vitamins (CALCIUM-MAGNESIUM-ZINC-D3) TABS Take 3 tablets by mouth daily.   omeprazole (PRILOSEC) 40 MG capsule TAKE 1 CAPSULE BY MOUTH EVERY DAY   polyethylene glycol powder (GLYCOLAX/MIRALAX) 17 GM/SCOOP powder Take 17 g by mouth as needed for mild constipation.   prazosin (MINIPRESS) 2 MG  capsule Take 2 mg by mouth at bedtime.   prochlorperazine (COMPAZINE) 10 MG tablet TAKE ONE TABLET BY MOUTH TWICE A DAY AS NEEDED FOR NAUSEA OR VOMITING   ramipril (ALTACE) 10 MG capsule TAKE 1 CAPSULE BY MOUTH EVERY DAY   Simethicone (GAS-X PO) Take 2 tablets by mouth daily as needed (gas).   tiZANidine (ZANAFLEX) 2 MG tablet Take 2 mg by mouth at bedtime.   traZODone (DESYREL) 100 MG tablet Take 3 tablets (300 mg total) by mouth at bedtime.   vitamin B-12 (CYANOCOBALAMIN) 1000 MCG tablet Take 1,000 mcg by mouth daily.   No facility-administered encounter medications on file as of 04/28/2021.    History Patient Active Problem List   Diagnosis Date Noted   Hearing impairment 04/28/2021   Memory difficulties 03/04/2021   Insomnia 56/25/6389   Monoallelic mutation of BRIP1 gene 01/17/2021   Arteriosclerosis of abdominal aorta ([place]) 12/02/2020   Urinary incontinence, mixed 04/02/2020   Diabetes mellitus type 2 in obese ([place]) 06/30/2019   Type 2 diabetes mellitus without complications ([place]) 37/34/2876   Spinal stenosis of lumbar region 06/21/2016   Encounter for chronic pain management 81/15/7262   Metabolic syndrome 03/55/9741   Hepatic steatosis 04/12/2011    Class: Chronic   Periodic limb movements of sleep 12/05/2010   Vitamin D deficiency 03/03/2010   HYPERCHOLESTEROLEMIA 12/24/2008  Chronic pain syndrome 12/24/2008   Functional gastrointestinal disorder 06/18/2008   SIGMOID POLYP 01/23/2008   HYPERTRIGLYCERIDEMIA 11/11/2007   Obesity, Class III, BMI 40-49.9 (morbid obesity) ([place]) 09/30/2007   RESTLESS LEGS SYNDROME 09/11/2007   Fibromyalgia syndrome 07/15/2007   Degenerative joint disease of both hips 05/23/2007   Hypothyroidism 02/15/2007   Bipolar 2 disorder ([place]) 01/03/2007   Hypertension associated with diabetes ([place]) 01/03/2007   Allergic rhinitis 01/03/2007   GERD without esophagitis 01/03/2007   Irritable bowel syndrome 01/03/2007   Osteoarthritis of spine  01/03/2007   Past Medical History:  Diagnosis Date   Adrenal adenoma, left 12/02/2020   CT AP 11/16/20:  left adrenal mass measuring approximately 2.7 cm. This is minimally increased in size from prior study and is consistent with a benign adrenal adenoma.    Adrenal nodule ([place]) 04/08/2011   04/08/11 Abdominal CT showed Left adrenal nodule which is tachnically indeterminate but most likely an adenoma.  It is 1.7 cm and 42 HU on portal venous phase image. 31 HU on delayed images.   01/09/2012 Abdominal CT w/ & w/o CM: Stable benign left adrenal adenoma. No further specific follow-up is needed for this finding.     Allergic rhinitis 01/03/2007   Qualifier: Diagnosis of  By: Drucie Ip     Allergy    Anemia    hx of  in 20's   Anxiety    Bipolar disorder ([place])    Carpal tunnel syndrome 02/14/2007   S/P carpel tunnel release bilaterally.     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 03/28/2018 03/28/2018   COPD WITHOUT EXACERBATION 01/03/2007   Qualifier: Diagnosis of  By: Drucie Ip  emphysema   Coronary artery calcification seen on CAT scan 12/03/2017   11/2017 Chest CT: Left anterior descending and right coronary atherosclerosis   Diabetes mellitus without complication (HCC)    O3Z 6.5 as of 05-2019- diet changes, no meds currently    DISC WITH RADICULOPATHY 01/03/2007   Qualifier: Diagnosis of  By: Drucie Ip     Emphysema of lung ([place])    Esophageal reflux 01/03/2007   Centricity Description: GASTROESOPHAGEAL REFLUX, NO ESOPHAGITIS Qualifier: Diagnosis of  By: Drucie Ip   Centricity Description: GERD Qualifier: Diagnosis of  By: Tye Savoy MD, [place]     External hemorrhoids s/p hemorrhoidectomy 03/28/2018 03/28/2018   Family history of breast cancer    Family history of cancer of female genital organ    Family history of colon cancer    Family history of gene  mutation    BRIP1 gene mutation (c.2010dup) in daughter   Family history of lung cancer    Fibromyalgia    Fracture of bone spur of inferior portion of calcaneus 10/06/2018   Functional gastrointestinal disorder 06/18/2008   Functional Gastrointestinal Disorder with frequent eructation and bloating sensation with acute flares.  Responsive to Compazine and Bentyl.  Takes a few days to calm down.     GERD without esophagitis 01/03/2007   Centricity Description: GASTROESOPHAGEAL REFLUX, NO ESOPHAGITIS Qualifier: Diagnosis of  By: Drucie Ip   Centricity Description: GERD Qualifier: Diagnosis of  By: Tye Savoy MD, [place]     Hearing impairment 04/28/2021   Heel spur, fracture, left 04/17/2018   HEMORRHOIDS, NOS 01/03/2007   Qualifier: Diagnosis of  By: Drucie Ip     Hepatic steatosis 04/12/2011   Herpes simplex labialis 05/11/2011   Hip  osteoarthritis 05/23/2007   MRI Pelvis (03/13/07) Bilateral osteoarthritis of the hips, left greater than right.  The  patient has a slightly more prominent hip effusion on the left than  the right.  No other significant abnormality.     History of MRSA infection 04/28/2011   HYPERCHOLESTEROLEMIA 12/24/2008   Hypertension    HYPERTENSION, BENIGN SYSTEMIC 01/03/2007   Qualifier: Diagnosis of  By: Drucie Ip     HYPERTRIGLYCERIDEMIA 11/11/2007   Qualifier: Diagnosis of  By: McDiarmid MD, Todd     Hypokalemia 12/12/2017   Hypothyroidism 02/15/2007   Qualifier: Diagnosis of  By: McDiarmid MD, Jannifer Franklin, BORDERLINE 02/15/2007   Irritable bowel syndrome 01/03/2007   Qualifier: Diagnosis of  By: Drucie Ip     LACTOSE INTOLERANCE 03/10/2008   Major depressive disorder, recurrent episode ([place]) 01/03/2007   Qualifier: Diagnosis of  By: Drucie Ip     Medication management 12/14/2016   Managing topiramate for weight loss starting 12/14/16   Obesity (BMI 30.0-34.9) 09/30/2007   Has lost 55 lbs since easter. Goal of <190 by 08/05/12.      Obesity, Class  III, BMI 40-49.9 (morbid obesity) ([place]) 09/30/2007        Osteoarthritis of spine 01/03/2007   MR I Lumbar (08/03): GeneralDDD; L2-3 Large  HNP.  Mod pos  hnp - 12/14/2005, smhnpw/irritL4root - 12/14/2005     OSTEOARTHRITIS, HANDS, BILATERAL 06/18/2008   Qualifier: Diagnosis of  By: McDiarmid MD, Kristeen Miss FEMORAL STRESS SYNDROME 01/03/2007   Qualifier: Diagnosis of  By: Drucie Ip     Periodic limb movements of sleep 12/05/2010   Diagnosed on Polysomnography testing Fall 2011.      Pneumonia    walking pneumonia in the 90's   Pre-diabetes 12/25/2008   Qualifier: Diagnosis of  By: McDiarmid MD, Todd     Prolapsed internal hemorrhoids, grade 3, s/p ligation/pexy/hemorrhoidectomy 03/28/2018 03/28/2018   RESTLESS LEGS SYNDROME 09/11/2007   Polysomnography (10/2010, Dr Keturah Barre, interpreter. ) showed periodic limb movements that frequently awoke patient from sleep with arousal index of 4 per hour. Dr Danton Sewer (Sleep Specialist) thought her RLS was the major contributor to patient poor sleep condition, more than any sleep apnea.  He recommended considering opamine agonist (either requip or mirapex) to see if this will help.      Rosacea, acne 10/24/2011   Solitary lung nodule 04/08/2011   Stage 1 mild COPD by GOLD classification ([place]) 01/03/2007   11/28/2017 Chest CT: Mild centrilobular and paraseptal emphysema with mild diffuse bronchial wall thickening     Stress incontinence    Synovial cyst of lumbar facet joint 02/17/2016   Trigger thumb of left hand 08/16/2018   Urticaria, chronic 05/22/2011   VITAMIN D DEFICIENCY 03/03/2010   Work-related condition 05/17/2018   Past Surgical History:  Procedure Laterality Date   APPENDECTOMY     bladder tack  1998   Dr Jeffie Pollock (urology)   BUNIONECTOMY  1992   bilateral feet with pins in great toes   [place]  2010   Bilateral, Dr Herbie Baltimore Sypher   COLONOSCOPY  2004   Dr Verdia Kuba (GI)   CYSTOCELE REPAIR   1998    Dr Gertie Fey (GYN)-   ESOPHAGOGASTRODUODENOSCOPY     EVALUATION UNDER ANESTHESIA WITH HEMORRHOIDECTOMY N/A 03/28/2018   Procedure: ANORECTAL EXAM UNDER ANESTHESIA lateral internal partial sphincterotomy, hemorrhoidal ligation pixie, HEMORRHOIDECTOMY;  Surgeon: Michael Boston, MD;  Location:  [facility];  Service: General;  Laterality: N/A;   EXAM ANORECTAL W/ ULTRASOUND     Dr. Johney Maine 03-28-18    EXPLORATORY LAPAROTOMY     FACIAL LACERATIONS REPAIR     Facial Laceration repair as adolescent    INJECTION HIP INTRA ARTICULAR  2008   Left Hip fluoroscopically-guided corticosteorid injection for painful osteoarthritis with good effect (06/2007)   LAPAROSCOPY FOR ECTOPIC PREGNANCY     LUMBAR LAMINECTOMY/DECOMPRESSION MICRODISCECTOMY N/A 06/21/2016   Procedure: MICRO LUMBAR DECOMPRESSION L4-L5 AND REMOVAL OF SYNOVIAL CYST    1 LEVEL;  Surgeon: Susa Day, MD;  Location: [facility];  Service: Orthopedics;  Laterality: N/A;   NM MYOVIEW LTD  2011   Dr Daneen Schick, III (Card)   RECTOCELE REPAIR  1998   Dr Gertie Fey (GYN)   SPHINCTEROTOMY N/A 03/28/2018   Procedure: ANAL SPHINCTEROTOMY;  Surgeon: Michael Boston, MD;  Location: [facility];  Service: General;  Laterality: N/A;   UPPER GASTROINTESTINAL ENDOSCOPY     Family History  Problem Relation Age of Onset   Diabetes Father    Heart disease Father    Diabetes type II Other    Hypertension Other    Heart attack Other    Alcohol abuse Other    Depression Other    Asthma Other    Migraines Other    Hypertension Mother    Dementia Mother    Hypertension Sister    Obesity Sister    Kidney disease Brother    Hypertension Daughter    Hypertension Son    Heart disease Brother    Hypertension Brother    Bipolar disorder Daughter    Hypertension Daughter    Breast cancer Daughter 34   Cancer Daughter 66       vulvar cancer   Other Daughter        BRIP1 gene mutation c.2010dup   Hypertension Son    Colon polyps Son 27   Diabetes Maternal Aunt     Lung cancer Paternal Aunt        d. >50   Heart Problems Paternal Grandmother    Diabetes Paternal Grandfather    Cancer Paternal Aunt        unknown type, dx >50   Cancer Paternal Great-grandmother        abdominal cancer NOS   Breast cancer Other        paternal great-great aunt (PGF's aunt)   Cancer Cousin 17       gynecologic cancer NOS (paternal first cousin)   Cancer Niece 81       gynecologic cancer NOS    Esophageal cancer Neg Hx    Stomach cancer Neg Hx    Rectal cancer Neg Hx    Colon cancer Neg Hx    Prostate cancer Neg Hx    Pancreatic cancer Neg Hx    Social History   Socioeconomic History   Marital status: Divorced    Spouse name: Not on file   Number of children: 4   Years of education: 12   Highest education level: 12th grade  Occupational History   Occupation: disability  Tobacco Use   Smoking status: Former    Packs/day: 1.50    Years: 44.00    Pack years: 66.00    Types: Cigarettes    Quit date: 04/06/2008    Years since quitting: 13.0   Smokeless tobacco: Never  Vaping Use   Vaping Use: Never used  Substance and Sexual Activity   Alcohol  use: No   Drug use: No    Comment: hx of marijuana use - last used 2 months ago    Sexual activity: Not Currently  Other Topics Concern   Not on file  Social History Narrative   Retired [employer] worker - early retirement for mental health reasons.    Has Worked part-time  at [employer].Marland KitchenMarland KitchenWorking as Scientist, water quality at [employer] on [address]   pt is separated.    Quit smoking tobacco June 09 started at age 56. 1 1/2 to 2 ppd .   No etoh    Hx of smokes marijuana - periodically attempts quitting.   Religious - Pentecostal   4 children    Hobbies- word searches and games on her tablet.   No IVDA         Social Information ( 04/12/2021)    patient lives with daughter in one level apartment ,for past 2 years .    Patient enjoys playing games on her ipad and watching movies . Primary family support persons is her  daughter.    Transportation to appointments provided by self;  No concerns or difficulty affording medication:   Strengths:Financial means, Religious Affiliation, Supportive family/friends   Support System: Family, Church, Spirituality and Able to International aid/development worker Activities of Daily Living    Dressing:Self-care   Eating: Self-care   Ambulation: Partial assistance   Toileting: Self-care   Bathing: Self-care       Instrumental Activities of Daily Living   Shopping: Partial assistance   House/Yard Work: Self-care   Administration of medications: Self-care   Finances: Self-care   Telephone: Self-care   Transportation: Self-care   Social Determinants of Health   Financial Resource Strain: Low Risk    Difficulty of Paying Living Expenses: Not hard at all  Food Insecurity: No Food Insecurity   Worried About Charity fundraiser in the Last Year: Never true   Arboriculturist in the Last Year: Never true  Transportation Needs: No Transportation Needs   Lack of Transportation (Medical): No   Lack of Transportation (Non-Medical): No  Physical Activity: Not on file  Stress: Stress Concern Present   Feeling of Stress : Rather much  Social Connections: Moderately Integrated   Frequency of Communication with Friends and Family: More than three times a week   Frequency of Social Gatherings with Friends and Family: Once a week   Attends Religious Services: More than 4 times per year   Active Member of [employer] or Organizations: Yes   Attends Archivist Meetings: Never   Marital Status: Divorced      Cardiovascular Risk Factors: History of Smoking, Hypertension, Lipids, NIDDM, and Obesity  Educational History: 12 years formal education Personal History of Seizures: No -  Personal History of Stroke: No  Personal History of Head Trauma: No  Personal History of Psychiatric Disorders: Yes - Bipolar 2 Family History of Dementia: Unknown    Basic Activities of  Daily Living  Dressing: Self-care Eating: Self-care Ambulation: Partial assistance Toileting: Self-care Bathing: Self-care  Instrumental Activities of Daily Living Shopping: Partial assistance House/Yard Work: Self-care Administration of medications: Self-care Finances: Self-care Telephone: Self-care Transportation: Self-care  Mobility Assist Devices: Cane 1- point  Caregivers in home: patient   FALLS in last five office visits:  Fall Risk  04/28/2021 03/03/2021 12/02/2020 09/16/2020 04/01/2020  Falls in the past year? 0 0 0 0 0  Number falls in past yr: 0 0 0 -  0  Injury with Fall? 0 0 0 - -  Risk Factor Category  - - - - -  Risk for fall due to : - - Impaired balance/gait;Impaired mobility - -  Follow up - - Falls evaluation completed - -    Health Maintenance reviewed: Immunization History  Administered Date(s) Administered   Influenza Split 07/22/2012   Influenza Whole 08/07/2007, 08/07/2008, 08/10/2009, 07/22/2010   Influenza, High Dose Seasonal PF 06/27/2017, 07/24/2018, 08/26/2020   Influenza,inj,Quad PF,6+ Mos 07/05/2017, 07/02/2019   Influenza-Unspecified 07/06/2013, 07/06/2014, 06/09/2015, 07/07/2016, 07/25/2018   PFIZER Comirnaty(Gray Top)Covid-19 Tri-Sucrose Vaccine 02/09/2021   PFIZER(Purple Top)SARS-COV-2 Vaccination 12/23/2019, 01/13/2020, 08/26/2020   Pneumococcal Conjugate-13 10/07/2015   Pneumococcal Polysaccharide-23 08/20/2014, 03/22/2018   Td 03/06/2002   Tdap 10/20/2013   Zoster Recombinat (Shingrix) 05/05/2017, 12/07/2017   The patient has no Health Maintenance topics of status Overdue, Due On, or Due Soon  Diet: Regular Nutritional supplements: none   Geriatric Syndromes: Constipation yes  Laxative use:no   Incontinence yes  Nocturia: no Dizziness no   Syncope no  Balance impairment:no    Skin problems no   Visual Impairment no  - corrective lenses Hearing impairment yes Dentures problems: yes Dry mouth: no  Eating impairment no   Impaired Memory or Cognition no   Behavioral problems no   Sleep problems no   Weight loss no Drug Misadventure: no   Joint pain: yes Joint stiffness: yes Osteoporosis: no, DEXA 2017 reviewed  Pressure Ulcers: no Immobility: no Ankle edema: no History of UTIs: no  Hearing Screening   '250Hz'  '500Hz'  '1000Hz'  '2000Hz'  '4000Hz'   Right ear Pass Fail Fail Pass Fail  Left ear Pass Pass Fail Pass Fail  Comments: Patient heard all tones on the 40 dBHL  frequency in both ears.     Vital Signs Weight: 248 lb 6 oz (112.7 kg) Body mass index is 42.63 kg/m. CrCl cannot be calculated (Patient's most recent lab result is older than the maximum 21 days allowed.). Body surface area is 2.26 meters squared. Vitals:   04/28/21 1422  BP: (!) 150/70  Pulse: 62  SpO2: 95%  Weight: 248 lb 6 oz (112.7 kg)  Height: '5\' 4"'  (1.626 m)   Wt Readings from Last 3 Encounters:  04/28/21 248 lb 6 oz (112.7 kg)  03/08/21 246 lb (111.6 kg)  03/03/21 248 lb 2 oz (112.5 kg)   Hearing Screening   '250Hz'  '500Hz'  '1000Hz'  '2000Hz'  '4000Hz'   Right ear Pass Fail Fail Pass Fail  Left ear Pass Pass Fail Pass Fail  Comments: Patient heard all tones on the 40 dBHL  frequency in both ears.    Physical Examination:  VS reviewed GEN: Alert, Cooperative, Groomed, NAD EXT: No peripheral leg edema.  SKIN: No lesion nor rashes of face/trunk/extremities Neuro: Oriented to person, place, and time Psych: Normal affect/thought/speech/language  6CIT Screen 07/02/2019  What Year? 0 points  What month? 0 points  What time? 0 points  Count back from 20 0 points  Months in reverse 0 points  Repeat phrase 0 points  Total Score 0    No flowsheet data found.      Montreal Cognitive Assessment  04/28/2021  Visuospatial/ Executive (0/5) 5  Naming (0/3) 3  Attention: Read list of digits (0/2) 2  Attention: Read list of letters (0/1) 1  Attention: Serial 7 subtraction starting at 100 (0/3) 3  Language: Repeat phrase (0/2) 1  Language  : Fluency (0/1) 1  Abstraction (0/2) 2  Delayed Recall (0/5) 4  Orientation (0/6) 6  Total 28  Adjusted Score (based on education) 29     Labs No components found for: VITAMIND  No results found for: VITAMINB12  No results found for: FOLATE  Lab Results  Component Value Date   TSH 4.390 11/15/2017    No results found for: RPR  Lab Results  Component Value Date   HIV NONREACTIVE 03/04/2015      Chemistry      Component Value Date/Time   NA 136 11/16/2020 1200   NA 138 04/01/2020 1705   K 3.7 11/16/2020 1200   CL 100 11/16/2020 1200   CO2 23 11/16/2020 1200   BUN 12 11/16/2020 1200   BUN 17 04/01/2020 1705   CREATININE 0.90 11/16/2020 1200   CREATININE 0.78 06/17/2015 1208      Component Value Date/Time   CALCIUM 10.2 11/16/2020 1200   ALKPHOS 62 11/16/2020 1200   AST 27 11/16/2020 1200   ALT 35 11/16/2020 1200   BILITOT 0.7 11/16/2020 1200       CrCl cannot be calculated (Patient's most recent lab result is older than the maximum 21 days allowed.).   Lab Results  Component Value Date   HGBA1C 6.2 (A) 12/02/2020     '@10RELATIVEDAYS' @ Hearing Screening   '250Hz'  '500Hz'  '1000Hz'  '2000Hz'  '4000Hz'   Right ear Pass Fail Fail Pass Fail  Left ear Pass Pass Fail Pass Fail  Comments: Patient heard all tones on the 40 dBHL  frequency in both ears.   Lab Results  Component Value Date   WBC 10.9 (H) 11/16/2020   HGB 15.2 (H) 11/16/2020   HCT 46.0 11/16/2020   MCV 91.1 11/16/2020   PLT 247 11/16/2020    No results found for this or any previous visit (from the past 24 hour(s)).  Imaging None   Personal Strengths Financial means Religious Affiliation Supportive family/friends  Support System Strengths Family, Church, Spirituality, and Able to Communicate Effectively   Advanced Directives Advance Directives: Completed     ------------------------------------------------------------------------------------------------------------------------------------------------------------------------------------------------------------------------------------------------------------------------------------------------------------------------------------------------------------------------------------------------------------------------------------------------------------------------------------------------------------  Assessment and Plan: Please see individual consultation notes from physical therapy, pharmacy and social work for today.   Normal MOCA Patient's MOCA and detailed history does not show any signs of dementia or cognitive impairment.  The patient is on multiple medications that can increase risk of future dementia.  Patient will review these medications further at follow-up with Dr. McDiarmid.  Please also see pharmacy note for further recommendations.  Yalonda was seen today for memory loss.  Diagnoses and all orders for this visit:  Bilateral hearing loss, unspecified hearing loss type   Primary Contact: Extended Emergency Contact Information Primary Emergency Contact: Juleen China, [place] [address] [place] [employer] Phone: [phone] Relation: Son Secondary Emergency Contact: Allen,Lisa Address: [address]          [place], [place] [address] [place] Phone: [phone] Relation: Daughter   > 60 minutes face to face were spent in total with interdisciplinary discussion, patient and caretaker counseling and coordination of care took more than 20 minutes. The Geriatric interdisciplinary team meet to discuss the patient's assessment, problem list, and recommendations.  The interdisciplinary team consisted of representatives from medicine, pharmacy, physical therapy and social work. The interdisciplinary team meet with the patient and caretakers to review  the team's findings, assessments, and recommendations.

## 2021-04-28 NOTE — Patient Instructions (Addendum)
DUE TO COVID-19 ONLY ONE VISITOR IS ALLOWED TO COME WITH YOU AND STAY IN THE WAITING ROOM ONLY DURING PRE OP AND PROCEDURE.   **NO VISITORS ARE ALLOWED IN THE SHORT STAY AREA OR RECOVERY ROOM!!**         Your procedure is scheduled on:   Thursday, 05-19-21   Report to [facility] Main  Entrance   Report to Short Stay at 5:15 AM   [facility])   Call this number if you have problems the morning of surgery [phone]   Do not eat food :After Midnight.   May have liquids until  4:30 AM day of surgery  CLEAR LIQUID DIET  Foods Allowed                                                                     Foods Excluded  Water, Black Coffee and tea, regular and decaf                liquids that you cannot  Plain Jell-O in any flavor  (No red)                                      see through such as: Fruit ices (not with fruit pulp)                                      milk, soups, orange juice              Iced Popsicles (No red)                                      All solid food                                   Apple juices Sports drinks like Gatorade (No red) Lightly seasoned clear broth or consume(fat free) Sugar, honey syrup      Oral Hygiene is also important to reduce your risk of infection.                                    Remember - BRUSH YOUR TEETH THE MORNING OF SURGERY WITH YOUR REGULAR TOOTHPASTE   Do NOT smoke after Midnight  Take these medicines the morning of surgery with A SIP OF WATER:  Atorvastatin, Allegra, Darifenacin, Levothyroxine, Lorazepam, Metoprolol, Omeprazole  DO NOT TAKE ANY ORAL DIABETIC MEDICATIONS DAY OF YOUR SURGERY                              You may not have any metal on your body including hair pins, jewelry, and body piercing             Do not wear make-up, lotions, powders, perfumes or deodorant  Do not wear nail polish including  gel and S&S, artificial/acrylic nails, or any other type of covering on natural nails including  finger and toenails. If you have artificial nails, gel coating, etc. that needs to be removed by a nail salon please have this removed prior to surgery or surgery may need to be canceled/ delayed if the surgeon/ anesthesia feels like they are unable to be safely monitored.   Do not shave  48 hours prior to surgery.             Do not bring valuables to the hospital. [place].   Contacts, dentures or bridgework may not be worn into surgery.   Patients discharged the day of surgery will not be allowed to drive home.    Please read over the following fact sheets you were given: IF YOU HAVE QUESTIONS ABOUT YOUR PRE OP INSTRUCTIONS PLEASE CALL [facility] - Preparing for Surgery Before surgery, you can play an important role.  Because skin is not sterile, your skin needs to be as free of germs as possible.  You can reduce the number of germs on your skin by washing with CHG (chlorahexidine gluconate) soap before surgery.  CHG is an antiseptic cleaner which kills germs and bonds with the skin to continue killing germs even after washing. Please DO NOT use if you have an allergy to CHG or antibacterial soaps.  If your skin becomes reddened/irritated stop using the CHG and inform your nurse when you arrive at Short Stay. Do not shave (including legs and underarms) for at least 48 hours prior to the first CHG shower.  You may shave your face/neck.  Please follow these instructions carefully:  1.  Shower with CHG Soap the night before surgery and the  morning of surgery.  2.  If you choose to wash your hair, wash your hair first as usual with your normal  shampoo.  3.  After you shampoo, rinse your hair and body thoroughly to remove the shampoo.                             4.  Use CHG as you would any other liquid soap.  You can apply chg directly to the skin and wash.  Gently with a scrungie or clean washcloth.  5.  Apply the CHG Soap to your  body ONLY FROM THE NECK DOWN.   Do   not use on face/ open                           Wound or open sores. Avoid contact with eyes, ears mouth and   genitals (private parts).                       Wash face,  Genitals (private parts) with your normal soap.             6.  Wash thoroughly, paying special attention to the area where your    surgery  will be performed.  7.  Thoroughly rinse your body with warm water from the neck down.  8.  DO NOT shower/wash with your normal soap after using and rinsing off the CHG Soap.                9.  Pat yourself dry with a clean towel.  10.  Wear clean pajamas.            11.  Place clean sheets on your bed the night of your first shower and do not  sleep with pets. Day of Surgery : Do not apply any lotions/deodorants the morning of surgery.  Please wear clean clothes to the hospital/surgery center.  FAILURE TO FOLLOW THESE INSTRUCTIONS MAY RESULT IN THE CANCELLATION OF YOUR SURGERY  PATIENT SIGNATURE_________________________________  NURSE SIGNATURE__________________________________  ________________________________________________________________________

## 2021-04-28 NOTE — Progress Notes (Signed)
S: Pharmacy consulted to complete medication reconciliation for geriatric clinic. Patient arrives in good spirits, ambulating with a cane and by herself. Medication reconciliation completed by patient.  Medication Issues Identified: 1. Patient reports taking Bentyl (dicyclomine) twice a day every day (previously told to take as needed).  2. Patient reports her sleeping medications (hydroxyzine 150 mg daily, trazodone 300 mg daily) are not efficacious, reports sleeping during the day.  3. Patient reports taking Tizanidine 2 mg daily for muscle spasms (not on med list). 4. Patient presents with a bottle of expired SL Nitroglycerin (expired May 2021)  5. Patient reports taking Omeprazole 40 mg daily for at least ten years, may be contributing to cognitive decline and no recent symptoms of GERD   Plan: 1. Recommend taking dicyclomine as needed.  2. Recommend counseling patient on sleep hygiene and potentially de-prescribing sleeping medications.  3. Tizanidine added to patient profile.  4. Discussed with Dr. McDiarmid, nitroglycerin not indicated and will discontinue. 5. Counseled patient she could take omeprazole as needed for GERD related symptoms.   Patient seen by: Caren Macadam, PharmD Candidate, Lorel Monaco, PharmD BCPS

## 2021-04-29 ENCOUNTER — Encounter: Payer: Self-pay | Admitting: Family Medicine

## 2021-04-29 ENCOUNTER — Other Ambulatory Visit: Payer: Self-pay | Admitting: Family Medicine

## 2021-04-29 DIAGNOSIS — K580 Irritable bowel syndrome with diarrhea: Secondary | ICD-10-CM

## 2021-04-29 DIAGNOSIS — Z789 Other specified health status: Secondary | ICD-10-CM | POA: Insufficient documentation

## 2021-04-29 DIAGNOSIS — Z79899 Other long term (current) drug therapy: Secondary | ICD-10-CM | POA: Insufficient documentation

## 2021-04-29 DIAGNOSIS — K529 Noninfective gastroenteritis and colitis, unspecified: Secondary | ICD-10-CM

## 2021-04-29 HISTORY — DX: Other specified health status: Z78.9

## 2021-04-29 NOTE — Patient Instructions (Signed)

## 2021-04-29 NOTE — Progress Notes (Signed)
I have interviewed and examined the patient with Dr Vista Lawman. I agree with their documentation and management in their geriatric medicine consultation note for today.   Memory loss concern with no evidence of memory loss on cognitive testing.   Will discuss medications Ms Kinn is taking that could be interfering with her cognition incluing Bentyl, MS Contin and Norco, hydroxyzine, Lorazepam, Prazosin, trazodone and compazine  We will discuss cutting down on PPI dose next office visit.   Will discuss formal hearling assessment given mild hearing loss on screening sweep audiometry.  59 minutes face to face where spent in total with counseling / coordination of care took more than 50% of the total time. Counseling involved discussion of the multiple cognitive tests and function test results with patient.  Discipline of medicine, social work, and pharmacy meet with the patient and their family member(s) for evaluation. The team of disciplines meet to discuss the patient's case and develop a problem list with proposed interventions.

## 2021-04-29 NOTE — Assessment & Plan Note (Signed)
04/28/21  Hearing Screening   250Hz  500Hz  1000Hz  2000Hz  4000Hz   Right ear Pass Fail Fail Pass Fail  Left ear Pass Pass Fail Pass Fail  [place] mild hearing loss New diagnosis Discuss formal evaluation next office visit

## 2021-05-02 ENCOUNTER — Encounter: Payer: Self-pay | Admitting: Family Medicine

## 2021-05-02 ENCOUNTER — Other Ambulatory Visit: Payer: Self-pay | Admitting: Family Medicine

## 2021-05-02 DIAGNOSIS — M48061 Spinal stenosis, lumbar region without neurogenic claudication: Secondary | ICD-10-CM

## 2021-05-02 DIAGNOSIS — G894 Chronic pain syndrome: Secondary | ICD-10-CM

## 2021-05-02 DIAGNOSIS — M47896 Other spondylosis, lumbar region: Secondary | ICD-10-CM

## 2021-05-02 DIAGNOSIS — G2581 Restless legs syndrome: Secondary | ICD-10-CM

## 2021-05-02 DIAGNOSIS — M160 Bilateral primary osteoarthritis of hip: Secondary | ICD-10-CM

## 2021-05-02 MED ORDER — HYDROCODONE-ACETAMINOPHEN 10-325 MG PO TABS: 1.0000 | ORAL_TABLET | Freq: Four times a day (QID) | ORAL | 0 refills | Status: DC | PRN

## 2021-05-02 MED ORDER — MORPHINE SULFATE ER 15 MG PO TBCR
15.0000 mg | EXTENDED_RELEASE_TABLET | Freq: Two times a day (BID) | ORAL | 0 refills | Status: DC
Start: 2021-05-05 — End: 2021-06-04

## 2021-05-02 NOTE — Telephone Encounter (Signed)
Approved refill

## 2021-05-06 ENCOUNTER — Encounter: Payer: Self-pay | Admitting: Gynecologic Oncology

## 2021-05-10 ENCOUNTER — Encounter (HOSPITAL_COMMUNITY): Payer: Self-pay

## 2021-05-10 ENCOUNTER — Telehealth: Payer: Self-pay | Admitting: *Deleted

## 2021-05-10 ENCOUNTER — Other Ambulatory Visit: Payer: Self-pay

## 2021-05-10 ENCOUNTER — Encounter (HOSPITAL_COMMUNITY)
Admission: RE | Admit: 2021-05-10 | Discharge: 2021-05-10 | Disposition: A | Discharge status: Home / Self Care | Payer: PPO | Source: Ambulatory Visit | Attending: Gynecologic Oncology | Admitting: Gynecologic Oncology

## 2021-05-10 ENCOUNTER — Telehealth: Payer: Self-pay

## 2021-05-10 ENCOUNTER — Inpatient Hospital Stay: Payer: PPO | Attending: Gynecologic Oncology | Admitting: Gynecologic Oncology

## 2021-05-10 VITALS — BP 136/62 | HR 61 | Temp 97.6°F | Resp 18 | Ht 64.0 in | Wt 243.6 lb

## 2021-05-10 DIAGNOSIS — Z1501 Genetic susceptibility to malignant neoplasm of breast: Secondary | ICD-10-CM

## 2021-05-10 DIAGNOSIS — Z148 Genetic carrier of other disease: Secondary | ICD-10-CM

## 2021-05-10 DIAGNOSIS — Z1589 Genetic susceptibility to other disease: Secondary | ICD-10-CM

## 2021-05-10 DIAGNOSIS — Z01818 Encounter for other preprocedural examination: Secondary | ICD-10-CM | POA: Insufficient documentation

## 2021-05-10 LAB — CBC
HCT: 44.4 % (ref 36.0–46.0)
Hemoglobin: 14.7 g/dL (ref 12.0–15.0)
MCH: 30.6 pg (ref 26.0–34.0)
MCHC: 33.1 g/dL (ref 30.0–36.0)
MCV: 92.3 fL (ref 80.0–100.0)
Platelets: 255 10*3/uL (ref 150–400)
RBC: 4.81 MIL/uL (ref 3.87–5.11)
RDW: 12.4 % (ref 11.5–15.5)
WBC: 7.1 10*3/uL (ref 4.0–10.5)
nRBC: 0 % (ref 0.0–0.2)

## 2021-05-10 LAB — SURGICAL PCR SCREEN
MRSA, PCR: NEGATIVE
Staphylococcus aureus: NEGATIVE

## 2021-05-10 LAB — BASIC METABOLIC PANEL
Anion gap: 8 (ref 5–15)
BUN: 13 mg/dL (ref 8–23)
CO2: 29 mmol/L (ref 22–32)
Calcium: 9.4 mg/dL (ref 8.9–10.3)
Chloride: 100 mmol/L (ref 98–111)
Creatinine, Ser: 0.93 mg/dL (ref 0.44–1.00)
GFR, Estimated: >60 mL/min (ref >60)
Glucose, Bld: 181 mg/dL — ABNORMAL HIGH (ref 70–99)
Potassium: 4.4 mmol/L (ref 3.5–5.1)
Sodium: 137 mmol/L (ref 135–145)

## 2021-05-10 LAB — HEMOGLOBIN A1C
Hgb A1c MFr Bld: 6.3 % — ABNORMAL HIGH (ref 4.8–5.6)
Mean Plasma Glucose: 134.11 mg/dL

## 2021-05-10 LAB — GLUCOSE, CAPILLARY: Glucose-Capillary: 187 mg/dL — ABNORMAL HIGH (ref 70–99)

## 2021-05-10 MED ORDER — SENNOSIDES-DOCUSATE SODIUM 8.6-50 MG PO TABS: 2.0000 | ORAL_TABLET | Freq: Every day | ORAL | 0 refills | Status: AC

## 2021-05-10 NOTE — Progress Notes (Signed)
Please enter orders for surgery scheduled for 05/19/21.

## 2021-05-10 NOTE — Progress Notes (Signed)
Patient here for a pre-operative appointment prior to her scheduled surgery on May 19, 2021. She is scheduled for a robotic assisted unilateral salpingo-oophorectomy vs bilateral salpingo-oophorectomy (removal of both ovaries and fallopian tubes), pelvic washings, possible laparotomy (larger incision on the abdomen if needed), possible staging.  She had her pre-admission testing appointment this am at [facility].  The surgery was discussed in detail.  See after visit summary for additional details. Visual aids used to discuss items related to surgery including sequential compression stockings, foley catheter, IV pump, multi-modal pain regimen including tylenol, photo of the surgical robot, female reproductive system to discuss surgery in detail.      Discussed post-op pain management in detail including the aspects of the enhanced recovery pathway. She is currently on a chronic pain medication regimen and will continue this in the post-operative period. She has hydrocodone/APAP that she can use for breakthrough pain if needed. Sennakot was prescribed to be used after surgery and to hold if having loose stools.  Discussed bowel regimen in detail.     Discussed the use of SCDs, and measures to take at home to prevent DVT including frequent mobility. She states she spends the majority of her day in bed. Discussed the risks with this and ways to increase movement discussed. Reportable signs and symptoms of DVT discussed. Post-operative instructions discussed and expectations for after surgery. Incisional care discussed as well including reportable signs and symptoms including erythema, drainage, wound separation.     10 minutes spent with the patient.  Verbalizing understanding of material discussed. No needs or concerns voiced at the end of the visit.   Advised patient to call for any needs. We will order her an abdominal binder to have for the post-operative period. Advised that her post-operative medication  (sennakot-S) had been prescribed and could be picked up at any time.     This appointment is included in the global surgical bundle as a pre-operative discussion and has no charge.

## 2021-05-10 NOTE — Patient Instructions (Addendum)
Preparing for your Surgery  Plan for surgery on May 19, 2021 with Dr. Jeral Pinch at [facility] will be scheduled for a robotic assisted unilateral salpingo-oophorectomy vs bilateral salpingo-oophorectomy (removal of both ovaries and fallopian tubes), pelvic washings, possible laparotomy (larger incision on the abdomen if needed), possible staging.  Pre-operative Testing -You will receive a phone call from presurgical testing at [facility] to arrange for a pre-operative appointment and lab work.  -Bring your insurance card, copy of an advanced directive if applicable, medication list  -At that visit, you will be asked to sign a consent for a possible blood transfusion in case a transfusion becomes necessary during surgery.  The need for a blood transfusion is rare but having consent is a necessary part of your care.     -You should not be taking blood thinners or aspirin at least ten days prior to surgery unless instructed by your surgeon.  -Do not take supplements such as fish oil (omega 3), red yeast rice, turmeric before your surgery. You want to avoid medications with aspirin in them including headache powders such as BC or Goody's), Excedrin migraine.  Day Before Surgery at [place] will be asked to take in a light diet the day before surgery. You will be advised you can have clear liquids up until 3 hours before your surgery.    Eat a light diet the day before surgery.  Examples including soups, broths, toast, yogurt, mashed potatoes.  AVOID GAS PRODUCING FOODS. Things to avoid include carbonated beverages (fizzy beverages, sodas), raw fruits and raw vegetables (uncooked), or beans.   If your bowels are filled with gas, your surgeon will have difficulty visualizing your pelvic organs which increases your surgical risks.  Your role in recovery Your role is to become active as soon as directed by your doctor, while still giving yourself time to heal.  Rest  when you feel tired. You will be asked to do the following in order to speed your recovery:  - Cough and breathe deeply. This helps to clear and expand your lungs and can prevent pneumonia after surgery.  - [place]. Do mild physical activity. Walking or moving your legs help your circulation and body functions return to normal. Do not try to get up or walk alone the first time after surgery.   -If you develop swelling on one leg or the other, pain in the back of your leg, redness/warmth in one of your legs, please call the office or go to the Emergency Room to have a doppler to rule out a blood clot. For shortness of breath, chest pain-seek care in the Emergency Room as soon as possible. - Actively manage your pain. Managing your pain lets you move in comfort. We will ask you to rate your pain on a scale of zero to 10. It is your responsibility to tell your doctor or nurse where and how much you hurt so your pain can be treated.  Special Considerations -If you are diabetic, you may be placed on insulin after surgery to have closer control over your blood sugars to promote healing and recovery.  This does not mean that you will be discharged on insulin.  If applicable, your oral antidiabetics will be resumed when you are tolerating a solid diet.  -Your final pathology results from surgery should be available around one week after surgery and the results will be relayed to you when available.  -Dr. Lahoma Crocker  is the surgeon that assists your GYN Oncologist with surgery.  If you end up staying the night, the next day after your surgery you will either see Dr. Denman George, Dr. Berline Lopes, or Dr. Lahoma Crocker.  -FMLA forms can be faxed to [phone] and please allow 5-7 business days for completion.  Pain Management After Surgery -You can continue on your current pain medication regimen.  Bowel Regimen -You have been prescribed Sennakot-S to take nightly to prevent  constipation especially if you are taking the narcotic pain medication intermittently.  It is important to prevent constipation and drink adequate amounts of liquids. You can stop taking this medication when you are not taking pain medication and you are back on your normal bowel routine.  Risks of Surgery Risks of surgery are low but include bleeding, infection, damage to surrounding structures, re-operation, blood clots, and very rarely death.  Blood Transfusion Information (For the consent to be signed before surgery)  We will be checking your blood type before surgery so in case of emergencies, we will know what type of blood you would need.                                            WHAT IS A BLOOD TRANSFUSION?  A transfusion is the replacement of blood or some of its parts. Blood is made up of multiple cells which provide different functions. Red blood cells carry oxygen and are used for blood loss replacement. White blood cells fight against infection. Platelets control bleeding. Plasma helps clot blood. Other blood products are available for specialized needs, such as hemophilia or other clotting disorders. BEFORE THE TRANSFUSION  Who gives blood for transfusions?  You may be able to donate blood to be used at a later date on yourself (autologous donation). Relatives can be asked to donate blood. This is generally not any safer than if you have received blood from a stranger. The same precautions are taken to ensure safety when a relative's blood is donated. Healthy volunteers who are fully evaluated to make sure their blood is safe. This is blood bank blood. Transfusion therapy is the safest it has ever been in the practice of medicine. Before blood is taken from a donor, a complete history is taken to make sure that person has no history of diseases nor engages in risky social behavior (examples are intravenous drug use or sexual activity with multiple partners). The donor's travel  history is screened to minimize risk of transmitting infections, such as malaria. The donated blood is tested for signs of infectious diseases, such as HIV and hepatitis. The blood is then tested to be sure it is compatible with you in order to minimize the chance of a transfusion reaction. If you or a relative donates blood, this is often done in anticipation of surgery and is not appropriate for emergency situations. It takes many days to process the donated blood. RISKS AND COMPLICATIONS Although transfusion therapy is very safe and saves many lives, the main dangers of transfusion include:  Getting an infectious disease. Developing a transfusion reaction. This is an allergic reaction to something in the blood you were given. Every precaution is taken to prevent this. The decision to have a blood transfusion has been considered carefully by your caregiver before blood is given. Blood is not given unless the benefits outweigh the risks.  AFTER SURGERY INSTRUCTIONS  Return to work: 4 weeks if applicable  Activity: 1. Be up and out of the bed during the day.  Take a nap if needed.  You may walk up steps but be careful and use the hand rail.  Stair climbing will tire you more than you think, you may need to stop part way and rest.   2. No lifting or straining for 6 weeks over 10 pounds. No pushing, pulling, straining for 6 weeks.  3. No driving for 1 week(s).  Do not drive if you are taking narcotic pain medicine and make sure that your reaction time has returned.   4. You can shower as soon as the next day after surgery. Shower daily.  Use your regular soap and water (not directly on the incision) and pat your incision(s) dry afterwards; don't rub.  No tub baths or submerging your body in water until cleared by your surgeon. If you have the soap that was given to you by pre-surgical testing that was used before surgery, you do not need to use it afterwards because this can irritate your incisions.    5. No sexual activity and nothing in the vagina for 4 weeks.  6. You may experience a small amount of clear drainage from your incisions, which is normal.  If the drainage persists, increases, or changes color please call the office.  7. Do not use creams, lotions, or ointments such as neosporin on your incisions after surgery until advised by your surgeon because they can cause removal of the dermabond glue on your incisions.    8. You can continue on your current pain medication regimen.  Diet: 1. Low sodium Heart Healthy Diet is recommended but you are cleared to resume your normal (before surgery) diet after your procedure.  2. It is safe to use a laxative, such as Miralax or Colace, if you have difficulty moving your bowels. You have been prescribed Sennakot at bedtime every evening to keep bowel movements regular and to prevent constipation.    Wound Care: 1. Keep clean and dry.  Shower daily.  Reasons to call the Doctor: Fever - Oral temperature greater than 100.4 degrees Fahrenheit Foul-smelling vaginal discharge Difficulty urinating Nausea and vomiting Increased pain at the site of the incision that is unrelieved with pain medicine. Difficulty breathing with or without chest pain New calf pain especially if only on one side Sudden, continuing increased vaginal bleeding with or without clots.   Contacts: For questions or concerns you should contact:  Dr. Jeral Pinch at [phone]  [name], NP at [phone]  After Hours: call [phone] and have the GYN Oncologist paged/contacted (after 5 pm or on the weekends).  Messages sent via mychart are for non-urgent matters and are not responded to after hours so for urgent needs, please call the after hours number.

## 2021-05-10 NOTE — Telephone Encounter (Signed)
Spoke with [name]  in preadmission testing. The [facility] will be removed prior to going into surgery.  [name] has 8 days left on the monitor so it will come off on 05-18-21.  She will reapply monitor  when she returns home from her surgery.

## 2021-05-10 NOTE — Telephone Encounter (Signed)
Fax records and stratification/optimization to the patient's PCP

## 2021-05-13 ENCOUNTER — Telehealth: Payer: Self-pay | Admitting: *Deleted

## 2021-05-13 NOTE — Telephone Encounter (Signed)
Spoke with the patient's PCP office, they have received the surgical optimization form and are working on it.

## 2021-05-17 ENCOUNTER — Ambulatory Visit: Payer: PPO | Admitting: Licensed Clinical Social Worker

## 2021-05-17 ENCOUNTER — Telehealth: Payer: Self-pay | Admitting: *Deleted

## 2021-05-17 DIAGNOSIS — Z789 Other specified health status: Secondary | ICD-10-CM

## 2021-05-17 NOTE — Telephone Encounter (Signed)
Called Dr McDiarmid's office regarding the surgical stratification/optimization form, per nurse the office is not sure if they received the form. Form and records refax and explained that the surgery is 7/14

## 2021-05-17 NOTE — Chronic Care Management (AMB) (Signed)
Care Management   Clinical Social Work Note  05/17/2021 Name: [name] MRN: [id number] DOB: 1951/01/26  [name] is a 70 y.o. year old female who is a primary care patient of [name]. The CCM team was consulted to assist the patient with chronic disease management and/or care coordination needs related to: Holiday representative.   Engaged with patient by telephone for follow up visit in response to provider referral for social work chronic care management and care coordination services.   Consent to Services: The patient was given information about Chronic Care Management services, agreed to services, and gave verbal consent prior to initiation of services.  Please see initial visit note for detailed documentation.   Patient agreed to services and consent obtained.   Assessment:  Patient completed goal of advance directive . See Care Plan below for interventions and patient self-care actives.  Recent life changes [name]: preparing for surgery this week which is causing a level of anxiety for her.  Recommendation: Patient may benefit from, and is in agreement to continue to follow up with current psychiatrist for ongoing mental health support.  Follow up Plan: Patient does not require or desire continued follow-up. Will contact the office if needed,  Patient may benefit from and is in agreement for CCM LCSW to remain part of care team for the next 60 days.  If no needs are identified in the next 60 days, CCM LCSW will disconnect from the care team.   Review of patient past medical history, allergies, medications, and health status, including review of relevant consultants reports was performed today as part of a comprehensive evaluation and provision of chronic care management and care coordination services.     SDOH (Social Determinants of Health) assessments and interventions performed:    Advanced Directives Status: See [employer] application for related  entries.  CCM Care Plan  Allergies  Allergen Reactions   Diclofenac-Misoprostol Shortness Of Breath    elevated blood pressure   Aripiprazole     Doesn't want to take because of possible side effects   Emsam [Selegiline] Other (See Comments)    Burned the skin after using for a year   Estradiol Swelling   Ibuprofen Hives   Naproxen Nausea Only   Neurontin [Gabapentin] Hives and Swelling   Paroxetine Other (See Comments)    Panic attacks   Prednisone Other (See Comments)    Severe depression   Tape Other (See Comments)    Paper tape caused blisters, plastic tape ok    Outpatient Encounter Medications as of 05/17/2021  Medication Sig   atorvastatin (LIPITOR) 20 MG tablet TAKE 1 TABLET BY MOUTH EVERY DAY (Patient taking differently: Take 20 mg by mouth daily.)   Calcium Carb-Cholecalciferol (CALCIUM-VITAMIN D) 600-400 MG-UNIT TABS Take 1 tablet by mouth daily.   Carboxymethylcellulose Sodium (THERATEARS) 0.25 % SOLN Place 1 drop into both eyes daily as needed (dry eyes).   citalopram (CELEXA) 40 MG tablet Take 0.5 tablets (20 mg total) by mouth at bedtime. (Patient taking differently: Take 40 mg by mouth daily.)   Continuous Blood Gluc Sensor (FREESTYLE LIBRE 14 DAY SENSOR) MISC APPLY EVERY 14 DAYS   darifenacin (ENABLEX) 7.5 MG 24 hr tablet TAKE 1 TABLET BY MOUTH EVERY DAY (Patient taking differently: Take 7.5 mg by mouth daily.)   dicyclomine (BENTYL) 20 MG tablet TAKE 1 TABLET (20 MG TOTAL) BY MOUTH EVERY 8 (EIGHT) HOURS AS NEEDED FOR SPASMS. (Patient taking differently: Take 20 mg by mouth  in the morning and at bedtime.)   fexofenadine (ALLEGRA) 180 MG tablet Take 180 mg by mouth daily.   fluticasone (FLONASE) 50 MCG/ACT nasal spray Place 2 sprays into both nostrils daily.   hydrochlorothiazide (HYDRODIURIL) 25 MG tablet TAKE 1 TABLET BY MOUTH EVERY DAY (Patient taking differently: Take 25 mg by mouth daily.)   HYDROcodone-acetaminophen (NORCO) 10-325 MG tablet Take 1 tablet by  mouth every 6 (six) hours as needed. (Patient taking differently: Take 1 tablet by mouth at bedtime.)   hydrOXYzine (VISTARIL) 50 MG capsule Take 150 mg by mouth at bedtime.    ketoconazole (NIZORAL) 2 % cream APPLY TOPICALLY TO AFFECTED AREA EVERY DAY (Patient taking differently: Apply 1 application topically daily as needed for irritation.)   KLOR-CON M10 10 MEQ tablet TAKE 4 TABLETS (40 MEQ TOTAL) BY MOUTH AT BEDTIME. (Patient taking differently: Take 40 mEq by mouth at bedtime.)   levothyroxine (SYNTHROID) 125 MCG tablet TAKE 1 TABLET (125 MCG TOTAL) BY MOUTH EVERY MORNING. 30 MINUTES BEFORE FOOD   loperamide (IMODIUM A-D) 2 MG capsule Take 1 capsule (2 mg total) by mouth as needed for diarrhea or loose stools.   LORazepam (ATIVAN) 1 MG tablet Take 1 mg by mouth 2 (two) times daily as needed for anxiety.   metoprolol tartrate (LOPRESSOR) 50 MG tablet TAKE 1 TABLET BY MOUTH TWICE A DAY (Patient taking differently: Take 50 mg by mouth 2 (two) times daily.)   morphine (MS CONTIN) 15 MG 12 hr tablet Take 1 tablet (15 mg total) by mouth every 12 (twelve) hours.   Multiple Vitamins-Minerals (MULTIVITAMIN WITH MINERALS) tablet Take 1 tablet by mouth daily.   omeprazole (PRILOSEC) 40 MG capsule TAKE 1 CAPSULE BY MOUTH EVERY DAY (Patient taking differently: Take 40 mg by mouth daily.)   polyethylene glycol powder (GLYCOLAX/MIRALAX) 17 GM/SCOOP powder Take 17 g by mouth as needed for mild constipation.   prazosin (MINIPRESS) 2 MG capsule Take 2 mg by mouth at bedtime.   Probiotic Product (PROBIOTIC DAILY PO) Take 1 capsule by mouth daily.   prochlorperazine (COMPAZINE) 10 MG tablet TAKE ONE TABLET BY MOUTH TWICE A DAY AS NEEDED FOR NAUSEA OR VOMITING (Patient taking differently: Take 10 mg by mouth 2 (two) times daily as needed for nausea or vomiting. TAKE ONE TABLET BY MOUTH TWICE A DAY AS NEEDED FOR NAUSEA OR VOMITING)   ramipril (ALTACE) 10 MG capsule TAKE 1 CAPSULE BY MOUTH EVERY DAY (Patient taking  differently: Take 10 mg by mouth daily. TAKE 1 CAPSULE BY MOUTH EVERY DAY)   senna-docusate (SENOKOT-S) 8.6-50 MG tablet Take 2 tablets by mouth at bedtime. For AFTER surgery, do not take if having diarrhea   simethicone (MYLICON) 80 MG chewable tablet Chew 160 mg by mouth daily as needed (gas).   tiZANidine (ZANAFLEX) 2 MG tablet Take 2 mg by mouth at bedtime.   traZODone (DESYREL) 100 MG tablet Take 3 tablets (300 mg total) by mouth at bedtime.   vitamin B-12 (CYANOCOBALAMIN) 1000 MCG tablet Take 1,000 mcg by mouth daily.   No facility-administered encounter medications on file as of 05/17/2021.    Patient Active Problem List   Diagnosis Date Noted   No impairment of memory 04/29/2021   Polypharmacy 04/29/2021   Hearing impairment 04/28/2021   Memory difficulties 03/04/2021   Insomnia 85/92/9244   Monoallelic mutation of BRIP1 gene 01/17/2021   Arteriosclerosis of abdominal aorta ([place]) 12/02/2020   Urinary incontinence, mixed 04/02/2020   Diabetes mellitus type 2 in obese ([place]) 06/30/2019  Type 2 diabetes mellitus without complications ([place]) 53/66/4403   Spinal stenosis of lumbar region 06/21/2016   Encounter for chronic pain management 47/42/5956   Metabolic syndrome 38/75/6433   Hepatic steatosis 04/12/2011    Class: Chronic   Periodic limb movements of sleep 12/05/2010   Vitamin D deficiency 03/03/2010   HYPERCHOLESTEROLEMIA 12/24/2008   Chronic pain syndrome 12/24/2008   Functional gastrointestinal disorder 06/18/2008   SIGMOID POLYP 01/23/2008   HYPERTRIGLYCERIDEMIA 11/11/2007   Obesity, Class III, BMI 40-49.9 (morbid obesity) ([place]) 09/30/2007   RESTLESS LEGS SYNDROME 09/11/2007   Fibromyalgia syndrome 07/15/2007   Degenerative joint disease of both hips 05/23/2007   Hypothyroidism 02/15/2007   Bipolar 2 disorder ([place]) 01/03/2007   Hypertension associated with diabetes ([place]) 01/03/2007   Allergic rhinitis 01/03/2007   GERD without esophagitis 01/03/2007   Irritable  bowel syndrome 01/03/2007   Osteoarthritis of spine 01/03/2007    Conditions to be addressed/monitored: Anxiety; Mental Health Concerns   Care Plan : General Social Work (Adult)  Updates made by Maurine Cane, LCSW since 05/17/2021 12:00 AM   Problem: No Advance Directive    Goal: Effective Long-Term Care Planning/ Advance Directive Completed 05/17/2021  Start Date: 04/13/2021  Expected End Date: 05/05/2021  This Visit's Progress: On track  Recent Progress: On track  Priority: High  Note:   Current barriers:  Goal completed Patient now has an advance directive.  Clinical Goal(s): Over the next 30 days, the patient will review information on advance directive, complete advance directive packet and have notarized.  Interventions: Inter-disciplinary care team collaboration (see longitudinal plan of care) Assessed understanding of Advance Directives. Assisted patient with completing document Patient has reviewed the EMMI video and thought of who she would like to serve as her healthcare proxy  Currently has some concern with discussing end of life and advance care planning. A voluntary discussion about advanced care planning including importance of advanced directives, healthcare proxy and living will was discussed with the patient.  Educational information on Advance Directives as well as an advance directive packet provided.  Patient Goals/Self-Care Activities :  Congratulations on completing your advance directive and having it notarized       Casimer Lanius, [name]   [phone] 2:25 PM

## 2021-05-17 NOTE — Patient Instructions (Signed)
Visit Information   Goals Addressed             This Visit's Progress    COMPLETED: Effective Long-Term Care Planning       Patient Goals/Self-Care Activities :  Congratulations on completing your advance directive and having it notarized       Patient verbalizes understanding of instructions provided today and agrees to view in [place].   No further follow up required: by LCSW at this time.   Will remain connected to care team for the next 60 days.  Will disconnect if no needs are identified.  Casimer Lanius, LCSW Care Management & Coordination  [phone]

## 2021-05-18 ENCOUNTER — Telehealth: Payer: Self-pay

## 2021-05-18 DIAGNOSIS — F3181 Bipolar II disorder: Secondary | ICD-10-CM | POA: Diagnosis not present

## 2021-05-18 NOTE — Anesthesia Preprocedure Evaluation (Addendum)
Anesthesia Evaluation  Patient identified by MRN, date of birth, ID band Patient awake    Reviewed: Allergy & Precautions, NPO status , Patient's Chart, lab work & pertinent test results  Airway Mallampati: II  TM Distance: >3 FB Neck ROM: Full    Dental no notable dental hx. (+) Edentulous Upper, Poor Dentition, Dental Advisory Given,    Pulmonary COPD, former smoker,  Quit smoking 2009, 66 pack year history    Pulmonary exam normal breath sounds clear to auscultation       Cardiovascular hypertension, Pt. on medications pulmonary hypertension (mild pHTN)+ CAD and +CHF (grade 1 diastolic dysfunction)  Normal cardiovascular exam Rhythm:Regular Rate:Normal  Echo 2011: - Left ventricle: There was mild focal basal hypertrophy of the   septum. Systolic function was normal. The estimated ejection   fraction was in the range of 60% to 65%. Although no diagnostic   regional wall motion abnormality was identified, this possibility   cannot be completely excluded on the basis of this study. Doppler   parameters are consistent with abnormal left ventricular   relaxation (grade 1 diastolic dysfunction).  - Pulmonary arteries: Systolic pressure was mildly increased.    Neuro/Psych PSYCHIATRIC DISORDERS Anxiety Depression Bipolar Disorder negative neurological ROS     GI/Hepatic negative GI ROS, (+)     substance abuse  marijuana use,   Endo/Other  diabetesMorbid obesityBMI 42  Renal/GU negative Renal ROS  negative genitourinary   Musculoskeletal  (+) Arthritis , Osteoarthritis,  Fibromyalgia -  Abdominal (+) + obese,   Peds  Hematology negative hematology ROS (+) hct 44   Anesthesia Other Findings   Reproductive/Obstetrics negative OB ROS                            Anesthesia Physical Anesthesia Plan  ASA: 3  Anesthesia Plan: General   Post-op Pain Management:    Induction:  Intravenous  PONV Risk Score and Plan: 4 or greater and Ondansetron, Dexamethasone, Midazolam, Treatment may vary due to age or medical condition and Aprepitant  Airway Management Planned: Oral ETT  Additional Equipment: None  Intra-op Plan:   Post-operative Plan: Extubation in OR  Informed Consent: I have reviewed the patients History and Physical, chart, labs and discussed the procedure including the risks, benefits and alternatives for the proposed anesthesia with the patient or authorized representative who has indicated his/her understanding and acceptance.     Dental advisory given  Plan Discussed with: CRNA and Anesthesiologist  Anesthesia Plan Comments:        Anesthesia Quick Evaluation

## 2021-05-18 NOTE — Telephone Encounter (Signed)
[name] states that she understands her written pre-op instructions from 04-28-21. She does not have any questions or concerns at this time.

## 2021-05-19 ENCOUNTER — Encounter (HOSPITAL_COMMUNITY): Payer: Self-pay | Admitting: Gynecologic Oncology

## 2021-05-19 ENCOUNTER — Ambulatory Visit (HOSPITAL_COMMUNITY): Payer: PPO | Admitting: Certified Registered Nurse Anesthetist

## 2021-05-19 ENCOUNTER — Ambulatory Visit (HOSPITAL_COMMUNITY)
Admission: RE | Admit: 2021-05-19 | Discharge: 2021-05-19 | Disposition: A | Discharge status: Home / Self Care | Payer: PPO | Attending: Gynecologic Oncology | Admitting: Gynecologic Oncology

## 2021-05-19 ENCOUNTER — Ambulatory Visit (HOSPITAL_COMMUNITY): Payer: PPO | Admitting: Emergency Medicine

## 2021-05-19 ENCOUNTER — Encounter (HOSPITAL_COMMUNITY)
Admission: RE | Disposition: A | Discharge status: Home / Self Care | Payer: Self-pay | Source: Home / Self Care | Attending: Gynecologic Oncology

## 2021-05-19 DIAGNOSIS — N838 Other noninflammatory disorders of ovary, fallopian tube and broad ligament: Secondary | ICD-10-CM | POA: Insufficient documentation

## 2021-05-19 DIAGNOSIS — M797 Fibromyalgia: Secondary | ICD-10-CM | POA: Insufficient documentation

## 2021-05-19 DIAGNOSIS — K660 Peritoneal adhesions (postprocedural) (postinfection): Secondary | ICD-10-CM | POA: Insufficient documentation

## 2021-05-19 DIAGNOSIS — L821 Other seborrheic keratosis: Secondary | ICD-10-CM | POA: Diagnosis not present

## 2021-05-19 DIAGNOSIS — Z79890 Hormone replacement therapy (postmenopausal): Secondary | ICD-10-CM | POA: Diagnosis not present

## 2021-05-19 DIAGNOSIS — Z8614 Personal history of Methicillin resistant Staphylococcus aureus infection: Secondary | ICD-10-CM | POA: Diagnosis not present

## 2021-05-19 DIAGNOSIS — Z90721 Acquired absence of ovaries, unilateral: Secondary | ICD-10-CM | POA: Diagnosis not present

## 2021-05-19 DIAGNOSIS — Z79899 Other long term (current) drug therapy: Secondary | ICD-10-CM | POA: Diagnosis not present

## 2021-05-19 DIAGNOSIS — Z833 Family history of diabetes mellitus: Secondary | ICD-10-CM | POA: Insufficient documentation

## 2021-05-19 DIAGNOSIS — N736 Female pelvic peritoneal adhesions (postinfective): Secondary | ICD-10-CM | POA: Diagnosis not present

## 2021-05-19 DIAGNOSIS — G2581 Restless legs syndrome: Secondary | ICD-10-CM | POA: Insufficient documentation

## 2021-05-19 DIAGNOSIS — Z1501 Genetic susceptibility to malignant neoplasm of breast: Secondary | ICD-10-CM

## 2021-05-19 DIAGNOSIS — Z888 Allergy status to other drugs, medicaments and biological substances status: Secondary | ICD-10-CM | POA: Insufficient documentation

## 2021-05-19 DIAGNOSIS — E559 Vitamin D deficiency, unspecified: Secondary | ICD-10-CM | POA: Diagnosis not present

## 2021-05-19 DIAGNOSIS — Z148 Genetic carrier of other disease: Secondary | ICD-10-CM

## 2021-05-19 DIAGNOSIS — Z886 Allergy status to analgesic agent status: Secondary | ICD-10-CM | POA: Insufficient documentation

## 2021-05-19 DIAGNOSIS — Z1509 Genetic susceptibility to other malignant neoplasm: Secondary | ICD-10-CM

## 2021-05-19 DIAGNOSIS — Z1502 Genetic susceptibility to malignant neoplasm of ovary: Secondary | ICD-10-CM

## 2021-05-19 DIAGNOSIS — Z1589 Genetic susceptibility to other disease: Secondary | ICD-10-CM

## 2021-05-19 DIAGNOSIS — E1136 Type 2 diabetes mellitus with diabetic cataract: Secondary | ICD-10-CM | POA: Diagnosis not present

## 2021-05-19 DIAGNOSIS — E7800 Pure hypercholesterolemia, unspecified: Secondary | ICD-10-CM | POA: Diagnosis not present

## 2021-05-19 HISTORY — PX: ROBOTIC ASSISTED SALPINGO OOPHERECTOMY: SHX6082

## 2021-05-19 HISTORY — PX: LAPAROSCOPIC LYSIS OF ADHESIONS: SHX5905

## 2021-05-19 LAB — GLUCOSE, CAPILLARY
Glucose-Capillary: 139 mg/dL — ABNORMAL HIGH (ref 70–99)
Glucose-Capillary: 209 mg/dL — ABNORMAL HIGH (ref 70–99)

## 2021-05-19 LAB — TYPE AND SCREEN
ABO/RH(D): O POS
Antibody Screen: NEGATIVE

## 2021-05-19 SURGERY — SALPINGO-OOPHORECTOMY, ROBOT-ASSISTED
Anesthesia: General | Site: Pelvis

## 2021-05-19 MED ORDER — BUPIVACAINE HCL 0.25 % IJ SOLN
INTRAMUSCULAR | Status: DC | PRN
  Administered 2021-05-19: 19 mL

## 2021-05-19 MED ORDER — LACTATED RINGERS IR SOLN
Status: DC | PRN
  Administered 2021-05-19: 1000 mL

## 2021-05-19 MED ORDER — PROPOFOL 10 MG/ML IV BOLUS
INTRAVENOUS | Status: AC
  Filled 2021-05-19: qty 20

## 2021-05-19 MED ORDER — ORAL CARE MOUTH RINSE: 15.0000 mL | Freq: Once | OROMUCOSAL | Status: AC

## 2021-05-19 MED ORDER — HYDROMORPHONE HCL 1 MG/ML IJ SOLN
0.2500 mg | INTRAMUSCULAR | Status: DC | PRN
  Administered 2021-05-19: 0.5 mg via INTRAVENOUS
  Administered 2021-05-19 (×2): 0.25 mg via INTRAVENOUS

## 2021-05-19 MED ORDER — PROPOFOL 10 MG/ML IV BOLUS
INTRAVENOUS | Status: DC | PRN
  Administered 2021-05-19: 150 mg via INTRAVENOUS

## 2021-05-19 MED ORDER — CHLORHEXIDINE GLUCONATE 0.12 % MT SOLN
15.0000 mL | Freq: Once | OROMUCOSAL | Status: AC
  Administered 2021-05-19: 15 mL via OROMUCOSAL

## 2021-05-19 MED ORDER — SODIUM CHLORIDE 0.9% FLUSH
3.0000 mL | INTRAVENOUS | Status: DC | PRN
Start: 2021-05-19 — End: 2021-05-19

## 2021-05-19 MED ORDER — PROMETHAZINE HCL 25 MG/ML IJ SOLN: 6.2500 mg | INTRAMUSCULAR | Status: DC | PRN

## 2021-05-19 MED ORDER — PHENYLEPHRINE 40 MCG/ML (10ML) SYRINGE FOR IV PUSH (FOR BLOOD PRESSURE SUPPORT)
PREFILLED_SYRINGE | INTRAVENOUS | Status: AC
  Filled 2021-05-19: qty 10

## 2021-05-19 MED ORDER — APREPITANT 40 MG PO CAPS
40.0000 mg | ORAL_CAPSULE | Freq: Once | ORAL | Status: AC
  Administered 2021-05-19: 40 mg via ORAL
  Filled 2021-05-19: qty 1

## 2021-05-19 MED ORDER — SODIUM CHLORIDE 0.9 % IV SOLN
250.0000 mL | INTRAVENOUS | Status: DC | PRN
Start: 2021-05-19 — End: 2021-05-19

## 2021-05-19 MED ORDER — EPHEDRINE 5 MG/ML INJ
INTRAVENOUS | Status: AC
  Filled 2021-05-19: qty 10

## 2021-05-19 MED ORDER — DEXAMETHASONE SODIUM PHOSPHATE 4 MG/ML IJ SOLN
INTRAMUSCULAR | Status: DC | PRN
  Administered 2021-05-19: 5 mg via INTRAVENOUS

## 2021-05-19 MED ORDER — OXYCODONE HCL 5 MG PO TABS
ORAL_TABLET | ORAL | Status: AC
  Filled 2021-05-19: qty 1

## 2021-05-19 MED ORDER — MIDAZOLAM HCL 5 MG/5ML IJ SOLN
INTRAMUSCULAR | Status: DC | PRN
  Administered 2021-05-19: 2 mg via INTRAVENOUS

## 2021-05-19 MED ORDER — HYDROMORPHONE HCL 1 MG/ML IJ SOLN
INTRAMUSCULAR | Status: AC
  Filled 2021-05-19: qty 1

## 2021-05-19 MED ORDER — BUPIVACAINE HCL (PF) 0.5 % IJ SOLN
INTRAMUSCULAR | Status: AC
  Filled 2021-05-19: qty 30

## 2021-05-19 MED ORDER — SUGAMMADEX SODIUM 200 MG/2ML IV SOLN
INTRAVENOUS | Status: DC | PRN
  Administered 2021-05-19: 200 mg via INTRAVENOUS

## 2021-05-19 MED ORDER — ACETAMINOPHEN 500 MG PO TABS: 1000.0000 mg | ORAL_TABLET | Freq: Once | ORAL | Status: DC

## 2021-05-19 MED ORDER — ACETAMINOPHEN 325 MG PO TABS
650.0000 mg | ORAL_TABLET | ORAL | Status: DC | PRN
Start: 2021-05-19 — End: 2021-05-19

## 2021-05-19 MED ORDER — ROCURONIUM BROMIDE 10 MG/ML (PF) SYRINGE
PREFILLED_SYRINGE | INTRAVENOUS | Status: DC | PRN
  Administered 2021-05-19: 60 mg via INTRAVENOUS
  Administered 2021-05-19: 20 mg via INTRAVENOUS

## 2021-05-19 MED ORDER — LIDOCAINE 2% (20 MG/ML) 5 ML SYRINGE
INTRAMUSCULAR | Status: DC | PRN
  Administered 2021-05-19: 60 mg via INTRAVENOUS

## 2021-05-19 MED ORDER — OXYCODONE HCL 5 MG PO TABS
5.0000 mg | ORAL_TABLET | ORAL | Status: DC | PRN
Start: 2021-05-19 — End: 2021-05-19

## 2021-05-19 MED ORDER — PHENYLEPHRINE 40 MCG/ML (10ML) SYRINGE FOR IV PUSH (FOR BLOOD PRESSURE SUPPORT)
PREFILLED_SYRINGE | INTRAVENOUS | Status: DC | PRN
  Administered 2021-05-19: 80 ug via INTRAVENOUS

## 2021-05-19 MED ORDER — OXYCODONE HCL 5 MG PO TABS
5.0000 mg | ORAL_TABLET | Freq: Once | ORAL | Status: AC | PRN
Start: 2021-05-19 — End: 2021-05-19
  Administered 2021-05-19: 5 mg via ORAL

## 2021-05-19 MED ORDER — MORPHINE SULFATE (PF) 4 MG/ML IV SOLN: 2.0000 mg | INTRAVENOUS | Status: DC | PRN

## 2021-05-19 MED ORDER — HEPARIN SODIUM (PORCINE) 5000 UNIT/ML IJ SOLN
5000.0000 [IU] | INTRAMUSCULAR | Status: AC
  Administered 2021-05-19: 5000 [IU] via SUBCUTANEOUS
  Filled 2021-05-19: qty 1

## 2021-05-19 MED ORDER — ACETAMINOPHEN 500 MG PO TABS
1000.0000 mg | ORAL_TABLET | ORAL | Status: AC
Start: 2021-05-19 — End: 2021-05-19
  Administered 2021-05-19: 1000 mg via ORAL
  Filled 2021-05-19: qty 2

## 2021-05-19 MED ORDER — LABETALOL HCL 5 MG/ML IV SOLN
INTRAVENOUS | Status: AC
  Filled 2021-05-19: qty 4

## 2021-05-19 MED ORDER — BUPIVACAINE HCL (PF) 0.25 % IJ SOLN
INTRAMUSCULAR | Status: AC
  Filled 2021-05-19: qty 30

## 2021-05-19 MED ORDER — ONDANSETRON HCL 4 MG/2ML IJ SOLN
INTRAMUSCULAR | Status: DC | PRN
  Administered 2021-05-19: 4 mg via INTRAVENOUS

## 2021-05-19 MED ORDER — MIDAZOLAM HCL 2 MG/2ML IJ SOLN
INTRAMUSCULAR | Status: AC
  Filled 2021-05-19: qty 2

## 2021-05-19 MED ORDER — FENTANYL CITRATE (PF) 250 MCG/5ML IJ SOLN
INTRAMUSCULAR | Status: AC
  Filled 2021-05-19: qty 5

## 2021-05-19 MED ORDER — OXYCODONE HCL 5 MG/5ML PO SOLN
5.0000 mg | Freq: Once | ORAL | Status: AC | PRN
Start: 2021-05-19 — End: 2021-05-19

## 2021-05-19 MED ORDER — SODIUM CHLORIDE 0.9% FLUSH: 3.0000 mL | Freq: Two times a day (BID) | INTRAVENOUS | Status: DC

## 2021-05-19 MED ORDER — LACTATED RINGERS IV SOLN: INTRAVENOUS | Status: DC

## 2021-05-19 MED ORDER — STERILE WATER FOR IRRIGATION IR SOLN
Status: DC | PRN
  Administered 2021-05-19: 1000 mL

## 2021-05-19 MED ORDER — ACETAMINOPHEN 650 MG RE SUPP
650.0000 mg | RECTAL | Status: DC | PRN
  Filled 2021-05-19: qty 1

## 2021-05-19 MED ORDER — FENTANYL CITRATE (PF) 100 MCG/2ML IJ SOLN
INTRAMUSCULAR | Status: DC | PRN
  Administered 2021-05-19: 100 ug via INTRAVENOUS
  Administered 2021-05-19 (×2): 50 ug via INTRAVENOUS

## 2021-05-19 MED ORDER — STERILE WATER FOR INJECTION IJ SOLN
INTRAMUSCULAR | Status: AC
  Filled 2021-05-19: qty 10

## 2021-05-19 SURGICAL SUPPLY — 74 items
ADH SKN CLS APL DERMABOND .7 (GAUZE/BANDAGES/DRESSINGS) ×3
AGENT HMST KT MTR STRL THRMB (HEMOSTASIS)
APL ESCP 34 STRL LF DISP (HEMOSTASIS)
APPLICATOR SURGIFLO ENDO (HEMOSTASIS) IMPLANT
BACTOSHIELD CHG 4% 4OZ (MISCELLANEOUS) ×1
BAG COUNTER SPONGE SURGICOUNT (BAG) IMPLANT
BAG LAPAROSCOPIC 12 15 PORT 16 (BASKET) IMPLANT
BAG RETRIEVAL 12/15 (BASKET)
BAG SPEC RTRVL LRG 6X4 10 (ENDOMECHANICALS) ×3
BAG SPNG CNTER NS LX DISP (BAG)
BLADE SURG SZ10 CARB STEEL (BLADE) IMPLANT
COVER BACK TABLE 60X90IN (DRAPES) ×4 IMPLANT
COVER TIP SHEARS 8 DVNC (MISCELLANEOUS) ×3 IMPLANT
COVER TIP SHEARS 8MM DA VINCI (MISCELLANEOUS) ×4
DECANTER SPIKE VIAL GLASS SM (MISCELLANEOUS) IMPLANT
DERMABOND ADVANCED (GAUZE/BANDAGES/DRESSINGS) ×1
DERMABOND ADVANCED .7 DNX12 (GAUZE/BANDAGES/DRESSINGS) ×3 IMPLANT
DRAPE ARM DVNC X/XI (DISPOSABLE) ×12 IMPLANT
DRAPE COLUMN DVNC XI (DISPOSABLE) ×3 IMPLANT
DRAPE DA VINCI XI ARM (DISPOSABLE) ×16
DRAPE DA VINCI XI COLUMN (DISPOSABLE) ×4
DRAPE SHEET LG 3/4 BI-LAMINATE (DRAPES) ×4 IMPLANT
DRAPE SURG IRRIG POUCH 19X23 (DRAPES) ×4 IMPLANT
DRSG OPSITE POSTOP 4X6 (GAUZE/BANDAGES/DRESSINGS) IMPLANT
DRSG OPSITE POSTOP 4X8 (GAUZE/BANDAGES/DRESSINGS) IMPLANT
DRSG TEGADERM 2-3/8X2-3/4 SM (GAUZE/BANDAGES/DRESSINGS) ×2 IMPLANT
ELECT PENCIL ROCKER SW 15FT (MISCELLANEOUS) IMPLANT
ELECT REM PT RETURN 15FT ADLT (MISCELLANEOUS) ×4 IMPLANT
GAUZE SPONGE 2X2 8PLY STRL LF (GAUZE/BANDAGES/DRESSINGS) ×1 IMPLANT
GLOVE SURG ENC MOIS LTX SZ6 (GLOVE) ×16 IMPLANT
GLOVE SURG ENC MOIS LTX SZ6.5 (GLOVE) ×8 IMPLANT
GOWN STRL REUS W/ TWL LRG LVL3 (GOWN DISPOSABLE) ×12 IMPLANT
GOWN STRL REUS W/TWL LRG LVL3 (GOWN DISPOSABLE) ×16
HOLDER FOLEY CATH W/STRAP (MISCELLANEOUS) ×4 IMPLANT
IRRIG SUCT STRYKERFLOW 2 WTIP (MISCELLANEOUS) ×4
IRRIGATION SUCT STRKRFLW 2 WTP (MISCELLANEOUS) ×3 IMPLANT
KIT PROCEDURE DA VINCI SI (MISCELLANEOUS)
KIT PROCEDURE DVNC SI (MISCELLANEOUS) IMPLANT
KIT TURNOVER KIT A (KITS) ×4 IMPLANT
MANIPULATOR UTERINE 4.5 ZUMI (MISCELLANEOUS) ×4 IMPLANT
NDL HYPO 21X1.5 SAFETY (NEEDLE) ×2 IMPLANT
NDL SPNL 18GX3.5 QUINCKE PK (NEEDLE) IMPLANT
NEEDLE HYPO 21X1.5 SAFETY (NEEDLE) ×4 IMPLANT
NEEDLE SPNL 18GX3.5 QUINCKE PK (NEEDLE) IMPLANT
OBTURATOR OPTICAL STANDARD 8MM (TROCAR) ×4
OBTURATOR OPTICAL STND 8 DVNC (TROCAR) ×3
OBTURATOR OPTICALSTD 8 DVNC (TROCAR) ×3 IMPLANT
PACK ROBOT GYN CUSTOM WL (TRAY / TRAY PROCEDURE) ×4 IMPLANT
PAD POSITIONING PINK XL (MISCELLANEOUS) ×4 IMPLANT
PORT ACCESS TROCAR AIRSEAL 12 (TROCAR) ×3 IMPLANT
PORT ACCESS TROCAR AIRSEAL 5M (TROCAR) ×1
POUCH SPECIMEN RETRIEVAL 10MM (ENDOMECHANICALS) ×2 IMPLANT
SCRUB CHG 4% DYNA-HEX 4OZ (MISCELLANEOUS) ×3 IMPLANT
SEAL CANN UNIV 5-8 DVNC XI (MISCELLANEOUS) ×12 IMPLANT
SEAL XI 5MM-8MM UNIVERSAL (MISCELLANEOUS) ×16
SET TRI-LUMEN FLTR TB AIRSEAL (TUBING) ×4 IMPLANT
SPONGE GAUZE 2X2 STER 10/PKG (GAUZE/BANDAGES/DRESSINGS) ×1
SPONGE T-LAP 18X18 ~~LOC~~+RFID (SPONGE) IMPLANT
SURGIFLO W/THROMBIN 8M KIT (HEMOSTASIS) IMPLANT
SUT MNCRL AB 4-0 PS2 18 (SUTURE) IMPLANT
SUT PDS AB 1 TP1 96 (SUTURE) IMPLANT
SUT VIC AB 0 CT1 27 (SUTURE)
SUT VIC AB 0 CT1 27XBRD ANTBC (SUTURE) IMPLANT
SUT VIC AB 2-0 CT1 27 (SUTURE)
SUT VIC AB 2-0 CT1 TAPERPNT 27 (SUTURE) IMPLANT
SUT VIC AB 4-0 PS2 18 (SUTURE) ×8 IMPLANT
SYR 10ML LL (SYRINGE) IMPLANT
TOWEL OR NON WOVEN STRL DISP B (DISPOSABLE) ×4 IMPLANT
TRAP SPECIMEN MUCUS 40CC (MISCELLANEOUS) IMPLANT
TRAY FOLEY MTR SLVR 16FR STAT (SET/KITS/TRAYS/PACK) ×4 IMPLANT
TROCAR XCEL NON-BLD 5MMX100MML (ENDOMECHANICALS) IMPLANT
UNDERPAD 30X36 HEAVY ABSORB (UNDERPADS AND DIAPERS) ×4 IMPLANT
WATER STERILE IRR 1000ML POUR (IV SOLUTION) ×4 IMPLANT
YANKAUER SUCT BULB TIP 10FT TU (MISCELLANEOUS) IMPLANT

## 2021-05-19 NOTE — H&P (Signed)
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   Plan for risk-reducing USO vs BSO, possible staging if malignancy found intra-op, any other indicated procedures.   I reviewed genetic testing and implications in terms of cancer risk. With regard to gynecologic malignancy, a mutation in BRIP1 appears to carry up to an approximately 10% lifetime risk of ovarian cancer.  The National Comprehensive Cancer Network recommends consideration of risk reducing surgery with bilateral salpingo-oophorectomy starting at the age of 34-50.   Given the patient's age (70) and likely unilateral salpingo-oophorectomy, I would guess that her risk of ovarian/fallopian tube cancer is less than the lifetime risk noted in BRIP1 mutation carries. That being said, it is likely still elevated above the population baseline risk.  Jeral Pinch, MD  ___________________________________________  Chief Complaint: No chief complaint on file.   History of Present Illness:  [name] is a 70 y.o. y.o. female with a BRIP1 mutation.   The patient reports that her daughter, recently diagnosed with both early stage of vulvar as well as breast cancer, tested positive for a BRIP1 mutation on genetic testing.  Subsequently, the patient had tested herself and was found to be positive for a pathogenic mutation.  He has multiple family members with gynecologic cancer and including her paternal great grandmother and her niece, who is deceased from ovarian cancer.  Patient also notes that there is a history of breast cancer on her father side of the family.  Another the patient's daughters has been tested and is negative for a mutation in [place].  Patient's sister and another niece are currently undergoing testing.   Patient endorses having a good appetite although seems to be hungry only at night.  She has struggled with significant depression and many years ago  had multiple suicide attempts.  She notes being in bed a lot secondary to her depression and anxiety.  She denies any nausea or emesis.  She reports regular bowel and bladder function.  She has some irritable bowel symptoms including intermittent constipation and diarrhea.   Patient went through menopause at approximately age 17.  She had a hysterectomy in 1982 although does not remember the reason for this.  She had 1 ovary removed at that time.  Her surgical history is also notable for an ectopic pregnancy requiring surgery as well as an ovarian cyst removal when she was young that she describes as being "attached to her intestines".   Her medical history is notable for significant depression and bipolar disorder, chronic pain (patient has joint/back pain as well as fibromyalgia) -she takes a narcotic at night for restless leg syndrome and opiates during the day for her chronic pain, type 2 diabetes mellitus (has continuous glucose monitor, diet controlled, last HgbA1c on 12/02/2020 6.2%).   Patient lives in [place] with her daughter.  She is retired (on disability) after working for [employer] for 25 years.  She has a history of tobacco use but quit in 2009.  She denies any alcohol use but endorses some THC use in the evenings.   [facility]: Genetic testing 12/21/2020 showed a pathogenic variant in the BRIP1 gene.  PAST MEDICAL HISTORY:  Past Medical History:  Diagnosis Date   Adrenal adenoma, left 12/02/2020   CT AP 11/16/20:  left adrenal mass measuring approximately 2.7 cm. This is minimally increased in size from prior study and is consistent with a benign adrenal adenoma.    Adrenal nodule ([place]) 04/08/2011  04/08/11 Abdominal CT showed Left adrenal nodule which is tachnically indeterminate but most likely an adenoma.  It is 1.7 cm and 42 HU on portal venous phase image. 31 HU on delayed images.   01/09/2012 Abdominal CT w/ & w/o CM: Stable benign left adrenal adenoma. No further specific  follow-up is needed for this finding.     Allergic rhinitis 01/03/2007   Qualifier: Diagnosis of  By: Drucie Ip     Allergy    Anemia    hx of  in 20's   Anxiety    Bipolar disorder ([place])    Carpal tunnel syndrome 02/14/2007   S/P carpel tunnel release bilaterally.     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 03/28/2018 03/28/2018   COPD WITHOUT EXACERBATION 01/03/2007   Qualifier: Diagnosis of  By: Drucie Ip  emphysema   Coronary artery calcification seen on CAT scan 12/03/2017   11/2017 Chest CT: Left anterior descending and right coronary atherosclerosis   Diabetes mellitus without complication (HCC)    Y8M 6.5 as of 05-2019- diet changes, no meds currently    DISC WITH RADICULOPATHY 01/03/2007   Qualifier: Diagnosis of  By: Drucie Ip     Emphysema of lung ([place])    Esophageal reflux 01/03/2007   Centricity Description: GASTROESOPHAGEAL REFLUX, NO ESOPHAGITIS Qualifier: Diagnosis of  By: Drucie Ip   Centricity Description: GERD Qualifier: Diagnosis of  By: Tye Savoy MD, [place]     External hemorrhoids s/p hemorrhoidectomy 03/28/2018 03/28/2018   Family history of breast cancer    Family history of cancer of female genital organ    Family history of colon cancer    Family history of gene mutation    BRIP1 gene mutation ([id number].[id number]) in daughter   Family history of lung cancer    Fibromyalgia    Fracture of bone spur of inferior portion of calcaneus 10/06/2018   Functional gastrointestinal disorder 06/18/2008   Functional Gastrointestinal Disorder with frequent eructation and bloating sensation with acute flares.  Responsive to Compazine and Bentyl.  Takes a few days to calm down.     GERD without esophagitis 01/03/2007   Centricity Description: GASTROESOPHAGEAL REFLUX, NO ESOPHAGITIS Qualifier: Diagnosis of  By: Drucie Ip   Centricity Description: GERD  Qualifier: Diagnosis of  By: Tye Savoy MD, [place]     Hearing impairment 04/28/2021   Heel spur, fracture, left 04/17/2018   HEMORRHOIDS, NOS 01/03/2007   Qualifier: Diagnosis of  By: Drucie Ip     Hepatic steatosis 04/12/2011   Herpes simplex labialis 05/11/2011   Hip osteoarthritis 05/23/2007   MRI Pelvis (03/13/07) Bilateral osteoarthritis of the hips, left greater than right.  The  patient has a slightly more prominent hip effusion on the left than  the right.  No other significant abnormality.     History of MRSA infection 04/28/2011   HYPERCHOLESTEROLEMIA 12/24/2008   Hypertension    HYPERTENSION, BENIGN SYSTEMIC 01/03/2007   Qualifier: Diagnosis of  By: Drucie Ip     HYPERTRIGLYCERIDEMIA 11/11/2007   Qualifier: Diagnosis of  By: McDiarmid MD, Todd     Hypokalemia 12/12/2017   Hypothyroidism 02/15/2007   Qualifier: Diagnosis of  By: McDiarmid MD, Jannifer Franklin, BORDERLINE 02/15/2007   Irritable bowel syndrome 01/03/2007   Qualifier: Diagnosis of  By: Drucie Ip     LACTOSE INTOLERANCE 03/10/2008   Major depressive disorder, recurrent episode ([place]) 01/03/2007  Qualifier: Diagnosis of  By: Drucie Ip     Medication management 12/14/2016   Managing topiramate for weight loss starting 12/14/16   Obesity (BMI 30.0-34.9) 09/30/2007   Has lost 55 lbs since easter. Goal of <190 by 08/05/12.      Obesity, Class III, BMI 40-49.9 (morbid obesity) ([place]) 09/30/2007        Osteoarthritis of spine 01/03/2007   MR I Lumbar (08/03): GeneralDDD; L2-3 Large  HNP.  Mod pos  hnp - 12/14/2005, smhnpw/irritL4root - 12/14/2005     OSTEOARTHRITIS, HANDS, BILATERAL 06/18/2008   Qualifier: Diagnosis of  By: McDiarmid MD, Kristeen Miss FEMORAL STRESS SYNDROME 01/03/2007   Qualifier: Diagnosis of  By: Drucie Ip     Periodic limb movements of sleep 12/05/2010   Diagnosed on Polysomnography testing Fall 2011.      Pneumonia    walking pneumonia in the 90's   Pre-diabetes 12/25/2008   Qualifier:  Diagnosis of  By: McDiarmid MD, Todd     Prolapsed internal hemorrhoids, grade 3, s/p ligation/pexy/hemorrhoidectomy 03/28/2018 03/28/2018   RESTLESS LEGS SYNDROME 09/11/2007   Polysomnography (10/2010, Dr Keturah Barre, interpreter. ) showed periodic limb movements that frequently awoke patient from sleep with arousal index of 4 per hour. Dr Danton Sewer (Sleep Specialist) thought her RLS was the major contributor to patient poor sleep condition, more than any sleep apnea.  He recommended considering opamine agonist (either requip or mirapex) to see if this will help.      Rosacea, acne 10/24/2011   Solitary lung nodule 04/08/2011   Stage 1 mild COPD by GOLD classification ([place]) 01/03/2007   11/28/2017 Chest CT: Mild centrilobular and paraseptal emphysema with mild diffuse bronchial wall thickening     Stress incontinence    Synovial cyst of lumbar facet joint 02/17/2016   Trigger thumb of left hand 08/16/2018   Urticaria, chronic 05/22/2011   VITAMIN D DEFICIENCY 03/03/2010   Work-related condition 05/17/2018     PAST SURGICAL HISTORY:  Past Surgical History:  Procedure Laterality Date   APPENDECTOMY     bladder tack  1998   Dr Jeffie Pollock (urology)   BUNIONECTOMY  1992   bilateral feet with pins in great toes   [place]  2010   Bilateral, Dr Herbie Baltimore Sypher   COLONOSCOPY  2004   Dr Verdia Kuba (GI)   CYSTOCELE REPAIR  1998    Dr Gertie Fey (GYN)-   ESOPHAGOGASTRODUODENOSCOPY     EVALUATION UNDER ANESTHESIA WITH HEMORRHOIDECTOMY N/A 03/28/2018   Procedure: ANORECTAL EXAM UNDER ANESTHESIA lateral internal partial sphincterotomy, hemorrhoidal ligation pixie, HEMORRHOIDECTOMY;  Surgeon: Michael Boston, MD;  Location: [facility];  Service: General;  Laterality: N/A;   EXAM ANORECTAL W/ ULTRASOUND     Dr. Johney Maine 03-28-18    EXPLORATORY LAPAROTOMY     FACIAL LACERATIONS REPAIR     Facial Laceration repair as adolescent    INJECTION HIP INTRA ARTICULAR  2008   Left Hip  fluoroscopically-guided corticosteorid injection for painful osteoarthritis with good effect (06/2007)   LAPAROSCOPY FOR ECTOPIC PREGNANCY     LUMBAR LAMINECTOMY/DECOMPRESSION MICRODISCECTOMY N/A 06/21/2016   Procedure: MICRO LUMBAR DECOMPRESSION L4-L5 AND REMOVAL OF SYNOVIAL CYST    1 LEVEL;  Surgeon: Susa Day, MD;  Location: [facility];  Service: Orthopedics;  Laterality: N/A;   MRSA     NM MYOVIEW LTD  2011   Dr Daneen Schick, III (Card)   RECTOCELE REPAIR  [phone]   Dr Gertie Fey (  GYN)   SPHINCTEROTOMY N/A 03/28/2018   Procedure: ANAL SPHINCTEROTOMY;  Surgeon: Michael Boston, MD;  Location: [facility];  Service: General;  Laterality: N/A;   UPPER GASTROINTESTINAL ENDOSCOPY      OB/GYN HISTORY:  OB History  Gravida Para Term Preterm AB Living  5 4          SAB IAB Ectopic Multiple Live Births               # Outcome Date GA Lbr Len/2nd Weight Sex Delivery Anes PTL Lv  5 Gravida           4 Para           3 Para           2 Para           1 Para             No LMP recorded. Patient has had a hysterectomy.  MEDICATIONS: No current facility-administered medications on file prior to encounter.   Current Outpatient Medications on File Prior to Encounter  Medication Sig Dispense Refill   atorvastatin (LIPITOR) 20 MG tablet TAKE 1 TABLET BY MOUTH EVERY DAY (Patient taking differently: Take 20 mg by mouth daily.) 34 tablet 11   Calcium Carb-Cholecalciferol (CALCIUM-VITAMIN D) 600-400 MG-UNIT TABS Take 1 tablet by mouth daily.     Carboxymethylcellulose Sodium (THERATEARS) 0.25 % SOLN Place 1 drop into both eyes daily as needed (dry eyes).     citalopram (CELEXA) 40 MG tablet Take 0.5 tablets (20 mg total) by mouth at bedtime. (Patient taking differently: Take 40 mg by mouth daily.)  0   darifenacin (ENABLEX) 7.5 MG 24 hr tablet TAKE 1 TABLET BY MOUTH EVERY DAY (Patient taking differently: Take 7.5 mg by mouth daily.) 90 tablet 3   fexofenadine (ALLEGRA) 180 MG tablet Take 180 mg by mouth  daily.     fluticasone (FLONASE) 50 MCG/ACT nasal spray Place 2 sprays into both nostrils daily.     hydrochlorothiazide (HYDRODIURIL) 25 MG tablet TAKE 1 TABLET BY MOUTH EVERY DAY (Patient taking differently: Take 25 mg by mouth daily.) 90 tablet 3   hydrOXYzine (VISTARIL) 50 MG capsule Take 150 mg by mouth at bedtime.      ketoconazole (NIZORAL) 2 % cream APPLY TOPICALLY TO AFFECTED AREA EVERY DAY (Patient taking differently: Apply 1 application topically daily as needed for irritation.) 15 g 0   KLOR-CON M10 10 MEQ tablet TAKE 4 TABLETS (40 MEQ TOTAL) BY MOUTH AT BEDTIME. (Patient taking differently: Take 40 mEq by mouth at bedtime.) 360 tablet 3   levothyroxine (SYNTHROID) 125 MCG tablet TAKE 1 TABLET (125 MCG TOTAL) BY MOUTH EVERY MORNING. 30 MINUTES BEFORE FOOD 90 tablet 3   loperamide (IMODIUM A-D) 2 MG capsule Take 1 capsule (2 mg total) by mouth as needed for diarrhea or loose stools. 30 capsule 0   LORazepam (ATIVAN) 1 MG tablet Take 1 mg by mouth 2 (two) times daily as needed for anxiety.     metoprolol tartrate (LOPRESSOR) 50 MG tablet TAKE 1 TABLET BY MOUTH TWICE A DAY (Patient taking differently: Take 50 mg by mouth 2 (two) times daily.) 180 tablet 3   Multiple Vitamins-Minerals (MULTIVITAMIN WITH MINERALS) tablet Take 1 tablet by mouth daily.     omeprazole (PRILOSEC) 40 MG capsule TAKE 1 CAPSULE BY MOUTH EVERY DAY (Patient taking differently: Take 40 mg by mouth daily.) 90 capsule 3   polyethylene glycol powder (GLYCOLAX/MIRALAX) 17 GM/SCOOP powder Take 17  g by mouth as needed for mild constipation.     prazosin (MINIPRESS) 2 MG capsule Take 2 mg by mouth at bedtime.     Probiotic Product (PROBIOTIC DAILY PO) Take 1 capsule by mouth daily.     prochlorperazine (COMPAZINE) 10 MG tablet TAKE ONE TABLET BY MOUTH TWICE A DAY AS NEEDED FOR NAUSEA OR VOMITING (Patient taking differently: Take 10 mg by mouth 2 (two) times daily as needed for nausea or vomiting. TAKE ONE TABLET BY MOUTH TWICE  A DAY AS NEEDED FOR NAUSEA OR VOMITING) 30 tablet 1   ramipril (ALTACE) 10 MG capsule TAKE 1 CAPSULE BY MOUTH EVERY DAY (Patient taking differently: Take 10 mg by mouth daily. TAKE 1 CAPSULE BY MOUTH EVERY DAY) 90 capsule 3   simethicone (MYLICON) 80 MG chewable tablet Chew 160 mg by mouth daily as needed (gas).     traZODone (DESYREL) 100 MG tablet Take 3 tablets (300 mg total) by mouth at bedtime.     vitamin B-12 (CYANOCOBALAMIN) 1000 MCG tablet Take 1,000 mcg by mouth daily.       ALLERGIES:  Allergies  Allergen Reactions   Diclofenac-Misoprostol Shortness Of Breath    elevated blood pressure   Aripiprazole     Doesn't want to take because of possible side effects   Emsam [Selegiline] Other (See Comments)    Burned the skin after using for a year   Estradiol Swelling   Ibuprofen Hives   Naproxen Nausea Only   Neurontin [Gabapentin] Hives and Swelling   Paroxetine Other (See Comments)    Panic attacks   Prednisone Other (See Comments)    Severe depression   Tape Other (See Comments)    Paper tape caused blisters, plastic tape ok     FAMILY HISTORY:  Family History  Problem Relation Age of Onset   Diabetes Father    Heart disease Father    Diabetes type II Other    Hypertension Other    Heart attack Other    Alcohol abuse Other    Depression Other    Asthma Other    Migraines Other    Hypertension Mother    Dementia Mother    Hypertension Sister    Obesity Sister    Kidney disease Brother    Hypertension Daughter    Hypertension Son    Heart disease Brother    Hypertension Brother    Bipolar disorder Daughter    Hypertension Daughter    Breast cancer Daughter 46   Cancer Daughter 102       vulvar cancer   Other Daughter        BRIP1 gene mutation c.2010dup   Hypertension Son    Colon polyps Son 19   Diabetes Maternal Aunt    Lung cancer Paternal Aunt        d. >50   Heart Problems Paternal Grandmother    Diabetes Paternal Grandfather    Cancer Paternal  Aunt        unknown type, dx >50   Cancer Paternal Great-grandmother        abdominal cancer NOS   Breast cancer Other        paternal great-great aunt (PGF's aunt)   Cancer Cousin 17       gynecologic cancer NOS (paternal first cousin)   Cancer Niece 16       gynecologic cancer NOS    Esophageal cancer Neg Hx    Stomach cancer Neg Hx    Rectal cancer  Neg Hx    Colon cancer Neg Hx    Prostate cancer Neg Hx    Pancreatic cancer Neg Hx      SOCIAL HISTORY:  Social Connections: Moderately Integrated   Frequency of Communication with Friends and Family: More than three times a week   Frequency of Social Gatherings with Friends and Family: Once a week   Attends Religious Services: More than 4 times per year   Active Member of [employer] or Organizations: Yes   Attends Archivist Meetings: Never   Marital Status: Divorced    REVIEW OF SYSTEMS:  + fatigue, depression, anxiety Denies appetite changes, fevers, chills, unexplained weight changes. Denies hearing loss, neck lumps or masses, mouth sores, ringing in ears or voice changes. Denies cough or wheezing.  Denies shortness of breath. Denies chest pain or palpitations. Denies leg swelling. Denies abdominal distention, pain, blood in stools, constipation, diarrhea, nausea, vomiting, or early satiety. Denies pain with intercourse, dysuria, frequency, hematuria or incontinence. Denies hot flashes, pelvic pain, vaginal bleeding or vaginal discharge.   Denies joint pain, back pain or muscle pain/cramps. Denies itching, rash, or wounds. Denies dizziness, headaches, numbness or seizures. Denies swollen lymph nodes or glands, denies easy bruising or bleeding. Denies confusion, or decreased concentration  Physical Exam:  Vital Signs for this encounter:  Blood pressure (!) 163/92, pulse 72, temperature 98.1 F (36.7 C), temperature source Oral, resp. rate 18, height _0  (1.626 m), weight 243 lb 9.7 oz (110.5 kg), SpO2 95 %. Body  mass index is 41.82 kg/m. General: Alert, oriented, no acute distress.  HEENT: Normocephalic, atraumatic. Sclera anicteric. Chest: Clear to auscultation bilaterally. No wheezes, rhonchi, or rales. Cardiovascular: Regular rate and rhythm, no murmurs, rubs, or gallops. Abdomen: Obese. Normoactive bowel sounds. Soft, nondistended, nontender to palpation. No masses or hepatosplenomegaly appreciated. No palpable fluid wave. Extremities: Grossly normal range of motion. Warm, well perfused. No edema bilaterally.  LABORATORY AND RADIOLOGIC DATA:  Outside medical records were reviewed to synthesize the above history, along with the history and physical obtained during the visit.   Lab Results  Component Value Date   WBC 7.1 05/10/2021   HGB 14.7 05/10/2021   HCT 44.4 05/10/2021   PLT 255 05/10/2021   GLUCOSE 181 (H) 05/10/2021   CHOL 143 04/01/2020   TRIG 180 (H) 04/01/2020   HDL 51 04/01/2020   LDLDIRECT 58 08/07/2019   LDLCALC 62 04/01/2020   ALT 35 11/16/2020   AST 27 11/16/2020   NA 137 05/10/2021   K 4.4 05/10/2021   CL 100 05/10/2021   CREATININE 0.93 05/10/2021   BUN 13 05/10/2021   CO2 29 05/10/2021   TSH 4.390 11/15/2017   INR 1.04 01/27/2010   HGBA1C 6.3 (H) 05/10/2021

## 2021-05-19 NOTE — Anesthesia Postprocedure Evaluation (Signed)
Anesthesia Post Note  Patient: Johnie Stadel  Procedure(s) Performed: XI ROBOTIC ASSISTED LEFT SALPINGO- OOPHORECTOMY WITH PELVIC WASHINGS (Pelvis) LAPAROSCOPIC LYSIS OF ADHESIONS; BIOPSY ABDOMINAL SKIN LESION (Abdomen)     Patient location during evaluation: PACU Anesthesia Type: General Level of consciousness: awake and alert Pain management: pain level controlled Vital Signs Assessment: post-procedure vital signs reviewed and stable Respiratory status: spontaneous breathing, nonlabored ventilation, respiratory function stable and patient connected to nasal cannula oxygen Cardiovascular status: blood pressure returned to baseline and stable Postop Assessment: no apparent nausea or vomiting Anesthetic complications: no   No notable events documented.  Last Vitals:  Vitals:   05/19/21 1115 05/19/21 1141  BP: (!) 144/66 (!) 151/74  Pulse: 65 71  Resp: 15 18  Temp:  36.5 C  SpO2: 97% 96%    Last Pain:  Vitals:   05/19/21 1141  TempSrc: Oral  PainSc: 0-No pain                 Barnet Glasgow

## 2021-05-19 NOTE — Anesthesia Procedure Notes (Signed)
Procedure Name: Intubation Date/Time: 05/19/2021 7:35 AM Performed by: Claudia Desanctis, CRNA Pre-anesthesia Checklist: Patient identified, Emergency Drugs available, Suction available and Patient being monitored Patient Re-evaluated:Patient Re-evaluated prior to induction Oxygen Delivery Method: Circle system utilized Preoxygenation: Pre-oxygenation with 100% oxygen Induction Type: IV induction Ventilation: Mask ventilation without difficulty and Oral airway inserted - appropriate to patient size Laryngoscope Size: 2 and Miller Grade View: Grade I Tube type: Oral Tube size: 7.0 mm Number of attempts: 1 Airway Equipment and Method: Stylet Placement Confirmation: ETT inserted through vocal cords under direct vision, positive ETCO2 and breath sounds checked- equal and bilateral Secured at: 21 cm Tube secured with: Tape Dental Injury: Teeth and Oropharynx as per pre-operative assessment

## 2021-05-19 NOTE — Interval H&P Note (Signed)
History and Physical Interval Note:  05/19/2021 7:10 AM  [name]  has presented today for surgery, with the diagnosis of BRIP 1 GENE MUTATION.  The various methods of treatment have been discussed with the patient and family. After consideration of risks, benefits and other options for treatment, the patient has consented to  Procedure(s): XI ROBOTIC ASSISTED BILATERAL SALPINGO OOPHORECTOMY VS UNILATERAL SALPINGO OOPHORECTOMY WITH PELVIC WASHINGS (N/A) POSSIBLE LAPAROTOMY WITH POSSIBLE STAGING (N/A) as a surgical intervention.  The patient's history has been reviewed, patient examined, no change in status, stable for surgery.  I have reviewed the patient's chart and labs.  Questions were answered to the patient's satisfaction.     Lafonda Mosses

## 2021-05-19 NOTE — Transfer of Care (Signed)
Immediate Anesthesia Transfer of Care Note  Patient: Talaysha Freeberg  Procedure(s) Performed: XI ROBOTIC ASSISTED LEFT SALPINGO- OOPHORECTOMY WITH PELVIC WASHINGS (Pelvis) LAPAROSCOPIC LYSIS OF ADHESIONS; BIOPSY ABDOMINAL SKIN LESION (Abdomen)  Patient Location: PACU  Anesthesia Type:General  Level of Consciousness: awake, alert , oriented and patient cooperative  Airway & Oxygen Therapy: Patient Spontanous Breathing and Patient connected to face mask  Post-op Assessment: Report given to RN and Post -op Vital signs reviewed and stable  Post vital signs: Reviewed and stable  Last Vitals:  Vitals Value Taken Time  BP 147/104 05/19/21 0920  Temp    Pulse 70 05/19/21 0925  Resp 14 05/19/21 0925  SpO2 95 % 05/19/21 0925  Vitals shown include unvalidated device data.  Last Pain:  Vitals:   05/19/21 0618  TempSrc:   PainSc: 3       Patients Stated Pain Goal: 2 (00/51/10 2111)  Complications: No notable events documented.

## 2021-05-19 NOTE — Op Note (Signed)
OPERATIVE NOTE  Pre-operative Diagnosis: BRIP1 mutation  Post-operative Diagnosis: same, intra-abdominal adhesions, somewhat asymmetric left upper abdominal mole  Operation: Laparoscopic lysis of adhesions, robotic-assisted laparoscopic unilateral salpingo-oophorectomy, biopsy of left abdominal mole   Surgeon: Jeral Pinch MD  Assistant Surgeon: Lahoma Crocker MD (an MD assistant was necessary for tissue manipulation, management of robotic instrumentation, retraction and positioning due to the complexity of the case and hospital policies).   Anesthesia: GET  Urine Output: 300  Operative Findings: On EUA, smooth vaginal mucosa. No adnexal masses appreciated. 1cm upper abdominal mole with some irregular border and surface. ON intra-abdominal entry, upper abdomen with significant adhesions; omentum adherent to the anterior abdominal wall along the midline from the level of the umbilicus up to the falciform. Normal appearing liver, stomach. Normal appearing small and large bowel. Adhesions of the sigmoid to the left sidewall and pelvic sidewall as well as the ovary and IP ligament on the left. Right adnexa and uterus/cervix surgically absent. No ascites. Left tube and ovary normal in appearance.  Estimated Blood Loss:  less than 50 mL      Total IV Fluids: see I&O flowsheet         Specimens: pelvic washings, left tube and ovary, left upper abdominal mole biopsy         Complications:  None apparent; patient tolerated the procedure well.         Disposition: PACU - hemodynamically stable.  Procedure Details  The patient was seen in the Holding Room. The risks, benefits, complications, treatment options, and expected outcomes were discussed with the patient.  The patient concurred with the proposed plan, giving informed consent.  The site of surgery properly noted/marked. The patient was identified as [name] and the procedure verified as a Robotic-assisted unilateral salpingo  oophorectomy.   After induction of anesthesia, the patient was draped and prepped in the usual sterile manner. Patient was placed in supine position after anesthesia and draped and prepped in the usual sterile manner as follows: Her arms were tucked to her side with all appropriate precautions.  The shoulders were stabilized with padded shoulder blocks applied to the acromium processes.  The patient was placed in the semi-lithotomy position in [place].  The perineum and vagina were prepped with CholoraPrep. The patient was draped after the CholoraPrep had been allowed to dry for 3 minutes.  A Time Out was held and the above information confirmed.  The urethra was prepped with Betadine. Foley catheter was placed. OG tube placement was confirmed and to suction.   Next, a 10 mm skin incision was made 1 cm below the subcostal margin in the midclavicular line.  The 5 mm Optiview port and scope was used for direct entry.  Opening pressure was under 10 mm CO2.  The abdomen was insufflated and the findings were noted as above.   At this point and all points during the procedure, the patient's intra-abdominal pressure did not exceed 15 mmHg. Next, two 8 mm skin incisions were made in the left abdomen. Sharp dissection was then used to lyse adhesions of the omentum to the anterior abdominal wall along the midline until just above the level of the umbilicus. An 26m incision was then made just lateral the umbilicus on the left and a right port was placed about 8 cm lateral to the robot port on the right side.  All ports were placed under direct visualization.  The patient was placed in steep Trendelenburg.  The robot was docked  in the normal manner.  Pelvic washings were obtained. The left peritoneum were opened parallel to the IP ligament to open the retroperitoneal space. A combination of sharp dissection and short bursts of electrocautery were used to lyse the adhesions of the sigmoid colon mesentery to the  left sidewall and left ovary. Once the adnexa was freed from the colon, the adnexa was elevated by the assist and the retroperitoneal space was further opened. The ureter was noted to be on the medial leaf of the broad ligament.  The peritoneum above the ureter was incised. The peritoneum inferior to the ovary was cauterized, maintaining visualization of the ureter inferiorly along the medial broad ligament. The IP ligament was skeletonized, cauterized and cut, freeing the left adnexa.   One of the left lateral incisions (which was not docked to the robot) was increased with a scalpel to 59m. The 8 mm assist trocar was exchanged for a 10-12 mm port. A 10cm endocatch bag was inserted through this trocar and into the abdomen. The left adnexa was placed in the bag and removed through the assist port.   Irrigation was used and excellent hemostasis was achieved.  At this point in the procedure was completed.  Robotic instruments were removed under direct visulaization.  The robot was undocked. The fascia at the 10-12 mm port was closed with 0 Vicryl on a UR-5 needle.  The subcuticular tissue was closed with 4-0 Vicryl and the skin was closed with 4-0 Monocryl in a subcuticular manner.  Dermabond was applied.    The shave biopsy was taken of the LUQ abdominal mole using a scalpel. This was hemostatic and dressed with 2x2 and tegaderm.  Foley catheter was removed. All sponge, lap and needle counts were correct x  3.   The patient was transferred to the recovery room in stable condition.  KJeral Pinch MD

## 2021-05-19 NOTE — Discharge Instructions (Addendum)
05/19/2021  Activity: 1. Be up and out of the bed during the day.  Take a nap if needed.  You may walk up steps but be careful and use the hand rail.  Stair climbing will tire you more than you think, you may need to stop part way and rest.   2. No lifting or straining for 6 weeks.  3. No driving for until off of narcotics and feel like you can brake safely. For most people this is 5-10 weeks.    4. Shower daily.  Use soap and water on your incision and pat dry; don't rub.   5. No sexual activity and nothing in the vagina for 4-6 weeks.  Medications:  - Take ibuprofen and tylenol first line for pain control. Take these regularly (every 6 hours) to decrease the build up of pain.  - If necessary, you can take your normal pain medications for more severe pain.  Diet: 1. Low sodium Heart Healthy Diet is recommended.  2. It is safe to use a laxative if you have difficulty moving your bowels.   Wound Care: 1. Keep clean and dry.  Shower daily.  Reasons to call the Doctor:  Fever - Oral temperature greater than 100.4 degrees Fahrenheit Difficulty urinating Nausea and vomiting Increased pain at the site of the incision that is unrelieved with pain medicine. Difficulty breathing with or without chest pain New calf pain especially if only on one side   Follow-up: 1. See Jeral Pinch in 3-4 weeks as scheduled.  Contacts: For questions or concerns you should contact:  Dr. Jeral Pinch at [phone] After hours and on week-ends call [phone] and ask to speak to the physician on call for Gynecologic Oncology

## 2021-05-20 ENCOUNTER — Encounter (HOSPITAL_COMMUNITY): Payer: Self-pay | Admitting: Gynecologic Oncology

## 2021-05-20 ENCOUNTER — Telehealth: Payer: Self-pay

## 2021-05-20 LAB — SURGICAL PATHOLOGY

## 2021-05-20 LAB — CYTOLOGY - NON PAP

## 2021-05-20 NOTE — Telephone Encounter (Signed)
Spoke with [name]. She states that she is eating, drinking and urinating well. She has had  2 BM one last night and one today. Passing gas as well.. She is taking senokot as prescribed and encouraged her to drink plenty of water. She denies fever or chills. Incisions are dry and intact. Her pain is controlled with hydrocodone/Apap 10-325 mg. Told her that she could remove the dressing on the abdomen where the biopsy was taken. She does not need to put a new dressing on if no bleeding.  She can use a Band-Aid if needed.   Instructed to call office with any fever, chills, purulent drainage, uncontrolled pain or any other questions or concerns. Patient verbalizes understanding.  Pt aware of post op appointments as well as the office number [phone] and after hours number [phone] to call if she has any questions or concerns

## 2021-05-20 NOTE — Telephone Encounter (Signed)
LM for Ms [name] to call back to the office to see how she is doing after her surgery yesterday.

## 2021-05-25 ENCOUNTER — Telehealth: Payer: Self-pay | Admitting: Oncology

## 2021-05-25 NOTE — Telephone Encounter (Signed)
Anselm Jungling and discussed her pathology from surgery.  She did see the results on MyChart and the message from Dr. Berline Lopes.  Advised her that we are canceling the phone visit with Dr. Berline Lopes tomorrow and reviewed her post op appointment time on 06/10/21.

## 2021-05-26 ENCOUNTER — Inpatient Hospital Stay: Payer: PPO | Admitting: Gynecologic Oncology

## 2021-06-02 ENCOUNTER — Ambulatory Visit (INDEPENDENT_AMBULATORY_CARE_PROVIDER_SITE_OTHER): Payer: PPO | Admitting: Family Medicine

## 2021-06-02 ENCOUNTER — Encounter: Payer: Self-pay | Admitting: Family Medicine

## 2021-06-02 ENCOUNTER — Other Ambulatory Visit: Payer: Self-pay

## 2021-06-02 DIAGNOSIS — E1169 Type 2 diabetes mellitus with other specified complication: Secondary | ICD-10-CM

## 2021-06-02 DIAGNOSIS — E1159 Type 2 diabetes mellitus with other circulatory complications: Secondary | ICD-10-CM

## 2021-06-02 DIAGNOSIS — E669 Obesity, unspecified: Secondary | ICD-10-CM

## 2021-06-02 DIAGNOSIS — I152 Hypertension secondary to endocrine disorders: Secondary | ICD-10-CM

## 2021-06-02 DIAGNOSIS — M48061 Spinal stenosis, lumbar region without neurogenic claudication: Secondary | ICD-10-CM

## 2021-06-02 DIAGNOSIS — G2581 Restless legs syndrome: Secondary | ICD-10-CM | POA: Diagnosis not present

## 2021-06-02 DIAGNOSIS — K580 Irritable bowel syndrome with diarrhea: Secondary | ICD-10-CM | POA: Diagnosis not present

## 2021-06-02 DIAGNOSIS — E119 Type 2 diabetes mellitus without complications: Secondary | ICD-10-CM

## 2021-06-02 DIAGNOSIS — G894 Chronic pain syndrome: Secondary | ICD-10-CM | POA: Diagnosis not present

## 2021-06-02 DIAGNOSIS — E785 Hyperlipidemia, unspecified: Secondary | ICD-10-CM

## 2021-06-02 DIAGNOSIS — M160 Bilateral primary osteoarthritis of hip: Secondary | ICD-10-CM | POA: Diagnosis not present

## 2021-06-02 DIAGNOSIS — G8929 Other chronic pain: Secondary | ICD-10-CM | POA: Diagnosis not present

## 2021-06-02 DIAGNOSIS — M47816 Spondylosis without myelopathy or radiculopathy, lumbar region: Secondary | ICD-10-CM | POA: Diagnosis not present

## 2021-06-02 DIAGNOSIS — K529 Noninfective gastroenteritis and colitis, unspecified: Secondary | ICD-10-CM

## 2021-06-02 NOTE — Patient Instructions (Addendum)
Congratulation on being in partial diabetic remission.   Try to decrease how often you take the dicyclomine to once a day.

## 2021-06-03 ENCOUNTER — Encounter: Payer: Self-pay | Admitting: Family Medicine

## 2021-06-03 NOTE — Assessment & Plan Note (Signed)
Established problem. Stable,  Wt Readings from Last 3 Encounters:  06/02/21 248 lb 6 oz (112.7 kg)  05/19/21 243 lb 9.7 oz (110.5 kg)  05/10/21 246 lb 12.8 oz (111.9 kg)

## 2021-06-03 NOTE — Assessment & Plan Note (Signed)
Established problem Well Controlled. No signs of complications, medication side effects, or red flags. Continue dietary intervention Consider start of Ozempic for glycemic control ansd weight loss

## 2021-06-03 NOTE — Progress Notes (Signed)
Catriona Arcangel is alone Sources of clinical information for visit is/are patient, past medical records, and DC summary and Outpt follow up GYN-Onc. Nursing assessment for this office visit was reviewed with the patient for accuracy and revision.     Previous Report(s) Reviewed:   Depression screen PHQ 2/9 06/02/2021  Decreased Interest 2  Down, Depressed, Hopeless 2  PHQ - 2 Score 4  Altered sleeping 2  Tired, decreased energy 3  Change in appetite 1  Feeling bad or failure about yourself  2  Trouble concentrating 2  Moving slowly or fidgety/restless 0  Suicidal thoughts 0  PHQ-9 Score 14  Difficult doing work/chores Very difficult  Some recent data might be hidden    Fall Risk  04/28/2021 03/03/2021 12/02/2020 09/16/2020 04/01/2020  Falls in the past year? 0 0 0 0 0  Number falls in past yr: 0 0 0 - 0  Injury with Fall? 0 0 0 - -  Risk Factor Category  - - - - -  Risk for fall due to : - - Impaired balance/gait;Impaired mobility - -  Follow up - - Falls evaluation completed - -    PHQ9 SCORE ONLY 06/02/2021 04/28/2021 03/03/2021  PHQ-9 Total Score '14 16 15    '$ Adult vaccines due  Topic Date Due   TETANUS/TDAP  10/21/2023   History/P.E. limitations: none  Adult vaccines due  Topic Date Due   TETANUS/TDAP  10/21/2023    Diabetes Health Maintenance Due  Topic Date Due   OPHTHALMOLOGY EXAM  08/26/2021   FOOT EXAM  09/16/2021   HEMOGLOBIN A1C  11/10/2021   There are no preventive care reminders to display for this patient.   Chief Complaint  Patient presents with   Diabetes

## 2021-06-03 NOTE — Assessment & Plan Note (Signed)
Established problem Well Controlled. No signs of complications, medication side effects, or red flags. Continue current medications and other regiments.  

## 2021-06-03 NOTE — Assessment & Plan Note (Signed)
Established problem Controlled pain which allows completion of ADLs and iADLs No adverse effects No aberrant behaviors Continue current therapy regiment. 

## 2021-06-03 NOTE — Assessment & Plan Note (Addendum)
Established problem Well Controlled. No signs of complications, medication side effects, or red flags. Continue current medications and other regiments.  Check Lipid panel next office visit

## 2021-06-03 NOTE — Addendum Note (Signed)
Addended byWendy Poet, Yvetta Drotar D on: 06/03/2021 02:02 PM   Modules accepted: Orders

## 2021-06-03 NOTE — Assessment & Plan Note (Signed)
Established problem Well Controlled. Reduce Bentyl to one tablet a day prn

## 2021-06-03 NOTE — Assessment & Plan Note (Signed)
Established problem. Stable.  No signs of complications, medication side effects, or red flags. Continue current medications and other regiments.  

## 2021-06-03 NOTE — Assessment & Plan Note (Signed)
Lab Results  Component Value Date   HGBA1C 6.3 (H) 05/10/2021  Established problem Well Controlled. No signs of complications, medication side effects, or red flags. Continue current medications and other regiments.  Consider start of Ozempic for glycemic control ansd weight loss

## 2021-06-07 ENCOUNTER — Encounter: Payer: Self-pay | Admitting: Family Medicine

## 2021-06-07 DIAGNOSIS — M160 Bilateral primary osteoarthritis of hip: Secondary | ICD-10-CM

## 2021-06-07 DIAGNOSIS — G894 Chronic pain syndrome: Secondary | ICD-10-CM

## 2021-06-07 DIAGNOSIS — G2581 Restless legs syndrome: Secondary | ICD-10-CM

## 2021-06-07 DIAGNOSIS — M47896 Other spondylosis, lumbar region: Secondary | ICD-10-CM

## 2021-06-07 DIAGNOSIS — M48061 Spinal stenosis, lumbar region without neurogenic claudication: Secondary | ICD-10-CM

## 2021-06-07 MED ORDER — MORPHINE SULFATE ER 15 MG PO TBCR: 15.0000 mg | EXTENDED_RELEASE_TABLET | Freq: Two times a day (BID) | ORAL | 0 refills | Status: DC

## 2021-06-07 MED ORDER — HYDROCODONE-ACETAMINOPHEN 10-325 MG PO TABS: 1.0000 | ORAL_TABLET | Freq: Four times a day (QID) | ORAL | 0 refills | Status: DC | PRN

## 2021-06-10 ENCOUNTER — Other Ambulatory Visit: Payer: Self-pay

## 2021-06-10 ENCOUNTER — Inpatient Hospital Stay: Payer: PPO | Attending: Gynecologic Oncology | Admitting: Gynecologic Oncology

## 2021-06-10 VITALS — BP 127/69 | HR 64 | Temp 97.7°F | Resp 18 | Ht 64.0 in | Wt 238.4 lb

## 2021-06-10 DIAGNOSIS — Z90721 Acquired absence of ovaries, unilateral: Secondary | ICD-10-CM

## 2021-06-10 DIAGNOSIS — Z1501 Genetic susceptibility to malignant neoplasm of breast: Secondary | ICD-10-CM

## 2021-06-10 DIAGNOSIS — Z1502 Genetic susceptibility to malignant neoplasm of ovary: Secondary | ICD-10-CM

## 2021-06-10 DIAGNOSIS — Z7189 Other specified counseling: Secondary | ICD-10-CM

## 2021-06-10 DIAGNOSIS — Z1589 Genetic susceptibility to other disease: Secondary | ICD-10-CM

## 2021-06-10 DIAGNOSIS — Z148 Genetic carrier of other disease: Secondary | ICD-10-CM

## 2021-06-10 NOTE — Progress Notes (Signed)
Gynecologic Oncology Return Clinic Visit  06/10/2021  Reason for Visit: Follow-up after surgery  Treatment History: 05/19/2021: Laparoscopic lysis of adhesion, robotic assisted USO, abdominal wall biopsy.  Final pathology revealed benign tube and ovary, benign mole.  Interval History: Patient presents today for follow-up.  She has done well since surgery.  She denies any issues related to her incisions.  She reports baseline bowel and bladder function.  She is tolerating a regular diet without nausea or emesis.  She denies any abdominal pain, is no longer using any medication for discomfort.  Past Medical/Surgical History: Past Medical History:  Diagnosis Date   Adrenal adenoma, left 12/02/2020   CT AP 11/16/20:  left adrenal mass measuring approximately 2.7 cm. This is minimally increased in size from prior study and is consistent with a benign adrenal adenoma.    Adrenal nodule ([place]) 04/08/2011   04/08/11 Abdominal CT showed Left adrenal nodule which is tachnically indeterminate but most likely an adenoma.  It is 1.7 cm and 42 HU on portal venous phase image. 31 HU on delayed images.   01/09/2012 Abdominal CT w/ & w/o CM: Stable benign left adrenal adenoma. No further specific follow-up is needed for this finding.     Allergic rhinitis 01/03/2007   Qualifier: Diagnosis of  By: Drucie Ip     Allergy    Anemia    hx of  in 20's   Anxiety    Bipolar disorder ([place])    Carpal tunnel syndrome 02/14/2007   S/P carpel tunnel release bilaterally.     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 03/28/2018 03/28/2018   COPD WITHOUT EXACERBATION 01/03/2007   Qualifier: Diagnosis of  By: Drucie Ip  emphysema   Coronary artery calcification seen on CAT scan 12/03/2017   11/2017 Chest CT: Left anterior descending and right coronary atherosclerosis   Diabetes mellitus without complication  (HCC)    Z6X 6.5 as of 05-2019- diet changes, no meds currently    DISC WITH RADICULOPATHY 01/03/2007   Qualifier: Diagnosis of  By: Drucie Ip     Emphysema of lung ([place])    Esophageal reflux 01/03/2007   Centricity Description: GASTROESOPHAGEAL REFLUX, NO ESOPHAGITIS Qualifier: Diagnosis of  By: Drucie Ip   Centricity Description: GERD Qualifier: Diagnosis of  By: Tye Savoy MD, [place]     External hemorrhoids s/p hemorrhoidectomy 03/28/2018 03/28/2018   Family history of breast cancer    Family history of cancer of female genital organ    Family history of colon cancer    Family history of gene mutation    BRIP1 gene mutation (c.[id number]) in daughter   Family history of lung cancer    Fibromyalgia    Fracture of bone spur of inferior portion of calcaneus 10/06/2018   Functional gastrointestinal disorder 06/18/2008   Functional Gastrointestinal Disorder with frequent eructation and bloating sensation with acute flares.  Responsive to Compazine and Bentyl.  Takes a few days to calm down.     GERD without esophagitis 01/03/2007   Centricity Description: GASTROESOPHAGEAL REFLUX, NO ESOPHAGITIS Qualifier: Diagnosis of  By: Drucie Ip   Centricity Description: GERD Qualifier: Diagnosis of  By: Tye Savoy MD, [place]     Hearing impairment 04/28/2021   Heel spur, fracture, left 04/17/2018   HEMORRHOIDS, NOS 01/03/2007   Qualifier: Diagnosis of  By: Drucie Ip     Hepatic steatosis 04/12/2011   Herpes simplex labialis  05/11/2011   Hip osteoarthritis 05/23/2007   MRI Pelvis (03/13/07) Bilateral osteoarthritis of the hips, left greater than right.  The  patient has a slightly more prominent hip effusion on the left than  the right.  No other significant abnormality.     History of MRSA infection 04/28/2011   HYPERCHOLESTEROLEMIA 12/24/2008   Hypertension    HYPERTENSION, BENIGN SYSTEMIC 01/03/2007   Qualifier: Diagnosis of  By: Drucie Ip     HYPERTRIGLYCERIDEMIA 11/11/2007   Qualifier:  Diagnosis of  By: McDiarmid MD, Todd     Hypokalemia 12/12/2017   Hypothyroidism 02/15/2007   Qualifier: Diagnosis of  By: McDiarmid MD, Jannifer Franklin, BORDERLINE 02/15/2007   Irritable bowel syndrome 01/03/2007   Qualifier: Diagnosis of  By: Drucie Ip     LACTOSE INTOLERANCE 03/10/2008   Major depressive disorder, recurrent episode ([place]) 01/03/2007   Qualifier: Diagnosis of  By: Drucie Ip     Medication management 12/14/2016   Managing topiramate for weight loss starting 12/14/16   Memory difficulties 03/04/2021   No impairment of memory 04/29/2021   Obesity (BMI 30.0-34.9) 09/30/2007   Has lost 55 lbs since easter. Goal of <190 by 08/05/12.      Obesity, Class III, BMI 40-49.9 (morbid obesity) ([place]) 09/30/2007        Osteoarthritis of spine 01/03/2007   MR I Lumbar (08/03): GeneralDDD; L2-3 Large  HNP.  Mod pos  hnp - 12/14/2005, smhnpw/irritL4root - 12/14/2005     OSTEOARTHRITIS, HANDS, BILATERAL 06/18/2008   Qualifier: Diagnosis of  By: McDiarmid MD, Kristeen Miss FEMORAL STRESS SYNDROME 01/03/2007   Qualifier: Diagnosis of  By: Drucie Ip     Periodic limb movements of sleep 12/05/2010   Diagnosed on Polysomnography testing Fall 2011.      Pneumonia    walking pneumonia in the 90's   Pre-diabetes 12/25/2008   Qualifier: Diagnosis of  By: McDiarmid MD, Todd     Prolapsed internal hemorrhoids, grade 3, s/p ligation/pexy/hemorrhoidectomy 03/28/2018 03/28/2018   RESTLESS LEGS SYNDROME 09/11/2007   Polysomnography (10/2010, Dr Keturah Barre, interpreter. ) showed periodic limb movements that frequently awoke patient from sleep with arousal index of 4 per hour. Dr Danton Sewer (Sleep Specialist) thought her RLS was the major contributor to patient poor sleep condition, more than any sleep apnea.  He recommended considering opamine agonist (either requip or mirapex) to see if this will help.      Rosacea, acne 10/24/2011   SIGMOID POLYP 01/23/2008   Solitary lung nodule 04/08/2011    Stage 1 mild COPD by GOLD classification ([place]) 01/03/2007   11/28/2017 Chest CT: Mild centrilobular and paraseptal emphysema with mild diffuse bronchial wall thickening     Stress incontinence    Synovial cyst of lumbar facet joint 02/17/2016   Trigger thumb of left hand 08/16/2018   Urticaria, chronic 05/22/2011   VITAMIN D DEFICIENCY 03/03/2010   Work-related condition 05/17/2018    Past Surgical History:  Procedure Laterality Date   APPENDECTOMY     bladder tack  1998   Dr Jeffie Pollock (urology)   BUNIONECTOMY  1992   bilateral feet with pins in great toes   [place]  2010   Bilateral, Dr Herbie Baltimore Sypher   COLONOSCOPY  2004   Dr Verdia Kuba (GI)   CYSTOCELE REPAIR  1998    Dr Gertie Fey (GYN)-   ESOPHAGOGASTRODUODENOSCOPY     EVALUATION UNDER ANESTHESIA WITH HEMORRHOIDECTOMY N/A 03/28/2018  Procedure: ANORECTAL EXAM UNDER ANESTHESIA lateral internal partial sphincterotomy, hemorrhoidal ligation pixie, HEMORRHOIDECTOMY;  Surgeon: Michael Boston, MD;  Location: [facility];  Service: General;  Laterality: N/A;   EXAM ANORECTAL W/ ULTRASOUND     Dr. Johney Maine 03-28-18    EXPLORATORY LAPAROTOMY     FACIAL LACERATIONS REPAIR     Facial Laceration repair as adolescent    INJECTION HIP INTRA ARTICULAR  2008   Left Hip fluoroscopically-guided corticosteorid injection for painful osteoarthritis with good effect (06/2007)   LAPAROSCOPIC LYSIS OF ADHESIONS  05/19/2021   Procedure: LAPAROSCOPIC LYSIS OF ADHESIONS; BIOPSY ABDOMINAL SKIN LESION;  Surgeon: Lafonda Mosses, MD;  Location: [facility];  Service: Gynecology;;   LAPAROSCOPY FOR ECTOPIC PREGNANCY     LUMBAR LAMINECTOMY/DECOMPRESSION MICRODISCECTOMY N/A 06/21/2016   Procedure: MICRO LUMBAR DECOMPRESSION L4-L5 AND REMOVAL OF SYNOVIAL CYST    1 LEVEL;  Surgeon: Susa Day, MD;  Location: [facility];  Service: Orthopedics;  Laterality: N/A;   MRSA     NM MYOVIEW LTD  2011   Dr Daneen Schick, III (Card)   RECTOCELE REPAIR   1998   Dr Gertie Fey (GYN)   ROBOTIC ASSISTED SALPINGO OOPHERECTOMY N/A 05/19/2021   Procedure: XI ROBOTIC ASSISTED LEFT SALPINGO- OOPHORECTOMY WITH PELVIC WASHINGS;  Surgeon: Lafonda Mosses, MD;  Location: [facility];  Service: Gynecology;  Laterality: N/A;   SPHINCTEROTOMY N/A 03/28/2018   Procedure: ANAL SPHINCTEROTOMY;  Surgeon: Michael Boston, MD;  Location: [facility];  Service: General;  Laterality: N/A;   UPPER GASTROINTESTINAL ENDOSCOPY      Family History  Problem Relation Age of Onset   Diabetes Father    Heart disease Father    Diabetes type II Other    Hypertension Other    Heart attack Other    Alcohol abuse Other    Depression Other    Asthma Other    Migraines Other    Hypertension Mother    Dementia Mother    Hypertension Sister    Obesity Sister    Kidney disease Brother    Hypertension Daughter    Hypertension Son    Heart disease Brother    Hypertension Brother    Bipolar disorder Daughter    Hypertension Daughter    Breast cancer Daughter 56   Cancer Daughter 66       vulvar cancer   Other Daughter        BRIP1 gene mutation c.2010dup   Hypertension Son    Colon polyps Son 27   Diabetes Maternal Aunt    Lung cancer Paternal Aunt        d. >50   Heart Problems Paternal Grandmother    Diabetes Paternal Grandfather    Cancer Paternal Aunt        unknown type, dx >50   Cancer Paternal Great-grandmother        abdominal cancer NOS   Breast cancer Other        paternal great-great aunt (PGF's aunt)   Cancer Cousin 17       gynecologic cancer NOS (paternal first cousin)   Cancer Niece 40       gynecologic cancer NOS    Esophageal cancer Neg Hx    Stomach cancer Neg Hx    Rectal cancer Neg Hx    Colon cancer Neg Hx    Prostate cancer Neg Hx    Pancreatic cancer Neg Hx     Social History   Socioeconomic History   Marital status: Divorced  Spouse name: Not on file   Number of children: 4   Years of education: 72   Highest education level: 12th  grade  Occupational History   Occupation: disability  Tobacco Use   Smoking status: Former    Packs/day: 1.50    Years: 44.00    Pack years: 66.00    Types: Cigarettes    Quit date: 04/06/2008    Years since quitting: 13.1   Smokeless tobacco: Never  Vaping Use   Vaping Use: Never used  Substance and Sexual Activity   Alcohol use: No   Drug use: Yes    Types: Marijuana    Comment: hx of marijuana use at bedtime   Sexual activity: Not Currently  Other Topics Concern   Not on file  Social History Narrative   [facility] POA document appoints Deon Hooks as agent for Ms Maurine Mowbray. [place]eng[place] (DOB 11-16-2050)/ T. McDiarmid, MD 04/28/21      Retired [place] [employer] worker - early retirement for mental health reasons.    Has Worked part-time  at [employer].Marland KitchenMarland KitchenWorking as Scientist, water quality at [employer] on [address]   pt is separated.    Quit smoking tobacco June 09 started at age 28. 1 1/2 to 2 ppd .   No etoh    Hx of smokes marijuana - periodically attempts quitting.   Religious - Pentecostal   4 children    Hobbies- word searches and games on her tablet.   No IVDA         Social Information ( 04/12/2021)    patient lives with daughter in one level apartment ,for past 2 years .    Patient enjoys playing games on her ipad and watching movies . Primary family support persons is her daughter.    Transportation to appointments provided by self;  No concerns or difficulty affording medication:   Strengths:Financial means, Religious Affiliation, Supportive family/friends   Support System: Family, Church, Spirituality and Able to International aid/development worker Activities of Daily Living    Dressing:Self-care   Eating: Self-care   Ambulation: Partial assistance   Toileting: Self-care   Bathing: Self-care       Instrumental Activities of Daily Living   Shopping: Partial assistance   House/Yard Work: Self-care   Administration of medications: Self-care   Finances: Self-care   Telephone: Self-care    Transportation: Self-care      Social Determinants of Health   Financial Resource Strain: Low Risk    Difficulty of Paying Living Expenses: Not hard at all  Food Insecurity: No Food Insecurity   Worried About Charity fundraiser in the Last Year: Never true   Arboriculturist in the Last Year: Never true  Transportation Needs: No Transportation Needs   Lack of Transportation (Medical): No   Lack of Transportation (Non-Medical): No  Physical Activity: Not on file  Stress: Stress Concern Present   Feeling of Stress : Rather much  Social Connections: Moderately Integrated   Frequency of Communication with Friends and Family: More than three times a week   Frequency of Social Gatherings with Friends and Family: Once a week   Attends Religious Services: More than 4 times per year   Active Member of [employer] or Organizations: Yes   Attends Archivist Meetings: Never   Marital Status: Divorced    Current Medications:  Current Outpatient Medications:    atorvastatin (LIPITOR) 20 MG tablet, TAKE 1 TABLET BY MOUTH EVERY DAY (Patient taking differently:  Take 20 mg by mouth daily.), Disp: 34 tablet, Rfl: 11   buPROPion ER (WELLBUTRIN SR) 100 MG 12 hr tablet, Take 100 mg by mouth 2 (two) times daily., Disp: , Rfl:    Calcium Carb-Cholecalciferol (CALCIUM-VITAMIN D) 600-400 MG-UNIT TABS, Take 1 tablet by mouth daily., Disp: , Rfl:    Carboxymethylcellulose Sodium (THERATEARS) 0.25 % SOLN, Place 1 drop into both eyes daily as needed (dry eyes)., Disp: , Rfl:    citalopram (CELEXA) 40 MG tablet, Take 0.5 tablets (20 mg total) by mouth at bedtime. (Patient taking differently: Take 40 mg by mouth daily.), Disp: , Rfl: 0   Continuous Blood Gluc Sensor (FREESTYLE LIBRE 14 DAY SENSOR) MISC, APPLY EVERY 14 DAYS, Disp: 2 each, Rfl: PRN   darifenacin (ENABLEX) 7.5 MG 24 hr tablet, TAKE 1 TABLET BY MOUTH EVERY DAY (Patient taking differently: Take 7.5 mg by mouth daily.), Disp: 90 tablet, Rfl: 3    dicyclomine (BENTYL) 20 MG tablet, Take 1 tablet (20 mg total) by mouth daily as needed for spasms., Disp: 180 tablet, Rfl: 0   fexofenadine (ALLEGRA) 180 MG tablet, Take 180 mg by mouth daily., Disp: , Rfl:    fluticasone (FLONASE) 50 MCG/ACT nasal spray, Place 2 sprays into both nostrils daily., Disp: , Rfl:    hydrochlorothiazide (HYDRODIURIL) 25 MG tablet, TAKE 1 TABLET BY MOUTH EVERY DAY (Patient taking differently: Take 25 mg by mouth daily.), Disp: 90 tablet, Rfl: 3   HYDROcodone-acetaminophen (NORCO) 10-325 MG tablet, Take 1 tablet by mouth every 6 (six) hours as needed., Disp: 30 tablet, Rfl: 0   hydrOXYzine (VISTARIL) 50 MG capsule, Take 150 mg by mouth at bedtime. , Disp: , Rfl:    ketoconazole (NIZORAL) 2 % cream, APPLY TOPICALLY TO AFFECTED AREA EVERY DAY (Patient taking differently: Apply 1 application topically daily as needed for irritation.), Disp: 15 g, Rfl: 0   KLOR-CON M10 10 MEQ tablet, TAKE 4 TABLETS (40 MEQ TOTAL) BY MOUTH AT BEDTIME. (Patient taking differently: Take 40 mEq by mouth at bedtime.), Disp: 360 tablet, Rfl: 3   levothyroxine (SYNTHROID) 125 MCG tablet, TAKE 1 TABLET (125 MCG TOTAL) BY MOUTH EVERY MORNING. 30 MINUTES BEFORE FOOD, Disp: 90 tablet, Rfl: 3   loperamide (IMODIUM A-D) 2 MG capsule, Take 1 capsule (2 mg total) by mouth as needed for diarrhea or loose stools., Disp: 30 capsule, Rfl: 0   LORazepam (ATIVAN) 1 MG tablet, Take 1 mg by mouth 2 (two) times daily as needed for anxiety., Disp: , Rfl:    metoprolol tartrate (LOPRESSOR) 50 MG tablet, TAKE 1 TABLET BY MOUTH TWICE A DAY (Patient taking differently: Take 50 mg by mouth 2 (two) times daily.), Disp: 180 tablet, Rfl: 3   morphine (MS CONTIN) 15 MG 12 hr tablet, Take 1 tablet (15 mg total) by mouth every 12 (twelve) hours., Disp: 60 tablet, Rfl: 0   Multiple Vitamins-Minerals (MULTIVITAMIN WITH MINERALS) tablet, Take 1 tablet by mouth daily., Disp: , Rfl:    omeprazole (PRILOSEC) 40 MG capsule, TAKE 1  CAPSULE BY MOUTH EVERY DAY (Patient taking differently: Take 40 mg by mouth daily.), Disp: 90 capsule, Rfl: 3   polyethylene glycol powder (GLYCOLAX/MIRALAX) 17 GM/SCOOP powder, Take 17 g by mouth as needed for mild constipation., Disp: , Rfl:    prazosin (MINIPRESS) 2 MG capsule, Take 2 mg by mouth at bedtime., Disp: , Rfl:    Probiotic Product (PROBIOTIC DAILY PO), Take 1 capsule by mouth daily., Disp: , Rfl:    prochlorperazine (COMPAZINE)  10 MG tablet, TAKE ONE TABLET BY MOUTH TWICE A DAY AS NEEDED FOR NAUSEA OR VOMITING (Patient taking differently: Take 10 mg by mouth 2 (two) times daily as needed for nausea or vomiting. TAKE ONE TABLET BY MOUTH TWICE A DAY AS NEEDED FOR NAUSEA OR VOMITING), Disp: 30 tablet, Rfl: 1   ramipril (ALTACE) 10 MG capsule, TAKE 1 CAPSULE BY MOUTH EVERY DAY (Patient taking differently: Take 10 mg by mouth daily. TAKE 1 CAPSULE BY MOUTH EVERY DAY), Disp: 90 capsule, Rfl: 3   senna-docusate (SENOKOT-S) 8.6-50 MG tablet, Take 2 tablets by mouth at bedtime. For AFTER surgery, do not take if having diarrhea, Disp: 30 tablet, Rfl: 0   simethicone (MYLICON) 80 MG chewable tablet, Chew 160 mg by mouth daily as needed (gas)., Disp: , Rfl:    tiZANidine (ZANAFLEX) 2 MG tablet, Take 2 mg by mouth at bedtime., Disp: , Rfl:    traZODone (DESYREL) 100 MG tablet, Take 3 tablets (300 mg total) by mouth at bedtime., Disp: , Rfl:    vitamin B-12 (CYANOCOBALAMIN) 1000 MCG tablet, Take 1,000 mcg by mouth daily., Disp: , Rfl:   Review of Systems: Pertinent positives include fatigue, shortness of breath, constipation, back pain, itching, anxiety, depression. Denies appetite changes, fevers, chills, unexplained weight changes. Denies hearing loss, neck lumps or masses, mouth sores, ringing in ears or voice changes. Denies cough or wheezing.   Denies chest pain or palpitations. Denies leg swelling. Denies abdominal distention, pain, blood in stools, diarrhea, nausea, vomiting, or early  satiety. Denies pain with intercourse, dysuria, frequency, hematuria or incontinence. Denies hot flashes, pelvic pain, vaginal bleeding or vaginal discharge.   Denies joint pain or muscle pain/cramps. Denies rash, or wounds. Denies dizziness, headaches, numbness or seizures. Denies swollen lymph nodes or glands, denies easy bruising or bleeding. Denies confusion, or decreased concentration.  Physical Exam: BP 127/69 (BP Location: Left Wrist)   Pulse 64   Temp 97.7 F (36.5 C) (Tympanic)   Resp 18   Ht '5\' 4"'  (1.626 m)   Wt 238 lb 6.4 oz (108.1 kg)   SpO2 95%   BMI 40.92 kg/m  General: Alert, oriented, no acute distress. HEENT: Normocephalic, atraumatic, sclera anicteric. Chest: Unlabored breathing on room air. Abdomen: Obese, soft, nontender.  Normoactive bowel sounds.  No masses or hepatosplenomegaly appreciated.  Well-healing incisions, remaining Dermabond removed as well as mattress sutures. Extremities: Grossly normal range of motion.  Warm, well perfused.    Laboratory & Radiologic Studies: A. OVARY AND FALLOPIAN TUBE, LEFT, SALPINGO OOPHORECTOMY:  - Benign ovary.  - Benign fallopian tube with paratubal cysts.   B. ABDOMINAL LESION, LEFT UPPER, BIOPSY:  - Seborrheic keratosis.   Assessment & Plan: [name] is a 70 y.o. woman with a pathogenic BRCA1 mutation presenting approximately 3 weeks after risk reducing USO.  The patient is doing well from a postoperative stand point and meeting milestones.  We discussed continued activity limitations and postoperative expectations.  Her daughter is having surgery next month and she is looking forward to being able to help her out during recovery.  From a cancer risk standpoint, we discussed that undergoing BSO (patient had previously had 1 tube and ovary removed) significantly reduces her risk of ovarian, fallopian tube, and primary peritoneal carcinoma.  There is likely less than a 4% chance of cancer of the primary  peritoneum.  I do not recommend routine imaging or tumor marker testing moving forward.  I discussed with the patient the importance of seeing  her primary care provider at least yearly and we discussed signs and symptoms that would be concerning for primary peritoneal cancer.  22 minutes of total time was spent for this patient encounter, including preparation, face-to-face counseling with the patient and coordination of care, and documentation of the encounter.  Jeral Pinch, MD  Division of Gynecologic Oncology  Department of Obstetrics and Gynecology  [facility]

## 2021-06-10 NOTE — Patient Instructions (Signed)
You are healing well from surgery!  I would recommend at least 1 more week of no lifting more than 10 pounds.  You can then slowly start increasing your activity.  Please let me know if you need anything in the future.

## 2021-06-14 ENCOUNTER — Telehealth: Payer: Self-pay

## 2021-06-14 NOTE — Telephone Encounter (Signed)
Returned phone call to [place]. Patient asking when she can take a bath. Per [name], NP wait until 4 weeks after surgery, incisions must be closed and no scabs. Patient verbalizes understanding. She states if she still has scab she will wait 5 weeks.

## 2021-06-17 LAB — HM DIABETES EYE EXAM

## 2021-07-04 ENCOUNTER — Encounter: Payer: Self-pay | Admitting: Family Medicine

## 2021-07-04 DIAGNOSIS — G894 Chronic pain syndrome: Secondary | ICD-10-CM

## 2021-07-04 DIAGNOSIS — G2581 Restless legs syndrome: Secondary | ICD-10-CM

## 2021-07-04 DIAGNOSIS — M47896 Other spondylosis, lumbar region: Secondary | ICD-10-CM

## 2021-07-04 DIAGNOSIS — M48061 Spinal stenosis, lumbar region without neurogenic claudication: Secondary | ICD-10-CM

## 2021-07-04 DIAGNOSIS — M160 Bilateral primary osteoarthritis of hip: Secondary | ICD-10-CM

## 2021-07-04 MED ORDER — MORPHINE SULFATE ER 15 MG PO TBCR: 15.0000 mg | EXTENDED_RELEASE_TABLET | Freq: Two times a day (BID) | ORAL | 0 refills | Status: DC

## 2021-07-04 MED ORDER — HYDROCODONE-ACETAMINOPHEN 10-325 MG PO TABS: 1.0000 | ORAL_TABLET | Freq: Four times a day (QID) | ORAL | 0 refills | Status: DC | PRN

## 2021-07-07 ENCOUNTER — Other Ambulatory Visit: Payer: Self-pay | Admitting: Family Medicine

## 2021-07-07 DIAGNOSIS — M797 Fibromyalgia: Secondary | ICD-10-CM

## 2021-07-28 ENCOUNTER — Other Ambulatory Visit: Payer: Self-pay | Admitting: Family Medicine

## 2021-08-07 ENCOUNTER — Encounter: Payer: Self-pay | Admitting: Family Medicine

## 2021-08-07 DIAGNOSIS — G894 Chronic pain syndrome: Secondary | ICD-10-CM

## 2021-08-07 DIAGNOSIS — M48061 Spinal stenosis, lumbar region without neurogenic claudication: Secondary | ICD-10-CM

## 2021-08-07 DIAGNOSIS — M160 Bilateral primary osteoarthritis of hip: Secondary | ICD-10-CM

## 2021-08-07 DIAGNOSIS — M47896 Other spondylosis, lumbar region: Secondary | ICD-10-CM

## 2021-08-07 DIAGNOSIS — G2581 Restless legs syndrome: Secondary | ICD-10-CM

## 2021-08-08 MED ORDER — MORPHINE SULFATE ER 15 MG PO TBCR: 15.0000 mg | EXTENDED_RELEASE_TABLET | Freq: Two times a day (BID) | ORAL | 0 refills | Status: DC

## 2021-08-08 MED ORDER — HYDROCODONE-ACETAMINOPHEN 10-325 MG PO TABS: 1.0000 | ORAL_TABLET | Freq: Four times a day (QID) | ORAL | 0 refills | Status: DC | PRN

## 2021-08-11 ENCOUNTER — Other Ambulatory Visit: Payer: Self-pay | Admitting: Family Medicine

## 2021-08-11 DIAGNOSIS — K580 Irritable bowel syndrome with diarrhea: Secondary | ICD-10-CM

## 2021-08-23 ENCOUNTER — Encounter: Payer: Self-pay | Admitting: Family Medicine

## 2021-08-23 DIAGNOSIS — N3946 Mixed incontinence: Secondary | ICD-10-CM

## 2021-08-25 MED ORDER — SOLIFENACIN SUCCINATE 5 MG PO TABS: 5.0000 mg | ORAL_TABLET | Freq: Every day | ORAL | 3 refills | Status: DC

## 2021-09-01 ENCOUNTER — Other Ambulatory Visit: Payer: Self-pay

## 2021-09-01 ENCOUNTER — Encounter: Payer: Self-pay | Admitting: Family Medicine

## 2021-09-01 ENCOUNTER — Ambulatory Visit (INDEPENDENT_AMBULATORY_CARE_PROVIDER_SITE_OTHER): Payer: PPO | Admitting: Family Medicine

## 2021-09-01 VITALS — BP 135/82 | HR 60 | Ht 64.0 in | Wt 250.0 lb

## 2021-09-01 DIAGNOSIS — N3946 Mixed incontinence: Secondary | ICD-10-CM

## 2021-09-01 DIAGNOSIS — E1159 Type 2 diabetes mellitus with other circulatory complications: Secondary | ICD-10-CM | POA: Diagnosis not present

## 2021-09-01 DIAGNOSIS — I152 Hypertension secondary to endocrine disorders: Secondary | ICD-10-CM | POA: Diagnosis not present

## 2021-09-01 DIAGNOSIS — E119 Type 2 diabetes mellitus without complications: Secondary | ICD-10-CM | POA: Diagnosis not present

## 2021-09-01 LAB — POCT GLYCOSYLATED HEMOGLOBIN (HGB A1C): HbA1c, POC (controlled diabetic range): 6 % (ref 0.0–7.0)

## 2021-09-01 MED ORDER — OZEMPIC (0.25 OR 0.5 MG/DOSE) 2 MG/1.5ML ~~LOC~~ SOPN: 0.5000 mg | PEN_INJECTOR | SUBCUTANEOUS | 1 refills | Status: DC

## 2021-09-01 NOTE — Progress Notes (Signed)
a1c

## 2021-09-01 NOTE — Patient Instructions (Signed)
Your blood sugar is under excellent control.  You are considered to be in partial remission.  If you continue to have good control through the end of January, you will be in full remission.   Ozempic (semaglutide) is an injectable medication for diabetes that can also help with weight loss.  It does not cause low blood sugars like insulin.    You start Ozempic at a low dose, 0.25 mg dose each week, then increase in 4 weeks to 0.5 mg injection dose each week for 4 weeks, then using a higher concentration pen, 1 mg pen, to take 1 mg injections weekly.  I am glad you are using accountability and specific measurable goals to improve your health.  This is approach is in keeping with the best current science of making personal changes.   A referral to Dr Bjorn Loser [facility]) was sent.  Please let our office know if you have not heard from his office within 5 working days.

## 2021-09-01 NOTE — Progress Notes (Addendum)
   [name] is alone Sources of clinical information for visit is/are patient and past medical records. Nursing assessment for this office visit was reviewed with the patient for accuracy and revision.     Previous Report(s) Reviewed: PDMP DB  Depression screen PHQ 2/9 09/01/2021  Decreased Interest 1  Down, Depressed, Hopeless 1  PHQ - 2 Score 2  Altered sleeping 1  Tired, decreased energy 2  Change in appetite 1  Feeling bad or failure about yourself  0  Trouble concentrating 1  Moving slowly or fidgety/restless 0  Suicidal thoughts 0  PHQ-9 Score 7  Difficult doing work/chores Somewhat difficult  Some recent data might be hidden    Fall Risk  09/01/2021 04/28/2021 03/03/2021 12/02/2020 09/16/2020  Falls in the past year? 0 0 0 0 0  Number falls in past yr: 0 0 0 0 -  Injury with Fall? 0 0 0 0 -  Risk Factor Category  - - - - -  Risk for fall due to : - - - Impaired balance/gait;Impaired mobility -  Follow up - - - Falls evaluation completed -    PHQ9 SCORE ONLY 09/01/2021 06/02/2021 04/28/2021  PHQ-9 Total Score 7 14 16     Adult vaccines due  Topic Date Due   TETANUS/TDAP  10/21/2023    Health Maintenance Due  Topic Date Due   OPHTHALMOLOGY EXAM  08/26/2021      History/P.E. limitations: none  Adult vaccines due  Topic Date Due   TETANUS/TDAP  10/21/2023    Diabetes Health Maintenance Due  Topic Date Due   OPHTHALMOLOGY EXAM  08/26/2021   FOOT EXAM  09/16/2021   HEMOGLOBIN A1C  03/02/2022    Health Maintenance Due  Topic Date Due   OPHTHALMOLOGY EXAM  08/26/2021     Chief Complaint  Patient presents with   Follow up    Visit Problem List with A/P  Hypertension associated with diabetes ([name]) Established problem Well Controlled. No signs of complications, medication side effects, or red flags. Continue current medications and other regiments.   Urinary incontinence, mixed Established problem Uncontrolled urgency incontinencewith  Vesicare.  Unable to afford Mybetriq which was more effective than Vesicare.  [name] wants to discuss surgical options to treat her stress incontinence.  Referral to [facility] for evaluation and treatment stress and urge incontinence.     CPT E&M Office Visit Time Before Visit; reviewing medical records (e.g. recent visits, labs, studies): 5 minutes During Visit (F2F time): 20 minutes After Visit (discussion with family or HCP, prescribing, ordering, referring, calling result/recommendations or documenting on same day): 15 minutes Total Visit Time: 30 minutes

## 2021-09-05 ENCOUNTER — Encounter: Payer: Self-pay | Admitting: Family Medicine

## 2021-09-05 DIAGNOSIS — M160 Bilateral primary osteoarthritis of hip: Secondary | ICD-10-CM

## 2021-09-05 DIAGNOSIS — G894 Chronic pain syndrome: Secondary | ICD-10-CM

## 2021-09-05 DIAGNOSIS — M47896 Other spondylosis, lumbar region: Secondary | ICD-10-CM

## 2021-09-05 DIAGNOSIS — M48061 Spinal stenosis, lumbar region without neurogenic claudication: Secondary | ICD-10-CM

## 2021-09-05 DIAGNOSIS — G2581 Restless legs syndrome: Secondary | ICD-10-CM

## 2021-09-06 ENCOUNTER — Encounter: Payer: Self-pay | Admitting: Family Medicine

## 2021-09-06 MED ORDER — HYDROCODONE-ACETAMINOPHEN 10-325 MG PO TABS: 1.0000 | ORAL_TABLET | Freq: Four times a day (QID) | ORAL | 0 refills | Status: DC | PRN

## 2021-09-06 MED ORDER — MORPHINE SULFATE ER 15 MG PO TBCR: 15.0000 mg | EXTENDED_RELEASE_TABLET | Freq: Two times a day (BID) | ORAL | 0 refills | Status: DC

## 2021-09-06 NOTE — Telephone Encounter (Signed)
Patient went ahead and cancelled her appt for 09/22/21 with pcp since she wasn't picking up the ozempic.  She will plan to see McDiarmid in 3 months.  Will forward to MD as an FYI.  Neev Mcmains,CMA

## 2021-09-09 ENCOUNTER — Encounter: Payer: Self-pay | Admitting: Family Medicine

## 2021-09-09 NOTE — Assessment & Plan Note (Signed)
Established problem Well Controlled. No signs of complications, medication side effects, or red flags. Continue current medications and other regiments.  

## 2021-09-09 NOTE — Assessment & Plan Note (Signed)
Established problem Uncontrolled urgency incontinencewith Vesicare.  Unable to afford Mybetriq which was more effective than Vesicare.  [name] wants to discuss surgical options to treat her stress incontinence.  Referral to [facility] for evaluation and treatment stress and urge incontinence.

## 2021-09-18 ENCOUNTER — Encounter: Payer: Self-pay | Admitting: Family Medicine

## 2021-09-19 DIAGNOSIS — F411 Generalized anxiety disorder: Secondary | ICD-10-CM | POA: Diagnosis not present

## 2021-09-19 DIAGNOSIS — F319 Bipolar disorder, unspecified: Secondary | ICD-10-CM | POA: Diagnosis not present

## 2021-09-21 DIAGNOSIS — H6691 Otitis media, unspecified, right ear: Secondary | ICD-10-CM | POA: Diagnosis not present

## 2021-09-21 DIAGNOSIS — J019 Acute sinusitis, unspecified: Secondary | ICD-10-CM | POA: Diagnosis not present

## 2021-09-21 DIAGNOSIS — R059 Cough, unspecified: Secondary | ICD-10-CM | POA: Diagnosis not present

## 2021-09-21 DIAGNOSIS — J3489 Other specified disorders of nose and nasal sinuses: Secondary | ICD-10-CM | POA: Diagnosis not present

## 2021-09-22 ENCOUNTER — Ambulatory Visit: Payer: PPO | Admitting: Family Medicine

## 2021-09-26 ENCOUNTER — Encounter: Payer: Self-pay | Admitting: Family Medicine

## 2021-09-26 DIAGNOSIS — B3731 Acute candidiasis of vulva and vagina: Secondary | ICD-10-CM

## 2021-09-26 MED ORDER — FLUCONAZOLE 150 MG PO TABS: 150.0000 mg | ORAL_TABLET | Freq: Once | ORAL | 0 refills | Status: AC

## 2021-09-26 NOTE — Telephone Encounter (Signed)
Rx Diflucan 150 mg by mouth once for possible yeast vaginitis

## 2021-09-27 DIAGNOSIS — J019 Acute sinusitis, unspecified: Secondary | ICD-10-CM | POA: Diagnosis not present

## 2021-10-02 ENCOUNTER — Other Ambulatory Visit: Payer: Self-pay | Admitting: Family Medicine

## 2021-10-02 DIAGNOSIS — I10 Essential (primary) hypertension: Secondary | ICD-10-CM

## 2021-10-03 DIAGNOSIS — Z09 Encounter for follow-up examination after completed treatment for conditions other than malignant neoplasm: Secondary | ICD-10-CM | POA: Diagnosis not present

## 2021-10-03 DIAGNOSIS — Z8709 Personal history of other diseases of the respiratory system: Secondary | ICD-10-CM | POA: Diagnosis not present

## 2021-10-06 ENCOUNTER — Encounter: Payer: Self-pay | Admitting: Family Medicine

## 2021-10-06 DIAGNOSIS — M48061 Spinal stenosis, lumbar region without neurogenic claudication: Secondary | ICD-10-CM

## 2021-10-06 DIAGNOSIS — G894 Chronic pain syndrome: Secondary | ICD-10-CM

## 2021-10-06 DIAGNOSIS — M160 Bilateral primary osteoarthritis of hip: Secondary | ICD-10-CM

## 2021-10-06 DIAGNOSIS — M47896 Other spondylosis, lumbar region: Secondary | ICD-10-CM

## 2021-10-06 DIAGNOSIS — G2581 Restless legs syndrome: Secondary | ICD-10-CM

## 2021-10-06 MED ORDER — HYDROCODONE-ACETAMINOPHEN 10-325 MG PO TABS: 1.0000 | ORAL_TABLET | Freq: Four times a day (QID) | ORAL | 0 refills | Status: DC | PRN

## 2021-10-06 MED ORDER — MORPHINE SULFATE ER 15 MG PO TBCR: 15.0000 mg | EXTENDED_RELEASE_TABLET | Freq: Two times a day (BID) | ORAL | 0 refills | Status: DC

## 2021-10-18 DIAGNOSIS — E119 Type 2 diabetes mellitus without complications: Secondary | ICD-10-CM | POA: Diagnosis not present

## 2021-11-01 ENCOUNTER — Encounter: Payer: Self-pay | Admitting: Family Medicine

## 2021-11-01 DIAGNOSIS — E785 Hyperlipidemia, unspecified: Secondary | ICD-10-CM

## 2021-11-01 DIAGNOSIS — M160 Bilateral primary osteoarthritis of hip: Secondary | ICD-10-CM

## 2021-11-01 DIAGNOSIS — M48061 Spinal stenosis, lumbar region without neurogenic claudication: Secondary | ICD-10-CM

## 2021-11-01 DIAGNOSIS — G2581 Restless legs syndrome: Secondary | ICD-10-CM

## 2021-11-01 DIAGNOSIS — M47896 Other spondylosis, lumbar region: Secondary | ICD-10-CM

## 2021-11-01 DIAGNOSIS — G894 Chronic pain syndrome: Secondary | ICD-10-CM

## 2021-11-02 DIAGNOSIS — N3946 Mixed incontinence: Secondary | ICD-10-CM | POA: Diagnosis not present

## 2021-11-02 DIAGNOSIS — R350 Frequency of micturition: Secondary | ICD-10-CM | POA: Diagnosis not present

## 2021-11-02 MED ORDER — ATORVASTATIN CALCIUM 20 MG PO TABS: 20.0000 mg | ORAL_TABLET | Freq: Every day | ORAL | 3 refills | Status: DC

## 2021-11-02 MED ORDER — MORPHINE SULFATE ER 15 MG PO TBCR: 15.0000 mg | EXTENDED_RELEASE_TABLET | Freq: Two times a day (BID) | ORAL | 0 refills | Status: DC

## 2021-11-02 MED ORDER — HYDROCODONE-ACETAMINOPHEN 10-325 MG PO TABS: 1.0000 | ORAL_TABLET | Freq: Four times a day (QID) | ORAL | 0 refills | Status: DC | PRN

## 2021-11-04 ENCOUNTER — Ambulatory Visit: Payer: PPO | Admitting: Family Medicine

## 2021-11-14 ENCOUNTER — Encounter: Payer: Self-pay | Admitting: Family Medicine

## 2021-11-14 NOTE — Progress Notes (Unsigned)
Summary initial consultation with Dr Bjorn Loser [facility]) for incontinence, 11/02/21  A/  Mixed urinary incontinence - Well-supported bladder neck and negative cough test. No prolapse.  PVR 0 mL.  P/ Urodynamics  Urine cath cx

## 2021-11-15 ENCOUNTER — Ambulatory Visit: Payer: PPO | Admitting: Family Medicine

## 2021-11-21 DIAGNOSIS — N3946 Mixed incontinence: Secondary | ICD-10-CM | POA: Diagnosis not present

## 2021-11-29 DIAGNOSIS — F3181 Bipolar II disorder: Secondary | ICD-10-CM | POA: Diagnosis not present

## 2021-11-29 DIAGNOSIS — F4312 Post-traumatic stress disorder, chronic: Secondary | ICD-10-CM | POA: Diagnosis not present

## 2021-12-01 ENCOUNTER — Encounter: Payer: Self-pay | Admitting: Family Medicine

## 2021-12-01 ENCOUNTER — Ambulatory Visit (INDEPENDENT_AMBULATORY_CARE_PROVIDER_SITE_OTHER): Payer: PPO | Admitting: Family Medicine

## 2021-12-01 ENCOUNTER — Other Ambulatory Visit: Payer: Self-pay

## 2021-12-01 VITALS — BP 117/48 | HR 57 | Ht 64.0 in | Wt 256.4 lb

## 2021-12-01 DIAGNOSIS — G2581 Restless legs syndrome: Secondary | ICD-10-CM | POA: Diagnosis not present

## 2021-12-01 DIAGNOSIS — I700 Atherosclerosis of aorta: Secondary | ICD-10-CM

## 2021-12-01 DIAGNOSIS — F3181 Bipolar II disorder: Secondary | ICD-10-CM

## 2021-12-01 DIAGNOSIS — M48061 Spinal stenosis, lumbar region without neurogenic claudication: Secondary | ICD-10-CM

## 2021-12-01 DIAGNOSIS — K929 Disease of digestive system, unspecified: Secondary | ICD-10-CM

## 2021-12-01 DIAGNOSIS — G894 Chronic pain syndrome: Secondary | ICD-10-CM | POA: Diagnosis not present

## 2021-12-01 DIAGNOSIS — E039 Hypothyroidism, unspecified: Secondary | ICD-10-CM

## 2021-12-01 DIAGNOSIS — E1159 Type 2 diabetes mellitus with other circulatory complications: Secondary | ICD-10-CM | POA: Diagnosis not present

## 2021-12-01 DIAGNOSIS — M160 Bilateral primary osteoarthritis of hip: Secondary | ICD-10-CM | POA: Diagnosis not present

## 2021-12-01 DIAGNOSIS — M47896 Other spondylosis, lumbar region: Secondary | ICD-10-CM

## 2021-12-01 DIAGNOSIS — E119 Type 2 diabetes mellitus without complications: Secondary | ICD-10-CM | POA: Diagnosis not present

## 2021-12-01 DIAGNOSIS — I152 Hypertension secondary to endocrine disorders: Secondary | ICD-10-CM

## 2021-12-01 LAB — POCT GLYCOSYLATED HEMOGLOBIN (HGB A1C): HbA1c, POC (controlled diabetic range): 6.4 % (ref 0.0–7.0)

## 2021-12-01 NOTE — Patient Instructions (Signed)
Your blood pressure and blood sugars are under excellent control.   Your diabetes is considered in full remission now.  Keep up the good work  Your going to [employer] is a great idea.    Use those video from [employer] as a way to transition to going to the gym.

## 2021-12-02 ENCOUNTER — Encounter: Payer: Self-pay | Admitting: Family Medicine

## 2021-12-02 LAB — TSH: TSH: 0.41 u[IU]/mL — ABNORMAL LOW (ref 0.450–4.500)

## 2021-12-02 MED ORDER — MORPHINE SULFATE ER 15 MG PO TBCR: 15.0000 mg | EXTENDED_RELEASE_TABLET | Freq: Two times a day (BID) | ORAL | 0 refills | Status: DC

## 2021-12-02 MED ORDER — HYDROCODONE-ACETAMINOPHEN 10-325 MG PO TABS: 1.0000 | ORAL_TABLET | Freq: Four times a day (QID) | ORAL | 0 refills | Status: DC | PRN

## 2021-12-02 NOTE — Assessment & Plan Note (Signed)
Established problem. Stable. Rarely needing Compazine.  She has stopped Bentyl

## 2021-12-02 NOTE — Assessment & Plan Note (Signed)
Established problem Controlled pain which allows completion of ADLs and iADLs No adverse effects No aberrant behaviors Continue current therapy regiment. 

## 2021-12-02 NOTE — Progress Notes (Signed)
[name] is alone Sources of clinical information for visit is/are patient. Nursing assessment for this office visit was reviewed with the patient for accuracy and revision.     Previous Report(s) Reviewed: Urology office visit notes  Depression screen [facility] 2/9 12/01/2021  Decreased Interest 2  Down, Depressed, Hopeless 1  PHQ - 2 Score 3  Altered sleeping 2  Tired, decreased energy 3  Change in appetite 2  Feeling bad or failure about yourself  1  Trouble concentrating 1  Moving slowly or fidgety/restless 0  Suicidal thoughts 0  PHQ-9 Score 12  Difficult doing work/chores Very difficult  Some recent data might be hidden   Physiological scientist Office Visit from 12/01/2021 in [place] Office Visit from 09/01/2021 in [place] Office Visit from 06/02/2021 in [place]  Thoughts that you would be better off dead, or of hurting yourself in some way Not at all Not at all Not at all  PHQ-9 Total Score 12 7 14        Fall Risk  12/01/2021 09/01/2021 04/28/2021 03/03/2021 12/02/2020  Falls in the past year? 0 0 0 0 0  Number falls in past yr: 0 0 0 0 0  Injury with Fall? 0 0 0 0 0  Risk Factor Category  - - - - -  Risk for fall due to : - - - - Impaired balance/gait;Impaired mobility  Follow up - - - - Falls evaluation completed    PHQ9 SCORE ONLY 12/01/2021 09/01/2021 06/02/2021  PHQ-9 Total Score 12 7 14     Adult vaccines due  Topic Date Due   TETANUS/TDAP  10/21/2023    Health Maintenance Due  Topic Date Due   FOOT EXAM  09/16/2021   COVID-19 Vaccine (6 - Booster for [employer] series) 09/21/2021      History/P.E. limitations: none  Adult vaccines due  Topic Date Due   TETANUS/TDAP  10/21/2023    Diabetes Health Maintenance Due  Topic Date Due   FOOT EXAM  09/16/2021   HEMOGLOBIN A1C  05/31/2022   OPHTHALMOLOGY EXAM  06/17/2022    Health Maintenance Due  Topic Date Due   FOOT EXAM  09/16/2021    COVID-19 Vaccine (6 - Booster for [place] series) 09/21/2021     Chief Complaint  Patient presents with   Follow-up

## 2021-12-02 NOTE — Assessment & Plan Note (Signed)
Established problem Stable. No claudication.   BP, A1c, and Cholesterol controlled.  Continue current mngt

## 2021-12-02 NOTE — Assessment & Plan Note (Signed)
Established problem Well Controlled. No signs of complications, medication side effects, or red flags. Continue current medications and other regiments.  

## 2021-12-02 NOTE — Assessment & Plan Note (Signed)
Established problem Well Controlled. No signs of complications, medication side effects, or red flags. Continue on lifestyle mngt

## 2021-12-02 NOTE — Assessment & Plan Note (Signed)
Established problem worsened.  Her complaint of-insurance payment for GLP-1 meds are out of her range currently.  She will start using the [employer] video to increase her activity.  Perhaps starting to go to the [employer] gymp programs.

## 2021-12-02 NOTE — Assessment & Plan Note (Signed)
Lab Results  Component Value Date   TSH 0.410 (L) 12/01/2021  Slightly low TSH Will recheck TSH at next office visit in 3 months Continue levothyroxine 125 mg daily

## 2021-12-09 DIAGNOSIS — N3946 Mixed incontinence: Secondary | ICD-10-CM | POA: Diagnosis not present

## 2021-12-09 DIAGNOSIS — R350 Frequency of micturition: Secondary | ICD-10-CM | POA: Diagnosis not present

## 2021-12-24 ENCOUNTER — Other Ambulatory Visit: Payer: Self-pay | Admitting: Family Medicine

## 2021-12-24 DIAGNOSIS — E876 Hypokalemia: Secondary | ICD-10-CM

## 2021-12-24 DIAGNOSIS — I10 Essential (primary) hypertension: Secondary | ICD-10-CM

## 2021-12-24 DIAGNOSIS — E1169 Type 2 diabetes mellitus with other specified complication: Secondary | ICD-10-CM

## 2021-12-24 DIAGNOSIS — K219 Gastro-esophageal reflux disease without esophagitis: Secondary | ICD-10-CM

## 2021-12-24 DIAGNOSIS — E669 Obesity, unspecified: Secondary | ICD-10-CM

## 2022-01-03 ENCOUNTER — Other Ambulatory Visit: Payer: Self-pay | Admitting: Family Medicine

## 2022-01-03 DIAGNOSIS — M797 Fibromyalgia: Secondary | ICD-10-CM

## 2022-01-04 ENCOUNTER — Encounter: Payer: Self-pay | Admitting: Family Medicine

## 2022-01-04 DIAGNOSIS — M48061 Spinal stenosis, lumbar region without neurogenic claudication: Secondary | ICD-10-CM

## 2022-01-04 DIAGNOSIS — G2581 Restless legs syndrome: Secondary | ICD-10-CM

## 2022-01-04 DIAGNOSIS — G894 Chronic pain syndrome: Secondary | ICD-10-CM

## 2022-01-04 DIAGNOSIS — M47896 Other spondylosis, lumbar region: Secondary | ICD-10-CM

## 2022-01-04 DIAGNOSIS — M160 Bilateral primary osteoarthritis of hip: Secondary | ICD-10-CM

## 2022-01-04 MED ORDER — MORPHINE SULFATE ER 15 MG PO TBCR: 15.0000 mg | EXTENDED_RELEASE_TABLET | Freq: Two times a day (BID) | ORAL | 0 refills | Status: DC

## 2022-01-04 MED ORDER — HYDROCODONE-ACETAMINOPHEN 10-325 MG PO TABS: 1.0000 | ORAL_TABLET | Freq: Four times a day (QID) | ORAL | 0 refills | Status: DC | PRN

## 2022-01-11 ENCOUNTER — Other Ambulatory Visit: Payer: Self-pay

## 2022-01-11 ENCOUNTER — Ambulatory Visit (INDEPENDENT_AMBULATORY_CARE_PROVIDER_SITE_OTHER): Payer: PPO

## 2022-01-11 VITALS — Ht 64.0 in | Wt 262.0 lb

## 2022-01-11 DIAGNOSIS — Z0000 Encounter for general adult medical examination without abnormal findings: Secondary | ICD-10-CM | POA: Diagnosis not present

## 2022-01-11 NOTE — Progress Notes (Signed)
Subjective:   [name] is a 71 y.o. female who presents for Medicare Annual (Subsequent) preventive examination.  Patient consented to have virtual visit and was identified by name and date of birth. Method of visit: Telephone  Encounter participants: Patient: [name] - located at Home Nurse/Provider: Dorna Bloom - located at [facility] Others (if applicable): NA  Review of Systems: Defer to PCP.  Cardiac Risk Factors include: advanced age (>34mn, >>41women);diabetes mellitus;dyslipidemia;hypertension;obesity (BMI >30kg/m2);sedentary lifestyle  Objective:   Vitals: Ht _0  (1.626 m)    Wt 262 lb (118.8 kg)    BMI 44.97 kg/m   Body mass index is 44.97 kg/m.  Advanced Directives 01/11/2022 12/01/2021 09/01/2021 06/02/2021 05/10/2021 05/06/2021 04/28/2021  Does Patient Have a Medical Advance Directive? Yes No No Yes Yes Yes Yes  Type of [place]aramed[place]of [place] will - - Living will Healthcare Power of [place] will Living will  Does patient want to make changes to medical advance directive? No - Patient declined - - No - Patient declined - No - Patient declined No - Patient declined  Copy of [place] Chart? Yes - validated most recent copy scanned in chart (See row information) - - Yes - validated most recent copy scanned in chart (See row information) Yes - validated most recent copy scanned in chart (See row information) Yes - validated most recent copy scanned in chart (See row information) -  Would patient like information on creating a medical advance directive? No - Patient declined No - Patient declined No - Patient declined No - Patient declined - - No - Patient declined   Tobacco Social History   Tobacco Use  Smoking Status Former   Packs/day: 1.50   Years: 44.00   Pack years: 66.00   Types: Cigarettes   Quit date: 04/06/2008   Years since quitting: 13.7   Passive exposure: Past   Smokeless Tobacco Never     Counseling given: No plans to restart.  Clinical Intake:  Pre-visit preparation completed: Yes  Nutritional Status: BMI > 30  Obese Diabetes: Yes  How often do you need to have someone help you when you read instructions, pamphlets, or other written materials from your doctor or pharmacy?: 2 - Rarely What is the last grade level you completed in school?: High School  Past Medical History:  Diagnosis Date   Adrenal adenoma, left 12/02/2020   CT AP 11/16/20:  left adrenal mass measuring approximately 2.7 cm. This is minimally increased in size from prior study and is consistent with a benign adrenal adenoma.    Adrenal nodule ([place] 04/08/2011   04/08/11 Abdominal CT showed Left adrenal nodule which is tachnically indeterminate but most likely an adenoma.  It is 1.7 cm and 42 HU on portal venous phase image. 31 HU on delayed images.   01/09/2012 Abdominal CT w/ & w/o CM: Stable benign left adrenal adenoma. No further specific follow-up is needed for this finding.     Allergic rhinitis 01/03/2007   Qualifier: Diagnosis of  By: KDrucie Ip    Allergy    Anemia    hx of  in 20's   Anxiety    Bipolar disorder ([place]    Carpal tunnel syndrome 02/14/2007   S/P carpel tunnel release bilaterally.     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 03/28/2018 03/28/2018   COPD WITHOUT EXACERBATION 01/03/2007   Qualifier: Diagnosis of  By: Drucie Ip  emphysema   Diabetes mellitus without complication ([place])    B9T 6.5 as of 05-2019- diet changes, no meds currently    DISC WITH RADICULOPATHY 01/03/2007   Qualifier: Diagnosis of  By: Drucie Ip     Emphysema of lung ([place])    Esophageal reflux 01/03/2007   Centricity Description: GASTROESOPHAGEAL REFLUX, NO ESOPHAGITIS Qualifier: Diagnosis of  By: Drucie Ip   Centricity Description: GERD  Qualifier: Diagnosis of  By: Tye Savoy MD, [place]     External hemorrhoids s/p hemorrhoidectomy 03/28/2018 03/28/2018   Family history of breast cancer    Family history of cancer of female genital organ    Family history of colon cancer    Family history of gene mutation    BRIP1 gene mutation (c.2010dup) in daughter   Family history of lung cancer    Fibromyalgia    Fracture of bone spur of inferior portion of calcaneus 10/06/2018   Functional gastrointestinal disorder 06/18/2008   Functional Gastrointestinal Disorder with frequent eructation and bloating sensation with acute flares.  Responsive to Compazine and Bentyl.  Takes a few days to calm down.     GERD without esophagitis 01/03/2007   Centricity Description: GASTROESOPHAGEAL REFLUX, NO ESOPHAGITIS Qualifier: Diagnosis of  By: Drucie Ip   Centricity Description: GERD Qualifier: Diagnosis of  By: Tye Savoy MD, [place]     Hearing impairment 04/28/2021   Heel spur, fracture, left 04/17/2018   HEMORRHOIDS, NOS 01/03/2007   Qualifier: Diagnosis of  By: Drucie Ip     Hepatic steatosis 04/12/2011   Herpes simplex labialis 05/11/2011   Hip osteoarthritis 05/23/2007   MRI Pelvis (03/13/07) Bilateral osteoarthritis of the hips, left greater than right.  The  patient has a slightly more prominent hip effusion on the left than  the right.  No other significant abnormality.     History of MRSA infection 04/28/2011   HYPERCHOLESTEROLEMIA 12/24/2008   Hypertension    HYPERTENSION, BENIGN SYSTEMIC 01/03/2007   Qualifier: Diagnosis of  By: Drucie Ip     HYPERTRIGLYCERIDEMIA 11/11/2007   Qualifier: Diagnosis of  By: McDiarmid MD, [place]     Hypokalemia 12/12/2017   Hypothyroidism 02/15/2007   Qualifier: Diagnosis of  By: McDiarmid MD, Jannifer Franklin, BORDERLINE 02/15/2007   Insomnia 03/03/2021   Intra-abdominal adhesions    Irritable bowel syndrome 01/03/2007   Qualifier: Diagnosis of  By: Drucie Ip     LACTOSE  INTOLERANCE 03/10/2008   Major depressive disorder, recurrent episode ([place]) 01/03/2007   Qualifier: Diagnosis of  By: Drucie Ip     Medication management 12/14/2016   Managing topiramate for weight loss starting 12/14/16   Memory difficulties 90/30/0923   Monoallelic mutation of BRIP1 gene 01/17/2021   c.2010dup (p.[id number]*)   No impairment of memory 04/29/2021   Obesity (BMI 30.0-34.9) 09/30/2007   Has lost 55 lbs since easter. Goal of <190 by 08/05/12.      Obesity, Class III, BMI 40-49.9 (morbid obesity) ([place]) 09/30/2007        Osteoarthritis of spine 01/03/2007   MR I Lumbar (08/03): GeneralDDD; L2-3 Large  HNP.  Mod pos  hnp - 12/14/2005, smhnpw/irritL4root - 12/14/2005     OSTEOARTHRITIS, HANDS, BILATERAL 06/18/2008   Qualifier: Diagnosis of  By: McDiarmid MD, Kristeen Miss FEMORAL STRESS SYNDROME 01/03/2007   Qualifier: Diagnosis of  By: Drucie Ip  Periodic limb movements of sleep 12/05/2010   Diagnosed on Polysomnography testing Fall 2011.      Pneumonia    walking pneumonia in the 90's   Pre-diabetes 12/25/2008   Qualifier: Diagnosis of  By: McDiarmid MD, [place]     Prolapsed internal hemorrhoids, grade 3, s/p ligation/pexy/hemorrhoidectomy 03/28/2018 03/28/2018   RESTLESS LEGS SYNDROME 09/11/2007   Polysomnography (10/2010, Dr Keturah Barre, interpreter. ) showed periodic limb movements that frequently awoke patient from sleep with arousal index of 4 per hour. Dr Danton Sewer (Sleep Specialist) thought her RLS was the major contributor to patient poor sleep condition, more than any sleep apnea.  He recommended considering opamine agonist (either requip or mirapex) to see if this will help.      Rosacea, acne 10/24/2011   SIGMOID POLYP 01/23/2008   Solitary lung nodule 04/08/2011   Stage 1 mild COPD by GOLD classification ([place]) 01/03/2007   11/28/2017 Chest CT: Mild centrilobular and paraseptal emphysema with mild diffuse bronchial wall thickening     Stress incontinence     Synovial cyst of lumbar facet joint 02/17/2016   Trigger thumb of left hand 08/16/2018   Urticaria, chronic 05/22/2011   VITAMIN D DEFICIENCY 03/03/2010   Work-related condition 05/17/2018   Past Surgical History:  Procedure Laterality Date   APPENDECTOMY     bladder tack  1998   Dr Jeffie Pollock (urology)   BUNIONECTOMY  1992   bilateral feet with pins in great toes   [place]  2010   Bilateral, Dr Herbie Baltimore Sypher   COLONOSCOPY  2004   Dr Verdia Kuba (GI)   CYSTOCELE REPAIR  1998    Dr Gertie Fey (GYN)-   ESOPHAGOGASTRODUODENOSCOPY     EVALUATION UNDER ANESTHESIA WITH HEMORRHOIDECTOMY N/A 03/28/2018   Procedure: ANORECTAL EXAM UNDER ANESTHESIA lateral internal partial sphincterotomy, hemorrhoidal ligation pixie, HEMORRHOIDECTOMY;  Surgeon: Michael Boston, MD;  Location: [facility];  Service: General;  Laterality: N/A;   EXAM ANORECTAL W/ ULTRASOUND     Dr. Johney Maine 03-28-18    EXPLORATORY LAPAROTOMY     FACIAL LACERATIONS REPAIR     Facial Laceration repair as adolescent    INJECTION HIP INTRA ARTICULAR  2008   Left Hip fluoroscopically-guided corticosteorid injection for painful osteoarthritis with good effect (06/2007)   LAPAROSCOPIC LYSIS OF ADHESIONS  05/19/2021   Procedure: LAPAROSCOPIC LYSIS OF ADHESIONS; BIOPSY ABDOMINAL SKIN LESION;  Surgeon: Lafonda Mosses, MD;  Location: [facility];  Service: Gynecology;;   LAPAROSCOPY FOR ECTOPIC PREGNANCY     LUMBAR LAMINECTOMY/DECOMPRESSION MICRODISCECTOMY N/A 06/21/2016   Procedure: MICRO LUMBAR DECOMPRESSION L4-L5 AND REMOVAL OF SYNOVIAL CYST    1 LEVEL;  Surgeon: Susa Day, MD;  Location: [facility];  Service: Orthopedics;  Laterality: N/A;   MRSA     NM MYOVIEW LTD  2011   Dr Daneen Schick, III (Card)   RECTOCELE REPAIR  1998   Dr Gertie Fey (GYN)   ROBOTIC ASSISTED SALPINGO OOPHERECTOMY N/A 05/19/2021   Procedure: XI ROBOTIC ASSISTED LEFT SALPINGO- OOPHORECTOMY WITH PELVIC WASHINGS;  Surgeon: Lafonda Mosses,  MD;  Location: [facility];  Service: Gynecology;  Laterality: N/A;   SPHINCTEROTOMY N/A 03/28/2018   Procedure: ANAL SPHINCTEROTOMY;  Surgeon: Michael Boston, MD;  Location: [facility];  Service: General;  Laterality: N/A;   UPPER GASTROINTESTINAL ENDOSCOPY     Family History  Problem Relation Age of Onset   Hypertension Mother    Frontotemporal dementia Mother    Diabetes Father  Heart disease Father    Hypertension Sister    Obesity Sister    Kidney disease Brother    Heart disease Brother    Hypertension Brother    Hypertension Daughter    Bipolar disorder Daughter    Hypertension Daughter    Breast cancer Daughter 70   Cancer Daughter 32       vulvar cancer   Other Daughter        BRIP1 gene mutation c.2010dup   Thyroid disease Daughter    Hypertension Son    Hypertension Son    Colon polyps Son 83   Diabetes Maternal Aunt    Lung cancer Paternal Aunt        d. >50   Cancer Paternal Aunt        unknown type, dx >50   Heart Problems Paternal Grandmother    Diabetes Paternal Grandfather    Cancer Cousin 17       gynecologic cancer NOS (paternal first cousin)   Cancer Niece 75       gynecologic cancer NOS    Cancer Paternal Great-grandmother        abdominal cancer NOS   Diabetes type II Other    Hypertension Other    Heart attack Other    Alcohol abuse Other    Depression Other    Asthma Other    Migraines Other    Breast cancer Other        paternal great-great aunt (PGF's aunt)   Esophageal cancer Neg Hx    Stomach cancer Neg Hx    Rectal cancer Neg Hx    Colon cancer Neg Hx    Prostate cancer Neg Hx    Pancreatic cancer Neg Hx    Social History   Socioeconomic History   Marital status: Divorced    Spouse name: Not on file   Number of children: 4   Years of education: 12   Highest education level: 12th grade  Occupational History   Occupation: disability  Tobacco Use   Smoking status: Former    Packs/day: 1.50    Years: 44.00    Pack years: 66.00     Types: Cigarettes    Quit date: 04/06/2008    Years since quitting: 13.7    Passive exposure: Past   Smokeless tobacco: Never  Vaping Use   Vaping Use: Never used  Substance and Sexual Activity   Alcohol use: No   Drug use: Yes    Types: Marijuana    Comment: hx of marijuana use at bedtime   Sexual activity: Not Currently  Other Topics Concern   Not on file  Social History Narrative   [facility] POA document appoints Deon Hooks as agent for Ms Shaquoya Cosper. [place] (DOB [birth year])/ T. McDiarmid, MD 04/28/21      Retired [place] [employer] worker - early retirement for mental health reasons.    Has Worked part-time at [employer] as a Scientist, water quality. Patient stopped during Covid.   Patient is divorced.   Quit smoking tobacco June 09 started at age 9. 1 1/2 to 2 ppd .   No etoh    Hx of smokes marijuana - periodically attempts quitting.   Religious - Pentecostal   4 children    Hobbies- word searches and games on her tablet.   No IVDA      Social Information (01/11/2022)    patient lives with daughter in one level apartment ,for past 2 years .    Patient enjoys playing  games on her ipad and watching movies . Primary family support persons is her daughter.    Transportation to appointments provided by self;  No concerns or difficulty affording medication:   Strengths:Financial means, Religious Affiliation, Supportive family/friends   Support System: Family, Church, Spirituality and Able to International aid/development worker Activities of Daily Living    Dressing:Self-care   Eating: Self-care   Ambulation: Partial assistance   Toileting: Self-care   Bathing: Self-care       Instrumental Activities of Daily Living   Shopping: Partial assistance   House/Yard Work: Self-care   Administration of medications: Self-care   Finances: Self-care   Telephone: Self-care   Transportation: Self-care      Social Determinants of Health   Financial Resource Strain: Low Risk    Difficulty of Paying Living Expenses: Not  hard at all  Food Insecurity: No Food Insecurity   Worried About Charity fundraiser in the Last Year: Never true   Arboriculturist in the Last Year: Never true  Transportation Needs: No Transportation Needs   Lack of Transportation (Medical): No   Lack of Transportation (Non-Medical): No  Physical Activity: Inactive   Days of Exercise per Week: 0 days   Minutes of Exercise per Session: 0 min  Stress: Stress Concern Present   Feeling of Stress : To some extent  Social Connections: Socially Isolated   Frequency of Communication with Friends and Family: More than three times a week   Frequency of Social Gatherings with Friends and Family: More than three times a week   Attends Religious Services: Never   Marine scientist or Organizations: No   Attends Archivist Meetings: Never   Marital Status: Divorced   Outpatient Encounter Medications as of 01/11/2022  Medication Sig   atorvastatin (LIPITOR) 20 MG tablet Take 1 tablet (20 mg total) by mouth daily.   buPROPion ER (WELLBUTRIN SR) 100 MG 12 hr tablet Take 100 mg by mouth 2 (two) times daily.   Calcium Carb-Cholecalciferol (CALCIUM-VITAMIN D) 600-400 MG-UNIT TABS Take 1 tablet by mouth daily.   Carboxymethylcellulose Sodium (THERATEARS) 0.25 % SOLN Place 1 drop into both eyes daily as needed (dry eyes).   citalopram (CELEXA) 20 MG tablet Take 20 mg by mouth at bedtime.   Continuous Blood Gluc Sensor (FREESTYLE LIBRE 14 DAY SENSOR) MISC APPLY EVERY 14 DAYS   fexofenadine (ALLEGRA) 180 MG tablet Take 180 mg by mouth daily.   fluticasone (FLONASE) 50 MCG/ACT nasal spray Place 2 sprays into both nostrils daily.   hydrochlorothiazide (HYDRODIURIL) 25 MG tablet TAKE 1 TABLET BY MOUTH EVERY DAY   HYDROcodone-acetaminophen (NORCO) 10-325 MG tablet Take 1 tablet by mouth every 6 (six) hours as needed.   hydrOXYzine (VISTARIL) 50 MG capsule Take 150 mg by mouth at bedtime.    ketoconazole (NIZORAL) 2 % cream APPLY TOPICALLY TO  AFFECTED AREA EVERY DAY (Patient taking differently: Apply 1 application. topically daily as needed for irritation.)   KLOR-CON M10 10 MEQ tablet TAKE 4 TABLETS (40 MEQ TOTAL) BY MOUTH AT BEDTIME.   levothyroxine (SYNTHROID) 125 MCG tablet TAKE 1 TABLET (125 MCG TOTAL) BY MOUTH EVERY MORNING. 30 MINUTES BEFORE FOOD   loperamide (IMODIUM) 2 MG capsule TAKE 1 CAPSULE (2 MG TOTAL) BY MOUTH AS NEEDED FOR DIARRHEA OR LOOSE STOOLS.   LORazepam (ATIVAN) 1 MG tablet Take 0.5 mg by mouth 3 (three) times daily as needed for anxiety.   metoprolol tartrate (LOPRESSOR)  50 MG tablet TAKE 1 TABLET BY MOUTH TWICE A DAY   mirabegron ER (MYRBETRIQ) 50 MG TB24 tablet Take 50 mg by mouth daily.   morphine (MS CONTIN) 15 MG 12 hr tablet Take 1 tablet (15 mg total) by mouth every 12 (twelve) hours.   Multiple Vitamins-Minerals (MULTIVITAMIN WITH MINERALS) tablet Take 1 tablet by mouth daily.   naloxone (NARCAN) nasal spray 4 mg/0.1 mL PLEASE SEE ATTACHED FOR DETAILED DIRECTIONS   omeprazole (PRILOSEC) 40 MG capsule TAKE 1 CAPSULE BY MOUTH EVERY DAY   polyethylene glycol powder (GLYCOLAX/MIRALAX) 17 GM/SCOOP powder Take 17 g by mouth as needed for mild constipation.   prazosin (MINIPRESS) 2 MG capsule Take 2 mg by mouth at bedtime.   Probiotic Product (PROBIOTIC DAILY PO) Take 1 capsule by mouth daily.   prochlorperazine (COMPAZINE) 10 MG tablet TAKE ONE TABLET BY MOUTH TWICE A DAY AS NEEDED FOR NAUSEA OR VOMITING (Patient taking differently: Take 10 mg by mouth 2 (two) times daily as needed for nausea or vomiting. TAKE ONE TABLET BY MOUTH TWICE A DAY AS NEEDED FOR NAUSEA OR VOMITING)   ramipril (ALTACE) 10 MG capsule TAKE 1 CAPSULE BY MOUTH EVERY DAY   simethicone (MYLICON) 80 MG chewable tablet Chew 160 mg by mouth daily as needed (gas).   solifenacin (VESICARE) 5 MG tablet Take 1 tablet (5 mg total) by mouth daily.   tiZANidine (ZANAFLEX) 2 MG tablet TAKE 1 TABLET BY MOUTH EVERYDAY AT BEDTIME   traZODone (DESYREL) 50  MG tablet Take 150 mg by mouth at bedtime.   vitamin B-12 (CYANOCOBALAMIN) 1000 MCG tablet Take 1,000 mcg by mouth daily.   FLUZONE HIGH-DOSE QUADRIVALENT 0.7 ML SUSY  (Patient not taking: Reported on 01/11/2022)   PFIZER COVID-19 VAC BIVALENT injection  (Patient not taking: Reported on 01/11/2022)   senna-docusate (SENOKOT-S) 8.6-50 MG tablet Take 2 tablets by mouth at bedtime. For AFTER surgery, do not take if having diarrhea (Patient not taking: Reported on 01/11/2022)   No facility-administered encounter medications on file as of 01/11/2022.   Activities of Daily Living In your present state of health, do you have any difficulty performing the following activities: 01/11/2022 05/10/2021  Hearing? N N  Vision? N N  Difficulty concentrating or making decisions? Y N  Walking or climbing stairs? Y Y  Comment - SOB and joint pain  Dressing or bathing? Y N  Doing errands, shopping? N N  Preparing Food and eating ? N -  Using the Toilet? N -  In the past six months, have you accidently leaked urine? Y -  Do you have problems with loss of bowel control? N -  Managing your Medications? N -  Managing your Finances? N -  Housekeeping or managing your Housekeeping? Y -  Some recent data might be hidden   Patient Care Team: McDiarmid, Blane Ohara, MD as PCP - General Bjorn Loser, MD as Consulting Physician (Urology) Desma Maxim, MD as Psychiatry     Assessment:   This is a routine wellness examination for [name].  Exercise Activities and Dietary recommendations Current Exercise Habits: The patient does not participate in regular exercise at present, Exercise limited by: psychological condition(s);cardiac condition(s);respiratory conditions(s)   Goals      Begin and Stick with Counseling-Depression     Timeframe:  Long-Range Goal Priority:  High Start Date:04/12/21  Expected End Date:                       Patient Goals/Self-Care Activities: Over the next 90  days - spend time or talk with others at least 2 to 3 times per week Call Dr. Ready office to schedule your counseling appointment  Continue with compliance of taking medication   Why is this important?   Beating depression may take some time.  If you don't feel better right away, don't give up on your treatment plan.      Exercise 3x per week     Walking 10-15 minutes in the AM and 10-15 minutes in PM 3x a week.     Weight (lb) < 226 lb (102.5 kg)     5% weight loss       Fall Risk Fall Risk  01/11/2022 12/01/2021 09/01/2021 04/28/2021 03/03/2021  Falls in the past year? 0 0 0 0 0  Number falls in past yr: 0 0 0 0 0  Injury with Fall? 0 0 0 0 0  Risk Factor Category  - - - - -  Risk for fall due to : Impaired balance/gait - - - -  Follow up Falls prevention discussed - - - -   Patient reports impaired gait and balance. Patient reports using a cane to ambulate when she leaves her home.   Is the patient's home free of loose throw rugs in walkways, pet beds, electrical cords, etc?   yes      Grab bars in the bathroom? yes      Handrails on the stairs?   yes      Adequate lighting?   yes  Patient rating of health (0-10) scale: 5   Depression Screen PHQ 2/9 Scores 01/11/2022 12/01/2021 09/01/2021 06/02/2021  PHQ - 2 Score _0 PHQ- 9 Score _1 Exception Documentation - - - -  Not completed - - - -   Cognitive Function Montreal Cognitive Assessment  04/28/2021  Visuospatial/ Executive (0/5) 5  Naming (0/3) 3  Attention: Read list of digits (0/2) 2  Attention: Read list of letters (0/1) 1  Attention: Serial 7 subtraction starting at 100 (0/3) 3  Language: Repeat phrase (0/2) 1  Language : Fluency (0/1) 1  Abstraction (0/2) 2  Delayed Recall (0/5) 4  Orientation (0/6) 6  Total 28  Adjusted Score (based on education) 29   6CIT Screen 01/11/2022 07/02/2019  What Year? 0 points 0 points  What month? 0 points 0 points  What time? 0 points 0 points  Count back from 20  0 points 0 points  Months in reverse 0 points 0 points  Repeat phrase 0 points 0 points  Total Score 0 0   Immunization History  Administered Date(s) Administered   Influenza Split 07/22/2012   Influenza Whole 08/07/2007, 08/07/2008, 08/10/2009, 07/22/2010   Influenza, High Dose Seasonal PF 06/27/2017, 07/24/2018, 08/26/2020, 06/28/2021   Influenza,inj,Quad PF,6+ Mos 07/05/2017, 07/02/2019   Influenza-Unspecified 07/06/2013, 07/06/2014, 06/09/2015, 07/07/2016, 07/25/2018   PFIZER Comirnaty(Gray Top)Covid-19 Tri-Sucrose Vaccine 02/09/2021, 07/27/2021   PFIZER(Purple Top)SARS-COV-2 Vaccination 12/23/2019, 01/13/2020, 08/26/2020   Pneumococcal Conjugate-13 10/07/2015   Pneumococcal Polysaccharide-23 08/20/2014, 03/22/2018   Td 03/06/2002   Tdap 10/20/2013   Zoster Recombinat (Shingrix) 05/05/2017, 12/07/2017   Screening Tests Health Maintenance  Topic Date Due   FOOT EXAM  09/16/2021   COVID-19 Vaccine (6 - Booster for Pfizer series) 09/21/2021   HEMOGLOBIN A1C  05/31/2022   COLONOSCOPY (Pts 45-50yr Insurance coverage will need to be confirmed)  06/09/2022   OPHTHALMOLOGY EXAM  06/17/2022   MAMMOGRAM  03/23/2023   TETANUS/TDAP  10/21/2023   Pneumonia Vaccine 72 Years old  Completed   INFLUENZA VACCINE  Completed   DEXA SCAN  Completed   Hepatitis C Screening  Completed   Zoster Vaccines- Shingrix  Completed   HPV VACCINES  Aged Out   Cancer Screenings: Lung: Low Dose CT Chest recommended if Age 72-80years, 20 pack-year currently smoking OR have quit w/in 15years. Patient does not qualify. Breast:  Up to date on Mammogram? Yes  03/22/2021 Up to date of Bone Density/Dexa? Yes Colorectal: UTD 06/10/2019  Additional Screenings: Hepatitis C Screening: Completed  HIV Screening: Completed   Plan:  PCP apt scheduled for 4/27. Discuss additional covid booster at apt. Continue to focus on weight loss with support of friends.  I have personally reviewed and noted the following  in the patients chart:   Medical and social history Use of alcohol, tobacco or illicit drugs  Current medications and supplements Functional ability and status Nutritional status Physical activity Advanced directives List of other physicians Hospitalizations, surgeries, and ER visits in previous 12 months Vitals Screenings to include cognitive, depression, and falls Referrals and appointments  In addition, I have reviewed and discussed with patient certain preventive protocols, quality metrics, and best practice recommendations. A written personalized care plan for preventive services as well as general preventive health recommendations were provided to patient.  This visit was conducted virtually in the setting of the [place].    EDorna Bloom [place] 01/13/2022

## 2022-01-13 NOTE — Patient Instructions (Addendum)
Thank you for taking time to come for your Medicare Wellness Visit. I appreciate your ongoing commitment to your health goals. Please review the following plan we discussed and let me know if I can assist you in the future.    These are the goals we discussed:   Goals      Begin and Stick with Counseling-Depression     Timeframe:  Long-Range Goal Priority:  High Start Date:04/12/21                             Expected End Date:                       Patient Goals/Self-Care Activities: Over the next 90 days - spend time or talk with others at least 2 to 3 times per week Call Dr. Georgeanna Lea office to schedule your counseling appointment  Continue with compliance of taking medication   Why is this important?   Beating depression may take some time.  If you don't feel better right away, don't give up on your treatment plan.      Exercise 3x per week     Walking 10-15 minutes in the AM and 10-15 minutes in PM 3x a week.     Weight (lb) < 226 lb (102.5 kg)     5% weight loss       We also discussed recommended health maintenance. As discussed, you are due for: Health Maintenance  Topic Date Due   FOOT EXAM  09/16/2021   COVID-19 Vaccine (6 - Booster for Pfizer series) 09/21/2021   HEMOGLOBIN A1C  05/31/2022   COLONOSCOPY (Pts 45-72yr Insurance coverage will need to be confirmed)  06/09/2022   OPHTHALMOLOGY EXAM  06/17/2022   MAMMOGRAM  03/23/2023   TETANUS/TDAP  10/21/2023   Pneumonia Vaccine 72 Years old  Completed   INFLUENZA VACCINE  Completed   DEXA SCAN  Completed   Hepatitis C Screening  Completed   Zoster Vaccines- Shingrix  Completed   HPV VACCINES  Aged Out   PCP apt scheduled for 4/27. Discuss additional covid booster at apt. Continue to focus on weight loss with support from friends.  Preventive Care 72Years and Older, Female Preventive care refers to lifestyle choices and visits with your health care provider that can promote health and wellness. Preventive care  visits are also called wellness exams. What can I expect for my preventive care visit? Counseling Your health care provider may ask you questions about your: Medical history, including: Past medical problems. Family medical history. Pregnancy and menstrual history. History of falls. Current health, including: Memory and ability to understand (cognition). Emotional well-being. Home life and relationship well-being. Sexual activity and sexual health. Lifestyle, including: Alcohol, nicotine or tobacco, and drug use. Access to firearms. Diet, exercise, and sleep habits. Work and work [employer] Sunscreen use. Safety issues such as seatbelt and bike helmet use. Physical exam Your health care provider will check your: Height and weight. These may be used to calculate your BMI (body mass index). BMI is a measurement that tells if you are at a healthy weight. Waist circumference. This measures the distance around your waistline. This measurement also tells if you are at a healthy weight and may help predict your risk of certain diseases, such as type 2 diabetes and high blood pressure. Heart rate and blood pressure. Body temperature. Skin for abnormal spots. What immunizations do I need? Vaccines are  usually given at various ages, according to a schedule. Your health care provider will recommend vaccines for you based on your age, medical history, and lifestyle or other factors, such as travel or where you work. What tests do I need? Screening Your health care provider may recommend screening tests for certain conditions. This may include: Lipid and cholesterol levels. Hepatitis C test. Hepatitis B test. HIV (human immunodeficiency virus) test. STI (sexually transmitted infection) testing, if you are at risk. Lung cancer screening. Colorectal cancer screening. Diabetes screening. This is done by checking your blood sugar (glucose) after you have not eaten for a while  (fasting). Mammogram. Talk with your health care provider about how often you should have regular mammograms. BRCA-related cancer screening. This may be done if you have a family history of breast, ovarian, tubal, or peritoneal cancers. Bone density scan. This is done to screen for osteoporosis. Talk with your health care provider about your test results, treatment options, and if necessary, the need for more tests. Follow these instructions at home: Eating and drinking  Eat a diet that includes fresh fruits and vegetables, whole grains, lean protein, and low-fat dairy products. Limit your intake of foods with high amounts of sugar, saturated fats, and salt. Take vitamin and mineral supplements as recommended by your health care provider. Do not drink alcohol if your health care provider tells you not to drink. If you drink alcohol: Limit how much you have to 0-1 drink a day. Know how much alcohol is in your drink. In the [place] one drink equals one 12 oz bottle of beer (355 mL), one 5 oz glass of wine (148 mL), or one 1 oz glass of hard liquor (44 mL). Lifestyle Brush your teeth every morning and night with fluoride toothpaste. Floss one time each day. Exercise for at least 30 minutes 5 or more days each week. Do not use any products that contain nicotine or tobacco. These products include cigarettes, chewing tobacco, and vaping devices, such as e-cigarettes. If you need help quitting, ask your health care provider. Do not use drugs. If you are sexually active, practice safe sex. Use a condom or other form of protection in order to prevent STIs. Take aspirin only as told by your health care provider. Make sure that you understand how much to take and what form to take. Work with your health care provider to find out whether it is safe and beneficial for you to take aspirin daily. Ask your health care provider if you need to take a cholesterol-lowering medicine (statin). Find healthy ways to  manage stress, such as: Meditation, yoga, or listening to music. Journaling. Talking to a trusted person. Spending time with friends and family. Minimize exposure to UV radiation to reduce your risk of skin cancer. Safety Always wear your seat belt while driving or riding in a vehicle. Do not drive: If you have been drinking alcohol. Do not ride with someone who has been drinking. When you are tired or distracted. While texting. If you have been using any mind-altering substances or drugs. Wear a helmet and other protective equipment during sports activities. If you have firearms in your house, make sure you follow all gun safety procedures. What's next? Visit your health care provider once a year for an annual wellness visit. Ask your health care provider how often you should have your eyes and teeth checked. Stay up to date on all vaccines. This information is not intended to replace advice given to you by your  health care provider. Make sure you discuss any questions you have with your health care provider. Document Revised: 04/20/2021 Document Reviewed: 04/20/2021 Elsevier Patient Education  [place].   Diet Recommendations for Diabetes   1. Eat at least 3 meals and 1-2 snacks per day. Never go more than 4-5 hours while awake without eating. Eat breakfast within the first hour of getting up.   2. Limit starchy foods to TWO per meal and ONE per snack. ONE portion of a starchy  food is equal to the following:   - ONE slice of bread (or its equivalent, such as half of a hamburger bun).   - 1/2 cup of a "scoopable" starchy food such as potatoes or rice.   - 15 grams of Total Carbohydrate as shown on food label.  3. Include at every meal: a protein food, a carb food, and vegetables and/or fruit.   - Obtain twice the volume of vegetables as protein or carbohydrate foods for both lunch and dinner.   - Fresh or frozen vegetables are best.   - Keep frozen vegetables on hand for  a quick vegetable serving.       Starchy (carb) foods: Bread, rice, pasta, potatoes, corn, cereal, grits, crackers, bagels, muffins, all baked goods.  (Fruits, milk, and yogurt also have carbohydrate, but most of these foods will not spike your blood sugar as most starchy foods will.)  A few fruits do cause high blood sugars; use small portions of bananas (limit to 1/2 at a time), grapes, watermelon, oranges, and most tropical fruits.    Protein foods: Meat, fish, poultry, eggs, dairy foods, and beans such as pinto and kidney beans (beans also provide carbohydrate).        Our clinic's number is [phone]. Please call with questions or concerns about what we discussed today.

## 2022-01-15 DIAGNOSIS — M79641 Pain in right hand: Secondary | ICD-10-CM | POA: Diagnosis not present

## 2022-01-15 DIAGNOSIS — Z79891 Long term (current) use of opiate analgesic: Secondary | ICD-10-CM | POA: Diagnosis not present

## 2022-01-15 DIAGNOSIS — M79644 Pain in right finger(s): Secondary | ICD-10-CM | POA: Diagnosis not present

## 2022-01-17 NOTE — Progress Notes (Signed)
I have reviewed this visit and agree with the documentation.   

## 2022-01-27 DIAGNOSIS — Z8739 Personal history of other diseases of the musculoskeletal system and connective tissue: Secondary | ICD-10-CM | POA: Diagnosis not present

## 2022-01-27 DIAGNOSIS — Z09 Encounter for follow-up examination after completed treatment for conditions other than malignant neoplasm: Secondary | ICD-10-CM | POA: Diagnosis not present

## 2022-02-01 ENCOUNTER — Encounter: Payer: Self-pay | Admitting: Family Medicine

## 2022-02-01 DIAGNOSIS — G894 Chronic pain syndrome: Secondary | ICD-10-CM

## 2022-02-01 DIAGNOSIS — G2581 Restless legs syndrome: Secondary | ICD-10-CM

## 2022-02-01 DIAGNOSIS — M48061 Spinal stenosis, lumbar region without neurogenic claudication: Secondary | ICD-10-CM

## 2022-02-01 DIAGNOSIS — M47896 Other spondylosis, lumbar region: Secondary | ICD-10-CM

## 2022-02-01 DIAGNOSIS — M160 Bilateral primary osteoarthritis of hip: Secondary | ICD-10-CM

## 2022-02-02 MED ORDER — HYDROCODONE-ACETAMINOPHEN 10-325 MG PO TABS: 1.0000 | ORAL_TABLET | Freq: Four times a day (QID) | ORAL | 0 refills | Status: DC | PRN

## 2022-02-04 ENCOUNTER — Telehealth: Payer: Self-pay | Admitting: Family Medicine

## 2022-02-04 ENCOUNTER — Encounter: Payer: PPO | Admitting: Family Medicine

## 2022-02-04 DIAGNOSIS — G894 Chronic pain syndrome: Secondary | ICD-10-CM

## 2022-02-04 DIAGNOSIS — M48061 Spinal stenosis, lumbar region without neurogenic claudication: Secondary | ICD-10-CM

## 2022-02-04 DIAGNOSIS — M160 Bilateral primary osteoarthritis of hip: Secondary | ICD-10-CM

## 2022-02-04 DIAGNOSIS — M47896 Other spondylosis, lumbar region: Secondary | ICD-10-CM

## 2022-02-04 MED ORDER — MORPHINE SULFATE ER 15 MG PO TBCR: 15.0000 mg | EXTENDED_RELEASE_TABLET | Freq: Two times a day (BID) | ORAL | 0 refills | Status: DC

## 2022-02-04 NOTE — Progress Notes (Signed)
Pt requesting refill on MS Contin not sent by her PCP. Instructed her to call provider on call for her pcp. She says he sent her hydrocodone but not MS Contin. I educated her we do not prescribe controlled substances. She verbalized understanding and will call her pcp on call. DWB ?

## 2022-02-04 NOTE — Telephone Encounter (Signed)
**  After Hours/ Emergency Line Call** ? ?Received a call to report that [name] due to needing her chronic pain medication refilled.  Patient had contacted Dr. Wendy Poet on 3/29 to get her chronic pain medications of Norco and MS Contin refilled.  Prescription on chart review only seems to have been sent for the hydrocodone but not the MS Contin.  Patient's last dose is tonight of her last prescription and she needs a refill.  ?Refill provided and forwarded to PCP. ? ?Rise Patience, DO  ?PGY-2, [facility] ?02/04/2022 10:48 AM  ? ?

## 2022-02-07 DIAGNOSIS — R350 Frequency of micturition: Secondary | ICD-10-CM | POA: Diagnosis not present

## 2022-02-07 DIAGNOSIS — N3946 Mixed incontinence: Secondary | ICD-10-CM | POA: Diagnosis not present

## 2022-02-27 DIAGNOSIS — F3181 Bipolar II disorder: Secondary | ICD-10-CM | POA: Diagnosis not present

## 2022-02-27 DIAGNOSIS — Z6841 Body Mass Index (BMI) 40.0 and over, adult: Secondary | ICD-10-CM | POA: Diagnosis not present

## 2022-02-27 DIAGNOSIS — J449 Chronic obstructive pulmonary disease, unspecified: Secondary | ICD-10-CM | POA: Diagnosis not present

## 2022-02-27 DIAGNOSIS — I10 Essential (primary) hypertension: Secondary | ICD-10-CM | POA: Diagnosis not present

## 2022-02-27 DIAGNOSIS — E1169 Type 2 diabetes mellitus with other specified complication: Secondary | ICD-10-CM | POA: Diagnosis not present

## 2022-03-01 ENCOUNTER — Other Ambulatory Visit: Payer: Self-pay | Admitting: Family Medicine

## 2022-03-01 DIAGNOSIS — Z1231 Encounter for screening mammogram for malignant neoplasm of breast: Secondary | ICD-10-CM

## 2022-03-02 ENCOUNTER — Ambulatory Visit (INDEPENDENT_AMBULATORY_CARE_PROVIDER_SITE_OTHER): Payer: PPO

## 2022-03-02 ENCOUNTER — Encounter: Payer: Self-pay | Admitting: Family Medicine

## 2022-03-02 ENCOUNTER — Ambulatory Visit (INDEPENDENT_AMBULATORY_CARE_PROVIDER_SITE_OTHER): Payer: PPO | Admitting: Family Medicine

## 2022-03-02 VITALS — BP 123/72 | HR 64 | Ht 64.0 in | Wt 264.5 lb

## 2022-03-02 DIAGNOSIS — E119 Type 2 diabetes mellitus without complications: Secondary | ICD-10-CM

## 2022-03-02 DIAGNOSIS — M48061 Spinal stenosis, lumbar region without neurogenic claudication: Secondary | ICD-10-CM

## 2022-03-02 DIAGNOSIS — E039 Hypothyroidism, unspecified: Secondary | ICD-10-CM | POA: Diagnosis not present

## 2022-03-02 DIAGNOSIS — Z23 Encounter for immunization: Secondary | ICD-10-CM

## 2022-03-02 DIAGNOSIS — M47896 Other spondylosis, lumbar region: Secondary | ICD-10-CM

## 2022-03-02 DIAGNOSIS — G894 Chronic pain syndrome: Secondary | ICD-10-CM | POA: Diagnosis not present

## 2022-03-02 DIAGNOSIS — M160 Bilateral primary osteoarthritis of hip: Secondary | ICD-10-CM | POA: Diagnosis not present

## 2022-03-02 DIAGNOSIS — E1169 Type 2 diabetes mellitus with other specified complication: Secondary | ICD-10-CM

## 2022-03-02 DIAGNOSIS — E669 Obesity, unspecified: Secondary | ICD-10-CM

## 2022-03-02 DIAGNOSIS — G2581 Restless legs syndrome: Secondary | ICD-10-CM | POA: Diagnosis not present

## 2022-03-02 MED ORDER — HYDROCODONE-ACETAMINOPHEN 10-325 MG PO TABS: 1.0000 | ORAL_TABLET | Freq: Four times a day (QID) | ORAL | 0 refills | Status: DC | PRN

## 2022-03-02 MED ORDER — MORPHINE SULFATE ER 15 MG PO TBCR: 15.0000 mg | EXTENDED_RELEASE_TABLET | Freq: Two times a day (BID) | ORAL | 0 refills | Status: DC

## 2022-03-02 NOTE — Progress Notes (Addendum)
[name] is alone ?Sources of clinical information for visit is/are patient. ?Nursing assessment for this office visit was reviewed with the patient for accuracy and revision.  ? ? ? ?Previous Report(s) Reviewed: none  ? ?  03/02/2022  ? 10:58 AM  ?Depression screen PHQ 2/9  ?Decreased Interest 3  ?Down, Depressed, Hopeless 2  ?PHQ - 2 Score 5  ?Altered sleeping 2  ?Tired, decreased energy 3  ?Change in appetite 2  ?Feeling bad or failure about yourself  2  ?Trouble concentrating 3  ?Moving slowly or fidgety/restless 0  ?Suicidal thoughts 0  ?PHQ-9 Score 17  ?Difficult doing work/chores Very difficult  ? ?[place] Office Visit from 03/02/2022 in [place] from 01/11/2022 in [place] Office Visit from 12/01/2021 in [place]  ?Thoughts that you would be better off dead, or of hurting yourself in some way Not at all Not at all Not at all  ?PHQ-9 Total Score '17 12 12  '$ ? ?  ?  ? ?  03/02/2022  ? 11:01 AM 01/11/2022  ?  4:17 PM 12/01/2021  ? 10:13 AM 09/01/2021  ? 11:14 AM 04/28/2021  ?  2:22 PM  ?Fall Risk   ?Falls in the past year? 0 0 0 0 0  ?Number falls in past yr: 0 0 0 0 0  ?Injury with Fall? 0 0 0 0 0  ?Risk for fall due to :  Impaired balance/gait     ?Follow up  Falls prevention discussed     ? ? ? ?  03/02/2022  ? 10:58 AM 01/11/2022  ?  4:14 PM 12/01/2021  ? 10:12 AM  ?PHQ9 SCORE ONLY  ?PHQ-9 Total Score '17 12 12  '$ ? ? ?Adult vaccines due  ?Topic Date Due  ? TETANUS/TDAP  10/21/2023  ? ? ?Health Maintenance Due  ?Topic Date Due  ? FOOT EXAM  09/16/2021  ?  ? ? ?History/P.E. limitations: none ? ?Adult vaccines due  ?Topic Date Due  ? TETANUS/TDAP  10/21/2023  ?  ?Diabetes Health Maintenance Due  ?Topic Date Due  ? FOOT EXAM  09/16/2021  ? HEMOGLOBIN A1C  05/31/2022  ? OPHTHALMOLOGY EXAM  06/17/2022  ?  ?Health Maintenance Due  ?Topic Date Due  ? FOOT EXAM  09/16/2021  ?  ? ?Chief Complaint  ?Patient presents with  ?  Follow-up  ?  29mo ?  ? ?CPT E&M Office Visit Time ?Before Visit; reviewing medical records (e.g. recent visits, labs, studies): 5 minutes ?During Visit (F2F time): 20 minutes ?After Visit (discussion with family or HCP, prescribing, ordering, referring, calling result/recommendations or documenting on same day): 5 minutes ?Total Visit Time: 30 minutes ?

## 2022-03-02 NOTE — Patient Instructions (Addendum)
?  We will discuss decreasing your MS Contin over time at your next office visit in 3 months ? ?We will see what  GLP-1 injection for your diabetes your insurance will best cover.   ? ?Refill of your MS Contin and Hydrocodone-acetaminophen was sent to your pharmacy.  ?

## 2022-03-03 ENCOUNTER — Other Ambulatory Visit: Payer: Self-pay | Admitting: Family Medicine

## 2022-03-03 DIAGNOSIS — E119 Type 2 diabetes mellitus without complications: Secondary | ICD-10-CM

## 2022-03-03 DIAGNOSIS — E1169 Type 2 diabetes mellitus with other specified complication: Secondary | ICD-10-CM

## 2022-03-03 LAB — MICROALBUMIN / CREATININE URINE RATIO
Creatinine, Urine: 60.3 mg/dL
Microalb/Creat Ratio: <5 mg/g creat (ref 0–29)
Microalbumin, Urine: <3 ug/mL

## 2022-03-03 LAB — TSH: TSH: 1.04 u[IU]/mL (ref 0.450–4.500)

## 2022-03-03 MED ORDER — OZEMPIC (0.25 OR 0.5 MG/DOSE) 2 MG/1.5ML ~~LOC~~ SOPN: 0.2500 mg | PEN_INJECTOR | SUBCUTANEOUS | 0 refills | Status: DC

## 2022-03-03 NOTE — Assessment & Plan Note (Signed)
Established problem ?Wt Readings from Last 3 Encounters:  ?03/02/22 264 lb 8 oz (120 kg)  ?01/11/22 262 lb (118.8 kg)  ?12/01/21 256 lb 6.4 oz (116.3 kg)  ? ?Uncontrolled.  Patient is not at goal of weight stabilization or reduction. ?Start: Ozempic 0.25 mg [place] q wk x 4 weeks, titration over four diffent doses over 4 weeks each ? ? ? ?

## 2022-03-03 NOTE — Assessment & Plan Note (Addendum)
Established problem ?Ozempic 0.25 mg [place] q wk x 4 weeks, titration over four diffent doses over 4 weeks each ? ?

## 2022-03-03 NOTE — Assessment & Plan Note (Signed)
Lab Results  ?Component Value Date  ? TSH 1.040 03/02/2022  ?Established problem ?TSH in desired range ?No signs of complications, medication side effects, or red flags. ?Continue levothyroxine 125 microgram daily ? ? ?

## 2022-03-03 NOTE — Assessment & Plan Note (Addendum)
Established problem ?Well Controlled and is at goal of analgesia adequate to allow completion of ADLs and iADLs. ?No signs of complications, medication side effects, or red flags. PDMP reviewed.  No evicence of doctor shopping.  ?Continue MS Contin 15 mg twice a day and hydrocodone-apap 10/325 prn breakthru pain and intolerable RLS symptoms.  ? ?Preliminary deprescribing plan: Change to [place] (MS ER one or twice daily) 20 mg daily (not on patient's formulary), or [place]an 10 mg or 5 mg tabs every 12 hrs (again, not on patient's formulary), or just MS Contin 15 mg at night.  ? ? ? ?

## 2022-03-03 NOTE — Addendum Note (Signed)
Addended byWendy Poet, Lacrisha Bielicki D on: 03/03/2022 10:32 AM ? ? Modules accepted: Orders ? ?

## 2022-03-03 NOTE — Assessment & Plan Note (Signed)
Established problem worsened.  ?Patient is not at goal of consistent nightly control of symptoms ?With proposed reduction in patient's long-acting Morphine at next office visit, it may be necessary to introduce a dopamine agonist to control symptoms.  ? ?

## 2022-03-07 ENCOUNTER — Ambulatory Visit
Admission: RE | Admit: 2022-03-07 | Discharge: 2022-03-07 | Disposition: A | Discharge status: Home / Self Care | Payer: PPO | Source: Ambulatory Visit | Attending: Family Medicine | Admitting: Family Medicine

## 2022-03-07 DIAGNOSIS — Z1231 Encounter for screening mammogram for malignant neoplasm of breast: Secondary | ICD-10-CM | POA: Diagnosis not present

## 2022-03-29 ENCOUNTER — Encounter: Payer: Self-pay | Admitting: Family Medicine

## 2022-03-29 DIAGNOSIS — G2581 Restless legs syndrome: Secondary | ICD-10-CM

## 2022-03-29 DIAGNOSIS — M160 Bilateral primary osteoarthritis of hip: Secondary | ICD-10-CM

## 2022-03-29 DIAGNOSIS — E1169 Type 2 diabetes mellitus with other specified complication: Secondary | ICD-10-CM

## 2022-03-29 DIAGNOSIS — M48061 Spinal stenosis, lumbar region without neurogenic claudication: Secondary | ICD-10-CM

## 2022-03-29 DIAGNOSIS — G894 Chronic pain syndrome: Secondary | ICD-10-CM

## 2022-03-29 DIAGNOSIS — M47896 Other spondylosis, lumbar region: Secondary | ICD-10-CM

## 2022-03-30 MED ORDER — MORPHINE SULFATE ER 15 MG PO TBCR: 15.0000 mg | EXTENDED_RELEASE_TABLET | Freq: Two times a day (BID) | ORAL | 0 refills | Status: DC

## 2022-03-30 MED ORDER — HYDROCODONE-ACETAMINOPHEN 10-325 MG PO TABS: 1.0000 | ORAL_TABLET | Freq: Four times a day (QID) | ORAL | 0 refills | Status: DC | PRN

## 2022-04-06 ENCOUNTER — Encounter (HOSPITAL_BASED_OUTPATIENT_CLINIC_OR_DEPARTMENT_OTHER): Payer: Self-pay | Admitting: *Deleted

## 2022-04-06 ENCOUNTER — Ambulatory Visit
Admission: EM | Admit: 2022-04-06 | Discharge: 2022-04-06 | Disposition: A | Discharge status: Home / Self Care | Payer: PPO

## 2022-04-06 ENCOUNTER — Encounter: Payer: Self-pay | Admitting: Physician Assistant

## 2022-04-06 ENCOUNTER — Emergency Department (HOSPITAL_BASED_OUTPATIENT_CLINIC_OR_DEPARTMENT_OTHER): Payer: PPO

## 2022-04-06 ENCOUNTER — Telehealth: Payer: Self-pay

## 2022-04-06 ENCOUNTER — Telehealth: Payer: PPO | Admitting: Physician Assistant

## 2022-04-06 ENCOUNTER — Emergency Department (HOSPITAL_BASED_OUTPATIENT_CLINIC_OR_DEPARTMENT_OTHER)
Admission: EM | Admit: 2022-04-06 | Discharge: 2022-04-06 | Disposition: A | Discharge status: Home / Self Care | Payer: PPO | Attending: Emergency Medicine | Admitting: Emergency Medicine

## 2022-04-06 ENCOUNTER — Other Ambulatory Visit: Payer: Self-pay

## 2022-04-06 DIAGNOSIS — R1084 Generalized abdominal pain: Secondary | ICD-10-CM

## 2022-04-06 DIAGNOSIS — I10 Essential (primary) hypertension: Secondary | ICD-10-CM | POA: Diagnosis not present

## 2022-04-06 DIAGNOSIS — J449 Chronic obstructive pulmonary disease, unspecified: Secondary | ICD-10-CM | POA: Insufficient documentation

## 2022-04-06 DIAGNOSIS — A09 Infectious gastroenteritis and colitis, unspecified: Secondary | ICD-10-CM | POA: Diagnosis not present

## 2022-04-06 DIAGNOSIS — E119 Type 2 diabetes mellitus without complications: Secondary | ICD-10-CM | POA: Insufficient documentation

## 2022-04-06 DIAGNOSIS — R1032 Left lower quadrant pain: Secondary | ICD-10-CM | POA: Diagnosis not present

## 2022-04-06 DIAGNOSIS — E039 Hypothyroidism, unspecified: Secondary | ICD-10-CM | POA: Diagnosis not present

## 2022-04-06 DIAGNOSIS — K429 Umbilical hernia without obstruction or gangrene: Secondary | ICD-10-CM | POA: Diagnosis not present

## 2022-04-06 DIAGNOSIS — R197 Diarrhea, unspecified: Secondary | ICD-10-CM | POA: Insufficient documentation

## 2022-04-06 DIAGNOSIS — Z79899 Other long term (current) drug therapy: Secondary | ICD-10-CM | POA: Diagnosis not present

## 2022-04-06 DIAGNOSIS — I700 Atherosclerosis of aorta: Secondary | ICD-10-CM | POA: Diagnosis not present

## 2022-04-06 DIAGNOSIS — K760 Fatty (change of) liver, not elsewhere classified: Secondary | ICD-10-CM | POA: Diagnosis not present

## 2022-04-06 DIAGNOSIS — R109 Unspecified abdominal pain: Secondary | ICD-10-CM | POA: Diagnosis present

## 2022-04-06 LAB — URINALYSIS, ROUTINE W REFLEX MICROSCOPIC
Bilirubin Urine: NEGATIVE
Glucose, UA: NEGATIVE mg/dL
Hgb urine dipstick: NEGATIVE
Ketones, ur: NEGATIVE mg/dL
Nitrite: NEGATIVE
Specific Gravity, Urine: 1.015 (ref 1.005–1.030)
pH: 6 (ref 5.0–8.0)

## 2022-04-06 LAB — COMPREHENSIVE METABOLIC PANEL
ALT: 30 U/L (ref 0–44)
AST: 41 U/L (ref 15–41)
Albumin: 4.8 g/dL (ref 3.5–5.0)
Alkaline Phosphatase: 51 U/L (ref 38–126)
Anion gap: 12 (ref 5–15)
BUN: 14 mg/dL (ref 8–23)
CO2: 25 mmol/L (ref 22–32)
Calcium: 9.7 mg/dL (ref 8.9–10.3)
Chloride: 94 mmol/L — ABNORMAL LOW (ref 98–111)
Creatinine, Ser: 0.9 mg/dL (ref 0.44–1.00)
GFR, Estimated: >60 mL/min (ref >60)
Glucose, Bld: 130 mg/dL — ABNORMAL HIGH (ref 70–99)
Potassium: 4 mmol/L (ref 3.5–5.1)
Sodium: 131 mmol/L — ABNORMAL LOW (ref 135–145)
Total Bilirubin: 0.8 mg/dL (ref 0.3–1.2)
Total Protein: 8 g/dL (ref 6.5–8.1)

## 2022-04-06 LAB — LIPASE, BLOOD: Lipase: 42 U/L (ref 11–51)

## 2022-04-06 LAB — CBC
HCT: 43.1 % (ref 36.0–46.0)
Hemoglobin: 14.6 g/dL (ref 12.0–15.0)
MCH: 29.9 pg (ref 26.0–34.0)
MCHC: 33.9 g/dL (ref 30.0–36.0)
MCV: 88.3 fL (ref 80.0–100.0)
Platelets: 329 10*3/uL (ref 150–400)
RBC: 4.88 MIL/uL (ref 3.87–5.11)
RDW: 12.4 % (ref 11.5–15.5)
WBC: 15.1 10*3/uL — ABNORMAL HIGH (ref 4.0–10.5)
nRBC: 0 % (ref 0.0–0.2)

## 2022-04-06 MED ORDER — AZITHROMYCIN 250 MG PO TABS
500.0000 mg | ORAL_TABLET | Freq: Once | ORAL | Status: AC
  Administered 2022-04-06: 500 mg via ORAL
  Filled 2022-04-06: qty 2

## 2022-04-06 MED ORDER — AZITHROMYCIN 250 MG PO TABS: 250.0000 mg | ORAL_TABLET | Freq: Every day | ORAL | 0 refills | Status: DC

## 2022-04-06 MED ORDER — FENTANYL CITRATE PF 50 MCG/ML IJ SOSY
50.0000 ug | PREFILLED_SYRINGE | Freq: Once | INTRAMUSCULAR | Status: AC
  Administered 2022-04-06: 50 ug via INTRAVENOUS
  Filled 2022-04-06: qty 1

## 2022-04-06 MED ORDER — ONDANSETRON HCL 4 MG/2ML IJ SOLN
4.0000 mg | Freq: Once | INTRAMUSCULAR | Status: AC
Start: 2022-04-06 — End: 2022-04-06
  Administered 2022-04-06: 4 mg via INTRAVENOUS
  Filled 2022-04-06: qty 2

## 2022-04-06 MED ORDER — LACTATED RINGERS IV BOLUS
500.0000 mL | Freq: Once | INTRAVENOUS | Status: AC
  Administered 2022-04-06: 500 mL via INTRAVENOUS

## 2022-04-06 MED ORDER — IOHEXOL 300 MG/ML  SOLN
100.0000 mL | Freq: Once | INTRAMUSCULAR | Status: AC | PRN
  Administered 2022-04-06: 100 mL via INTRAVENOUS

## 2022-04-06 MED ORDER — ONDANSETRON 4 MG PO TBDP: 4.0000 mg | ORAL_TABLET | Freq: Three times a day (TID) | ORAL | 0 refills | Status: DC | PRN

## 2022-04-06 MED ORDER — DICYCLOMINE HCL 20 MG PO TABS: 20.0000 mg | ORAL_TABLET | Freq: Two times a day (BID) | ORAL | 0 refills | Status: DC | PRN

## 2022-04-06 NOTE — ED Provider Notes (Signed)
[facility] EMERGENCY DEPT Provider Note   CSN: [id number] Arrival date & time: 04/06/22  1904     History  Chief Complaint  Patient presents with   Abdominal Pain    [name] is a 71 y.o. female.  HPI     71 year old female with a history of COPD, bipolar disorder, adrenal nodule, chronic pain syndrome, bipolar disorder, diabetes, restless leg syndrome, history of prior episode of colitis, and also noted a history of seeing Dr. Loletha Carrow of gastroenterology for abdominal pain and diarrhea, who presents with concern for abdominal pain and diarrhea.  Reports symptoms began yesterday with diffuse abdominal pain and cramping, frequent belching, nausea, and diarrhea.  Reports she had 7 episodes of diarrhea yesterday, and today took Imodium and continued to have 6-7 episodes of diarrhea.  She has not been on any recent antibiotics, no known sick contacts or suspicious foods.  Denies fevers, dysuria, vaginal bleeding or discharge, chest pain or shortness of breath.  She was seen in urgent care and recommended to come to the emergency department for CT scan.  Reports she had a previous CT scan that showed colitis, which improved after she was treated with ciprofloxacin and Flagyl.  Past Medical History:  Diagnosis Date   Adrenal adenoma, left 12/02/2020   CT AP 11/16/20:  left adrenal mass measuring approximately 2.7 cm. This is minimally increased in size from prior study and is consistent with a benign adrenal adenoma.    Adrenal nodule ([place]) 04/08/2011   04/08/11 Abdominal CT showed Left adrenal nodule which is tachnically indeterminate but most likely an adenoma.  It is 1.7 cm and 42 HU on portal venous phase image. 31 HU on delayed images.   01/09/2012 Abdominal CT w/ & w/o CM: Stable benign left adrenal adenoma. No further specific follow-up is needed for this finding.     Allergic rhinitis 01/03/2007   Qualifier: Diagnosis of  By: Drucie Ip     Allergy    Anemia     hx of  in 20's   Anxiety    Bipolar disorder ([place])    Carpal tunnel syndrome 02/14/2007   S/P carpel tunnel release bilaterally.     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 03/28/2018 03/28/2018   COPD WITHOUT EXACERBATION 01/03/2007   Qualifier: Diagnosis of  By: Drucie Ip  emphysema   Diabetes mellitus without complication ([place])    I4P 6.5 as of 05-2019- diet changes, no meds currently    DISC WITH RADICULOPATHY 01/03/2007   Qualifier: Diagnosis of  By: Drucie Ip     Emphysema of lung ([place])    Esophageal reflux 01/03/2007   Centricity Description: GASTROESOPHAGEAL REFLUX, NO ESOPHAGITIS Qualifier: Diagnosis of  By: Drucie Ip   Centricity Description: GERD Qualifier: Diagnosis of  By: Tye Savoy MD, [place]     External hemorrhoids s/p hemorrhoidectomy 03/28/2018 03/28/2018   Family history of breast cancer    Family history of cancer of female genital organ    Family history of colon cancer    Family history of gene mutation    BRIP1 gene mutation (c.2010dup) in daughter   Family history of lung cancer    Fibromyalgia    Fracture of bone spur of inferior portion of calcaneus 10/06/2018   Functional gastrointestinal disorder 06/18/2008   Functional Gastrointestinal Disorder with frequent eructation and bloating sensation with acute flares.  Responsive to  Compazine and Bentyl.  Takes a few days to calm down.     GERD without esophagitis 01/03/2007   Centricity Description: GASTROESOPHAGEAL REFLUX, NO ESOPHAGITIS Qualifier: Diagnosis of  By: Drucie Ip   Centricity Description: GERD Qualifier: Diagnosis of  By: Tye Savoy MD, [place]     Hearing impairment 04/28/2021   Heel spur, fracture, left 04/17/2018   HEMORRHOIDS, NOS 01/03/2007   Qualifier: Diagnosis of  By: Drucie Ip     Hepatic steatosis 04/12/2011   Herpes simplex labialis 05/11/2011    Hip osteoarthritis 05/23/2007   MRI Pelvis (03/13/07) Bilateral osteoarthritis of the hips, left greater than right.  The  patient has a slightly more prominent hip effusion on the left than  the right.  No other significant abnormality.     History of MRSA infection 04/28/2011   HYPERCHOLESTEROLEMIA 12/24/2008   Hypertension    HYPERTENSION, BENIGN SYSTEMIC 01/03/2007   Qualifier: Diagnosis of  By: Drucie Ip     HYPERTRIGLYCERIDEMIA 11/11/2007   Qualifier: Diagnosis of  By: McDiarmid MD, Todd     Hypokalemia 12/12/2017   Hypothyroidism 02/15/2007   Qualifier: Diagnosis of  By: McDiarmid MD, Jannifer Franklin, BORDERLINE 02/15/2007   Insomnia 03/03/2021   Intra-abdominal adhesions    Irritable bowel syndrome 01/03/2007   Qualifier: Diagnosis of  By: Drucie Ip     LACTOSE INTOLERANCE 03/10/2008   Major depressive disorder, recurrent episode ([place]) 01/03/2007   Qualifier: Diagnosis of  By: Drucie Ip     Medication management 12/14/2016   Managing topiramate for weight loss starting 12/14/16   Memory difficulties 09/81/1914   Monoallelic mutation of BRIP1 gene 01/17/2021   c.2010dup (p.[id number]*)   No impairment of memory 04/29/2021   Obesity (BMI 30.0-34.9) 09/30/2007   Has lost 55 lbs since easter. Goal of <190 by 08/05/12.      Obesity, Class III, BMI 40-49.9 (morbid obesity) ([place]) 09/30/2007        Osteoarthritis of spine 01/03/2007   MR I Lumbar (08/03): GeneralDDD; L2-3 Large  HNP.  Mod pos  hnp - 12/14/2005, smhnpw/irritL4root - 12/14/2005     OSTEOARTHRITIS, HANDS, BILATERAL 06/18/2008   Qualifier: Diagnosis of  By: McDiarmid MD, Kristeen Miss FEMORAL STRESS SYNDROME 01/03/2007   Qualifier: Diagnosis of  By: Drucie Ip     Periodic limb movements of sleep 12/05/2010   Diagnosed on Polysomnography testing Fall 2011.      Pneumonia    walking pneumonia in the 90's   Pre-diabetes 12/25/2008   Qualifier: Diagnosis of  By: McDiarmid MD, Todd     Prolapsed  internal hemorrhoids, grade 3, s/p ligation/pexy/hemorrhoidectomy 03/28/2018 03/28/2018   RESTLESS LEGS SYNDROME 09/11/2007   Polysomnography (10/2010, Dr Keturah Barre, interpreter. ) showed periodic limb movements that frequently awoke patient from sleep with arousal index of 4 per hour. Dr Danton Sewer (Sleep Specialist) thought her RLS was the major contributor to patient poor sleep condition, more than any sleep apnea.  He recommended considering opamine agonist (either requip or mirapex) to see if this will help.      Rosacea, acne 10/24/2011   SIGMOID POLYP 01/23/2008   Solitary lung nodule 04/08/2011   Stage 1 mild COPD by GOLD classification ([place]) 01/03/2007   11/28/2017 Chest CT: Mild centrilobular and paraseptal emphysema with mild diffuse bronchial wall thickening     Stress incontinence    Synovial cyst of lumbar facet joint 02/17/2016   Trigger thumb of left hand 08/16/2018   Urticaria, chronic  05/22/2011   VITAMIN D DEFICIENCY 03/03/2010   Work-related condition 05/17/2018     Home Medications Prior to Admission medications   Medication Sig Start Date End Date Taking? Authorizing Provider  azithromycin (ZITHROMAX) 250 MG tablet Take 1 tablet (250 mg total) by mouth daily. Take first 2 tablets together, then 1 every day until finished. 04/06/22  Yes Gareth Morgan, MD  dicyclomine (BENTYL) 20 MG tablet Take 1 tablet (20 mg total) by mouth 2 (two) times daily as needed for spasms (abdominal pain). 04/06/22  Yes Gareth Morgan, MD  ondansetron (ZOFRAN-ODT) 4 MG disintegrating tablet Take 1 tablet (4 mg total) by mouth every 8 (eight) hours as needed for nausea or vomiting. 04/06/22  Yes Gareth Morgan, MD  atorvastatin (LIPITOR) 20 MG tablet Take 1 tablet (20 mg total) by mouth daily. 11/02/21   McDiarmid, Blane Ohara, MD  buPROPion ER [facility] SR) 100 MG 12 hr tablet Take 100 mg by mouth 2 (two) times daily. 05/18/21   [provider]  Calcium Carb-Cholecalciferol  (CALCIUM-VITAMIN D) 600-400 MG-UNIT TABS Take 1 tablet by mouth daily.    [provider]  Carboxymethylcellulose Sodium (THERATEARS) 0.25 % SOLN Place 1 drop into both eyes daily as needed (dry eyes).    [provider]  citalopram (CELEXA) 20 MG tablet Take 20 mg by mouth at bedtime. 10/06/21   [provider]  Continuous Blood Gluc Sensor (FREESTYLE LIBRE 14 DAY SENSOR) MISC APPLY EVERY 14 DAYS 04/05/21   McDiarmid, Blane Ohara, MD  fexofenadine (ALLEGRA) 180 MG tablet Take 180 mg by mouth daily.    [provider]  fluticasone (FLONASE) 50 MCG/ACT nasal spray Place 2 sprays into both nostrils daily.    [provider]  FLUZONE HIGH-DOSE QUADRIVALENT 0.7 ML SUSY  06/28/21   [provider]  hydrochlorothiazide (HYDRODIURIL) 25 MG tablet TAKE 1 TABLET BY MOUTH EVERY DAY 12/27/21   McDiarmid, Blane Ohara, MD  HYDROcodone-acetaminophen (NORCO) 10-325 MG tablet Take 1 tablet by mouth every 6 (six) hours as needed for moderate pain. 04/01/22 05/01/22  McDiarmid, Blane Ohara, MD  hydrOXYzine (VISTARIL) 50 MG capsule Take 150 mg by mouth at bedtime.  09/02/15   [provider]  ketoconazole (NIZORAL) 2 % cream APPLY TOPICALLY TO AFFECTED AREA EVERY DAY Patient taking differently: Apply 1 application. topically daily as needed for irritation. 10/14/20   McDiarmid, Blane Ohara, MD  KLOR-CON M10 10 MEQ tablet TAKE 4 TABLETS (40 MEQ TOTAL) BY MOUTH AT BEDTIME. 12/27/21   McDiarmid, Blane Ohara, MD  levothyroxine (SYNTHROID) 125 MCG tablet TAKE 1 TABLET (125 MCG TOTAL) BY MOUTH EVERY MORNING. [place] 02/07/21   McDiarmid, Blane Ohara, MD  loperamide (IMODIUM) 2 MG capsule TAKE 1 CAPSULE (2 MG TOTAL) BY MOUTH AS NEEDED FOR DIARRHEA OR LOOSE STOOLS. 08/12/21   McDiarmid, Blane Ohara, MD  LORazepam (ATIVAN) 0.5 MG tablet Take 0.5 mg by mouth 3 (three) times daily as needed. 01/22/22   [provider]  metoprolol tartrate (LOPRESSOR) 50 MG tablet TAKE 1 TABLET BY MOUTH  TWICE A DAY 10/03/21   McDiarmid, Blane Ohara, MD  morphine (MS CONTIN) 15 MG 12 hr tablet Take 1 tablet (15 mg total) by mouth every 12 (twelve) hours. 03/30/22 04/29/22  McDiarmid, Blane Ohara, MD  Multiple Vitamins-Minerals (MULTIVITAMIN WITH MINERALS) tablet Take 1 tablet by mouth daily.    [provider]  naloxone [facility]) nasal spray 4 mg/0.1 mL PLEASE SEE ATTACHED FOR DETAILED DIRECTIONS 07/07/21   [provider]  omeprazole (PRILOSEC) 40 MG capsule TAKE 1 CAPSULE BY MOUTH EVERY DAY 12/27/21   McDiarmid, Blane Ohara, MD  OZEMPIC, 0.25 OR 0.5 MG/DOSE, 2 MG/3ML SOPN INJECT 0.25 MG INTO THE SKIN ONCE A WEEK FOR 4 DOSES 03/03/22   McDiarmid, Blane Ohara, MD  PFIZER COVID-19 VAC BIVALENT injection  07/27/21   [provider]  polyethylene glycol powder (GLYCOLAX/MIRALAX) 17 GM/SCOOP powder Take 17 g by mouth as needed for mild constipation.    [provider]  prazosin (MINIPRESS) 2 MG capsule Take 2 mg by mouth at bedtime. 02/18/21   [provider]  Probiotic Product (PROBIOTIC DAILY PO) Take 1 capsule by mouth daily.    [provider]  prochlorperazine (COMPAZINE) 10 MG tablet TAKE ONE TABLET BY MOUTH TWICE A DAY AS NEEDED FOR NAUSEA OR VOMITING Patient taking differently: Take 10 mg by mouth 2 (two) times daily as needed for nausea or vomiting. TAKE ONE TABLET BY MOUTH TWICE A DAY AS NEEDED FOR NAUSEA OR VOMITING 07/14/20   McDiarmid, Blane Ohara, MD  ramipril (ALTACE) 10 MG capsule TAKE 1 CAPSULE BY MOUTH EVERY DAY 12/27/21   McDiarmid, Blane Ohara, MD  senna-docusate (SENOKOT-S) 8.6-50 MG tablet Take 2 tablets by mouth at bedtime. For AFTER surgery, do not take if having diarrhea 05/10/21   [name] D, NP  simethicone (MYLICON) 80 MG chewable tablet Chew 160 mg by mouth daily as needed (gas).    [provider]  solifenacin (VESICARE) 5 MG tablet Take 1 tablet (5 mg total) by mouth daily. 08/25/21 08/20/22  McDiarmid, Blane Ohara, MD  tiZANidine (ZANAFLEX) 2 MG tablet  TAKE 1 TABLET BY MOUTH EVERYDAY AT BEDTIME 01/03/22   McDiarmid, Blane Ohara, MD  traZODone (DESYREL) 50 MG tablet Take 150 mg by mouth at bedtime.    [provider]  Vibegron 75 MG TABS Take 1 tablet by mouth.    [provider]  vitamin B-12 (CYANOCOBALAMIN) 1000 MCG tablet Take 1,000 mcg by mouth daily.    [provider]      Allergies    Diclofenac-misoprostol, Aripiprazole, Emsam [selegiline], Estradiol, Ibuprofen, Naproxen, Neurontin [gabapentin], Paroxetine, Prednisone, and Tape    Review of Systems   Review of Systems  Physical Exam Updated Vital Signs BP (!) 143/73   Pulse (!) 59   Temp 98.3 F (36.8 C)   Resp 18   SpO2 93%  Physical Exam Vitals and nursing note reviewed.  Constitutional:      General: She is not in acute distress.    Appearance: She is well-developed. She is not diaphoretic.  HENT:     Head: Normocephalic and atraumatic.  Eyes:     Conjunctiva/sclera: Conjunctivae normal.  Cardiovascular:     Rate and Rhythm: Normal rate and regular rhythm.     Heart sounds: Normal heart sounds. No murmur heard.   No friction rub. No gallop.  Pulmonary:     Effort: Pulmonary effort is normal. No respiratory distress.     Breath sounds: Normal breath sounds. No wheezing or rales.  Abdominal:     General: There is no distension.     Palpations: Abdomen is soft.     Tenderness: There is abdominal tenderness in the suprapubic area and left lower quadrant. There is no guarding.  Musculoskeletal:        General: No tenderness.     Cervical back: Normal range of motion.  Skin:    General: Skin is warm and dry.  Findings: No erythema or rash.  Neurological:     Mental Status: She is alert and oriented to person, place, and time.    ED Results / Procedures / Treatments   Labs (all labs ordered are listed, but only abnormal results are displayed) Labs Reviewed  COMPREHENSIVE METABOLIC PANEL - Abnormal; Notable for the following  components:      Result Value   Sodium 131 (*)    Chloride 94 (*)    Glucose, Bld 130 (*)    All other components within normal limits  CBC - Abnormal; Notable for the following components:   WBC 15.1 (*)    All other components within normal limits  URINALYSIS, ROUTINE W REFLEX MICROSCOPIC - Abnormal; Notable for the following components:   Protein, ur TRACE (*)    Leukocytes,Ua MODERATE (*)    Bacteria, UA RARE (*)    All other components within normal limits  GASTROINTESTINAL PANEL BY PCR, STOOL (REPLACES STOOL CULTURE)  C DIFFICILE QUICK SCREEN W PCR REFLEX    LIPASE, BLOOD    EKG None  Radiology CT ABDOMEN PELVIS W CONTRAST  Result Date: 04/06/2022 CLINICAL DATA:  Left lower quadrant abdominal pain. EXAM: CT ABDOMEN AND PELVIS WITH CONTRAST TECHNIQUE: Multidetector CT imaging of the abdomen and pelvis was performed using the standard protocol following bolus administration of intravenous contrast. RADIATION DOSE REDUCTION: This exam was performed according to the departmental dose-optimization program which includes automated exposure control, adjustment of the mA and/or kV according to patient size and/or use of iterative reconstruction technique. CONTRAST:  142m OMNIPAQUE IOHEXOL 300 MG/ML  SOLN COMPARISON:  CT abdomen and pelvis 11/16/2020. CT abdomen and pelvis 01/25/2012. FINDINGS: Lower chest: There are trace bilateral pleural effusions. Hepatobiliary: There is diffuse fatty infiltration of the liver. No focal liver lesions are seen. Gallbladder and bile ducts are within normal limits. Pancreas: Unremarkable. No pancreatic ductal dilatation or surrounding inflammatory changes. Spleen: Normal in size without focal abnormality. Adrenals/Urinary Tract: 2.5 cm left adrenal nodule is unchanged from the prior study. This was previously characterized as adenoma. Right adrenal gland, kidneys, and bladder are within normal limits. Stomach/Bowel: Stomach is within normal limits. Appendix is  not seen. No evidence of bowel wall thickening, distention, or inflammatory changes. Vascular/Lymphatic: Aortic atherosclerosis. No enlarged abdominal or pelvic lymph nodes. Reproductive: Status post hysterectomy. No adnexal masses. Other: Small fat containing umbilical hernia.  No free fluid. Musculoskeletal: Multilevel degenerative changes affect the spine. There is stable mild chronic compression deformity of L1. IMPRESSION: 1. No acute localizing process in the abdomen or pelvis. 2. Fatty infiltration of the liver. 3. Trace bilateral pleural effusions. 4. Aortic Atherosclerosis (ICD10-I70.0). Electronically Signed   By: ARonney AstersM.D.   On: 04/06/2022 21:56    Procedures Procedures    Medications Ordered in ED Medications  ondansetron (ZOFRAN) injection 4 mg (4 mg Intravenous Given 04/06/22 2118)  fentaNYL (SUBLIMAZE) injection 50 mcg (50 mcg Intravenous Given 04/06/22 2118)  lactated ringers bolus 500 mL (0 mLs Intravenous Stopped 04/06/22 2246)  iohexol (OMNIPAQUE) 300 MG/ML solution 100 mL (100 mLs Intravenous Contrast Given 04/06/22 2146)  azithromycin (ZITHROMAX) tablet 500 mg (500 mg Oral Given 04/06/22 2334)    ED Course/ Medical Decision Making/ A&P                           Medical Decision Making Amount and/or Complexity of Data Reviewed Labs: ordered. Radiology: ordered.  Risk Prescription drug management.  71 year old female with a history of COPD, bipolar disorder, adrenal nodule, chronic pain syndrome, bipolar disorder, diabetes, restless leg syndrome, history of prior episode of colitis, and also noted a history of seeing Dr. Loletha Carrow of gastroenterology for abdominal pain and diarrhea, who presents with concern for abdominal pain and diarrhea.  DDx includes appendicitis, pancreatitis, cholecystitis, pyelonephritis, nephrolithiasis, diverticulitis, colitis, SBO.  Labs were obtained and personally evaluated and interpreted by me and showed no sign of pancreatitis,  hepatitis, clinically significant electrolyte abnormality, or acute kidney injury.  Urinalysis not consistent with urinary tract infection.  CBC is significant for white count of 15,000.  CT abdomen pelvis ordered given more focal left lower quadrant abdominal pain and shows no evidence of diverticulitis, colitis, or other acute intra-abdominal process.  Noted to have fatty infiltration of the liver, trace bilateral pleural effusions.  She denies chest pain or shortness of breath, doubt cardiac etiology of nausea.  She has abdominal tenderness and significant diarrhea, suspect most likely infectious gastroenteritis as etiology of pain and diarrhea.  Have low suspicion for C. difficile at this time given no recent antibiotics or hospitalizations.  Did order stool testing, however we are not able to complete this while she was in the emergency department.  Given overall low suspicion for C. difficile, age, significant diarrhea, will empirically treat with azithromycin.  Given prescription for Bentyl for pain, Zofran for nausea, recommend primary care physician follow-up.        Final Clinical Impression(s) / ED Diagnoses Final diagnoses:  Diarrhea of presumed infectious origin  Generalized abdominal pain    Rx / DC Orders ED Discharge Orders          Ordered    azithromycin (ZITHROMAX) 250 MG tablet  Daily        04/06/22 2320    ondansetron (ZOFRAN-ODT) 4 MG disintegrating tablet  Every 8 hours PRN        04/06/22 2320    dicyclomine (BENTYL) 20 MG tablet  2 times daily PRN        04/06/22 2320              Gareth Morgan, MD 04/06/22 2346

## 2022-04-06 NOTE — ED Triage Notes (Addendum)
Belching, Nausea, Diarrhea, abdominal paint started yesterday. Had Colitis in Jan. 2022 and patient states she felt the same way then as she does now.

## 2022-04-06 NOTE — ED Triage Notes (Signed)
Pt is here for abdominal pain which began yesterday.  Pt states that she thinks she has colitis.  Pt has had diarrhea with this which is now watery

## 2022-04-06 NOTE — Progress Notes (Signed)
Virtual Visit Consent   [name], you are scheduled for a virtual visit with a [facility] provider today. Just as with appointments in the office, your consent must be obtained to participate. Your consent will be active for this visit and any virtual visit you may have with one of our providers in the next 365 days. If you have a MyChart account, a copy of this consent can be sent to you electronically.  As this is a virtual visit, video technology does not allow for your provider to perform a traditional examination. This may limit your provider's ability to fully assess your condition. If your provider identifies any concerns that need to be evaluated in person or the need to arrange testing (such as labs, EKG, etc.), we will make arrangements to do so. Although advances in technology are sophisticated, we cannot ensure that it will always work on either your end or our end. If the connection with a video visit is poor, the visit may have to be switched to a telephone visit. With either a video or telephone visit, we are not always able to ensure that we have a secure connection.  By engaging in this virtual visit, you consent to the provision of healthcare and authorize for your insurance to be billed (if applicable) for the services provided during this visit. Depending on your insurance coverage, you may receive a charge related to this service.  I need to obtain your verbal consent now. Are you willing to proceed with your visit today? [name] has provided verbal consent on 04/06/2022 for a virtual visit (video or telephone). Leeanne Rio, [place]  Date: 04/06/2022 6:23 PM  Virtual Visit via Video Note   I, Leeanne Rio, connected with  [name]  ([id number], [birth year]) on 04/06/22 at  6:15 PM EDT by a video-enabled telemedicine application and verified that I am speaking with the correct person using two identifiers.  Location: Patient: Virtual Visit Location  Patient: Home Provider: Virtual Visit Location Provider: Home Office   I discussed the limitations of evaluation and management by telemedicine and the availability of in person appointments. The patient expressed understanding and agreed to proceed.    History of Present Illness: [name] is a 71 y.o. who identifies as a female who was assigned female at birth, and is being seen today for significant abdominal pressure and pain with nausea, frequent belching, anorexia and diarrhea starting yesterday. Has history of colitis and unsure if this is another flare up of symptoms. She is unsure of fever. Has not had vomiting but is so nauseated she has barely had anything to eat in 2 days. Called her PCP office this morning who recommended ER or at least in-person evaluation but she declined at that time.     HPI: HPI  Problems:  Patient Active Problem List   Diagnosis Date Noted   Polypharmacy 04/29/2021   Arteriosclerosis of abdominal aorta ([place]) 12/02/2020   Urinary incontinence, mixed 04/02/2020   Diabetes mellitus type 2 in obese ([place]) 06/30/2019   Type 2 diabetes mellitus without complications ([place]) 64/40/3474   Spinal stenosis of lumbar region 06/21/2016   Encounter for chronic pain management 25/95/6387   Metabolic syndrome 56/43/3295   Hepatic steatosis 04/12/2011    Class: Chronic   Vitamin D deficiency 03/03/2010   Hyperlipidemia associated with type 2 diabetes mellitus ([place]) 12/24/2008   Chronic pain syndrome 12/24/2008   Functional gastrointestinal disorder 06/18/2008   HYPERTRIGLYCERIDEMIA 11/11/2007  Obesity, Class III, BMI 40-49.9 (morbid obesity) ([place]) 09/30/2007   RESTLESS LEGS SYNDROME 09/11/2007   Fibromyalgia syndrome 07/15/2007   Degenerative joint disease of both hips 05/23/2007   Hypothyroidism 02/15/2007   Bipolar 2 disorder ([place]) 01/03/2007   Hypertension associated with diabetes ([place]) 01/03/2007   Allergic rhinitis 01/03/2007   GERD without  esophagitis 01/03/2007   Irritable bowel syndrome 01/03/2007   Osteoarthritis of spine 01/03/2007    Allergies:  Allergies  Allergen Reactions   Diclofenac-Misoprostol Shortness Of Breath    elevated blood pressure   Aripiprazole     Doesn't want to take because of possible side effects   Emsam [Selegiline] Other (See Comments)    Burned the skin after using for a year   Estradiol Swelling   Ibuprofen Hives   Naproxen Nausea Only   Neurontin [Gabapentin] Hives and Swelling   Paroxetine Other (See Comments)    Panic attacks   Prednisone Other (See Comments)    Severe depression   Tape Other (See Comments)    Paper tape caused blisters, plastic tape ok   Medications:  Current Outpatient Medications:    atorvastatin (LIPITOR) 20 MG tablet, Take 1 tablet (20 mg total) by mouth daily., Disp: 90 tablet, Rfl: 3   buPROPion ER (WELLBUTRIN SR) 100 MG 12 hr tablet, Take 100 mg by mouth 2 (two) times daily., Disp: , Rfl:    Calcium Carb-Cholecalciferol (CALCIUM-VITAMIN D) 600-400 MG-UNIT TABS, Take 1 tablet by mouth daily., Disp: , Rfl:    Carboxymethylcellulose Sodium (THERATEARS) 0.25 % SOLN, Place 1 drop into both eyes daily as needed (dry eyes)., Disp: , Rfl:    citalopram (CELEXA) 20 MG tablet, Take 20 mg by mouth at bedtime., Disp: , Rfl:    Continuous Blood Gluc Sensor (FREESTYLE LIBRE 14 DAY SENSOR) MISC, APPLY EVERY 14 DAYS, Disp: 2 each, Rfl: PRN   fexofenadine (ALLEGRA) 180 MG tablet, Take 180 mg by mouth daily., Disp: , Rfl:    fluticasone (FLONASE) 50 MCG/ACT nasal spray, Place 2 sprays into both nostrils daily., Disp: , Rfl:    FLUZONE HIGH-DOSE QUADRIVALENT 0.7 ML SUSY, , Disp: , Rfl:    hydrochlorothiazide (HYDRODIURIL) 25 MG tablet, TAKE 1 TABLET BY MOUTH EVERY DAY, Disp: 90 tablet, Rfl: 3   HYDROcodone-acetaminophen (NORCO) 10-325 MG tablet, Take 1 tablet by mouth every 6 (six) hours as needed for moderate pain., Disp: 30 tablet, Rfl: 0   hydrOXYzine (VISTARIL) 50 MG  capsule, Take 150 mg by mouth at bedtime. , Disp: , Rfl:    ketoconazole (NIZORAL) 2 % cream, APPLY TOPICALLY TO AFFECTED AREA EVERY DAY (Patient taking differently: Apply 1 application. topically daily as needed for irritation.), Disp: 15 g, Rfl: 0   KLOR-CON M10 10 MEQ tablet, TAKE 4 TABLETS (40 MEQ TOTAL) BY MOUTH AT BEDTIME., Disp: 360 tablet, Rfl: 3   levothyroxine (SYNTHROID) 125 MCG tablet, TAKE 1 TABLET (125 MCG TOTAL) BY MOUTH EVERY MORNING. 30 MINUTES BEFORE FOOD, Disp: 90 tablet, Rfl: 3   loperamide (IMODIUM) 2 MG capsule, TAKE 1 CAPSULE (2 MG TOTAL) BY MOUTH AS NEEDED FOR DIARRHEA OR LOOSE STOOLS., Disp: 30 capsule, Rfl: 0   LORazepam (ATIVAN) 0.5 MG tablet, Take 0.5 mg by mouth 3 (three) times daily as needed., Disp: , Rfl:    metoprolol tartrate (LOPRESSOR) 50 MG tablet, TAKE 1 TABLET BY MOUTH TWICE A DAY, Disp: 180 tablet, Rfl: 3   morphine (MS CONTIN) 15 MG 12 hr tablet, Take 1 tablet (15 mg total) by mouth  every 12 (twelve) hours., Disp: 60 tablet, Rfl: 0   Multiple Vitamins-Minerals (MULTIVITAMIN WITH MINERALS) tablet, Take 1 tablet by mouth daily., Disp: , Rfl:    naloxone (NARCAN) nasal spray 4 mg/0.1 mL, PLEASE SEE ATTACHED FOR DETAILED DIRECTIONS, Disp: , Rfl:    omeprazole (PRILOSEC) 40 MG capsule, TAKE 1 CAPSULE BY MOUTH EVERY DAY, Disp: 90 capsule, Rfl: 3   OZEMPIC, 0.25 OR 0.5 MG/DOSE, 2 MG/3ML SOPN, INJECT 0.25 MG INTO THE SKIN ONCE A WEEK FOR 4 DOSES, Disp: 3 mL, Rfl: 0   PFIZER COVID-19 VAC BIVALENT injection, , Disp: , Rfl:    polyethylene glycol powder (GLYCOLAX/MIRALAX) 17 GM/SCOOP powder, Take 17 g by mouth as needed for mild constipation., Disp: , Rfl:    prazosin (MINIPRESS) 2 MG capsule, Take 2 mg by mouth at bedtime., Disp: , Rfl:    Probiotic Product (PROBIOTIC DAILY PO), Take 1 capsule by mouth daily., Disp: , Rfl:    prochlorperazine (COMPAZINE) 10 MG tablet, TAKE ONE TABLET BY MOUTH TWICE A DAY AS NEEDED FOR NAUSEA OR VOMITING (Patient taking differently: Take  10 mg by mouth 2 (two) times daily as needed for nausea or vomiting. TAKE ONE TABLET BY MOUTH TWICE A DAY AS NEEDED FOR NAUSEA OR VOMITING), Disp: 30 tablet, Rfl: 1   ramipril (ALTACE) 10 MG capsule, TAKE 1 CAPSULE BY MOUTH EVERY DAY, Disp: 90 capsule, Rfl: 3   senna-docusate (SENOKOT-S) 8.6-50 MG tablet, Take 2 tablets by mouth at bedtime. For AFTER surgery, do not take if having diarrhea, Disp: 30 tablet, Rfl: 0   simethicone (MYLICON) 80 MG chewable tablet, Chew 160 mg by mouth daily as needed (gas)., Disp: , Rfl:    solifenacin (VESICARE) 5 MG tablet, Take 1 tablet (5 mg total) by mouth daily., Disp: 90 tablet, Rfl: 3   tiZANidine (ZANAFLEX) 2 MG tablet, TAKE 1 TABLET BY MOUTH EVERYDAY AT BEDTIME, Disp: 90 tablet, Rfl: 1   traZODone (DESYREL) 50 MG tablet, Take 150 mg by mouth at bedtime., Disp: , Rfl:    Vibegron 75 MG TABS, Take 1 tablet by mouth., Disp: , Rfl:    vitamin B-12 (CYANOCOBALAMIN) 1000 MCG tablet, Take 1,000 mcg by mouth daily., Disp: , Rfl:   Observations/Objective: Patient is obese, visibly uncomfortable but no acute distress at this time. .  Pacing during visit.  Head is normocephalic, atraumatic.  No labored breathing. Speech is clear and coherent with logical content.  Patient is alert and oriented at baseline.  Belching almost constantly throughout the visit.    Assessment and Plan: 1. Generalized abdominal pain  With significant nausea, anorexia, pressure and weakness. History of colitis. Needs further evaluation than what can be safely done via video. Discussed ER but she is not amenable to this. Agrees to at least be [facility] at [facility]. Resources given. Is having her brother take her now.  Follow Up Instructions: I discussed the assessment and treatment plan with the patient. The patient was provided an opportunity to ask questions and all were answered. The patient agreed with the plan and demonstrated an understanding of the instructions.  A copy of instructions  were sent to the patient via MyChart unless otherwise noted below.   The patient was advised to call back or seek an in-person evaluation if the symptoms worsen or if the condition fails to improve as anticipated.  Time:  I spent 10 minutes with the patient via telehealth technology discussing the above problems/concerns.    Leeanne Rio, PA-C

## 2022-04-06 NOTE — Telephone Encounter (Signed)
Patient calls nurse line reporting a "colitis flare."   Patient reports symptoms started yesterday with 8 episodes of diarrhea and a "few" episodes morning. Patient reports nausea, however denies vomiting. Patient reports a lot of bloating and belching. Patient denies bloody stools.  Patient reports she is feeling somewhat better today, however is very weak. Patient reports she has been staying hydrated.   Patient reports fasting blood sugar this morning was 207.   Patient advised she needs to be evaluated. Patent declined an apt or ED visit.   Will forward to PCP.

## 2022-04-06 NOTE — ED Provider Notes (Signed)
Patient presents to urgent care complaining of symptoms similar to those she had when she was diagnosed with colitis via CT abdomen pelvis in January 2022.  Patient was advised to check again but we would not be able to order CT scan here, patient decided to be seen anyway.  Due to her body habitus, abdominal exam will yield very little information today.  I advised patient that she needs to go to the emergency room CT of her abdomen.  Patient verbalized understanding.   Lynden Oxford Scales, [place] 04/06/22 [phone]

## 2022-04-06 NOTE — ED Notes (Signed)
Bleaching, Nausea, Diarrhea, abdominal paint started yesterday. Had Colitis in Jan. 2022 and patient states she felt the same way then as she does now.

## 2022-04-06 NOTE — ED Notes (Signed)
Patient transported to CT 

## 2022-04-11 ENCOUNTER — Encounter: Payer: Self-pay | Admitting: *Deleted

## 2022-04-13 ENCOUNTER — Other Ambulatory Visit: Payer: Self-pay | Admitting: Family Medicine

## 2022-04-13 DIAGNOSIS — E039 Hypothyroidism, unspecified: Secondary | ICD-10-CM

## 2022-04-15 ENCOUNTER — Other Ambulatory Visit: Payer: Self-pay | Admitting: Family Medicine

## 2022-04-15 DIAGNOSIS — E119 Type 2 diabetes mellitus without complications: Secondary | ICD-10-CM

## 2022-04-19 ENCOUNTER — Encounter: Payer: Self-pay | Admitting: Family Medicine

## 2022-04-19 ENCOUNTER — Ambulatory Visit (INDEPENDENT_AMBULATORY_CARE_PROVIDER_SITE_OTHER): Payer: PPO | Admitting: Family Medicine

## 2022-04-19 DIAGNOSIS — K929 Disease of digestive system, unspecified: Secondary | ICD-10-CM

## 2022-04-19 MED ORDER — PROCHLORPERAZINE MALEATE 10 MG PO TABS: 10.0000 mg | ORAL_TABLET | Freq: Two times a day (BID) | ORAL | 1 refills | Status: DC | PRN

## 2022-04-19 MED ORDER — DICYCLOMINE HCL 20 MG PO TABS: 20.0000 mg | ORAL_TABLET | Freq: Two times a day (BID) | ORAL | 0 refills | Status: DC | PRN

## 2022-04-19 NOTE — Patient Instructions (Signed)
It was wonderful to see you today.  Please bring ALL of your medications with you to every visit.   Today we talked about:  Bentyl 20 mg twice daily and compazine 10 mg twice daily for 7 days, then stop and please call if symptoms persisting. Do not take more than twice per day.  If you have fevers chills blood in stool persistent vomiting or worsening abdominal pain please go to the ED    Thank you for choosing [facility].   Please call [phone] with any questions about today's appointment.  Please be sure to schedule follow up at the front  desk before you leave today.   Please arrive at least 15 minutes prior to your scheduled appointments.   If you had blood work today, I will send you a MyChart message or a letter if results are normal. Otherwise, I will give you a call.   If you had a referral placed, they will call you to set up an appointment. Please give [place] a call if you don't hear back in the next 2 weeks.   If you need additional refills before your next appointment, please call your pharmacy first.   Yehuda Savannah, MD  Family Medicine

## 2022-04-19 NOTE — Progress Notes (Signed)
    SUBJECTIVE:   CHIEF COMPLAINT / HPI:   Belching- since ED visit on 04/06/22. No vomiting or blood in stool.  She notes she took azithromycin and Bentyl from the ED and when she was done with it a week and a half ago she felt the best she had felt in a while.  Now she is no longer having diarrhea but she still feels full and is belching a lot.  She had previously weaned off the Bentyl with Dr. Vikki Ports but felt like it helped.  She takes omeprazole in the evening but it still happening.  She notes feeling nauseous and she took some Zofran this morning but felt better.  She does not have any more of her Compazine.  She was taking her daughter's Phenergan which helped some.  PERTINENT  PMH / PSH: functional gastrointestinal disorder  OBJECTIVE:   BP 138/72   Pulse 71   Wt 255 lb (115.7 kg)   SpO2 95%   BMI 43.77 kg/m   General: A&O, NAD HEENT: No sign of trauma, EOM grossly intact Cardiac: RRR, no m/r/g Respiratory: CTAB, normal WOB, no w/c/r GI: Soft, nontender to palpation, non-distended, positive bowel sounds, no guarding or rebound Extremities: NTTP, no peripheral edema. Neuro: Normal gait, moves all four extremities appropriately. Psych: Appropriate mood and affect   ASSESSMENT/PLAN:   Functional gastrointestinal disorder - Suspect a flare of her functional gastrointestinal disorder, as she had normal lab work and a normal CT scan 2 weeks ago and symptoms have persisted, no fevers or signs of infection today, will restart Compazine and Bentyl for 1 week, given a total of 2-week prescription, and instructed to make follow-up appointment if not improved at the end of the week - Discussed ER return precautions including fevers, chills, blood in stool, worsening abdominal pain, persistent vomiting and unable to take p.o.     Lenoria Chime, MD [place]

## 2022-04-19 NOTE — Assessment & Plan Note (Signed)
-   Suspect a flare of her functional gastrointestinal disorder, as she had normal lab work and a normal CT scan 2 weeks ago and symptoms have persisted, no fevers or signs of infection today, will restart Compazine and Bentyl for 1 week, given a total of 2-week prescription, and instructed to make follow-up appointment if not improved at the end of the week - Discussed ER return precautions including fevers, chills, blood in stool, worsening abdominal pain, persistent vomiting and unable to take p.o.

## 2022-04-21 ENCOUNTER — Encounter: Payer: Self-pay | Admitting: Gastroenterology

## 2022-04-27 ENCOUNTER — Encounter: Payer: Self-pay | Admitting: Family Medicine

## 2022-04-27 DIAGNOSIS — M48061 Spinal stenosis, lumbar region without neurogenic claudication: Secondary | ICD-10-CM

## 2022-04-27 DIAGNOSIS — M160 Bilateral primary osteoarthritis of hip: Secondary | ICD-10-CM

## 2022-04-27 DIAGNOSIS — M47896 Other spondylosis, lumbar region: Secondary | ICD-10-CM

## 2022-04-27 DIAGNOSIS — G2581 Restless legs syndrome: Secondary | ICD-10-CM

## 2022-04-27 DIAGNOSIS — E1169 Type 2 diabetes mellitus with other specified complication: Secondary | ICD-10-CM

## 2022-04-27 DIAGNOSIS — G894 Chronic pain syndrome: Secondary | ICD-10-CM

## 2022-04-27 DIAGNOSIS — E119 Type 2 diabetes mellitus without complications: Secondary | ICD-10-CM

## 2022-04-29 ENCOUNTER — Encounter: Payer: Self-pay | Admitting: Family Medicine

## 2022-04-29 DIAGNOSIS — K529 Noninfective gastroenteritis and colitis, unspecified: Secondary | ICD-10-CM

## 2022-04-29 DIAGNOSIS — K580 Irritable bowel syndrome with diarrhea: Secondary | ICD-10-CM

## 2022-05-01 MED ORDER — DICYCLOMINE HCL 20 MG PO TABS: 20.0000 mg | ORAL_TABLET | Freq: Three times a day (TID) | ORAL | 0 refills | Status: DC | PRN

## 2022-05-01 MED ORDER — MORPHINE SULFATE ER 15 MG PO TBCR: 15.0000 mg | EXTENDED_RELEASE_TABLET | Freq: Two times a day (BID) | ORAL | 0 refills | Status: DC

## 2022-05-01 MED ORDER — HYDROCODONE-ACETAMINOPHEN 10-325 MG PO TABS: 1.0000 | ORAL_TABLET | Freq: Four times a day (QID) | ORAL | 0 refills | Status: DC | PRN

## 2022-05-04 ENCOUNTER — Encounter: Payer: Self-pay | Admitting: Family Medicine

## 2022-05-04 DIAGNOSIS — M160 Bilateral primary osteoarthritis of hip: Secondary | ICD-10-CM

## 2022-05-04 DIAGNOSIS — G2581 Restless legs syndrome: Secondary | ICD-10-CM

## 2022-05-04 DIAGNOSIS — G894 Chronic pain syndrome: Secondary | ICD-10-CM

## 2022-05-04 DIAGNOSIS — M48061 Spinal stenosis, lumbar region without neurogenic claudication: Secondary | ICD-10-CM

## 2022-05-04 DIAGNOSIS — M47896 Other spondylosis, lumbar region: Secondary | ICD-10-CM

## 2022-05-04 NOTE — Telephone Encounter (Signed)
Called and canceled prescription at [facility]. Pended refill to [facility] on [address].   Talbot Grumbling, RN

## 2022-05-05 MED ORDER — HYDROCODONE-ACETAMINOPHEN 10-325 MG PO TABS: 1.0000 | ORAL_TABLET | Freq: Four times a day (QID) | ORAL | 0 refills | Status: DC | PRN

## 2022-05-18 ENCOUNTER — Ambulatory Visit (AMBULATORY_SURGERY_CENTER): Payer: Self-pay | Admitting: *Deleted

## 2022-05-18 ENCOUNTER — Ambulatory Visit (INDEPENDENT_AMBULATORY_CARE_PROVIDER_SITE_OTHER): Payer: PPO | Admitting: Family Medicine

## 2022-05-18 ENCOUNTER — Encounter: Payer: Self-pay | Admitting: Family Medicine

## 2022-05-18 VITALS — Ht 64.0 in | Wt 257.0 lb

## 2022-05-18 VITALS — BP 110/61 | HR 55 | Ht 64.0 in | Wt 254.4 lb

## 2022-05-18 DIAGNOSIS — F3181 Bipolar II disorder: Secondary | ICD-10-CM | POA: Diagnosis not present

## 2022-05-18 DIAGNOSIS — M48061 Spinal stenosis, lumbar region without neurogenic claudication: Secondary | ICD-10-CM

## 2022-05-18 DIAGNOSIS — E1159 Type 2 diabetes mellitus with other circulatory complications: Secondary | ICD-10-CM | POA: Diagnosis not present

## 2022-05-18 DIAGNOSIS — E119 Type 2 diabetes mellitus without complications: Secondary | ICD-10-CM

## 2022-05-18 DIAGNOSIS — Z86010 Personal history of colonic polyps: Secondary | ICD-10-CM

## 2022-05-18 DIAGNOSIS — Z79899 Other long term (current) drug therapy: Secondary | ICD-10-CM | POA: Diagnosis not present

## 2022-05-18 DIAGNOSIS — G8929 Other chronic pain: Secondary | ICD-10-CM

## 2022-05-18 DIAGNOSIS — I152 Hypertension secondary to endocrine disorders: Secondary | ICD-10-CM

## 2022-05-18 LAB — POCT GLYCOSYLATED HEMOGLOBIN (HGB A1C): HbA1c, POC (controlled diabetic range): 6.1 % (ref 0.0–7.0)

## 2022-05-18 MED ORDER — NA SULFATE-K SULFATE-MG SULF 17.5-3.13-1.6 GM/177ML PO SOLN: 2.0000 | Freq: Once | ORAL | 0 refills | Status: AC

## 2022-05-18 NOTE — Patient Instructions (Addendum)
Your blood sugar and blood pressure are good.  Keep taking your medications like you are.   We will talk about reducing your MS Contin in the future.   Try using th FODMAP diet to help with your stomach and intestine symptoms.  Experiment removing one of the food to avoid and add one of the foods to enjoy.   Cannabinoid Hyperemesis Syndrome Cannabinoid hyperemesis syndrome (CHS) is a condition that causes repeated nausea, vomiting, and abdominal pain after long-term (chronic) use of marijuana (cannabis). People with CHS typically use marijuana 3-5 times a day for many years before they have symptoms, although it is possible to develop CHS with far less daily use. Symptoms of CHS may be mild at first but can get worse and more frequent. In some cases, CHS may cause severe daily vomiting, which can lead to weight loss and dehydration. What are the causes? The exact cause of this condition is not known. Long-term use of marijuana may over-stimulate certain proteins in the brain and gastrointestinal tract that react with chemicals in marijuana (cannabinoid receptors). This over-stimulation may cause CHS. What are the signs or symptoms? Symptoms of this condition are often mild during the first few episodes, but they can get worse over time. Symptoms may include: Frequent nausea, especially early in the morning. Vomiting. This can become severe. Abdominal pain. Feeling very tired (lethargic). Headaches. CHS may go away and come back many times (recur). People may not have symptoms or may otherwise be healthy in between [facility] episodes. Taking hot showers can relieve the symptoms of CHS, so feeling the need to take several hot showers throughout the day can be a sign of this condition. How is this diagnosed? This condition may be diagnosed based on: Your symptoms and medical history, including any drug use. A physical exam. You may have tests done to rule out other problems that could cause your  symptoms. These tests may include: Blood tests. Urine tests. Imaging tests, such as an X-ray or a CT scan. How is this treated? Treatment for this condition involves stopping marijuana use. Treatment may include: A drug rehabilitation program, if you have trouble stopping marijuana use. Medicines for nausea. These may be given at the hospital through an IV inserted into one of your veins, or they may be medicines that you take by mouth (orally). Certain creams that contain a substance called capsaicin. These may improve symptoms when applied to the abdomen. Hot showers to help relieve symptoms. In severe cases, you may need treatment at a hospital. You may be given IV fluids to prevent or treat dehydration as well as medicines to treat nausea, vomiting, and pain. Follow these instructions at home: During an episode of CHS  Stay in bed and rest in a dark, quiet room. Take anti-nausea medicine as told by your health care provider. Try taking hot showers to relieve your symptoms. After an episode of CHS Drink small amounts of clear fluids slowly. Gradually add more if you can keep the fluids down without vomiting. Once you are able to eat without vomiting, eat soft foods in small amounts every 3-4 hours. General instructions  Do not use any products that contain marijuana.If you need help quitting, ask your health care provider for resources and treatment options. Drink enough fluid to keep your urine pale yellow. Avoid drinking fluids that have a lot of sugar or caffeine, such as coffee and soda. Take and apply over-the-counter and prescription medicines only as told by your health care provider.  Ask your health care provider before starting any new medicines or treatments. Keep all follow-up visits as told by your health care provider. This is important. This includes any recommended substance abuse programs. Contact a health care provider if: Your symptoms get worse. You cannot drink  fluids without vomiting or severe pain. You have pain and trouble swallowing after an episode. Get help right away if you: Cannot stop vomiting. Have blood in your vomit or your vomit looks like coffee grounds. Have severe abdominal pain. Have stools that are bloody or black, or stools that look like tar. Have symptoms of dehydration, such as: Sunken eyes. Inability to make tears. Cracked lips. Dry mouth. Decreased urine production. Weakness. Sleepiness. Dizziness, light-headedness, or fainting. Summary Cannabinoid hyperemesis syndrome (CHS) is a condition that causes repeated nausea, vomiting, and abdominal pain after long-term use of marijuana. People with CHS typically use marijuana 3-5 times a day for many years before they have symptoms, although it is possible to develop CHS with much less daily use. Treatment for this condition involves stopping marijuana use. Hot showers and capsaicin creams may also help relieve symptoms. Ask your health care provider before starting any medicines or other treatments. Your health care provider may prescribe medicines to help with nausea. Get help right away if you have signs of dehydration, such as dry mouth, decreased urine production, weakness, dizziness, and light-headedness. This information is not intended to replace advice given to you by your health care provider. Make sure you discuss any questions you have with your health care provider. Document Revised: 09/16/2021 Document Reviewed: 08/20/2019 Elsevier Patient Education  [place].

## 2022-05-18 NOTE — Progress Notes (Signed)
No egg or soy allergy known to patient  No issues known to pt with past sedation with any surgeries or procedures Patient denies ever being told they had issues or difficulty with intubation  No FH of Malignant Hyperthermia Pt is not on diet pills Pt is not on  home 02  Pt is not on blood thinners  Pt does have some issues with constipation, instructed to take daily Miralax for 5-7days prior to procedure. No A fib or A flutter   Discussed with pt there will be an out-of-pocket cost for prep and that varies from $0 to 70 +  dollars - pt verbalized understanding  Pt instructed to use Singlecare.com or GoodRx for a price reduction on prep   PV completed in person. Pt verified name, DOB.  Procedure explained to pt. Prep instructions reviewed, questions answered. Pt encouraged to call with questions or issues.  If pt has My chart, procedure instructions sent via My Chart

## 2022-05-19 ENCOUNTER — Encounter: Payer: Self-pay | Admitting: Family Medicine

## 2022-05-19 NOTE — Assessment & Plan Note (Signed)
Established problem. Adequate glycemic control.  Pt is tolerating the current medication regiment. Continue current treatment plan.              

## 2022-05-19 NOTE — Progress Notes (Signed)
[name] is alone Sources of clinical information for visit is/are patient. Nursing assessment for this office visit was reviewed with the patient for accuracy and revision.     Previous Report(s) Reviewed: ED visit 04/06/22 for abdomin pain and diarhea tx'd empirically with Z-pak    05/18/2022   11:00 AM  Depression screen PHQ 2/9  Decreased Interest 2  Down, Depressed, Hopeless 2  PHQ - 2 Score 4  Altered sleeping 1  Tired, decreased energy 2  Change in appetite 1  Feeling bad or failure about yourself  2  Trouble concentrating 1  Moving slowly or fidgety/restless 0  Suicidal thoughts 0  PHQ-9 Score 11  Difficult doing work/chores Very difficult   [place] Visit from 05/18/2022 in [place] Office Visit from 04/19/2022 in [place] Office Visit from 03/02/2022 in [place]  Thoughts that you would be better off dead, or of hurting yourself in some way Not at all Not at all Not at all  PHQ-9 Total Score '11 17 17          '$ 05/18/2022   11:01 AM 04/19/2022   11:25 AM 03/02/2022   11:01 AM 01/11/2022    4:17 PM 12/01/2021   10:13 AM  [place] in the past year? 0 0 0 0 0  Number falls in past yr: 0 0 0 0 0  Injury with Fall? 0 0 0 0 0  Risk for fall due to :    Impaired balance/gait   Follow up    Falls prevention discussed        05/18/2022   11:00 AM 04/19/2022   11:25 AM 03/02/2022   10:58 AM  PHQ9 SCORE ONLY  PHQ-9 Total Score '11 17 17    '$ Adult vaccines due  Topic Date Due   TETANUS/TDAP  10/21/2023    Health Maintenance Due  Topic Date Due   FOOT EXAM  09/16/2021      History/P.E. limitations: none  Adult vaccines due  Topic Date Due   TETANUS/TDAP  10/21/2023    Diabetes Health Maintenance Due  Topic Date Due   FOOT EXAM  09/16/2021   OPHTHALMOLOGY EXAM  06/17/2022   HEMOGLOBIN A1C  11/18/2022    Health Maintenance Due  Topic Date Due   FOOT EXAM   09/16/2021     Chief Complaint  Patient presents with   Follow-up    a1c    Diabetic Foot Exam - Simple   Simple Foot Form  05/18/2022 12:00 PM  Visual Inspection No deformities, no ulcerations, no other skin breakdown bilaterally: Yes Sensation Testing Intact to touch and monofilament testing bilaterally: Yes Pulse Check Posterior Tibialis and Dorsalis pulse intact bilaterally: Yes Comments

## 2022-05-19 NOTE — Assessment & Plan Note (Signed)
Established problem Controlled pain which allows completion of ADLs and iADLs No adverse effects No aberrant behaviors Continue current therapy regiment.

## 2022-05-19 NOTE — Assessment & Plan Note (Signed)
Established problem. Adequate blood pressure control.  No evidence of new end organ damage.  Tolerating medication without significant adverse effects.  Plan to continue current blood pressure medication regiment.   

## 2022-05-19 NOTE — Assessment & Plan Note (Signed)
Discussed plan to decrease MS Contin in coming months.

## 2022-05-19 NOTE — Assessment & Plan Note (Addendum)
Established problem. Mood is more up.  Not sleeping as much.  PHQ 11 is down from 6/14/232 17 score Stable. Patient is at goal. No signs of complications, medication side effects, or red flags. Continue current medications and other regiments.  Ms Scali is to see Dr Reece Levy in mid-August.

## 2022-05-27 ENCOUNTER — Other Ambulatory Visit: Payer: Self-pay | Admitting: Family Medicine

## 2022-05-27 DIAGNOSIS — N3946 Mixed incontinence: Secondary | ICD-10-CM

## 2022-05-31 ENCOUNTER — Encounter: Payer: Self-pay | Admitting: Family Medicine

## 2022-05-31 DIAGNOSIS — M47896 Other spondylosis, lumbar region: Secondary | ICD-10-CM

## 2022-05-31 DIAGNOSIS — M48061 Spinal stenosis, lumbar region without neurogenic claudication: Secondary | ICD-10-CM

## 2022-05-31 DIAGNOSIS — M160 Bilateral primary osteoarthritis of hip: Secondary | ICD-10-CM

## 2022-05-31 DIAGNOSIS — G894 Chronic pain syndrome: Secondary | ICD-10-CM

## 2022-05-31 DIAGNOSIS — G2581 Restless legs syndrome: Secondary | ICD-10-CM

## 2022-06-03 ENCOUNTER — Telehealth: Payer: Self-pay | Admitting: Family Medicine

## 2022-06-03 DIAGNOSIS — M160 Bilateral primary osteoarthritis of hip: Secondary | ICD-10-CM

## 2022-06-03 DIAGNOSIS — G894 Chronic pain syndrome: Secondary | ICD-10-CM

## 2022-06-03 DIAGNOSIS — M47896 Other spondylosis, lumbar region: Secondary | ICD-10-CM

## 2022-06-03 DIAGNOSIS — G2581 Restless legs syndrome: Secondary | ICD-10-CM

## 2022-06-03 DIAGNOSIS — M48061 Spinal stenosis, lumbar region without neurogenic claudication: Secondary | ICD-10-CM

## 2022-06-03 MED ORDER — MORPHINE SULFATE ER 15 MG PO TBCR: 15.0000 mg | EXTENDED_RELEASE_TABLET | Freq: Two times a day (BID) | ORAL | 0 refills | Status: DC

## 2022-06-03 MED ORDER — HYDROCODONE-ACETAMINOPHEN 10-325 MG PO TABS: 1.0000 | ORAL_TABLET | Freq: Four times a day (QID) | ORAL | 0 refills | Status: DC | PRN

## 2022-06-03 NOTE — Telephone Encounter (Signed)
AFTER HOURS CALL  Patient calls in to after hours line regarding concern over potentially running out of chronic pain meds. Correct patient confirmed with phone number and date of birth.    She reports longstanding history of MS Contin 15 mg every 12 and hydrocodone/acetaminophen 10-325 nightly.  She messaged Dr. Wendy Poet on 7/26 regarding these refills that were due about 7/27, but she has not heard back yet.  She is fearful that she may run out of her chronic opioids and withdraw.  She reports she has 2 pills left of her MS Contin and 2 pills left of her Norco.  PDMP reviewed and appropriate, this appears to be a chronic stable medication.  I will provide prescription for # 10 tablets of each to ensure she does not withdrawal while awaiting full prescription from Dr. McDiarmid.  She requests to be sent to [facility] on [address] in [place] due to availability issues with her previous pharmacy.  Ezequiel Essex, MD

## 2022-06-05 ENCOUNTER — Telehealth: Payer: Self-pay | Admitting: Gastroenterology

## 2022-06-05 ENCOUNTER — Encounter: Payer: Self-pay | Admitting: Family Medicine

## 2022-06-05 MED ORDER — HYDROCODONE-ACETAMINOPHEN 10-325 MG PO TABS: 1.0000 | ORAL_TABLET | Freq: Four times a day (QID) | ORAL | 0 refills | Status: DC | PRN

## 2022-06-05 MED ORDER — MORPHINE SULFATE ER 15 MG PO TBCR: 15.0000 mg | EXTENDED_RELEASE_TABLET | Freq: Two times a day (BID) | ORAL | 0 refills | Status: DC

## 2022-06-05 NOTE — Telephone Encounter (Signed)
Inbound call from patient stating that she is scheduled to have a colonoscopy with Dr. Loletha Carrow on 8/3. Patient stated that since last Thursday she has had diarrhea. Patient is seeking advice if she needs to reschedule procedure or if she can have procedure. Please advise.

## 2022-06-05 NOTE — Telephone Encounter (Signed)
7-13 dr gave her information about [facility]- no marijuana since Tuesday of last week - have lost 11 lbs since 05-18-22, because drinking Gatorade and eggs, popsicles and ice  only - had diarrhea Thursday and Friday after drinking Miralax daily these two days - she is using there Miralax due to hydrocodone and Morphine use- uses compazine for nausea  PRN- she is asking if she needs to cancel her colon Thursday-  no fever, no bleeding   Informed her the Miralax may be causing her diarrhea and she needs and should continue it daily until Thursday to make sure her prep gets her cleaned out- could be loose stool passing around hard stool  so she wants to soften up all that may be in her colon- continue to hold high fiber foods, prep as  directed  but if she has fever  or any changes call us back - pt verbalized understanding

## 2022-06-06 ENCOUNTER — Encounter: Payer: Self-pay | Admitting: Gastroenterology

## 2022-06-08 ENCOUNTER — Encounter: Payer: Self-pay | Admitting: Gastroenterology

## 2022-06-08 ENCOUNTER — Ambulatory Visit (AMBULATORY_SURGERY_CENTER): Payer: PPO | Admitting: Gastroenterology

## 2022-06-08 VITALS — BP 135/81 | HR 63 | Temp 97.8°F | Resp 12 | Ht 64.0 in | Wt 257.0 lb

## 2022-06-08 DIAGNOSIS — D125 Benign neoplasm of sigmoid colon: Secondary | ICD-10-CM

## 2022-06-08 DIAGNOSIS — Z86010 Personal history of colonic polyps: Secondary | ICD-10-CM | POA: Diagnosis not present

## 2022-06-08 DIAGNOSIS — Z09 Encounter for follow-up examination after completed treatment for conditions other than malignant neoplasm: Secondary | ICD-10-CM

## 2022-06-08 DIAGNOSIS — J449 Chronic obstructive pulmonary disease, unspecified: Secondary | ICD-10-CM | POA: Diagnosis not present

## 2022-06-08 DIAGNOSIS — K635 Polyp of colon: Secondary | ICD-10-CM

## 2022-06-08 DIAGNOSIS — M797 Fibromyalgia: Secondary | ICD-10-CM | POA: Diagnosis not present

## 2022-06-08 DIAGNOSIS — F319 Bipolar disorder, unspecified: Secondary | ICD-10-CM | POA: Diagnosis not present

## 2022-06-08 DIAGNOSIS — F419 Anxiety disorder, unspecified: Secondary | ICD-10-CM | POA: Diagnosis not present

## 2022-06-08 MED ORDER — SODIUM CHLORIDE 0.9 % IV SOLN: 500.0000 mL | Freq: Once | INTRAVENOUS | Status: DC

## 2022-06-08 NOTE — Progress Notes (Signed)
Sedate, gd SR, tolerated procedure well, VSS, report to RN 

## 2022-06-08 NOTE — Progress Notes (Signed)
Called to room to assist during endoscopic procedure.  Patient ID and intended procedure confirmed with present staff. Received instructions for my participation in the procedure from the performing physician.  

## 2022-06-08 NOTE — Progress Notes (Signed)
History and Physical:  This patient presents for endoscopic testing for: Encounter Diagnosis  Name Primary?   Personal history of colonic polyps Yes    TA polyps x 6 in Aug 2020 Patient reports chronic N/V for which she has seen PCP - though to be cannabis hyperemesis. Also on chronic opioids.  Patient is otherwise without complaints or active issues today.   Past Medical History: Past Medical History:  Diagnosis Date   Adrenal adenoma, left 12/02/2020   CT AP 11/16/20:  left adrenal mass measuring approximately 2.7 cm. This is minimally increased in size from prior study and is consistent with a benign adrenal adenoma.    Adrenal nodule ([place]) 04/08/2011   04/08/11 Abdominal CT showed Left adrenal nodule which is tachnically indeterminate but most likely an adenoma.  It is 1.7 cm and 42 HU on portal venous phase image. 31 HU on delayed images.   01/09/2012 Abdominal CT w/ & w/o CM: Stable benign left adrenal adenoma. No further specific follow-up is needed for this finding.     Allergic rhinitis 01/03/2007   Qualifier: Diagnosis of  By: Drucie Ip     Allergy    Anemia    hx of  in 20's   Anxiety    Bipolar disorder ([place])    Carpal tunnel syndrome 02/14/2007   S/P carpel tunnel release bilaterally.     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 03/28/2018 03/28/2018   COPD WITHOUT EXACERBATION 01/03/2007   Qualifier: Diagnosis of  By: Drucie Ip  emphysema   Diabetes mellitus without complication ([place])    F1M 6.5 as of 05-2019- diet changes, no meds currently    DISC WITH RADICULOPATHY 01/03/2007   Qualifier: Diagnosis of  By: Drucie Ip     Emphysema of lung ([place])    Esophageal reflux 01/03/2007   Centricity Description: GASTROESOPHAGEAL REFLUX, NO ESOPHAGITIS Qualifier: Diagnosis of  By: Drucie Ip   Centricity Description: GERD Qualifier: Diagnosis  of  By: Tye Savoy MD, [place]     External hemorrhoids s/p hemorrhoidectomy 03/28/2018 03/28/2018   Family history of breast cancer    Family history of cancer of female genital organ    Family history of colon cancer    Family history of gene mutation    BRIP1 gene mutation (c.2010dup) in daughter   Family history of lung cancer    Fibromyalgia    Fracture of bone spur of inferior portion of calcaneus 10/06/2018   Functional gastrointestinal disorder 06/18/2008   Functional Gastrointestinal Disorder with frequent eructation and bloating sensation with acute flares.  Responsive to Compazine and Bentyl.  Takes a few days to calm down.     GERD without esophagitis 01/03/2007   Centricity Description: GASTROESOPHAGEAL REFLUX, NO ESOPHAGITIS Qualifier: Diagnosis of  By: Drucie Ip   Centricity Description: GERD Qualifier: Diagnosis of  By: Tye Savoy MD, [place]     Hearing impairment 04/28/2021   Heel spur, fracture, left 04/17/2018   HEMORRHOIDS, NOS 01/03/2007   Qualifier: Diagnosis of  By: Drucie Ip     Hepatic steatosis 04/12/2011   Herpes simplex labialis 05/11/2011   Hip osteoarthritis 05/23/2007   MRI Pelvis (03/13/07) Bilateral osteoarthritis of the hips, left greater than right.  The  patient has a slightly more prominent hip effusion on the left than  the right.  No other significant abnormality.     History of MRSA infection 04/28/2011  HYPERCHOLESTEROLEMIA 12/24/2008   Hypertension    HYPERTENSION, BENIGN SYSTEMIC 01/03/2007   Qualifier: Diagnosis of  By: Drucie Ip     HYPERTRIGLYCERIDEMIA 11/11/2007   Qualifier: Diagnosis of  By: McDiarmid MD, [place]     Hypokalemia 12/12/2017   Hypothyroidism 02/15/2007   Qualifier: Diagnosis of  By: McDiarmid MD, Jannifer Franklin, BORDERLINE 02/15/2007   Insomnia 03/03/2021   Intra-abdominal adhesions    Irritable bowel syndrome 01/03/2007   Qualifier: Diagnosis of  By: Drucie Ip     LACTOSE INTOLERANCE 03/10/2008    Major depressive disorder, recurrent episode ([place]) 01/03/2007   Qualifier: Diagnosis of  By: Drucie Ip     Medication management 12/14/2016   Managing topiramate for weight loss starting 12/14/16   Memory difficulties 09/47/0962   Monoallelic mutation of BRIP1 gene 01/17/2021   c.2010dup (p.[id number]*)   No impairment of memory 04/29/2021   Obesity (BMI 30.0-34.9) 09/30/2007   Has lost 55 lbs since easter. Goal of <190 by 08/05/12.      Obesity, Class III, BMI 40-49.9 (morbid obesity) ([place]) 09/30/2007        Osteoarthritis of spine 01/03/2007   MR I Lumbar (08/03): GeneralDDD; L2-3 Large  HNP.  Mod pos  hnp - 12/14/2005, smhnpw/irritL4root - 12/14/2005     OSTEOARTHRITIS, HANDS, BILATERAL 06/18/2008   Qualifier: Diagnosis of  By: McDiarmid MD, Kristeen Miss FEMORAL STRESS SYNDROME 01/03/2007   Qualifier: Diagnosis of  By: Drucie Ip     Periodic limb movements of sleep 12/05/2010   Diagnosed on Polysomnography testing Fall 2011.      Pneumonia    walking pneumonia in the 90's   Pre-diabetes 12/25/2008   Qualifier: Diagnosis of  By: McDiarmid MD, Todd     Prolapsed internal hemorrhoids, grade 3, s/p ligation/pexy/hemorrhoidectomy 03/28/2018 03/28/2018   RESTLESS LEGS SYNDROME 09/11/2007   Polysomnography (10/2010, Dr Keturah Barre, interpreter. ) showed periodic limb movements that frequently awoke patient from sleep with arousal index of 4 per hour. Dr Danton Sewer (Sleep Specialist) thought her RLS was the major contributor to patient poor sleep condition, more than any sleep apnea.  He recommended considering opamine agonist (either requip or mirapex) to see if this will help.      Rosacea, acne 10/24/2011   SIGMOID POLYP 01/23/2008   Solitary lung nodule 04/08/2011   Stage 1 mild COPD by GOLD classification ([place]) 01/03/2007   11/28/2017 Chest CT: Mild centrilobular and paraseptal emphysema with mild diffuse bronchial wall thickening     Stress incontinence    Synovial cyst of  lumbar facet joint 02/17/2016   Trigger thumb of left hand 08/16/2018   Urticaria, chronic 05/22/2011   VITAMIN D DEFICIENCY 03/03/2010   Work-related condition 05/17/2018     Past Surgical History: Past Surgical History:  Procedure Laterality Date   APPENDECTOMY     bladder tack  1998   Dr Jeffie Pollock (urology)   BUNIONECTOMY  1992   bilateral feet with pins in great toes   [place]  2010   Bilateral, Dr Herbie Baltimore Sypher   COLONOSCOPY  2004   Dr Verdia Kuba (GI)   CYSTOCELE REPAIR  1998    Dr Gertie Fey (GYN)-   ESOPHAGOGASTRODUODENOSCOPY     EVALUATION UNDER ANESTHESIA WITH HEMORRHOIDECTOMY N/A 03/28/2018   Procedure: ANORECTAL EXAM UNDER ANESTHESIA lateral internal partial sphincterotomy, hemorrhoidal ligation pixie, HEMORRHOIDECTOMY;  Surgeon: Michael Boston, MD;  Location: [facility];  Service: General;  Laterality:  N/A;   EXAM ANORECTAL W/ ULTRASOUND     Dr. Johney Maine 03-28-18    EXPLORATORY LAPAROTOMY     FACIAL LACERATIONS REPAIR     Facial Laceration repair as adolescent    INJECTION HIP INTRA ARTICULAR  2008   Left Hip fluoroscopically-guided corticosteorid injection for painful osteoarthritis with good effect (06/2007)   LAPAROSCOPIC LYSIS OF ADHESIONS  05/19/2021   Procedure: LAPAROSCOPIC LYSIS OF ADHESIONS; BIOPSY ABDOMINAL SKIN LESION;  Surgeon: Lafonda Mosses, MD;  Location: [facility];  Service: Gynecology;;   LAPAROSCOPY FOR ECTOPIC PREGNANCY     LUMBAR LAMINECTOMY/DECOMPRESSION MICRODISCECTOMY N/A 06/21/2016   Procedure: MICRO LUMBAR DECOMPRESSION L4-L5 AND REMOVAL OF SYNOVIAL CYST    1 LEVEL;  Surgeon: Susa Day, MD;  Location: [facility];  Service: Orthopedics;  Laterality: N/A;   MRSA     NM MYOVIEW LTD  2011   Dr Daneen Schick, III (Card)   RECTOCELE REPAIR  1998   Dr Gertie Fey (GYN)   ROBOTIC ASSISTED SALPINGO OOPHERECTOMY N/A 05/19/2021   Procedure: XI ROBOTIC ASSISTED LEFT SALPINGO- OOPHORECTOMY WITH PELVIC WASHINGS;  Surgeon: Lafonda Mosses, MD;  Location: [facility];  Service: Gynecology;  Laterality: N/A;   SPHINCTEROTOMY N/A 03/28/2018   Procedure: ANAL SPHINCTEROTOMY;  Surgeon: Michael Boston, MD;  Location: [facility];  Service: General;  Laterality: N/A;   UPPER GASTROINTESTINAL ENDOSCOPY      Allergies: Allergies  Allergen Reactions   Diclofenac-Misoprostol Shortness Of Breath    elevated blood pressure   Aripiprazole     Doesn't want to take because of possible side effects   Emsam [Selegiline] Other (See Comments)    Burned the skin after using for a year   Estradiol Swelling   Ibuprofen Hives   Naproxen Nausea Only   Neurontin [Gabapentin] Hives and Swelling   Paroxetine Other (See Comments)    Panic attacks   Prednisone Other (See Comments)    Severe depression   Tape Other (See Comments)    Paper tape caused blisters, plastic tape ok    Outpatient Meds: Current Outpatient Medications  Medication Sig Dispense Refill   atorvastatin (LIPITOR) 20 MG tablet Take 1 tablet (20 mg total) by mouth daily. 90 tablet 3   buPROPion ER (WELLBUTRIN SR) 100 MG 12 hr tablet Take 100 mg by mouth 2 (two) times daily.     Calcium Carb-Cholecalciferol (CALCIUM-VITAMIN D) 600-400 MG-UNIT TABS Take 1 tablet by mouth daily.     Carboxymethylcellulose Sodium (THERATEARS) 0.25 % SOLN Place 1 drop into both eyes daily as needed (dry eyes).     citalopram (CELEXA) 20 MG tablet Take 20 mg by mouth at bedtime.     Continuous Blood Gluc Sensor (FREESTYLE LIBRE 14 DAY SENSOR) MISC APPLY EVERY 14 DAYS 2 each PRN   dicyclomine (BENTYL) 20 MG tablet Take 1 tablet (20 mg total) by mouth every 8 (eight) hours as needed for spasms. 90 tablet 0   fexofenadine (ALLEGRA) 180 MG tablet Take 180 mg by mouth daily.     fluticasone (FLONASE) 50 MCG/ACT nasal spray Place 2 sprays into both nostrils daily.     FLUZONE HIGH-DOSE QUADRIVALENT 0.7 ML SUSY      hydrochlorothiazide (HYDRODIURIL) 25 MG tablet TAKE 1 TABLET BY MOUTH EVERY DAY 90  tablet 3   HYDROcodone-acetaminophen (NORCO) 10-325 MG tablet Take 1 tablet by mouth every 6 (six) hours as needed for up to 20 days for moderate pain. 30 tablet 0   hydrOXYzine (VISTARIL) 50 MG capsule Take 150  mg by mouth at bedtime.      KLOR-CON M10 10 MEQ tablet TAKE 4 TABLETS (40 MEQ TOTAL) BY MOUTH AT BEDTIME. 360 tablet 3   levothyroxine (SYNTHROID) 125 MCG tablet TAKE 1 TABLET (125 MCG TOTAL) BY MOUTH EVERY MORNING. 30 MINUTES BEFORE FOOD 90 tablet 3   loperamide (IMODIUM) 2 MG capsule TAKE 1 CAPSULE (2 MG TOTAL) BY MOUTH AS NEEDED FOR DIARRHEA OR LOOSE STOOLS. 30 capsule 0   LORazepam (ATIVAN) 0.5 MG tablet Take 0.5 mg by mouth 3 (three) times daily as needed.     metoprolol tartrate (LOPRESSOR) 50 MG tablet TAKE 1 TABLET BY MOUTH TWICE A DAY 180 tablet 3   morphine (MS CONTIN) 15 MG 12 hr tablet Take 1 tablet (15 mg total) by mouth every 12 (twelve) hours. 50 tablet 0   Multiple Vitamins-Minerals (MULTIVITAMIN WITH MINERALS) tablet Take 1 tablet by mouth daily.     omeprazole (PRILOSEC) 40 MG capsule TAKE 1 CAPSULE BY MOUTH EVERY DAY 90 capsule 3   polyethylene glycol powder (GLYCOLAX/MIRALAX) 17 GM/SCOOP powder Take 17 g by mouth as needed for mild constipation.     prazosin (MINIPRESS) 2 MG capsule Take 2 mg by mouth at bedtime.     prochlorperazine (COMPAZINE) 10 MG tablet Take 1 tablet (10 mg total) by mouth 2 (two) times daily as needed for nausea or vomiting. TAKE ONE TABLET BY MOUTH TWICE A DAY AS NEEDED FOR NAUSEA OR VOMITING 28 tablet 1   ramipril (ALTACE) 10 MG capsule TAKE 1 CAPSULE BY MOUTH EVERY DAY 90 capsule 3   simethicone (MYLICON) 80 MG chewable tablet Chew 160 mg by mouth daily as needed (gas).     solifenacin (VESICARE) 5 MG tablet TAKE 1 TABLET (5 MG TOTAL) BY MOUTH DAILY. 90 tablet 3   tiZANidine (ZANAFLEX) 2 MG tablet TAKE 1 TABLET BY MOUTH EVERYDAY AT BEDTIME 90 tablet 1   traZODone (DESYREL) 50 MG tablet Take 150 mg by mouth at bedtime.     vitamin B-12  (CYANOCOBALAMIN) 1000 MCG tablet Take 1,000 mcg by mouth daily.     naloxone (NARCAN) nasal spray 4 mg/0.1 mL PLEASE SEE ATTACHED FOR DETAILED DIRECTIONS     PFIZER COVID-19 VAC BIVALENT injection      Probiotic Product (PROBIOTIC DAILY PO) Take 1 capsule by mouth daily.     senna-docusate (SENOKOT-S) 8.6-50 MG tablet Take 2 tablets by mouth at bedtime. For AFTER surgery, do not take if having diarrhea 30 tablet 0   Current Facility-Administered Medications  Medication Dose Route Frequency Provider Last Rate Last Admin   0.9 %  sodium chloride infusion  500 mL Intravenous Once Danis, Kirke Corin, MD          ___________________________________________________________________ Objective   Exam:  BP (!) 117/57   Pulse 77   Temp 97.8 F (36.6 C)   Ht '5\' 4"'  (1.626 m)   Wt 257 lb (116.6 kg)   SpO2 95%   BMI 44.11 kg/m   CV: RRR without murmur, S1/S2 Resp: clear to auscultation bilaterally, normal RR and effort noted GI: soft, no tenderness, with active bowel sounds.   Assessment: Encounter Diagnosis  Name Primary?   Personal history of colonic polyps Yes     Plan: Colonoscopy  The benefits and risks of the planned procedure were described in detail with the patient or (when appropriate) their health care proxy.  Risks were outlined as including, but not limited to, bleeding, infection, perforation, adverse medication reaction leading to  cardiac or pulmonary decompensation, pancreatitis (if ERCP).  The limitation of incomplete mucosal visualization was also discussed.  No guarantees or warranties were given.    The patient is appropriate for an endoscopic procedure in the ambulatory setting.   - Wilfrid Lund, MD

## 2022-06-08 NOTE — Patient Instructions (Signed)
Discharge instructions given. Handout on polyps. Resume previous medications. YOU HAD AN ENDOSCOPIC PROCEDURE TODAY AT [facility]:   Refer to the procedure report that was given to you for any specific questions about what was found during the examination.  If the procedure report does not answer your questions, please call your gastroenterologist to clarify.  If you requested that your care partner not be given the details of your procedure findings, then the procedure report has been included in a sealed envelope for you to review at your convenience later.  YOU SHOULD EXPECT: Some feelings of bloating in the abdomen. Passage of more gas than usual.  Walking can help get rid of the air that was put into your GI tract during the procedure and reduce the bloating. If you had a lower endoscopy (such as a colonoscopy or flexible sigmoidoscopy) you may notice spotting of blood in your stool or on the toilet paper. If you underwent a bowel prep for your procedure, you may not have a normal bowel movement for a few days.  Please Note:  You might notice some irritation and congestion in your nose or some drainage.  This is from the oxygen used during your procedure.  There is no need for concern and it should clear up in a day or so.  SYMPTOMS TO REPORT IMMEDIATELY:  Following lower endoscopy (colonoscopy or flexible sigmoidoscopy):  Excessive amounts of blood in the stool  Significant tenderness or worsening of abdominal pains  Swelling of the abdomen that is new, acute  Fever of 100F or higher   For urgent or emergent issues, a gastroenterologist can be reached at any hour by calling [phone]. Do not use MyChart messaging for urgent concerns.    DIET:  We do recommend a small meal at first, but then you may proceed to your regular diet.  Drink plenty of fluids but you should avoid alcoholic beverages for 24 hours.  ACTIVITY:  You should plan to take it easy for the rest  of today and you should NOT DRIVE or use heavy machinery until tomorrow (because of the sedation medicines used during the test).    FOLLOW UP: Our staff will call the number listed on your records the next business day following your procedure.  We will call around 7:15- 8:00 am to check on you and address any questions or concerns that you may have regarding the information given to you following your procedure. If we do not reach you, we will leave a message.  If you develop any symptoms (ie: fever, flu-like symptoms, shortness of breath, cough etc.) before then, please call [phone].  If you test positive for Covid 19 in the 2 weeks post procedure, please call and report this information to [place].    If any biopsies were taken you will be contacted by phone or by letter within the next 1-3 weeks.  Please call us at [phone] if you have not heard about the biopsies in 3 weeks.    SIGNATURES/CONFIDENTIALITY: You and/or your care partner have signed paperwork which will be entered into your electronic medical record.  These signatures attest to the fact that that the information above on your After Visit Summary has been reviewed and is understood.  Full responsibility of the confidentiality of this discharge information lies with you and/or your care-partner.

## 2022-06-08 NOTE — Op Note (Signed)
[place] Patient Name: [name] Procedure Date: 06/08/2022 1:25 PM MRN: [id number] Endoscopist: Mallie Mussel L. Loletha Carrow , MD Age: 71 Referring MD:  Date of Birth: 04-16-1951 Gender: Female Account #: [id number] Procedure:                Colonoscopy Indications:              Surveillance: Personal history of adenomatous                            polyps on last colonoscopy 3 years ago                           TA < 57m x 6 in Aug 2020 Medicines:                Monitored Anesthesia Care Procedure:                Pre-Anesthesia Assessment:                           - Prior to the procedure, a History and Physical                            was performed, and patient medications and                            allergies were reviewed. The patient's tolerance of                            previous anesthesia was also reviewed. The risks                            and benefits of the procedure and the sedation                            options and risks were discussed with the patient.                            All questions were answered, and informed consent                            was obtained. Prior Anticoagulants: The patient has                            taken no previous anticoagulant or antiplatelet                            agents. ASA Grade Assessment: III - A patient with                            severe systemic disease. After reviewing the risks                            and benefits, the patient was deemed in  satisfactory condition to undergo the procedure.                           After obtaining informed consent, the colonoscope                            was passed under direct vision. Throughout the                            procedure, the patient's blood pressure, pulse, and                            oxygen saturations were monitored continuously. The                            CF [id number] #[id number] was introduced through the anus                             and advanced to the the cecum, identified by                            appendiceal orifice and ileocecal valve. The                            colonoscopy was performed without difficulty. The                            patient tolerated the procedure well. The quality                            of the bowel preparation was excellent. The                            ileocecal valve, appendiceal orifice, and rectum                            were photographed. Scope In: 1:36:44 PM Scope Out: 1:49:38 PM Scope Withdrawal Time: 0 hours 9 minutes 55 seconds  Total Procedure Duration: 0 hours 12 minutes 54 seconds  Findings:                 The perianal and digital rectal examinations were                            normal.                           Repeat examination of right colon under NBI                            performed.                           A diminutive polyp was found in the sigmoid colon.  The polyp was sessile. The polyp was removed with a                            cold snare. Resection and retrieval were complete.                           The exam was otherwise without abnormality on                            direct and retroflexion views. Complications:            No immediate complications. Estimated Blood Loss:     Estimated blood loss was minimal. Impression:               - One diminutive polyp in the sigmoid colon,                            removed with a cold snare. Resected and retrieved.                           - The examination was otherwise normal on direct                            and retroflexion views. Recommendation:           - Patient has a contact number available for                            emergencies. The signs and symptoms of potential                            delayed complications were discussed with the                            patient. Return to normal activities tomorrow.                             Written discharge instructions were provided to the                            patient.                           - Resume previous diet.                           - Continue present medications.                           - Await pathology results.                           - Repeat colonoscopy in 5 years for surveillance if                            polyp is adenomatous. If hyperplastic, no recall  recommended. Londen Bok L. Loletha Carrow, MD 06/08/2022 1:55:30 PM This report has been signed electronically.

## 2022-06-08 NOTE — Progress Notes (Signed)
Pt's states no medical or surgical changes since previsit or office visit. 

## 2022-06-09 ENCOUNTER — Telehealth: Payer: Self-pay | Admitting: *Deleted

## 2022-06-09 NOTE — Telephone Encounter (Signed)
Post procedure follow up call placed, no answer and left VM.  

## 2022-06-14 ENCOUNTER — Encounter: Payer: Self-pay | Admitting: Gastroenterology

## 2022-07-02 ENCOUNTER — Encounter: Payer: Self-pay | Admitting: Family Medicine

## 2022-07-02 DIAGNOSIS — G894 Chronic pain syndrome: Secondary | ICD-10-CM

## 2022-07-02 DIAGNOSIS — G2581 Restless legs syndrome: Secondary | ICD-10-CM

## 2022-07-02 DIAGNOSIS — M160 Bilateral primary osteoarthritis of hip: Secondary | ICD-10-CM

## 2022-07-02 DIAGNOSIS — M48061 Spinal stenosis, lumbar region without neurogenic claudication: Secondary | ICD-10-CM

## 2022-07-02 DIAGNOSIS — M47896 Other spondylosis, lumbar region: Secondary | ICD-10-CM

## 2022-07-04 MED ORDER — HYDROCODONE-ACETAMINOPHEN 10-325 MG PO TABS: 1.0000 | ORAL_TABLET | Freq: Four times a day (QID) | ORAL | 0 refills | Status: DC | PRN

## 2022-07-04 MED ORDER — MORPHINE SULFATE ER 15 MG PO TBCR: 15.0000 mg | EXTENDED_RELEASE_TABLET | Freq: Two times a day (BID) | ORAL | 0 refills | Status: DC

## 2022-07-05 ENCOUNTER — Other Ambulatory Visit: Payer: Self-pay | Admitting: Family Medicine

## 2022-07-05 DIAGNOSIS — K529 Noninfective gastroenteritis and colitis, unspecified: Secondary | ICD-10-CM

## 2022-07-05 DIAGNOSIS — K580 Irritable bowel syndrome with diarrhea: Secondary | ICD-10-CM

## 2022-07-20 ENCOUNTER — Encounter: Payer: Self-pay | Admitting: Family Medicine

## 2022-07-20 LAB — HM DIABETES EYE EXAM

## 2022-08-01 ENCOUNTER — Encounter: Payer: Self-pay | Admitting: Family Medicine

## 2022-08-01 DIAGNOSIS — M47896 Other spondylosis, lumbar region: Secondary | ICD-10-CM

## 2022-08-01 DIAGNOSIS — M48061 Spinal stenosis, lumbar region without neurogenic claudication: Secondary | ICD-10-CM

## 2022-08-01 DIAGNOSIS — M160 Bilateral primary osteoarthritis of hip: Secondary | ICD-10-CM

## 2022-08-01 DIAGNOSIS — G2581 Restless legs syndrome: Secondary | ICD-10-CM

## 2022-08-01 DIAGNOSIS — G894 Chronic pain syndrome: Secondary | ICD-10-CM

## 2022-08-02 ENCOUNTER — Other Ambulatory Visit: Payer: Self-pay | Admitting: Family Medicine

## 2022-08-02 DIAGNOSIS — K529 Noninfective gastroenteritis and colitis, unspecified: Secondary | ICD-10-CM

## 2022-08-02 DIAGNOSIS — K580 Irritable bowel syndrome with diarrhea: Secondary | ICD-10-CM

## 2022-08-02 MED ORDER — MORPHINE SULFATE ER 15 MG PO TBCR: 15.0000 mg | EXTENDED_RELEASE_TABLET | Freq: Two times a day (BID) | ORAL | 0 refills | Status: DC

## 2022-08-02 MED ORDER — HYDROCODONE-ACETAMINOPHEN 10-325 MG PO TABS: 1.0000 | ORAL_TABLET | Freq: Four times a day (QID) | ORAL | 0 refills | Status: DC | PRN

## 2022-08-05 ENCOUNTER — Other Ambulatory Visit: Payer: Self-pay | Admitting: Family Medicine

## 2022-08-05 DIAGNOSIS — K529 Noninfective gastroenteritis and colitis, unspecified: Secondary | ICD-10-CM

## 2022-08-05 DIAGNOSIS — K580 Irritable bowel syndrome with diarrhea: Secondary | ICD-10-CM

## 2022-08-06 DIAGNOSIS — K1321 Leukoplakia of oral mucosa, including tongue: Secondary | ICD-10-CM

## 2022-08-06 HISTORY — DX: Leukoplakia of oral mucosa, including tongue: K13.21

## 2022-08-08 ENCOUNTER — Ambulatory Visit: Payer: PPO | Admitting: Gastroenterology

## 2022-08-10 DIAGNOSIS — F4312 Post-traumatic stress disorder, chronic: Secondary | ICD-10-CM | POA: Diagnosis not present

## 2022-08-10 DIAGNOSIS — F3181 Bipolar II disorder: Secondary | ICD-10-CM | POA: Diagnosis not present

## 2022-08-12 ENCOUNTER — Other Ambulatory Visit: Payer: Self-pay | Admitting: Family Medicine

## 2022-08-12 DIAGNOSIS — I10 Essential (primary) hypertension: Secondary | ICD-10-CM

## 2022-08-24 ENCOUNTER — Ambulatory Visit (INDEPENDENT_AMBULATORY_CARE_PROVIDER_SITE_OTHER): Payer: PPO | Admitting: Family Medicine

## 2022-08-24 ENCOUNTER — Encounter: Payer: Self-pay | Admitting: Family Medicine

## 2022-08-24 VITALS — BP 133/64 | HR 77 | Ht 64.0 in | Wt 240.1 lb

## 2022-08-24 DIAGNOSIS — M160 Bilateral primary osteoarthritis of hip: Secondary | ICD-10-CM

## 2022-08-24 DIAGNOSIS — K929 Disease of digestive system, unspecified: Secondary | ICD-10-CM | POA: Diagnosis not present

## 2022-08-24 DIAGNOSIS — I152 Hypertension secondary to endocrine disorders: Secondary | ICD-10-CM | POA: Diagnosis not present

## 2022-08-24 DIAGNOSIS — G2581 Restless legs syndrome: Secondary | ICD-10-CM | POA: Diagnosis not present

## 2022-08-24 DIAGNOSIS — G894 Chronic pain syndrome: Secondary | ICD-10-CM | POA: Diagnosis not present

## 2022-08-24 DIAGNOSIS — M48061 Spinal stenosis, lumbar region without neurogenic claudication: Secondary | ICD-10-CM | POA: Diagnosis not present

## 2022-08-24 DIAGNOSIS — E1159 Type 2 diabetes mellitus with other circulatory complications: Secondary | ICD-10-CM

## 2022-08-24 DIAGNOSIS — M47896 Other spondylosis, lumbar region: Secondary | ICD-10-CM | POA: Diagnosis not present

## 2022-08-24 DIAGNOSIS — E119 Type 2 diabetes mellitus without complications: Secondary | ICD-10-CM | POA: Diagnosis not present

## 2022-08-24 LAB — POCT GLYCOSYLATED HEMOGLOBIN (HGB A1C): HbA1c, POC (controlled diabetic range): 6.2 % (ref 0.0–7.0)

## 2022-08-24 NOTE — Patient Instructions (Signed)
Blood pressure looks good.  A1c is under great control- You remain in remission.   Lets start the process of weaning you down from the MS Contin. Take one MS contin at bedtime In daytime take half hydrocodone 10 mg tablet twice during the day.  Each time we refill the hydrocodone, we will decrease the total number of tablets by one.    Decrease your Bentyl to one a day.

## 2022-08-25 ENCOUNTER — Encounter: Payer: Self-pay | Admitting: Family Medicine

## 2022-08-25 MED ORDER — MORPHINE SULFATE ER 15 MG PO TBCR: 15.0000 mg | EXTENDED_RELEASE_TABLET | Freq: Every day | ORAL | 0 refills | Status: DC

## 2022-08-25 MED ORDER — HYDROCODONE-ACETAMINOPHEN 10-325 MG PO TABS: 1.0000 | ORAL_TABLET | Freq: Four times a day (QID) | ORAL | 0 refills | Status: DC | PRN

## 2022-08-25 NOTE — Assessment & Plan Note (Signed)
Established problem We discussed beginning a reduction in her monthly supply of MS Contin 15 mg twice a day Will convert her daytime MS contin to 10 mg hydrocodone-APAP 10/325 tablet. MS Contin Prescription on 08/31/22 will be for 30 tablets month instead of 60. Ms Hochberg will take a half a HC-APAP 10/325 tab twice during the day as a slightly reduced dose replacement of her daytime MS Contin 15 mg.  She will continue to receive her 30 monthly talbets of HC-APAP 10/325 per month for breakthrough pain and RLS breakthru. She will receive a prescription on 10/26 for HC-APAP 10/325 tab, 60 tab for a month suppl  Each month the number of HC-APAP tablets prescribed will drop by two tablets with initial goal of reduction to MS Contin 15 once daily with 30 HC-APAP tablets for breakthru a month.  The [facility] Morphine Daily Dose of 40 mg will decrease to 35 mg with next presciptions of MS Contin and HC-APAP 08/31/22.

## 2022-08-25 NOTE — Assessment & Plan Note (Signed)
Established problem. Adequate blood pressure control.  No evidence of new end organ damage.  Tolerating medication without significant adverse effects.  Plan to continue current blood pressure medication regiment.   

## 2022-08-25 NOTE — Assessment & Plan Note (Signed)
Established problem that has improved and has meet goal of free from functional GI D/O symptoms.  She has not used Compazine in over a month.  She has ~8 tablets left.  She plans to use the Compazine only for rescue therapy [name] is to decrease her Bentyl use to one tablet a day instead of her current two tab daily.

## 2022-08-25 NOTE — Assessment & Plan Note (Addendum)
Wt Readings from Last 3 Encounters:  08/24/22 240 lb 2 oz (108.9 kg)  06/08/22 257 lb (116.6 kg)  05/18/22 257 lb (116.6 kg)  Established problem that has improved with 17 pounds loss with dietary changes. She is not interested in GLP-1 because of risk of upseting her functionally impaired stomach Continue current dietary changes

## 2022-08-25 NOTE — Progress Notes (Signed)
[name] is alone Sources of clinical information for visit is/are patient. Nursing assessment for this office visit was reviewed with the patient for accuracy and revision.     Previous Report(s) Reviewed: none     08/24/2022    1:40 PM  Depression screen PHQ 2/9  Decreased Interest 2  Down, Depressed, Hopeless 2  PHQ - 2 Score 4  Altered sleeping 2  Tired, decreased energy 3  Change in appetite 1  Feeling bad or failure about yourself  1  Trouble concentrating 2  Moving slowly or fidgety/restless 0  Suicidal thoughts 0  PHQ-9 Score 13  Difficult doing work/chores Very difficult   [place] Office Visit from 08/24/2022 in [place] Office Visit from 05/18/2022 in [place] Office Visit from 04/19/2022 in [place]  Thoughts that you would be better off dead, or of hurting yourself in some way Not at all Not at all Not at all  PHQ-9 Total Score '13 11 17          '$ 08/24/2022    1:41 PM 05/18/2022   11:01 AM 04/19/2022   11:25 AM 03/02/2022   11:01 AM 01/11/2022    4:17 PM  [place] in the past year? 0 0 0 0 0  Number falls in past yr: 0 0 0 0 0  Injury with Fall? 0 0 0 0 0  Risk for fall due to :     Impaired balance/gait  Follow up     Falls prevention discussed       08/24/2022    1:40 PM 05/18/2022   11:00 AM 04/19/2022   11:25 AM  PHQ9 SCORE ONLY  PHQ-9 Total Score '13 11 17    '$ Adult vaccines due  Topic Date Due   TETANUS/TDAP  10/21/2023    Health Maintenance Due  Topic Date Due   FOOT EXAM  09/16/2021      History/P.E. limitations: none  Adult vaccines due  Topic Date Due   TETANUS/TDAP  10/21/2023    Diabetes Health Maintenance Due  Topic Date Due   FOOT EXAM  09/16/2021   HEMOGLOBIN A1C  02/23/2023   OPHTHALMOLOGY EXAM  07/21/2023    Health Maintenance Due  Topic Date Due   FOOT EXAM  09/16/2021     Chief Complaint  Patient presents with    Follow-up    a1c    Diabetic Foot Exam - Simple   Simple Foot Form  08/24/2022  3:40 AM  Visual Inspection No deformities, no ulcerations, no other skin breakdown bilaterally: Yes Sensation Testing Intact to touch and monofilament testing bilaterally: Yes Pulse Check Posterior Tibialis and Dorsalis pulse intact bilaterally: Yes Comments     --------------------------------------------------------------------------------------------------------------------------------------------- Visit Problem List with A/P  Type 2 diabetes mellitus without complications ([place]) Lab Results  Component Value Date   HGBA1C 6.2 08/24/2022  Established problem Well Controlled and is at goal of A1c < 7.0%. No signs of complications, medication side effects, or red flags. Continue lifestyle managemnt. Wt Readings from Last 3 Encounters:  08/24/22 240 lb 2 oz (108.9 kg)  06/08/22 257 lb (116.6 kg)  05/18/22 257 lb (116.6 kg)   Significant weight loss. Does not think she her stomach would tolerate an GLP-P  Hypertension associated with diabetes ([place]) Established problem. Adequate blood pressure control.  No evidence of new end organ damage.  Tolerating medication without significant adverse effects.  Plan to continue  current blood pressure medication regiment.    Chronic pain syndrome Established problem We discussed beginning a reduction in her monthly supply of MS Contin 15 mg twice a day Will convert her daytime MS contin to 10 mg hydrocodone-APAP 10/325 tablet. MS Contin Prescription on 08/31/22 will be for 30 tablets month instead of 60. Ms Brocksmith will take a half a HC-APAP 10/325 tab twice during the day as a slightly reduced dose replacement of her daytime MS Contin 15 mg.  She will continue to receive her 30 monthly talbets of HC-APAP 10/325 per month for breakthrough pain and RLS breakthru. She will receive a prescription on 10/26 for HC-APAP 10/325 tab, 60 tab for a month suppl  Each  month the number of HC-APAP tablets prescribed will drop by two tablets with initial goal of reduction to MS Contin 15 once daily with 30 HC-APAP tablets for breakthru a month.  The [facility] Morphine Daily Dose of 40 mg will decrease to 35 mg with next presciptions of MS Contin and HC-APAP 08/31/22.   Functional gastrointestinal disorder Established problem that has improved and has meet goal of free from functional GI D/O symptoms.  She has not used Compazine in over a month.  She has ~8 tablets left.  She plans to use the Compazine only for rescue therapy Ms Wisdom is to decrease her Bentyl use to one tablet a day instead of her current two tab daily.    Obesity, Class III, BMI 40-49.9 (morbid obesity) (HCC) Wt Readings from Last 3 Encounters:  08/24/22 240 lb 2 oz (108.9 kg)  06/08/22 257 lb (116.6 kg)  05/18/22 257 lb (116.6 kg)  Established problem that has improved with 17 pounds loss with dietary changes. She is not interested in GLP-1 because of risk of upseting her functionally impaired stomach Continue current dietary changes

## 2022-08-25 NOTE — Assessment & Plan Note (Addendum)
Lab Results  Component Value Date   HGBA1C 6.2 08/24/2022  Established problem Well Controlled and is at goal of A1c < 7.0%. No signs of complications, medication side effects, or red flags. Continue lifestyle managemnt. Wt Readings from Last 3 Encounters:  08/24/22 240 lb 2 oz (108.9 kg)  06/08/22 257 lb (116.6 kg)  05/18/22 257 lb (116.6 kg)   Significant weight loss. Does not think she her stomach would tolerate an GLP-P

## 2022-08-31 ENCOUNTER — Encounter: Payer: Self-pay | Admitting: Family Medicine

## 2022-08-31 ENCOUNTER — Ambulatory Visit: Payer: PPO | Admitting: Gastroenterology

## 2022-08-31 DIAGNOSIS — M160 Bilateral primary osteoarthritis of hip: Secondary | ICD-10-CM

## 2022-08-31 DIAGNOSIS — M47896 Other spondylosis, lumbar region: Secondary | ICD-10-CM

## 2022-08-31 DIAGNOSIS — M48061 Spinal stenosis, lumbar region without neurogenic claudication: Secondary | ICD-10-CM

## 2022-08-31 DIAGNOSIS — G2581 Restless legs syndrome: Secondary | ICD-10-CM

## 2022-08-31 DIAGNOSIS — G894 Chronic pain syndrome: Secondary | ICD-10-CM

## 2022-09-01 MED ORDER — HYDROCODONE-ACETAMINOPHEN 10-325 MG PO TABS: 1.0000 | ORAL_TABLET | Freq: Four times a day (QID) | ORAL | 0 refills | Status: DC | PRN

## 2022-09-01 NOTE — Telephone Encounter (Signed)
Dr. McDiarmid  I have cancelled out hydrocodone prescription at [facility].   Please resend to [facility] on [address].   I have pended this for you.

## 2022-09-06 ENCOUNTER — Telehealth: Payer: Self-pay | Admitting: Acute Care

## 2022-09-06 ENCOUNTER — Other Ambulatory Visit: Payer: Self-pay

## 2022-09-06 DIAGNOSIS — Z122 Encounter for screening for malignant neoplasm of respiratory organs: Secondary | ICD-10-CM

## 2022-09-06 DIAGNOSIS — Z87891 Personal history of nicotine dependence: Secondary | ICD-10-CM

## 2022-09-06 NOTE — Telephone Encounter (Signed)
Patient has been scheduled for sdmv and ldct

## 2022-09-06 NOTE — Telephone Encounter (Signed)
Pt called the office stating that she was told that she was overdue for a lung cancer screening visit. Please advise.

## 2022-09-12 ENCOUNTER — Telehealth: Payer: Self-pay

## 2022-09-12 NOTE — Patient Outreach (Signed)
  Care Coordination   Initial Visit Note   09/12/2022 Name: Nyesha Cliff MRN: [id number] DOB: 24-Nov-1950  Irianna Gilday Smethers is a 71 y.o. year old female who sees McDiarmid, Blane Ohara, MD for primary care. I spoke with  [name] by phone today.  What matters to the patients health and wellness today?  Patient noted to have multiple medical issues including bipolar depression with multiple suicide attempts, dental issues, chronic pain and decreased mobility.     Goals Addressed               This Visit's Progress     "I have multiple medical issues, chronic pain, bipolar depression, poor mobility" (pt-stated)        Care Coordination Interventions: Reviewed medications with patient and discussed patient being weaned off some of her pain medications Reviewed scheduled/upcoming provider appointments including ENT appointment on 09/14/22. Discussed plans with patient for ongoing care management follow up and provided patient with direct contact information for care management team Assessed social determinant of health barriers         SDOH assessments and interventions completed:  Yes  SDOH Interventions Today    Flowsheet Row Most Recent Value  SDOH Interventions   Transportation Interventions Intervention Not Indicated  Utilities Interventions Intervention Not Indicated        Care Coordination Interventions Activated:  Yes  Care Coordination Interventions:  Yes, provided   Follow up plan: Referral made to [place], Care Coordinator    Encounter Outcome:  Pt. Visit Completed

## 2022-09-19 ENCOUNTER — Other Ambulatory Visit: Payer: Self-pay | Admitting: Family Medicine

## 2022-09-19 DIAGNOSIS — M797 Fibromyalgia: Secondary | ICD-10-CM

## 2022-09-20 DIAGNOSIS — L859 Epidermal thickening, unspecified: Secondary | ICD-10-CM | POA: Diagnosis not present

## 2022-09-20 DIAGNOSIS — R519 Headache, unspecified: Secondary | ICD-10-CM | POA: Insufficient documentation

## 2022-09-20 DIAGNOSIS — K1321 Leukoplakia of oral mucosa, including tongue: Secondary | ICD-10-CM

## 2022-09-20 DIAGNOSIS — K1329 Other disturbances of oral epithelium, including tongue: Secondary | ICD-10-CM | POA: Diagnosis not present

## 2022-09-20 DIAGNOSIS — Z87891 Personal history of nicotine dependence: Secondary | ICD-10-CM | POA: Diagnosis not present

## 2022-09-20 HISTORY — DX: Headache, unspecified: R51.9

## 2022-09-20 HISTORY — DX: Leukoplakia of oral mucosa, including tongue: K13.21

## 2022-09-26 DIAGNOSIS — R519 Headache, unspecified: Secondary | ICD-10-CM | POA: Diagnosis not present

## 2022-09-27 ENCOUNTER — Ambulatory Visit: Payer: Self-pay

## 2022-09-27 DIAGNOSIS — G4700 Insomnia, unspecified: Secondary | ICD-10-CM | POA: Diagnosis not present

## 2022-09-27 DIAGNOSIS — F4310 Post-traumatic stress disorder, unspecified: Secondary | ICD-10-CM | POA: Diagnosis not present

## 2022-09-27 DIAGNOSIS — F411 Generalized anxiety disorder: Secondary | ICD-10-CM | POA: Diagnosis not present

## 2022-09-27 NOTE — Patient Outreach (Signed)
  Care Coordination   Initial Visit Note   09/27/2022 Name: [name] MRN: [id number] DOB: 09-22-1951  [name] is a 71 y.o. year old female who sees McDiarmid, Blane Ohara, MD for primary care. I spoke with  [name] by phone today.  What matters to the patients health and wellness today?  I am doing ok. I have some issues with my health that I see my PCP.    Goals Addressed             This Visit's Progress    COMPLETED: Care Coorination Activities - no follow up needed        Care Coordination Interventions: Active listening / Reflection utilized  Emotional Support Provided Problem [place] strategies reviewed Discussed/.Educated Care Coordination Program 2.   Discussed/.Educated Annual Wellness Visit 3.   Discussed/.Educated Social Determinates of Health 4.   Please inform PCP if services needed in the future         SDOH assessments and interventions completed:  Yes  SDOH Interventions Today    Flowsheet Row Most Recent Value  SDOH Interventions   Food Insecurity Interventions Intervention Not Indicated  Transportation Interventions Intervention Not Indicated        Care Coordination Interventions:  Yes, provided   Follow up plan: No further intervention required.   Encounter Outcome:  Pt. Visit Completed   Lazaro Arms RN, BSN, [facility]   Phone: [phone]

## 2022-09-27 NOTE — Patient Instructions (Signed)
Visit Information  Thank you for taking time to visit with me today. Please don't hesitate to contact me if I can be of assistance to you.   Following are the goals we discussed today:   Goals Addressed             This Visit's Progress    COMPLETED: Care Coorination Activities - no follow up needed        Care Coordination Interventions: Active listening / Reflection utilized  Emotional Support Provided Problem [place] strategies reviewed Discussed/.Educated Care Coordination Program 2.   Discussed/.Educated Annual Wellness Visit 3.   Discussed/.Educated Social Determinates of Health 4.   Please inform PCP if services needed in the future          If you are experiencing a Mental Health or [place] or need someone to talk to, please call 1-800-273-TALK (toll free, 24 hour hotline)  Patient verbalizes understanding of instructions and care plan provided today and agrees to view in [place]. Active MyChart status and patient understanding of how to access instructions and care plan via MyChart confirmed with patient.     Lazaro Arms RN, BSN, [facility]   Phone: [phone]

## 2022-10-02 ENCOUNTER — Encounter: Payer: Self-pay | Admitting: Family Medicine

## 2022-10-02 ENCOUNTER — Other Ambulatory Visit: Payer: Self-pay

## 2022-10-02 DIAGNOSIS — M47896 Other spondylosis, lumbar region: Secondary | ICD-10-CM

## 2022-10-02 DIAGNOSIS — G894 Chronic pain syndrome: Secondary | ICD-10-CM

## 2022-10-02 DIAGNOSIS — G2581 Restless legs syndrome: Secondary | ICD-10-CM

## 2022-10-02 DIAGNOSIS — M48061 Spinal stenosis, lumbar region without neurogenic claudication: Secondary | ICD-10-CM

## 2022-10-02 DIAGNOSIS — M160 Bilateral primary osteoarthritis of hip: Secondary | ICD-10-CM

## 2022-10-03 MED ORDER — HYDROCODONE-ACETAMINOPHEN 10-325 MG PO TABS: 1.0000 | ORAL_TABLET | Freq: Four times a day (QID) | ORAL | 0 refills | Status: DC | PRN

## 2022-10-03 MED ORDER — MORPHINE SULFATE ER 15 MG PO TBCR: 15.0000 mg | EXTENDED_RELEASE_TABLET | Freq: Every day | ORAL | 0 refills | Status: DC

## 2022-10-17 ENCOUNTER — Encounter: Payer: Self-pay | Admitting: Acute Care

## 2022-10-17 ENCOUNTER — Ambulatory Visit (INDEPENDENT_AMBULATORY_CARE_PROVIDER_SITE_OTHER): Payer: PPO | Admitting: Acute Care

## 2022-10-17 DIAGNOSIS — Z87891 Personal history of nicotine dependence: Secondary | ICD-10-CM | POA: Diagnosis not present

## 2022-10-17 NOTE — Patient Instructions (Signed)
Thank you for participating in the [place] Lung Cancer Screening Program. It was our pleasure to meet you today. We will call you with the results of your scan within the next few days. Your scan will be assigned a Lung RADS category score by the physicians reading the scans.  This Lung RADS score determines follow up scanning.  See below for description of categories, and follow up screening recommendations. We will be in touch to schedule your follow up screening annually or based on recommendations of our providers. We will fax a copy of your scan results to your Primary Care Physician, or the physician who referred you to the program, to ensure they have the results. Please call the office if you have any questions or concerns regarding your scanning experience or results.  Our office number is [phone]. Please speak with Denise Phelps, RN. , or  Denise Buckner RN, They are  our Lung Cancer Screening RN.'s If They are unavailable when you call, Please leave a message on the voice mail. We will return your call at our earliest convenience.This voice mail is monitored several times a day.  Remember, if your scan is normal, we will scan you annually as long as you continue to meet the criteria for the program. (Age 55-77, Current smoker or smoker who has quit within the last 15 years). If you are a smoker, remember, quitting is the single most powerful action that you can take to decrease your risk of lung cancer and other pulmonary, breathing related problems. We know quitting is hard, and we are here to help.  Please let us know if there is anything we can do to help you meet your goal of quitting. If you are a former smoker, congratulations. We are proud of you! Remain smoke free! Remember you can refer friends or family members through the number above.  We will screen them to make sure they meet criteria for the program. Thank you for helping us take better care of you by  participating in Lung Screening.  You can receive free nicotine replacement therapy ( patches, gum or mints) by calling 1-800-QUIT NOW. Please call so we can get you on the path to becoming  a non-smoker. I know it is hard, but you can do this!  Lung RADS Categories:  Lung RADS 1: no nodules or definitely non-concerning nodules.  Recommendation is for a repeat annual scan in 12 months.  Lung RADS 2:  nodules that are non-concerning in appearance and behavior with a very low likelihood of becoming an active cancer. Recommendation is for a repeat annual scan in 12 months.  Lung RADS 3: nodules that are probably non-concerning , includes nodules with a low likelihood of becoming an active cancer.  Recommendation is for a 6-month repeat screening scan. Often noted after an upper respiratory illness. We will be in touch to make sure you have no questions, and to schedule your 6-month scan.  Lung RADS 4 A: nodules with concerning findings, recommendation is most often for a follow up scan in 3 months or additional testing based on our provider's assessment of the scan. We will be in touch to make sure you have no questions and to schedule the recommended 3 month follow up scan.  Lung RADS 4 B:  indicates findings that are concerning. We will be in touch with you to schedule additional diagnostic testing based on our provider's  assessment of the scan.  Other options for assistance in smoking cessation (   As covered by your insurance benefits)  Hypnosis for smoking cessation  [employer] [phone]  Acupuncture for smoking cessation  [facility] [phone]   

## 2022-10-17 NOTE — Progress Notes (Signed)
Virtual Visit via Telephone Note  I connected with [name] on 10/17/22 at  2:30 PM EST by telephone and verified that I am speaking with the correct person using two identifiers.  Location: Patient:  at home Provider:  [place], [place], [place], [address]    I discussed the limitations, risks, security and privacy concerns of performing an evaluation and management service by telephone and the availability of in person appointments. I also discussed with the patient that there may be a patient responsible charge related to this service. The patient expressed understanding and agreed to proceed.    Shared Decision Making Visit Lung Cancer Screening Program [phone])   Eligibility: Age 71 y.o. Pack Years Smoking History Calculation 66 pack year smoking history (# packs/per year x # years smoked) Recent History of coughing up blood  no Unexplained weight loss? no ( >Than 15 pounds within the last 6 months ) Prior History Lung / other cancer no (Diagnosis within the last 5 years already requiring surveillance chest CT Scans). Smoking Status Former Smoker Former Smokers: Years since quit: 14 years  Quit Date: 2009  Visit Components: Discussion included one or more decision making aids. yes Discussion included risk/benefits of screening. yes Discussion included potential follow up diagnostic testing for abnormal scans. yes Discussion included meaning and risk of over diagnosis. yes Discussion included meaning and risk of False Positives. yes Discussion included meaning of total radiation exposure. yes  Counseling Included: Importance of adherence to annual lung cancer LDCT screening. yes Impact of comorbidities on ability to participate in the program. yes Ability and willingness to under diagnostic treatment. yes  Smoking Cessation Counseling: Current Smokers:  Discussed importance of smoking cessation. yes Information about tobacco cessation classes and  interventions provided to patient. yes Patient provided with "ticket" for LDCT Scan. yes Symptomatic Patient. no  Counseling NA Diagnosis Code: Tobacco Use Z72.0 Asymptomatic Patient yes  Counseling (Intermediate counseling: > three minutes counseling) P3825 Former Smokers:  Discussed the importance of maintaining cigarette abstinence. yes Diagnosis Code: Personal History of Nicotine Dependence. K53.976 Information about tobacco cessation classes and interventions provided to patient. Yes Patient provided with "ticket" for LDCT Scan. yes Written Order for Lung Cancer Screening with LDCT placed in Epic. Yes (CT Chest Lung Cancer Screening Low Dose W/O CM) BHA1937 Z12.2-Screening of respiratory organs Z87.891-Personal history of nicotine dependence  I spent 25 minutes of face to face time/virtual visit time  with  [name] discussing the risks and benefits of lung cancer screening. We took the time to pause the power point at intervals to allow for questions to be asked and answered to ensure understanding. We discussed that she had taken the single most powerful action possible to decrease her risk of developing lung cancer when she quit smoking. I counseled her to remain smoke free, and to contact me if she ever had the desire to smoke again so that I can provide resources and tools to help support the effort to remain smoke free. We discussed the time and location of the scan, and that either  Doroteo Glassman RN, Joella Prince, RN or I  or I will call / send a letter with the results within  24-72 hours of receiving them. She has the office contact information in the event she needs to speak with me,  she verbalized understanding of all of the above and had no further questions upon leaving the office.     I explained to the patient that  there has been a high incidence of coronary artery disease noted on these exams. I explained that this is a non-gated exam therefore degree or severity cannot  be determined. This patient is on statin therapy. I have asked the patient to follow-up with their PCP regarding any incidental finding of coronary artery disease and management with diet or medication as they feel is clinically indicated. The patient verbalized understanding of the above and had no further questions.     Magdalen Spatz, NP 10/17/2022

## 2022-10-18 ENCOUNTER — Ambulatory Visit
Admission: RE | Admit: 2022-10-18 | Discharge: 2022-10-18 | Disposition: A | Discharge status: Home / Self Care | Payer: PPO | Source: Ambulatory Visit | Attending: Family Medicine | Admitting: Family Medicine

## 2022-10-18 DIAGNOSIS — Z87891 Personal history of nicotine dependence: Secondary | ICD-10-CM | POA: Diagnosis not present

## 2022-10-18 DIAGNOSIS — Z122 Encounter for screening for malignant neoplasm of respiratory organs: Secondary | ICD-10-CM

## 2022-10-19 ENCOUNTER — Telehealth: Payer: Self-pay | Admitting: Acute Care

## 2022-10-19 NOTE — Telephone Encounter (Signed)
Received a call report for patient's lung cancer screening CT. Below is a copy of the impression:   IMPRESSION: 1. Lung-RADS 4A, suspicious. Follow up low-dose chest CT without contrast in 3 months (please use the following order, "CT CHEST LCS NODULE FOLLOW-UP W/O CM") is recommended. Alternatively, PET may be considered when there is a solid component 23m or larger. Solid posterior basilar left lower lobe pulmonary nodule measuring 11.4 mm in volume derived mean diameter. 2. Two-vessel coronary atherosclerosis. 3. Mild diffuse hepatic steatosis. 4. Stable left adrenal adenoma, for which no imaging follow-up is recommended. 5. Aortic Atherosclerosis (ICD10-I70.0) and Emphysema (ICD10-J43.9).  Will route to SJudson Rochand the lung cancer screening program.

## 2022-10-19 NOTE — Telephone Encounter (Signed)
Received report thru LCS dashboard and will contact patient

## 2022-10-25 ENCOUNTER — Telehealth: Payer: Self-pay | Admitting: Acute Care

## 2022-10-25 DIAGNOSIS — R911 Solitary pulmonary nodule: Secondary | ICD-10-CM

## 2022-10-25 DIAGNOSIS — I509 Heart failure, unspecified: Secondary | ICD-10-CM

## 2022-10-25 DIAGNOSIS — Z87891 Personal history of nicotine dependence: Secondary | ICD-10-CM

## 2022-10-25 NOTE — Telephone Encounter (Signed)
I have called the patient with the results of her low dose CT Chest. Her scan was read as a LR 4 A. She has an 11.4 mm nodule, and some trace effusions in the LLL. I consulted with Dr. Patsey Berthold, and she is in agreement with a 3 month follow up LDCT to re-evaluate these nodules. We also need to get her referred to cardiology to evaluate for heart failure / etiology of her trace effusions. We would like to ensure that effusions have resolved at follow up in 3 months.  Denise, 3 month follow up low dose CT Chest . She needs a cardiology referral to evaluate etiology of pleural effusions. Fax results to PCP. Thanks so much

## 2022-10-25 NOTE — Telephone Encounter (Signed)
Results/plan faxed to PCP.  Cardiology referral placed and new order placed for LCS nodule follow up CT for 3 months

## 2022-10-31 ENCOUNTER — Encounter: Payer: Self-pay | Admitting: Cardiovascular Disease

## 2022-10-31 ENCOUNTER — Ambulatory Visit: Payer: PPO | Attending: Cardiovascular Disease | Admitting: Cardiovascular Disease

## 2022-10-31 ENCOUNTER — Encounter: Payer: Self-pay | Admitting: Family Medicine

## 2022-10-31 VITALS — BP 120/72 | HR 58 | Ht 64.0 in | Wt 243.8 lb

## 2022-10-31 DIAGNOSIS — E785 Hyperlipidemia, unspecified: Secondary | ICD-10-CM

## 2022-10-31 DIAGNOSIS — I152 Hypertension secondary to endocrine disorders: Secondary | ICD-10-CM

## 2022-10-31 DIAGNOSIS — G894 Chronic pain syndrome: Secondary | ICD-10-CM

## 2022-10-31 DIAGNOSIS — E1159 Type 2 diabetes mellitus with other circulatory complications: Secondary | ICD-10-CM | POA: Diagnosis not present

## 2022-10-31 DIAGNOSIS — E1169 Type 2 diabetes mellitus with other specified complication: Secondary | ICD-10-CM

## 2022-10-31 DIAGNOSIS — M47896 Other spondylosis, lumbar region: Secondary | ICD-10-CM

## 2022-10-31 DIAGNOSIS — M48061 Spinal stenosis, lumbar region without neurogenic claudication: Secondary | ICD-10-CM

## 2022-10-31 DIAGNOSIS — M160 Bilateral primary osteoarthritis of hip: Secondary | ICD-10-CM

## 2022-10-31 DIAGNOSIS — I2510 Atherosclerotic heart disease of native coronary artery without angina pectoris: Secondary | ICD-10-CM

## 2022-10-31 DIAGNOSIS — G2581 Restless legs syndrome: Secondary | ICD-10-CM

## 2022-10-31 NOTE — Assessment & Plan Note (Signed)
History of hyperlipidemia on statin therapy lipid profile performed 04/01/2020 revealing total cholesterol 143, LDL 62 and HDL 51.  I am going to recheck a fasting lipid liver profile.

## 2022-10-31 NOTE — Assessment & Plan Note (Signed)
History of essential hypertension a blood pressure measured today at 120/72.  She is on hydrochlorothiazide, Lopressor and ramipril.

## 2022-10-31 NOTE — Assessment & Plan Note (Signed)
[name] was referred to me because of coronary calcification seen on screening chest CT 10/18/2022 in the LAD and RCA.  She is completely asymptomatic.  Her LDL is at goal for secondary prevention.  I am going to get a coronary calcium score to further quantitate.

## 2022-10-31 NOTE — Patient Instructions (Signed)
Medication Instructions:  Your physician recommends that you continue on your current medications as directed. Please refer to the Current Medication list given to you today.  *If you need a refill on your cardiac medications before your next appointment, please call your pharmacy*   Lab Work: Your physician recommends that you return for lab work in: the next week or 2 for FASTING lipid/liver panel.  If you have labs (blood work) drawn today and your tests are completely normal, you will receive your results only by: [place] (if you have MyChart) OR A paper copy in the mail If you have any lab test that is abnormal or we need to change your treatment, we will call you to review the results.   Testing/Procedures: Your physician has requested that you have an echocardiogram. Echocardiography is a painless test that uses sound waves to create images of your heart. It provides your doctor with information about the size and shape of your heart and how well your heart's chambers and valves are working. This procedure takes approximately one hour. There are no restrictions for this procedure. Please do NOT wear cologne, perfume, aftershave, or lotions (deodorant is allowed). Please arrive 15 minutes prior to your appointment time.  This procedure will be done at [address]   Dr. Gwenlyn Found has ordered a CT coronary calcium score.   Test locations:  [place]   This is $99 out of pocket.   Coronary CalciumScan A coronary calcium scan is an imaging test used to look for deposits of calcium and other fatty materials (plaques) in the inner lining of the blood vessels of the heart (coronary arteries). These deposits of calcium and plaques can partly clog and narrow the coronary arteries without producing any symptoms or warning signs. This puts a person at risk for a heart attack. This test can detect these deposits before symptoms  develop. Tell a health care provider about: Any allergies you have. All medicines you are taking, including vitamins, herbs, eye drops, creams, and over-the-counter medicines. Any problems you or family members have had with anesthetic medicines. Any blood disorders you have. Any surgeries you have had. Any medical conditions you have. Whether you are pregnant or may be pregnant. What are the risks? Generally, this is a safe procedure. However, problems may occur, including: Harm to a pregnant woman and her unborn baby. This test involves the use of radiation. Radiation exposure can be dangerous to a pregnant woman and her unborn baby. If you are pregnant, you generally should not have this procedure done. Slight increase in the risk of cancer. This is because of the radiation involved in the test. What happens before the procedure? No preparation is needed for this procedure. What happens during the procedure? You will undress and remove any jewelry around your neck or chest. You will put on a hospital gown. Sticky electrodes will be placed on your chest. The electrodes will be connected to an electrocardiogram (ECG) machine to record a tracing of the electrical activity of your heart. A CT scanner will take pictures of your heart. During this time, you will be asked to lie still and hold your breath for 2-3 seconds while a picture of your heart is being taken. The procedure may vary among health care providers and hospitals. What happens after the procedure? You can get dressed. You can return to your normal activities. It is up to you to get the results of your test.  Ask your health care provider, or the department that is doing the test, when your results will be ready. Summary A coronary calcium scan is an imaging test used to look for deposits of calcium and other fatty materials (plaques) in the inner lining of the blood vessels of the heart (coronary arteries). Generally, this is a  safe procedure. Tell your health care provider if you are pregnant or may be pregnant. No preparation is needed for this procedure. A CT scanner will take pictures of your heart. You can return to your normal activities after the scan is done. This information is not intended to replace advice given to you by your health care provider. Make sure you discuss any questions you have with your health care provider. Document Released: 04/20/2008 Document Revised: 09/11/2016 Document Reviewed: 09/11/2016 Elsevier Interactive Patient Education  2017 [place]: At [facility], you and your health needs are our priority.  As part of our continuing mission to provide you with exceptional heart care, we have created designated Provider Care Teams.  These Care Teams include your primary Cardiologist (physician) and Advanced Practice Providers (APPs -  Physician Assistants and Nurse Practitioners) who all work together to provide you with the care you need, when you need it.  We recommend signing up for the patient portal called "MyChart".  Sign up information is provided on this After Visit Summary.  MyChart is used to connect with patients for Virtual Visits (Telemedicine).  Patients are able to view lab/test results, encounter notes, upcoming appointments, etc.  Non-urgent messages can be sent to your provider as well.   To learn more about what you can do with MyChart, go to [email or web address].    Your next appointment:  We will see you on an as needed basis.  Provider:   Quay Burow, MD

## 2022-10-31 NOTE — Progress Notes (Signed)
10/31/2022 [name]   [birth year]  [id number]  Primary Physician [name], Gabriella Ohara, MD Primary Cardiologist: Gabriella Harp MD [name], [place]  HPI:  [name] is a 71 y.o. moderately overweight divorced Caucasian female mother of 4 children, grandmother of 68 grandchildren who was referred by Dr. Worthy Hernandez , her PCP, because of coronary calcification seen on chest CT. she is accompanied by one of her daughters [name] on the telephone today.  She is retired from spending 25 years in the [employer].  Her cardiac risk factors are notable for 90-pack-year tobacco abuse having quit 14 years ago.  She does have COPD.  She has treated hypertension, diabetes and hyperlipidemia.  She does have a strong family history for heart disease with father, sister and brother all of who had coronary artery disease.  She is never had myocardial infarction or stroke.  She denies chest pain but does get short of breath because of COPD.  She also has irritable bowel syndrome.  She apparently had a cardiac cath by Dr. Pernell Hernandez in 2008 which was normal.   Current Meds  Medication Sig   atorvastatin (LIPITOR) 20 MG tablet Take 1 tablet (20 mg total) by mouth daily.   buPROPion ER (WELLBUTRIN SR) 100 MG 12 hr tablet Take 100 mg by mouth 2 (two) times daily.   Calcium Carb-Cholecalciferol (CALCIUM-VITAMIN D) 600-400 MG-UNIT TABS Take 1 tablet by mouth daily.   Carboxymethylcellulose Sodium (THERATEARS) 0.25 % SOLN Place 1 drop into both eyes daily as needed (dry eyes).   citalopram (CELEXA) 20 MG tablet Take 20 mg by mouth at bedtime.   Continuous Blood Gluc Sensor (FREESTYLE LIBRE 14 DAY SENSOR) MISC APPLY EVERY 14 DAYS   dicyclomine (BENTYL) 20 MG tablet TAKE 1 TABLET BY MOUTH EVERY 8 (EIGHT) HOURS AS NEEDED FOR SPASMS.   fexofenadine (ALLEGRA) 180 MG tablet Take 180 mg by mouth daily.   fluticasone (FLONASE) 50 MCG/ACT nasal spray Place 2 sprays into both nostrils daily.    FLUZONE HIGH-DOSE QUADRIVALENT 0.7 ML SUSY    hydrochlorothiazide (HYDRODIURIL) 25 MG tablet TAKE 1 TABLET BY MOUTH EVERY DAY   HYDROcodone-acetaminophen (NORCO) 10-325 MG tablet Take 1 tablet by mouth every 6 (six) hours as needed for moderate pain.   hydrOXYzine (VISTARIL) 50 MG capsule Take 150 mg by mouth at bedtime.    KLOR-CON M10 10 MEQ tablet TAKE 4 TABLETS (40 MEQ TOTAL) BY MOUTH AT BEDTIME.   levothyroxine (SYNTHROID) 125 MCG tablet TAKE 1 TABLET (125 MCG TOTAL) BY MOUTH EVERY MORNING. 30 MINUTES BEFORE FOOD   loperamide (IMODIUM) 2 MG capsule TAKE 1 CAPSULE (2 MG TOTAL) BY MOUTH AS NEEDED FOR DIARRHEA OR LOOSE STOOLS.   LORazepam (ATIVAN) 0.5 MG tablet Take 0.5 mg by mouth 3 (three) times daily as needed.   metoprolol tartrate (LOPRESSOR) 50 MG tablet TAKE 1 TABLET BY MOUTH TWICE A DAY   morphine (Gabriella CONTIN) 15 MG 12 hr tablet Take 1 tablet (15 mg total) by mouth at bedtime.   Multiple Vitamins-Minerals (MULTIVITAMIN WITH MINERALS) tablet Take 1 tablet by mouth daily.   naloxone (NARCAN) nasal spray 4 mg/0.1 mL PLEASE SEE ATTACHED FOR DETAILED DIRECTIONS   omeprazole (PRILOSEC) 40 MG capsule TAKE 1 CAPSULE BY MOUTH EVERY DAY   PFIZER COVID-19 VAC BIVALENT injection    polyethylene glycol powder (GLYCOLAX/MIRALAX) 17 GM/SCOOP powder Take 17 g by mouth as needed for mild constipation.   prazosin (MINIPRESS) 2 MG capsule Take  2 mg by mouth at bedtime.   Probiotic Product (PROBIOTIC DAILY PO) Take 1 capsule by mouth daily.   prochlorperazine (COMPAZINE) 10 MG tablet Take 1 tablet (10 mg total) by mouth 2 (two) times daily as needed for nausea or vomiting. TAKE ONE TABLET BY MOUTH TWICE A DAY AS NEEDED FOR NAUSEA OR VOMITING   ramipril (ALTACE) 10 MG capsule TAKE 1 CAPSULE BY MOUTH EVERY DAY   senna-docusate (SENOKOT-S) 8.6-50 MG tablet Take 2 tablets by mouth at bedtime. For AFTER surgery, do not take if having diarrhea   simethicone (MYLICON) 80 MG chewable tablet Chew 160 mg by mouth  daily as needed (gas).   solifenacin (VESICARE) 5 MG tablet TAKE 1 TABLET (5 MG TOTAL) BY MOUTH DAILY.   tiZANidine (ZANAFLEX) 2 MG tablet TAKE 1 TABLET BY MOUTH EVERYDAY AT BEDTIME   traZODone (DESYREL) 50 MG tablet Take 150 mg by mouth at bedtime.   vitamin B-12 (CYANOCOBALAMIN) 1000 MCG tablet Take 1,000 mcg by mouth daily.     Allergies  Allergen Reactions   Diclofenac-Misoprostol Shortness Of Breath    elevated blood pressure   Aripiprazole     Doesn't want to take because of possible side effects   Emsam [Selegiline] Other (See Comments)    Burned the skin after using for a year   Estradiol Swelling   Ibuprofen Hives   Naproxen Nausea Only   Neurontin [Gabapentin] Hives and Swelling   Paroxetine Other (See Comments)    Panic attacks   Prednisone Other (See Comments)    Severe depression   Tape Other (See Comments)    Paper tape caused blisters, plastic tape ok    Social History   Socioeconomic History   Marital status: Divorced    Spouse name: Not on file   Number of children: 4   Years of education: 12   Highest education level: 12th grade  Occupational History   Occupation: disability  Tobacco Use   Smoking status: Former    Packs/day: 1.50    Years: 44.00    Total pack years: 66.00    Types: Cigarettes    Quit date: 04/06/2008    Years since quitting: 14.5    Passive exposure: Past   Smokeless tobacco: Never  Vaping Use   Vaping Use: Never used  Substance and Sexual Activity   Alcohol use: No   Drug use: Yes    Types: Marijuana    Comment: hx of marijuana use at bedtime, last used 05/30/22   Sexual activity: Not Currently  Other Topics Concern   Not on file  Social History Narrative   [facility] POA document appoints [name] as agent for [name]. [name] (DOB [birth year])/ T. McDiarmid, MD 04/28/21      Retired [place] [employer] worker - early retirement for mental health reasons.    Has Worked part-time at [employer] as a Scientist, water quality. Patient stopped during Covid.    Patient is divorced.   Quit smoking tobacco June 09 started at age 69. 1 1/2 to 2 ppd .   No etoh    Hx of smokes marijuana - periodically attempts quitting.   Religious - Pentecostal   4 children    Hobbies- word searches and games on her tablet.   No IVDA      Social Information (01/11/2022)    patient lives with daughter in one level apartment ,for past 2 years .    Patient enjoys playing games on her ipad and watching movies . Primary  family support persons is her daughter.    Transportation to appointments provided by self;  No concerns or difficulty affording medication:   Strengths:Financial means, Religious Affiliation, Supportive family/friends   Support System: Family, Church, Spirituality and Able to International aid/development worker Activities of Daily Living    Dressing:Self-care   Eating: Self-care   Ambulation: Partial assistance   Toileting: Self-care   Bathing: Self-care       Instrumental Activities of Daily Living   Shopping: Partial assistance   House/Yard Work: Self-care   Administration of medications: Self-care   Finances: Self-care   Telephone: Self-care   Transportation: Self-care      Social Determinants of Health   Financial Resource Strain: Low Risk  (01/11/2022)   Overall Financial Resource Strain (CARDIA)    Difficulty of Paying Living Expenses: Not hard at all  Food Insecurity: No Food Insecurity (01/11/2022)   Hunger Vital Sign    Worried About Running Out of Food in the Last Year: Never true    [place] in the Last Year: Never true  Transportation Needs: No Transportation Needs (09/12/2022)   PRAPARE - Hydrologist (Medical): No    Lack of Transportation (Non-Medical): No  Physical Activity: Inactive (01/11/2022)   Exercise Vital Sign    Days of Exercise per Week: 0 days    Minutes of Exercise per Session: 0 min  Stress: Stress Concern Present (01/11/2022)   [place]    Feeling of Stress : To some extent  Social Connections: Socially Isolated (01/11/2022)   Social Connection and Isolation Panel [NHANES]    Frequency of Communication with Friends and Family: More than three times a week    Frequency of Social Gatherings with Friends and Family: More than three times a week    Attends Religious Services: Never    Marine scientist or Organizations: No    Attends Archivist Meetings: Never    Marital Status: Divorced  Human resources officer Violence: Not At Risk (01/11/2022)   Humiliation, Afraid, Rape, and Kick questionnaire    Fear of Current or Ex-Partner: No    Emotionally Abused: No    Physically Abused: No    Sexually Abused: No     Review of Systems: General: negative for chills, fever, night sweats or weight changes.  Cardiovascular: negative for chest pain, dyspnea on exertion, edema, orthopnea, palpitations, paroxysmal nocturnal dyspnea or shortness of breath Dermatological: negative for rash Respiratory: negative for cough or wheezing Urologic: negative for hematuria Abdominal: negative for nausea, vomiting, diarrhea, bright red blood per rectum, melena, or hematemesis Neurologic: negative for visual changes, syncope, or dizziness All other systems reviewed and are otherwise negative except as noted above.    Blood pressure 120/72, pulse (!) 58, height '5\' 4"'$  (1.626 m), weight 243 lb 12.8 oz (110.6 kg), SpO2 94 %.  General appearance: alert and no distress Neck: no adenopathy, no carotid bruit, no JVD, supple, symmetrical, trachea midline, and thyroid not enlarged, symmetric, no tenderness/mass/nodules Lungs: clear to auscultation bilaterally Heart: regular rate and rhythm, S1, S2 normal, no murmur, click, rub or gallop Extremities: extremities normal, atraumatic, no cyanosis or edema Pulses: 2+ and symmetric Skin: Skin color, texture, turgor normal. No rashes or lesions Neurologic: Grossly  normal  EKG sinus bradycardia 58 with nonspecific ST and T wave changes.  Personally reviewed this EKG.  ASSESSMENT AND PLAN:   Hypertension  associated with diabetes ([place]) History of essential hypertension a blood pressure measured today at 120/72.  She is on hydrochlorothiazide, Lopressor and ramipril.  Hyperlipidemia associated with type 2 diabetes mellitus ([place]) History of hyperlipidemia on statin therapy lipid profile performed 04/01/2020 revealing total cholesterol 143, LDL 62 and HDL 51.  I am going to recheck a fasting lipid liver profile.  Coronary artery calcification seen on CT scan Gabriella. Shingledecker was referred to me because of coronary calcification seen on screening chest CT 10/18/2022 in the LAD and RCA.  She is completely asymptomatic.  Her LDL is at goal for secondary prevention.  I am going to get a coronary calcium score to further quantitate.     Gabriella Harp MD FACP,FACC,FAHA, [facility] 10/31/2022 3:10 PM

## 2022-11-01 MED ORDER — HYDROCODONE-ACETAMINOPHEN 10-325 MG PO TABS: 1.0000 | ORAL_TABLET | Freq: Four times a day (QID) | ORAL | 0 refills | Status: DC | PRN

## 2022-11-01 MED ORDER — MORPHINE SULFATE ER 15 MG PO TBCR: 15.0000 mg | EXTENDED_RELEASE_TABLET | Freq: Every day | ORAL | 0 refills | Status: DC

## 2022-11-02 DIAGNOSIS — I2510 Atherosclerotic heart disease of native coronary artery without angina pectoris: Secondary | ICD-10-CM | POA: Diagnosis not present

## 2022-11-02 DIAGNOSIS — E1169 Type 2 diabetes mellitus with other specified complication: Secondary | ICD-10-CM | POA: Diagnosis not present

## 2022-11-02 DIAGNOSIS — E1159 Type 2 diabetes mellitus with other circulatory complications: Secondary | ICD-10-CM | POA: Diagnosis not present

## 2022-11-02 DIAGNOSIS — I152 Hypertension secondary to endocrine disorders: Secondary | ICD-10-CM | POA: Diagnosis not present

## 2022-11-02 DIAGNOSIS — E785 Hyperlipidemia, unspecified: Secondary | ICD-10-CM | POA: Diagnosis not present

## 2022-11-02 LAB — HEPATIC FUNCTION PANEL
ALT: 14 IU/L (ref 0–32)
AST: 15 IU/L (ref 0–40)
Albumin: 4.6 g/dL (ref 3.8–4.8)
Alkaline Phosphatase: 77 IU/L (ref 44–121)
Bilirubin Total: 0.3 mg/dL (ref 0.0–1.2)
Bilirubin, Direct: <0.1 mg/dL (ref 0.00–0.40)
Total Protein: 6.5 g/dL (ref 6.0–8.5)

## 2022-11-02 LAB — LIPID PANEL
Chol/HDL Ratio: 3 ratio (ref 0.0–4.4)
Cholesterol, Total: 145 mg/dL (ref 100–199)
HDL: 48 mg/dL (ref >39)
LDL Chol Calc (NIH): 63 mg/dL (ref 0–99)
Triglycerides: 211 mg/dL — ABNORMAL HIGH (ref 0–149)
VLDL Cholesterol Cal: 34 mg/dL (ref 5–40)

## 2022-11-08 ENCOUNTER — Ambulatory Visit (HOSPITAL_COMMUNITY)
Admission: RE | Admit: 2022-11-08 | Discharge: 2022-11-08 | Disposition: A | Discharge status: Home / Self Care | Payer: PPO | Source: Ambulatory Visit | Attending: Cardiovascular Disease | Admitting: Cardiovascular Disease

## 2022-11-08 DIAGNOSIS — E1169 Type 2 diabetes mellitus with other specified complication: Secondary | ICD-10-CM | POA: Insufficient documentation

## 2022-11-08 DIAGNOSIS — I2510 Atherosclerotic heart disease of native coronary artery without angina pectoris: Secondary | ICD-10-CM | POA: Insufficient documentation

## 2022-11-08 DIAGNOSIS — E1159 Type 2 diabetes mellitus with other circulatory complications: Secondary | ICD-10-CM | POA: Insufficient documentation

## 2022-11-08 DIAGNOSIS — I152 Hypertension secondary to endocrine disorders: Secondary | ICD-10-CM | POA: Insufficient documentation

## 2022-11-08 DIAGNOSIS — E785 Hyperlipidemia, unspecified: Secondary | ICD-10-CM | POA: Insufficient documentation

## 2022-11-09 ENCOUNTER — Telehealth: Payer: Self-pay | Admitting: Cardiovascular Disease

## 2022-11-09 NOTE — Telephone Encounter (Signed)
Spoke with pt, calcium scoring CT scan discussed with the patient and all questions were answered.

## 2022-11-09 NOTE — Telephone Encounter (Signed)
Patient stated she received her test results but would like a call back to discuss them.

## 2022-11-28 ENCOUNTER — Ambulatory Visit (HOSPITAL_COMMUNITY): Payer: PPO | Attending: Cardiovascular Disease

## 2022-11-28 DIAGNOSIS — E785 Hyperlipidemia, unspecified: Secondary | ICD-10-CM | POA: Insufficient documentation

## 2022-11-28 DIAGNOSIS — I2510 Atherosclerotic heart disease of native coronary artery without angina pectoris: Secondary | ICD-10-CM | POA: Diagnosis not present

## 2022-11-28 DIAGNOSIS — E1159 Type 2 diabetes mellitus with other circulatory complications: Secondary | ICD-10-CM | POA: Diagnosis not present

## 2022-11-28 DIAGNOSIS — E1169 Type 2 diabetes mellitus with other specified complication: Secondary | ICD-10-CM | POA: Diagnosis not present

## 2022-11-28 DIAGNOSIS — I152 Hypertension secondary to endocrine disorders: Secondary | ICD-10-CM | POA: Diagnosis not present

## 2022-11-28 DIAGNOSIS — I10 Essential (primary) hypertension: Secondary | ICD-10-CM

## 2022-11-30 ENCOUNTER — Ambulatory Visit (INDEPENDENT_AMBULATORY_CARE_PROVIDER_SITE_OTHER): Payer: PPO | Admitting: Family Medicine

## 2022-11-30 ENCOUNTER — Encounter: Payer: Self-pay | Admitting: Family Medicine

## 2022-11-30 VITALS — BP 130/44 | HR 60 | Ht 64.0 in | Wt 244.4 lb

## 2022-11-30 DIAGNOSIS — E1159 Type 2 diabetes mellitus with other circulatory complications: Secondary | ICD-10-CM | POA: Diagnosis not present

## 2022-11-30 DIAGNOSIS — G894 Chronic pain syndrome: Secondary | ICD-10-CM

## 2022-11-30 DIAGNOSIS — G2581 Restless legs syndrome: Secondary | ICD-10-CM | POA: Diagnosis not present

## 2022-11-30 DIAGNOSIS — E669 Obesity, unspecified: Secondary | ICD-10-CM | POA: Diagnosis not present

## 2022-11-30 DIAGNOSIS — M47896 Other spondylosis, lumbar region: Secondary | ICD-10-CM

## 2022-11-30 DIAGNOSIS — I152 Hypertension secondary to endocrine disorders: Secondary | ICD-10-CM | POA: Diagnosis not present

## 2022-11-30 DIAGNOSIS — M48061 Spinal stenosis, lumbar region without neurogenic claudication: Secondary | ICD-10-CM | POA: Diagnosis not present

## 2022-11-30 DIAGNOSIS — M25562 Pain in left knee: Secondary | ICD-10-CM

## 2022-11-30 DIAGNOSIS — E1169 Type 2 diabetes mellitus with other specified complication: Secondary | ICD-10-CM

## 2022-11-30 DIAGNOSIS — M160 Bilateral primary osteoarthritis of hip: Secondary | ICD-10-CM

## 2022-11-30 LAB — POCT GLYCOSYLATED HEMOGLOBIN (HGB A1C): HbA1c, POC (controlled diabetic range): 5.9 % (ref 0.0–7.0)

## 2022-11-30 LAB — ECHOCARDIOGRAM COMPLETE
Area-P 1/2: 3.6 cm2
Est EF: 55
P 1/2 time: 579 msec
S' Lateral: 3.2 cm

## 2022-11-30 MED ORDER — HYDROCODONE-ACETAMINOPHEN 10-325 MG PO TABS: 1.0000 | ORAL_TABLET | Freq: Four times a day (QID) | ORAL | 0 refills | Status: DC | PRN

## 2022-11-30 MED ORDER — MORPHINE SULFATE ER 15 MG PO TBCR: 15.0000 mg | EXTENDED_RELEASE_TABLET | Freq: Every day | ORAL | 0 refills | Status: DC

## 2022-11-30 MED ORDER — KNEE COMPRESSION SLEEVE/L/XL MISC: 1.0000 | Freq: Every day | 0 refills | Status: DC

## 2022-11-30 NOTE — Progress Notes (Signed)
[name] is alone Sources of clinical information for visit is/are patient. Nursing assessment for this office visit was reviewed with the patient for accuracy and revision.     Previous Report(s) Reviewed: none     11/30/2022   10:11 AM  Depression screen PHQ 2/9  Decreased Interest 2  Down, Depressed, Hopeless 1  PHQ - 2 Score 3  Altered sleeping 1  Tired, decreased energy 3  Change in appetite 1  Feeling bad or failure about yourself  2  Trouble concentrating 2  Moving slowly or fidgety/restless 0  Suicidal thoughts 0  PHQ-9 Score 12  Difficult doing work/chores Very difficult   [place] Visit from 11/30/2022 in [place] Office Visit from 08/24/2022 in [place] Office Visit from 05/18/2022 in [place]  Thoughts that you would be better off dead, or of hurting yourself in some way Not at all Not at all Not at all  PHQ-9 Total Score '12 13 11          '$ 11/30/2022   10:12 AM 08/24/2022    1:41 PM 05/18/2022   11:01 AM 04/19/2022   11:25 AM 03/02/2022   11:01 AM  [place] in the past year? 0 0 0 0 0  Number falls in past yr: 0 0 0 0 0  Injury with Fall? 0 0 0 0 0       11/30/2022   10:11 AM 08/24/2022    1:40 PM 05/18/2022   11:00 AM  PHQ9 SCORE ONLY  PHQ-9 Total Score '12 13 11    '$ There are no preventive care reminders to display for this patient.  Health Maintenance Due  Topic Date Due   FOOT EXAM  09/16/2021   COVID-19 Vaccine (8 - 2023-24 season) 09/28/2022   Medicare Annual Wellness (AWV)  01/12/2023      History/P.E. limitations: none  There are no preventive care reminders to display for this patient.  Diabetes Health Maintenance Due  Topic Date Due   FOOT EXAM  09/16/2021   HEMOGLOBIN A1C  05/31/2023   OPHTHALMOLOGY EXAM  07/21/2023    Health Maintenance Due  Topic Date Due   FOOT EXAM  09/16/2021   COVID-19 Vaccine (8 - 2023-24 season) 09/28/2022    Medicare Annual Wellness (AWV)  01/12/2023     Chief Complaint  Patient presents with   Knee Pain     --------------------------------------------------------------------------------------------------------------------------------------------- Visit Problem List with A/P  Diabetes mellitus type 2 in obese [facility]) Lab Results  Component Value Date   HGBA1C 5.9 11/30/2022   Established problem Well Controlled and is at goal of < 7.5%. No signs of complications, medication side effects, or red flags. Continue lifestyle managment   Hypertension associated with diabetes ([place]) Established problem. Adequate blood pressure control.  No evidence of new end organ damage.  Tolerating medication without significant adverse effects.  Plan to continue current blood pressure medication regiment.    Knee pain, acute New complaint Complaining of left lateral knee pain began couple weeks ago with kneeling on bed N pain currently, recurrs intermittently with standing right Knee General Effusion: absent Erythema: absent Increased Warmth: absent  Tenderness: TTP area over anterior portion of humeral lateral epicondyle  Maneuvers for ACL tear Anterior Drawer: present  Maneuver for meniscus tear McMurray: absent Tender joint line: no  no collateral ligament laxity and posterior patella tenderness   Range of Motion: not limited  A/  Working explanation is possibly an acute tendonopathy/enthesopathy of vatus lateralis or popliteal mm.   P/ Topical diclofenac Compression knee sleeve prn Avoid provocative body positioning  Chronic pain syndrome Established problem - Adequate chronic pain control Reduction in her monthly supply of MS Contin 15 mg to 30 tab, one tab daily   Each month the number of HC-APAP tablets prescribed will drop by two tablets. Today's refill is for #56 tab   RTC 3 months

## 2022-11-30 NOTE — Patient Instructions (Addendum)
Refilled your MS Contin and Hydrocodone.  Sent in a knee sleeve for your knee pain.  Keep active. Avoid getting down on knees.    Your A1c is 5.9%. Excellent.

## 2022-12-01 DIAGNOSIS — M25569 Pain in unspecified knee: Secondary | ICD-10-CM

## 2022-12-01 HISTORY — DX: Pain in unspecified knee: M25.569

## 2022-12-01 NOTE — Assessment & Plan Note (Signed)
Established problem. Adequate blood pressure control.  No evidence of new end organ damage.  Tolerating medication without significant adverse effects.  Plan to continue current blood pressure medication regiment.   

## 2022-12-01 NOTE — Assessment & Plan Note (Signed)
New complaint Complaining of left lateral knee pain began couple weeks ago with kneeling on bed N pain currently, recurrs intermittently with standing right Knee General Effusion: absent Erythema: absent Increased Warmth: absent  Tenderness: TTP area over anterior portion of humeral lateral epicondyle  Maneuvers for ACL tear Anterior Drawer: present  Maneuver for meniscus tear McMurray: absent Tender joint line: no  no collateral ligament laxity and posterior patella tenderness   Range of Motion: not limited  A/ Working explanation is possibly an acute tendonopathy/enthesopathy of vatus lateralis or popliteal mm.   P/ Topical diclofenac Compression knee sleeve prn Avoid provocative body positioning

## 2022-12-01 NOTE — Assessment & Plan Note (Signed)
Lab Results  Component Value Date   HGBA1C 5.9 11/30/2022   Established problem Well Controlled and is at goal of < 7.5%. No signs of complications, medication side effects, or red flags. Continue lifestyle managment

## 2022-12-01 NOTE — Assessment & Plan Note (Signed)
Established problem - Adequate chronic pain control Reduction in her monthly supply of MS Contin 15 mg to 30 tab, one tab daily   Each month the number of HC-APAP tablets prescribed will drop by two tablets. Today's refill is for #56 tab   RTC 3 months

## 2022-12-06 ENCOUNTER — Encounter: Payer: Self-pay | Admitting: Family Medicine

## 2022-12-06 DIAGNOSIS — K580 Irritable bowel syndrome with diarrhea: Secondary | ICD-10-CM

## 2022-12-07 MED ORDER — LOPERAMIDE HCL 2 MG PO CAPS: 2.0000 mg | ORAL_CAPSULE | ORAL | 5 refills | Status: DC | PRN

## 2022-12-11 ENCOUNTER — Other Ambulatory Visit: Payer: Self-pay | Admitting: Family Medicine

## 2022-12-11 DIAGNOSIS — I10 Essential (primary) hypertension: Secondary | ICD-10-CM

## 2022-12-29 ENCOUNTER — Other Ambulatory Visit: Payer: Self-pay | Admitting: Family Medicine

## 2022-12-29 DIAGNOSIS — M797 Fibromyalgia: Secondary | ICD-10-CM

## 2022-12-31 ENCOUNTER — Encounter: Payer: Self-pay | Admitting: Family Medicine

## 2022-12-31 DIAGNOSIS — M47896 Other spondylosis, lumbar region: Secondary | ICD-10-CM

## 2022-12-31 DIAGNOSIS — G2581 Restless legs syndrome: Secondary | ICD-10-CM

## 2022-12-31 DIAGNOSIS — M48061 Spinal stenosis, lumbar region without neurogenic claudication: Secondary | ICD-10-CM

## 2022-12-31 DIAGNOSIS — M160 Bilateral primary osteoarthritis of hip: Secondary | ICD-10-CM

## 2022-12-31 DIAGNOSIS — G894 Chronic pain syndrome: Secondary | ICD-10-CM

## 2023-01-01 MED ORDER — MORPHINE SULFATE ER 15 MG PO TBCR: 15.0000 mg | EXTENDED_RELEASE_TABLET | Freq: Every day | ORAL | 0 refills | Status: DC

## 2023-01-01 MED ORDER — HYDROCODONE-ACETAMINOPHEN 10-325 MG PO TABS: 1.0000 | ORAL_TABLET | Freq: Four times a day (QID) | ORAL | 0 refills | Status: DC | PRN

## 2023-01-02 ENCOUNTER — Encounter: Payer: Self-pay | Admitting: Family Medicine

## 2023-01-02 DIAGNOSIS — M5440 Lumbago with sciatica, unspecified side: Secondary | ICD-10-CM

## 2023-01-03 ENCOUNTER — Other Ambulatory Visit: Payer: Self-pay | Admitting: Family Medicine

## 2023-01-03 DIAGNOSIS — E876 Hypokalemia: Secondary | ICD-10-CM

## 2023-01-15 ENCOUNTER — Other Ambulatory Visit: Payer: Self-pay | Admitting: Family Medicine

## 2023-01-15 DIAGNOSIS — K219 Gastro-esophageal reflux disease without esophagitis: Secondary | ICD-10-CM

## 2023-01-15 DIAGNOSIS — E1169 Type 2 diabetes mellitus with other specified complication: Secondary | ICD-10-CM

## 2023-01-17 ENCOUNTER — Other Ambulatory Visit: Payer: PPO

## 2023-01-17 ENCOUNTER — Ambulatory Visit
Admission: RE | Admit: 2023-01-17 | Discharge: 2023-01-17 | Disposition: A | Discharge status: Home / Self Care | Payer: PPO | Source: Ambulatory Visit | Attending: Acute Care | Admitting: Acute Care

## 2023-01-17 DIAGNOSIS — J439 Emphysema, unspecified: Secondary | ICD-10-CM | POA: Diagnosis not present

## 2023-01-17 DIAGNOSIS — R918 Other nonspecific abnormal finding of lung field: Secondary | ICD-10-CM | POA: Diagnosis not present

## 2023-01-17 DIAGNOSIS — R911 Solitary pulmonary nodule: Secondary | ICD-10-CM | POA: Diagnosis not present

## 2023-01-17 DIAGNOSIS — Z87891 Personal history of nicotine dependence: Secondary | ICD-10-CM

## 2023-01-17 DIAGNOSIS — I700 Atherosclerosis of aorta: Secondary | ICD-10-CM | POA: Diagnosis not present

## 2023-01-21 NOTE — Therapy (Unsigned)
OUTPATIENT PHYSICAL THERAPY THORACOLUMBAR EVALUATION   Patient Name: [name] MRN: [id number] DOB:16-Apr-1951, 72 y.o., female Today's Date: 01/22/2023  END OF SESSION:  PT End of Session - 01/22/23 1453     Visit Number 1    Number of Visits 2    Date for PT Re-Evaluation 03/19/23    Authorization Type HTA    PT Start Time 1445    PT Stop Time [id number]    PT Time Calculation (min) 45 min    Activity Tolerance Patient tolerated treatment well    Behavior During Therapy [facility] for tasks assessed/performed             Past Medical History:  Diagnosis Date   Adrenal adenoma, left 12/02/2020   CT AP 11/16/20:  left adrenal mass measuring approximately 2.7 cm. This is minimally increased in size from prior study and is consistent with a benign adrenal adenoma.    Adrenal nodule ([place]) 04/08/2011   04/08/11 Abdominal CT showed Left adrenal nodule which is tachnically indeterminate but most likely an adenoma.  It is 1.7 cm and 42 HU on portal venous phase image. 31 HU on delayed images.   01/09/2012 Abdominal CT w/ & w/o CM: Stable benign left adrenal adenoma. No further specific follow-up is needed for this finding.     Allergic rhinitis 01/03/2007   Qualifier: Diagnosis of  By: Drucie Ip     Allergy    Anemia    hx of  in 20's   Anxiety    Bipolar disorder ([place])    Carpal tunnel syndrome 02/14/2007   S/P carpel tunnel release bilaterally.     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 03/28/2018 03/28/2018   COPD WITHOUT EXACERBATION 01/03/2007   Qualifier: Diagnosis of  By: Drucie Ip  emphysema   Diabetes mellitus without complication ([place])    [id number] 6.5 as of 05-2019- diet changes, no meds currently    DISC WITH RADICULOPATHY 01/03/2007   Qualifier: Diagnosis of  By: Drucie Ip     Emphysema of lung ([place])    Esophageal reflux 01/03/2007   Centricity  Description: GASTROESOPHAGEAL REFLUX, NO ESOPHAGITIS Qualifier: Diagnosis of  By: Drucie Ip   Centricity Description: GERD Qualifier: Diagnosis of  By: Tye Savoy MD, [place]     External hemorrhoids s/p hemorrhoidectomy 03/28/2018 03/28/2018   Family history of breast cancer    Family history of cancer of female genital organ    Family history of colon cancer    Family history of gene mutation    BRIP1 gene mutation (c.2010dup) in daughter   Family history of lung cancer    Fibromyalgia    Fracture of bone spur of inferior portion of calcaneus 10/06/2018   Functional gastrointestinal disorder 06/18/2008   Functional Gastrointestinal Disorder with frequent eructation and bloating sensation with acute flares.  Responsive to Compazine and Bentyl.  Takes a few days to calm down.     GERD without esophagitis 01/03/2007   Centricity Description: GASTROESOPHAGEAL REFLUX, NO ESOPHAGITIS Qualifier: Diagnosis of  By: Drucie Ip   Centricity Description: GERD Qualifier: Diagnosis of  By: Tye Savoy MD, [place]     Hearing impairment 04/28/2021   Heel spur, fracture, left 04/17/2018   HEMORRHOIDS, NOS 01/03/2007   Qualifier: Diagnosis of  By: Drucie Ip     Hepatic steatosis 04/12/2011   Herpes simplex labialis 05/11/2011   Hip osteoarthritis 05/23/2007  MRI Pelvis (03/13/07) Bilateral osteoarthritis of the hips, left greater than right.  The  patient has a slightly more prominent hip effusion on the left than  the right.  No other significant abnormality.     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   Major depressive disorder, recurrent episode ([place]) 01/03/2007   Qualifier: Diagnosis of  By: Drucie Ip     Medication management 12/14/2016   Managing topiramate for weight loss starting 12/14/16   Memory difficulties [id number]   Monoallelic mutation of BRIP1 gene 01/17/2021   c.2010dup (p.Glu671*)   No impairment of memory 04/29/2021   Obesity (BMI 30.0-34.9) 09/30/2007   Has lost 55 lbs since easter. Goal of <190 by 08/05/12.      Obesity, Class III, BMI 40-49.9 (morbid obesity) ([place]) 09/30/2007        Osteoarthritis of spine 01/03/2007   MR I Lumbar (08/03): GeneralDDD; L2-3 Large  HNP.  Mod pos  hnp - 12/14/2005, smhnpw/irritL4root - 12/14/2005     OSTEOARTHRITIS, HANDS, BILATERAL 06/18/2008   Qualifier: Diagnosis of  By: McDiarmid MD, Kristeen Miss FEMORAL STRESS SYNDROME 01/03/2007   Qualifier: Diagnosis of  By: Drucie Ip     Periodic limb movements of sleep 12/05/2010   Diagnosed on Polysomnography testing Fall 2011.      Pneumonia    walking pneumonia in the 90's   Pre-diabetes 12/25/2008   Qualifier: Diagnosis of  By: McDiarmid MD, Todd     Prolapsed internal hemorrhoids, grade 3, s/p ligation/pexy/hemorrhoidectomy 03/28/2018 03/28/2018   RESTLESS LEGS SYNDROME 09/11/2007   Polysomnography (10/2010, Dr Keturah Barre, interpreter. ) showed periodic limb movements that frequently awoke patient from sleep with arousal index of 4 per hour. Dr Danton Sewer (Sleep Specialist) thought her RLS was the major contributor to patient poor sleep condition, more than any sleep apnea.  He recommended considering opamine agonist (either requip or mirapex) to see if this will help.      Rosacea, acne 10/24/2011   SIGMOID  POLYP 01/23/2008   Solitary lung nodule 04/08/2011   Stage 1 mild COPD by GOLD classification ([place]) 01/03/2007   11/28/2017 Chest CT: Mild centrilobular and paraseptal emphysema with mild diffuse bronchial wall thickening     Stress incontinence    Synovial cyst of lumbar facet joint 02/17/2016   Trigger thumb of left hand 08/16/2018   Urticaria, chronic 05/22/2011   VITAMIN D DEFICIENCY 03/03/2010   Work-related condition 05/17/2018   Past Surgical History:  Procedure Laterality Date   APPENDECTOMY     bladder tack  1998   Dr Jeffie Pollock (urology)   BUNIONECTOMY  1992   bilateral feet with pins in great toes   [place]  2010   Bilateral, Dr Theodis Sato   COLONOSCOPY  2004  Dr Verdia Kuba (GI)   CYSTOCELE REPAIR  1998    Dr Gertie Fey (GYN)-   ESOPHAGOGASTRODUODENOSCOPY     EVALUATION UNDER ANESTHESIA WITH HEMORRHOIDECTOMY N/A 03/28/2018   Procedure: ANORECTAL EXAM UNDER ANESTHESIA lateral internal partial sphincterotomy, hemorrhoidal ligation pixie, HEMORRHOIDECTOMY;  Surgeon: Michael Boston, MD;  Location: [facility];  Service: General;  Laterality: N/A;   EXAM ANORECTAL W/ ULTRASOUND     Dr. Johney Maine 03-28-18    EXPLORATORY LAPAROTOMY     FACIAL LACERATIONS REPAIR     Facial Laceration repair as adolescent    INJECTION HIP INTRA ARTICULAR  2008   Left Hip fluoroscopically-guided corticosteorid injection for painful osteoarthritis with good effect (06/2007)   LAPAROSCOPIC LYSIS OF ADHESIONS  05/19/2021   Procedure: LAPAROSCOPIC LYSIS OF ADHESIONS; BIOPSY ABDOMINAL SKIN LESION;  Surgeon: Lafonda Mosses, MD;  Location: [facility];  Service: Gynecology;;   LAPAROSCOPY FOR ECTOPIC PREGNANCY     LUMBAR LAMINECTOMY/DECOMPRESSION MICRODISCECTOMY N/A 06/21/2016   Procedure: MICRO LUMBAR DECOMPRESSION L4-L5 AND REMOVAL OF SYNOVIAL CYST    1 LEVEL;  Surgeon: Susa Day, MD;  Location: [facility];  Service: Orthopedics;  Laterality: N/A;   MRSA     NM MYOVIEW LTD   2011   Dr Daneen Schick, III (Card)   RECTOCELE REPAIR  1998   Dr Gertie Fey (GYN)   ROBOTIC ASSISTED SALPINGO OOPHERECTOMY N/A 05/19/2021   Procedure: XI ROBOTIC ASSISTED LEFT SALPINGO- OOPHORECTOMY WITH PELVIC WASHINGS;  Surgeon: Lafonda Mosses, MD;  Location: [facility];  Service: Gynecology;  Laterality: N/A;   SPHINCTEROTOMY N/A 03/28/2018   Procedure: ANAL SPHINCTEROTOMY;  Surgeon: Michael Boston, MD;  Location: [facility];  Service: General;  Laterality: N/A;   UPPER GASTROINTESTINAL ENDOSCOPY     Patient Active Problem List   Diagnosis Date Noted   Knee pain, acute 12/01/2022   Leukoplakia of oral cavity 09/20/2022   Polypharmacy 04/29/2021   Arteriosclerosis of abdominal aorta ([place]) 12/02/2020   Urinary incontinence, mixed 04/02/2020   Diabetes mellitus type 2 in obese ([place]) 06/30/2019   Type 2 diabetes mellitus without complications ([place]) [id number]   Coronary artery calcification seen on CT scan 12/03/2017   Spinal stenosis of lumbar region 06/21/2016   Encounter for chronic pain management [id number]   Metabolic syndrome 123XX123   NAFL (nonalcoholic fatty liver) 123XX123    Class: Chronic   Vitamin D deficiency 03/03/2010   History of tobacco abuse 11/10/2009   Hyperlipidemia associated with type 2 diabetes mellitus ([place]) 12/24/2008   Chronic pain syndrome 12/24/2008   Functional gastrointestinal disorder 06/18/2008   HYPERTRIGLYCERIDEMIA 11/11/2007   Obesity, Class III, BMI 40-49.9 (morbid obesity) ([place]) 09/30/2007   RESTLESS LEGS SYNDROME 09/11/2007   Fibromyalgia syndrome 07/15/2007   Degenerative joint disease of both hips 05/23/2007   Hypothyroidism 02/15/2007   Bipolar 2 disorder ([place]) 01/03/2007   Hypertension associated with diabetes ([place]) 01/03/2007   Allergic rhinitis 01/03/2007   GERD without esophagitis 01/03/2007   Irritable bowel syndrome 01/03/2007   Osteoarthritis of spine 01/03/2007    PCP: McDiarmid, Blane Ohara, MD  REFERRING PROVIDER: McDiarmid, Blane Ohara, MD  REFERRING DIAG: M54.40 (ICD-10-CM) - Bilateral low back pain with sciatica, sciatica laterality unspecified, unspecified chronicity  Rationale for Evaluation and Treatment: Rehabilitation  THERAPY DIAG:  Other low back pain - Plan: PT plan of care cert/re-cert  Muscle weakness (generalized) - Plan: PT plan of care cert/re-cert  ONSET DATE: chronic  SUBJECTIVE:  SUBJECTIVE STATEMENT: Describes a history of R sided low back and RLE pain/sciatica  PERTINENT HISTORY:  Chronic pain syndrome Established problem - Adequate chronic pain control Reduction in her monthly supply of MS Contin 15 mg to 30 tab, one tab daily  PAIN:  Are you having pain? Yes: NPRS scale: 7-8/10 Pain location: R low back and RLE Pain description: ache Aggravating factors: prolonged positions and standing  Relieving factors: rest  PRECAUTIONS: None  WEIGHT BEARING RESTRICTIONS: No  FALLS:  Has patient fallen in last 6 months? No    OCCUPATION: retired  PLOF: Independent  PATIENT GOALS: To establish a HEP  NEXT MD VISIT: 3 months  OBJECTIVE:   DIAGNOSTIC FINDINGS:  none  PATIENT SURVEYS:  FOTO deferred due to one time visit  SCREENING FOR RED FLAGS: negative   MUSCLE LENGTH: Hamstrings: negative Thomas test: positive R  POSTURE: rounded shoulders and forward head  PALPATION: deferred  LUMBAR ROM: deferred due to pain  AROM eval  Flexion   Extension   Right lateral flexion   Left lateral flexion   Right rotation   Left rotation    (Blank rows = not tested)  LOWER EXTREMITY ROM:     Active  Right eval Left eval  Hip flexion 90d 90d  Hip extension    Hip abduction    Hip adduction    Hip internal rotation    Hip external rotation    Knee flexion    Knee extension    Ankle  dorsiflexion    Ankle plantarflexion    Ankle inversion    Ankle eversion     (Blank rows = not tested)  LOWER EXTREMITY MMT:  deferred due to pain  MMT Right eval Left eval  Hip flexion    Hip extension    Hip abduction    Hip adduction    Hip internal rotation    Hip external rotation    Knee flexion    Knee extension    Ankle dorsiflexion    Ankle plantarflexion    Ankle inversion    Ankle eversion     (Blank rows = not tested)  LUMBAR SPECIAL TESTS:  Straight leg raise test: Negative and Slump test: Negative  FUNCTIONAL TESTS:  Deferred due to 1 time visit  GAIT: Distance walked: 61ft x2 Assistive device utilized: Single point cane Level of assistance: Complete Independence Comments: slow antalgic gait  TODAY'S TREATMENT:                                                                                                                              DATE: Eval and HEP   PATIENT EDUCATION:  Education details: Discussed eval findings, rehab rationale and POC and patient is in agreement  Person educated: Patient Education method: Explanation Education comprehension: verbalized understanding  HOME EXERCISE PROGRAM: Access Code: [email or web address] URL: [email or web address] Date: 01/22/2023 Prepared by: Sharlynn Oliphant  Exercises - Clamshell  - 2 x daily -  5 x weekly - 1 sets - 15 reps - Curl Up with Arms Crossed  - 2 x daily - 5 x weekly - 1 sets - 15 reps - Supine Piriformis Stretch with Foot on Ground  - 2 x daily - 5 x weekly - 1 sets - 2 reps - 30s hold - Modified Thomas Stretch  - 2 x daily - 5 x weekly - 1 sets - 2 reps - 30s hold  ASSESSMENT:  CLINICAL IMPRESSION: Patient is a 72 y.o. female who was seen today for physical therapy evaluation and treatment for R low back pain and resultant sciatica.  Patient requesting HEP only due to financial reasons.  Findings include tight R piriformis and resultant RLE radicular symptoms.  Hip mobility functional  on R but painful with piriformis stretching.  Patient TTP in R piriformis with palpable taught bands detected.  LE strength and lumbar mobility testing deferred due to pain levels and request to establish a HEP.  Trigger point release to piriformis decreased symptoms and patient agreeable to return after first of the month for f/u and possible TPDN intervention.  OBJECTIVE IMPAIRMENTS: Abnormal gait, cardiopulmonary status limiting activity, decreased activity tolerance, decreased coordination, decreased endurance, decreased knowledge of condition, decreased mobility, difficulty walking, decreased ROM, decreased strength, increased muscle spasms, postural dysfunction, obesity, and pain.   ACTIVITY LIMITATIONS: carrying, lifting, bending, sitting, standing, squatting, sleeping, stairs, transfers, and bed mobility  PERSONAL FACTORS: Age, Fitness, Past/current experiences, and 1-2 comorbidities: DM and COPD  are also affecting patient's functional outcome.   REHAB POTENTIAL: Fair based on chronicity and co-morbidities  CLINICAL DECISION MAKING: Stable/uncomplicated  EVALUATION COMPLEXITY: Moderate   GOALS: Goals reviewed with patient? Yes  SHORT TERM GOALS=LONG TERM GOALS: Target date: 02/19/2023    Patient to demonstrate independence in HEP  Baseline:[id number] Goal status: INITIAL  2.  Decrease pain to 6/10 at worst Baseline: 8/10 Goal status: INITIAL  3.  Decrease R piriformis tenderness to minimal Baseline: Moderate tenderness to palpation Goal status: INITIAL    PLAN:  PT FREQUENCY: one time visit  PT DURATION: 4 weeks  PLANNED INTERVENTIONS: Therapeutic exercises, Therapeutic activity, Gait training, Self Care, Dry Needling, Manual therapy, and Re-evaluation.  PLAN FOR NEXT SESSION: HEP review and update, manual technique sand TPDN as needed, core strength and stretch, aerobic work.   Lanice Shirts, PT 01/22/2023, 4:00 PM

## 2023-01-22 ENCOUNTER — Other Ambulatory Visit: Payer: Self-pay

## 2023-01-22 ENCOUNTER — Ambulatory Visit: Payer: PPO | Attending: Family Medicine

## 2023-01-22 DIAGNOSIS — M5440 Lumbago with sciatica, unspecified side: Secondary | ICD-10-CM | POA: Diagnosis not present

## 2023-01-22 DIAGNOSIS — M5459 Other low back pain: Secondary | ICD-10-CM | POA: Diagnosis not present

## 2023-01-22 DIAGNOSIS — M6281 Muscle weakness (generalized): Secondary | ICD-10-CM | POA: Insufficient documentation

## 2023-01-23 ENCOUNTER — Other Ambulatory Visit: Payer: Self-pay | Admitting: Family Medicine

## 2023-01-23 DIAGNOSIS — Z1231 Encounter for screening mammogram for malignant neoplasm of breast: Secondary | ICD-10-CM

## 2023-01-25 ENCOUNTER — Encounter: Payer: Self-pay | Admitting: Family Medicine

## 2023-01-25 DIAGNOSIS — E669 Obesity, unspecified: Secondary | ICD-10-CM

## 2023-01-26 ENCOUNTER — Other Ambulatory Visit: Payer: Self-pay | Admitting: Family Medicine

## 2023-01-26 DIAGNOSIS — E669 Obesity, unspecified: Secondary | ICD-10-CM

## 2023-01-26 MED ORDER — TIRZEPATIDE 2.5 MG/0.5ML ~~LOC~~ SOAJ: 2.5000 mg | SUBCUTANEOUS | 0 refills | Status: DC

## 2023-01-29 ENCOUNTER — Encounter: Payer: Self-pay | Admitting: Family Medicine

## 2023-01-29 DIAGNOSIS — M48061 Spinal stenosis, lumbar region without neurogenic claudication: Secondary | ICD-10-CM

## 2023-01-29 DIAGNOSIS — M47896 Other spondylosis, lumbar region: Secondary | ICD-10-CM

## 2023-01-29 DIAGNOSIS — G894 Chronic pain syndrome: Secondary | ICD-10-CM

## 2023-01-29 DIAGNOSIS — M160 Bilateral primary osteoarthritis of hip: Secondary | ICD-10-CM

## 2023-01-29 DIAGNOSIS — G2581 Restless legs syndrome: Secondary | ICD-10-CM

## 2023-01-29 MED ORDER — HYDROCODONE-ACETAMINOPHEN 10-325 MG PO TABS: 1.0000 | ORAL_TABLET | Freq: Four times a day (QID) | ORAL | 0 refills | Status: DC | PRN

## 2023-01-29 MED ORDER — MORPHINE SULFATE ER 15 MG PO TBCR: 15.0000 mg | EXTENDED_RELEASE_TABLET | Freq: Every day | ORAL | 0 refills | Status: DC

## 2023-02-02 ENCOUNTER — Encounter: Payer: Self-pay | Admitting: Family Medicine

## 2023-02-02 ENCOUNTER — Telehealth: Payer: Self-pay

## 2023-02-02 ENCOUNTER — Other Ambulatory Visit (HOSPITAL_COMMUNITY): Payer: Self-pay

## 2023-02-02 NOTE — Telephone Encounter (Signed)
A Prior Authorization was initiated for this patients MOUNJARO through CoverMyMeds.   Key: [id number]

## 2023-02-05 ENCOUNTER — Other Ambulatory Visit: Payer: Self-pay | Admitting: Family Medicine

## 2023-02-05 DIAGNOSIS — K929 Disease of digestive system, unspecified: Secondary | ICD-10-CM

## 2023-02-06 ENCOUNTER — Encounter: Payer: Self-pay | Admitting: Family Medicine

## 2023-02-06 DIAGNOSIS — E119 Type 2 diabetes mellitus without complications: Secondary | ICD-10-CM

## 2023-02-06 DIAGNOSIS — F3181 Bipolar II disorder: Secondary | ICD-10-CM | POA: Diagnosis not present

## 2023-02-06 DIAGNOSIS — F4312 Post-traumatic stress disorder, chronic: Secondary | ICD-10-CM | POA: Diagnosis not present

## 2023-02-07 ENCOUNTER — Ambulatory Visit: Payer: PPO

## 2023-02-07 MED ORDER — SEMAGLUTIDE(0.25 OR 0.5MG/DOS) 2 MG/1.5ML ~~LOC~~ SOPN: 0.2500 mg | PEN_INJECTOR | SUBCUTANEOUS | 0 refills | Status: DC

## 2023-02-08 ENCOUNTER — Other Ambulatory Visit (HOSPITAL_COMMUNITY): Payer: Self-pay

## 2023-02-08 NOTE — Telephone Encounter (Signed)
Prior Auth for patients medication MOUNJARO denied by [facility] [employer] via CoverMyMeds.   Reason:    CoverMyMeds Key: [id number]

## 2023-02-12 ENCOUNTER — Encounter: Payer: Self-pay | Admitting: Family Medicine

## 2023-02-12 ENCOUNTER — Telehealth: Payer: Self-pay

## 2023-02-12 NOTE — Telephone Encounter (Signed)
A Prior Authorization was initiated for this patients OZEMPIC through CoverMyMeds.   Key: [place]

## 2023-02-14 NOTE — Telephone Encounter (Signed)
Prior Auth for patients medication OZEMPIC 0.25/0.5MG  [employer]SE PENS denied by [employer] via CoverMyMeds.   Reason: criteria not met: Type 2 diabetes with evidence of A1C greater than or equal to 6.5%  CoverMyMeds Key: [id number]

## 2023-02-14 NOTE — Telephone Encounter (Signed)
Reviewed

## 2023-02-22 ENCOUNTER — Other Ambulatory Visit (HOSPITAL_COMMUNITY): Payer: Self-pay

## 2023-02-23 ENCOUNTER — Telehealth: Payer: Self-pay | Admitting: *Deleted

## 2023-02-23 DIAGNOSIS — Z87891 Personal history of nicotine dependence: Secondary | ICD-10-CM

## 2023-02-23 DIAGNOSIS — R911 Solitary pulmonary nodule: Secondary | ICD-10-CM

## 2023-02-23 NOTE — Telephone Encounter (Signed)
Called and spoke with pt and advised that there was a lung nodule seen on her scan that we would like to take a look at again in 6 months with a repeat CT scan. Pt verbalized understanding and is aware we will call her closer to 6 months to schedule follow up scan. Results/ plans faxed to PCP. Order placed for 6 mth nodule f/u CT.

## 2023-03-01 ENCOUNTER — Ambulatory Visit (INDEPENDENT_AMBULATORY_CARE_PROVIDER_SITE_OTHER): Payer: PPO | Admitting: Family Medicine

## 2023-03-01 ENCOUNTER — Encounter: Payer: Self-pay | Admitting: Family Medicine

## 2023-03-01 VITALS — BP 153/68 | HR 62 | Ht 64.0 in | Wt 237.1 lb

## 2023-03-01 DIAGNOSIS — M48061 Spinal stenosis, lumbar region without neurogenic claudication: Secondary | ICD-10-CM

## 2023-03-01 DIAGNOSIS — E039 Hypothyroidism, unspecified: Secondary | ICD-10-CM

## 2023-03-01 DIAGNOSIS — M47896 Other spondylosis, lumbar region: Secondary | ICD-10-CM

## 2023-03-01 DIAGNOSIS — G2581 Restless legs syndrome: Secondary | ICD-10-CM | POA: Diagnosis not present

## 2023-03-01 DIAGNOSIS — M797 Fibromyalgia: Secondary | ICD-10-CM

## 2023-03-01 DIAGNOSIS — K529 Noninfective gastroenteritis and colitis, unspecified: Secondary | ICD-10-CM

## 2023-03-01 DIAGNOSIS — G5701 Lesion of sciatic nerve, right lower limb: Secondary | ICD-10-CM | POA: Diagnosis not present

## 2023-03-01 DIAGNOSIS — G894 Chronic pain syndrome: Secondary | ICD-10-CM | POA: Diagnosis not present

## 2023-03-01 DIAGNOSIS — E119 Type 2 diabetes mellitus without complications: Secondary | ICD-10-CM | POA: Diagnosis not present

## 2023-03-01 DIAGNOSIS — K580 Irritable bowel syndrome with diarrhea: Secondary | ICD-10-CM | POA: Diagnosis not present

## 2023-03-01 DIAGNOSIS — M160 Bilateral primary osteoarthritis of hip: Secondary | ICD-10-CM

## 2023-03-01 DIAGNOSIS — R918 Other nonspecific abnormal finding of lung field: Secondary | ICD-10-CM

## 2023-03-01 LAB — POCT GLYCOSYLATED HEMOGLOBIN (HGB A1C): HbA1c, POC (controlled diabetic range): 6.2 % (ref 0.0–7.0)

## 2023-03-01 MED ORDER — MORPHINE SULFATE ER 15 MG PO TBCR
15.0000 mg | EXTENDED_RELEASE_TABLET | Freq: Every day | ORAL | 0 refills | Status: DC
Start: 2023-03-01 — End: 2023-03-28

## 2023-03-01 MED ORDER — TIZANIDINE HCL 2 MG PO TABS: ORAL_TABLET | ORAL | 3 refills | Status: DC

## 2023-03-01 MED ORDER — DICYCLOMINE HCL 20 MG PO TABS
ORAL_TABLET | ORAL | 0 refills | Status: DC
Start: 2023-03-01 — End: 2023-03-28

## 2023-03-01 MED ORDER — HYDROCODONE-ACETAMINOPHEN 10-325 MG PO TABS
1.0000 | ORAL_TABLET | Freq: Four times a day (QID) | ORAL | 0 refills | Status: DC | PRN
Start: 2023-03-01 — End: 2023-03-28

## 2023-03-01 NOTE — Patient Instructions (Addendum)
Your blood pressure is up today.  Please come by for a Blood pressure check next week with the nurses.    We are checking you thyroid today   Your diabetes control remain good.  You have lost 7 pounds since I saw you last!

## 2023-03-02 ENCOUNTER — Encounter: Payer: Self-pay | Admitting: Family Medicine

## 2023-03-02 ENCOUNTER — Telehealth: Payer: Self-pay | Admitting: Family Medicine

## 2023-03-02 DIAGNOSIS — E039 Hypothyroidism, unspecified: Secondary | ICD-10-CM

## 2023-03-02 DIAGNOSIS — G5701 Lesion of sciatic nerve, right lower limb: Secondary | ICD-10-CM | POA: Insufficient documentation

## 2023-03-02 DIAGNOSIS — R911 Solitary pulmonary nodule: Secondary | ICD-10-CM | POA: Insufficient documentation

## 2023-03-02 DIAGNOSIS — R918 Other nonspecific abnormal finding of lung field: Secondary | ICD-10-CM | POA: Insufficient documentation

## 2023-03-02 HISTORY — DX: Lesion of sciatic nerve, right lower limb: G57.01

## 2023-03-02 HISTORY — DX: Other nonspecific abnormal finding of lung field: R91.8

## 2023-03-02 LAB — TSH: TSH: 0.223 u[IU]/mL — ABNORMAL LOW (ref 0.450–4.500)

## 2023-03-02 LAB — CMP14+EGFR
ALT: 18 IU/L (ref 0–32)
AST: 17 IU/L (ref 0–40)
Albumin/Globulin Ratio: 2 (ref 1.2–2.2)
Albumin: 4.7 g/dL (ref 3.8–4.8)
Alkaline Phosphatase: 73 IU/L (ref 44–121)
BUN/Creatinine Ratio: 14 (ref 12–28)
BUN: 10 mg/dL (ref 8–27)
Bilirubin Total: 0.5 mg/dL (ref 0.0–1.2)
CO2: 25 mmol/L (ref 20–29)
Calcium: 10.1 mg/dL (ref 8.7–10.3)
Chloride: 97 mmol/L (ref 96–106)
Creatinine, Ser: 0.73 mg/dL (ref 0.57–1.00)
Globulin, Total: 2.3 g/dL (ref 1.5–4.5)
Glucose: 131 mg/dL — ABNORMAL HIGH (ref 70–99)
Potassium: 4.5 mmol/L (ref 3.5–5.2)
Sodium: 136 mmol/L (ref 134–144)
Total Protein: 7 g/dL (ref 6.0–8.5)
eGFR: 87 mL/min/{1.73_m2} (ref >59)

## 2023-03-02 LAB — MICROALBUMIN / CREATININE URINE RATIO
Creatinine, Urine: 48.2 mg/dL
Microalb/Creat Ratio: <6 mg/g creat (ref 0–29)
Microalbumin, Urine: <3 ug/mL

## 2023-03-02 NOTE — Assessment & Plan Note (Signed)
TSH 0.223 Low Decline from normal range last year. On levothyroxine 125 microgram daily.  Plan Repeat TSH next week to see if remains decreased.

## 2023-03-02 NOTE — Assessment & Plan Note (Deleted)
Worsening of chronic pain Right lower back into right lower leg, chronic but recent worsening without trauma Recent Physical Therapy evaluation noted:  tight R piriformis and resultant RLE radicular symptoms.  Hip mobility functional on R but painful with piriformis stretching.  Patient TTP in R piriformis with palpable taught bands detected.  LE strength and lumbar mobility testing deferred due to pain levels and request to establish a HEP.  Trigger point release to piriformis decreased symptoms and patient agreeable to return after first of the month for f/u and possible TPDN intervention.   OBJECTIVE IMPAIRMENTS: Abnormal gait, cardiopulmonary status limiting activity, decreased activity tolerance, decreased coordination, decreased endurance, decreased knowledge of condition, decreased mobility, difficulty walking, decreased ROM, decreased strength, increased muscle spasms, postural dysfunction, obesity, and pain.    ACTIVITY LIMITATIONS: carrying, lifting, bending, sitting, standing, squatting, sleeping, stairs, transfers, and bed mobility   PERSONAL FACTORS: Age, Fitness, Past/current experiences, and 1-2 comorbidities: DM and COPD  are also affecting patient's functional outcome.    REHAB POTENTIAL: Fair based on chronicity and co-morbidities

## 2023-03-02 NOTE — Assessment & Plan Note (Signed)
Established problem Lab Results  Component Value Date   HGBA1C 6.2 03/01/2023  Basic Metabolic Panel:    Component Value Date/Time   NA 136 03/01/2023 1736   K 4.5 03/01/2023 1736   CL 97 03/01/2023 1736   CO2 25 03/01/2023 1736   BUN 10 03/01/2023 1736   CREATININE 0.73 03/01/2023 1736   CREATININE 0.78 06/17/2015 1208   GLUCOSE 131 (H) 03/01/2023 1736   GLUCOSE 130 (H) 04/06/2022 1934   CALCIUM 10.1 03/01/2023 1736     Well Controlled and is at goal of A1c < 7.0. No signs of complications, medication side effects, or red flags. Continue current medications and other regiments. Awit UAC ratio

## 2023-03-02 NOTE — Telephone Encounter (Signed)
I asked [name] to stop by the lab when she comes in at 2 pm on 5/2 for a blood pressure recheck.  She will need a repeat TSH. Future order placed.

## 2023-03-02 NOTE — Assessment & Plan Note (Signed)
Established problem At baseline of mixed diarrhea and constipation.  Currently in a diarrheal phase. Bentyl is used twice a wekk and well as the Compazine about twice a week for nausea.  Will continue current medications

## 2023-03-02 NOTE — Progress Notes (Signed)
[name] is alone Sources of clinical information for visit is/are patient. Nursing assessment for this office visit was reviewed with the patient for accuracy and revision.     Previous Report(s) Reviewed: Physical Therapy Eval 01/22/23     03/01/2023    1:41 PM  Depression screen PHQ 2/9  Decreased Interest 2  Down, Depressed, Hopeless 2  PHQ - 2 Score 4  Altered sleeping 3  Tired, decreased energy 1  Change in appetite 2  Feeling bad or failure about yourself  2  Trouble concentrating 3  Moving slowly or fidgety/restless 0  Suicidal thoughts 0  PHQ-9 Score 15  Difficult doing work/chores Very difficult   Flowsheet Row Office Visit from 03/01/2023 in [facility] Office Visit from 11/30/2022 in [facility] Office Visit from 08/24/2022 in [facility]  Thoughts that you would be better off dead, or of hurting yourself in some way Not at all Not at all Not at all  PHQ-9 Total Score 15 12 13           03/01/2023    1:41 PM 11/30/2022   10:12 AM 08/24/2022    1:41 PM 05/18/2022   11:01 AM 04/19/2022   11:25 AM  Fall Risk   Falls in the past year? 0 0 0 0 0  Number falls in past yr: 0 0 0 0 0  Injury with Fall? 0 0 0 0 0       03/01/2023    1:41 PM 11/30/2022   10:11 AM 08/24/2022    1:40 PM  PHQ9 SCORE ONLY  PHQ-9 Total Score 15 12 13     There are no preventive care reminders to display for this patient.  Health Maintenance Due  Topic Date Due   FOOT EXAM  09/16/2021   Medicare Annual Wellness (AWV)  01/12/2023   Diabetic kidney evaluation - Urine ACR  03/03/2023      History/P.E. limitations: Patient is in discomfort  There are no preventive care reminders to display for this patient.  Diabetes Health Maintenance Due  Topic Date Due   FOOT EXAM  09/16/2021   OPHTHALMOLOGY EXAM  07/21/2023   HEMOGLOBIN A1C  08/31/2023    Health Maintenance Due  Topic Date Due   FOOT EXAM  09/16/2021    Medicare Annual Wellness (AWV)  01/12/2023   Diabetic kidney evaluation - Urine ACR  03/03/2023     Chief Complaint  Patient presents with   Back Pain   Diabetes     --------------------------------------------------------------------------------------------------------------------------------------------- Visit Problem List with A/P  Pyriformis syndrome, right Worsening of chronic pain Right lower back into right lower leg, chronic but recent worsening without trauma Recent Physical Therapy evaluation noted:  tight R piriformis and resultant RLE radicular symptoms.  Hip mobility functional on R but painful with piriformis stretching.  Patient TTP in R piriformis with palpable taught bands detected.  Trigger point release to piriformis decreased symptoms and patient agreeable to return after first of the month for f/u and possible trigger point dry needling..   OBJECTIVE IMPAIRMENTS: Abnormal gait, cardiopulmonary status limiting activity, decreased activity tolerance, decreased coordination, decreased endurance, decreased knowledge of condition, decreased mobility, difficulty walking, decreased ROM, decreased strength, increased muscle spasms, postural dysfunction, obesity, and pain.    ACTIVITY LIMITATIONS: carrying, lifting, bending, sitting, standing, squatting, sleeping, stairs, transfers, and bed mobility   PERSONAL FACTORS: Age, Fitness, Past/current experiences, and 1-2 comorbidities: DM and COPD  are also affecting  patient's functional outcome.    REHAB POTENTIAL: Fair based on chronicity and co-morbidities  [name] declines referral back to Physical Therapy because she is finding the Copay onerous at this time.  Will refill tizanidine as this seems to help patient when pain is particularly bad.  She continues to use her Gabriella Contin once a day.  Will stop her reduction in HC-APAP tabs dispensed.  May refill early this next month.   Pulmonary nodules/lesions, multiple Multiple  pulmonary nodules seen on recent LD Chest CT screening.  Repeat scheudled per radiology recommendation in mid-September this year.    Irritable bowel syndrome Established problem At baseline of mixed diarrhea and constipation.  Currently in a diarrheal phase. Bentyl is used twice a wekk and well as the Compazine about twice a week for nausea.  Will continue current medications   Hypothyroidism TSH 0.223 Low Decline from normal range last year. On levothyroxine 125 microgram daily.  Plan Repeat TSH next week to see if remains decreased.   Type 2 diabetes mellitus without complications [facility]) Established problem Lab Results  Component Value Date   HGBA1C 6.2 03/01/2023  Basic Metabolic Panel:    Component Value Date/Time   NA 136 03/01/2023 1736   K 4.5 03/01/2023 1736   CL 97 03/01/2023 1736   CO2 25 03/01/2023 1736   BUN 10 03/01/2023 1736   CREATININE 0.73 03/01/2023 1736   CREATININE 0.78 06/17/2015 1208   GLUCOSE 131 (H) 03/01/2023 1736   GLUCOSE 130 (H) 04/06/2022 1934   CALCIUM 10.1 03/01/2023 1736     Well Controlled and is at goal of A1c < 7.0. No signs of complications, medication side effects, or red flags. Continue current medications and other regiments. Awit UAC ratio

## 2023-03-02 NOTE — Assessment & Plan Note (Signed)
Worsening of chronic pain Right lower back into right lower leg, chronic but recent worsening without trauma Recent Physical Therapy evaluation noted:  tight R piriformis and resultant RLE radicular symptoms.  Hip mobility functional on R but painful with piriformis stretching.  Patient TTP in R piriformis with palpable taught bands detected.  Trigger point release to piriformis decreased symptoms and patient agreeable to return after first of the month for f/u and possible trigger point dry needling..   OBJECTIVE IMPAIRMENTS: Abnormal gait, cardiopulmonary status limiting activity, decreased activity tolerance, decreased coordination, decreased endurance, decreased knowledge of condition, decreased mobility, difficulty walking, decreased ROM, decreased strength, increased muscle spasms, postural dysfunction, obesity, and pain.    ACTIVITY LIMITATIONS: carrying, lifting, bending, sitting, standing, squatting, sleeping, stairs, transfers, and bed mobility   PERSONAL FACTORS: Age, Fitness, Past/current experiences, and 1-2 comorbidities: DM and COPD  are also affecting patient's functional outcome.    REHAB POTENTIAL: Fair based on chronicity and co-morbidities  Ms Sliwinski declines referral back to Physical Therapy because she is finding the Copay onerous at this time.  Will refill tizanidine as this seems to help patient when pain is particularly bad.  She continues to use her MS Contin once a day.  Will stop her reduction in HC-APAP tabs dispensed.  May refill early this next month.

## 2023-03-02 NOTE — Assessment & Plan Note (Signed)
Multiple pulmonary nodules seen on recent LD Chest CT screening.  Repeat scheudled per radiology recommendation in mid-September this year.

## 2023-03-08 ENCOUNTER — Ambulatory Visit (INDEPENDENT_AMBULATORY_CARE_PROVIDER_SITE_OTHER): Payer: PPO

## 2023-03-08 VITALS — BP 134/78 | HR 59

## 2023-03-08 DIAGNOSIS — E039 Hypothyroidism, unspecified: Secondary | ICD-10-CM

## 2023-03-08 DIAGNOSIS — Z0130 Encounter for examination of blood pressure without abnormal findings: Secondary | ICD-10-CM

## 2023-03-08 NOTE — Progress Notes (Signed)
Patient presents to nurse clinic for BP check. Last BP on 03/01/23 was 153/68.   Initial BP today was 142/78, repeat 134/78.   Patient reports taking all medications as prescribed. Patient assisted to lab for TSH check.   Will forward note to PCP.   Veronda Prude, RN

## 2023-03-09 ENCOUNTER — Telehealth: Payer: Self-pay | Admitting: Family Medicine

## 2023-03-09 DIAGNOSIS — E039 Hypothyroidism, unspecified: Secondary | ICD-10-CM

## 2023-03-09 LAB — TSH: TSH: 0.148 u[IU]/mL — ABNORMAL LOW (ref 0.450–4.500)

## 2023-03-09 MED ORDER — LEVOTHYROXINE SODIUM 88 MCG PO TABS
88.0000 ug | ORAL_TABLET | ORAL | 0 refills | Status: DC
Start: 2023-03-09 — End: 2023-04-16

## 2023-03-09 NOTE — Telephone Encounter (Signed)
Scheduled patient lab visit on 06/3rd @2pm 

## 2023-03-09 NOTE — Telephone Encounter (Signed)
I discussed the persistent low TSH with [name].  She agreed to decrease her levothyroxine to 88 microgram tablet, one tablet daily and stop the levothyroxine 125 microgram tablet. She will return in around a month for recheck of her TSH.   Levothyroxine 88 microgram Prescription  sent to pharmacy

## 2023-03-22 ENCOUNTER — Ambulatory Visit: Payer: PPO

## 2023-03-26 ENCOUNTER — Other Ambulatory Visit: Payer: Self-pay | Admitting: Family Medicine

## 2023-03-26 ENCOUNTER — Ambulatory Visit (INDEPENDENT_AMBULATORY_CARE_PROVIDER_SITE_OTHER): Payer: PPO

## 2023-03-26 VITALS — Ht 64.0 in | Wt 237.0 lb

## 2023-03-26 DIAGNOSIS — Z0000 Encounter for general adult medical examination without abnormal findings: Secondary | ICD-10-CM | POA: Diagnosis not present

## 2023-03-26 DIAGNOSIS — E039 Hypothyroidism, unspecified: Secondary | ICD-10-CM

## 2023-03-26 NOTE — Progress Notes (Cosign Needed Addendum)
Subjective:   [name] is a 72 y.o. female who presents for Medicare Annual (Subsequent) preventive examination.  I connected with  Volney Presser on 03/26/23 by a audio enabled telemedicine application and verified that I am speaking with the correct person using two identifiers.  Patient Location: Home  Provider Location: Home Office  I discussed the limitations of evaluation and management by telemedicine. The patient expressed understanding and agreed to proceed.  Review of Systems     Cardiac Risk Factors include: advanced age (>48men, >65 women);dyslipidemia;hypertension;smoking/ tobacco exposure;sedentary lifestyle;obesity (BMI >30kg/m2)     Objective:    Today's Vitals   03/26/23 1437  Weight: 237 lb (107.5 kg)  Height: 5\' 4"  (1.626 m)   Body mass index is 40.68 kg/m.     03/26/2023    2:20 PM 03/01/2023    1:40 PM 01/22/2023    2:52 PM 08/24/2022    1:40 PM 05/18/2022   11:00 AM 04/19/2022   11:24 AM 04/06/2022    7:15 PM  Advanced Directives  Does Patient Have a Medical Advance Directive? No No No No No No No  Does patient want to make changes to medical advance directive?      No - Guardian declined   Would patient like information on creating a medical advance directive? No - Patient declined No - Patient declined No - Patient declined No - Patient declined No - Patient declined No - Patient declined     Current Medications (verified) Outpatient Encounter Medications as of 03/26/2023  Medication Sig   atorvastatin (LIPITOR) 20 MG tablet TAKE 1 TABLET BY MOUTH EVERY DAY   buPROPion ER (WELLBUTRIN SR) 100 MG 12 hr tablet Take 100 mg by mouth 2 (two) times daily.   Calcium Carb-Cholecalciferol (CALCIUM-VITAMIN D) 600-400 MG-UNIT TABS Take 1 tablet by mouth daily.   Carboxymethylcellulose Sodium (THERATEARS) 0.25 % SOLN Place 1 drop into both eyes daily as needed (dry eyes).   citalopram (CELEXA) 20 MG tablet Take 20 mg by mouth at bedtime.   Continuous  Blood Gluc Sensor (FREESTYLE LIBRE 14 DAY SENSOR) MISC APPLY EVERY 14 DAYS   dicyclomine (BENTYL) 20 MG tablet TAKE 1 TABLET BY MOUTH EVERY 8 (EIGHT) HOURS AS NEEDED FOR SPASMS.   fexofenadine (ALLEGRA) 180 MG tablet Take 180 mg by mouth daily.   fluticasone (FLONASE) 50 MCG/ACT nasal spray Place 2 sprays into both nostrils daily.   hydrochlorothiazide (HYDRODIURIL) 25 MG tablet TAKE 1 TABLET BY MOUTH EVERY DAY   HYDROcodone-acetaminophen (NORCO) 10-325 MG tablet Take 1 tablet by mouth every 6 (six) hours as needed for moderate pain.   hydrOXYzine (VISTARIL) 50 MG capsule Take 100 mg by mouth at bedtime.   KLOR-CON M10 10 MEQ tablet TAKE 4 TABLETS (40 MEQ TOTAL) BY MOUTH AT BEDTIME.   levothyroxine (SYNTHROID) 88 MCG tablet Take 1 tablet (88 mcg total) by mouth every morning. 30 minutes before food   loperamide (IMODIUM) 2 MG capsule Take 1 capsule (2 mg total) by mouth as needed for diarrhea or loose stools.   LORazepam (ATIVAN) 0.5 MG tablet Take 0.5 mg by mouth 3 (three) times daily as needed.   metoprolol tartrate (LOPRESSOR) 50 MG tablet TAKE 1 TABLET BY MOUTH TWICE A DAY   morphine (MS CONTIN) 15 MG 12 hr tablet Take 1 tablet (15 mg total) by mouth at bedtime.   Multiple Vitamins-Minerals (MULTIVITAMIN WITH MINERALS) tablet Take 1 tablet by mouth daily.   naloxone (NARCAN) nasal spray 4 mg/0.1 mL  PLEASE SEE ATTACHED FOR DETAILED DIRECTIONS   omeprazole (PRILOSEC) 40 MG capsule TAKE 1 CAPSULE BY MOUTH EVERY DAY   polyethylene glycol powder (GLYCOLAX/MIRALAX) 17 GM/SCOOP powder Take 17 g by mouth as needed for mild constipation.   prazosin (MINIPRESS) 2 MG capsule Take 2 mg by mouth at bedtime.   Probiotic Product (PROBIOTIC DAILY PO) Take 1 capsule by mouth daily.   prochlorperazine (COMPAZINE) 10 MG tablet TAKE 1 TABLET BY MOUTH 2 (TWO) TIMES DAILY AS NEEDED FOR NAUSEA OR VOMITING.   ramipril (ALTACE) 10 MG capsule TAKE 1 CAPSULE BY MOUTH EVERY DAY   senna-docusate (SENOKOT-S) 8.6-50 MG  tablet Take 2 tablets by mouth at bedtime. For AFTER surgery, do not take if having diarrhea   simethicone (MYLICON) 80 MG chewable tablet Chew 160 mg by mouth daily as needed (gas).   solifenacin (VESICARE) 5 MG tablet TAKE 1 TABLET (5 MG TOTAL) BY MOUTH DAILY.   tiZANidine (ZANAFLEX) 2 MG tablet TAKE 1 TABLET BY MOUTH EVERYDAY AT BEDTIME   traZODone (DESYREL) 50 MG tablet Take 200 mg by mouth at bedtime.   vitamin B-12 (CYANOCOBALAMIN) 1000 MCG tablet Take 1,000 mcg by mouth daily.   No facility-administered encounter medications on file as of 03/26/2023.    Allergies (verified) Diclofenac-misoprostol, Aripiprazole, Emsam [selegiline], Estradiol, Ibuprofen, Naproxen, Neurontin [gabapentin], Paroxetine, Prednisone, and Tape   History: Past Medical History:  Diagnosis Date   Adrenal adenoma, left 12/02/2020   CT AP 11/16/20:  left adrenal mass measuring approximately 2.7 cm. This is minimally increased in size from prior study and is consistent with a benign adrenal adenoma.    Adrenal nodule (HCC) 04/08/2011   04/08/11 Abdominal CT showed Left adrenal nodule which is tachnically indeterminate but most likely an adenoma.  It is 1.7 cm and 42 HU on portal venous phase image. 31 HU on delayed images.   01/09/2012 Abdominal CT w/ & w/o CM: Stable benign left adrenal adenoma. No further specific follow-up is needed for this finding.     Allergic rhinitis 01/03/2007   Qualifier: Diagnosis of  By: Haydee Salter     Allergy    Anemia    hx of  in 20's   Anxiety    Bipolar disorder (HCC)    Carpal tunnel syndrome 02/14/2007   S/P carpel tunnel release bilaterally.     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 03/28/2018 03/28/2018   COPD WITHOUT EXACERBATION 01/03/2007   Qualifier: Diagnosis of  By: Haydee Salter  emphysema   Diabetes mellitus without complication (HCC)    A1C  6.5 as of 05-2019- diet changes, no meds currently    DISC WITH RADICULOPATHY 01/03/2007   Qualifier: Diagnosis of  By: Haydee Salter     Emphysema of lung (HCC)    Esophageal reflux 01/03/2007   Centricity Description: GASTROESOPHAGEAL REFLUX, NO ESOPHAGITIS Qualifier: Diagnosis of  By: Haydee Salter   Centricity Description: GERD Qualifier: Diagnosis of  By: Lelon Perla MD, [place]     External hemorrhoids s/p hemorrhoidectomy 03/28/2018 03/28/2018   Family history of breast cancer    Family history of cancer of female genital organ    Family history of colon cancer    Family history of gene mutation    BRIP1 gene mutation (c.2010dup) in daughter   Family history of lung cancer    Fibromyalgia    Fracture of bone spur of inferior portion  of calcaneus 10/06/2018   Functional gastrointestinal disorder 06/18/2008   Functional Gastrointestinal Disorder with frequent eructation and bloating sensation with acute flares.  Responsive to Compazine and Bentyl.  Takes a few days to calm down.     GERD without esophagitis 01/03/2007   Centricity Description: GASTROESOPHAGEAL REFLUX, NO ESOPHAGITIS Qualifier: Diagnosis of  By: Haydee Salter   Centricity Description: GERD Qualifier: Diagnosis of  By: Lelon Perla MD, [place]     Hearing impairment 04/28/2021   Heel spur, fracture, left 04/17/2018   HEMORRHOIDS, NOS 01/03/2007   Qualifier: Diagnosis of  By: Haydee Salter     Hepatic steatosis 04/12/2011   Herpes simplex labialis 05/11/2011   Hip osteoarthritis 05/23/2007   MRI Pelvis (03/13/07) Bilateral osteoarthritis of the hips, left greater than right.  The  patient has a slightly more prominent hip effusion on the left than  the right.  No other significant abnormality.     History of MRSA infection 04/28/2011   HYPERCHOLESTEROLEMIA 12/24/2008   Hypertension    HYPERTENSION, BENIGN SYSTEMIC 01/03/2007   Qualifier: Diagnosis of  By: Haydee Salter     HYPERTRIGLYCERIDEMIA 11/11/2007   Qualifier:  Diagnosis of  By: McDiarmid MD, Todd     Hypokalemia 12/12/2017   Hypothyroidism 02/15/2007   Qualifier: Diagnosis of  By: McDiarmid MD, Debara Pickett, BORDERLINE 02/15/2007   Insomnia 03/03/2021   Intra-abdominal adhesions    Irritable bowel syndrome 01/03/2007   Qualifier: Diagnosis of  By: Haydee Salter     Knee pain, acute 12/01/2022   LACTOSE INTOLERANCE 03/10/2008   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   Leukoplakia of oral cavity 09/20/2022   Major depressive disorder, recurrent episode (HCC) 01/03/2007   Qualifier: Diagnosis of  By: Haydee Salter     Medication management 12/14/2016   Managing topiramate for weight loss starting 12/14/16   Memory difficulties 03/04/2021   Monoallelic mutation of BRIP1 gene 01/17/2021   c.2010dup (p.[id number]*)   No impairment of memory 04/29/2021   Obesity (BMI 30.0-34.9) 09/30/2007   Has lost 55 lbs since easter. Goal of <190 by 08/05/12.      Obesity, Class III, BMI 40-49.9 (morbid obesity) (HCC) 09/30/2007        Osteoarthritis of spine 01/03/2007   MR I Lumbar (08/03): GeneralDDD; L2-3 Large  HNP.  Mod pos  hnp - 12/14/2005, smhnpw/irritL4root - 12/14/2005     OSTEOARTHRITIS, HANDS, BILATERAL 06/18/2008   Qualifier: Diagnosis of  By: McDiarmid MD, Clearance Coots FEMORAL STRESS SYNDROME 01/03/2007   Qualifier: Diagnosis of  By: Haydee Salter     Periodic limb movements of sleep 12/05/2010   Diagnosed on Polysomnography testing Fall 2011.      Pneumonia    walking pneumonia in the 90's   Pre-diabetes 12/25/2008   Qualifier: Diagnosis of  By: McDiarmid MD, Todd     Prolapsed internal hemorrhoids, grade 3, s/p ligation/pexy/hemorrhoidectomy 03/28/2018 03/28/2018   RESTLESS LEGS SYNDROME 09/11/2007   Polysomnography (10/2010, Dr Fannie Knee, interpreter. ) showed periodic limb movements that frequently awoke patient from sleep with arousal index of 4  per hour. Dr Marcelyn Bruins (Sleep Specialist) thought her RLS was the major contributor to patient poor sleep condition, more than any sleep apnea.  He recommended considering opamine agonist (either requip or mirapex) to see if this will help.      Rosacea, acne 10/24/2011   SIGMOID POLYP 01/23/2008   Solitary lung  nodule 04/08/2011   Stage 1 mild COPD by GOLD classification (HCC) 01/03/2007   11/28/2017 Chest CT: Mild centrilobular and paraseptal emphysema with mild diffuse bronchial wall thickening     Stress incontinence    Synovial cyst of lumbar facet joint 02/17/2016   Trigger thumb of left hand 08/16/2018   Urticaria, chronic 05/22/2011   VITAMIN D DEFICIENCY 03/03/2010   Work-related condition 05/17/2018   Past Surgical History:  Procedure Laterality Date   APPENDECTOMY     bladder tack  1998   Dr Annabell Howells (urology)   BUNIONECTOMY  1992   bilateral feet with pins in great toes   CARDIAC CATHETERIZATION     CARPAL TUNNEL RELEASE  2010   Bilateral, Dr Molly Maduro Sypher   COLONOSCOPY  2004   Dr Arty Baumgartner (GI)   CYSTOCELE REPAIR  1998    Dr Huntley Dec (GYN)-   ESOPHAGOGASTRODUODENOSCOPY     EVALUATION UNDER ANESTHESIA WITH HEMORRHOIDECTOMY N/A 03/28/2018   Procedure: ANORECTAL EXAM UNDER ANESTHESIA lateral internal partial sphincterotomy, hemorrhoidal ligation pixie, HEMORRHOIDECTOMY;  Surgeon: Karie Soda, MD;  Location: [facility];  Service: General;  Laterality: N/A;   EXAM ANORECTAL W/ ULTRASOUND     Dr. Michaell Cowing 03-28-18    EXPLORATORY LAPAROTOMY     FACIAL LACERATIONS REPAIR     Facial Laceration repair as adolescent    INJECTION HIP INTRA ARTICULAR  2008   Left Hip fluoroscopically-guided corticosteorid injection for painful osteoarthritis with good effect (06/2007)   LAPAROSCOPIC LYSIS OF ADHESIONS  05/19/2021   Procedure: LAPAROSCOPIC LYSIS OF ADHESIONS; BIOPSY ABDOMINAL SKIN LESION;  Surgeon: Carver Fila, MD;  Location: [facility];  Service: Gynecology;;   LAPAROSCOPY FOR ECTOPIC  PREGNANCY     LUMBAR LAMINECTOMY/DECOMPRESSION MICRODISCECTOMY N/A 06/21/2016   Procedure: MICRO LUMBAR DECOMPRESSION L4-L5 AND REMOVAL OF SYNOVIAL CYST    1 LEVEL;  Surgeon: Jene Every, MD;  Location: [facility];  Service: Orthopedics;  Laterality: N/A;   MRSA     NM MYOVIEW LTD  2011   Dr Verdis Prime, III (Card)   RECTOCELE REPAIR  1998   Dr Huntley Dec (GYN)   ROBOTIC ASSISTED SALPINGO OOPHERECTOMY N/A 05/19/2021   Procedure: XI ROBOTIC ASSISTED LEFT SALPINGO- OOPHORECTOMY WITH PELVIC WASHINGS;  Surgeon: Carver Fila, MD;  Location: [facility];  Service: Gynecology;  Laterality: N/A;   SPHINCTEROTOMY N/A 03/28/2018   Procedure: ANAL SPHINCTEROTOMY;  Surgeon: Karie Soda, MD;  Location: [facility];  Service: General;  Laterality: N/A;   UPPER GASTROINTESTINAL ENDOSCOPY     Family History  Problem Relation Age of Onset   Hypertension Mother    Frontotemporal dementia Mother    Diabetes Father    Heart disease Father    Hypertension Sister    Obesity Sister    Kidney disease Brother    Heart disease Brother    Hypertension Brother    Hypertension Daughter    Bipolar disorder Daughter    Hypertension Daughter    Breast cancer Daughter 14   Cancer Daughter 77       vulvar cancer   Other Daughter        BRIP1 gene mutation c.2010dup   Thyroid disease Daughter    Hypertension Son    Hypertension Son    Colon polyps Son 27   Diabetes Maternal Aunt    Lung cancer Paternal Aunt        d. >50   Cancer Paternal Aunt        unknown type, dx >50   Heart  Problems Paternal Grandmother    Diabetes Paternal Grandfather    Cancer Cousin 17       gynecologic cancer NOS (paternal first cousin)   Cancer Niece 34       gynecologic cancer NOS    Cancer Paternal Great-grandmother        abdominal cancer NOS   Diabetes type II Other    Hypertension Other    Heart attack Other    Alcohol abuse Other    Depression Other    Asthma Other    Migraines Other    Breast cancer Other         paternal great-great aunt (PGF's aunt)   Esophageal cancer Neg Hx    Stomach cancer Neg Hx    Rectal cancer Neg Hx    Colon cancer Neg Hx    Prostate cancer Neg Hx    Pancreatic cancer Neg Hx    Social History   Socioeconomic History   Marital status: Divorced    Spouse name: Not on file   Number of children: 4   Years of education: 12   Highest education level: 12th grade  Occupational History   Occupation: disability  Tobacco Use   Smoking status: Former    Packs/day: 1.50    Years: 44.00    Additional pack years: 0.00    Total pack years: 66.00    Types: Cigarettes    Quit date: 04/06/2008    Years since quitting: 14.9    Passive exposure: Past   Smokeless tobacco: Never  Vaping Use   Vaping Use: Never used  Substance and Sexual Activity   Alcohol use: No   Drug use: Yes    Types: Marijuana    Comment: hx of marijuana use at bedtime, last used 05/30/22   Sexual activity: Not Currently  Other Topics Concern   Not on file  Social History Narrative   [facility] POA document appoints Deon Hooks as agent for Ms Kimera Papageorgiou. Rote (DOB [birth year])/ T. McDiarmid, MD 04/28/21      Retired [place] [employer] worker - early retirement for mental health reasons.    Has Worked part-time at [employer] as a Conservation officer, nature. Patient stopped during Covid.   Patient is divorced.   Quit smoking tobacco June 09 started at age 26. 1 1/2 to 2 ppd .   No etoh    Hx of smokes marijuana - periodically attempts quitting.   Religious - Pentecostal   4 children    Hobbies- word searches and games on her tablet.   No IVDA      Social Information (01/11/2022)    patient lives with daughter in one level apartment ,for past 2 years .    Patient enjoys playing games on her ipad and watching movies . Primary family support persons is her daughter.    Transportation to appointments provided by self;  No concerns or difficulty affording medication:   Strengths:Financial means, Religious Affiliation, Supportive family/friends    Support System: Family, Church, Spirituality and Able to TEFL teacher Activities of Daily Living    Dressing:Self-care   Eating: Self-care   Ambulation: Partial assistance   Toileting: Self-care   Bathing: Self-care       Instrumental Activities of Daily Living   Shopping: Partial assistance   House/Yard Work: Self-care   Administration of medications: Self-care   Finances: Self-care   Telephone: Self-care   Transportation: Self-care      Social Determinants of Health  Financial Resource Strain: Low Risk  (03/26/2023)   Overall Financial Resource Strain (CARDIA)    Difficulty of Paying Living Expenses: Not hard at all  Food Insecurity: No Food Insecurity (03/26/2023)   Hunger Vital Sign    Worried About Running Out of Food in the Last Year: Never true    Ran Out of Food in the Last Year: Never true  Transportation Needs: No Transportation Needs (03/26/2023)   PRAPARE - Administrator, Civil Service (Medical): No    Lack of Transportation (Non-Medical): No  Physical Activity: Inactive (03/26/2023)   Exercise Vital Sign    Days of Exercise per Week: 0 days    Minutes of Exercise per Session: 0 min  Stress: No Stress Concern Present (03/26/2023)   [employer] of Occupational Health - Occupational Stress Questionnaire    Feeling of Stress : Only a little  Social Connections: Socially Isolated (03/26/2023)   Social Connection and Isolation Panel [NHANES]    Frequency of Communication with Friends and Family: More than three times a week    Frequency of Social Gatherings with Friends and Family: Three times a week    Attends Religious Services: Never    Active Member of Clubs or Organizations: No    Attends Engineer, structural: Never    Marital Status: Divorced    Tobacco Counseling Counseling given: Not Answered   Clinical Intake:  Pre-visit preparation completed: Yes  Pain : No/denies pain  Diabetes: Yes CBG done?:  No Did pt. bring in CBG monitor from home?: No  How often do you need to have someone help you when you read instructions, pamphlets, or other written materials from your doctor or pharmacy?: 2 - Rarely  Diabetic? Yes   Nutrition Risk Assessment:  Has the patient had any N/V/D within the last 2 months?  No  Does the patient have any non-healing wounds?  No  Has the patient had any unintentional weight loss or weight gain?  No   Diabetes:  Is the patient diabetic?  Yes  If diabetic, was a CBG obtained today?  No  Did the patient bring in their glucometer from home?  No  How often do you monitor your CBG's? daily.   Financial Strains and Diabetes Management:  Are you having any financial strains with the device, your supplies or your medication? No .  Does the patient want to be seen by Chronic Care Management for management of their diabetes?  No  Would the patient like to be referred to a Nutritionist or for Diabetic Management?  No   Diabetic Exams:  Diabetic Eye Exam: Completed 07/20/22 Diabetic Foot Exam: Overdue, Pt has been advised about the importance in completing this exam. Pt is scheduled for diabetic foot exam on next office visit.   Interpreter Needed?: No  Information entered by :: Kandis Fantasia LPN   Activities of Daily Living    03/26/2023    2:19 PM  In your present state of health, do you have any difficulty performing the following activities:  Hearing? 0  Vision? 0  Difficulty concentrating or making decisions? 0  Walking or climbing stairs? 0  Dressing or bathing? 0  Doing errands, shopping? 0  Preparing Food and eating ? N  Using the Toilet? N  In the past six months, have you accidently leaked urine? N  Do you have problems with loss of bowel control? N  Managing your Medications? N  Managing your Finances? N  Housekeeping or  managing your Housekeeping? N    Patient Care Team: McDiarmid, Leighton Roach, MD as PCP - General Alfredo Martinez, MD  as Consulting Physician (Urology) Marcellina Millin, MD (Psychiatry)  Indicate any recent Medical Services you may have received from other than [facility] providers in the past year (date may be approximate).     Assessment:   This is a routine wellness examination for [name].  Hearing/Vision screen Hearing Screening - Comments:: Denies hearing difficulties   Vision Screening - Comments:: Wears rx glasses - up to date with routine eye exams with [facility].    Dietary issues and exercise activities discussed: Current Exercise Habits: The patient does not participate in regular exercise at present   Goals Addressed               This Visit's Progress     COMPLETED: "I have multiple medical issues, chronic pain, bipolar depression, poor mobility" (pt-stated)        Care Coordination Interventions: Reviewed medications with patient and discussed patient being weaned off some of her pain medications Reviewed scheduled/upcoming provider appointments including ENT appointment on 09/14/22. Discussed plans with patient for ongoing care management follow up and provided patient with direct contact information for care management team Assessed social determinant of health barriers       COMPLETED: Begin and Stick with Counseling-Depression        Timeframe:  Long-Range Goal Priority:  High Start Date:04/12/21                             Expected End Date:                       Patient Goals/Self-Care Activities: Over the next 90 days - spend time or talk with others at least 2 to 3 times per week Call Dr. Ready office to schedule your counseling appointment  Continue with compliance of taking medication   Why is this important?   Beating depression may take some time.  If you don't feel better right away, don't give up on your treatment plan.        Depression Screen    03/26/2023    2:32 PM 03/01/2023    1:41 PM 11/30/2022   10:11 AM 08/24/2022    1:40 PM 05/18/2022   11:00 AM  04/19/2022   11:25 AM 03/02/2022   10:58 AM  PHQ 2/9 Scores  PHQ - 2 Score 4 4 3 4 4 5 5   PHQ- 9 Score 15 15 12 13 11 17 17     Fall Risk    03/26/2023    2:19 PM 03/01/2023    1:41 PM 11/30/2022   10:12 AM 08/24/2022    1:41 PM 05/18/2022   11:01 AM  Fall Risk   Falls in the past year? 0 0 0 0 0  Number falls in past yr: 0 0 0 0 0  Injury with Fall? 0 0 0 0 0  Risk for fall due to : History of fall(s);Impaired mobility      Follow up Falls prevention discussed;Education provided;Falls evaluation completed        FALL RISK PREVENTION PERTAINING TO THE HOME:  Any stairs in or around the home? No  If so, are there any without handrails? No  Home free of loose throw rugs in walkways, pet beds, electrical cords, etc? Yes  Adequate lighting in your home to reduce risk of falls? Yes  ASSISTIVE DEVICES UTILIZED TO PREVENT FALLS:  Life alert? No  Use of a cane, walker or w/c? No  Grab bars in the bathroom? Yes  Shower chair or bench in shower? No  Elevated toilet seat or a handicapped toilet? Yes   TIMED UP AND GO:  Was the test performed? No . Telephonic visit   Cognitive Function:      04/28/2021    4:32 PM  Montreal Cognitive Assessment   Visuospatial/ Executive (0/5) 5  Naming (0/3) 3  Attention: Read list of digits (0/2) 2  Attention: Read list of letters (0/1) 1  Attention: Serial 7 subtraction starting at 100 (0/3) 3  Language: Repeat phrase (0/2) 1  Language : Fluency (0/1) 1  Abstraction (0/2) 2  Delayed Recall (0/5) 4  Orientation (0/6) 6  Total 28  Adjusted Score (based on education) 29      03/26/2023    2:20 PM 01/11/2022    4:20 PM 07/02/2019    2:51 PM  6CIT Screen  What Year? 0 points 0 points 0 points  What month? 0 points 0 points 0 points  What time? 0 points 0 points 0 points  Count back from 20 0 points 0 points 0 points  Months in reverse 0 points 0 points 0 points  Repeat phrase 0 points 0 points 0 points  Total Score 0 points 0 points 0  points    Immunizations Immunization History  Administered Date(s) Administered   Fluad Quad(high Dose 65+) 08/03/2022   Influenza Split 07/22/2012   Influenza Whole 08/07/2007, 08/07/2008, 08/10/2009, 07/22/2010   Influenza, High Dose Seasonal PF 06/27/2017, 07/24/2018, 08/26/2020, 06/28/2021   Influenza,inj,Quad PF,6+ Mos 07/05/2017, 07/02/2019   Influenza-Unspecified 07/06/2013, 07/06/2014, 06/09/2015, 07/07/2016, 07/25/2018   PFIZER Comirnaty(Gray Top)Covid-19 Tri-Sucrose Vaccine 02/09/2021, 07/27/2021, 08/03/2022   PFIZER(Purple Top)SARS-COV-2 Vaccination 12/23/2019, 01/13/2020, 08/26/2020   Pfizer Covid-19 Vaccine Bivalent Booster 76yrs & up 03/02/2022   Pneumococcal Conjugate-13 10/07/2015   Pneumococcal Polysaccharide-23 08/20/2014, 03/22/2018   Respiratory Syncytial Virus Vaccine,Recomb Aduvanted(Arexvy) 08/03/2022   Td 03/06/2002   Tdap 10/20/2013   Zoster Recombinat (Shingrix) 05/05/2017, 12/07/2017    TDAP status: Up to date  Pneumococcal vaccine status: Up to date  Covid-19 vaccine status: Information provided on how to obtain vaccines.   Qualifies for Shingles Vaccine? Yes   Zostavax completed No   Shingrix Completed?: Yes  Screening Tests Health Maintenance  Topic Date Due   FOOT EXAM  09/16/2021   COVID-19 Vaccine (8 - 2023-24 season) 09/28/2022   Medicare Annual Wellness (AWV)  01/12/2023   INFLUENZA VACCINE  06/07/2023   OPHTHALMOLOGY EXAM  07/21/2023   HEMOGLOBIN A1C  08/31/2023   DTaP/Tdap/Td (3 - Td or Tdap) 10/21/2023   Lung Cancer Screening  01/17/2024   Diabetic kidney evaluation - eGFR measurement  02/29/2024   Diabetic kidney evaluation - Urine ACR  02/29/2024   MAMMOGRAM  03/07/2024   Pneumonia Vaccine 20+ Years old  Completed   DEXA SCAN  Completed   Hepatitis C Screening  Completed   Zoster Vaccines- Shingrix  Completed   HPV VACCINES  Aged Out   COLONOSCOPY (Pts 45-56yrs Insurance coverage will need to be confirmed)  Discontinued     Health Maintenance  Health Maintenance Due  Topic Date Due   FOOT EXAM  09/16/2021   COVID-19 Vaccine (8 - 2023-24 season) 09/28/2022   Medicare Annual Wellness (AWV)  01/12/2023    Colorectal cancer screening: Type of screening: Colonoscopy. Completed 06/08/22. Repeat every   years  Mammogram  status: Completed 03/07/22. Repeat every year  Bone Density status: Completed 05/24/16. Results reflect: Bone density results: NORMAL. Repeat every 5 years.  Lung Cancer Screening: (Low Dose CT Chest recommended if Age 71-80 years, 30 pack-year currently smoking OR have quit w/in 15years.) does not qualify.   Lung Cancer Screening Referral: n/a  Additional Screening:  Hepatitis C Screening: does qualify; Completed 03/04/15  Vision Screening: Recommended annual ophthalmology exams for early detection of glaucoma and other disorders of the eye. Is the patient up to date with their annual eye exam?  Yes  Who is the provider or what is the name of the office in which the patient attends annual eye exams? [facility].  If pt is not established with a provider, would they like to be referred to a provider to establish care? No .   Dental Screening: Recommended annual dental exams for proper oral hygiene  Community Resource Referral / Chronic Care Management: CRR required this visit?  No   CCM required this visit?  No      Plan:     I have personally reviewed and noted the following in the patient's chart:   Medical and social history Use of alcohol, tobacco or illicit drugs  Current medications and supplements including opioid prescriptions. Patient is currently taking opioid prescriptions. Information provided to patient regarding non-opioid alternatives. Patient advised to discuss non-opioid treatment plan with their provider. Functional ability and status Nutritional status Physical activity Advanced directives List of other physicians Hospitalizations, surgeries, and ER visits in  previous 12 months Vitals Screenings to include cognitive, depression, and falls Referrals and appointments  In addition, I have reviewed and discussed with patient certain preventive protocols, quality metrics, and best practice recommendations. A written personalized care plan for preventive services as well as general preventive health recommendations were provided to patient.     Durwin Nora, [place]   1/61/0960   Due to this being a virtual visit, the after visit summary with patients personalized plan was offered to patient via mail or my-chart. Patient would like to access on my-chart  Nurse Notes: No concerns

## 2023-03-26 NOTE — Patient Instructions (Addendum)
[name] , Thank you for taking time to come for your Medicare Wellness Visit. I appreciate your ongoing commitment to your health goals. Please review the following plan we discussed and let me know if I can assist you in the future.   These are the goals we discussed:  Goals      Exercise 3x per week     Walking 10-15 minutes in the AM and 10-15 minutes in PM 3x a week.     HEMOGLOBIN A1C < 7.0     Weight (lb) < 226 lb (102.5 kg)     5% weight loss        This is a list of the screening recommended for you and due dates:  Health Maintenance  Topic Date Due   Complete foot exam   09/16/2021   COVID-19 Vaccine (8 - 2023-24 season) 09/28/2022   Flu Shot  06/07/2023   Eye exam for diabetics  07/21/2023   Hemoglobin A1C  08/31/2023   DTaP/Tdap/Td vaccine (3 - Td or Tdap) 10/21/2023   Screening for Lung Cancer  01/17/2024   Yearly kidney function blood test for diabetes  02/29/2024   Yearly kidney health urinalysis for diabetes  02/29/2024   Mammogram  03/07/2024   Medicare Annual Wellness Visit  03/25/2024   Pneumonia Vaccine  Completed   DEXA scan (bone density measurement)  Completed   Hepatitis C Screening: USPSTF Recommendation to screen - Ages 7-79 yo.  Completed   Zoster (Shingles) Vaccine  Completed   HPV Vaccine  Aged Out   Colon Cancer Screening  Discontinued    Advanced directives: Information on Advanced Care Planning can be found at [facility] Advance Health Care Directives Advance Health Care Directives ([email or web address]    Conditions/risks identified: Aim for 30 minutes of exercise or brisk walking, 6-8 glasses of water, and 5 servings of fruits and vegetables each day.   Next appointment: Follow up in one year for your annual wellness visit    Preventive Care 65 Years and Older, Female Preventive care refers to lifestyle choices and visits with your health care provider that can promote health and wellness. What does preventive care  include? A yearly physical exam. This is also called an annual well check. Dental exams once or twice a year. Routine eye exams. Ask your health care provider how often you should have your eyes checked. Personal lifestyle choices, including: Daily care of your teeth and gums. Regular physical activity. Eating a healthy diet. Avoiding tobacco and drug use. Limiting alcohol use. Practicing safe sex. Taking low-dose aspirin every day. Taking vitamin and mineral supplements as recommended by your health care provider. What happens during an annual well check? The services and screenings done by your health care provider during your annual well check will depend on your age, overall health, lifestyle risk factors, and family history of disease. Counseling  Your health care provider may ask you questions about your: Alcohol use. Tobacco use. Drug use. Emotional well-being. Home and relationship well-being. Sexual activity. Eating habits. History of falls. Memory and ability to understand (cognition). Work and work Astronomer. Reproductive health. Screening  You may have the following tests or measurements: Height, weight, and BMI. Blood pressure. Lipid and cholesterol levels. These may be checked every 5 years, or more frequently if you are over 34 years old. Skin check. Lung cancer screening. You may have this screening every year starting at age 21 if you have a 30-pack-year history of smoking and  currently smoke or have quit within the past 15 years. Fecal occult blood test (FOBT) of the stool. You may have this test every year starting at age 107. Flexible sigmoidoscopy or colonoscopy. You may have a sigmoidoscopy every 5 years or a colonoscopy every 10 years starting at age 63. Hepatitis C blood test. Hepatitis B blood test. Sexually transmitted disease (STD) testing. Diabetes screening. This is done by checking your blood sugar (glucose) after you have not eaten for a while  (fasting). You may have this done every 1-3 years. Bone density scan. This is done to screen for osteoporosis. You may have this done starting at age 67. Mammogram. This may be done every 1-2 years. Talk to your health care provider about how often you should have regular mammograms. Talk with your health care provider about your test results, treatment options, and if necessary, the need for more tests. Vaccines  Your health care provider may recommend certain vaccines, such as: Influenza vaccine. This is recommended every year. Tetanus, diphtheria, and acellular pertussis (Tdap, Td) vaccine. You may need a Td booster every 10 years. Zoster vaccine. You may need this after age 47. Pneumococcal 13-valent conjugate (PCV13) vaccine. One dose is recommended after age 69. Pneumococcal polysaccharide (PPSV23) vaccine. One dose is recommended after age 3. Talk to your health care provider about which screenings and vaccines you need and how often you need them. This information is not intended to replace advice given to you by your health care provider. Make sure you discuss any questions you have with your health care provider. Document Released: 11/19/2015 Document Revised: 07/12/2016 Document Reviewed: 08/24/2015 Elsevier Interactive Patient Education  2017 [employer].  Fall Prevention in the Home Falls can cause injuries. They can happen to people of all ages. There are many things you can do to make your home safe and to help prevent falls. What can I do on the outside of my home? Regularly fix the edges of walkways and driveways and fix any cracks. Remove anything that might make you trip as you walk through a door, such as a raised step or threshold. Trim any bushes or trees on the path to your home. Use bright outdoor lighting. Clear any walking paths of anything that might make someone trip, such as rocks or tools. Regularly check to see if handrails are loose or broken. Make sure that  both sides of any steps have handrails. Any raised decks and porches should have guardrails on the edges. Have any leaves, snow, or ice cleared regularly. Use sand or salt on walking paths during winter. Clean up any spills in your garage right away. This includes oil or grease spills. What can I do in the bathroom? Use night lights. Install grab bars by the toilet and in the tub and shower. Do not use towel bars as grab bars. Use non-skid mats or decals in the tub or shower. If you need to sit down in the shower, use a plastic, non-slip stool. Keep the floor dry. Clean up any water that spills on the floor as soon as it happens. Remove soap buildup in the tub or shower regularly. Attach bath mats securely with double-sided non-slip rug tape. Do not have throw rugs and other things on the floor that can make you trip. What can I do in the bedroom? Use night lights. Make sure that you have a light by your bed that is easy to reach. Do not use any sheets or blankets that are too  big for your bed. They should not hang down onto the floor. Have a firm chair that has side arms. You can use this for support while you get dressed. Do not have throw rugs and other things on the floor that can make you trip. What can I do in the kitchen? Clean up any spills right away. Avoid walking on wet floors. Keep items that you use a lot in easy-to-reach places. If you need to reach something above you, use a strong step stool that has a grab bar. Keep electrical cords out of the way. Do not use floor polish or wax that makes floors slippery. If you must use wax, use non-skid floor wax. Do not have throw rugs and other things on the floor that can make you trip. What can I do with my stairs? Do not leave any items on the stairs. Make sure that there are handrails on both sides of the stairs and use them. Fix handrails that are broken or loose. Make sure that handrails are as long as the stairways. Check  any carpeting to make sure that it is firmly attached to the stairs. Fix any carpet that is loose or worn. Avoid having throw rugs at the top or bottom of the stairs. If you do have throw rugs, attach them to the floor with carpet tape. Make sure that you have a light switch at the top of the stairs and the bottom of the stairs. If you do not have them, ask someone to add them for you. What else can I do to help prevent falls? Wear shoes that: Do not have high heels. Have rubber bottoms. Are comfortable and fit you well. Are closed at the toe. Do not wear sandals. If you use a stepladder: Make sure that it is fully opened. Do not climb a closed stepladder. Make sure that both sides of the stepladder are locked into place. Ask someone to hold it for you, if possible. Clearly mark and make sure that you can see: Any grab bars or handrails. First and last steps. Where the edge of each step is. Use tools that help you move around (mobility aids) if they are needed. These include: Canes. Walkers. Scooters. Crutches. Turn on the lights when you go into a dark area. Replace any light bulbs as soon as they burn out. Set up your furniture so you have a clear path. Avoid moving your furniture around. If any of your floors are uneven, fix them. If there are any pets around you, be aware of where they are. Review your medicines with your doctor. Some medicines can make you feel dizzy. This can increase your chance of falling. Ask your doctor what other things that you can do to help prevent falls. This information is not intended to replace advice given to you by your health care provider. Make sure you discuss any questions you have with your health care provider. Document Released: 08/19/2009 Document Revised: 03/30/2016 Document Reviewed: 11/27/2014 Elsevier Interactive Patient Education  2017 [employer].

## 2023-03-28 ENCOUNTER — Encounter: Payer: Self-pay | Admitting: Family Medicine

## 2023-03-28 ENCOUNTER — Other Ambulatory Visit: Payer: Self-pay | Admitting: Family Medicine

## 2023-03-28 DIAGNOSIS — M48061 Spinal stenosis, lumbar region without neurogenic claudication: Secondary | ICD-10-CM

## 2023-03-28 DIAGNOSIS — K529 Noninfective gastroenteritis and colitis, unspecified: Secondary | ICD-10-CM

## 2023-03-28 DIAGNOSIS — G2581 Restless legs syndrome: Secondary | ICD-10-CM

## 2023-03-28 DIAGNOSIS — G894 Chronic pain syndrome: Secondary | ICD-10-CM

## 2023-03-28 DIAGNOSIS — K580 Irritable bowel syndrome with diarrhea: Secondary | ICD-10-CM

## 2023-03-28 DIAGNOSIS — M47896 Other spondylosis, lumbar region: Secondary | ICD-10-CM

## 2023-03-28 DIAGNOSIS — M160 Bilateral primary osteoarthritis of hip: Secondary | ICD-10-CM

## 2023-03-28 MED ORDER — HYDROCODONE-ACETAMINOPHEN 10-325 MG PO TABS
1.0000 | ORAL_TABLET | Freq: Four times a day (QID) | ORAL | 0 refills | Status: DC | PRN
Start: 2023-03-28 — End: 2023-04-30

## 2023-03-28 MED ORDER — MORPHINE SULFATE ER 15 MG PO TBCR
15.0000 mg | EXTENDED_RELEASE_TABLET | Freq: Every day | ORAL | 0 refills | Status: DC
Start: 2023-03-28 — End: 2023-04-30

## 2023-03-28 NOTE — Telephone Encounter (Signed)
Medications pended to this encounter.   Veronda Prude, RN

## 2023-04-09 ENCOUNTER — Other Ambulatory Visit: Payer: PPO

## 2023-04-12 ENCOUNTER — Other Ambulatory Visit: Payer: PPO

## 2023-04-12 DIAGNOSIS — E039 Hypothyroidism, unspecified: Secondary | ICD-10-CM

## 2023-04-13 LAB — TSH: TSH: 2.02 u[IU]/mL (ref 0.450–4.500)

## 2023-04-14 ENCOUNTER — Other Ambulatory Visit: Payer: Self-pay | Admitting: Family Medicine

## 2023-04-14 DIAGNOSIS — E039 Hypothyroidism, unspecified: Secondary | ICD-10-CM

## 2023-04-16 ENCOUNTER — Other Ambulatory Visit: Payer: Self-pay | Admitting: Family Medicine

## 2023-04-19 ENCOUNTER — Ambulatory Visit: Payer: PPO

## 2023-04-27 ENCOUNTER — Other Ambulatory Visit: Payer: Self-pay | Admitting: Family Medicine

## 2023-04-27 DIAGNOSIS — E119 Type 2 diabetes mellitus without complications: Secondary | ICD-10-CM

## 2023-04-30 ENCOUNTER — Encounter: Payer: Self-pay | Admitting: Family Medicine

## 2023-04-30 DIAGNOSIS — G894 Chronic pain syndrome: Secondary | ICD-10-CM

## 2023-04-30 DIAGNOSIS — M160 Bilateral primary osteoarthritis of hip: Secondary | ICD-10-CM

## 2023-04-30 DIAGNOSIS — G2581 Restless legs syndrome: Secondary | ICD-10-CM

## 2023-04-30 DIAGNOSIS — M47896 Other spondylosis, lumbar region: Secondary | ICD-10-CM

## 2023-04-30 DIAGNOSIS — M48061 Spinal stenosis, lumbar region without neurogenic claudication: Secondary | ICD-10-CM

## 2023-04-30 MED ORDER — MORPHINE SULFATE ER 15 MG PO TBCR
15.0000 mg | EXTENDED_RELEASE_TABLET | Freq: Every day | ORAL | 0 refills | Status: DC
Start: 2023-04-30 — End: 2023-05-31

## 2023-04-30 MED ORDER — HYDROCODONE-ACETAMINOPHEN 10-325 MG PO TABS
1.0000 | ORAL_TABLET | Freq: Four times a day (QID) | ORAL | 0 refills | Status: DC | PRN
Start: 2023-04-30 — End: 2023-05-31

## 2023-04-30 NOTE — Telephone Encounter (Signed)
Prescriptions sent

## 2023-05-03 ENCOUNTER — Ambulatory Visit: Payer: PPO

## 2023-05-06 ENCOUNTER — Other Ambulatory Visit: Payer: Self-pay | Admitting: Family Medicine

## 2023-05-06 DIAGNOSIS — E039 Hypothyroidism, unspecified: Secondary | ICD-10-CM

## 2023-05-06 DIAGNOSIS — K580 Irritable bowel syndrome with diarrhea: Secondary | ICD-10-CM

## 2023-05-06 DIAGNOSIS — K529 Noninfective gastroenteritis and colitis, unspecified: Secondary | ICD-10-CM

## 2023-05-31 ENCOUNTER — Encounter: Payer: Self-pay | Admitting: Family Medicine

## 2023-05-31 ENCOUNTER — Ambulatory Visit: Payer: PPO

## 2023-05-31 ENCOUNTER — Ambulatory Visit (INDEPENDENT_AMBULATORY_CARE_PROVIDER_SITE_OTHER): Payer: PPO | Admitting: Family Medicine

## 2023-05-31 VITALS — BP 178/68 | HR 60

## 2023-05-31 DIAGNOSIS — M47896 Other spondylosis, lumbar region: Secondary | ICD-10-CM | POA: Diagnosis not present

## 2023-05-31 DIAGNOSIS — M48061 Spinal stenosis, lumbar region without neurogenic claudication: Secondary | ICD-10-CM | POA: Diagnosis not present

## 2023-05-31 DIAGNOSIS — G2581 Restless legs syndrome: Secondary | ICD-10-CM | POA: Diagnosis not present

## 2023-05-31 DIAGNOSIS — I152 Hypertension secondary to endocrine disorders: Secondary | ICD-10-CM | POA: Diagnosis not present

## 2023-05-31 DIAGNOSIS — M160 Bilateral primary osteoarthritis of hip: Secondary | ICD-10-CM | POA: Diagnosis not present

## 2023-05-31 DIAGNOSIS — G894 Chronic pain syndrome: Secondary | ICD-10-CM | POA: Diagnosis not present

## 2023-05-31 DIAGNOSIS — E1159 Type 2 diabetes mellitus with other circulatory complications: Secondary | ICD-10-CM | POA: Diagnosis not present

## 2023-05-31 MED ORDER — MORPHINE SULFATE ER 15 MG PO TBCR
15.0000 mg | EXTENDED_RELEASE_TABLET | Freq: Every day | ORAL | 0 refills | Status: DC
Start: 2023-05-31 — End: 2023-06-28

## 2023-05-31 MED ORDER — HYDROCODONE-ACETAMINOPHEN 10-325 MG PO TABS
1.0000 | ORAL_TABLET | Freq: Four times a day (QID) | ORAL | 0 refills | Status: DC | PRN
Start: 2023-05-31 — End: 2023-06-28

## 2023-05-31 NOTE — Assessment & Plan Note (Signed)
Established problem , controlled. Patient is at goal of adequate analgesia for back and hips.  Able to perform ADLs and iADLs with analgesia.  No aberrant behaviors.  PDMP reviewed.  No adverse reactions. Continue MS Contin 15 mg daily with hydrocodone-APAP 10-325 #52 tab

## 2023-05-31 NOTE — Assessment & Plan Note (Signed)
Established problem Uncontrolled.  Patient is not at goal of <140/90. Ms Kuk's mother died yesterday.  She is actively grieving the loss.  Will repeat assessment at next office visit.  Conitnue current presciptions.

## 2023-05-31 NOTE — Progress Notes (Signed)
[name] is alone Sources of clinical information for visit is/are patient. Nursing assessment for this office visit was reviewed with the patient for accuracy and revision.     Previous Report(s) Reviewed: none     03/26/2023    2:32 PM  Depression screen PHQ 2/9  Decreased Interest 2  Down, Depressed, Hopeless 2  PHQ - 2 Score 4  Altered sleeping 3  Tired, decreased energy 1  Change in appetite 2  Feeling bad or failure about yourself  2  Trouble concentrating 3  Moving slowly or fidgety/restless 0  Suicidal thoughts 0  PHQ-9 Score 15   Flowsheet Row Clinical Support from 03/26/2023 in [facility] Office Visit from 03/01/2023 in [facility] Office Visit from 11/30/2022 in [facility]  Thoughts that you would be better off dead, or of hurting yourself in some way Not at all Not at all Not at all  PHQ-9 Total Score 15 15 12           05/31/2023    2:07 PM 03/26/2023    2:19 PM 03/01/2023    1:41 PM 11/30/2022   10:12 AM 08/24/2022    1:41 PM  Fall Risk   Falls in the past year? 0 0 0 0 0  Number falls in past yr: 0 0 0 0 0  Injury with Fall? 0 0 0 0 0  Risk for fall due to : No Fall Risks History of fall(s);Impaired mobility     Follow up  Falls prevention discussed;Education provided;Falls evaluation completed          03/26/2023    2:32 PM 03/01/2023    1:41 PM 11/30/2022   10:11 AM  PHQ9 SCORE ONLY  PHQ-9 Total Score 15 15 12     There are no preventive care reminders to display for this patient.  Health Maintenance Due  Topic Date Due   FOOT EXAM  09/16/2021   COVID-19 Vaccine (8 - 2023-24 season) 09/28/2022      History/P.E. limitations: none  There are no preventive care reminders to display for this patient.  Diabetes Health Maintenance Due  Topic Date Due   FOOT EXAM  09/16/2021   OPHTHALMOLOGY EXAM  07/21/2023   HEMOGLOBIN A1C  08/31/2023    Health Maintenance Due  Topic Date  Due   FOOT EXAM  09/16/2021   COVID-19 Vaccine (8 - 2023-24 season) 09/28/2022     No chief complaint on file.    --------------------------------------------------------------------------------------------------------------------------------------------- Visit Problem List with A/P  No problem-specific Assessment & Plan notes found for this encounter.

## 2023-05-31 NOTE — Patient Instructions (Signed)
I am so very sorry for your loss. There is no loss like the loss of a mother.

## 2023-06-11 ENCOUNTER — Encounter: Payer: Self-pay | Admitting: Family Medicine

## 2023-06-19 IMAGING — MG MM DIGITAL SCREENING BILAT W/ TOMO AND CAD
8 of 15 series · 8 of 40 positions shown · non-contrast
Comparison: Previous exam(s).

ACR Breast Density Category a: The breast tissue is almost entirely
fatty.

CLINICAL DATA: Screening.

EXAM:
DIGITAL SCREENING BILATERAL MAMMOGRAM WITH TOMOSYNTHESIS AND CAD
TECHNIQUE: Bilateral screening digital craniocaudal and mediolateral oblique
mammograms were obtained. Bilateral screening digital breast
tomosynthesis was performed. The images were evaluated with
computer-aided detection.

[R XCCL synth-2D]
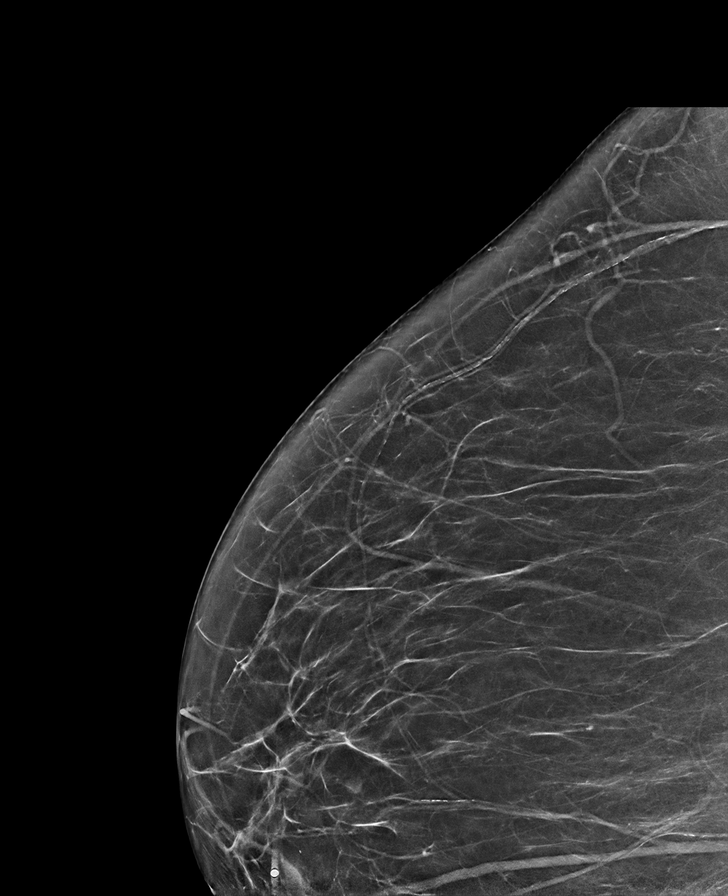

[L MLO synth-2D (1 of 2)]
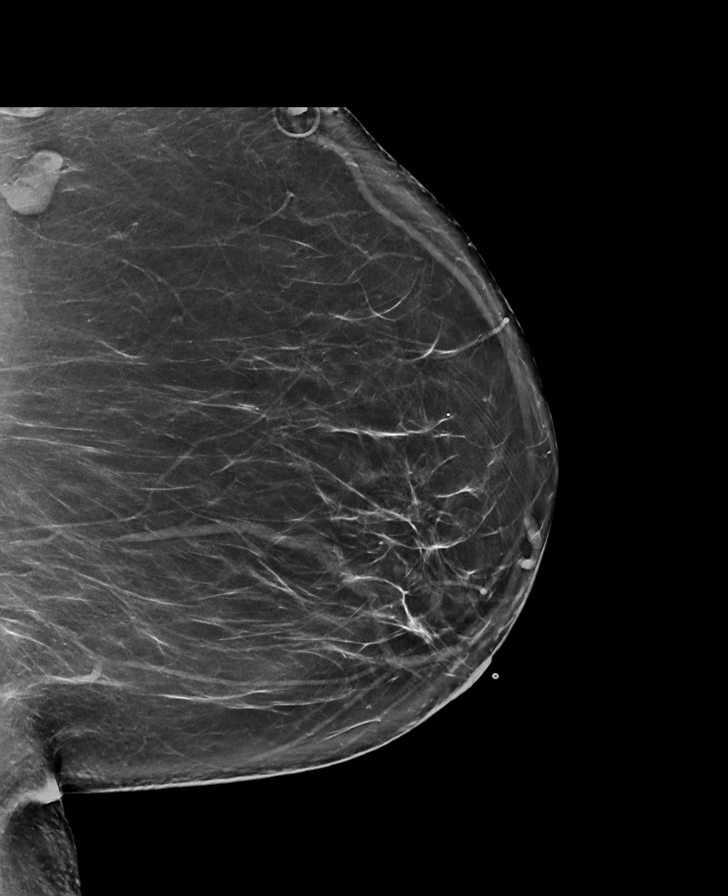

[R MLO synth-2D (1 of 2)]
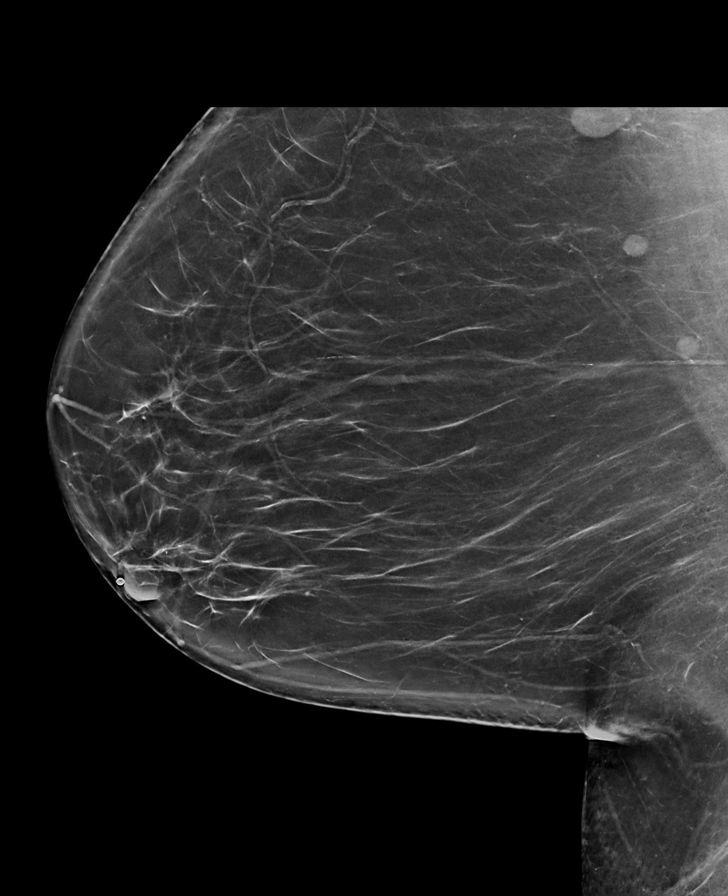

[R MLO synth-2D (2 of 2)]
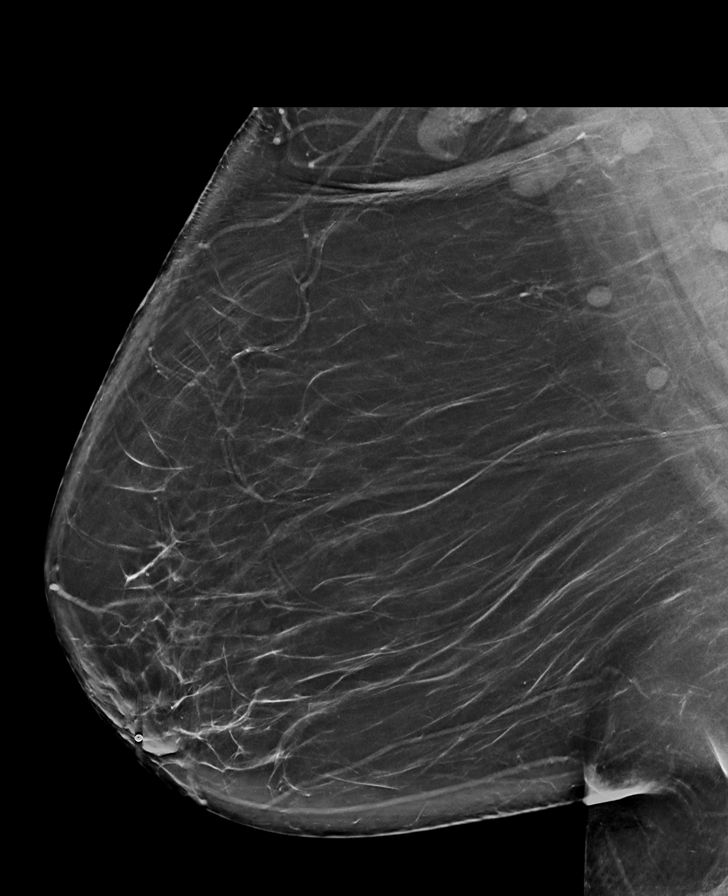

[L CC synth-2D]
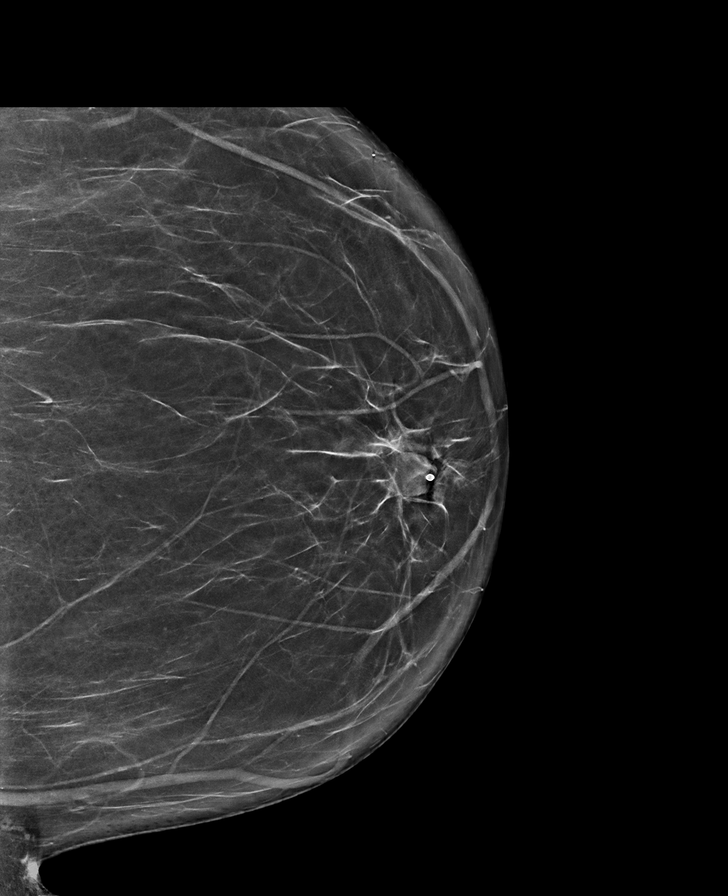

[L MLO synth-2D (2 of 2)]
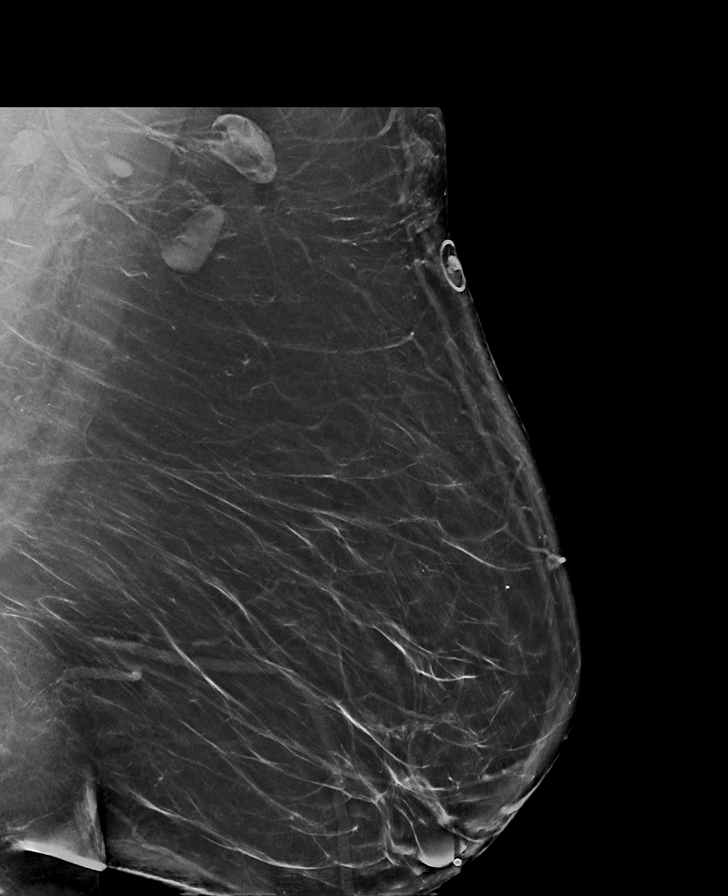

[L XCCL synth-2D]
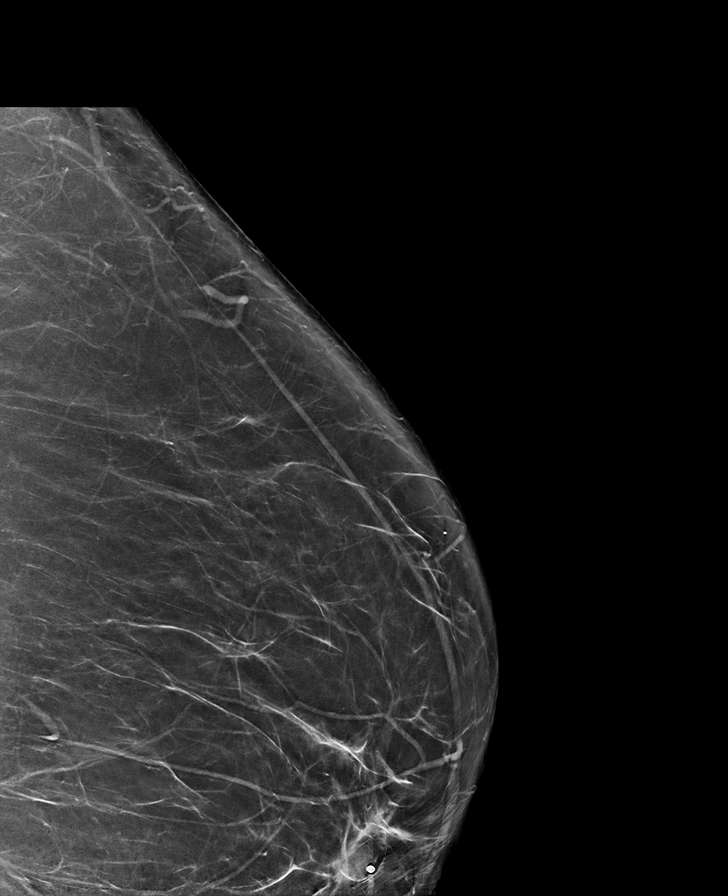

[R XCCL tomo · tomo slice 51/74.0]
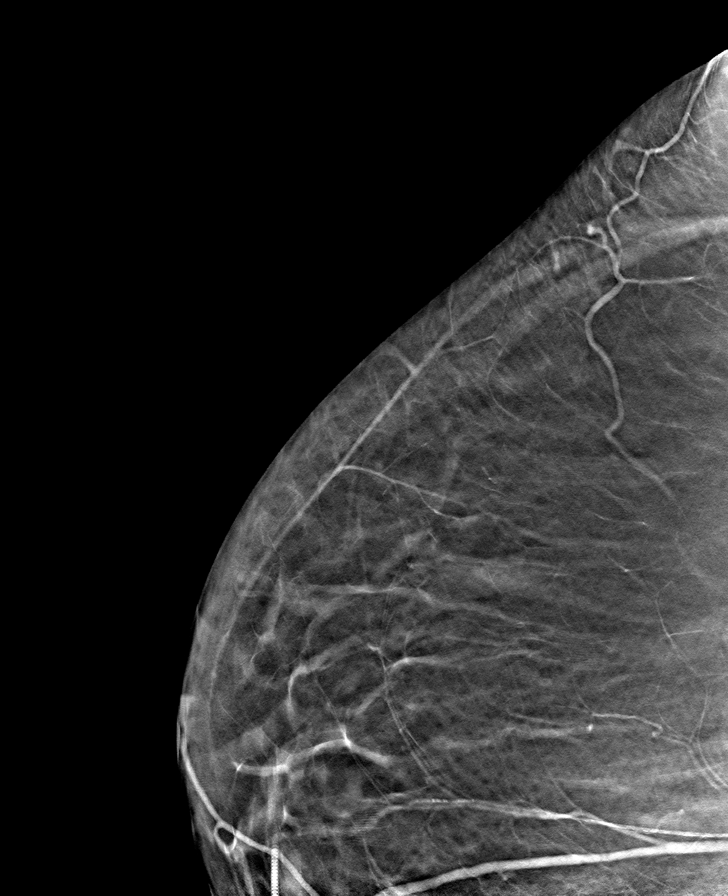

[8 of 40 positions shown; findings below may reference images not displayed]

FINDINGS: There are no findings suspicious for malignancy.
IMPRESSION: No mammographic evidence of malignancy. A result letter of this
screening mammogram will be mailed directly to the patient.

RECOMMENDATION:
Screening mammogram in one year. (Code:0E-[id number])

BI-RADS CATEGORY  1: Negative.

## 2023-06-24 ENCOUNTER — Other Ambulatory Visit: Payer: Self-pay | Admitting: Family Medicine

## 2023-06-24 DIAGNOSIS — N3946 Mixed incontinence: Secondary | ICD-10-CM

## 2023-06-27 ENCOUNTER — Encounter: Payer: Self-pay | Admitting: Family Medicine

## 2023-06-27 DIAGNOSIS — G2581 Restless legs syndrome: Secondary | ICD-10-CM

## 2023-06-27 DIAGNOSIS — G894 Chronic pain syndrome: Secondary | ICD-10-CM

## 2023-06-27 DIAGNOSIS — M47896 Other spondylosis, lumbar region: Secondary | ICD-10-CM

## 2023-06-27 DIAGNOSIS — M48061 Spinal stenosis, lumbar region without neurogenic claudication: Secondary | ICD-10-CM

## 2023-06-27 DIAGNOSIS — M160 Bilateral primary osteoarthritis of hip: Secondary | ICD-10-CM

## 2023-06-28 MED ORDER — MORPHINE SULFATE ER 15 MG PO TBCR
15.0000 mg | EXTENDED_RELEASE_TABLET | Freq: Every day | ORAL | 0 refills | Status: DC
Start: 2023-06-30 — End: 2023-08-21

## 2023-06-28 MED ORDER — HYDROCODONE-ACETAMINOPHEN 10-325 MG PO TABS
1.0000 | ORAL_TABLET | Freq: Four times a day (QID) | ORAL | 0 refills | Status: AC | PRN
Start: 2023-06-30 — End: 2023-07-30

## 2023-07-03 DIAGNOSIS — E119 Type 2 diabetes mellitus without complications: Secondary | ICD-10-CM | POA: Diagnosis not present

## 2023-07-03 DIAGNOSIS — H26493 Other secondary cataract, bilateral: Secondary | ICD-10-CM | POA: Diagnosis not present

## 2023-07-03 DIAGNOSIS — Z961 Presence of intraocular lens: Secondary | ICD-10-CM | POA: Diagnosis not present

## 2023-07-08 ENCOUNTER — Encounter: Payer: Self-pay | Admitting: Family Medicine

## 2023-07-11 NOTE — Telephone Encounter (Signed)
Attempted to reach patient. No answer. LVM for patient to call the office to make a 2week follow up with doctor McDiarmid. Ok to double book if need towards the end of clinic. Aquilla Solian, CMA

## 2023-07-17 DIAGNOSIS — H26492 Other secondary cataract, left eye: Secondary | ICD-10-CM | POA: Diagnosis not present

## 2023-07-18 DIAGNOSIS — Z0289 Encounter for other administrative examinations: Secondary | ICD-10-CM | POA: Diagnosis not present

## 2023-07-18 LAB — HM DIABETES EYE EXAM

## 2023-07-19 ENCOUNTER — Ambulatory Visit (INDEPENDENT_AMBULATORY_CARE_PROVIDER_SITE_OTHER): Payer: PPO | Admitting: Family Medicine

## 2023-07-19 ENCOUNTER — Encounter: Payer: Self-pay | Admitting: Family Medicine

## 2023-07-19 VITALS — BP 137/57 | HR 60 | Ht 64.0 in | Wt 242.2 lb

## 2023-07-19 DIAGNOSIS — J209 Acute bronchitis, unspecified: Secondary | ICD-10-CM

## 2023-07-19 DIAGNOSIS — M160 Bilateral primary osteoarthritis of hip: Secondary | ICD-10-CM

## 2023-07-19 DIAGNOSIS — Z23 Encounter for immunization: Secondary | ICD-10-CM | POA: Diagnosis not present

## 2023-07-19 DIAGNOSIS — M48061 Spinal stenosis, lumbar region without neurogenic claudication: Secondary | ICD-10-CM | POA: Diagnosis not present

## 2023-07-19 DIAGNOSIS — G894 Chronic pain syndrome: Secondary | ICD-10-CM

## 2023-07-19 DIAGNOSIS — M797 Fibromyalgia: Secondary | ICD-10-CM

## 2023-07-19 DIAGNOSIS — J440 Chronic obstructive pulmonary disease with acute lower respiratory infection: Secondary | ICD-10-CM

## 2023-07-19 MED ORDER — TIZANIDINE HCL 2 MG PO TABS
2.0000 mg | ORAL_TABLET | Freq: Four times a day (QID) | ORAL | 5 refills | Status: AC | PRN
Start: 2023-07-19 — End: 2024-01-15

## 2023-07-19 MED ORDER — MORPHINE SULFATE ER 15 MG PO TBCR
15.0000 mg | EXTENDED_RELEASE_TABLET | Freq: Two times a day (BID) | ORAL | 0 refills | Status: DC
Start: 2023-07-19 — End: 2023-08-21

## 2023-07-19 NOTE — Patient Instructions (Signed)
Restart your MS Contin 15 mg twice a day for your worsening lower back pain.

## 2023-07-20 DIAGNOSIS — J209 Acute bronchitis, unspecified: Secondary | ICD-10-CM | POA: Insufficient documentation

## 2023-07-20 MED ORDER — AZITHROMYCIN 250 MG PO TABS: ORAL_TABLET | ORAL | 0 refills | Status: DC

## 2023-07-20 NOTE — Progress Notes (Signed)
[name] is alone Sources of clinical information for visit is/are patient. Nursing assessment for this office visit was reviewed with the patient for accuracy and revision.     Previous Report(s) Reviewed: none     07/19/2023   10:46 AM  Depression screen PHQ 2/9  Decreased Interest 3  Down, Depressed, Hopeless 2  PHQ - 2 Score 5  Altered sleeping 2  Tired, decreased energy 3  Change in appetite 1  Feeling bad or failure about yourself  2  Trouble concentrating 2  Moving slowly or fidgety/restless 0  Suicidal thoughts 0  PHQ-9 Score 15   Flowsheet Row Office Visit from 07/19/2023 in [facility] Clinical Support from 03/26/2023 in [facility] Office Visit from 03/01/2023 in [facility]  Thoughts that you would be better off dead, or of hurting yourself in some way Not at all Not at all Not at all  PHQ-9 Total Score 15 15 15           07/19/2023   10:47 AM 05/31/2023    2:07 PM 03/26/2023    2:19 PM 03/01/2023    1:41 PM 11/30/2022   10:12 AM  Fall Risk   Falls in the past year? 1 0 0 0 0  Number falls in past yr: 0 0 0 0 0  Injury with Fall? 0 0 0 0 0  Risk for fall due to :  No Fall Risks History of fall(s);Impaired mobility    Follow up   Falls prevention discussed;Education provided;Falls evaluation completed         07/19/2023   10:46 AM 03/26/2023    2:32 PM 03/01/2023    1:41 PM  PHQ9 SCORE ONLY  PHQ-9 Total Score 15 15 15     There are no preventive care reminders to display for this patient.  Health Maintenance Due  Topic Date Due   FOOT EXAM  09/16/2021      History/P.E. limitations: none  There are no preventive care reminders to display for this patient.  Diabetes Health Maintenance Due  Topic Date Due   FOOT EXAM  09/16/2021   HEMOGLOBIN A1C  08/31/2023   OPHTHALMOLOGY EXAM  07/17/2024    Health Maintenance Due  Topic Date Due   FOOT EXAM  09/16/2021     Chief Complaint   Patient presents with   Cough   Sciatic nerve pain     --------------------------------------------------------------------------------------------------------------------------------------------- Visit Problem List with A/P  No problem-specific Assessment & Plan notes found for this encounter.

## 2023-07-20 NOTE — Assessment & Plan Note (Signed)
Established problem worsened.  Patient is not at goal of adequate analgesic control to allow facile performance of ADLs and iADLs.   HPI Uses HC-APAP 10-325 #52/month as well as MS Contin 15 mg qhs  which is not  help her since worsening of her back pain.   Patient recently slipped on a sleeping bag while getting out of the bed and feel on her right side without head trauma, since then her pain of her right lower back has worsened with intermittent radiation of pain into right anterior thigh and across medial to lateral foreleg on right.   The pain is worse during the day and interfering with her ability to perform her ADLs and iADLS without taxing and considerable effort. It has become difficult to get out of the home without fatiguing herself from increase in pain.  She can still sleep but finding a position which eases pain enough to fall asleep has become more difficult  Her daughter, who is experiencing decreased funtional capacity and is her housemate sharing rent, is placing greater physical and financial demands on [place].  This stress is significantly affecting her sense of well-being.   Pain Inventory Average Pain: 8 out 10 Pain Right Now: same PSH significant L4-5 laminectomy and microdiscectomy in 2017 by Dr Shelle Iron (Ortho) Prior Studies No further lumbar spine imaging since 2017.   Patient would like to go for Physical Therapy but cannot afford the copay $40 per session.   Back Exam: Inspection: TTP lumbar left lower paraspinal  Bilateral leg strength 5/5 Gait: slgiht right sided atalgia   A/ Exacerbation of Chronic back pain  Start: Restart daytime MS Contin 15 mg  Continue: bedtime MS Cointin 15 mg and Hc-APAP 10-325 prn RTC 4 weeks to reassess - if unimproved, then possible MRI lumbar look for HNP

## 2023-07-23 ENCOUNTER — Ambulatory Visit
Admission: RE | Admit: 2023-07-23 | Discharge: 2023-07-23 | Disposition: A | Discharge status: Home / Self Care | Payer: PPO | Source: Ambulatory Visit | Attending: Acute Care | Admitting: Acute Care

## 2023-07-23 DIAGNOSIS — R911 Solitary pulmonary nodule: Secondary | ICD-10-CM | POA: Diagnosis not present

## 2023-07-23 DIAGNOSIS — Z87891 Personal history of nicotine dependence: Secondary | ICD-10-CM

## 2023-08-06 ENCOUNTER — Encounter: Payer: Self-pay | Admitting: Family Medicine

## 2023-08-06 DIAGNOSIS — G2581 Restless legs syndrome: Secondary | ICD-10-CM

## 2023-08-06 DIAGNOSIS — M160 Bilateral primary osteoarthritis of hip: Secondary | ICD-10-CM

## 2023-08-06 DIAGNOSIS — M47896 Other spondylosis, lumbar region: Secondary | ICD-10-CM

## 2023-08-06 DIAGNOSIS — G894 Chronic pain syndrome: Secondary | ICD-10-CM

## 2023-08-06 DIAGNOSIS — M48061 Spinal stenosis, lumbar region without neurogenic claudication: Secondary | ICD-10-CM

## 2023-08-07 DIAGNOSIS — F4312 Post-traumatic stress disorder, chronic: Secondary | ICD-10-CM | POA: Diagnosis not present

## 2023-08-07 DIAGNOSIS — F3181 Bipolar II disorder: Secondary | ICD-10-CM | POA: Diagnosis not present

## 2023-08-07 MED ORDER — HYDROCODONE-ACETAMINOPHEN 10-325 MG PO TABS: 1.0000 | ORAL_TABLET | Freq: Four times a day (QID) | ORAL | 0 refills | Status: DC | PRN

## 2023-08-20 ENCOUNTER — Encounter: Payer: Self-pay | Admitting: Family Medicine

## 2023-08-20 DIAGNOSIS — M160 Bilateral primary osteoarthritis of hip: Secondary | ICD-10-CM

## 2023-08-20 DIAGNOSIS — M48061 Spinal stenosis, lumbar region without neurogenic claudication: Secondary | ICD-10-CM

## 2023-08-20 DIAGNOSIS — G894 Chronic pain syndrome: Secondary | ICD-10-CM

## 2023-08-21 MED ORDER — MORPHINE SULFATE ER 15 MG PO TBCR: 15.0000 mg | EXTENDED_RELEASE_TABLET | Freq: Two times a day (BID) | ORAL | 0 refills | Status: DC

## 2023-08-28 DIAGNOSIS — F319 Bipolar disorder, unspecified: Secondary | ICD-10-CM | POA: Diagnosis not present

## 2023-08-28 DIAGNOSIS — G40909 Epilepsy, unspecified, not intractable, without status epilepticus: Secondary | ICD-10-CM | POA: Diagnosis not present

## 2023-08-28 DIAGNOSIS — E1159 Type 2 diabetes mellitus with other circulatory complications: Secondary | ICD-10-CM | POA: Diagnosis not present

## 2023-08-28 DIAGNOSIS — Z6841 Body Mass Index (BMI) 40.0 and over, adult: Secondary | ICD-10-CM | POA: Diagnosis not present

## 2023-08-28 DIAGNOSIS — Z515 Encounter for palliative care: Secondary | ICD-10-CM | POA: Diagnosis not present

## 2023-08-28 DIAGNOSIS — J449 Chronic obstructive pulmonary disease, unspecified: Secondary | ICD-10-CM | POA: Diagnosis not present

## 2023-08-29 ENCOUNTER — Telehealth: Payer: Self-pay | Admitting: Acute Care

## 2023-08-29 DIAGNOSIS — Z87891 Personal history of nicotine dependence: Secondary | ICD-10-CM

## 2023-08-29 DIAGNOSIS — R911 Solitary pulmonary nodule: Secondary | ICD-10-CM

## 2023-08-29 NOTE — Telephone Encounter (Signed)
Patient had LDCT on 07/23/2023 that resulted as a Lung-RADS 3 with the radiologist recommending a 6 month follow up scan. Patient had a previous scan in 01/2023. There has been growth of a nodule in 6 months from 17.94mm to 18.78mm and a new 5.84mm nodule. After review by Kandice Robinsons NP she agrees a 6 month follow up scan to assure stability of nodules.

## 2023-08-29 NOTE — Telephone Encounter (Signed)
Attempted to contact pt. Left VM for pt to call regarding recent LDCT results.

## 2023-08-30 ENCOUNTER — Ambulatory Visit: Payer: PPO | Admitting: Family Medicine

## 2023-08-30 ENCOUNTER — Encounter: Payer: Self-pay | Admitting: Family Medicine

## 2023-08-30 VITALS — BP 149/65 | HR 61 | Ht 64.0 in | Wt 237.8 lb

## 2023-08-30 DIAGNOSIS — G894 Chronic pain syndrome: Secondary | ICD-10-CM

## 2023-08-30 DIAGNOSIS — Z79899 Other long term (current) drug therapy: Secondary | ICD-10-CM

## 2023-08-30 DIAGNOSIS — E119 Type 2 diabetes mellitus without complications: Secondary | ICD-10-CM | POA: Diagnosis not present

## 2023-08-30 DIAGNOSIS — M48061 Spinal stenosis, lumbar region without neurogenic claudication: Secondary | ICD-10-CM | POA: Diagnosis not present

## 2023-08-30 DIAGNOSIS — E785 Hyperlipidemia, unspecified: Secondary | ICD-10-CM | POA: Diagnosis not present

## 2023-08-30 DIAGNOSIS — M160 Bilateral primary osteoarthritis of hip: Secondary | ICD-10-CM | POA: Diagnosis not present

## 2023-08-30 DIAGNOSIS — M47896 Other spondylosis, lumbar region: Secondary | ICD-10-CM

## 2023-08-30 DIAGNOSIS — E1159 Type 2 diabetes mellitus with other circulatory complications: Secondary | ICD-10-CM

## 2023-08-30 DIAGNOSIS — Z7984 Long term (current) use of oral hypoglycemic drugs: Secondary | ICD-10-CM | POA: Diagnosis not present

## 2023-08-30 DIAGNOSIS — G2581 Restless legs syndrome: Secondary | ICD-10-CM

## 2023-08-30 DIAGNOSIS — I152 Hypertension secondary to endocrine disorders: Secondary | ICD-10-CM

## 2023-08-30 DIAGNOSIS — E1169 Type 2 diabetes mellitus with other specified complication: Secondary | ICD-10-CM | POA: Diagnosis not present

## 2023-08-30 LAB — POCT GLYCOSYLATED HEMOGLOBIN (HGB A1C): HbA1c, POC (controlled diabetic range): 5.9 % (ref 0.0–7.0)

## 2023-08-30 NOTE — Telephone Encounter (Signed)
Spoke with pt and reviewed LDCT results. One new small lung nodule noted. We will repeat scan in 6 months to follow up. PT verbalized understanding. Results/ plans faxed to PCP. Order placed for 6 month nodule f/u [facility] CT.

## 2023-08-30 NOTE — Patient Instructions (Addendum)
Let me know when you need refills of your hydrocodne and MS Contin.  We are checking your cholesterol and A1c.   Dr Kooper Chriswell will let you know it they are abnormal.  If they are normal, he will send your a message through your MyChart

## 2023-08-30 NOTE — Progress Notes (Signed)
[name] is alone Sources of clinical information for visit is/are patient. Nursing assessment for this office visit was reviewed with the patient for accuracy and revision.     Previous Report(s) Reviewed: none     08/30/2023   11:19 AM  Depression screen PHQ 2/9  Decreased Interest 2  Down, Depressed, Hopeless 2  PHQ - 2 Score 4  Altered sleeping 2  Tired, decreased energy 3  Change in appetite 1  Feeling bad or failure about yourself  1  Trouble concentrating 3  Moving slowly or fidgety/restless 0  Suicidal thoughts 0  PHQ-9 Score 14   Flowsheet Row Office Visit from 08/30/2023 in [facility] Office Visit from 07/19/2023 in [facility] Clinical Support from 03/26/2023 in [facility]  Thoughts that you would be better off dead, or of hurting yourself in some way Not at all Not at all Not at all  PHQ-9 Total Score 14 15 15           08/30/2023   11:19 AM 07/19/2023   10:47 AM 05/31/2023    2:07 PM 03/26/2023    2:19 PM 03/01/2023    1:41 PM  Fall Risk   Falls in the past year? 0 1 0 0 0  Number falls in past yr: 0 0 0 0 0  Injury with Fall? 0 0 0 0 0  Risk for fall due to :   No Fall Risks History of fall(s);Impaired mobility   Follow up    Falls prevention discussed;Education provided;Falls evaluation completed        08/30/2023   11:19 AM 07/19/2023   10:46 AM 03/26/2023    2:32 PM  PHQ9 SCORE ONLY  PHQ-9 Total Score 14 15 15     There are no preventive care reminders to display for this patient.  Health Maintenance Due  Topic Date Due   FOOT EXAM  09/16/2021      History/P.E. limitations: none  There are no preventive care reminders to display for this patient.  Diabetes Health Maintenance Due  Topic Date Due   FOOT EXAM  09/16/2021   HEMOGLOBIN A1C  08/31/2023   OPHTHALMOLOGY EXAM  07/17/2024    Health Maintenance Due  Topic Date Due   FOOT EXAM  09/16/2021     Chief  Complaint  Patient presents with   3 mon f/u   Back Pain     --------------------------------------------------------------------------------------------------------------------------------------------- Visit Problem List with A/P  No problem-specific Assessment & Plan notes found for this encounter.

## 2023-08-31 ENCOUNTER — Encounter: Payer: Self-pay | Admitting: Family Medicine

## 2023-08-31 LAB — LIPID PANEL
Chol/HDL Ratio: 2.2 ratio (ref 0.0–4.4)
Cholesterol, Total: 119 mg/dL (ref 100–199)
HDL: 55 mg/dL (ref >39)
LDL Chol Calc (NIH): 42 mg/dL (ref 0–99)
Triglycerides: 126 mg/dL (ref 0–149)
VLDL Cholesterol Cal: 22 mg/dL (ref 5–40)

## 2023-08-31 NOTE — Assessment & Plan Note (Signed)
Lipids:    Component Value Date/Time   CHOL 119 08/30/2023 1544   TRIG 126 08/30/2023 1544   HDL 55 08/30/2023 1544   LDLDIRECT 58 08/07/2019 1353   LDLDIRECT 78 07/22/2012 1206   VLDL 45 (H) 12/14/2016 1231   CHOLHDL 2.2 08/30/2023 1544   CHOLHDL 4.3 12/14/2016 1231   Established problem Well Controlled. Patient is at goal of LDL < 100. No signs of complications, medication side effects, or red flags. Continue current medications and other regiments.

## 2023-08-31 NOTE — Assessment & Plan Note (Signed)
Established problem that has improved and has meet goal of adeqaute analgesia, ability to perform ADLs and iADLs, no no reported adverse effects. PDMP review without red flags.  Continue MS Contin 15 mg twice a day and decrease Norco 5/325 to one tablet at bedtime.  Plan to dispense 31 tablet Norco at nexr dispense.

## 2023-08-31 NOTE — Assessment & Plan Note (Signed)
Established problem. Adequate glycemic control.  Pt is tolerating the current medication regiment. Continue current treatment plan.

## 2023-08-31 NOTE — Assessment & Plan Note (Signed)
Established problem. Adequate blood pressure control.  No evidence of new end organ damage.  Tolerating medication without significant adverse effects.  Plan to continue current blood pressure medication regiment.   

## 2023-09-19 ENCOUNTER — Encounter: Payer: Self-pay | Admitting: Family Medicine

## 2023-09-19 DIAGNOSIS — G2581 Restless legs syndrome: Secondary | ICD-10-CM

## 2023-09-19 DIAGNOSIS — G894 Chronic pain syndrome: Secondary | ICD-10-CM

## 2023-09-19 DIAGNOSIS — M48061 Spinal stenosis, lumbar region without neurogenic claudication: Secondary | ICD-10-CM

## 2023-09-19 DIAGNOSIS — M47896 Other spondylosis, lumbar region: Secondary | ICD-10-CM

## 2023-09-19 DIAGNOSIS — M160 Bilateral primary osteoarthritis of hip: Secondary | ICD-10-CM

## 2023-09-20 MED ORDER — MORPHINE SULFATE ER 15 MG PO TBCR: 15.0000 mg | EXTENDED_RELEASE_TABLET | Freq: Two times a day (BID) | ORAL | 0 refills | Status: DC

## 2023-09-20 MED ORDER — HYDROCODONE-ACETAMINOPHEN 10-325 MG PO TABS: 1.0000 | ORAL_TABLET | Freq: Four times a day (QID) | ORAL | 0 refills | Status: DC | PRN

## 2023-09-22 ENCOUNTER — Other Ambulatory Visit: Payer: Self-pay | Admitting: Family Medicine

## 2023-09-22 DIAGNOSIS — I10 Essential (primary) hypertension: Secondary | ICD-10-CM

## 2023-09-24 ENCOUNTER — Other Ambulatory Visit: Payer: Self-pay | Admitting: Family Medicine

## 2023-09-24 DIAGNOSIS — E785 Hyperlipidemia, unspecified: Secondary | ICD-10-CM

## 2023-09-24 DIAGNOSIS — K219 Gastro-esophageal reflux disease without esophagitis: Secondary | ICD-10-CM

## 2023-10-17 ENCOUNTER — Encounter: Payer: Self-pay | Admitting: Family Medicine

## 2023-10-17 DIAGNOSIS — M48061 Spinal stenosis, lumbar region without neurogenic claudication: Secondary | ICD-10-CM

## 2023-10-17 DIAGNOSIS — K589 Irritable bowel syndrome without diarrhea: Secondary | ICD-10-CM

## 2023-10-17 DIAGNOSIS — G894 Chronic pain syndrome: Secondary | ICD-10-CM

## 2023-10-17 DIAGNOSIS — M160 Bilateral primary osteoarthritis of hip: Secondary | ICD-10-CM

## 2023-10-18 ENCOUNTER — Ambulatory Visit: Payer: PPO

## 2023-10-18 ENCOUNTER — Ambulatory Visit
Admission: EM | Admit: 2023-10-18 | Discharge: 2023-10-18 | Disposition: A | Discharge status: Home / Self Care | Payer: PPO | Attending: Internal Medicine | Admitting: Internal Medicine

## 2023-10-18 DIAGNOSIS — F1190 Opioid use, unspecified, uncomplicated: Secondary | ICD-10-CM

## 2023-10-18 DIAGNOSIS — K5900 Constipation, unspecified: Secondary | ICD-10-CM

## 2023-10-18 DIAGNOSIS — K582 Mixed irritable bowel syndrome: Secondary | ICD-10-CM

## 2023-10-18 DIAGNOSIS — R140 Abdominal distension (gaseous): Secondary | ICD-10-CM

## 2023-10-18 MED ORDER — PROMETHAZINE HCL 25 MG PO TABS: 12.5000 mg | ORAL_TABLET | Freq: Three times a day (TID) | ORAL | 0 refills | Status: DC | PRN

## 2023-10-18 MED ORDER — MORPHINE SULFATE ER 15 MG PO TBCR: 15.0000 mg | EXTENDED_RELEASE_TABLET | Freq: Two times a day (BID) | ORAL | 0 refills | Status: DC

## 2023-10-18 MED ORDER — FLEET ENEMA RE ENEM
1.0000 | ENEMA | Freq: Every day | RECTAL | 1 refills | Status: DC | PRN
Start: 2023-10-18 — End: 2024-02-29

## 2023-10-18 NOTE — ED Triage Notes (Signed)
Pt c/o nausea, abd bloating and belching x 3 days-states " I feel like I might be constipated"-denies acute pain-NAD-steady gait with own cane

## 2023-10-18 NOTE — Telephone Encounter (Signed)
Prescription MS Contin sent.  New Prescription phenergan 25 m,g tab, 1/2 tab q 8 hr prn nausea & vomiting/  Stopped Compazine

## 2023-10-18 NOTE — ED Provider Notes (Signed)
[facility]  Note:  This document was prepared using Conservation officer, historic buildings and may include unintentional dictation errors.  MRN: [id number] DOB: 26-May-1951  Subjective:   [name] is a 72 y.o. female presenting for 3-day history of persistent abdominal bloating, abdominal fullness, constipation.  Has had nausea without vomiting.  Has been belching as well.  Patient has chronic opioid use, takes hydrocodone, morphine daily for chronic pain syndrome.  Last bowel movement was 4 days ago.  Was liquidy stool.  No blood.  No fever.  She is tolerating liquids and light meals still.  She does have a history of functional gastrointestinal disorder, IBS mixed type, GERD.  No current facility-administered medications for this encounter.  Current Outpatient Medications:    atorvastatin (LIPITOR) 20 MG tablet, TAKE 1 TABLET BY MOUTH EVERY DAY, Disp: 90 tablet, Rfl: 3   buPROPion ER (WELLBUTRIN SR) 100 MG 12 hr tablet, Take 100 mg by mouth 2 (two) times daily., Disp: , Rfl:    Calcium Carb-Cholecalciferol (CALCIUM-VITAMIN D) 600-400 MG-UNIT TABS, Take 1 tablet by mouth daily., Disp: , Rfl:    Carboxymethylcellulose Sodium (THERATEARS) 0.25 % SOLN, Place 1 drop into both eyes daily as needed (dry eyes)., Disp: , Rfl:    citalopram (CELEXA) 20 MG tablet, Take 20 mg by mouth at bedtime., Disp: , Rfl:    Continuous Glucose Sensor (FREESTYLE LIBRE 14 DAY SENSOR) MISC, APPLY EVERY 14 DAYS, Disp: 2 each, Rfl: 99   dicyclomine (BENTYL) 20 MG tablet, TAKE 1 TABLET BY MOUTH EVERY 8 HOURS AS NEEDED FOR SPASMS, Disp: 90 tablet, Rfl: 0   fexofenadine (ALLEGRA) 180 MG tablet, Take 180 mg by mouth daily., Disp: , Rfl:    fluticasone (FLONASE) 50 MCG/ACT nasal spray, Place 2 sprays into both nostrils daily., Disp: , Rfl:    hydrochlorothiazide (HYDRODIURIL) 25 MG tablet, TAKE 1 TABLET BY MOUTH EVERY DAY, Disp: 90 tablet, Rfl: 3   HYDROcodone-acetaminophen (NORCO) 10-325 MG  tablet, Take 1 tablet by mouth every 6 (six) hours as needed for moderate pain (pain score 4-6)., Disp: 52 tablet, Rfl: 0   hydrOXYzine (VISTARIL) 50 MG capsule, Take 100 mg by mouth at bedtime., Disp: , Rfl:    KLOR-CON M10 10 MEQ tablet, TAKE 4 TABLETS (40 MEQ TOTAL) BY MOUTH AT BEDTIME., Disp: 360 tablet, Rfl: 3   levothyroxine (SYNTHROID) 88 MCG tablet, TAKE 1 TABLET (88 MCG TOTAL) BY MOUTH EVERY MORNING. 30 MINUTES BEFORE FOOD, Disp: 90 tablet, Rfl: 1   loperamide (IMODIUM) 2 MG capsule, Take 1 capsule (2 mg total) by mouth as needed for diarrhea or loose stools., Disp: 30 capsule, Rfl: 5   LORazepam (ATIVAN) 0.5 MG tablet, Take 0.5 mg by mouth 3 (three) times daily as needed., Disp: , Rfl:    metoprolol tartrate (LOPRESSOR) 50 MG tablet, TAKE 1 TABLET BY MOUTH TWICE A DAY, Disp: 180 tablet, Rfl: 3   morphine (MS CONTIN) 15 MG 12 hr tablet, Take 1 tablet (15 mg total) by mouth every 12 (twelve) hours., Disp: 60 tablet, Rfl: 0   Multiple Vitamins-Minerals (MULTIVITAMIN WITH MINERALS) tablet, Take 1 tablet by mouth daily., Disp: , Rfl:    naloxone (NARCAN) nasal spray 4 mg/0.1 mL, PLEASE SEE ATTACHED FOR DETAILED DIRECTIONS, Disp: , Rfl:    omeprazole (PRILOSEC) 40 MG capsule, TAKE 1 CAPSULE BY MOUTH EVERY DAY, Disp: 90 capsule, Rfl: 3   polyethylene glycol powder (GLYCOLAX/MIRALAX) 17 GM/SCOOP powder, Take 17 g by mouth as needed for  mild constipation., Disp: , Rfl:    prazosin (MINIPRESS) 2 MG capsule, Take 2 mg by mouth at bedtime., Disp: , Rfl:    Probiotic Product (PROBIOTIC DAILY PO), Take 1 capsule by mouth daily., Disp: , Rfl:    promethazine (PHENERGAN) 25 MG tablet, Take 0.5 tablets (12.5 mg total) by mouth every 8 (eight) hours as needed for nausea or vomiting., Disp: 20 tablet, Rfl: 0   ramipril (ALTACE) 10 MG capsule, TAKE 1 CAPSULE BY MOUTH EVERY DAY, Disp: 90 capsule, Rfl: 3   senna-docusate (SENOKOT-S) 8.6-50 MG tablet, Take 2 tablets by mouth at bedtime. For AFTER surgery, do not  take if having diarrhea, Disp: 30 tablet, Rfl: 0   simethicone (MYLICON) 80 MG chewable tablet, Chew 160 mg by mouth daily as needed (gas)., Disp: , Rfl:    solifenacin (VESICARE) 5 MG tablet, TAKE 1 TABLET (5 MG TOTAL) BY MOUTH DAILY., Disp: 90 tablet, Rfl: 3   tiZANidine (ZANAFLEX) 2 MG tablet, Take 1 tablet (2 mg total) by mouth every 6 (six) hours as needed for muscle spasms. TAKE 1 TABLET BY MOUTH EVERYDAY AT BEDTIME, Disp: 120 tablet, Rfl: 5   traZODone (DESYREL) 50 MG tablet, Take 200 mg by mouth at bedtime., Disp: , Rfl:    vitamin B-12 (CYANOCOBALAMIN) 1000 MCG tablet, Take 1,000 mcg by mouth daily., Disp: , Rfl:    Allergies  Allergen Reactions   Diclofenac-Misoprostol Shortness Of Breath    elevated blood pressure   Aripiprazole     Doesn't want to take because of possible side effects   Emsam [Selegiline] Other (See Comments)    Burned the skin after using for a year   Estradiol Swelling   Ibuprofen Hives   Naproxen Nausea Only   Neurontin [Gabapentin] Hives and Swelling   Paroxetine Other (See Comments)    Panic attacks   Prednisone Other (See Comments)    Severe depression   Tape Other (See Comments)    Paper tape caused blisters, plastic tape ok    Past Medical History:  Diagnosis Date   Adrenal adenoma, left 12/02/2020   CT AP 11/16/20:  left adrenal mass measuring approximately 2.7 cm. This is minimally increased in size from prior study and is consistent with a benign adrenal adenoma.    Adrenal nodule (HCC) 04/08/2011   04/08/11 Abdominal CT showed Left adrenal nodule which is tachnically indeterminate but most likely an adenoma.  It is 1.7 cm and 42 HU on portal venous phase image. 31 HU on delayed images.   01/09/2012 Abdominal CT w/ & w/o CM: Stable benign left adrenal adenoma. No further specific follow-up is needed for this finding.     Allergic rhinitis 01/03/2007   Qualifier: Diagnosis of  By: Haydee Salter     Allergy    Anemia    hx of  in 20's   Anxiety     Bipolar disorder (HCC)    Carpal tunnel syndrome 02/14/2007   S/P carpel tunnel release bilaterally.     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 03/28/2018 03/28/2018   COPD WITHOUT EXACERBATION 01/03/2007   Qualifier: Diagnosis of  By: Haydee Salter  emphysema   Diabetes mellitus without complication (HCC)    A1C 6.5 as of 05-2019- diet changes, no meds currently    DISC WITH RADICULOPATHY 01/03/2007   Qualifier: Diagnosis of  By: Haydee Salter  Emphysema of lung (HCC)    Esophageal reflux 01/03/2007   Centricity Description: GASTROESOPHAGEAL REFLUX, NO ESOPHAGITIS Qualifier: Diagnosis of  By: Haydee Salter   Centricity Description: GERD Qualifier: Diagnosis of  By: Lelon Perla MD, [place]     External hemorrhoids s/p hemorrhoidectomy 03/28/2018 03/28/2018   Family history of breast cancer    Family history of cancer of female genital organ    Family history of colon cancer    Family history of gene mutation    BRIP1 gene mutation (c.2010dup) in daughter   Family history of lung cancer    Fibromyalgia    Fracture of bone spur of inferior portion of calcaneus 10/06/2018   Functional gastrointestinal disorder 06/18/2008   Functional Gastrointestinal Disorder with frequent eructation and bloating sensation with acute flares.  Responsive to Compazine and Bentyl.  Takes a few days to calm down.     GERD without esophagitis 01/03/2007   Centricity Description: GASTROESOPHAGEAL REFLUX, NO ESOPHAGITIS Qualifier: Diagnosis of  By: Haydee Salter   Centricity Description: GERD Qualifier: Diagnosis of  By: Lelon Perla MD, [place]     Hearing impairment 04/28/2021   Heel spur, fracture, left 04/17/2018   HEMORRHOIDS, NOS 01/03/2007   Qualifier: Diagnosis of  By: Haydee Salter     Hepatic steatosis 04/12/2011   Herpes simplex labialis 05/11/2011   Hip osteoarthritis  05/23/2007   MRI Pelvis (03/13/07) Bilateral osteoarthritis of the hips, left greater than right.  The  patient has a slightly more prominent hip effusion on the left than  the right.  No other significant abnormality.     History of MRSA infection 04/28/2011   HYPERCHOLESTEROLEMIA 12/24/2008   Hypertension    HYPERTENSION, BENIGN SYSTEMIC 01/03/2007   Qualifier: Diagnosis of  By: Haydee Salter     HYPERTRIGLYCERIDEMIA 11/11/2007   Qualifier: Diagnosis of  By: McDiarmid MD, Todd     Hypokalemia 12/12/2017   Hypothyroidism 02/15/2007   Qualifier: Diagnosis of  By: McDiarmid MD, Debara Pickett, BORDERLINE 02/15/2007   Insomnia 03/03/2021   Intra-abdominal adhesions    Irritable bowel syndrome 01/03/2007   Qualifier: Diagnosis of  By: Haydee Salter     Knee pain, acute 12/01/2022   LACTOSE INTOLERANCE 03/10/2008   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   Leukoplakia of oral cavity 09/20/2022   Major depressive disorder, recurrent episode (HCC) 01/03/2007   Qualifier: Diagnosis of  By: Haydee Salter     Medication management 12/14/2016   Managing topiramate for weight loss starting 12/14/16   Memory difficulties 03/04/2021   Monoallelic mutation of BRIP1 gene 01/17/2021   c.2010dup (p.[id number]*)   No impairment of memory 04/29/2021   Obesity (BMI 30.0-34.9) 09/30/2007   Has lost 55 lbs since easter. Goal of <190 by 08/05/12.      Obesity, Class III, BMI 40-49.9 (morbid obesity) (HCC) 09/30/2007        Osteoarthritis of spine 01/03/2007   MR I Lumbar (08/03): GeneralDDD; L2-3 Large  HNP.  Mod pos  hnp - 12/14/2005, smhnpw/irritL4root - 12/14/2005     OSTEOARTHRITIS, HANDS, BILATERAL 06/18/2008   Qualifier: Diagnosis of  By: McDiarmid MD, Clearance Coots FEMORAL STRESS SYNDROME 01/03/2007   Qualifier: Diagnosis of  By: Haydee Salter     Periodic limb movements of sleep 12/05/2010   Diagnosed on  Polysomnography testing Fall 2011.      Pneumonia    walking pneumonia in the 90's  Pre-diabetes 12/25/2008   Qualifier: Diagnosis of  By: McDiarmid MD, Todd     Prolapsed internal hemorrhoids, grade 3, s/p ligation/pexy/hemorrhoidectomy 03/28/2018 03/28/2018   RESTLESS LEGS SYNDROME 09/11/2007   Polysomnography (10/2010, Dr Fannie Knee, interpreter. ) showed periodic limb movements that frequently awoke patient from sleep with arousal index of 4 per hour. Dr Marcelyn Bruins (Sleep Specialist) thought her RLS was the major contributor to patient poor sleep condition, more than any sleep apnea.  He recommended considering opamine agonist (either requip or mirapex) to see if this will help.      Rosacea, acne 10/24/2011   SIGMOID POLYP 01/23/2008   Solitary lung nodule 04/08/2011   Stage 1 mild COPD by GOLD classification (HCC) 01/03/2007   11/28/2017 Chest CT: Mild centrilobular and paraseptal emphysema with mild diffuse bronchial wall thickening     Stress incontinence    Synovial cyst of lumbar facet joint 02/17/2016   Trigger thumb of left hand 08/16/2018   Urticaria, chronic 05/22/2011   VITAMIN D DEFICIENCY 03/03/2010   Work-related condition 05/17/2018     Past Surgical History:  Procedure Laterality Date   APPENDECTOMY     bladder tack  1998   Dr Annabell Howells (urology)   BUNIONECTOMY  1992   bilateral feet with pins in great toes   CARDIAC CATHETERIZATION     CARPAL TUNNEL RELEASE  2010   Bilateral, Dr Molly Maduro Sypher   COLONOSCOPY  2004   Dr Arty Baumgartner (GI)   CYSTOCELE REPAIR  1998    Dr Huntley Dec (GYN)-   ESOPHAGOGASTRODUODENOSCOPY     EVALUATION UNDER ANESTHESIA WITH HEMORRHOIDECTOMY N/A 03/28/2018   Procedure: ANORECTAL EXAM UNDER ANESTHESIA lateral internal partial sphincterotomy, hemorrhoidal ligation pixie, HEMORRHOIDECTOMY;  Surgeon: Karie Soda, MD;  Location: [facility];  Service: General;  Laterality: N/A;   EXAM ANORECTAL W/ ULTRASOUND     Dr. Michaell Cowing 03-28-18    EXPLORATORY  LAPAROTOMY     FACIAL LACERATIONS REPAIR     Facial Laceration repair as adolescent    INJECTION HIP INTRA ARTICULAR  2008   Left Hip fluoroscopically-guided corticosteorid injection for painful osteoarthritis with good effect (06/2007)   LAPAROSCOPIC LYSIS OF ADHESIONS  05/19/2021   Procedure: LAPAROSCOPIC LYSIS OF ADHESIONS; BIOPSY ABDOMINAL SKIN LESION;  Surgeon: Carver Fila, MD;  Location: [facility];  Service: Gynecology;;   LAPAROSCOPY FOR ECTOPIC PREGNANCY     LUMBAR LAMINECTOMY/DECOMPRESSION MICRODISCECTOMY N/A 06/21/2016   Procedure: MICRO LUMBAR DECOMPRESSION L4-L5 AND REMOVAL OF SYNOVIAL CYST    1 LEVEL;  Surgeon: Jene Every, MD;  Location: [facility];  Service: Orthopedics;  Laterality: N/A;   MRSA     NM MYOVIEW LTD  2011   Dr Verdis Prime, III (Card)   RECTOCELE REPAIR  1998   Dr Huntley Dec (GYN)   ROBOTIC ASSISTED SALPINGO OOPHERECTOMY N/A 05/19/2021   Procedure: XI ROBOTIC ASSISTED LEFT SALPINGO- OOPHORECTOMY WITH PELVIC WASHINGS;  Surgeon: Carver Fila, MD;  Location: [facility];  Service: Gynecology;  Laterality: N/A;   SPHINCTEROTOMY N/A 03/28/2018   Procedure: ANAL SPHINCTEROTOMY;  Surgeon: Karie Soda, MD;  Location: [facility];  Service: General;  Laterality: N/A;   UPPER GASTROINTESTINAL ENDOSCOPY      Family History  Problem Relation Age of Onset   Hypertension Mother    Frontotemporal dementia Mother    Diabetes Father    Heart disease Father    Hypertension Sister    Obesity Sister    Kidney disease Brother    Heart disease Brother  Hypertension Brother    Hypertension Daughter    Bipolar disorder Daughter    Hypertension Daughter    Breast cancer Daughter 87   Cancer Daughter 42       vulvar cancer   Other Daughter        BRIP1 gene mutation c.2010dup   Thyroid disease Daughter    Hypertension Son    Hypertension Son    Colon polyps Son 53   Diabetes Maternal Aunt    Lung cancer Paternal Aunt        d. >50   Cancer Paternal Aunt         unknown type, dx >50   Heart Problems Paternal Grandmother    Diabetes Paternal Grandfather    Cancer Cousin 17       gynecologic cancer NOS (paternal first cousin)   Cancer Niece 65       gynecologic cancer NOS    Cancer Paternal Great-grandmother        abdominal cancer NOS   Diabetes type II Other    Hypertension Other    Heart attack Other    Alcohol abuse Other    Depression Other    Asthma Other    Migraines Other    Breast cancer Other        paternal great-great aunt (PGF's aunt)   Esophageal cancer Neg Hx    Stomach cancer Neg Hx    Rectal cancer Neg Hx    Colon cancer Neg Hx    Prostate cancer Neg Hx    Pancreatic cancer Neg Hx     Social History   Tobacco Use   Smoking status: Former    Current packs/day: 0.00    Average packs/day: 1.5 packs/day for 44.0 years (66.0 ttl pk-yrs)    Types: Cigarettes    Start date: 04/06/1964    Quit date: 04/06/2008    Years since quitting: 15.5    Passive exposure: Past   Smokeless tobacco: Never  Vaping Use   Vaping status: Never Used  Substance Use Topics   Alcohol use: No   Drug use: Yes    Types: Marijuana    ROS   Objective:   Vitals: BP (!) 163/92 (BP Location: Left Wrist)   Pulse 72   Temp 98.4 F (36.9 C) (Oral)   Resp (!) 24   SpO2 96%   Physical Exam Constitutional:      General: She is not in acute distress.    Appearance: Normal appearance. She is well-developed. She is not ill-appearing, toxic-appearing or diaphoretic.  HENT:     Head: Normocephalic and atraumatic.     Nose: Nose normal.     Mouth/Throat:     Mouth: Mucous membranes are moist.  Eyes:     General: No scleral icterus.       Right eye: No discharge.        Left eye: No discharge.     Extraocular Movements: Extraocular movements intact.     Conjunctiva/sclera: Conjunctivae normal.  Cardiovascular:     Rate and Rhythm: Normal rate and regular rhythm.     Heart sounds: Normal heart sounds. No murmur heard.    No friction rub.  No gallop.  Pulmonary:     Effort: Pulmonary effort is normal. No respiratory distress.     Breath sounds: No stridor. No wheezing, rhonchi or rales.  Chest:     Chest wall: No tenderness.  Abdominal:     General: Bowel sounds are normal. There  is distension.     Palpations: Abdomen is soft. There is no mass.     Tenderness: There is abdominal tenderness. There is no right CVA tenderness, left CVA tenderness, guarding or rebound.  Skin:    General: Skin is warm and dry.  Neurological:     General: No focal deficit present.     Mental Status: She is alert and oriented to person, place, and time.  Psychiatric:        Mood and Affect: Mood normal.        Behavior: Behavior normal.        Thought Content: Thought content normal.        Judgment: Judgment normal.     Assessment and Plan :   PDMP not reviewed this encounter.  1. Constipation, unspecified constipation type   2. Irritable bowel syndrome with both constipation and diarrhea   3. Abdominal bloating   4. Chronic, continuous use of opioids    X-ray over-read was pending at time of discharge, recommended follow up with only abnormal results. Otherwise will not call for negative over-read. Patient was in agreement.  Suspect this is related to chronic opioid use.  Recommend conservative management.  Switch from MiraLAX to enema.  Follow-up with her GI specialist and chronic pain specialist.  Counseled patient on potential for adverse effects with medications prescribed/recommended today, ER and return-to-clinic precautions discussed, patient verbalized understanding.    Wallis Bamberg, PA-C 10/18/23 1222

## 2023-10-22 ENCOUNTER — Emergency Department (HOSPITAL_COMMUNITY): Payer: PPO

## 2023-10-22 ENCOUNTER — Encounter (HOSPITAL_COMMUNITY): Payer: Self-pay | Admitting: Emergency Medicine

## 2023-10-22 ENCOUNTER — Emergency Department (HOSPITAL_COMMUNITY)
Admission: EM | Admit: 2023-10-22 | Discharge: 2023-10-23 | Disposition: A | Discharge status: Home / Self Care | Payer: PPO | Attending: Emergency Medicine | Admitting: Emergency Medicine

## 2023-10-22 DIAGNOSIS — K5900 Constipation, unspecified: Secondary | ICD-10-CM | POA: Diagnosis not present

## 2023-10-22 DIAGNOSIS — E039 Hypothyroidism, unspecified: Secondary | ICD-10-CM | POA: Diagnosis not present

## 2023-10-22 DIAGNOSIS — F319 Bipolar disorder, unspecified: Secondary | ICD-10-CM | POA: Diagnosis not present

## 2023-10-22 DIAGNOSIS — I10 Essential (primary) hypertension: Secondary | ICD-10-CM | POA: Diagnosis not present

## 2023-10-22 DIAGNOSIS — R109 Unspecified abdominal pain: Secondary | ICD-10-CM | POA: Insufficient documentation

## 2023-10-22 DIAGNOSIS — K5903 Drug induced constipation: Secondary | ICD-10-CM | POA: Diagnosis not present

## 2023-10-22 DIAGNOSIS — E119 Type 2 diabetes mellitus without complications: Secondary | ICD-10-CM | POA: Diagnosis not present

## 2023-10-22 DIAGNOSIS — Z79899 Other long term (current) drug therapy: Secondary | ICD-10-CM | POA: Insufficient documentation

## 2023-10-22 DIAGNOSIS — J449 Chronic obstructive pulmonary disease, unspecified: Secondary | ICD-10-CM | POA: Diagnosis not present

## 2023-10-22 LAB — COMPREHENSIVE METABOLIC PANEL
ALT: 28 U/L (ref 0–44)
AST: 25 U/L (ref 15–41)
Albumin: 4.6 g/dL (ref 3.5–5.0)
Alkaline Phosphatase: 58 U/L (ref 38–126)
Anion gap: 10 (ref 5–15)
BUN: 8 mg/dL (ref 8–23)
CO2: 24 mmol/L (ref 22–32)
Calcium: 9.2 mg/dL (ref 8.9–10.3)
Chloride: 99 mmol/L (ref 98–111)
Creatinine, Ser: 0.89 mg/dL (ref 0.44–1.00)
GFR, Estimated: >60 mL/min (ref >60)
Glucose, Bld: 129 mg/dL — ABNORMAL HIGH (ref 70–99)
Potassium: 3.5 mmol/L (ref 3.5–5.1)
Sodium: 133 mmol/L — ABNORMAL LOW (ref 135–145)
Total Bilirubin: 0.7 mg/dL (ref <1.2)
Total Protein: 7.6 g/dL (ref 6.5–8.1)

## 2023-10-22 LAB — CBC WITH DIFFERENTIAL/PLATELET
Abs Immature Granulocytes: 0.04 10*3/uL (ref 0.00–0.07)
Basophils Absolute: 0 10*3/uL (ref 0.0–0.1)
Basophils Relative: 0 %
Eosinophils Absolute: 0.1 10*3/uL (ref 0.0–0.5)
Eosinophils Relative: 1 %
HCT: 44.8 % (ref 36.0–46.0)
Hemoglobin: 15 g/dL (ref 12.0–15.0)
Immature Granulocytes: 0 %
Lymphocytes Relative: 20 %
Lymphs Abs: 2 10*3/uL (ref 0.7–4.0)
MCH: 30.2 pg (ref 26.0–34.0)
MCHC: 33.5 g/dL (ref 30.0–36.0)
MCV: 90.3 fL (ref 80.0–100.0)
Monocytes Absolute: 1 10*3/uL (ref 0.1–1.0)
Monocytes Relative: 10 %
Neutro Abs: 6.9 10*3/uL (ref 1.7–7.7)
Neutrophils Relative %: 69 %
Platelets: 241 10*3/uL (ref 150–400)
RBC: 4.96 MIL/uL (ref 3.87–5.11)
RDW: 12.4 % (ref 11.5–15.5)
WBC: 10 10*3/uL (ref 4.0–10.5)
nRBC: 0 % (ref 0.0–0.2)

## 2023-10-22 LAB — I-STAT CHEM 8, ED
BUN: 6 mg/dL — ABNORMAL LOW (ref 8–23)
Calcium, Ion: 0.93 mmol/L — ABNORMAL LOW (ref 1.15–1.40)
Chloride: 102 mmol/L (ref 98–111)
Creatinine, Ser: 1 mg/dL (ref 0.44–1.00)
Glucose, Bld: 123 mg/dL — ABNORMAL HIGH (ref 70–99)
HCT: 44 % (ref 36.0–46.0)
Hemoglobin: 15 g/dL (ref 12.0–15.0)
Potassium: 3.7 mmol/L (ref 3.5–5.1)
Sodium: 133 mmol/L — ABNORMAL LOW (ref 135–145)
TCO2: 22 mmol/L (ref 22–32)

## 2023-10-22 MED ORDER — IOHEXOL 300 MG/ML  SOLN
100.0000 mL | Freq: Once | INTRAMUSCULAR | Status: AC | PRN
  Administered 2023-10-22: 100 mL via INTRAVENOUS

## 2023-10-22 NOTE — ED Triage Notes (Addendum)
Pt reports she has passed no stool for 7 days. SHe has given herself enema today and Saturday and been taking miralax and no relief. Chronic pain on hydrocodone and morphine but states she has not taken today. She denies belly pain and no tenderness to palpation. Belching. Hx of adhesions and appendectomy. Bowel sounds hypoactive but endorses passing little bit of flatulence.

## 2023-10-23 MED ORDER — MAGNESIUM CITRATE PO SOLN: 1.0000 | Freq: Once | ORAL | 1 refills | Status: AC

## 2023-10-23 NOTE — ED Notes (Signed)
Pt reported had bowel movement x2 while waiting in lobby

## 2023-10-23 NOTE — ED Notes (Signed)
Dtr called wanting update. Informed her will know more once MD sees her and will call with update as soon as we have new info.

## 2023-10-23 NOTE — ED Provider Notes (Signed)
[place] EMERGENCY DEPARTMENT AT [facility] Provider Note   CSN: [id number] Arrival date & time: 10/22/23  2127     History Chief Complaint  Patient presents with   Constipation    [name] is a 72 y.o. female.  Patient with past history significant for hypothyroidism, obesity, bipolar 2, hypertension, irritable bowel syndrome presents to the emergency department concerns of constipation.  He reports that she has not had a bowel movement in approximately 7 days.  On further questioning, she does report that she has had some looser type stools with the assistance of enemas.  She has been taking MiraLAX but was told by primary care provider to discontinue the MiraLAX and to use only enemas.  At this time, does not appear to have any focal abdominal pain but states that she has feelings of nausea but has not had any vomiting.  No recent fever, chills or bodyaches.  No history of bowel obstruction but does have prior abdominal surgeries.   Constipation      Home Medications Prior to Admission medications   Medication Sig Start Date End Date Taking? Authorizing Provider  magnesium citrate SOLN Take 296 mLs (1 Bottle total) by mouth once for 1 dose. 10/23/23 10/23/23 Yes Maryanna Shape A, PA-C  atorvastatin (LIPITOR) 20 MG tablet TAKE 1 TABLET BY MOUTH EVERY DAY 09/24/23   McDiarmid, Leighton Roach, MD  buPROPion ER [facility] SR) 100 MG 12 hr tablet Take 100 mg by mouth 2 (two) times daily. 05/18/21   [provider]  Calcium Carb-Cholecalciferol (CALCIUM-VITAMIN D) 600-400 MG-UNIT TABS Take 1 tablet by mouth daily.    [provider]  Carboxymethylcellulose Sodium (THERATEARS) 0.25 % SOLN Place 1 drop into both eyes daily as needed (dry eyes).    [provider]  citalopram (CELEXA) 20 MG tablet Take 20 mg by mouth at bedtime. 10/06/21   [provider]  Continuous Glucose Sensor (FREESTYLE LIBRE 14 DAY SENSOR) MISC APPLY EVERY 14 DAYS  04/30/23   McDiarmid, Leighton Roach, MD  dicyclomine (BENTYL) 20 MG tablet TAKE 1 TABLET BY MOUTH EVERY 8 HOURS AS NEEDED FOR SPASMS 05/07/23   McDiarmid, Leighton Roach, MD  fexofenadine (ALLEGRA) 180 MG tablet Take 180 mg by mouth daily.    [provider]  fluticasone (FLONASE) 50 MCG/ACT nasal spray Place 2 sprays into both nostrils daily.    [provider]  hydrochlorothiazide (HYDRODIURIL) 25 MG tablet TAKE 1 TABLET BY MOUTH EVERY DAY 01/26/23   McDiarmid, Leighton Roach, MD  HYDROcodone-acetaminophen (NORCO) 10-325 MG tablet Take 1 tablet by mouth every 6 (six) hours as needed for moderate pain (pain score 4-6). 09/20/23 10/20/23  McDiarmid, Leighton Roach, MD  hydrOXYzine (VISTARIL) 50 MG capsule Take 100 mg by mouth at bedtime. 09/02/15   [provider]  KLOR-CON M10 10 MEQ tablet TAKE 4 TABLETS (40 MEQ TOTAL) BY MOUTH AT BEDTIME. 01/03/23   McDiarmid, Leighton Roach, MD  levothyroxine (SYNTHROID) 88 MCG tablet TAKE 1 TABLET (88 MCG TOTAL) BY MOUTH EVERY MORNING. 30 MINUTES BEFORE FOOD 04/16/23   McDiarmid, Leighton Roach, MD  loperamide (IMODIUM) 2 MG capsule Take 1 capsule (2 mg total) by mouth as needed for diarrhea or loose stools. 12/07/22   McDiarmid, Leighton Roach, MD  LORazepam (ATIVAN) 0.5 MG tablet Take 0.5 mg by mouth 3 (three) times daily as needed. 01/22/22   [provider]  metoprolol tartrate (LOPRESSOR) 50 MG tablet TAKE 1 TABLET BY MOUTH TWICE A DAY 09/24/23  McDiarmid, Leighton Roach, MD  morphine (MS CONTIN) 15 MG 12 hr tablet Take 1 tablet (15 mg total) by mouth every 12 (twelve) hours. 10/18/23 11/17/23  McDiarmid, Leighton Roach, MD  Multiple Vitamins-Minerals (MULTIVITAMIN WITH MINERALS) tablet Take 1 tablet by mouth daily.    [provider]  naloxone [facility]) nasal spray 4 mg/0.1 mL PLEASE SEE ATTACHED FOR DETAILED DIRECTIONS 07/07/21   [provider]  omeprazole (PRILOSEC) 40 MG capsule TAKE 1 CAPSULE BY MOUTH EVERY DAY 09/24/23   McDiarmid, Leighton Roach, MD  polyethylene glycol powder  (GLYCOLAX/MIRALAX) 17 GM/SCOOP powder Take 17 g by mouth as needed for mild constipation.    [provider]  prazosin (MINIPRESS) 2 MG capsule Take 2 mg by mouth at bedtime. 02/18/21   [provider]  Probiotic Product (PROBIOTIC DAILY PO) Take 1 capsule by mouth daily.    [provider]  promethazine (PHENERGAN) 25 MG tablet Take 0.5 tablets (12.5 mg total) by mouth every 8 (eight) hours as needed for nausea or vomiting. 10/18/23   McDiarmid, Leighton Roach, MD  ramipril (ALTACE) 10 MG capsule TAKE 1 CAPSULE BY MOUTH EVERY DAY 12/12/22   McDiarmid, Leighton Roach, MD  senna-docusate (SENOKOT-S) 8.6-50 MG tablet Take 2 tablets by mouth at bedtime. For AFTER surgery, do not take if having diarrhea 05/10/21   Warner Mccreedy D, NP  simethicone (MYLICON) 80 MG chewable tablet Chew 160 mg by mouth daily as needed (gas).    [provider]  sodium phosphate (FLEET) ENEM Place 133 mLs (1 enema total) rectally daily as needed for severe constipation. 10/18/23   Wallis Bamberg, PA-C  solifenacin (VESICARE) 5 MG tablet TAKE 1 TABLET (5 MG TOTAL) BY MOUTH DAILY. 06/25/23 06/19/24  McDiarmid, Leighton Roach, MD  tiZANidine (ZANAFLEX) 2 MG tablet Take 1 tablet (2 mg total) by mouth every 6 (six) hours as needed for muscle spasms. TAKE 1 TABLET BY MOUTH EVERYDAY AT BEDTIME 07/19/23 01/15/24  McDiarmid, Leighton Roach, MD  traZODone (DESYREL) 50 MG tablet Take 200 mg by mouth at bedtime.    [provider]  vitamin B-12 (CYANOCOBALAMIN) 1000 MCG tablet Take 1,000 mcg by mouth daily.    [provider]      Allergies    Diclofenac-misoprostol, Aripiprazole, Emsam [selegiline], Estradiol, Ibuprofen, Naproxen, Neurontin [gabapentin], Paroxetine, Prednisone, and Tape    Review of Systems   Review of Systems  Gastrointestinal:  Positive for constipation.  All other systems reviewed and are negative.   Physical Exam Updated Vital Signs BP 106/75 (BP Location: Left Arm)   Pulse 87   Temp 98.7 F  (37.1 C) (Oral)   Resp 16   SpO2 91%  Physical Exam Vitals and nursing note reviewed.  Constitutional:      General: She is not in acute distress.    Appearance: She is well-developed.  HENT:     Head: Normocephalic and atraumatic.  Eyes:     Conjunctiva/sclera: Conjunctivae normal.  Cardiovascular:     Rate and Rhythm: Normal rate and regular rhythm.     Heart sounds: No murmur heard. Pulmonary:     Effort: Pulmonary effort is normal. No respiratory distress.     Breath sounds: Normal breath sounds.  Abdominal:     General: Abdomen is flat. Bowel sounds are normal. There is no distension.     Palpations: Abdomen is soft.     Tenderness: There is no abdominal tenderness. There is no guarding.  Musculoskeletal:  General: No swelling.     Cervical back: Neck supple.  Skin:    General: Skin is warm and dry.     Capillary Refill: Capillary refill takes less than 2 seconds.  Neurological:     Mental Status: She is alert.  Psychiatric:        Mood and Affect: Mood normal.     ED Results / Procedures / Treatments   Labs (all labs ordered are listed, but only abnormal results are displayed) Labs Reviewed  COMPREHENSIVE METABOLIC PANEL - Abnormal; Notable for the following components:      Result Value   Sodium 133 (*)    Glucose, Bld 129 (*)    All other components within normal limits  I-STAT CHEM 8, ED - Abnormal; Notable for the following components:   Sodium 133 (*)    BUN 6 (*)    Glucose, Bld 123 (*)    Calcium, Ion 0.93 (*)    All other components within normal limits  CBC WITH DIFFERENTIAL/PLATELET  URINALYSIS, ROUTINE W REFLEX MICROSCOPIC    EKG None  Radiology CT ABDOMEN PELVIS W CONTRAST Result Date: 10/23/2023 CLINICAL DATA:  Patient reports she has not passed stool for 7 weeks. MiraLax has not been providing relief. Chronic pain. EXAM: CT ABDOMEN AND PELVIS WITH CONTRAST TECHNIQUE: Multidetector CT imaging of the abdomen and pelvis was performed  using the standard protocol following bolus administration of intravenous contrast. RADIATION DOSE REDUCTION: This exam was performed according to the departmental dose-optimization program which includes automated exposure control, adjustment of the mA and/or kV according to patient size and/or use of iterative reconstruction technique. CONTRAST:  OMNIPAQUE IOHEXOL 300 MG/ML  SOLN COMPARISON:  04/06/2022 FINDINGS: Lower chest: No acute abnormality. Hepatobiliary: Hepatic steatosis. Gallbladder and biliary tree are unremarkable. Pancreas: Unremarkable. Spleen: Unremarkable. Adrenals/Urinary Tract: Stable adrenal glands. The left adrenal nodule is unchanged and previously characterized as an adenoma. No follow-up recommended. No urinary calculi or hydronephrosis. Unremarkable bladder. Stomach/Bowel: Normal caliber large and small bowel. No bowel wall thickening. Stomach is within normal limits. The appendix is not definitively visualized. Vascular/Lymphatic: Aortic atherosclerosis. No enlarged abdominal or pelvic lymph nodes. Reproductive: Hysterectomy. Other: No free intraperitoneal fluid or air. Musculoskeletal: No acute fracture. IMPRESSION: 1. No acute abnormality in the abdomen or pelvis. 2. Hepatic steatosis. Aortic Atherosclerosis (ICD10-I70.0). Electronically Signed   By: Minerva Fester M.D.   On: 10/23/2023 00:03    Procedures Procedures   Medications Ordered in ED Medications  iohexol (OMNIPAQUE) 300 MG/ML solution 100 mL (100 mLs Intravenous Contrast Given 10/22/23 2221)    ED Course/ Medical Decision Making/ A&P Clinical Course as of 10/23/23 0543  Tue Oct 23, 2023  0437 Sodium(!): 133 [OZ]    Clinical Course User Index [OZ] Smitty Knudsen, PA-C                               Medical Decision Making Amount and/or Complexity of Data Reviewed Labs: ordered. Decision-making details documented in ED Course.   This patient presents to the ED for concern of constipation.   Differential diagnosis includes idiopathic constipation, drug-induced constipation, bowel obstruction, diverticulitis   Imaging Studies ordered:  I ordered imaging studies including CT abdomen pelvis I independently visualized and interpreted imaging which showed no acute abnormality in the abdomen or pelvis. Hepatic steatosis. I agree with the radiologist interpretation   Problem List / ED Course:  Patient with past history significant for  hypothyroidism, obesity, bipolar 2 disorder, hypertension, irritable bowel syndrome presents the emergency department concerns of constipation.  She reports that it has been approximately 7 days since her last normal bowel movement.  She reports that she feels that she is having increasing straining and abdominal discomfort the longer she goes without a normal bowel movement.  Has been trying MiraLAX and enemas with some production of the bowel movement with the enemas although she reports not close to baseline.  Patient reports that she takes hydrocodone and morphine for chronic pain. She is still able to pass gas.  She is also burping.  Given concerns of possible obstruction, labs and CT imaging ordered from triage. Basic labs are unremarkable with no evidence of obvious infection or electrolyte disturbance.  CT imaging of the abdomen does not show any evidence of bowel obstruction or any other acute abdominal abnormality.  On exam, bowel sounds appear normal and there is no focal abdominal tenderness or guarding.  I do suspect patient likely does have some abdominal discomfort from some level of constipation although stool burden does not appear all that remarkable on CT imaging.  Highly doubt bowel obstruction.  Encourage patient continue using MiraLAX and enemas at home.  Advised patient to follow-up with PCP for further management.  Discussed return precautions.  A prescription was sent for magnesium citrate which patient can also pick up over-the-counter as an  attempt to manage her symptoms.  However, given that there is no significant constipation burden seen on CT imaging, do not believe that magnesium citrate will proved to be productive or effective.  Discussed again that patient should follow-up with gastroenterology for further evaluation management of symptoms.  No other acute or focal concerns at this time.  Patient discharged home in stable condition.  Final Clinical Impression(s) / ED Diagnoses Final diagnoses:  Abdominal discomfort    Rx / DC Orders ED Discharge Orders          Ordered    magnesium citrate SOLN   Once        10/23/23 0453              Smitty Knudsen, PA-C 10/23/23 0543    Tilden Fossa, MD 10/23/23 [phone]

## 2023-10-23 NOTE — Discharge Instructions (Addendum)
You are seen in the emergency department today with concerns of constipation.  Your CT imaging was negative for any bowel obstruction and there was no significant stool burden present.  I suspect your discomfort is due to opiate medication use.  Please discuss with your primary care provider/pain medicine provider for discussion regarding continued management of pain with improved management of side effects such as constipation.  I sent a prescription for magnesium citrate to your pharmacy to help aid in some of the discomfort that you are feeling.  Please continue to take MiraLAX, stay hydrated, and eat a diet high in fiber. If you are not eating much at all, this could be worsening your sensation as there will not be much for your body to metabolize. If new or worsening symptoms arise, please return to the ER.

## 2023-10-26 DIAGNOSIS — J449 Chronic obstructive pulmonary disease, unspecified: Secondary | ICD-10-CM | POA: Diagnosis not present

## 2023-10-26 DIAGNOSIS — K5903 Drug induced constipation: Secondary | ICD-10-CM | POA: Diagnosis not present

## 2023-11-17 ENCOUNTER — Other Ambulatory Visit: Payer: Self-pay | Admitting: Family Medicine

## 2023-11-17 DIAGNOSIS — K529 Noninfective gastroenteritis and colitis, unspecified: Secondary | ICD-10-CM

## 2023-11-17 DIAGNOSIS — K580 Irritable bowel syndrome with diarrhea: Secondary | ICD-10-CM

## 2023-11-18 ENCOUNTER — Encounter: Payer: Self-pay | Admitting: Family Medicine

## 2023-11-18 DIAGNOSIS — M48061 Spinal stenosis, lumbar region without neurogenic claudication: Secondary | ICD-10-CM

## 2023-11-18 DIAGNOSIS — G894 Chronic pain syndrome: Secondary | ICD-10-CM

## 2023-11-18 DIAGNOSIS — M160 Bilateral primary osteoarthritis of hip: Secondary | ICD-10-CM

## 2023-11-19 ENCOUNTER — Encounter: Payer: Self-pay | Admitting: Family Medicine

## 2023-11-19 ENCOUNTER — Other Ambulatory Visit (HOSPITAL_COMMUNITY): Payer: Self-pay

## 2023-11-19 DIAGNOSIS — G894 Chronic pain syndrome: Secondary | ICD-10-CM

## 2023-11-19 DIAGNOSIS — M48061 Spinal stenosis, lumbar region without neurogenic claudication: Secondary | ICD-10-CM

## 2023-11-19 DIAGNOSIS — G2581 Restless legs syndrome: Secondary | ICD-10-CM

## 2023-11-19 DIAGNOSIS — M47896 Other spondylosis, lumbar region: Secondary | ICD-10-CM

## 2023-11-19 DIAGNOSIS — M160 Bilateral primary osteoarthritis of hip: Secondary | ICD-10-CM

## 2023-11-19 MED ORDER — HYDROCODONE-ACETAMINOPHEN 10-325 MG PO TABS: 1.0000 | ORAL_TABLET | Freq: Four times a day (QID) | ORAL | 0 refills | Status: DC | PRN

## 2023-11-19 MED ORDER — MORPHINE SULFATE ER 15 MG PO TBCR: 15.0000 mg | EXTENDED_RELEASE_TABLET | Freq: Two times a day (BID) | ORAL | 0 refills | Status: DC

## 2023-11-19 MED ORDER — DICYCLOMINE HCL 20 MG PO TABS
20.0000 mg | ORAL_TABLET | Freq: Three times a day (TID) | ORAL | 0 refills | Status: DC | PRN
  Filled 2023-11-19: qty 90, 30d supply, fill #0

## 2023-11-19 NOTE — Telephone Encounter (Signed)
 Spoke with pharmacies and they report both medications are ready.   Patient has been updated.   No further action needed.

## 2023-11-26 ENCOUNTER — Other Ambulatory Visit: Payer: Self-pay | Admitting: Family Medicine

## 2023-11-26 DIAGNOSIS — E039 Hypothyroidism, unspecified: Secondary | ICD-10-CM

## 2023-11-29 ENCOUNTER — Encounter: Payer: Self-pay | Admitting: Family Medicine

## 2023-11-29 ENCOUNTER — Ambulatory Visit: Payer: PPO | Admitting: Family Medicine

## 2023-11-29 VITALS — BP 138/81 | HR 59 | Ht 64.0 in | Wt 235.4 lb

## 2023-11-29 DIAGNOSIS — R911 Solitary pulmonary nodule: Secondary | ICD-10-CM | POA: Diagnosis not present

## 2023-11-29 DIAGNOSIS — Z7984 Long term (current) use of oral hypoglycemic drugs: Secondary | ICD-10-CM

## 2023-11-29 DIAGNOSIS — G894 Chronic pain syndrome: Secondary | ICD-10-CM

## 2023-11-29 DIAGNOSIS — K589 Irritable bowel syndrome without diarrhea: Secondary | ICD-10-CM

## 2023-11-29 DIAGNOSIS — I152 Hypertension secondary to endocrine disorders: Secondary | ICD-10-CM | POA: Diagnosis not present

## 2023-11-29 DIAGNOSIS — K929 Disease of digestive system, unspecified: Secondary | ICD-10-CM

## 2023-11-29 DIAGNOSIS — E119 Type 2 diabetes mellitus without complications: Secondary | ICD-10-CM | POA: Diagnosis not present

## 2023-11-29 DIAGNOSIS — E1159 Type 2 diabetes mellitus with other circulatory complications: Secondary | ICD-10-CM

## 2023-11-29 DIAGNOSIS — Z79899 Other long term (current) drug therapy: Secondary | ICD-10-CM | POA: Diagnosis not present

## 2023-11-29 LAB — POCT GLYCOSYLATED HEMOGLOBIN (HGB A1C): HbA1c, POC (controlled diabetic range): 6.1 % (ref 0.0–7.0)

## 2023-11-29 MED ORDER — PROMETHAZINE HCL 25 MG PO TABS: 12.5000 mg | ORAL_TABLET | Freq: Three times a day (TID) | ORAL | 0 refills | Status: AC | PRN

## 2023-11-29 NOTE — Progress Notes (Signed)
[name] is alone Sources of clinical information for visit is/are patient. Nursing assessment for this office visit was reviewed with the patient for accuracy and revision.     Previous Report(s) Reviewed: UC visit note 10/18/23 and ED visit note 10/22/23. Reviewed CT AP report from ED visit    08/30/2023   11:19 AM  Depression screen PHQ 2/9  Decreased Interest 2  Down, Depressed, Hopeless 2  PHQ - 2 Score 4  Altered sleeping 2  Tired, decreased energy 3  Change in appetite 1  Feeling bad or failure about yourself  1  Trouble concentrating 3  Moving slowly or fidgety/restless 0  Suicidal thoughts 0  PHQ-9 Score 14   Flowsheet Row Office Visit from 08/30/2023 in [facility] - [facility] Dept Of [place]. [facility] Office Visit from 07/19/2023 in [facility] - A Dept Of [facility]. [facility] Clinical Support from 03/26/2023 in [facility] - A Dept Of [place]. [facility]  Thoughts that you would be better off dead, or of hurting yourself in some way Not at all Not at all Not at all  PHQ-9 Total Score 14 15 15           08/30/2023   11:19 AM 07/19/2023   10:47 AM 05/31/2023    2:07 PM 03/26/2023    2:19 PM 03/01/2023    1:41 PM  Fall Risk   Falls in the past year? 0 1 0 0 0  Number falls in past yr: 0 0 0 0 0  Injury with Fall? 0 0 0 0 0  Risk for fall due to :   No Fall Risks History of fall(s);Impaired mobility   Follow up    Falls prevention discussed;Education provided;Falls evaluation completed        08/30/2023   11:19 AM 07/19/2023   10:46 AM 03/26/2023    2:32 PM  PHQ9 SCORE ONLY  PHQ-9 Total Score 14 15 15     There are no preventive care reminders to display for this patient.  Health Maintenance Due  Topic Date Due   COVID-19 Vaccine (9 - 2024-25 season) 09/13/2023   DTaP/Tdap/Td (3 - Td or Tdap) 10/21/2023      History/P.E. limitations: none  There are no preventive care  reminders to display for this patient.  Diabetes Health Maintenance Due  Topic Date Due   HEMOGLOBIN A1C  02/28/2024   OPHTHALMOLOGY EXAM  07/17/2024    Health Maintenance Due  Topic Date Due   COVID-19 Vaccine (9 - 2024-25 season) 09/13/2023   DTaP/Tdap/Td (3 - Td or Tdap) 10/21/2023     No chief complaint on file.    --------------------------------------------------------------------------------------------------------------------------------------------- Visit Problem List with A/P  No problem-specific Assessment & Plan notes found for this encounter.

## 2023-11-29 NOTE — Patient Instructions (Signed)
    Tetanus Boost  It is time for your Tetanus booster.  The vaccination also contains a booster to prevent aults from giving whooping cough (pertussis) to babies.  If you think you will be around babies less than 6 months old in the next 10 years, I would recommend you getting the vaccination.  The vaccination is covered 100% by Medicare.  Please contact your pharmacy to have your tetanus booster vaccination.  You do not need a doctor's prescription for this vaccination.

## 2023-11-30 ENCOUNTER — Encounter: Payer: Self-pay | Admitting: Family Medicine

## 2023-11-30 NOTE — Assessment & Plan Note (Signed)
Established problem Well Controlled. Patient is at goal of < 6.5%. No signs of complications, medication side effects, or red flags. Continue lifestyle management

## 2023-11-30 NOTE — Assessment & Plan Note (Signed)
Established problem that has improved and has meet goal of adeqaute analgesia, ability to perform ADLs and iADLs, no no reported adverse effects. PDMP review without red flags.  Continue MS Contin 15 mg twice a day and decrease Norco 5/325 to one tablet at bedtime.  Plan to decrease number of Norco by two tabs per refill with goal of 30 tablets/month

## 2023-11-30 NOTE — Assessment & Plan Note (Signed)
Established problem Exacerbation of IBS mixed with belching and nausea.  Seen in ED 10/22/23 for these complaints.  CT AP was unremarkable.  Symptoms better treated with phenergan than compazine.   Changed Compazine to phenergan 25 mg po for exacerbations. Taking one Bentyl tablet daily.

## 2023-11-30 NOTE — Assessment & Plan Note (Signed)
Established problem Patient with nodule 5.8 mm on LD CT 07/23/23 with radiology recommendation to repeat LD CT in 6 months to assess nodule for changes.  LD CT ordered for mid-March.

## 2023-12-18 ENCOUNTER — Encounter: Payer: Self-pay | Admitting: Family Medicine

## 2023-12-18 DIAGNOSIS — M160 Bilateral primary osteoarthritis of hip: Secondary | ICD-10-CM

## 2023-12-18 DIAGNOSIS — M48061 Spinal stenosis, lumbar region without neurogenic claudication: Secondary | ICD-10-CM

## 2023-12-18 DIAGNOSIS — G894 Chronic pain syndrome: Secondary | ICD-10-CM

## 2023-12-18 NOTE — Telephone Encounter (Signed)
Medication pended to request.   Veronda Prude, RN

## 2023-12-19 MED ORDER — MORPHINE SULFATE ER 15 MG PO TBCR: 15.0000 mg | EXTENDED_RELEASE_TABLET | Freq: Two times a day (BID) | ORAL | 0 refills | Status: DC

## 2023-12-21 ENCOUNTER — Other Ambulatory Visit: Payer: Self-pay | Admitting: Family Medicine

## 2023-12-21 DIAGNOSIS — E876 Hypokalemia: Secondary | ICD-10-CM

## 2023-12-25 ENCOUNTER — Telehealth: Payer: Self-pay

## 2023-12-25 NOTE — Telephone Encounter (Signed)
 Patient calls nurse line requesting provider advice regarding COVID booster.   She last received COVID vaccine 07/19/2023. She was unsure if due to her having emphysema, if she would need another booster.   Will forward to PCP.   Veronda Prude, RN

## 2023-12-26 ENCOUNTER — Encounter: Payer: Self-pay | Admitting: Family Medicine

## 2023-12-26 NOTE — Telephone Encounter (Signed)
 Message sent to [name] that the CDC recommends a 2nd dose of the 2024-2025 Covid vaccine 6 months after the first 2024-2025 vaccination for people over age 74.  She is eligible for a second 2024-2025 Covid vaccination.  Recommended she schedule [facility] nurse visit for 2nd Covid vaccination.

## 2023-12-27 ENCOUNTER — Other Ambulatory Visit: Payer: Self-pay | Admitting: Family Medicine

## 2023-12-27 DIAGNOSIS — E1169 Type 2 diabetes mellitus with other specified complication: Secondary | ICD-10-CM

## 2023-12-28 ENCOUNTER — Other Ambulatory Visit: Payer: Self-pay | Admitting: Family Medicine

## 2023-12-28 DIAGNOSIS — I10 Essential (primary) hypertension: Secondary | ICD-10-CM

## 2023-12-28 NOTE — Telephone Encounter (Signed)
 Spoke with patient. Informed of note left by Dr. Perley Jain. Patient stated that she will wait til her April appt to get the 2nd COVID vaccine. Aquilla Solian, CMA

## 2024-01-10 DIAGNOSIS — J439 Emphysema, unspecified: Secondary | ICD-10-CM | POA: Insufficient documentation

## 2024-01-10 HISTORY — DX: Emphysema, unspecified: J43.9

## 2024-01-15 ENCOUNTER — Encounter: Payer: Self-pay | Admitting: Family Medicine

## 2024-01-15 DIAGNOSIS — M160 Bilateral primary osteoarthritis of hip: Secondary | ICD-10-CM

## 2024-01-15 DIAGNOSIS — G2581 Restless legs syndrome: Secondary | ICD-10-CM

## 2024-01-15 DIAGNOSIS — M47896 Other spondylosis, lumbar region: Secondary | ICD-10-CM

## 2024-01-15 DIAGNOSIS — G894 Chronic pain syndrome: Secondary | ICD-10-CM

## 2024-01-15 DIAGNOSIS — M48061 Spinal stenosis, lumbar region without neurogenic claudication: Secondary | ICD-10-CM

## 2024-01-16 MED ORDER — HYDROCODONE-ACETAMINOPHEN 10-325 MG PO TABS: 1.0000 | ORAL_TABLET | Freq: Four times a day (QID) | ORAL | 0 refills | Status: DC | PRN

## 2024-01-16 MED ORDER — MORPHINE SULFATE ER 15 MG PO TBCR: 15.0000 mg | EXTENDED_RELEASE_TABLET | Freq: Two times a day (BID) | ORAL | 0 refills | Status: DC

## 2024-01-16 NOTE — Telephone Encounter (Signed)
 Patient returns call to nurse line regarding rx refill.   She reports that she will run out of medication tonight and is requesting refill before our office closes for today.   Forwarding to PCP.   Veronda Prude, RN

## 2024-01-18 MED ORDER — MORPHINE SULFATE ER 15 MG PO TBCR: 15.0000 mg | EXTENDED_RELEASE_TABLET | Freq: Two times a day (BID) | ORAL | 0 refills | Status: DC

## 2024-01-18 NOTE — Telephone Encounter (Signed)
 Refills for HC-APAP 10-325 and MS Contine 15 mg, one month supply each, sent to [facility] on 01/16/24.

## 2024-01-18 NOTE — Telephone Encounter (Signed)
 Patient calls nurse line. [facility] is now out of morphine 15 mg. She is requesting that we [facility] [address].   Called and cancelled at [facility].   Spoke with Dr. Jennette Kettle as PCP is out of the office this afternoon.   [facility] [address].   Veronda Prude, RN

## 2024-01-21 ENCOUNTER — Ambulatory Visit
Admission: RE | Admit: 2024-01-21 | Discharge: 2024-01-21 | Disposition: A | Discharge status: Home / Self Care | Payer: PPO | Source: Ambulatory Visit | Attending: Acute Care | Admitting: Acute Care

## 2024-01-21 DIAGNOSIS — Z87891 Personal history of nicotine dependence: Secondary | ICD-10-CM | POA: Diagnosis not present

## 2024-01-21 DIAGNOSIS — R911 Solitary pulmonary nodule: Secondary | ICD-10-CM

## 2024-02-05 DIAGNOSIS — F4312 Post-traumatic stress disorder, chronic: Secondary | ICD-10-CM | POA: Diagnosis not present

## 2024-02-05 DIAGNOSIS — F3181 Bipolar II disorder: Secondary | ICD-10-CM | POA: Diagnosis not present

## 2024-02-06 ENCOUNTER — Encounter: Payer: Self-pay | Admitting: Family Medicine

## 2024-02-15 ENCOUNTER — Telehealth: Payer: Self-pay | Admitting: Acute Care

## 2024-02-15 NOTE — Telephone Encounter (Signed)
 I have attempted to call the patient with the results of their  Low Dose CT Chest Lung cancer screening scan. There was no answer. I have left a HIPPA compliant VM requesting the patient call the office for the scan results. I included the office contact information in the message. We will await his return call. If no return call we will continue to call until patient is contacted.    Pt. Will need PET scan and follow up within 1 week with myself or [place] to review the results. We will discuss with her when she returns our call.   Thanks so much

## 2024-02-15 NOTE — Telephone Encounter (Signed)
 Received call from Tiffany with [facility]. Patient had LDCT on 01/21/2024 that resulted today as a 4B. Results on [facility] dashboard. Will give results to Kandice Robinsons NP.   IMPRESSION: 1. Enlarging 13.2 mm irregular left lower lobe nodule. Lung-RADS 4B, suspicious. Additional imaging evaluation or consultation with Pulmonology or Thoracic Surgery recommended. These results will be called to the ordering clinician or representative by the Radiologist Assistant, and communication documented in the PACS or [employer]. 2. Hepatic steatosis. 3. Left adrenal adenoma. 4. Aortic atherosclerosis (ICD10-I70.0). Coronary artery calcification. 5.  Emphysema (ICD10-J43.9).     Electronically Signed   By: Leanna Battles M.D.   On: 02/15/2024 10:22

## 2024-02-16 ENCOUNTER — Other Ambulatory Visit: Payer: Self-pay | Admitting: Family Medicine

## 2024-02-16 DIAGNOSIS — K580 Irritable bowel syndrome with diarrhea: Secondary | ICD-10-CM

## 2024-02-16 DIAGNOSIS — K529 Noninfective gastroenteritis and colitis, unspecified: Secondary | ICD-10-CM

## 2024-02-17 ENCOUNTER — Encounter: Payer: Self-pay | Admitting: Family Medicine

## 2024-02-17 DIAGNOSIS — G2581 Restless legs syndrome: Secondary | ICD-10-CM

## 2024-02-17 DIAGNOSIS — M47896 Other spondylosis, lumbar region: Secondary | ICD-10-CM

## 2024-02-17 DIAGNOSIS — M160 Bilateral primary osteoarthritis of hip: Secondary | ICD-10-CM

## 2024-02-17 DIAGNOSIS — G894 Chronic pain syndrome: Secondary | ICD-10-CM

## 2024-02-17 DIAGNOSIS — M48061 Spinal stenosis, lumbar region without neurogenic claudication: Secondary | ICD-10-CM

## 2024-02-18 ENCOUNTER — Telehealth: Payer: Self-pay | Admitting: Family Medicine

## 2024-02-18 ENCOUNTER — Other Ambulatory Visit (HOSPITAL_BASED_OUTPATIENT_CLINIC_OR_DEPARTMENT_OTHER): Payer: Self-pay

## 2024-02-18 ENCOUNTER — Other Ambulatory Visit (HOSPITAL_COMMUNITY): Payer: Self-pay

## 2024-02-18 ENCOUNTER — Other Ambulatory Visit: Payer: Self-pay

## 2024-02-18 DIAGNOSIS — Z87891 Personal history of nicotine dependence: Secondary | ICD-10-CM

## 2024-02-18 DIAGNOSIS — Z122 Encounter for screening for malignant neoplasm of respiratory organs: Secondary | ICD-10-CM

## 2024-02-18 DIAGNOSIS — R911 Solitary pulmonary nodule: Secondary | ICD-10-CM

## 2024-02-18 MED ORDER — DICYCLOMINE HCL 20 MG PO TABS
20.0000 mg | ORAL_TABLET | Freq: Three times a day (TID) | ORAL | 1 refills | Status: DC | PRN
  Filled 2024-02-18: qty 90, 30d supply, fill #0
  Filled 2024-05-17: qty 90, 30d supply, fill #1

## 2024-02-18 MED ORDER — HYDROCODONE-ACETAMINOPHEN 10-325 MG PO TABS: 1.0000 | ORAL_TABLET | Freq: Four times a day (QID) | ORAL | 0 refills | Status: DC | PRN

## 2024-02-18 MED ORDER — MORPHINE SULFATE ER 15 MG PO TBCR: 15.0000 mg | EXTENDED_RELEASE_TABLET | Freq: Two times a day (BID) | ORAL | 0 refills | Status: DC

## 2024-02-18 NOTE — Telephone Encounter (Signed)
 See other phone note from 02/15/24. Will close this encounter.

## 2024-02-18 NOTE — Telephone Encounter (Signed)
 Spoke with patient and reviewed results below. She is in agreement to a PET scan due to enlarging 13.2 mm nodule, she prefers [name] and will f/u one week later for results. Reviewed hepatic steatosis, left adrenal adenoma, aortic atherosclerosis, coronary artery calcifications and emphysema. She is on statin therapy. PET order placed with one week follow up requested. Results and plan sent to PCP.   IMPRESSION: 1. Enlarging 13.2 mm irregular left lower lobe nodule. Lung-RADS 4B, suspicious. Additional imaging evaluation or consultation with Pulmonology or Thoracic Surgery recommended. These results will be called to the ordering clinician or representative by the Radiologist Assistant, and communication documented in the PACS or [employer]. 2. Hepatic steatosis. 3. Left adrenal adenoma. 4. Aortic atherosclerosis (ICD10-I70.0). Coronary artery calcification. 5.  Emphysema (ICD10-J43.9).

## 2024-02-18 NOTE — Telephone Encounter (Signed)
 I spoke with [name]. It sounds like she understands the next steps after the [id number].  Her back pain is better since she has picked up her Gabriella Contin 15 mg tablets, and HC-APAP tablets.   I will see Gabriella Ramires next week.

## 2024-02-18 NOTE — Telephone Encounter (Signed)
 Dr. McDiarmid,   I have pended both medications to preferred pharmacies.   Elsie Halo, RN

## 2024-02-25 ENCOUNTER — Ambulatory Visit (HOSPITAL_COMMUNITY)
Admission: RE | Admit: 2024-02-25 | Discharge: 2024-02-25 | Disposition: A | Discharge status: Home / Self Care | Source: Ambulatory Visit | Attending: Acute Care | Admitting: Acute Care

## 2024-02-25 DIAGNOSIS — K760 Fatty (change of) liver, not elsewhere classified: Secondary | ICD-10-CM | POA: Diagnosis not present

## 2024-02-25 DIAGNOSIS — Z87891 Personal history of nicotine dependence: Secondary | ICD-10-CM | POA: Insufficient documentation

## 2024-02-25 DIAGNOSIS — R918 Other nonspecific abnormal finding of lung field: Secondary | ICD-10-CM | POA: Diagnosis not present

## 2024-02-25 DIAGNOSIS — J90 Pleural effusion, not elsewhere classified: Secondary | ICD-10-CM | POA: Insufficient documentation

## 2024-02-25 DIAGNOSIS — Z122 Encounter for screening for malignant neoplasm of respiratory organs: Secondary | ICD-10-CM | POA: Insufficient documentation

## 2024-02-25 DIAGNOSIS — R911 Solitary pulmonary nodule: Secondary | ICD-10-CM | POA: Diagnosis present

## 2024-02-25 LAB — GLUCOSE, CAPILLARY: Glucose-Capillary: 167 mg/dL — ABNORMAL HIGH (ref 70–99)

## 2024-02-25 MED ORDER — FLUDEOXYGLUCOSE F - 18 (FDG) INJECTION
11.7500 | Freq: Once | INTRAVENOUS | Status: AC
  Administered 2024-02-25: 12.09 via INTRAVENOUS

## 2024-02-28 ENCOUNTER — Encounter: Payer: Self-pay | Admitting: Family Medicine

## 2024-02-28 ENCOUNTER — Ambulatory Visit (INDEPENDENT_AMBULATORY_CARE_PROVIDER_SITE_OTHER): Payer: PPO | Admitting: Family Medicine

## 2024-02-28 VITALS — BP 120/65 | HR 59 | Ht 64.0 in | Wt 243.2 lb

## 2024-02-28 DIAGNOSIS — Z23 Encounter for immunization: Secondary | ICD-10-CM | POA: Diagnosis not present

## 2024-02-28 DIAGNOSIS — E119 Type 2 diabetes mellitus without complications: Secondary | ICD-10-CM | POA: Diagnosis not present

## 2024-02-28 DIAGNOSIS — M545 Low back pain, unspecified: Secondary | ICD-10-CM | POA: Diagnosis not present

## 2024-02-28 DIAGNOSIS — J439 Emphysema, unspecified: Secondary | ICD-10-CM | POA: Diagnosis not present

## 2024-02-28 DIAGNOSIS — E1169 Type 2 diabetes mellitus with other specified complication: Secondary | ICD-10-CM

## 2024-02-28 DIAGNOSIS — I152 Hypertension secondary to endocrine disorders: Secondary | ICD-10-CM

## 2024-02-28 DIAGNOSIS — M25561 Pain in right knee: Secondary | ICD-10-CM | POA: Diagnosis not present

## 2024-02-28 DIAGNOSIS — Z7984 Long term (current) use of oral hypoglycemic drugs: Secondary | ICD-10-CM

## 2024-02-28 DIAGNOSIS — G8929 Other chronic pain: Secondary | ICD-10-CM

## 2024-02-28 DIAGNOSIS — I700 Atherosclerosis of aorta: Secondary | ICD-10-CM | POA: Diagnosis not present

## 2024-02-28 DIAGNOSIS — E1159 Type 2 diabetes mellitus with other circulatory complications: Secondary | ICD-10-CM | POA: Diagnosis not present

## 2024-02-28 DIAGNOSIS — G44219 Episodic tension-type headache, not intractable: Secondary | ICD-10-CM | POA: Diagnosis not present

## 2024-02-28 DIAGNOSIS — L739 Follicular disorder, unspecified: Secondary | ICD-10-CM

## 2024-02-28 DIAGNOSIS — M48061 Spinal stenosis, lumbar region without neurogenic claudication: Secondary | ICD-10-CM

## 2024-02-28 DIAGNOSIS — Z748 Other problems related to care provider dependency: Secondary | ICD-10-CM

## 2024-02-28 DIAGNOSIS — E785 Hyperlipidemia, unspecified: Secondary | ICD-10-CM | POA: Diagnosis not present

## 2024-02-28 NOTE — Progress Notes (Unsigned)
 [name] is {Pc accompanied by:5710} Sources of clinical information for visit is/are {Information source:60032}. Nursing assessment for this office visit was reviewed with the patient for accuracy and revision.     Previous Report(s) Reviewed: {Outside review:15817}     02/28/2024   10:44 AM  Depression screen PHQ 2/9  Decreased Interest 1  Down, Depressed, Hopeless 2  PHQ - 2 Score 3  Altered sleeping 2  Tired, decreased energy 3  Change in appetite 2  Feeling bad or failure about yourself  1  Trouble concentrating 2  Moving slowly or fidgety/restless 0  Suicidal thoughts 0  PHQ-9 Score 13  Difficult doing work/chores Very difficult   Flowsheet Row Office Visit from 02/28/2024 in [facility] - [facility] Dept Of [place]. [facility] Office Visit from 11/29/2023 in [facility] - [facility] Dept Of [place]. [facility] Office Visit from 08/30/2023 in [facility] - A Dept Of [place]. [facility]  Thoughts that you would be better off dead, or of hurting yourself in some way Not at all Not at all Not at all  PHQ-9 Total Score 13 14 14           02/28/2024   10:44 AM 11/29/2023    9:48 AM 08/30/2023   11:19 AM 07/19/2023   10:47 AM 05/31/2023    2:07 PM  Fall Risk   Falls in the past year? 0 0 0 1 0  Number falls in past yr: 0 0 0 0 0  Injury with Fall? 0 0 0 0 0  Risk for fall due to :     No Fall Risks       02/28/2024   10:44 AM 11/29/2023    9:51 AM 08/30/2023   11:19 AM  PHQ9 SCORE ONLY  PHQ-9 Total Score 13 14 14     There are no preventive care reminders to display for this patient.  Health Maintenance Due  Topic Date Due   DTaP/Tdap/Td (3 - Td or Tdap) 10/21/2023   Diabetic kidney evaluation - Urine ACR  02/29/2024   Medicare Annual Wellness (AWV)  03/25/2024      History/P.E. limitations: {exam; limitations ed:60112}  There are no preventive care reminders to display for this patient.   Diabetes Health Maintenance Due  Topic Date Due   HEMOGLOBIN A1C  05/28/2024   OPHTHALMOLOGY EXAM  07/17/2024    Health Maintenance Due  Topic Date Due   DTaP/Tdap/Td (3 - Td or Tdap) 10/21/2023   Diabetic kidney evaluation - Urine ACR  02/29/2024   Medicare Annual Wellness (AWV)  03/25/2024     Chief Complaint  Patient presents with   Medical Management of Chronic Issues     --------------------------------------------------------------------------------------------------------------------------------------------- Visit Problem List with A/P  No problem-specific Assessment & Plan notes found for this encounter.

## 2024-02-28 NOTE — Progress Notes (Unsigned)
 [name] is {Pc accompanied by:5710} Sources of clinical information for visit is/are {Information source:60032}. Nursing assessment for this office visit was reviewed with the patient for accuracy and revision.     Previous Report(s) Reviewed: {Outside review:15817}     02/28/2024   10:44 AM  Depression screen PHQ 2/9  Decreased Interest 1  Down, Depressed, Hopeless 2  PHQ - 2 Score 3  Altered sleeping 2  Tired, decreased energy 3  Change in appetite 2  Feeling bad or failure about yourself  1  Trouble concentrating 2  Moving slowly or fidgety/restless 0  Suicidal thoughts 0  PHQ-9 Score 13  Difficult doing work/chores Very difficult   Flowsheet Row Office Visit from 02/28/2024 in [facility] - [facility] Dept Of [place]. [facility] Office Visit from 11/29/2023 in [facility] - [facility] Dept [facility] [place] [facility]. [facility] Office Visit from 08/30/2023 in [facility] - [facility] Dept Of [place]. [facility]  Thoughts that you would be better off dead, or of hurting yourself in some way Not at all Not at all Not at all  PHQ-9 Total Score 13 14 14           02/28/2024   10:44 AM 11/29/2023    9:48 AM 08/30/2023   11:19 AM 07/19/2023   10:47 AM 05/31/2023    2:07 PM  Fall Risk   Falls in the past year? 0 0 0 1 0  Number falls in past yr: 0 0 0 0 0  Injury with Fall? 0 0 0 0 0  Risk for fall due to :     No Fall Risks       02/28/2024   10:44 AM 11/29/2023    9:51 AM 08/30/2023   11:19 AM  PHQ9 SCORE ONLY  PHQ-9 Total Score 13 14 14     There are no preventive care reminders to display for this patient.  Health Maintenance Due  Topic Date Due   DTaP/Tdap/Td (3 - Td or Tdap) 10/21/2023   Diabetic kidney evaluation - Urine ACR  02/29/2024   Medicare Annual Wellness (AWV)  03/25/2024      History/P.E. limitations: {exam; limitations ed:60112}  There are no preventive care reminders to display for this patient.   Diabetes Health Maintenance Due  Topic Date Due   HEMOGLOBIN A1C  05/28/2024   OPHTHALMOLOGY EXAM  07/17/2024    Health Maintenance Due  Topic Date Due   DTaP/Tdap/Td (3 - Td or Tdap) 10/21/2023   Diabetic kidney evaluation - Urine ACR  02/29/2024   Medicare Annual Wellness (AWV)  03/25/2024     Chief Complaint  Patient presents with   Medical Management of Chronic Issues     --------------------------------------------------------------------------------------------------------------------------------------------- Visit Problem List with A/P  No problem-specific Assessment & Plan notes found for this encounter.

## 2024-02-28 NOTE — Patient Instructions (Addendum)
 A referral to the PHysical Therapist office on [facility] to learn chair exercises for muscle pain in low back and buttocks.   Please go to [address] [facility] for [employer] of your knees.    It is okay to take ibuprofen 2 tablets every 6 hours as needed for your headaches. Take with food.   Apply antibiotic ointment to the infected hair follicles on your left lungs.  We are checking your cholesterol and urine protein today.   Contact [employer] for application to have rides for doctors' appointments.

## 2024-02-29 ENCOUNTER — Encounter: Payer: Self-pay | Admitting: Family Medicine

## 2024-02-29 DIAGNOSIS — L739 Follicular disorder, unspecified: Secondary | ICD-10-CM | POA: Insufficient documentation

## 2024-02-29 DIAGNOSIS — G44219 Episodic tension-type headache, not intractable: Secondary | ICD-10-CM | POA: Insufficient documentation

## 2024-02-29 HISTORY — DX: Episodic tension-type headache, not intractable: G44.219

## 2024-02-29 LAB — MICROALBUMIN / CREATININE URINE RATIO
Creatinine, Urine: 32.5 mg/dL
Microalb/Creat Ratio: <9 mg/g{creat} (ref 0–29)
Microalbumin, Urine: <3 ug/mL

## 2024-02-29 LAB — LIPID PANEL
Chol/HDL Ratio: 2.2 ratio (ref 0.0–4.4)
Cholesterol, Total: 138 mg/dL (ref 100–199)
HDL: 63 mg/dL (ref >39)
LDL Chol Calc (NIH): 55 mg/dL (ref 0–99)
Triglycerides: 110 mg/dL (ref 0–149)
VLDL Cholesterol Cal: 20 mg/dL (ref 5–40)

## 2024-02-29 NOTE — Assessment & Plan Note (Signed)
 Lipid Panel     Component Value Date/Time   CHOL 138 02/28/2024 1200   TRIG 110 02/28/2024 1200   HDL 63 02/28/2024 1200   CHOLHDL 2.2 02/28/2024 1200   CHOLHDL 4.3 12/14/2016 1231   VLDL 45 (H) 12/14/2016 1231   LDLCALC 55 02/28/2024 1200   LDLDIRECT 58 08/07/2019 1353   LDLDIRECT 78 07/22/2012 1206   LABVLDL 20 02/28/2024 1200    .Established problem Well Controlled. Patient is at goal of LDL < 100. No signs of complications, medication side effects, or red flags. Continue atorvastatin  20 mg daily

## 2024-02-29 NOTE — Assessment & Plan Note (Signed)
 Tender papules over right shin  Eyrhematous Papules on exam are around hair follicles  Assessment and Plan Likely folliculitis Recommend topical antibiotic oint twice a day until resolutiojn

## 2024-02-29 NOTE — Assessment & Plan Note (Signed)
 New osnet last weekband-like, pressure, no nausea & vomiting, no throbbing. Able to continue activity with headache.   Fearful of taking NSAID  Assessment and Plan  Episodic Tension-type headache  May use Ibuprofen prn

## 2024-02-29 NOTE — Assessment & Plan Note (Signed)
 Established problem. Stable. CGM readings all below 180 No signs of complications, medication side effects, or red flags. Continue dietary management

## 2024-02-29 NOTE — Assessment & Plan Note (Signed)
 Established problem worsened.  Location is in bilateral anterior knees, worse with walking No edema or erythema No joint locking or giving way\  Tender to palpation along lateral joint lines (+) Crepitus bilateral knees with Flex/Ext  Start: May use Ibuprofen prn  Bilateral standing 4V knee Xrays to look for OA and if present, it severity

## 2024-02-29 NOTE — Assessment & Plan Note (Signed)
 Established problem Well Controlled.  No signs of complications, medication side effects, or red flags. Continue current hydrochlorothiazide  25 daily, Ramipril  10 mg daily and metoprolol 

## 2024-02-29 NOTE — Assessment & Plan Note (Signed)
 Established problem with good control of hypertension, diabetes mellitus, and hyperlipidemia.

## 2024-02-29 NOTE — Assessment & Plan Note (Signed)
 Established problem worsened.  Patient is not at goal of adequate analgesic control to allow facile performance of ADLs and iADLs.   HPI Uses HC-APAP 10-325 #52/month as well as MS Contin  15 mg qhs  which is not  help her since worsening of her back pain.   No recalled recent trauma The pain is worse during the day and interfering with her ability to perform her ADLs and iADLS without taxing and considerable effort. It has become difficult to get out of the home without fatiguing herself from increase in pain.  She can still sleep but finding a position which eases pain enough to fall asleep has become more difficult  PSH significant L4-5 laminectomy and microdiscectomy in 2017 by Dr Leighton Punches (Ortho) Prior Studies No further lumbar spine imaging since 2017.   Patient would like to go for Physical Therapy for one session only to learn about chair exercises to alleviate the muscles spasms in back  A/ Exacerbation of Chronic back pain Referral to Physical Therapy  RTC 4 weeks to reassess - if referral back to Dr Leighton Punches (ortho)

## 2024-03-03 ENCOUNTER — Encounter: Payer: Self-pay | Admitting: Acute Care

## 2024-03-03 ENCOUNTER — Encounter: Payer: Self-pay | Admitting: Family Medicine

## 2024-03-03 ENCOUNTER — Encounter: Payer: Self-pay | Admitting: Emergency Medicine

## 2024-03-03 ENCOUNTER — Ambulatory Visit: Admitting: Acute Care

## 2024-03-03 VITALS — BP 147/78 | HR 62 | Ht 64.0 in | Wt 248.8 lb

## 2024-03-03 DIAGNOSIS — Z87891 Personal history of nicotine dependence: Secondary | ICD-10-CM | POA: Diagnosis not present

## 2024-03-03 DIAGNOSIS — R911 Solitary pulmonary nodule: Secondary | ICD-10-CM

## 2024-03-03 NOTE — H&P (View-Only) (Signed)
 History of Present Illness [name] is a 73 y.o. female former smoker , quit 2009 , followed by the lung cancer screening program.   03/03/2024 Pt. Presents for follow up after  PET scan. She is a patient followed through the lung cancer screening program, and she had an abnormal lung cancer screening scan 01/2024. This was read as a Lung Rads 4 B. We notified her off the findings by phone, and ordered a PET scan to better evaluate the finding. She states he has been doing well. She is not on oxygen.She does not use inhalers.   We have reviewed the results of the PET scan. There was low level hypermetabolism in the right lower lobe pulmonary lung nodule. There has been some progressive changes to the nodules. I offered the patient a 3 month follow up scan vs. Biopsy now. She lost her husband due to  Lung cancer, and she would prefer an aggressive approach with biopsy now.. We have reviewed the risks and benefits of the procedure, and she has given informed consent to proceed with bronchoscopy with biopsy.  She is not on blood thinners. She does take morphine  BID ( 15 mg slow release) and hydrocodone  on a regular basis at night.   Her daughter is with her today.She will be with her during the procedure.She has encouraged her mother to move forward with biopsy.   Test Results: 02/28/2024 PET No areas of abnormal radiotracer uptake.   The lung nodules are again seen and do not specifically have any abnormal uptake. Again there are 2 left lower lobe nodules which have been increasing in size over time. Options would include continued surveillance with CT in 3 months versus a more aggressive approach with sampling.   Tiny pleural effusions.  Fatty liver infiltration.     LDCT Chest 02/15/2024 IMPRESSION: 1. Enlarging 13.2 mm irregular left lower lobe nodule. Lung-RADS 4B, suspicious. Additional imaging evaluation or consultation with Pulmonology or Thoracic Surgery recommended. These  results will be called to the ordering clinician or representative by the Radiologist Assistant, and communication documented in the PACS or [employer]. 2. Hepatic steatosis. 3. Left adrenal adenoma. 4. Aortic atherosclerosis (ICD10-I70.0). Coronary artery calcification. 5.  Emphysema (ICD10-J43.9).        Electronically signed by Elwyn Hamper, RN at 02/18/2024 11:08 AM     Latest Ref Rng & Units 10/22/2023   10:01 PM 10/22/2023    9:52 PM 04/06/2022    7:34 PM  CBC  WBC 4.0 - 10.5 K/uL  10.0  15.1   Hemoglobin 12.0 - 15.0 g/dL 95.6  38.7  56.4   Hematocrit 36.0 - 46.0 % 44.0  44.8  43.1   Platelets 150 - 400 K/uL  241  329        Latest Ref Rng & Units 10/22/2023   10:01 PM 10/22/2023    9:52 PM 03/01/2023    5:36 PM  BMP  Glucose 70 - 99 mg/dL 332  951  884   BUN 8 - 23 mg/dL 6  8  10    Creatinine 0.44 - 1.00 mg/dL 1.66  0.63  0.16   BUN/Creat Ratio 12 - 28   14   Sodium 135 - 145 mmol/L 133  133  136   Potassium 3.5 - 5.1 mmol/L 3.7  3.5  4.5   Chloride 98 - 111 mmol/L 102  99  97   CO2 22 - 32 mmol/L  24  25   Calcium  8.9 -  10.3 mg/dL  9.2  65.7     BNP No results found for: "BNP"  ProBNP    Component Value Date/Time   PROBNP 121.9 07/28/2014 1623    PFT No results found for: "FEV1PRE", "FEV1POST", "FVCPRE", "FVCPOST", "TLC", "DLCOUNC", "PREFEV1FVCRT", "PSTFEV1FVCRT"  NM PET Image Initial (PI) Skull Base To Thigh Result Date: 02/28/2024 CLINICAL DATA:  Initial treatment strategy for lung nodule. EXAM: NUCLEAR MEDICINE PET SKULL BASE TO THIGH TECHNIQUE: 12.09 mCi F-18 FDG was injected intravenously. Full-ring PET imaging was performed from the skull base to thigh after the radiotracer. CT data was obtained and used for attenuation correction and anatomic localization. Fasting blood glucose: 167 mg/dl COMPARISON:  CT chest 84/69/6295 and older FINDINGS: Mediastinal blood pool activity: SUV max 3.6 Liver activity: SUV max 3.6 NECK: No specific abnormal  uptake seen in the neck including along lymph node change of the submandibular, posterior triangle or internal jugular region. Near symmetric uptake along the visualized intracranial compartment. Incidental CT findings: Streak artifact related to the patient's dental hardware. Visualized paranasal sinuses and mastoid air cells are clear. Hyperostosis frontalis interna. The parotid glands, submandibular glands are unremarkable. Small thyroid  gland. CHEST: No abnormal uptake seen above blood pool in the axillary regions, hilum or mediastinum. No abnormal lung uptake. On the prior CT scan was a 13 mm left lower lobe lung nodule just above the left hemidiaphragm and posterior, unchanged on series 4, image 88. No specific abnormal uptake in this location. The semi-solid ground-glass like nodule in the superior segment of the right lower lobe which previously measured 17.9 mm today has minimal uptake maximum SUV of 1.8. There is also a nodule in the left lower lobe more superiorly measuring 7 mm on image 72. In retrospect this was present on the prior CT scan from March 2025 and slightly progressive from of the remote study of 2023. Again no abnormal uptake. Incidental CT findings: Heart is nonenlarged. Thoracic aorta is normal course and caliber. There is scattered calcified plaque along the aorta and branch vessels including the coronary arteries. No significant pericardial effusion. Tiny bilateral pleural effusions. Breathing motion. ABDOMEN/PELVIS: There is physiologic distribution radiotracer along the parenchymal organs, bowel and renal collecting systems. Incidental CT findings: Fatty liver infiltration. Gallbladder is nondilated. Left adrenal low-attenuation lesion identified measuring 2.5 cm. Hounsfield its of a consistent with an adenoma. No significant abnormal uptake. Maximum SUV 4.0. This can be seen with adenomas. No renal or ureteral stones. Preserved contour to the urinary bladder. Bowel is nondilated.  Scattered vascular calcifications. SKELETON: No abnormal uptake along the visualized osseous structures. Scattered degenerative changes throughout the spine. Greatest at L4-5 but also significant areas in the lower cervical and lower thoracic spine. IMPRESSION: No areas of abnormal radiotracer uptake. The lung nodules are again seen and do not specifically have any abnormal uptake. Again there are 2 left lower lobe nodules which have been increasing in size over time. Options would include continued surveillance with CT in 3 months versus a more aggressive approach with sampling. Tiny pleural effusions.  Fatty liver infiltration. Electronically Signed   By: Adrianna Horde M.D.   On: 02/28/2024 15:24     Past medical hx Past Medical History:  Diagnosis Date   Adrenal adenoma, left 12/02/2020   CT AP 11/16/20:  left adrenal mass measuring approximately 2.7 cm. This is minimally increased in size from prior study and is consistent with a benign adrenal adenoma.    Adrenal nodule (HCC) 04/08/2011   04/08/11 Abdominal CT  showed Left adrenal nodule which is tachnically indeterminate but most likely an adenoma.  It is 1.7 cm and 42 HU on portal venous phase image. 31 HU on delayed images.   01/09/2012 Abdominal CT w/ & w/o CM: Stable benign left adrenal adenoma. No further specific follow-up is needed for this finding.     Allergic rhinitis 01/03/2007   Qualifier: Diagnosis of  By: Kivett, Whitney     Allergy    Anemia    hx of  in 20's   Anxiety    Bipolar disorder (HCC)    Carpal tunnel syndrome 02/14/2007   S/P carpel tunnel release bilaterally.     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 03/28/2018 03/28/2018   COPD WITHOUT EXACERBATION 01/03/2007   Qualifier: Diagnosis of  By: Eleni Griffin  emphysema   Degenerative joint disease of both hips 05/23/2007   CT AP 11/27/20: Thre are  degenerative changes of both hips     Diabetes mellitus without complication (HCC)    A1C 6.5 as of 05-2019- diet changes, no meds currently    DISC WITH RADICULOPATHY 01/03/2007   Qualifier: Diagnosis of  By: Eleni Griffin     Emphysema of lung (HCC)    Emphysema of Lung on CT 01/10/2024   Esophageal reflux 01/03/2007   Centricity Description: GASTROESOPHAGEAL REFLUX, NO ESOPHAGITIS Qualifier: Diagnosis of  By: Eleni Griffin   Centricity Description: GERD Qualifier: Diagnosis of  By: Marlane Silver MD, [place]     External hemorrhoids s/p hemorrhoidectomy 03/28/2018 03/28/2018   Facial pain 09/20/2022   Family history of breast cancer    Family history of cancer of female genital organ    Family history of colon cancer    Family history of gene mutation    BRIP1 gene mutation (c.2010dup) in daughter   Family history of lung cancer    Fibromyalgia    Fracture of bone spur of inferior portion of calcaneus 10/06/2018   Functional gastrointestinal disorder 06/18/2008   Functional Gastrointestinal Disorder with frequent eructation and bloating sensation with acute flares.  Responsive to Compazine  and Bentyl .  Takes a few days to calm down.     GERD without esophagitis 01/03/2007   Centricity Description: GASTROESOPHAGEAL REFLUX, NO ESOPHAGITIS Qualifier: Diagnosis of  By: Eleni Griffin   Centricity Description: GERD Qualifier: Diagnosis of  By: Marlane Silver MD, Weston     Hearing impairment 04/28/2021   Heel spur, fracture, left 04/17/2018   HEMORRHOIDS, NOS 01/03/2007   Qualifier: Diagnosis of  By: Eleni Griffin     Hepatic steatosis 04/12/2011   Herpes simplex labialis 05/11/2011   Hip osteoarthritis 05/23/2007   MRI Pelvis (03/13/07) Bilateral osteoarthritis of the hips, left greater than right.  The  patient has a slightly more prominent hip effusion on the left than  the right.  No other significant abnormality.     History of MRSA infection 04/28/2011   HYPERCHOLESTEROLEMIA 12/24/2008    Hypertension    HYPERTENSION, BENIGN SYSTEMIC 01/03/2007   Qualifier: Diagnosis of  By: Eleni Griffin     HYPERTRIGLYCERIDEMIA 11/11/2007   Qualifier: Diagnosis of  By: McDiarmid MD, Todd     Hypokalemia 12/12/2017   Hypothyroidism 02/15/2007   Qualifier: Diagnosis of  By: McDiarmid MD, Gaylin Ke, BORDERLINE 02/15/2007   Insomnia 03/03/2021   Intra-abdominal adhesions    Irritable bowel syndrome 01/03/2007   Qualifier: Diagnosis of  By: Kivett, Whitney     Knee pain, acute 12/01/2022   LACTOSE INTOLERANCE 03/10/2008   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   Leukoplakia of oral cavity 09/20/2022   Major depressive disorder, recurrent episode (HCC) 01/03/2007   Qualifier: Diagnosis of  By: Kivett, Whitney     Medication management 12/14/2016   Managing topiramate  for weight loss starting 12/14/16   Memory difficulties 03/04/2021   Metabolic syndrome 02/29/2012   Monoallelic mutation of BRIP1 gene 01/17/2021   c.2010dup (p.[id number]*)   NAFL (nonalcoholic fatty liver) 04/12/2011   Mild hepatic Steatosis on CT 04/08/11 for abdominal pain.      No impairment of memory 04/29/2021   Obesity (BMI 30.0-34.9) 09/30/2007   Has lost 55 lbs since easter. Goal of <190 by 08/05/12.      Obesity, Class III, BMI 40-49.9 (morbid obesity) (HCC) 09/30/2007        Osteoarthritis of spine 01/03/2007   MR I Lumbar (08/03): GeneralDDD; L2-3 Large  HNP.  Mod pos  hnp - 12/14/2005, smhnpw/irritL4root - 12/14/2005     OSTEOARTHRITIS, HANDS, BILATERAL 06/18/2008   Qualifier: Diagnosis of  By: McDiarmid MD, Alfornia Imam FEMORAL STRESS SYNDROME 01/03/2007   Qualifier: Diagnosis of  By: Eleni Griffin     Periodic limb movements of sleep 12/05/2010   Diagnosed on Polysomnography testing Fall 2011.      Pneumonia    walking pneumonia in the 90's   Pre-diabetes 12/25/2008   Qualifier: Diagnosis of  By: McDiarmid MD,  Todd     Prolapsed internal hemorrhoids, grade 3, s/p ligation/pexy/hemorrhoidectomy 03/28/2018 03/28/2018   Pulmonary nodules/lesions, multiple 03/02/2023   Pyriformis syndrome, right 03/02/2023   RESTLESS LEGS SYNDROME 09/11/2007   Polysomnography (10/2010, Dr Dianne Fortune, interpreter. ) showed periodic limb movements that frequently awoke patient from sleep with arousal index of 4 per hour. Dr Roma Close (Sleep Specialist) thought her RLS was the major contributor to patient poor sleep condition, more than any sleep apnea.  He recommended considering opamine agonist (either requip or mirapex) to see if this will help.      Rosacea, acne 10/24/2011   SIGMOID POLYP 01/23/2008   Solitary lung nodule 04/08/2011   Stage 1 mild COPD by GOLD classification (HCC) 01/03/2007   11/28/2017 Chest CT: Mild centrilobular and paraseptal emphysema with mild diffuse bronchial wall thickening     Stress incontinence    Synovial cyst of lumbar facet joint 02/17/2016   Trigger thumb of left hand 08/16/2018   Urticaria, chronic 05/22/2011   VITAMIN D  DEFICIENCY 03/03/2010   Work-related condition 05/17/2018     Social History   Tobacco Use   Smoking status: Former    Current packs/day: 0.00    Average packs/day: 1.5 packs/day for 44.0 years (66.0 ttl pk-yrs)    Types: Cigarettes    Start date: 04/06/1964    Quit date: 04/06/2008    Years since quitting: 15.9    Passive exposure: Past   Smokeless tobacco: Never  Vaping Use   Vaping status: Never Used  Substance Use Topics   Alcohol use: No   Drug use: Yes    Types: Marijuana    Ms.Marlo reports that she quit smoking about 15 years ago. Her smoking use included cigarettes. She started smoking about 59 years ago. She has a 66 pack-year smoking history. She has been exposed to tobacco smoke. She has never used smokeless tobacco. She reports current drug  use. Drug: Marijuana. She reports that she does not drink alcohol.  Tobacco Cessation: Counseling  given: Not Answered Former smoker, quit 2010 with a 66 pack year smoking history  Past surgical hx, Family hx, Social hx all reviewed.  Current Outpatient Medications on File Prior to Visit  Medication Sig   atorvastatin  (LIPITOR) 20 MG tablet TAKE 1 TABLET BY MOUTH EVERY DAY   buPROPion ER (WELLBUTRIN SR) 100 MG 12 hr tablet Take 150 mg by mouth 2 (two) times daily.   Calcium  Carb-Cholecalciferol (CALCIUM -VITAMIN D ) 600-400 MG-UNIT TABS Take 1 tablet by mouth daily.   citalopram  (CELEXA ) 20 MG tablet Take 20 mg by mouth at bedtime.   Continuous Glucose Sensor (FREESTYLE LIBRE 14 DAY SENSOR) MISC APPLY EVERY 14 DAYS   dicyclomine  (BENTYL ) 20 MG tablet Take 1 tablet (20 mg total) by mouth every 8 (eight) hours as needed for spasms   fexofenadine  (ALLEGRA ) 180 MG tablet Take 180 mg by mouth daily.   fluticasone (FLONASE) 50 MCG/ACT nasal spray Place 2 sprays into both nostrils daily.   hydrochlorothiazide  (HYDRODIURIL ) 25 MG tablet TAKE 1 TABLET BY MOUTH EVERY DAY   HYDROcodone -acetaminophen  (NORCO) 10-325 MG tablet Take 1 tablet by mouth every 6 (six) hours as needed for moderate pain (pain score 4-6).   KLOR-CON  M10 10 MEQ tablet TAKE 4 TABLETS (40 MEQ TOTAL) BY MOUTH AT BEDTIME.   levothyroxine  (SYNTHROID ) 88 MCG tablet TAKE 1 TABLET (88 MCG TOTAL) BY MOUTH EVERY MORNING. 30 MINUTES BEFORE FOOD   LORazepam  (ATIVAN ) 0.5 MG tablet Take 0.5 mg by mouth 3 (three) times daily as needed.   metoprolol  tartrate (LOPRESSOR ) 50 MG tablet TAKE 1 TABLET BY MOUTH TWICE A DAY   morphine  (MS CONTIN ) 15 MG 12 hr tablet Take 1 tablet (15 mg total) by mouth every 12 (twelve) hours.   Multiple Vitamins-Minerals (MULTIVITAMIN WITH MINERALS) tablet Take 1 tablet by mouth daily.   omeprazole  (PRILOSEC) 40 MG capsule TAKE 1 CAPSULE BY MOUTH EVERY DAY   polyethylene glycol powder (GLYCOLAX /MIRALAX ) 17 GM/SCOOP powder Take 17 g by mouth as needed for mild constipation.   prazosin  (MINIPRESS ) 2 MG capsule Take 2 mg  by mouth at bedtime.   Probiotic Product (PROBIOTIC DAILY PO) Take 1 capsule by mouth daily.   promethazine  (PHENERGAN ) 25 MG tablet Take 0.5 tablets (12.5 mg total) by mouth every 8 (eight) hours as needed for nausea or vomiting.   ramipril  (ALTACE ) 10 MG capsule TAKE 1 CAPSULE BY MOUTH EVERY DAY   senna-docusate (SENOKOT-S) 8.6-50 MG tablet Take 2 tablets by mouth at bedtime. For AFTER surgery, do not take if having diarrhea   solifenacin  (VESICARE ) 5 MG tablet TAKE 1 TABLET (5 MG TOTAL) BY MOUTH DAILY.   traZODone  (DESYREL ) 50 MG tablet Take 100 mg by mouth at bedtime.   vitamin B-12 (CYANOCOBALAMIN ) 1000 MCG tablet Take 1,000 mcg by mouth daily.   No current facility-administered medications on file prior to visit.     Allergies  Allergen Reactions   Diclofenac-Misoprostol Shortness Of Breath    elevated blood pressure   Aripiprazole     Doesn't want to take because of possible side effects   Emsam [Selegiline] Other (See Comments)    Burned the skin after using for a year   Estradiol Swelling   Ibuprofen Hives   Naproxen Nausea Only   Neurontin [Gabapentin] Hives and Swelling   Paroxetine Other (See Comments)    Panic attacks   Prednisone Other (See Comments)    Severe  depression   Tape Other (See Comments)    Paper tape caused blisters, plastic tape ok    Review Of Systems:  Constitutional:   No  weight loss, night sweats,  Fevers, chills, fatigue, or  lassitude.  HEENT:   No headaches,  Difficulty swallowing,  Tooth/dental problems, or  Sore throat,                No sneezing, itching, ear ache, nasal congestion, post nasal drip,   CV:  No chest pain,  Orthopnea, PND, swelling in lower extremities, anasarca, dizziness, palpitations, syncope.   GI  No heartburn, indigestion, abdominal pain, nausea, vomiting, diarrhea, change in bowel habits, loss of appetite, bloody stools.   Resp: + shortness of breath with exertion none  at rest.  No excess mucus, no productive  cough,  No non-productive cough,  No coughing up of blood.  No change in color of mucus.  No wheezing.  No chest wall deformity  Skin: no rash or lesions.  GU: no dysuria, change in color of urine, no urgency or frequency.  No flank pain, no hematuria   MS:  No joint pain or swelling.  No decreased range of motion.  No back pain.  Psych:  No change in mood or affect. No depression or anxiety.  No memory loss.   Vital Signs BP (!) 174/77 (BP Location: Left Wrist, Patient Position: Sitting, Cuff Size: Normal)   Pulse 62   Ht 5\' 4"  (1.626 m)   Wt 248 lb 12.8 oz (112.9 kg)   SpO2 95%   BMI 42.71 kg/m    Physical Exam:  General- No distress,  A&Ox3, pleasant ENT: No sinus tenderness, TM clear, pale nasal mucosa, no oral exudate,no post nasal drip, no LAN Cardiac: S1, S2, regular rate and rhythm, no murmur Chest: No wheeze/ rales/ dullness; no accessory muscle use, no nasal flaring, no sternal retractions Abd.: Soft Non-tender, ND, BS +, Body mass index is 42.71 kg/m.  Ext: No clubbing cyanosis, edema, no obvious deformities Neuro:  normal strength, MAE x 4, A&O x 3, appropriate Skin: No rashes, warm and dry, no obvious lesions  Psych: normal mood and behavior   Assessment/Plan Enlarging 13.2 mm irregular left lower lobe nodule. Lung-RADS 4B, suspicious. Former smoker quit 2010 Plan Your PET scan  showed low level intensity of the right lower lobe lung nodule. We discussed a 3 month follow up to watch the nodules , vs a biopsy now. You decided you would rather go ahead and have the biopsy vs. Watch and wait. I have placed an order for a bronchoscopy with biopsies.  We have discussed the procedure in detail.  We have reviewed the risks and benefits of the procedure. These include bleeding, infection, puncture of the lung, and adverse reaction to anesthesia. You have agreed to proceed with biopsy to evaluate the right lower lobe  nodule. Your procedure will be done by Dr.  Racheal Buddle. You will receive a letter today with date time and information pertaining to the procedure. You will need someone to drive you to the procedure, stay with you during the procedure, and stay with you after the procedure. You will also need someone to stay with you for 24 hours after anesthesia to ensure you have cleared and are doing well. You will follow-up with me 1 week after the procedure to review the results and to ensure you are doing well. Call if you need us  prior to the procedure or if you have  any questions at all. Please contact office for sooner follow up if symptoms do not improve or worsen or seek emergency care      I spent 38 minutes dedicated to the care of this patient on the date of this encounter to include pre-visit review of records, face-to-face time with the patient discussing conditions above, post visit ordering of testing, clinical documentation with the electronic health record, making appropriate referrals as documented, and communicating necessary information to the patient's healthcare team.    Raejean Bullock, NP 03/03/2024  2:14 PM

## 2024-03-03 NOTE — Patient Instructions (Addendum)
 It is good to see you today Your PET scan  showed low level intensity of the right lower lobe lung nodule. We discussed a 3 month follow up to watch the nodules , vs a biopsy now. You decided you would rather go ahead and have the biopsy vs. Watch and wait. I have placed an order for a bronchoscopy with biopsies.  We have discussed the procedure in detail.  We have reviewed the risks and benefits of the procedure. These include bleeding, infection, puncture of the lung, and adverse reaction to anesthesia. You have agreed to proceed with biopsy to evaluate the right lower lobe  nodule. Your procedure will be done by Dr. Racheal Buddle. You will receive a letter today with date time and information pertaining to the procedure. You will need someone to drive you to the procedure, stay with you during the procedure, and stay with you after the procedure. You will also need someone to stay with you for 24 hours after anesthesia to ensure you have cleared and are doing well. You will follow-up with me 1 week after the procedure to review the results and to ensure you are doing well. Call if you need us  prior to the procedure or if you have any questions at all. Please contact office for sooner follow up if symptoms do not improve or worsen or seek emergency care

## 2024-03-03 NOTE — Progress Notes (Signed)
 History of Present Illness [name] is a 73 y.o. female former smoker , quit 2009 , followed by the lung cancer screening program.   03/03/2024 Pt. Presents for follow up after  PET scan. She is a patient followed through the lung cancer screening program, and she had an abnormal lung cancer screening scan 01/2024. This was read as a Lung Rads 4 B. We notified her off the findings by phone, and ordered a PET scan to better evaluate the finding. She states he has been doing well. She is not on oxygen.She does not use inhalers.   We have reviewed the results of the PET scan. There was low level hypermetabolism in the right lower lobe pulmonary lung nodule. There has been some progressive changes to the nodules. I offered the patient a 3 month follow up scan vs. Biopsy now. She lost her husband due to  Lung cancer, and she would prefer an aggressive approach with biopsy now.. We have reviewed the risks and benefits of the procedure, and she has given informed consent to proceed with bronchoscopy with biopsy.  She is not on blood thinners. She does take morphine  BID ( 15 mg slow release) and hydrocodone  on a regular basis at night.   Her daughter is with her today.She will be with her during the procedure.She has encouraged her mother to move forward with biopsy.   Test Results: 02/28/2024 PET No areas of abnormal radiotracer uptake.   The lung nodules are again seen and do not specifically have any abnormal uptake. Again there are 2 left lower lobe nodules which have been increasing in size over time. Options would include continued surveillance with CT in 3 months versus a more aggressive approach with sampling.   Tiny pleural effusions.  Fatty liver infiltration.     LDCT Chest 02/15/2024 IMPRESSION: 1. Enlarging 13.2 mm irregular left lower lobe nodule. Lung-RADS 4B, suspicious. Additional imaging evaluation or consultation with Pulmonology or Thoracic Surgery recommended. These  results will be called to the ordering clinician or representative by the Radiologist Assistant, and communication documented in the PACS or [employer]. 2. Hepatic steatosis. 3. Left adrenal adenoma. 4. Aortic atherosclerosis (ICD10-I70.0). Coronary artery calcification. 5.  Emphysema (ICD10-J43.9).        Electronically signed by Elwyn Hamper, RN at 02/18/2024 11:08 AM     Latest Ref Rng & Units 10/22/2023   10:01 PM 10/22/2023    9:52 PM 04/06/2022    7:34 PM  CBC  WBC 4.0 - 10.5 K/uL  10.0  15.1   Hemoglobin 12.0 - 15.0 g/dL 95.6  38.7  56.4   Hematocrit 36.0 - 46.0 % 44.0  44.8  43.1   Platelets 150 - 400 K/uL  241  329        Latest Ref Rng & Units 10/22/2023   10:01 PM 10/22/2023    9:52 PM 03/01/2023    5:36 PM  BMP  Glucose 70 - 99 mg/dL 332  951  884   BUN 8 - 23 mg/dL 6  8  10    Creatinine 0.44 - 1.00 mg/dL 1.66  0.63  0.16   BUN/Creat Ratio 12 - 28   14   Sodium 135 - 145 mmol/L 133  133  136   Potassium 3.5 - 5.1 mmol/L 3.7  3.5  4.5   Chloride 98 - 111 mmol/L 102  99  97   CO2 22 - 32 mmol/L  24  25   Calcium  8.9 -  10.3 mg/dL  9.2  65.7     BNP No results found for: "BNP"  ProBNP    Component Value Date/Time   PROBNP 121.9 07/28/2014 1623    PFT No results found for: "FEV1PRE", "FEV1POST", "FVCPRE", "FVCPOST", "TLC", "DLCOUNC", "PREFEV1FVCRT", "PSTFEV1FVCRT"  NM PET Image Initial (PI) Skull Base To Thigh Result Date: 02/28/2024 CLINICAL DATA:  Initial treatment strategy for lung nodule. EXAM: NUCLEAR MEDICINE PET SKULL BASE TO THIGH TECHNIQUE: 12.09 mCi F-18 FDG was injected intravenously. Full-ring PET imaging was performed from the skull base to thigh after the radiotracer. CT data was obtained and used for attenuation correction and anatomic localization. Fasting blood glucose: 167 mg/dl COMPARISON:  CT chest 84/69/6295 and older FINDINGS: Mediastinal blood pool activity: SUV max 3.6 Liver activity: SUV max 3.6 NECK: No specific abnormal  uptake seen in the neck including along lymph node change of the submandibular, posterior triangle or internal jugular region. Near symmetric uptake along the visualized intracranial compartment. Incidental CT findings: Streak artifact related to the patient's dental hardware. Visualized paranasal sinuses and mastoid air cells are clear. Hyperostosis frontalis interna. The parotid glands, submandibular glands are unremarkable. Small thyroid  gland. CHEST: No abnormal uptake seen above blood pool in the axillary regions, hilum or mediastinum. No abnormal lung uptake. On the prior CT scan was a 13 mm left lower lobe lung nodule just above the left hemidiaphragm and posterior, unchanged on series 4, image 88. No specific abnormal uptake in this location. The semi-solid ground-glass like nodule in the superior segment of the right lower lobe which previously measured 17.9 mm today has minimal uptake maximum SUV of 1.8. There is also a nodule in the left lower lobe more superiorly measuring 7 mm on image 72. In retrospect this was present on the prior CT scan from March 2025 and slightly progressive from of the remote study of 2023. Again no abnormal uptake. Incidental CT findings: Heart is nonenlarged. Thoracic aorta is normal course and caliber. There is scattered calcified plaque along the aorta and branch vessels including the coronary arteries. No significant pericardial effusion. Tiny bilateral pleural effusions. Breathing motion. ABDOMEN/PELVIS: There is physiologic distribution radiotracer along the parenchymal organs, bowel and renal collecting systems. Incidental CT findings: Fatty liver infiltration. Gallbladder is nondilated. Left adrenal low-attenuation lesion identified measuring 2.5 cm. Hounsfield its of a consistent with an adenoma. No significant abnormal uptake. Maximum SUV 4.0. This can be seen with adenomas. No renal or ureteral stones. Preserved contour to the urinary bladder. Bowel is nondilated.  Scattered vascular calcifications. SKELETON: No abnormal uptake along the visualized osseous structures. Scattered degenerative changes throughout the spine. Greatest at L4-5 but also significant areas in the lower cervical and lower thoracic spine. IMPRESSION: No areas of abnormal radiotracer uptake. The lung nodules are again seen and do not specifically have any abnormal uptake. Again there are 2 left lower lobe nodules which have been increasing in size over time. Options would include continued surveillance with CT in 3 months versus a more aggressive approach with sampling. Tiny pleural effusions.  Fatty liver infiltration. Electronically Signed   By: Adrianna Horde M.D.   On: 02/28/2024 15:24     Past medical hx Past Medical History:  Diagnosis Date   Adrenal adenoma, left 12/02/2020   CT AP 11/16/20:  left adrenal mass measuring approximately 2.7 cm. This is minimally increased in size from prior study and is consistent with a benign adrenal adenoma.    Adrenal nodule (HCC) 04/08/2011   04/08/11 Abdominal CT  showed Left adrenal nodule which is tachnically indeterminate but most likely an adenoma.  It is 1.7 cm and 42 HU on portal venous phase image. 31 HU on delayed images.   01/09/2012 Abdominal CT w/ & w/o CM: Stable benign left adrenal adenoma. No further specific follow-up is needed for this finding.     Allergic rhinitis 01/03/2007   Qualifier: Diagnosis of  By: Kivett, Whitney     Allergy    Anemia    hx of  in 20's   Anxiety    Bipolar disorder (HCC)    Carpal tunnel syndrome 02/14/2007   S/P carpel tunnel release bilaterally.     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 03/28/2018 03/28/2018   COPD WITHOUT EXACERBATION 01/03/2007   Qualifier: Diagnosis of  By: Eleni Griffin  emphysema   Degenerative joint disease of both hips 05/23/2007   CT AP 11/27/20: Thre are  degenerative changes of both hips     Diabetes mellitus without complication (HCC)    A1C 6.5 as of 05-2019- diet changes, no meds currently    DISC WITH RADICULOPATHY 01/03/2007   Qualifier: Diagnosis of  By: Eleni Griffin     Emphysema of lung (HCC)    Emphysema of Lung on CT 01/10/2024   Esophageal reflux 01/03/2007   Centricity Description: GASTROESOPHAGEAL REFLUX, NO ESOPHAGITIS Qualifier: Diagnosis of  By: Eleni Griffin   Centricity Description: GERD Qualifier: Diagnosis of  By: Marlane Silver MD, [place]     External hemorrhoids s/p hemorrhoidectomy 03/28/2018 03/28/2018   Facial pain 09/20/2022   Family history of breast cancer    Family history of cancer of female genital organ    Family history of colon cancer    Family history of gene mutation    BRIP1 gene mutation (c.2010dup) in daughter   Family history of lung cancer    Fibromyalgia    Fracture of bone spur of inferior portion of calcaneus 10/06/2018   Functional gastrointestinal disorder 06/18/2008   Functional Gastrointestinal Disorder with frequent eructation and bloating sensation with acute flares.  Responsive to Compazine  and Bentyl .  Takes a few days to calm down.     GERD without esophagitis 01/03/2007   Centricity Description: GASTROESOPHAGEAL REFLUX, NO ESOPHAGITIS Qualifier: Diagnosis of  By: Eleni Griffin   Centricity Description: GERD Qualifier: Diagnosis of  By: Marlane Silver MD, Weston     Hearing impairment 04/28/2021   Heel spur, fracture, left 04/17/2018   HEMORRHOIDS, NOS 01/03/2007   Qualifier: Diagnosis of  By: Eleni Griffin     Hepatic steatosis 04/12/2011   Herpes simplex labialis 05/11/2011   Hip osteoarthritis 05/23/2007   MRI Pelvis (03/13/07) Bilateral osteoarthritis of the hips, left greater than right.  The  patient has a slightly more prominent hip effusion on the left than  the right.  No other significant abnormality.     History of MRSA infection 04/28/2011   HYPERCHOLESTEROLEMIA 12/24/2008    Hypertension    HYPERTENSION, BENIGN SYSTEMIC 01/03/2007   Qualifier: Diagnosis of  By: Eleni Griffin     HYPERTRIGLYCERIDEMIA 11/11/2007   Qualifier: Diagnosis of  By: McDiarmid MD, Todd     Hypokalemia 12/12/2017   Hypothyroidism 02/15/2007   Qualifier: Diagnosis of  By: McDiarmid MD, Gaylin Ke, BORDERLINE 02/15/2007   Insomnia 03/03/2021   Intra-abdominal adhesions    Irritable bowel syndrome 01/03/2007   Qualifier: Diagnosis of  By: Kivett, Whitney     Knee pain, acute 12/01/2022   LACTOSE INTOLERANCE 03/10/2008   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   Leukoplakia of oral cavity 09/20/2022   Major depressive disorder, recurrent episode (HCC) 01/03/2007   Qualifier: Diagnosis of  By: Kivett, Whitney     Medication management 12/14/2016   Managing topiramate  for weight loss starting 12/14/16   Memory difficulties 03/04/2021   Metabolic syndrome 02/29/2012   Monoallelic mutation of BRIP1 gene 01/17/2021   c.2010dup (p.[id number]*)   NAFL (nonalcoholic fatty liver) 04/12/2011   Mild hepatic Steatosis on CT 04/08/11 for abdominal pain.      No impairment of memory 04/29/2021   Obesity (BMI 30.0-34.9) 09/30/2007   Has lost 55 lbs since easter. Goal of <190 by 08/05/12.      Obesity, Class III, BMI 40-49.9 (morbid obesity) (HCC) 09/30/2007        Osteoarthritis of spine 01/03/2007   MR I Lumbar (08/03): GeneralDDD; L2-3 Large  HNP.  Mod pos  hnp - 12/14/2005, smhnpw/irritL4root - 12/14/2005     OSTEOARTHRITIS, HANDS, BILATERAL 06/18/2008   Qualifier: Diagnosis of  By: McDiarmid MD, Alfornia Imam FEMORAL STRESS SYNDROME 01/03/2007   Qualifier: Diagnosis of  By: Eleni Griffin     Periodic limb movements of sleep 12/05/2010   Diagnosed on Polysomnography testing Fall 2011.      Pneumonia    walking pneumonia in the 90's   Pre-diabetes 12/25/2008   Qualifier: Diagnosis of  By: McDiarmid MD,  Todd     Prolapsed internal hemorrhoids, grade 3, s/p ligation/pexy/hemorrhoidectomy 03/28/2018 03/28/2018   Pulmonary nodules/lesions, multiple 03/02/2023   Pyriformis syndrome, right 03/02/2023   RESTLESS LEGS SYNDROME 09/11/2007   Polysomnography (10/2010, Dr Dianne Fortune, interpreter. ) showed periodic limb movements that frequently awoke patient from sleep with arousal index of 4 per hour. Dr Roma Close (Sleep Specialist) thought her RLS was the major contributor to patient poor sleep condition, more than any sleep apnea.  He recommended considering opamine agonist (either requip or mirapex) to see if this will help.      Rosacea, acne 10/24/2011   SIGMOID POLYP 01/23/2008   Solitary lung nodule 04/08/2011   Stage 1 mild COPD by GOLD classification (HCC) 01/03/2007   11/28/2017 Chest CT: Mild centrilobular and paraseptal emphysema with mild diffuse bronchial wall thickening     Stress incontinence    Synovial cyst of lumbar facet joint 02/17/2016   Trigger thumb of left hand 08/16/2018   Urticaria, chronic 05/22/2011   VITAMIN D  DEFICIENCY 03/03/2010   Work-related condition 05/17/2018     Social History   Tobacco Use   Smoking status: Former    Current packs/day: 0.00    Average packs/day: 1.5 packs/day for 44.0 years (66.0 ttl pk-yrs)    Types: Cigarettes    Start date: 04/06/1964    Quit date: 04/06/2008    Years since quitting: 15.9    Passive exposure: Past   Smokeless tobacco: Never  Vaping Use   Vaping status: Never Used  Substance Use Topics   Alcohol use: No   Drug use: Yes    Types: Marijuana    Ms.Marlo reports that she quit smoking about 15 years ago. Her smoking use included cigarettes. She started smoking about 59 years ago. She has a 66 pack-year smoking history. She has been exposed to tobacco smoke. She has never used smokeless tobacco. She reports current drug  use. Drug: Marijuana. She reports that she does not drink alcohol.  Tobacco Cessation: Counseling  given: Not Answered Former smoker, quit 2010 with a 66 pack year smoking history  Past surgical hx, Family hx, Social hx all reviewed.  Current Outpatient Medications on File Prior to Visit  Medication Sig   atorvastatin  (LIPITOR) 20 MG tablet TAKE 1 TABLET BY MOUTH EVERY DAY   buPROPion ER (WELLBUTRIN SR) 100 MG 12 hr tablet Take 150 mg by mouth 2 (two) times daily.   Calcium  Carb-Cholecalciferol (CALCIUM -VITAMIN D ) 600-400 MG-UNIT TABS Take 1 tablet by mouth daily.   citalopram  (CELEXA ) 20 MG tablet Take 20 mg by mouth at bedtime.   Continuous Glucose Sensor (FREESTYLE LIBRE 14 DAY SENSOR) MISC APPLY EVERY 14 DAYS   dicyclomine  (BENTYL ) 20 MG tablet Take 1 tablet (20 mg total) by mouth every 8 (eight) hours as needed for spasms   fexofenadine  (ALLEGRA ) 180 MG tablet Take 180 mg by mouth daily.   fluticasone (FLONASE) 50 MCG/ACT nasal spray Place 2 sprays into both nostrils daily.   hydrochlorothiazide  (HYDRODIURIL ) 25 MG tablet TAKE 1 TABLET BY MOUTH EVERY DAY   HYDROcodone -acetaminophen  (NORCO) 10-325 MG tablet Take 1 tablet by mouth every 6 (six) hours as needed for moderate pain (pain score 4-6).   KLOR-CON  M10 10 MEQ tablet TAKE 4 TABLETS (40 MEQ TOTAL) BY MOUTH AT BEDTIME.   levothyroxine  (SYNTHROID ) 88 MCG tablet TAKE 1 TABLET (88 MCG TOTAL) BY MOUTH EVERY MORNING. 30 MINUTES BEFORE FOOD   LORazepam  (ATIVAN ) 0.5 MG tablet Take 0.5 mg by mouth 3 (three) times daily as needed.   metoprolol  tartrate (LOPRESSOR ) 50 MG tablet TAKE 1 TABLET BY MOUTH TWICE A DAY   morphine  (MS CONTIN ) 15 MG 12 hr tablet Take 1 tablet (15 mg total) by mouth every 12 (twelve) hours.   Multiple Vitamins-Minerals (MULTIVITAMIN WITH MINERALS) tablet Take 1 tablet by mouth daily.   omeprazole  (PRILOSEC) 40 MG capsule TAKE 1 CAPSULE BY MOUTH EVERY DAY   polyethylene glycol powder (GLYCOLAX /MIRALAX ) 17 GM/SCOOP powder Take 17 g by mouth as needed for mild constipation.   prazosin  (MINIPRESS ) 2 MG capsule Take 2 mg  by mouth at bedtime.   Probiotic Product (PROBIOTIC DAILY PO) Take 1 capsule by mouth daily.   promethazine  (PHENERGAN ) 25 MG tablet Take 0.5 tablets (12.5 mg total) by mouth every 8 (eight) hours as needed for nausea or vomiting.   ramipril  (ALTACE ) 10 MG capsule TAKE 1 CAPSULE BY MOUTH EVERY DAY   senna-docusate (SENOKOT-S) 8.6-50 MG tablet Take 2 tablets by mouth at bedtime. For AFTER surgery, do not take if having diarrhea   solifenacin  (VESICARE ) 5 MG tablet TAKE 1 TABLET (5 MG TOTAL) BY MOUTH DAILY.   traZODone  (DESYREL ) 50 MG tablet Take 100 mg by mouth at bedtime.   vitamin B-12 (CYANOCOBALAMIN ) 1000 MCG tablet Take 1,000 mcg by mouth daily.   No current facility-administered medications on file prior to visit.     Allergies  Allergen Reactions   Diclofenac-Misoprostol Shortness Of Breath    elevated blood pressure   Aripiprazole     Doesn't want to take because of possible side effects   Emsam [Selegiline] Other (See Comments)    Burned the skin after using for a year   Estradiol Swelling   Ibuprofen Hives   Naproxen Nausea Only   Neurontin [Gabapentin] Hives and Swelling   Paroxetine Other (See Comments)    Panic attacks   Prednisone Other (See Comments)    Severe  depression   Tape Other (See Comments)    Paper tape caused blisters, plastic tape ok    Review Of Systems:  Constitutional:   No  weight loss, night sweats,  Fevers, chills, fatigue, or  lassitude.  HEENT:   No headaches,  Difficulty swallowing,  Tooth/dental problems, or  Sore throat,                No sneezing, itching, ear ache, nasal congestion, post nasal drip,   CV:  No chest pain,  Orthopnea, PND, swelling in lower extremities, anasarca, dizziness, palpitations, syncope.   GI  No heartburn, indigestion, abdominal pain, nausea, vomiting, diarrhea, change in bowel habits, loss of appetite, bloody stools.   Resp: + shortness of breath with exertion none  at rest.  No excess mucus, no productive  cough,  No non-productive cough,  No coughing up of blood.  No change in color of mucus.  No wheezing.  No chest wall deformity  Skin: no rash or lesions.  GU: no dysuria, change in color of urine, no urgency or frequency.  No flank pain, no hematuria   MS:  No joint pain or swelling.  No decreased range of motion.  No back pain.  Psych:  No change in mood or affect. No depression or anxiety.  No memory loss.   Vital Signs BP (!) 174/77 (BP Location: Left Wrist, Patient Position: Sitting, Cuff Size: Normal)   Pulse 62   Ht 5\' 4"  (1.626 m)   Wt 248 lb 12.8 oz (112.9 kg)   SpO2 95%   BMI 42.71 kg/m    Physical Exam:  General- No distress,  A&Ox3, pleasant ENT: No sinus tenderness, TM clear, pale nasal mucosa, no oral exudate,no post nasal drip, no LAN Cardiac: S1, S2, regular rate and rhythm, no murmur Chest: No wheeze/ rales/ dullness; no accessory muscle use, no nasal flaring, no sternal retractions Abd.: Soft Non-tender, ND, BS +, Body mass index is 42.71 kg/m.  Ext: No clubbing cyanosis, edema, no obvious deformities Neuro:  normal strength, MAE x 4, A&O x 3, appropriate Skin: No rashes, warm and dry, no obvious lesions  Psych: normal mood and behavior   Assessment/Plan Enlarging 13.2 mm irregular left lower lobe nodule. Lung-RADS 4B, suspicious. Former smoker quit 2010 Plan Your PET scan  showed low level intensity of the right lower lobe lung nodule. We discussed a 3 month follow up to watch the nodules , vs a biopsy now. You decided you would rather go ahead and have the biopsy vs. Watch and wait. I have placed an order for a bronchoscopy with biopsies.  We have discussed the procedure in detail.  We have reviewed the risks and benefits of the procedure. These include bleeding, infection, puncture of the lung, and adverse reaction to anesthesia. You have agreed to proceed with biopsy to evaluate the right lower lobe  nodule. Your procedure will be done by Dr.  Racheal Buddle. You will receive a letter today with date time and information pertaining to the procedure. You will need someone to drive you to the procedure, stay with you during the procedure, and stay with you after the procedure. You will also need someone to stay with you for 24 hours after anesthesia to ensure you have cleared and are doing well. You will follow-up with me 1 week after the procedure to review the results and to ensure you are doing well. Call if you need us  prior to the procedure or if you have  any questions at all. Please contact office for sooner follow up if symptoms do not improve or worsen or seek emergency care      I spent 38 minutes dedicated to the care of this patient on the date of this encounter to include pre-visit review of records, face-to-face time with the patient discussing conditions above, post visit ordering of testing, clinical documentation with the electronic health record, making appropriate referrals as documented, and communicating necessary information to the patient's healthcare team.    Raejean Bullock, NP 03/03/2024  2:14 PM

## 2024-03-12 NOTE — Therapy (Unsigned)
 OUTPATIENT PHYSICAL THERAPY THORACOLUMBAR EVALUATION   Patient Name: [name] MRN: [id number] DOB:[birth year], 73 y.o., female Today's Date: 03/13/2024  END OF SESSION:  PT End of Session - 03/13/24 1426     Visit Number 1    Date for PT Re-Evaluation 03/13/24    Authorization Type HTA    PT Start Time 1422    PT Stop Time 1503    PT Time Calculation (min) 41 min    Activity Tolerance Patient tolerated treatment well;Patient limited by pain    Behavior During Therapy [facility] for tasks assessed/performed              Past Medical History:  Diagnosis Date   Adrenal adenoma, left 12/02/2020   CT AP 11/16/20:  left adrenal mass measuring approximately 2.7 cm. This is minimally increased in size from prior study and is consistent with a benign adrenal adenoma.    Adrenal nodule (HCC) 04/08/2011   04/08/11 Abdominal CT showed Left adrenal nodule which is tachnically indeterminate but most likely an adenoma.  It is 1.7 cm and 42 HU on portal venous phase image. 31 HU on delayed images.   01/09/2012 Abdominal CT w/ & w/o CM: Stable benign left adrenal adenoma. No further specific follow-up is needed for this finding.     Allergic rhinitis 01/03/2007   Qualifier: Diagnosis of  By: Kivett, Whitney     Allergy    Anemia    hx of  in 20's   Anxiety    Bipolar disorder (HCC)    Carpal tunnel syndrome 02/14/2007   S/P carpel tunnel release bilaterally.     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 03/28/2018 03/28/2018   COPD WITHOUT EXACERBATION 01/03/2007   Qualifier: Diagnosis of  By: Eleni Griffin  emphysema   Degenerative joint disease of both hips 05/23/2007   CT AP 11/27/20: Thre are degenerative changes of both hips     Diabetes mellitus without complication (HCC)    A1C 6.5 as of 05-2019- diet changes, no meds currently    DISC WITH RADICULOPATHY 01/03/2007    Qualifier: Diagnosis of  By: Eleni Griffin     Emphysema of lung (HCC)    Emphysema of Lung on CT 01/10/2024   Esophageal reflux 01/03/2007   Centricity Description: GASTROESOPHAGEAL REFLUX, NO ESOPHAGITIS Qualifier: Diagnosis of  By: Eleni Griffin   Centricity Description: GERD Qualifier: Diagnosis of  By: Marlane Silver MD, Weston     External hemorrhoids s/p hemorrhoidectomy 03/28/2018 03/28/2018   Facial pain 09/20/2022   Family history of breast cancer    Family history of cancer of female genital organ    Family history of colon cancer    Family history of gene mutation    BRIP1 gene mutation (c.2010dup) in daughter   Family history of lung cancer    Fibromyalgia    Fracture of bone spur of inferior portion of calcaneus 10/06/2018   Functional gastrointestinal disorder 06/18/2008   Functional Gastrointestinal Disorder with frequent eructation and bloating sensation with acute flares.  Responsive to Compazine  and Bentyl .  Takes a few days to calm down.     GERD without esophagitis 01/03/2007   Centricity Description: GASTROESOPHAGEAL REFLUX, NO ESOPHAGITIS Qualifier: Diagnosis of  By: Eleni Griffin   Centricity Description: GERD Qualifier: Diagnosis of  By: Marlane Silver MD, Weston     Hearing impairment 04/28/2021   Heel spur, fracture, left  04/17/2018   HEMORRHOIDS, NOS 01/03/2007   Qualifier: Diagnosis of  By: Eleni Griffin     Hepatic steatosis 04/12/2011   Herpes simplex labialis 05/11/2011   Hip osteoarthritis 05/23/2007   MRI Pelvis (03/13/07) Bilateral osteoarthritis of the hips, left greater than right.  The  patient has a slightly more prominent hip effusion on the left than  the right.  No other significant abnormality.     History of MRSA infection 04/28/2011   HYPERCHOLESTEROLEMIA 12/24/2008   Hypertension    HYPERTENSION, BENIGN SYSTEMIC 01/03/2007   Qualifier: Diagnosis of  By: Eleni Griffin     HYPERTRIGLYCERIDEMIA 11/11/2007   Qualifier: Diagnosis of  By: McDiarmid  MD, Todd     Hypokalemia 12/12/2017   Hypothyroidism 02/15/2007   Qualifier: Diagnosis of  By: McDiarmid MD, Gaylin Ke, BORDERLINE 02/15/2007   Insomnia 03/03/2021   Intra-abdominal adhesions    Irritable bowel syndrome 01/03/2007   Qualifier: Diagnosis of  By: Eleni Griffin     Knee pain, acute 12/01/2022   LACTOSE INTOLERANCE 03/10/2008   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   Leukoplakia of oral cavity 09/20/2022   Major depressive disorder, recurrent episode (HCC) 01/03/2007   Qualifier: Diagnosis of  By: Kivett, Whitney     Medication management 12/14/2016   Managing topiramate  for weight loss starting 12/14/16   Memory difficulties 03/04/2021   Metabolic syndrome 02/29/2012   Monoallelic mutation of BRIP1 gene 01/17/2021   c.2010dup (p.[id number]*)   NAFL (nonalcoholic fatty liver) 04/12/2011   Mild hepatic Steatosis on CT 04/08/11 for abdominal pain.      No impairment of memory 04/29/2021   Obesity (BMI 30.0-34.9) 09/30/2007   Has lost 55 lbs since easter. Goal of <190 by 08/05/12.      Obesity, Class III, BMI 40-49.9 (morbid obesity) 09/30/2007        Osteoarthritis of spine 01/03/2007   MR I Lumbar (08/03): GeneralDDD; L2-3 Large  HNP.  Mod pos  hnp - 12/14/2005, smhnpw/irritL4root - 12/14/2005     OSTEOARTHRITIS, HANDS, BILATERAL 06/18/2008   Qualifier: Diagnosis of  By: McDiarmid MD, Alfornia Imam FEMORAL STRESS SYNDROME 01/03/2007   Qualifier: Diagnosis of  By: Eleni Griffin     Periodic limb movements of sleep 12/05/2010   Diagnosed on Polysomnography testing Fall 2011.      Pneumonia    walking pneumonia in the 90's   Pre-diabetes 12/25/2008   Qualifier: Diagnosis of  By: McDiarmid MD, Todd     Prolapsed internal hemorrhoids, grade 3, s/p ligation/pexy/hemorrhoidectomy 03/28/2018 03/28/2018   Pulmonary nodules/lesions, multiple 03/02/2023   Pyriformis syndrome, right  03/02/2023   RESTLESS LEGS SYNDROME 09/11/2007   Polysomnography (10/2010, Dr Dianne Fortune, interpreter. ) showed periodic limb movements that frequently awoke patient from sleep with arousal index of 4 per hour. Dr Roma Close (Sleep Specialist) thought her RLS was the major contributor to patient poor sleep condition, more than any sleep apnea.  He recommended considering opamine agonist (either requip or mirapex) to see if this will help.      Rosacea, acne 10/24/2011   SIGMOID POLYP 01/23/2008   Solitary lung nodule 04/08/2011   Stage 1 mild COPD by GOLD classification (HCC) 01/03/2007   11/28/2017 Chest CT: Mild centrilobular and paraseptal emphysema with mild diffuse bronchial wall thickening     Stress incontinence    Synovial cyst of lumbar facet joint 02/17/2016   Trigger thumb  of left hand 08/16/2018   Urticaria, chronic 05/22/2011   VITAMIN D  DEFICIENCY 03/03/2010   Work-related condition 05/17/2018   Past Surgical History:  Procedure Laterality Date   APPENDECTOMY     bladder tack  1998   Dr Inga Manges (urology)   BUNIONECTOMY  1992   bilateral feet with pins in great toes   CARDIAC CATHETERIZATION     CARPAL TUNNEL RELEASE  2010   Bilateral, Dr Porfirio Bristol Sypher   COLONOSCOPY  2004   Dr Helen Loa (GI)   CYSTOCELE REPAIR  1998    Dr Alix Aquas (GYN)-   ESOPHAGOGASTRODUODENOSCOPY     EVALUATION UNDER ANESTHESIA WITH HEMORRHOIDECTOMY N/A 03/28/2018   Procedure: ANORECTAL EXAM UNDER ANESTHESIA lateral internal partial sphincterotomy, hemorrhoidal ligation pixie, HEMORRHOIDECTOMY;  Surgeon: Candyce Champagne, MD;  Location: [facility];  Service: General;  Laterality: N/A;   EXAM ANORECTAL W/ ULTRASOUND     Dr. Hershell Lose 03-28-18    EXPLORATORY LAPAROTOMY     FACIAL LACERATIONS REPAIR     Facial Laceration repair as adolescent    INJECTION HIP INTRA ARTICULAR  2008   Left Hip fluoroscopically-guided corticosteorid injection for painful osteoarthritis with good effect (06/2007)   LAPAROSCOPIC LYSIS  OF ADHESIONS  05/19/2021   Procedure: LAPAROSCOPIC LYSIS OF ADHESIONS; BIOPSY ABDOMINAL SKIN LESION;  Surgeon: Suzi Essex, MD;  Location: [facility];  Service: Gynecology;;   LAPAROSCOPY FOR ECTOPIC PREGNANCY     LUMBAR LAMINECTOMY/DECOMPRESSION MICRODISCECTOMY N/A 06/21/2016   Procedure: MICRO LUMBAR DECOMPRESSION L4-L5 AND REMOVAL OF SYNOVIAL CYST    1 LEVEL;  Surgeon: Orvan Blanch, MD;  Location: [facility];  Service: Orthopedics;  Laterality: N/A;   MRSA     NM MYOVIEW  LTD  2011   Dr Kay Parson, III (Card)   RECTOCELE REPAIR  1998   Dr Alix Aquas (GYN)   ROBOTIC ASSISTED SALPINGO OOPHERECTOMY N/A 05/19/2021   Procedure: XI ROBOTIC ASSISTED LEFT SALPINGO- OOPHORECTOMY WITH PELVIC WASHINGS;  Surgeon: Suzi Essex, MD;  Location: [facility];  Service: Gynecology;  Laterality: N/A;   SPHINCTEROTOMY N/A 03/28/2018   Procedure: ANAL SPHINCTEROTOMY;  Surgeon: Candyce Champagne, MD;  Location: [facility];  Service: General;  Laterality: N/A;   UPPER GASTROINTESTINAL ENDOSCOPY     Patient Active Problem List   Diagnosis Date Noted   Episodic tension type headache 02/29/2024   Folliculitis, left shin 02/29/2024   Emphysema of Lung on CT 01/10/2024   Nodule of lower lobe of right lung 03/02/2023   Polypharmacy 04/29/2021   Adrenal adenoma, left 12/02/2020   Arteriosclerosis of abdominal aorta (HCC) 12/02/2020   Urinary incontinence, mixed 04/02/2020   Type 2 diabetes mellitus with obesity (HCC) 06/30/2019   Type 2 diabetes mellitus without complications (HCC) 05/22/2019   Coronary artery calcification seen on CT scan 12/03/2017   Spinal stenosis of lumbar region 06/21/2016   Encounter for chronic pain management 12/03/2014   Knee pain, bilateral 11/25/2013   Metabolic dysfunction-associated steatohepatitis (MASH) 04/12/2011    Class: Chronic   Vitamin D  deficiency 03/03/2010   History of tobacco abuse 11/10/2009   Hyperlipidemia associated with type 2 diabetes mellitus (HCC) 12/24/2008    Chronic pain syndrome 12/24/2008   Functional gastrointestinal disorder 06/18/2008   HYPERTRIGLYCERIDEMIA 11/11/2007   Obesity, Class III, BMI 40-49.9 (morbid obesity) 09/30/2007   RESTLESS LEGS SYNDROME 09/11/2007   Fibromyalgia syndrome 07/15/2007   Hypothyroidism 02/15/2007   Bipolar 2 disorder (HCC) 01/03/2007   Hypertension associated with diabetes (HCC) 01/03/2007   Allergic rhinitis 01/03/2007   GERD without  esophagitis 01/03/2007   Irritable bowel syndrome 01/03/2007   Osteoarthritis of spine 01/03/2007    PCP: McDiarmid, Demetra Filter, MD  REFERRING PROVIDER: McDiarmid, Demetra Filter, MD  REFERRING DIAG: M54.40 (ICD-10-CM) - Bilateral low back pain with sciatica, sciatica laterality unspecified, unspecified chronicity  Rationale for Evaluation and Treatment: Rehabilitation  THERAPY DIAG:  Other low back pain  Muscle weakness (generalized)  ONSET DATE: chronic  SUBJECTIVE:                                                                                                                                                                                           SUBJECTIVE STATEMENT: Pt here for a one time eval and HEP instruction. Describes a history of R sided low back and RLE pain/sciatica. Patient complains of inability to stand for > 5 min.  Min sensory changes in Rt LE.  No falls but does stagger.  Just got a new cane and needs it adjusted.   PERTINENT HISTORY:  Chronic pain syndrome Previous back surgery COPD chronic lung disease Established problem - Adequate chronic pain control Reduction in her monthly supply of MS Contin  15 mg to 30 tab, one tab daily Lung biopsy upcoming   PAIN:  Are you having pain? Yes: NPRS scale: 4/10 Pain location: R low back and RLE Pain description: ache Aggravating factors: prolonged positions and standing  Relieving factors: salonpas, meds (Morphine  included)  Standing and moving 6/10.  PRECAUTIONS: None  WEIGHT BEARING RESTRICTIONS:  No  FALLS:  Has patient fallen in last 6 months? No    OCCUPATION: retired  PLOF: Independent  PATIENT GOALS: To establish a HEP  NEXT MD VISIT: 3 months  OBJECTIVE:   DIAGNOSTIC FINDINGS:  none  PATIENT SURVEYS:  ODI , [birth year]  SCREENING FOR RED FLAGS: negative   MUSCLE LENGTH: Hamstrings: negative Thomas test: NT   POSTURE: rounded shoulders and forward head  Lumbar ROM  AROM eval  Flexion NT in standing, relieves pain in seated   Extension NT   Right lateral flexion Pain   Left lateral flexion Pain   Right rotation 50% limited with pain   Left rotation 25% with pain    (Blank rows = not tested)  LOWER EXTREMITY ROM:     Active  Right eval Left eval  Hip flexion 90d 90d  Hip extension    Hip abduction    Hip adduction    Hip internal rotation WNL WNL  Hip external rotation WNL WNL  Knee flexion WNL WNL   Knee extension    Ankle dorsiflexion    Ankle plantarflexion    Ankle inversion  Ankle eversion     (Blank rows = not tested)  LOWER EXTREMITY MMT:  deferred due to pain  MMT Right eval Left eval  Hip flexion 4- 4  Hip extension    Hip abduction    Hip adduction    Hip internal rotation    Hip external rotation    Knee flexion 4 5  Knee extension 4 5  Ankle dorsiflexion 4+ 5  Ankle plantarflexion    Ankle inversion    Ankle eversion     (Blank rows = not tested)  LUMBAR SPECIAL TESTS:  Straight leg raise test: Negative and Slump test: Negative  FUNCTIONAL TESTS:  Deferred due to 1 time visit  GAIT: Distance walked: 71ft x2 Assistive device utilized: Single point cane Level of assistance: Complete Independence Comments: slow antalgic gait  TODAY'S TREATMENT:                                                                                                                              DATE: Eval and HEP 03/13/24: Therapeutic activities:  Seated trunk/lumbar flexion using rolling stool  Seated long arc quad  Seated march  with green Thera-Band  Seated hamstring stretch  Sit to stand x 5 Seated piriformis figure 4 stretch  Supine lower trunk rotation Supine clam bilateral abdominal set Supine bridge Discussed doing the exercises in her bed as tolerated and developing a habit of daily movement.  Educated on the effect of exercise on mood    PATIENT EDUCATION:  Education details: Discussed eval findings, rehab rationale and POC home exercise program , see above person educated: Patient Education method: Explanation Education comprehension: verbalized understanding  HOME EXERCISE PROGRAM: Access Code: [email or web address] URL: [email or web address] Date: 03/13/2024 Prepared by: Marci Setter  Exercises - Sit to Stand with Counter Support  - 1 x daily - 7 x weekly - 2 sets - 10 reps - 5 hold - Supine Posterior Pelvic Tilt  - 1 x daily - 7 x weekly - 1 sets - 10 reps - 5 hold - Supine Lower Trunk Rotation  - 1 x daily - 7 x weekly - 2 sets - 10 reps - 10 hold - Hooklying Clamshell with Resistance  - 1 x daily - 7 x weekly - 2 sets - 10 reps - Seated Figure 4 Piriformis Stretch  - 1 x daily - 7 x weekly - 1 sets - 3 reps - 30 hold - Seated Hamstring Stretch  - 1 x daily - 7 x weekly - 1 sets - 3 reps - 30 hold -bridge x 10   ASSESSMENT:  CLINICAL IMPRESSION:   Patient is a 73 y.o. female who was seen today for physical therapy evaluation and treatment for R low back pain and resultant sciatica.  Patient requesting HEP only due to financial reasons.  Pt presents with Rt sided LE weakness in hip and knee.  No foot drop.  Patient was given an HEP she can do  in sitting and 1 that she can do in her bed as an alternative.  Encouraged patient to start with performing the home program 3 times a week and building up a habit consistent movement.  OBJECTIVE IMPAIRMENTS: Abnormal gait, cardiopulmonary status limiting activity, decreased activity tolerance, decreased coordination, decreased endurance, decreased  knowledge of condition, decreased mobility, difficulty walking, decreased ROM, decreased strength, increased muscle spasms, postural dysfunction, obesity, and pain.   ACTIVITY LIMITATIONS: carrying, lifting, bending, sitting, standing, squatting, sleeping, stairs, transfers, and bed mobility  PERSONAL FACTORS: Age, Fitness, Past/current experiences, and 1-2 comorbidities: DM and COPD are also affecting patient's functional outcome.   REHAB POTENTIAL: Fair based on chronicity and co-morbidities  CLINICAL DECISION MAKING: Stable/uncomplicated  EVALUATION COMPLEXITY: Moderate   GOALS: Goals reviewed with patient? Yes  SHORT TERM GOALS=LONG TERM GOALS: Target date:03/13/24    Patient to demonstrate independence in HEP  Baseline: Given on evaluation  goal status: INITIAL   PLAN:  PT FREQUENCY: one time visit  PT DURATION: 1 week  PLANNED INTERVENTIONS: Therapeutic exercises, Therapeutic activity, Gait training, Self Care, Dry Needling, Manual therapy, and Re-evaluation.  PLAN FOR NEXT SESSION: NA, DC   Halton Neas, PT 03/13/2024, 5:54 PM   Marci Setter, PT 03/13/24 6:02 PM Phone: [phone] Fax: [phone]

## 2024-03-13 ENCOUNTER — Ambulatory Visit: Attending: Family Medicine | Admitting: Physical Therapy

## 2024-03-13 DIAGNOSIS — M545 Low back pain, unspecified: Secondary | ICD-10-CM | POA: Diagnosis not present

## 2024-03-13 DIAGNOSIS — M5459 Other low back pain: Secondary | ICD-10-CM | POA: Insufficient documentation

## 2024-03-13 DIAGNOSIS — M6281 Muscle weakness (generalized): Secondary | ICD-10-CM | POA: Insufficient documentation

## 2024-03-13 DIAGNOSIS — G8929 Other chronic pain: Secondary | ICD-10-CM | POA: Insufficient documentation

## 2024-03-17 ENCOUNTER — Encounter: Payer: Self-pay | Admitting: Family Medicine

## 2024-03-17 ENCOUNTER — Other Ambulatory Visit: Payer: Self-pay

## 2024-03-17 ENCOUNTER — Other Ambulatory Visit: Payer: Self-pay | Admitting: Family Medicine

## 2024-03-17 DIAGNOSIS — M160 Bilateral primary osteoarthritis of hip: Secondary | ICD-10-CM

## 2024-03-17 DIAGNOSIS — M47896 Other spondylosis, lumbar region: Secondary | ICD-10-CM

## 2024-03-17 DIAGNOSIS — M48061 Spinal stenosis, lumbar region without neurogenic claudication: Secondary | ICD-10-CM

## 2024-03-17 DIAGNOSIS — G2581 Restless legs syndrome: Secondary | ICD-10-CM

## 2024-03-17 DIAGNOSIS — G894 Chronic pain syndrome: Secondary | ICD-10-CM

## 2024-03-17 MED ORDER — MORPHINE SULFATE ER 15 MG PO TBCR: 15.0000 mg | EXTENDED_RELEASE_TABLET | Freq: Two times a day (BID) | ORAL | 0 refills | Status: DC

## 2024-03-17 MED ORDER — HYDROCODONE-ACETAMINOPHEN 10-325 MG PO TABS: 1.0000 | ORAL_TABLET | Freq: Four times a day (QID) | ORAL | 0 refills | Status: DC | PRN

## 2024-03-18 ENCOUNTER — Ambulatory Visit: Admitting: Acute Care

## 2024-03-19 ENCOUNTER — Encounter (HOSPITAL_COMMUNITY): Payer: Self-pay | Admitting: Emergency Medicine

## 2024-03-19 ENCOUNTER — Other Ambulatory Visit: Payer: Self-pay | Admitting: Family Medicine

## 2024-03-19 ENCOUNTER — Other Ambulatory Visit: Payer: Self-pay

## 2024-03-19 DIAGNOSIS — Z1231 Encounter for screening mammogram for malignant neoplasm of breast: Secondary | ICD-10-CM

## 2024-03-19 NOTE — Progress Notes (Signed)
 SDW CALL  Patient was given pre-op instructions over the phone. The opportunity was given for the patient to ask questions. No further questions asked. Patient verbalized understanding of instructions given.   PCP - Ena Harries McDiarmid Cardiologist - Dr. Lauro Portal - last office visit - 10/31/22 - follow up on an as needed basis  PPM/ICD - denies   Chest CT- 02/15/24 EKG - DOS Stress Test -    ECHO - 12/03/22 Cardiac Cath -   Sleep Study - denies   Fasting Blood Sugar - 150's Patient wears a freestyle libre on right arm - patient is diet controlled  Last dose of GLP1 agonist-  n/a GLP1 instructions:  n/a  Blood Thinner Instructions: n/a Aspirin  Instructions: n/a  ERAS Protcol - NPO   COVID TEST- n/a   Anesthesia review: yes - HTN, COPD, DM  Patient denies shortness of breath, fever, cough and chest pain over the phone call   All instructions explained to the patient, with a verbal understanding of the material. Patient agrees to go over the instructions while at home for a better understanding.

## 2024-03-20 NOTE — Progress Notes (Signed)
 Anesthesia Chart Review: SAME DAY WORK-UP  Case: [id number] Date/Time: 03/24/24 0730   Procedure: BRONCHOSCOPY, WITH BIOPSY USING ELECTROMAGNETIC NAVIGATION (Bilateral) - with Fluoro   Anesthesia type: General   Diagnosis: Lung nodule [R91.1]   Pre-op diagnosis: LUNG NODULE BILATERAL   [facility] / [facility]   Surgeons: [name], MD       DISCUSSION: Patient is a 73 year old female scheduled for the above procedure. She had a enlarging LLL lung nodule on recent LCS chest CT. Above procedure planned for tissue diagnosis.   History includes former smoker (quit 04/06/08), COPD, HTN, DM2 (diet controlled), hypercholesterolemia, NAFL/hepatic steatosis, IBS, GERD, hypothyroidism, rosacea, RLS, memory difficulties, hearing impairment, left adrenal adenoma, anemia, Bipolar disorder, osteoarthritis, fibromyalgia, BRIP1 gene mutation (s/p LOA, robotic assisted LSO 05/19/21, benign pathology), spinal surgery (left L4-5 microdiskectomy 06/21/16).    She saw cardiologist Lauro Portal, MD on 10/31/22 for HTN, HLD, and coronary artery calcification on CT scan. She had normal coronaries on LHC in 2011. She was asymptomatic from a CV standpoint, and LAD was at goal for secondary prevention. Coronary calcium  scoring ordered and done on 11/08/22 showing CAC 111 (69th percentile).  11/28/22 echo showed LVEF 55%, no RWMA, grade 1 DD, normal RV systolic function, trivial MR. Continue statin therapy recommended.   A1c 6.1% on 11/29/23. She wears a [employer]estyle Libre CGM.  Anesthesia team to evaluate on the day of surgery.   VS:  Wt Readings from Last 3 Encounters:  03/03/24 112.9 kg  02/28/24 110.3 kg  11/29/23 106.8 kg   BP Readings from Last 3 Encounters:  03/03/24 (!) 147/78  02/28/24 120/65  11/29/23 138/81   Pulse Readings from Last 3 Encounters:  03/03/24 62  02/28/24 (!) 59  11/29/23 (!) 59     PROVIDERS: McDiarmid, Demetra Filter, MD is PCP    LABS: For day of surgery as  indicated. Last results in [facility] include: Lab Results  Component Value Date   WBC 10.0 10/22/2023   HGB 15.0 10/22/2023   HCT 44.0 10/22/2023   PLT 241 10/22/2023   GLUCOSE 123 (H) 10/22/2023   ALT 28 10/22/2023   AST 25 10/22/2023   NA 133 (L) 10/22/2023   K 3.7 10/22/2023   CL 102 10/22/2023   CREATININE 1.00 10/22/2023   BUN 6 (L) 10/22/2023   CO2 24 10/22/2023   TSH 2.020 04/12/2023   HGBA1C 6.1 11/29/2023    IMAGES: PET Scan 02/25/24: IMPRESSION: - No areas of abnormal radiotracer uptake. - The lung nodules are again seen and do not specifically have any abnormal uptake. Again there are 2 left lower lobe nodules which have been increasing in size over time. Options would include continued surveillance with CT in 3 months versus a more aggressive approach with sampling. - Tiny pleural effusions.  Fatty liver infiltration.  CT Chest 01/21/24: IMPRESSION: 1. Enlarging 13.2 mm irregular left lower lobe nodule. Lung-RADS 4B, suspicious. Additional imaging evaluation or consultation with Pulmonology or Thoracic Surgery recommended. These results will be called to the ordering clinician or representative by the Radiologist Assistant, and communication documented in the PACS or [employer]. 2. Hepatic steatosis. 3. Left adrenal adenoma. 4. Aortic atherosclerosis (ICD10-I70.0). Coronary artery calcification. 5.  Emphysema (ICD10-J43.9).    EKG: Last EKG seen is > 65 year old.  EKG 10/31/22: SB at 58 bpm, Non-specific ST and T wave abnormality.   CV: Echo 11/28/22: IMPRESSIONS   1. Left ventricular ejection fraction, by estimation, is  55%. The left  ventricle has normal function. The left ventricle has no regional wall  motion abnormalities. Left ventricular diastolic parameters are consistent  with Grade I diastolic dysfunction  (impaired relaxation).   2. Right ventricular systolic function is normal. The right ventricular  size is normal. Tricuspid regurgitation  signal is inadequate for assessing  PA pressure.   3. The mitral valve is normal in structure. Trivial mitral valve  regurgitation. No evidence of mitral stenosis.   4. The aortic valve is tricuspid. There is mild calcification of the  aortic valve. Aortic valve regurgitation is trivial. No aortic stenosis is  present.   5. The inferior vena cava is normal in size with greater than 50%  respiratory variability, suggesting right atrial pressure of 3 mmHg.    CT Coronary Calcium  Score 11/08/22: IMPRESSION: Coronary calcium  score of 111. This was 69th percentile for age-, race-, and sex-matched controls.   RECOMMENDATIONS:.. If CAC is 1 to 22, it is reasonable to initiate statin therapy for patients >=28 years of age.   If CAC is >=100 or >=75th percentile, it is reasonable to initiate statin therapy at any age....   Cardiac cath 01/27/10: Conclusion: Widely patent coronary arteries. Normal left ventricular function. Possible left ventricular hypertrophy.  Past Medical History:  Diagnosis Date   Adrenal adenoma, left 12/02/2020   CT AP 11/16/20:  left adrenal mass measuring approximately 2.7 cm. This is minimally increased in size from prior study and is consistent with a benign adrenal adenoma.    Adrenal nodule (HCC) 04/08/2011   04/08/11 Abdominal CT showed Left adrenal nodule which is tachnically indeterminate but most likely an adenoma.  It is 1.7 cm and 42 HU on portal venous phase image. 31 HU on delayed images.   01/09/2012 Abdominal CT w/ & w/o CM: Stable benign left adrenal adenoma. No further specific follow-up is needed for this finding.     Allergic rhinitis 01/03/2007   Qualifier: Diagnosis of  By: Kivett, Whitney     Allergy    Anemia    hx of  in 20's   Anxiety    Bipolar disorder (HCC)    Carpal tunnel syndrome 02/14/2007   S/P carpel tunnel release bilaterally.     Cataracts, bilateral    removed both eyes  2017-2018   Chronic pain syndrome 12/24/2008   Chronic  posterior anal fissure s/p partial internal sphincterotomy 03/28/2018 03/28/2018   COPD WITHOUT EXACERBATION 01/03/2007   Qualifier: Diagnosis of  By: Eleni Griffin  emphysema   Degenerative joint disease of both hips 05/23/2007   CT AP 11/27/20: Thre are degenerative changes of both hips     Diabetes mellitus without complication (HCC)    A1C 6.5 as of 05-2019- diet changes, no meds currently    DISC WITH RADICULOPATHY 01/03/2007   Qualifier: Diagnosis of  By: Eleni Griffin     Emphysema of lung (HCC)    Emphysema of Lung on CT 01/10/2024   Esophageal reflux 01/03/2007   Centricity Description: GASTROESOPHAGEAL REFLUX, NO ESOPHAGITIS Qualifier: Diagnosis of  By: Eleni Griffin   Centricity Description: GERD Qualifier: Diagnosis of  By: Marlane Silver MD, Weston     External hemorrhoids s/p hemorrhoidectomy 03/28/2018 03/28/2018   Facial pain 09/20/2022   Family history of breast cancer    Family history of cancer of female genital organ    Family history of colon cancer    Family history of gene mutation    BRIP1 gene mutation (c.2010dup) in daughter  Family history of lung cancer    Fibromyalgia    Fracture of bone spur of inferior portion of calcaneus 10/06/2018   Functional gastrointestinal disorder 06/18/2008   Functional Gastrointestinal Disorder with frequent eructation and bloating sensation with acute flares.  Responsive to Compazine  and Bentyl .  Takes a few days to calm down.     GERD without esophagitis 01/03/2007   Centricity Description: GASTROESOPHAGEAL REFLUX, NO ESOPHAGITIS Qualifier: Diagnosis of  By: Eleni Griffin   Centricity Description: GERD Qualifier: Diagnosis of  By: Marlane Silver MD, Weston     Hearing impairment 04/28/2021   Heel spur, fracture, left 04/17/2018   HEMORRHOIDS, NOS 01/03/2007   Qualifier: Diagnosis of  By: Eleni Griffin     Hepatic steatosis 04/12/2011   Herpes simplex labialis 05/11/2011   Hip osteoarthritis 05/23/2007   MRI Pelvis (03/13/07)  Bilateral osteoarthritis of the hips, left greater than right.  The  patient has a slightly more prominent hip effusion on the left than  the right.  No other significant abnormality.     History of MRSA infection 04/28/2011   HYPERCHOLESTEROLEMIA 12/24/2008   Hypertension    HYPERTENSION, BENIGN SYSTEMIC 01/03/2007   Qualifier: Diagnosis of  By: Eleni Griffin     HYPERTRIGLYCERIDEMIA 11/11/2007   Qualifier: Diagnosis of  By: McDiarmid MD, Todd     Hypokalemia 12/12/2017   Hypothyroidism 02/15/2007   Qualifier: Diagnosis of  By: McDiarmid MD, Gaylin Ke, BORDERLINE 02/15/2007   Insomnia 03/03/2021   Intra-abdominal adhesions    Irritable bowel syndrome 01/03/2007   Qualifier: Diagnosis of  By: Eleni Griffin     Knee pain, acute 12/01/2022   LACTOSE INTOLERANCE 03/10/2008   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   Leukoplakia of oral cavity 09/20/2022   Major depressive disorder, recurrent episode (HCC) 01/03/2007   Qualifier: Diagnosis of  By: Kivett, Whitney     Medication management 12/14/2016   Managing topiramate  for weight loss starting 12/14/16   Memory difficulties 03/04/2021   Metabolic syndrome 02/29/2012   Monoallelic mutation of BRIP1 gene 01/17/2021   c.2010dup (p.[id number]*)   NAFL (nonalcoholic fatty liver) 04/12/2011   Mild hepatic Steatosis on CT 04/08/11 for abdominal pain.      No impairment of memory 04/29/2021   Obesity (BMI 30.0-34.9) 09/30/2007   Has lost 55 lbs since easter. Goal of <190 by 08/05/12.      Obesity, Class III, BMI 40-49.9 (morbid obesity) 09/30/2007        Osteoarthritis of spine 01/03/2007   MR I Lumbar (08/03): GeneralDDD; L2-3 Large  HNP.  Mod pos  hnp - 12/14/2005, smhnpw/irritL4root - 12/14/2005     OSTEOARTHRITIS, HANDS, BILATERAL 06/18/2008   Qualifier: Diagnosis of  By: McDiarmid MD, Alfornia Imam FEMORAL STRESS SYNDROME 01/03/2007   Qualifier:  Diagnosis of  By: Eleni Griffin     Periodic limb movements of sleep 12/05/2010   Diagnosed on Polysomnography testing Fall 2011.      Pneumonia    walking pneumonia in the 90's   Pre-diabetes 12/25/2008   Qualifier: Diagnosis of  By: McDiarmid MD, Todd     Prolapsed internal hemorrhoids, grade 3, s/p ligation/pexy/hemorrhoidectomy 03/28/2018 03/28/2018   Pulmonary nodules/lesions, multiple 03/02/2023   Pyriformis syndrome, right 03/02/2023   RESTLESS LEGS SYNDROME 09/11/2007   Polysomnography (10/2010, Dr Dianne Fortune, interpreter. ) showed periodic limb movements that frequently awoke patient from sleep with arousal index of 4 per  hour. Dr Roma Close (Sleep Specialist) thought her RLS was the major contributor to patient poor sleep condition, more than any sleep apnea.  He recommended considering opamine agonist (either requip or mirapex) to see if this will help.      Rosacea, acne 10/24/2011   SIGMOID POLYP 01/23/2008   Solitary lung nodule 04/08/2011   Stage 1 mild COPD by GOLD classification (HCC) 01/03/2007   11/28/2017 Chest CT: Mild centrilobular and paraseptal emphysema with mild diffuse bronchial wall thickening     Stress incontinence    Synovial cyst of lumbar facet joint 02/17/2016   Trigger thumb of left hand 08/16/2018   Urticaria, chronic 05/22/2011   VITAMIN D  DEFICIENCY 03/03/2010   Work-related condition 05/17/2018    Past Surgical History:  Procedure Laterality Date   APPENDECTOMY     bladder tack  1998   Dr Inga Manges (urology)   BUNIONECTOMY  1992   bilateral feet with pins in great toes   CARDIAC CATHETERIZATION     CARPAL TUNNEL RELEASE  2010   Bilateral, Dr Porfirio Bristol Sypher   COLONOSCOPY  2004   Dr Helen Loa (GI)   CYSTOCELE REPAIR  1998    Dr Alix Aquas (GYN)-   ESOPHAGOGASTRODUODENOSCOPY     EVALUATION UNDER ANESTHESIA WITH HEMORRHOIDECTOMY N/A 03/28/2018   Procedure: ANORECTAL EXAM UNDER ANESTHESIA lateral internal partial sphincterotomy, hemorrhoidal ligation  pixie, HEMORRHOIDECTOMY;  Surgeon: Candyce Champagne, MD;  Location: [facility];  Service: General;  Laterality: N/A;   EXAM ANORECTAL W/ ULTRASOUND     Dr. Hershell Lose 03-28-18    EXPLORATORY LAPAROTOMY     FACIAL LACERATIONS REPAIR     Facial Laceration repair as adolescent    INJECTION HIP INTRA ARTICULAR  2008   Left Hip fluoroscopically-guided corticosteorid injection for painful osteoarthritis with good effect (06/2007)   LAPAROSCOPIC LYSIS OF ADHESIONS  05/19/2021   Procedure: LAPAROSCOPIC LYSIS OF ADHESIONS; BIOPSY ABDOMINAL SKIN LESION;  Surgeon: Suzi Essex, MD;  Location: [facility];  Service: Gynecology;;   LAPAROSCOPY FOR ECTOPIC PREGNANCY     LUMBAR LAMINECTOMY/DECOMPRESSION MICRODISCECTOMY N/A 06/21/2016   Procedure: MICRO LUMBAR DECOMPRESSION L4-L5 AND REMOVAL OF SYNOVIAL CYST    1 LEVEL;  Surgeon: Orvan Blanch, MD;  Location: [facility];  Service: Orthopedics;  Laterality: N/A;   MRSA     NM MYOVIEW  LTD  2011   Dr Kay Parson, III (Card)   RECTOCELE REPAIR  1998   Dr Alix Aquas (GYN)   ROBOTIC ASSISTED SALPINGO OOPHERECTOMY N/A 05/19/2021   Procedure: XI ROBOTIC ASSISTED LEFT SALPINGO- OOPHORECTOMY WITH PELVIC WASHINGS;  Surgeon: Suzi Essex, MD;  Location: [facility];  Service: Gynecology;  Laterality: N/A;   SPHINCTEROTOMY N/A 03/28/2018   Procedure: ANAL SPHINCTEROTOMY;  Surgeon: Candyce Champagne, MD;  Location: [facility];  Service: General;  Laterality: N/A;   UPPER GASTROINTESTINAL ENDOSCOPY      MEDICATIONS: No current facility-administered medications for this encounter.    atorvastatin  (LIPITOR) 20 MG tablet   buPROPion ER (WELLBUTRIN SR) 100 MG 12 hr tablet   Calcium  Carb-Cholecalciferol (CALCIUM -VITAMIN D ) 600-400 MG-UNIT TABS   citalopram  (CELEXA ) 20 MG tablet   Continuous Glucose Sensor (FREESTYLE LIBRE 14 DAY SENSOR) MISC   dicyclomine  (BENTYL ) 20 MG tablet   fexofenadine  (ALLEGRA ) 180 MG tablet   fluticasone (FLONASE) 50 MCG/ACT nasal spray   hydrochlorothiazide   (HYDRODIURIL ) 25 MG tablet   HYDROcodone -acetaminophen  (NORCO) 10-325 MG tablet   KLOR-CON  M10 10 MEQ tablet   levothyroxine  (SYNTHROID ) 88 MCG tablet   LORazepam  (ATIVAN ) 0.5 MG  tablet   metoprolol  tartrate (LOPRESSOR ) 50 MG tablet   morphine  (MS CONTIN ) 15 MG 12 hr tablet   Multiple Vitamins-Minerals (MULTIVITAMIN WITH MINERALS) tablet   omeprazole  (PRILOSEC) 40 MG capsule   polyethylene glycol powder (GLYCOLAX /MIRALAX ) 17 GM/SCOOP powder   prazosin  (MINIPRESS ) 2 MG capsule   Probiotic Product (PROBIOTIC DAILY PO)   promethazine  (PHENERGAN ) 25 MG tablet   ramipril  (ALTACE ) 10 MG capsule   senna-docusate (SENOKOT-S) 8.6-50 MG tablet   solifenacin  (VESICARE ) 5 MG tablet   traZODone  (DESYREL ) 50 MG tablet   vitamin B-12 (CYANOCOBALAMIN ) 1000 MCG tablet    Ella Gun, PA-C Surgical Short Stay/Anesthesiology [facility] Phone [phone] [facility] Phone [phone] 03/20/2024 9:43 AM

## 2024-03-20 NOTE — Anesthesia Preprocedure Evaluation (Addendum)
 Anesthesia Evaluation  Patient identified by MRN, date of birth, ID band Patient awake    Reviewed: Allergy & Precautions, NPO status , Patient's Chart, lab work & pertinent test results, reviewed documented beta blocker date and time   History of Anesthesia Complications Negative for: history of anesthetic complications  Airway Mallampati: I  TM Distance: >3 FB Neck ROM: Full    Dental  (+) Poor Dentition, Missing, Chipped, Dental Advisory Given   Pulmonary COPD, former smoker   breath sounds clear to auscultation       Cardiovascular hypertension, Pt. on medications and Pt. on home beta blockers (-) angina + CAD   Rhythm:Regular Rate:Normal  '24 ECHO :EF 55-60%, normal LVF, normal RVF, no significant valvular abnormalities   Neuro/Psych  Headaches  Anxiety Depression Bipolar Disorder      GI/Hepatic ,GERD  Medicated and Controlled,,(+) Hepatitis -  Endo/Other  diabetes (glu 143)Hypothyroidism  Class 4 obesity  Renal/GU      Musculoskeletal  (+) Arthritis ,  Fibromyalgia -  Abdominal   Peds  Hematology Hb 12.6, plt 220k   Anesthesia Other Findings   Reproductive/Obstetrics                             Anesthesia Physical Anesthesia Plan  ASA: 3  Anesthesia Plan: General   Post-op Pain Management: Tylenol  PO (pre-op)*   Induction: Intravenous  PONV Risk Score and Plan: 3 and Ondansetron , Dexamethasone  and Treatment may vary due to age or medical condition  Airway Management Planned: Oral ETT  Additional Equipment: None  Intra-op Plan:   Post-operative Plan: Extubation in OR  Informed Consent: I have reviewed the patients History and Physical, chart, labs and discussed the procedure including the risks, benefits and alternatives for the proposed anesthesia with the patient or authorized representative who has indicated his/her understanding and acceptance.     Dental advisory  given  Plan Discussed with: CRNA and Surgeon  Anesthesia Plan Comments: (PAT note written 03/20/2024 by Allison Zelenak, PA-C.  )       Anesthesia Quick Evaluation

## 2024-03-24 ENCOUNTER — Ambulatory Visit (HOSPITAL_COMMUNITY)
Admission: RE | Admit: 2024-03-24 | Discharge: 2024-03-24 | Disposition: A | Discharge status: Home / Self Care | Attending: Emergency Medicine | Admitting: Emergency Medicine

## 2024-03-24 ENCOUNTER — Ambulatory Visit (HOSPITAL_COMMUNITY)

## 2024-03-24 ENCOUNTER — Ambulatory Visit (HOSPITAL_BASED_OUTPATIENT_CLINIC_OR_DEPARTMENT_OTHER): Payer: Self-pay | Admitting: Vascular Surgery

## 2024-03-24 ENCOUNTER — Encounter (HOSPITAL_COMMUNITY)
Admission: RE | Disposition: A | Discharge status: Home / Self Care | Payer: Self-pay | Source: Home / Self Care | Attending: Emergency Medicine

## 2024-03-24 ENCOUNTER — Other Ambulatory Visit: Payer: Self-pay

## 2024-03-24 ENCOUNTER — Encounter (HOSPITAL_COMMUNITY): Payer: Self-pay | Admitting: Emergency Medicine

## 2024-03-24 ENCOUNTER — Ambulatory Visit (HOSPITAL_COMMUNITY): Payer: Self-pay | Admitting: Vascular Surgery

## 2024-03-24 DIAGNOSIS — F419 Anxiety disorder, unspecified: Secondary | ICD-10-CM | POA: Diagnosis not present

## 2024-03-24 DIAGNOSIS — I2510 Atherosclerotic heart disease of native coronary artery without angina pectoris: Secondary | ICD-10-CM | POA: Insufficient documentation

## 2024-03-24 DIAGNOSIS — C3432 Malignant neoplasm of lower lobe, left bronchus or lung: Secondary | ICD-10-CM | POA: Diagnosis not present

## 2024-03-24 DIAGNOSIS — E119 Type 2 diabetes mellitus without complications: Secondary | ICD-10-CM | POA: Diagnosis not present

## 2024-03-24 DIAGNOSIS — R911 Solitary pulmonary nodule: Secondary | ICD-10-CM | POA: Diagnosis present

## 2024-03-24 DIAGNOSIS — Z87891 Personal history of nicotine dependence: Secondary | ICD-10-CM

## 2024-03-24 DIAGNOSIS — K219 Gastro-esophageal reflux disease without esophagitis: Secondary | ICD-10-CM | POA: Insufficient documentation

## 2024-03-24 DIAGNOSIS — M797 Fibromyalgia: Secondary | ICD-10-CM | POA: Diagnosis not present

## 2024-03-24 DIAGNOSIS — J432 Centrilobular emphysema: Secondary | ICD-10-CM | POA: Diagnosis not present

## 2024-03-24 DIAGNOSIS — F319 Bipolar disorder, unspecified: Secondary | ICD-10-CM | POA: Insufficient documentation

## 2024-03-24 DIAGNOSIS — Z48813 Encounter for surgical aftercare following surgery on the respiratory system: Secondary | ICD-10-CM | POA: Diagnosis not present

## 2024-03-24 DIAGNOSIS — E6689 Other obesity not elsewhere classified: Secondary | ICD-10-CM | POA: Insufficient documentation

## 2024-03-24 DIAGNOSIS — I10 Essential (primary) hypertension: Secondary | ICD-10-CM | POA: Insufficient documentation

## 2024-03-24 DIAGNOSIS — Z6841 Body Mass Index (BMI) 40.0 and over, adult: Secondary | ICD-10-CM | POA: Diagnosis not present

## 2024-03-24 DIAGNOSIS — Z79899 Other long term (current) drug therapy: Secondary | ICD-10-CM | POA: Diagnosis not present

## 2024-03-24 DIAGNOSIS — E039 Hypothyroidism, unspecified: Secondary | ICD-10-CM | POA: Diagnosis not present

## 2024-03-24 DIAGNOSIS — M1990 Unspecified osteoarthritis, unspecified site: Secondary | ICD-10-CM | POA: Diagnosis not present

## 2024-03-24 DIAGNOSIS — Z8619 Personal history of other infectious and parasitic diseases: Secondary | ICD-10-CM | POA: Insufficient documentation

## 2024-03-24 DIAGNOSIS — R918 Other nonspecific abnormal finding of lung field: Secondary | ICD-10-CM | POA: Diagnosis not present

## 2024-03-24 HISTORY — PX: BRONCHOSCOPY, WITH BIOPSY USING ELECTROMAGNETIC NAVIGATION: SHX7536

## 2024-03-24 HISTORY — PX: BRONCHIAL NEEDLE ASPIRATION BIOPSY: SHX5106

## 2024-03-24 HISTORY — PX: BRONCHIAL BRUSHINGS: SHX5108

## 2024-03-24 HISTORY — PX: FUDUCIAL PLACEMENT: SHX5083

## 2024-03-24 HISTORY — PX: BRONCHIAL BIOPSY: SHX5109

## 2024-03-24 LAB — BASIC METABOLIC PANEL WITH GFR
Anion gap: 10 (ref 5–15)
BUN: 12 mg/dL (ref 8–23)
CO2: 27 mmol/L (ref 22–32)
Calcium: 9.3 mg/dL (ref 8.9–10.3)
Chloride: 97 mmol/L — ABNORMAL LOW (ref 98–111)
Creatinine, Ser: 0.85 mg/dL (ref 0.44–1.00)
GFR, Estimated: >60 mL/min (ref >60)
Glucose, Bld: 130 mg/dL — ABNORMAL HIGH (ref 70–99)
Potassium: 4.8 mmol/L (ref 3.5–5.1)
Sodium: 134 mmol/L — ABNORMAL LOW (ref 135–145)

## 2024-03-24 LAB — CBC
HCT: 38.2 % (ref 36.0–46.0)
Hemoglobin: 12.6 g/dL (ref 12.0–15.0)
MCH: 30.6 pg (ref 26.0–34.0)
MCHC: 33 g/dL (ref 30.0–36.0)
MCV: 92.7 fL (ref 80.0–100.0)
Platelets: 220 10*3/uL (ref 150–400)
RBC: 4.12 MIL/uL (ref 3.87–5.11)
RDW: 13.2 % (ref 11.5–15.5)
WBC: 6.5 10*3/uL (ref 4.0–10.5)
nRBC: 0 % (ref 0.0–0.2)

## 2024-03-24 LAB — GLUCOSE, CAPILLARY
Glucose-Capillary: 143 mg/dL — ABNORMAL HIGH (ref 70–99)
Glucose-Capillary: 127 mg/dL — ABNORMAL HIGH (ref 70–99)

## 2024-03-24 SURGERY — BRONCHOSCOPY, WITH BIOPSY USING ELECTROMAGNETIC NAVIGATION
Anesthesia: General | Laterality: Bilateral

## 2024-03-24 MED ORDER — FENTANYL CITRATE (PF) 250 MCG/5ML IJ SOLN
INTRAMUSCULAR | Status: DC | PRN
  Administered 2024-03-24: 100 ug via INTRAVENOUS

## 2024-03-24 MED ORDER — ROCURONIUM BROMIDE 10 MG/ML (PF) SYRINGE
PREFILLED_SYRINGE | INTRAVENOUS | Status: DC | PRN
  Administered 2024-03-24: 20 mg via INTRAVENOUS
  Administered 2024-03-24: 60 mg via INTRAVENOUS

## 2024-03-24 MED ORDER — LACTATED RINGERS IV SOLN: INTRAVENOUS | Status: DC | PRN

## 2024-03-24 MED ORDER — ACETAMINOPHEN 500 MG PO TABS
1000.0000 mg | ORAL_TABLET | Freq: Once | ORAL | Status: AC
  Administered 2024-03-24: 1000 mg via ORAL
  Filled 2024-03-24: qty 2

## 2024-03-24 MED ORDER — PROPOFOL 500 MG/50ML IV EMUL
INTRAVENOUS | Status: DC | PRN
  Administered 2024-03-24: 100 ug/kg/min via INTRAVENOUS

## 2024-03-24 MED ORDER — OXYCODONE HCL 5 MG/5ML PO SOLN: 5.0000 mg | Freq: Once | ORAL | Status: AC | PRN

## 2024-03-24 MED ORDER — LIDOCAINE 2% (20 MG/ML) 5 ML SYRINGE
INTRAMUSCULAR | Status: DC | PRN
  Administered 2024-03-24: 40 mg via INTRAVENOUS

## 2024-03-24 MED ORDER — MIDAZOLAM HCL 2 MG/2ML IJ SOLN: 0.5000 mg | Freq: Once | INTRAMUSCULAR | Status: DC | PRN

## 2024-03-24 MED ORDER — PROPOFOL 10 MG/ML IV BOLUS
INTRAVENOUS | Status: DC | PRN
  Administered 2024-03-24: 50 mg via INTRAVENOUS
  Administered 2024-03-24: 150 mg via INTRAVENOUS

## 2024-03-24 MED ORDER — FENTANYL CITRATE (PF) 100 MCG/2ML IJ SOLN: 25.0000 ug | INTRAMUSCULAR | Status: DC | PRN

## 2024-03-24 MED ORDER — SUGAMMADEX SODIUM 200 MG/2ML IV SOLN
INTRAVENOUS | Status: DC | PRN
  Administered 2024-03-24: 400 mg via INTRAVENOUS

## 2024-03-24 MED ORDER — ONDANSETRON HCL 4 MG/2ML IJ SOLN
INTRAMUSCULAR | Status: DC | PRN
  Administered 2024-03-24: 4 mg via INTRAVENOUS

## 2024-03-24 MED ORDER — MEPERIDINE HCL 25 MG/ML IJ SOLN: 6.2500 mg | INTRAMUSCULAR | Status: DC | PRN

## 2024-03-24 MED ORDER — CHLORHEXIDINE GLUCONATE 0.12 % MT SOLN
15.0000 mL | Freq: Once | OROMUCOSAL | Status: AC
  Administered 2024-03-24: 15 mL via OROMUCOSAL
  Filled 2024-03-24: qty 15

## 2024-03-24 MED ORDER — LACTATED RINGERS IV SOLN: INTRAVENOUS | Status: DC

## 2024-03-24 MED ORDER — FENTANYL CITRATE (PF) 100 MCG/2ML IJ SOLN
INTRAMUSCULAR | Status: AC
  Filled 2024-03-24: qty 2

## 2024-03-24 MED ORDER — OXYCODONE HCL 5 MG PO TABS
5.0000 mg | ORAL_TABLET | Freq: Once | ORAL | Status: AC | PRN
  Administered 2024-03-24: 5 mg via ORAL

## 2024-03-24 MED ORDER — OXYCODONE HCL 5 MG PO TABS
ORAL_TABLET | ORAL | Status: AC
  Filled 2024-03-24: qty 1

## 2024-03-24 MED ORDER — INSULIN ASPART 100 UNIT/ML IJ SOLN: 0.0000 [IU] | INTRAMUSCULAR | Status: DC | PRN

## 2024-03-24 MED ORDER — PHENYLEPHRINE 80 MCG/ML (10ML) SYRINGE FOR IV PUSH (FOR BLOOD PRESSURE SUPPORT)
PREFILLED_SYRINGE | INTRAVENOUS | Status: DC | PRN
  Administered 2024-03-24: 160 ug via INTRAVENOUS

## 2024-03-24 SURGICAL SUPPLY — 1 items: Super Lock Fiducial Marker IMPLANT

## 2024-03-24 NOTE — Anesthesia Procedure Notes (Signed)
 Procedure Name: Intubation Date/Time: 03/24/2024 7:52 AM  Performed by: Hebert Littler, CRNAPre-anesthesia Checklist: Patient identified, Emergency Drugs available, Suction available, Patient being monitored and Timeout performed Patient Re-evaluated:Patient Re-evaluated prior to induction Oxygen Delivery Method: Circle system utilized Preoxygenation: Pre-oxygenation with 100% oxygen Induction Type: IV induction Ventilation: Two handed mask ventilation required and Oral airway inserted - appropriate to patient size Laryngoscope Size: Mac and 3 Grade View: Grade I Tube type: Oral Tube size: 8.5 mm Number of attempts: 1 Airway Equipment and Method: Patient positioned with wedge pillow and Stylet Placement Confirmation: ETT inserted through vocal cords under direct vision, positive ETCO2, CO2 detector and breath sounds checked- equal and bilateral Secured at: 23 cm Tube secured with: Tape

## 2024-03-24 NOTE — Interval H&P Note (Signed)
 History and Physical Interval Note:  03/24/2024 7:24 AM  [name]  has presented today for surgery, with the diagnosis of LUNG NODULE BILATERAL.  The various methods of treatment have been discussed with the patient and family. After consideration of risks, benefits and other options for treatment, the patient has consented to  Procedure(s) with comments: BRONCHOSCOPY, WITH BIOPSY USING ELECTROMAGNETIC NAVIGATION (Bilateral) - with Fluoro as a surgical intervention.  The patient's history has been reviewed, patient examined, no change in status, stable for surgery.  I have reviewed the patient's chart and labs.  Questions were answered to the patient's satisfaction.     [name]

## 2024-03-24 NOTE — Discharge Instructions (Addendum)

## 2024-03-24 NOTE — Anesthesia Postprocedure Evaluation (Signed)
 Anesthesia Post Note  Patient: Evadene Wardrip  Procedure(s) Performed: BRONCHOSCOPY, WITH BIOPSY USING ELECTROMAGNETIC NAVIGATION (Bilateral) BRONCHOSCOPY, WITH BIOPSY BRONCHOSCOPY, WITH NEEDLE ASPIRATION BIOPSY INSERTION, FIDUCIAL MARKER, GOLD BRONCHOSCOPY, WITH BRUSH BIOPSY     Patient location during evaluation: PACU Anesthesia Type: General Level of consciousness: awake and alert, patient cooperative and oriented Pain management: pain level controlled Vital Signs Assessment: post-procedure vital signs reviewed and stable Respiratory status: spontaneous breathing, nonlabored ventilation and respiratory function stable Cardiovascular status: blood pressure returned to baseline and stable Postop Assessment: no apparent nausea or vomiting Anesthetic complications: no   No notable events documented.  Last Vitals:  Vitals:   03/24/24 0930 03/24/24 0945  BP: 113/86 108/82  Pulse: 72 68  Resp: 16 15  Temp: 36.5 C   SpO2: 95% 95%    Last Pain:  Vitals:   03/24/24 0928  TempSrc:   PainSc: 4                  Jacy Howat,E. Jonmichael Beadnell

## 2024-03-24 NOTE — Transfer of Care (Signed)
 Immediate Anesthesia Transfer of Care Note  Patient: Rickita Forstner  Procedure(s) Performed: BRONCHOSCOPY, WITH BIOPSY USING ELECTROMAGNETIC NAVIGATION (Bilateral) BRONCHOSCOPY, WITH BIOPSY BRONCHOSCOPY, WITH NEEDLE ASPIRATION BIOPSY INSERTION, FIDUCIAL MARKER, GOLD  Patient Location: PACU  Anesthesia Type:General  Level of Consciousness: awake, alert , oriented, and patient cooperative  Airway & Oxygen Therapy: Patient Spontanous Breathing and Patient connected to face mask oxygen  Post-op Assessment: Report given to RN, Post -op Vital signs reviewed and stable, Patient moving all extremities, Patient moving all extremities X 4, and Patient able to stick tongue midline  Post vital signs: Reviewed and stable  Last Vitals:  Vitals Value Taken Time  BP 151/82 03/24/24 0903  Temp    Pulse 73 03/24/24 0904  Resp 18 03/24/24 0904  SpO2 99 % 03/24/24 0904  Vitals shown include unfiled device data.  Last Pain:  Vitals:   03/24/24 0622  TempSrc:   PainSc: 0-No pain         Complications: No notable events documented.

## 2024-03-24 NOTE — Op Note (Signed)
 Video Bronchoscopy with Robotic Assisted Bronchoscopic Navigation   Date of Operation: 03/24/2024   Pre-op Diagnosis: Left lower lobe pulmonary nodules  Post-op Diagnosis: Left lower lobe pulmonary nodules  Surgeon: Racheal Buddle  Assistants: None  Anesthesia: General endotracheal anesthesia  Operation: Flexible video fiberoptic bronchoscopy with robotic assistance and biopsies.  Estimated Blood Loss: Minimal  Complications: None  Indications and History: [name] is a 73 y.o. female with history of tobacco use.  She was found to have evolving left lower lobe pulmonary nodules on CT scan of the chest.  Recommendation made to achieve a tissue diagnosis via robotic assisted navigational bronchoscopy.  The risks, benefits, complications, treatment options and expected outcomes were discussed with the patient.  The possibilities of pneumothorax, pneumonia, reaction to medication, pulmonary aspiration, perforation of a viscus, bleeding, failure to diagnose a condition and creating a complication requiring transfusion or operation were discussed with the patient who freely signed the consent.    Description of Procedure: The patient was seen in the Preoperative Area, was examined and was deemed appropriate to proceed.  The patient was taken to [facility] room 3, identified as [name] and the procedure verified as Flexible Video Fiberoptic Bronchoscopy.  A Time Out was held and the above information confirmed.   Prior to the date of the procedure a high-resolution CT scan of the chest was performed. Utilizing ION software program a virtual tracheobronchial tree was generated to allow the creation of distinct navigation pathways to the patient's parenchymal abnormalities. After being taken to the operating room general anesthesia was initiated and the patient  was orally intubated. The video fiberoptic bronchoscope was introduced via the endotracheal tube and a general inspection  was performed which showed normal right and left lung anatomy. Aspiration of the bilateral mainstems was completed to remove any remaining secretions. Robotic catheter inserted into patient's endotracheal tube.   Target #1 left lower lobe pulmonary nodule: The distinct navigation pathways prepared prior to this procedure were then utilized to navigate to patient's most discrete left lower lobe nodule identified on CT scan. The robotic catheter was secured into place and the vision probe was withdrawn.  Lesion location was approximated using fluoroscopy.  There was significant atelectasis and inability to see the nodule in the supine position and therefore the patient was repositioned into the right decubitus position and recruitment breaths were performed.  Local registration and targeting was performed using [employer] Cios mobile C-arm three-dimensional imaging with much better visualization after the recruitment and repositioning. Under fluoroscopic guidance transbronchial needle brushings, transbronchial needle biopsies, and transbronchial forceps biopsies were performed to be sent for cytology and pathology.  Needle-in-lesion was confirmed using Cios mobile C-arm.  Under fluoroscopic guidance a single fiducial marker was placed adjacent to the nodule.   At the end of the procedure a general airway inspection was performed and there was no evidence of active bleeding. The bronchoscope was removed.  The patient tolerated the procedure well. There was no significant blood loss and there were no obvious complications. A post-procedural chest x-ray is pending.  Samples Target #1: 1. Transbronchial needle brushings from left lower lobe pulmonary nodule 2. Transbronchial Wang needle biopsies from left lower lobe pulmonary nodule 3. Transbronchial forceps biopsies from the lower lobe pulmonary nodule   Plans:  The patient will be discharged from the PACU to home when recovered from anesthesia  and after chest x-ray is reviewed. We will review the cytology, pathology and microbiology results with the patient  when they become available. Outpatient followup will be with Braden Caddy, NP, Dr. Baldwin Levee.    Racheal Buddle, MD, PhD 03/24/2024, 8:57 AM [facility] [phone] or if no answer before 7:00PM call [phone] For any issues after 7:00PM please call eLink [phone]

## 2024-03-25 LAB — CYTOLOGY - NON PAP

## 2024-03-26 ENCOUNTER — Other Ambulatory Visit: Payer: Self-pay | Admitting: Family Medicine

## 2024-03-26 DIAGNOSIS — E039 Hypothyroidism, unspecified: Secondary | ICD-10-CM

## 2024-03-27 ENCOUNTER — Ambulatory Visit: Payer: Self-pay

## 2024-03-27 NOTE — Telephone Encounter (Signed)
 Copied from CRM [id number]. Topic: Clinical - Red Word Triage >> Mar 27, 2024 10:09 AM Margarette Shawl wrote: Red Word that prompted transfer to Nurse Triage:   Had a bronchoscopy on Monday Was advised blood in mucus would be normal Tues, had very little mucus present Wed and Thurs, has blood in mucus when coughing..more blood than mucus present Not vomiting blood   Chief Complaint: Coughing up blood  Symptoms: Blood present in sputum  Frequency: Intermittent  Pertinent Negatives: Patient denies shortness of breath or fever Disposition: [] ED /[] Urgent Care (no appt availability in office) / [] Appointment(In office/virtual)/ []  [facility]/ [x] Home Care/ [] Refused Recommended Disposition /[] [facility]/ []  Follow-up with PCP Additional Notes: Patient states that she had a bronchoscopy on Monday and was told she may cough up some blood during the healing process. Patient calling stating that she is coughing up blood with her sputum intermittently, stating that the amount of blood is around a small fingernails worth. She denies any other symptoms with the blood. I reinforced that a small amount of blood after that procedure is to be expected. Patient instructed to monitor her symptoms and to call back for new or worsening symptoms. Patient verbalized understanding and agreement with this plan.      Reason for Disposition  Few streaks of blood mixed in with yellow or green sputum (all other triage questions negative)  Answer Assessment - Initial Assessment Questions 1. ONSET: "When did the cough begin?"      3-4 days ago  2. SEVERITY: "How bad is the cough today?" "Did the blood appear after a coughing spell?"      N/A recent bronchoscopy  3. SPUTUM: "Describe the color of your sputum" (none, dry cough; clear, white, yellow, green)     Clear/Yellow 4. HEMOPTYSIS: "How much blood?" (flecks, streaks, tablespoons, etc.)     Small fingernail  5. DIFFICULTY BREATHING: "Are you  having difficulty breathing?" If Yes, ask: "How bad is it?" (e.g., mild, moderate, severe)    - MILD: No SOB at rest, mild SOB with walking, speaks normally in sentences, can lie down, no retractions, pulse < 100.    - MODERATE: SOB at rest, SOB with minimal exertion and prefers to sit, cannot lie down flat, speaks in phrases, mild retractions, audible wheezing, pulse 100-120.    - SEVERE: Very SOB at rest, speaks in single words, struggling to breathe, sitting hunched forward, retractions, pulse > 120      None 6. FEVER: "Do you have a fever?" If Yes, ask: "What is your temperature, how was it measured, and when did it start?"     No 7. CARDIAC HISTORY: "Do you have any history of heart disease?" (e.g., heart attack, congestive heart failure)      Yes 8. LUNG HISTORY: "Do you have any history of lung disease?"  (e.g., pulmonary embolus, asthma, emphysema)     Yes 9. PE RISK FACTORS: "Do you have a history of blood clots?" (or: recent major surgery, recent prolonged travel, bedridden)     No 10. OTHER SYMPTOMS: "Do you have any other symptoms?" (e.g., runny nose, wheezing, chest pain)       No  Protocols used: Coughing Up Blood-A-AH

## 2024-03-28 NOTE — Telephone Encounter (Signed)
 FYI to [name], NP. Pt has an upcoming appointment with Isa Manuel on 04-01-24.

## 2024-04-01 ENCOUNTER — Encounter: Payer: Self-pay | Admitting: Acute Care

## 2024-04-01 ENCOUNTER — Ambulatory Visit: Admitting: Acute Care

## 2024-04-01 VITALS — BP 156/90 | HR 58 | Ht 64.0 in | Wt 253.0 lb

## 2024-04-01 DIAGNOSIS — Z9889 Other specified postprocedural states: Secondary | ICD-10-CM | POA: Diagnosis not present

## 2024-04-01 DIAGNOSIS — Z87891 Personal history of nicotine dependence: Secondary | ICD-10-CM | POA: Diagnosis not present

## 2024-04-01 DIAGNOSIS — R9389 Abnormal findings on diagnostic imaging of other specified body structures: Secondary | ICD-10-CM | POA: Diagnosis not present

## 2024-04-01 DIAGNOSIS — C3490 Malignant neoplasm of unspecified part of unspecified bronchus or lung: Secondary | ICD-10-CM | POA: Diagnosis not present

## 2024-04-01 DIAGNOSIS — C3432 Malignant neoplasm of lower lobe, left bronchus or lung: Secondary | ICD-10-CM | POA: Diagnosis not present

## 2024-04-01 DIAGNOSIS — J069 Acute upper respiratory infection, unspecified: Secondary | ICD-10-CM

## 2024-04-01 MED ORDER — AZITHROMYCIN 250 MG PO TABS: ORAL_TABLET | ORAL | 0 refills | Status: DC

## 2024-04-01 NOTE — Patient Instructions (Addendum)
 It is good to see you today. I am glad you have done well since the procedure. Your biopsy was positive for non small cell lung cancer, adenocarcinoma. I have referred you for consult with both radiation oncology and medical oncology to discuss treatment options.  You will get phone calls from both to get these appointments scheduled. I have ordered an MRI Brain to complete staging. We will send in a prescription for a z pack. Take 2 tablets day one, and then one tablet daily x the next 4 days.  Please take probiotic with antibiotics while on antibiotics. Call if you need us  . You will get great care at the [facility].  Please contact office for sooner follow up if symptoms do not improve or worsen or seek emergency care

## 2024-04-01 NOTE — Progress Notes (Signed)
 History of Present Illness [name] is a 73 y.o. female  former smoker , quit 2009 , followed by the lung cancer screening program. Referred for abnormal LDCT 01/2024. She will be followed by Dr. Baldwin Levee.   Synopsis Pt. Had a LR 4 B Lung Cancer Screening Scan 01/2024. She then had a PET scan to further evaluate these findings. 02/2024. PET did not show significant radiotracer uptake, but as the nodules had been increasing in size over time, she wanted to move forward with biopsy vs watchful waiting. She underwent bronchoscopy with biopsies . She is here today to review the results.   04/01/2024 Pt. Presents for  follow up bronchoscopy. She states she has been doing well after the procedure. She did have some blood tinged secretions the second third and fourth days after the procedure. This has cleared. She does have a productive cough, but not much is coming up. It does sound like the beginning of bronchitis.   We have reviewed the biopsy results with the patient and her daughter. We discussed that her biopsy was + for adenocarcinoma of the Left lower lobe . I have placed referral to both radiation oncology as well as medical oncology. I have also ordered an MR Brain to complete staging. Pt. And her daughter are managing the diagnosis well. We did discuss that there was a Right lower lobe nodule that is concerning, that Dr. Baldwin Levee was not able to biopsy with bronchoscopy. They are aware of this also.   Pt. Does have a cough today. Sounds like the beginning of a bronchitis. I will go ahead and treat with antibiotics as I want to make sure she is well to start receiving treatment as soon as possible.   BP was a bit high today in the office. The patient feels this is because she has been anxious about her biopsy results. He also had a low blood sugar of 88 today while in the office. She ate 2 peppermints as the diabetic nurse had instructed her to do, with improvement in her blood sugar.   Test  Results: Cytology FINAL MICROSCOPIC DIAGNOSIS:  A. LUNG, LEFT LOWER LOBE, FINE NEEDLE ASPIRATION:  - Malignant  - Adenocarcinoma   B. LUNG, LEFT LOWER LOBE, BRUSHING:  - Malignant  - Adenocarcinoma   02/28/2024 PET No areas of abnormal radiotracer uptake.   The lung nodules are again seen and do not specifically have any abnormal uptake. Again there are 2 left lower lobe nodules which have been increasing in size over time. Options would include continued surveillance with CT in 3 months versus a more aggressive approach with sampling.   Tiny pleural effusions.  Fatty liver infiltration.   LDCT Chest 02/15/2024 IMPRESSION: 1. Enlarging 13.2 mm irregular left lower lobe nodule. Lung-RADS 4B, suspicious. Additional imaging evaluation or consultation with Pulmonology or Thoracic Surgery recommended. These results will be called to the ordering clinician or representative by the Radiologist Assistant, and communication documented in the PACS or [employer]. 2. Hepatic steatosis. 3. Left adrenal adenoma. 4. Aortic atherosclerosis (ICD10-I70.0). Coronary artery calcification. 5.  Emphysema (ICD10-J43.9).        Electronically signed by Elwyn Hamper, RN at 02/18/2024 11:08 AM    Latest Ref Rng & Units 03/24/2024    5:43 AM 10/22/2023   10:01 PM 10/22/2023    9:52 PM  CBC  WBC 4.0 - 10.5 K/uL 6.5   10.0   Hemoglobin 12.0 - 15.0 g/dL 04.5  40.9  15.0  Hematocrit 36.0 - 46.0 % 38.2  44.0  44.8   Platelets 150 - 400 K/uL 220   241        Latest Ref Rng & Units 03/24/2024    5:43 AM 10/22/2023   10:01 PM 10/22/2023    9:52 PM  BMP  Glucose 70 - 99 mg/dL 098  119  147   BUN 8 - 23 mg/dL 12  6  8    Creatinine 0.44 - 1.00 mg/dL 8.29  5.62  1.30   Sodium 135 - 145 mmol/L 134  133  133   Potassium 3.5 - 5.1 mmol/L 4.8  3.7  3.5   Chloride 98 - 111 mmol/L 97  102  99   CO2 22 - 32 mmol/L 27   24   Calcium  8.9 - 10.3 mg/dL 9.3   9.2     BNP No results found  for: "BNP"  ProBNP    Component Value Date/Time   PROBNP 121.9 07/28/2014 1623    PFT No results found for: "FEV1PRE", "FEV1POST", "FVCPRE", "FVCPOST", "TLC", "DLCOUNC", "PREFEV1FVCRT", "PSTFEV1FVCRT"  DG Chest Port 1 View Result Date: 03/24/2024 CLINICAL DATA:  Status post bronchoscopy with biopsy of left lower lobe pulmonary nodule EXAM: PORTABLE CHEST 1 VIEW COMPARISON:  CT chest dated 01/21/2024, chest radiograph dated 07/28/2014 FINDINGS: Patient is rotated slightly to the right. Normal lung volumes. Fiducial within the medial left lower lung. Bibasilar hazy opacities. Left greater than right patchy opacities. Trace blunting of bilateral costophrenic angles. No pneumothorax. Enlarged cardiomediastinal silhouette is likely projectional. No acute osseous abnormality. IMPRESSION: 1. No pneumothorax. 2. Trace blunting of bilateral costophrenic angles, which may represent pleural effusions. 3. Left greater than right patchy opacities, likely atelectasis. Electronically Signed   By: Limin  Xu M.D.   On: 03/24/2024 10:54   DG C-ARM BRONCHOSCOPY Result Date: 03/24/2024 C-ARM BRONCHOSCOPY: Fluoroscopy was utilized by the requesting physician.  No radiographic interpretation.     Past medical hx Past Medical History:  Diagnosis Date   Adrenal adenoma, left 12/02/2020   CT AP 11/16/20:  left adrenal mass measuring approximately 2.7 cm. This is minimally increased in size from prior study and is consistent with a benign adrenal adenoma.    Adrenal nodule (HCC) 04/08/2011   04/08/11 Abdominal CT showed Left adrenal nodule which is tachnically indeterminate but most likely an adenoma.  It is 1.7 cm and 42 HU on portal venous phase image. 31 HU on delayed images.   01/09/2012 Abdominal CT w/ & w/o CM: Stable benign left adrenal adenoma. No further specific follow-up is needed for this finding.     Allergic rhinitis 01/03/2007   Qualifier: Diagnosis of  By: Kivett, Whitney     Allergy    Anemia    hx  of  in 20's   Anxiety    Bipolar disorder (HCC)    Carpal tunnel syndrome 02/14/2007   S/P carpel tunnel release bilaterally.     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 03/28/2018 03/28/2018   COPD WITHOUT EXACERBATION 01/03/2007   Qualifier: Diagnosis of  By: Eleni Griffin  emphysema   Degenerative joint disease of both hips 05/23/2007   CT AP 11/27/20: Thre are degenerative changes of both hips     Diabetes mellitus without complication (HCC)    A1C 6.5 as of 05-2019- diet changes, no meds currently    DISC WITH RADICULOPATHY 01/03/2007  Qualifier: Diagnosis of  By: Eleni Griffin     Emphysema of lung (HCC)    Emphysema of Lung on CT 01/10/2024   Esophageal reflux 01/03/2007   Centricity Description: GASTROESOPHAGEAL REFLUX, NO ESOPHAGITIS Qualifier: Diagnosis of  By: Eleni Griffin   Centricity Description: GERD Qualifier: Diagnosis of  By: Marlane Silver MD, Weston     External hemorrhoids s/p hemorrhoidectomy 03/28/2018 03/28/2018   Facial pain 09/20/2022   Family history of breast cancer    Family history of cancer of female genital organ    Family history of colon cancer    Family history of gene mutation    BRIP1 gene mutation (c.2010dup) in daughter   Family history of lung cancer    Fibromyalgia    Fracture of bone spur of inferior portion of calcaneus 10/06/2018   Functional gastrointestinal disorder 06/18/2008   Functional Gastrointestinal Disorder with frequent eructation and bloating sensation with acute flares.  Responsive to Compazine  and Bentyl .  Takes a few days to calm down.     GERD without esophagitis 01/03/2007   Centricity Description: GASTROESOPHAGEAL REFLUX, NO ESOPHAGITIS Qualifier: Diagnosis of  By: Eleni Griffin   Centricity Description: GERD Qualifier: Diagnosis of  By: Marlane Silver MD, Weston     Hearing impairment 04/28/2021   Heel spur,  fracture, left 04/17/2018   HEMORRHOIDS, NOS 01/03/2007   Qualifier: Diagnosis of  By: Eleni Griffin     Hepatic steatosis 04/12/2011   Herpes simplex labialis 05/11/2011   Hip osteoarthritis 05/23/2007   MRI Pelvis (03/13/07) Bilateral osteoarthritis of the hips, left greater than right.  The  patient has a slightly more prominent hip effusion on the left than  the right.  No other significant abnormality.     History of MRSA infection 04/28/2011   HYPERCHOLESTEROLEMIA 12/24/2008   Hypertension    HYPERTENSION, BENIGN SYSTEMIC 01/03/2007   Qualifier: Diagnosis of  By: Eleni Griffin     HYPERTRIGLYCERIDEMIA 11/11/2007   Qualifier: Diagnosis of  By: McDiarmid MD, Todd     Hypokalemia 12/12/2017   Hypothyroidism 02/15/2007   Qualifier: Diagnosis of  By: McDiarmid MD, Gaylin Ke, BORDERLINE 02/15/2007   Insomnia 03/03/2021   Intra-abdominal adhesions    Irritable bowel syndrome 01/03/2007   Qualifier: Diagnosis of  By: Eleni Griffin     Knee pain, acute 12/01/2022   LACTOSE INTOLERANCE 03/10/2008   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   Leukoplakia of oral cavity 09/20/2022   Major depressive disorder, recurrent episode (HCC) 01/03/2007   Qualifier: Diagnosis of  By: Kivett, Whitney     Medication management 12/14/2016   Managing topiramate  for weight loss starting 12/14/16   Memory difficulties 03/04/2021   Metabolic syndrome 02/29/2012   Monoallelic mutation of BRIP1 gene 01/17/2021   c.2010dup (p.[id number]*)   NAFL (nonalcoholic fatty liver) 04/12/2011   Mild hepatic Steatosis on CT 04/08/11 for abdominal pain.      No impairment of memory 04/29/2021   Obesity (BMI 30.0-34.9) 09/30/2007   Has lost 55 lbs since easter. Goal of <190 by 08/05/12.      Obesity, Class III, BMI 40-49.9 (morbid obesity) 09/30/2007        Osteoarthritis of spine 01/03/2007   MR I Lumbar (08/03): GeneralDDD;  L2-3 Large  HNP.  Mod pos  hnp - 12/14/2005, smhnpw/irritL4root - 12/14/2005     OSTEOARTHRITIS, HANDS, BILATERAL 06/18/2008   Qualifier: Diagnosis of  By: McDiarmid MD, Ena Harries  PATELLO FEMORAL STRESS SYNDROME 01/03/2007   Qualifier: Diagnosis of  By: Eleni Griffin     Periodic limb movements of sleep 12/05/2010   Diagnosed on Polysomnography testing Fall 2011.      Pneumonia    walking pneumonia in the 90's   Pre-diabetes 12/25/2008   Qualifier: Diagnosis of  By: McDiarmid MD, Todd     Prolapsed internal hemorrhoids, grade 3, s/p ligation/pexy/hemorrhoidectomy 03/28/2018 03/28/2018   Pulmonary nodules/lesions, multiple 03/02/2023   Pyriformis syndrome, right 03/02/2023   RESTLESS LEGS SYNDROME 09/11/2007   Polysomnography (10/2010, Dr Dianne Fortune, interpreter. ) showed periodic limb movements that frequently awoke patient from sleep with arousal index of 4 per hour. Dr Roma Close (Sleep Specialist) thought her RLS was the major contributor to patient poor sleep condition, more than any sleep apnea.  He recommended considering opamine agonist (either requip or mirapex) to see if this will help.      Rosacea, acne 10/24/2011   SIGMOID POLYP 01/23/2008   Solitary lung nodule 04/08/2011   Stage 1 mild COPD by GOLD classification (HCC) 01/03/2007   11/28/2017 Chest CT: Mild centrilobular and paraseptal emphysema with mild diffuse bronchial wall thickening     Stress incontinence    Synovial cyst of lumbar facet joint 02/17/2016   Trigger thumb of left hand 08/16/2018   Urticaria, chronic 05/22/2011   VITAMIN D  DEFICIENCY 03/03/2010   Work-related condition 05/17/2018     Social History   Tobacco Use   Smoking status: Former    Current packs/day: 0.00    Average packs/day: 1.5 packs/day for 44.0 years (66.0 ttl pk-yrs)    Types: Cigarettes    Start date: 04/06/1964    Quit date: 04/06/2008    Years since quitting: 15.9    Passive exposure: Past   Smokeless tobacco: Never  Vaping Use    Vaping status: Never Used  Substance Use Topics   Alcohol use: No   Drug use: Not Currently    Types: Marijuana    Ms.Minogue reports that she quit smoking about 15 years ago. Her smoking use included cigarettes. She started smoking about 60 years ago. She has a 66 pack-year smoking history. She has been exposed to tobacco smoke. She has never used smokeless tobacco. She reports that she does not currently use drugs after having used the following drugs: Marijuana. She reports that she does not drink alcohol.  Tobacco Cessation: Former smoker, quity 15 years ago 04/06/2008   Past surgical hx, Family hx, Social hx all reviewed.  Current Outpatient Medications on File Prior to Visit  Medication Sig   atorvastatin  (LIPITOR) 20 MG tablet TAKE 1 TABLET BY MOUTH EVERY DAY   buPROPion (ZYBAN) 150 MG 12 hr tablet Take 150 mg by mouth 2 (two) times daily.   Calcium  Carb-Cholecalciferol (CALCIUM -VITAMIN D ) 600-400 MG-UNIT TABS Take 1 tablet by mouth daily.   citalopram  (CELEXA ) 20 MG tablet Take 20 mg by mouth at bedtime.   Continuous Glucose Sensor (FREESTYLE LIBRE 14 DAY SENSOR) MISC APPLY EVERY 14 DAYS   dicyclomine  (BENTYL ) 20 MG tablet Take 1 tablet (20 mg total) by mouth every 8 (eight) hours as needed for spasms   fexofenadine  (ALLEGRA ) 180 MG tablet Take 180 mg by mouth daily.   fluticasone (FLONASE) 50 MCG/ACT nasal spray Place 2 sprays into both nostrils daily.   hydrochlorothiazide  (HYDRODIURIL ) 25 MG tablet TAKE 1 TABLET BY MOUTH EVERY DAY   HYDROcodone -acetaminophen  (NORCO) 10-325 MG tablet Take 1 tablet by mouth every 6 (six) hours as  needed for moderate pain (pain score 4-6).   KLOR-CON  M10 10 MEQ tablet TAKE 4 TABLETS (40 MEQ TOTAL) BY MOUTH AT BEDTIME.   levothyroxine  (SYNTHROID ) 88 MCG tablet TAKE 1 TABLET (88 MCG TOTAL) BY MOUTH EVERY MORNING. 30 MINUTES BEFORE FOOD   LORazepam  (ATIVAN ) 0.5 MG tablet Take 0.5 mg by mouth 3 (three) times daily as needed.   metoprolol  tartrate  (LOPRESSOR ) 50 MG tablet TAKE 1 TABLET BY MOUTH TWICE A DAY   morphine  (MS CONTIN ) 15 MG 12 hr tablet Take 1 tablet (15 mg total) by mouth every 12 (twelve) hours.   Multiple Vitamins-Minerals (MULTIVITAMIN WITH MINERALS) tablet Take 1 tablet by mouth daily.   omeprazole  (PRILOSEC) 40 MG capsule TAKE 1 CAPSULE BY MOUTH EVERY DAY   polyethylene glycol powder (GLYCOLAX /MIRALAX ) 17 GM/SCOOP powder Take 17 g by mouth as needed for mild constipation.   prazosin  (MINIPRESS ) 2 MG capsule Take 2 mg by mouth at bedtime.   Probiotic Product (PROBIOTIC DAILY PO) Take 1 capsule by mouth daily.   promethazine  (PHENERGAN ) 25 MG tablet Take 0.5 tablets (12.5 mg total) by mouth every 8 (eight) hours as needed for nausea or vomiting.   ramipril  (ALTACE ) 10 MG capsule TAKE 1 CAPSULE BY MOUTH EVERY DAY   senna-docusate (SENOKOT-S) 8.6-50 MG tablet Take 2 tablets by mouth at bedtime. For AFTER surgery, do not take if having diarrhea   solifenacin  (VESICARE ) 5 MG tablet TAKE 1 TABLET (5 MG TOTAL) BY MOUTH DAILY.   tiZANidine  (ZANAFLEX ) 2 MG tablet Take 2 mg by mouth once.   traZODone  (DESYREL ) 50 MG tablet Take 100 mg by mouth at bedtime.   vitamin B-12 (CYANOCOBALAMIN ) 1000 MCG tablet Take 1,000 mcg by mouth daily.   No current facility-administered medications on file prior to visit.     Allergies  Allergen Reactions   Diclofenac-Misoprostol Shortness Of Breath    elevated blood pressure   Aripiprazole     Doesn't want to take because of possible side effects   Emsam [Selegiline] Other (See Comments)    Burned the skin after using for a year   Estradiol Swelling   Ibuprofen Hives   Naproxen Nausea Only   Neurontin [Gabapentin] Hives and Swelling   Paroxetine Other (See Comments)    Panic attacks   Prednisone Other (See Comments)    Severe depression   Tape Other (See Comments)    Paper tape caused blisters, plastic tape ok    Review Of Systems:  Constitutional:   No  weight loss, night sweats,   Fevers, chills, fatigue, or  lassitude.  HEENT:   No headaches,  Difficulty swallowing,  Tooth/dental problems, or  Sore throat,                No sneezing, itching, ear ache, nasal congestion, post nasal drip,   CV:  No chest pain,  Orthopnea, PND, swelling in lower extremities, anasarca, dizziness, palpitations, syncope.   GI  No heartburn, indigestion, abdominal pain, nausea, vomiting, diarrhea, change in bowel habits, loss of appetite, bloody stools.   Resp: No shortness of breath with exertion or at rest.  + excess mucus, + productive cough,  No non-productive cough,  No coughing up of blood.  No change in color of mucus.  No wheezing.  No chest wall deformity  Skin: no rash or lesions.  GU: no dysuria, change in color of urine, no urgency or frequency.  No flank pain, no hematuria   MS:  No joint pain or swelling.  No decreased range of motion.  No back pain.  Psych:  No change in mood or affect. No depression or anxiety.  No memory loss.   Vital Signs BP (!) 156/90 (BP Location: Right Wrist, Cuff Size: Normal)   Pulse (!) 58   Ht 5\' 4"  (1.626 m)   Wt 253 lb (114.8 kg)   SpO2 94%   BMI 43.43 kg/m    Physical Exam:  General- No distress,  A&Ox3, pleasant ENT: No sinus tenderness, TM clear, pale nasal mucosa, no oral exudate,no post nasal drip, no LAN Cardiac: S1, S2, regular rate and rhythm, no murmur Chest: No wheeze/ rales/ dullness; no accessory muscle use, no nasal flaring, no sternal retractions, slightly diminished per bases Abd.: Soft Non-tender, ND, BS +, Body mass index is 43.43 kg/m.  Ext: No clubbing cyanosis, edema Neuro: physically deconditioned, walks with a cane, MAE x 4, A&O x 3, appropriate Skin: No rashes, warm and dry, no obvious skin lesions  Psych: normal mood and behavior   Assessment/Plan Enlarging 13.2 mm irregular left lower lobe nodule. Lung-RADS 4B, suspicious. Former smoker quit 2010 Very low uptake on PET Post bronchoscopy with  biopsies Plan I am glad you have done well since the procedure. Your biopsy was positive for non small cell lung cancer, adenocarcinoma. I have referred you for consult with both radiation oncology and medical oncology to discuss treatment options.  You will get phone calls from both to get these appointments scheduled. I have ordered an MRI Brain to complete staging. You will get a cal to get this scheduled.  We will send in a prescription for a z pack. Take 2 tablets day one, and then one tablet daily x the next 4 days.  Please take probiotic with antibiotics while on antibiotics.  Call if you need us  . You will get great care at the [facility]  Please contact office for sooner follow up if symptoms do not improve or worsen or seek emergency care     I spent 35 minutes dedicated to the care of this patient on the date of this encounter to include pre-visit review of records, face-to-face time with the patient discussing conditions above, post visit ordering of testing, clinical documentation with the electronic health record, making appropriate referrals as documented, and communicating necessary information to the patient's healthcare team.     Raejean Bullock, NP 04/01/2024  4:24 PM

## 2024-04-03 ENCOUNTER — Other Ambulatory Visit: Payer: Self-pay

## 2024-04-03 ENCOUNTER — Ambulatory Visit: Admitting: Skilled Nursing Facility1

## 2024-04-04 NOTE — Progress Notes (Signed)
 The proposed treatment discussed in conference is for discussion purpose only and is not a binding recommendation.  The patients have not been physically examined, or presented with their treatment options.  Therefore, final treatment plans cannot be decided.

## 2024-04-06 DIAGNOSIS — C3490 Malignant neoplasm of unspecified part of unspecified bronchus or lung: Secondary | ICD-10-CM | POA: Insufficient documentation

## 2024-04-07 NOTE — Progress Notes (Signed)
 Thoracic Location of Tumor / Histology:  Left lung lower lobe  Patient presented as referral from Dr. Racheal Buddle [facility])New diagnosis adenocarcinoma of the LLL.  Biopsies  BRONCHOSCOPY, WITH BIOPSY USING ELECTROMAGNETIC NAVIGATION (Bilateral)   03/24/2024 Dr. Racheal Buddle DG Chest Port 1 View CLINICAL DATA:  Status post bronchoscopy with biopsy of left lower lobe pulmonary nodule.  IMPRESSION: 1. No pneumothorax. 2. Trace blunting of bilateral costophrenic angles, which may represent pleural effusions. 3. Left greater than right patchy opacities, likely atelectasis.   02/25/2024 [name], NP NM PET Image Initial (PI) Skull Base to Thigh CLINICAL DATA:  Initial treatment strategy for lung nodule.   IMPRESSION: No areas of abnormal radiotracer uptake.  The lung nodules are again seen and do not specifically have any abnormal uptake. Again there are 2 left lower lobe nodules which have been increasing in size over time. Options would include continued surveillance with CT in 3 months versus a more aggressive approach with sampling.  Tiny pleural effusions.  Fatty liver infiltration.  Past/Anticipated interventions by cardiothoracic surgery, if any:  03/24/2024 Dr. Racheal Buddle [name] is a 73 y.o. female with history of tobacco use.  She was found to have evolving left lower lobe pulmonary nodules on CT scan of the chest.  Recommendation made to achieve a tissue diagnosis via robotic assisted navigational bronchoscopy.  The risks, benefits, complications, treatment options and expected outcomes were discussed with the patient.  The possibilities of pneumothorax, pneumonia, reaction to medication, pulmonary aspiration, perforation of a viscus, bleeding, failure to diagnose a condition and creating a complication requiring transfusion or operation were discussed with the patient who freely signed the consent   Past/Anticipated interventions by medical oncology, if  any: NA  Tobacco/Marijuana/Snuff/ETOH use: Former smoker, and marijuana user, and no alcohol use.  Signs/Symptoms Weight changes, if any:  No Respiratory complaints, if any:  Hemoptysis, if any: No Pain issues, if any:  Right sciatica hip chronic pain is on pain medication  SAFETY ISSUES: Prior radiation? No Pacemaker/ICD?  No Possible current pregnancy?  Hysterectomy Is the patient on methotrexate? No  Current Complaints / other details:     BP (!) 150/64 (Patient Position: Sitting)   Pulse (!) 57   Temp (!) 97.1 F (36.2 C) (Temporal)   Resp 20   Ht 5\' 4"  (1.626 m)   Wt 256 lb 4 oz (116.2 kg)   SpO2 96%   BMI 43.99 kg/m

## 2024-04-08 ENCOUNTER — Ambulatory Visit (HOSPITAL_COMMUNITY)
Admission: RE | Admit: 2024-04-08 | Discharge: 2024-04-08 | Disposition: A | Discharge status: Home / Self Care | Source: Ambulatory Visit | Attending: Acute Care | Admitting: Acute Care

## 2024-04-08 DIAGNOSIS — C3490 Malignant neoplasm of unspecified part of unspecified bronchus or lung: Secondary | ICD-10-CM

## 2024-04-08 MED ORDER — GADOBUTROL 1 MMOL/ML IV SOLN
10.0000 mL | Freq: Once | INTRAVENOUS | Status: AC | PRN
  Administered 2024-04-08: 10 mL via INTRAVENOUS

## 2024-04-09 ENCOUNTER — Encounter: Payer: Self-pay | Admitting: Radiation Oncology

## 2024-04-09 ENCOUNTER — Other Ambulatory Visit: Payer: Self-pay

## 2024-04-09 DIAGNOSIS — C3430 Malignant neoplasm of lower lobe, unspecified bronchus or lung: Secondary | ICD-10-CM

## 2024-04-09 LAB — HM DIABETES EYE EXAM

## 2024-04-10 ENCOUNTER — Ambulatory Visit
Admission: RE | Admit: 2024-04-10 | Discharge: 2024-04-10 | Disposition: A | Discharge status: Home / Self Care | Source: Ambulatory Visit | Attending: Radiation Oncology | Admitting: Radiation Oncology

## 2024-04-10 ENCOUNTER — Inpatient Hospital Stay: Attending: Internal Medicine

## 2024-04-10 ENCOUNTER — Inpatient Hospital Stay (HOSPITAL_BASED_OUTPATIENT_CLINIC_OR_DEPARTMENT_OTHER): Admitting: Internal Medicine

## 2024-04-10 ENCOUNTER — Encounter: Payer: Self-pay | Admitting: Family Medicine

## 2024-04-10 VITALS — BP 150/64 | HR 57 | Temp 97.1°F | Resp 20 | Ht 64.0 in | Wt 256.2 lb

## 2024-04-10 VITALS — BP 122/59 | HR 52 | Temp 97.3°F | Resp 16 | Ht 64.0 in | Wt 254.1 lb

## 2024-04-10 DIAGNOSIS — M797 Fibromyalgia: Secondary | ICD-10-CM | POA: Diagnosis not present

## 2024-04-10 DIAGNOSIS — J432 Centrilobular emphysema: Secondary | ICD-10-CM | POA: Diagnosis not present

## 2024-04-10 DIAGNOSIS — Z86018 Personal history of other benign neoplasm: Secondary | ICD-10-CM | POA: Diagnosis not present

## 2024-04-10 DIAGNOSIS — Z809 Family history of malignant neoplasm, unspecified: Secondary | ICD-10-CM | POA: Diagnosis not present

## 2024-04-10 DIAGNOSIS — I10 Essential (primary) hypertension: Secondary | ICD-10-CM

## 2024-04-10 DIAGNOSIS — C3430 Malignant neoplasm of lower lobe, unspecified bronchus or lung: Secondary | ICD-10-CM

## 2024-04-10 DIAGNOSIS — E1159 Type 2 diabetes mellitus with other circulatory complications: Secondary | ICD-10-CM

## 2024-04-10 DIAGNOSIS — G894 Chronic pain syndrome: Secondary | ICD-10-CM | POA: Diagnosis not present

## 2024-04-10 DIAGNOSIS — M19041 Primary osteoarthritis, right hand: Secondary | ICD-10-CM | POA: Insufficient documentation

## 2024-04-10 DIAGNOSIS — Z79899 Other long term (current) drug therapy: Secondary | ICD-10-CM | POA: Insufficient documentation

## 2024-04-10 DIAGNOSIS — Z79890 Hormone replacement therapy (postmenopausal): Secondary | ICD-10-CM | POA: Diagnosis not present

## 2024-04-10 DIAGNOSIS — M549 Dorsalgia, unspecified: Secondary | ICD-10-CM | POA: Insufficient documentation

## 2024-04-10 DIAGNOSIS — E785 Hyperlipidemia, unspecified: Secondary | ICD-10-CM | POA: Insufficient documentation

## 2024-04-10 DIAGNOSIS — Z803 Family history of malignant neoplasm of breast: Secondary | ICD-10-CM | POA: Insufficient documentation

## 2024-04-10 DIAGNOSIS — K760 Fatty (change of) liver, not elsewhere classified: Secondary | ICD-10-CM | POA: Diagnosis not present

## 2024-04-10 DIAGNOSIS — C3432 Malignant neoplasm of lower lobe, left bronchus or lung: Secondary | ICD-10-CM | POA: Insufficient documentation

## 2024-04-10 DIAGNOSIS — E119 Type 2 diabetes mellitus without complications: Secondary | ICD-10-CM | POA: Diagnosis not present

## 2024-04-10 DIAGNOSIS — G2581 Restless legs syndrome: Secondary | ICD-10-CM | POA: Insufficient documentation

## 2024-04-10 DIAGNOSIS — K219 Gastro-esophageal reflux disease without esophagitis: Secondary | ICD-10-CM | POA: Diagnosis not present

## 2024-04-10 DIAGNOSIS — Z83719 Family history of colon polyps, unspecified: Secondary | ICD-10-CM

## 2024-04-10 DIAGNOSIS — Z801 Family history of malignant neoplasm of trachea, bronchus and lung: Secondary | ICD-10-CM

## 2024-04-10 DIAGNOSIS — Z808 Family history of malignant neoplasm of other organs or systems: Secondary | ICD-10-CM | POA: Insufficient documentation

## 2024-04-10 DIAGNOSIS — Z87891 Personal history of nicotine dependence: Secondary | ICD-10-CM | POA: Insufficient documentation

## 2024-04-10 DIAGNOSIS — Z800 Family history of malignant neoplasm of digestive organs: Secondary | ICD-10-CM | POA: Insufficient documentation

## 2024-04-10 DIAGNOSIS — G8929 Other chronic pain: Secondary | ICD-10-CM | POA: Insufficient documentation

## 2024-04-10 DIAGNOSIS — F319 Bipolar disorder, unspecified: Secondary | ICD-10-CM | POA: Diagnosis not present

## 2024-04-10 DIAGNOSIS — E039 Hypothyroidism, unspecified: Secondary | ICD-10-CM | POA: Diagnosis not present

## 2024-04-10 DIAGNOSIS — J449 Chronic obstructive pulmonary disease, unspecified: Secondary | ICD-10-CM | POA: Insufficient documentation

## 2024-04-10 DIAGNOSIS — Z8614 Personal history of Methicillin resistant Staphylococcus aureus infection: Secondary | ICD-10-CM | POA: Diagnosis not present

## 2024-04-10 DIAGNOSIS — M19042 Primary osteoarthritis, left hand: Secondary | ICD-10-CM | POA: Insufficient documentation

## 2024-04-10 DIAGNOSIS — C3490 Malignant neoplasm of unspecified part of unspecified bronchus or lung: Secondary | ICD-10-CM

## 2024-04-10 DIAGNOSIS — E782 Mixed hyperlipidemia: Secondary | ICD-10-CM | POA: Insufficient documentation

## 2024-04-10 LAB — CBC WITH DIFFERENTIAL (CANCER CENTER ONLY)
Abs Immature Granulocytes: 0.02 10*3/uL (ref 0.00–0.07)
Basophils Absolute: 0 10*3/uL (ref 0.0–0.1)
Basophils Relative: 1 %
Eosinophils Absolute: 0.2 10*3/uL (ref 0.0–0.5)
Eosinophils Relative: 3 %
HCT: 37.3 % (ref 36.0–46.0)
Hemoglobin: 12.7 g/dL (ref 12.0–15.0)
Immature Granulocytes: 0 %
Lymphocytes Relative: 31 %
Lymphs Abs: 2.4 10*3/uL (ref 0.7–4.0)
MCH: 30.7 pg (ref 26.0–34.0)
MCHC: 34 g/dL (ref 30.0–36.0)
MCV: 90.1 fL (ref 80.0–100.0)
Monocytes Absolute: 0.9 10*3/uL (ref 0.1–1.0)
Monocytes Relative: 12 %
Neutro Abs: 4.2 10*3/uL (ref 1.7–7.7)
Neutrophils Relative %: 53 %
Platelet Count: 237 10*3/uL (ref 150–400)
RBC: 4.14 MIL/uL (ref 3.87–5.11)
RDW: 12.8 % (ref 11.5–15.5)
WBC Count: 7.7 10*3/uL (ref 4.0–10.5)
nRBC: 0 % (ref 0.0–0.2)

## 2024-04-10 LAB — CMP (CANCER CENTER ONLY)
ALT: 19 U/L (ref 0–44)
AST: 20 U/L (ref 15–41)
Albumin: 4.4 g/dL (ref 3.5–5.0)
Alkaline Phosphatase: 50 U/L (ref 38–126)
Anion gap: 5 (ref 5–15)
BUN: 13 mg/dL (ref 8–23)
CO2: 30 mmol/L (ref 22–32)
Calcium: 9.2 mg/dL (ref 8.9–10.3)
Chloride: 97 mmol/L — ABNORMAL LOW (ref 98–111)
Creatinine: 0.74 mg/dL (ref 0.44–1.00)
GFR, Estimated: >60 mL/min (ref >60)
Glucose, Bld: 93 mg/dL (ref 70–99)
Potassium: 4 mmol/L (ref 3.5–5.1)
Sodium: 132 mmol/L — ABNORMAL LOW (ref 135–145)
Total Bilirubin: 0.4 mg/dL (ref 0.0–1.2)
Total Protein: 6.9 g/dL (ref 6.5–8.1)

## 2024-04-10 NOTE — Progress Notes (Signed)
 Radiation Oncology         [phone] ________________________________  Initial outpatient Consultation  Name: Yadhira Mckneely MRN: [id number]  Date of Service: 04/10/2024 DOB: [birth year]  CC:McDiarmid, Demetra Filter, MD  [name], MD   REFERRING PHYSICIAN: [name], MD  DIAGNOSIS:  73 y/o woman with a newly diagnosed stage IA2, NSCLC, adenocarcinoma of the LLL lung    ICD-10-CM   1. Non-small cell cancer of lower lobe of lung (HCC)  C34.30       HISTORY OF PRESENT ILLNESS: Hye Trawick is a 73 y.o. female seen at the request of Dr. Baldwin Levee.  She is a previous smoker and followed in the lung cancer screening program.  On recent low-dose screening CT chest from 01/21/2024, she was noted to have an enlarging, 13.2 mm LLL nodule as well as a 17.9 mm RLL nodule and a 7 mm LLL nodule, more superior, that were stable in size as compared to previous imaging.      A PET scan was performed on 02/25/2024 and showed only very low level uptake in the RLL nodule and no abnormal uptake in the LLL nodules.     She reviewed the results with Dr. Baldwin Levee and the recommendation to either proceed with bronchoscopy for biopsy or closely monitor with repeat imaging in 3 months.  She has previously lost her husband to lung cancer so she preferred to be more aggressive with evaluation and therefore proceeded with bronchoscopy for biopsy of the enlarging LLL nodule on 03/24/2024.  Final cytology confirmed NSCLC, adenocarcinoma in the LLL nodule.  An MRI brain scan was performed on 04/08/2024 and is without any evidence of intracranial abnormality or abnormal enhancement to suggest metastatic disease in the brain. She has been kindly referred to us  today to discuss the potential role for radiotherapy in the management of early-stage NSCLC.  She will also meet with Dr. Liam Redhead, in medical oncology, at 2:15p this afternoon.  She is accompanied by her daughter, Edwina Gram, for today's visit.  PREVIOUS RADIATION  THERAPY: No  PAST MEDICAL HISTORY:  Past Medical History:  Diagnosis Date   Adrenal adenoma, left 12/02/2020   CT AP 11/16/20:  left adrenal mass measuring approximately 2.7 cm. This is minimally increased in size from prior study and is consistent with a benign adrenal adenoma.    Adrenal nodule (HCC) 04/08/2011   04/08/11 Abdominal CT showed Left adrenal nodule which is tachnically indeterminate but most likely an adenoma.  It is 1.7 cm and 42 HU on portal venous phase image. 31 HU on delayed images.   01/09/2012 Abdominal CT w/ & w/o CM: Stable benign left adrenal adenoma. No further specific follow-up is needed for this finding.     Allergic rhinitis 01/03/2007   Qualifier: Diagnosis of  By: Kivett, Whitney     Allergy    Anemia    hx of  in 20's   Anxiety    Bipolar disorder (HCC)    Carpal tunnel syndrome 02/14/2007   S/P carpel tunnel release bilaterally.     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 03/28/2018 03/28/2018   COPD WITHOUT EXACERBATION 01/03/2007   Qualifier: Diagnosis of  By: Eleni Griffin  emphysema   Degenerative joint disease of both hips 05/23/2007   CT AP 11/27/20: Thre are degenerative changes of both hips     Diabetes mellitus without  complication (HCC)    A1C 6.5 as of 05-2019- diet changes, no meds currently    DISC WITH RADICULOPATHY 01/03/2007   Qualifier: Diagnosis of  By: Eleni Griffin     Emphysema of lung (HCC)    Emphysema of Lung on CT 01/10/2024   Esophageal reflux 01/03/2007   Centricity Description: GASTROESOPHAGEAL REFLUX, NO ESOPHAGITIS Qualifier: Diagnosis of  By: Eleni Griffin   Centricity Description: GERD Qualifier: Diagnosis of  By: Marlane Silver MD, Weston     External hemorrhoids s/p hemorrhoidectomy 03/28/2018 03/28/2018   Facial pain 09/20/2022   Family history of breast cancer    Family history of cancer of female genital  organ    Family history of colon cancer    Family history of gene mutation    BRIP1 gene mutation (c.2010dup) in daughter   Family history of lung cancer    Fibromyalgia    Fracture of bone spur of inferior portion of calcaneus 10/06/2018   Functional gastrointestinal disorder 06/18/2008   Functional Gastrointestinal Disorder with frequent eructation and bloating sensation with acute flares.  Responsive to Compazine  and Bentyl .  Takes a few days to calm down.     GERD without esophagitis 01/03/2007   Centricity Description: GASTROESOPHAGEAL REFLUX, NO ESOPHAGITIS Qualifier: Diagnosis of  By: Eleni Griffin   Centricity Description: GERD Qualifier: Diagnosis of  By: Marlane Silver MD, Weston     Hearing impairment 04/28/2021   Heel spur, fracture, left 04/17/2018   HEMORRHOIDS, NOS 01/03/2007   Qualifier: Diagnosis of  By: Eleni Griffin     Hepatic steatosis 04/12/2011   Herpes simplex labialis 05/11/2011   Hip osteoarthritis 05/23/2007   MRI Pelvis (03/13/07) Bilateral osteoarthritis of the hips, left greater than right.  The  patient has a slightly more prominent hip effusion on the left than  the right.  No other significant abnormality.     History of MRSA infection 04/28/2011   HYPERCHOLESTEROLEMIA 12/24/2008   Hypertension    HYPERTENSION, BENIGN SYSTEMIC 01/03/2007   Qualifier: Diagnosis of  By: Eleni Griffin     HYPERTRIGLYCERIDEMIA 11/11/2007   Qualifier: Diagnosis of  By: McDiarmid MD, Todd     Hypokalemia 12/12/2017   Hypothyroidism 02/15/2007   Qualifier: Diagnosis of  By: McDiarmid MD, Gaylin Ke, BORDERLINE 02/15/2007   Insomnia 03/03/2021   Intra-abdominal adhesions    Irritable bowel syndrome 01/03/2007   Qualifier: Diagnosis of  By: Eleni Griffin     Knee pain, acute 12/01/2022   LACTOSE INTOLERANCE 03/10/2008   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   Leukoplakia of oral  cavity 09/20/2022   Major depressive disorder, recurrent episode (HCC) 01/03/2007   Qualifier: Diagnosis of  By: Kivett, Whitney     Medication management 12/14/2016   Managing topiramate  for weight loss starting 12/14/16   Memory difficulties 03/04/2021   Metabolic syndrome 02/29/2012   Monoallelic mutation of BRIP1 gene 01/17/2021   c.2010dup (p.[id number]*)   NAFL (nonalcoholic fatty liver) 04/12/2011   Mild hepatic Steatosis on CT 04/08/11 for abdominal pain.      No impairment of memory 04/29/2021   Obesity (BMI 30.0-34.9) 09/30/2007   Has lost 55 lbs since easter. Goal of <190 by 08/05/12.      Obesity, Class III, BMI 40-49.9 (morbid obesity) 09/30/2007        Osteoarthritis of spine 01/03/2007   MR I Lumbar (08/03): GeneralDDD; L2-3 Large  HNP.  Mod pos  hnp - 12/14/2005,  smhnpw/irritL4root - 12/14/2005     OSTEOARTHRITIS, HANDS, BILATERAL 06/18/2008   Qualifier: Diagnosis of  By: McDiarmid MD, Alfornia Imam FEMORAL STRESS SYNDROME 01/03/2007   Qualifier: Diagnosis of  By: Eleni Griffin     Periodic limb movements of sleep 12/05/2010   Diagnosed on Polysomnography testing Fall 2011.      Pneumonia    walking pneumonia in the 90's   Pre-diabetes 12/25/2008   Qualifier: Diagnosis of  By: McDiarmid MD, Todd     Prolapsed internal hemorrhoids, grade 3, s/p ligation/pexy/hemorrhoidectomy 03/28/2018 03/28/2018   Pulmonary nodules/lesions, multiple 03/02/2023   Pyriformis syndrome, right 03/02/2023   RESTLESS LEGS SYNDROME 09/11/2007   Polysomnography (10/2010, Dr Dianne Fortune, interpreter. ) showed periodic limb movements that frequently awoke patient from sleep with arousal index of 4 per hour. Dr Roma Close (Sleep Specialist) thought her RLS was the major contributor to patient poor sleep condition, more than any sleep apnea.  He recommended considering opamine agonist (either requip or mirapex) to see if this will help.      Rosacea, acne 10/24/2011   SIGMOID POLYP 01/23/2008    Solitary lung nodule 04/08/2011   Stage 1 mild COPD by GOLD classification (HCC) 01/03/2007   11/28/2017 Chest CT: Mild centrilobular and paraseptal emphysema with mild diffuse bronchial wall thickening     Stress incontinence    Synovial cyst of lumbar facet joint 02/17/2016   Trigger thumb of left hand 08/16/2018   Urticaria, chronic 05/22/2011   VITAMIN D  DEFICIENCY 03/03/2010   Work-related condition 05/17/2018      PAST SURGICAL HISTORY: Past Surgical History:  Procedure Laterality Date   APPENDECTOMY     bladder tack  1998   Dr Inga Manges (urology)   BRONCHIAL BIOPSY  03/24/2024   Procedure: BRONCHOSCOPY, WITH BIOPSY;  Surgeon: [name], MD;  Location: [facility] ENDOSCOPY;  Service: Pulmonary;;   BRONCHIAL BRUSHINGS  03/24/2024   Procedure: BRONCHOSCOPY, WITH BRUSH BIOPSY;  Surgeon: [name], MD;  Location: [facility] ENDOSCOPY;  Service: Pulmonary;;   BRONCHIAL NEEDLE ASPIRATION BIOPSY  03/24/2024   Procedure: BRONCHOSCOPY, WITH NEEDLE ASPIRATION BIOPSY;  Surgeon: [name], MD;  Location: [facility] ENDOSCOPY;  Service: Pulmonary;;   BRONCHOSCOPY, WITH BIOPSY USING ELECTROMAGNETIC NAVIGATION Bilateral 03/24/2024   Procedure: BRONCHOSCOPY, WITH BIOPSY USING ELECTROMAGNETIC NAVIGATION;  Surgeon: [name], MD;  Location: [facility] ENDOSCOPY;  Service: Pulmonary;  Laterality: Bilateral;  with Fluoro   BUNIONECTOMY  1992   bilateral feet with pins in great toes   CARDIAC CATHETERIZATION     CARPAL TUNNEL RELEASE  2010   Bilateral, Dr Porfirio Bristol Sypher   COLONOSCOPY  2004   Dr Helen Loa (GI)   CYSTOCELE REPAIR  1998    Dr Alix Aquas (GYN)-   ESOPHAGOGASTRODUODENOSCOPY     EVALUATION UNDER ANESTHESIA WITH HEMORRHOIDECTOMY N/A 03/28/2018   Procedure: ANORECTAL EXAM UNDER ANESTHESIA lateral internal partial sphincterotomy, hemorrhoidal ligation pixie, HEMORRHOIDECTOMY;  Surgeon: Candyce Champagne, MD;  Location: [facility] ORS;  Service: General;  Laterality: N/A;   EXAM ANORECTAL W/ ULTRASOUND     Dr. Hershell Lose  03-28-18    EXPLORATORY LAPAROTOMY     FACIAL LACERATIONS REPAIR     Facial Laceration repair as adolescent    FUDUCIAL PLACEMENT  03/24/2024   Procedure: INSERTION, FIDUCIAL MARKER, GOLD;  Surgeon: [name], MD;  Location: [facility] ENDOSCOPY;  Service: Pulmonary;;   INJECTION HIP INTRA ARTICULAR  2008   Left Hip fluoroscopically-guided corticosteorid injection for painful osteoarthritis with good effect (06/2007)  LAPAROSCOPIC LYSIS OF ADHESIONS  05/19/2021   Procedure: LAPAROSCOPIC LYSIS OF ADHESIONS; BIOPSY ABDOMINAL SKIN LESION;  Surgeon: Suzi Essex, MD;  Location: [facility];  Service: Gynecology;;   LAPAROSCOPY FOR ECTOPIC PREGNANCY     LUMBAR LAMINECTOMY/DECOMPRESSION MICRODISCECTOMY N/A 06/21/2016   Procedure: MICRO LUMBAR DECOMPRESSION L4-L5 AND REMOVAL OF SYNOVIAL CYST    1 LEVEL;  Surgeon: Orvan Blanch, MD;  Location: [facility];  Service: Orthopedics;  Laterality: N/A;   MRSA     NM MYOVIEW  LTD  2011   Dr Kay Parson, III (Card)   RECTOCELE REPAIR  1998   Dr Alix Aquas (GYN)   ROBOTIC ASSISTED SALPINGO OOPHERECTOMY N/A 05/19/2021   Procedure: XI ROBOTIC ASSISTED LEFT SALPINGO- OOPHORECTOMY WITH PELVIC WASHINGS;  Surgeon: Suzi Essex, MD;  Location: [facility];  Service: Gynecology;  Laterality: N/A;   SPHINCTEROTOMY N/A 03/28/2018   Procedure: ANAL SPHINCTEROTOMY;  Surgeon: Candyce Champagne, MD;  Location: [facility];  Service: General;  Laterality: N/A;   UPPER GASTROINTESTINAL ENDOSCOPY      FAMILY HISTORY:  Family History  Problem Relation Age of Onset   Hypertension Mother    Frontotemporal dementia Mother    Diabetes Father    Heart disease Father    Hypertension Sister    Obesity Sister    Kidney disease Brother    Heart disease Brother    Hypertension Brother    Hypertension Daughter    Bipolar disorder Daughter    Hypertension Daughter    Breast cancer Daughter 34   Cancer Daughter 11       vulvar cancer   Other Daughter        BRIP1 gene mutation c.2010dup    Thyroid  disease Daughter    Hypertension Son    Hypertension Son    Colon polyps Son 100   Diabetes Maternal Aunt    Lung cancer Paternal Aunt        d. >50   Cancer Paternal Aunt        unknown type, dx >50   Heart Problems Paternal Grandmother    Diabetes Paternal Grandfather    Cancer Cousin 17       gynecologic cancer NOS (paternal first cousin)   Cancer Niece 83       gynecologic cancer NOS    Cancer Paternal Great-grandmother        abdominal cancer NOS   Diabetes type II Other    Hypertension Other    Heart attack Other    Alcohol abuse Other    Depression Other    Asthma Other    Migraines Other    Breast cancer Other        paternal great-great aunt (PGF's aunt)   Esophageal cancer Neg Hx    Stomach cancer Neg Hx    Rectal cancer Neg Hx    Colon cancer Neg Hx    Prostate cancer Neg Hx    Pancreatic cancer Neg Hx     SOCIAL HISTORY:  Social History   Socioeconomic History   Marital status: Divorced    Spouse name: Not on file   Number of children: 4   Years of education: 12   Highest education level: Some college, no degree  Occupational History   Occupation: disability  Tobacco Use   Smoking status: Former    Current packs/day: 0.00    Average packs/day: 1.5 packs/day for 44.0 years (66.0 ttl pk-yrs)    Types: Cigarettes    Start date: 04/06/1964  Quit date: 04/06/2008    Years since quitting: 16.0    Passive exposure: Past   Smokeless tobacco: Never  Vaping Use   Vaping status: Never Used  Substance and Sexual Activity   Alcohol use: No   Drug use: Not Currently    Types: Marijuana   Sexual activity: Not Currently  Other Topics Concern   Not on file  Social History Narrative   [facility] POA document appoints Deon Hooks as agent for Ms Nimrit Kehres. Batty (DOB 06-Sep-2051)/ T. McDiarmid, MD 04/28/21      Retired [employer]  Par[employer] - early retirement for mental health reasons.    Has Worked part-time at [employer] as a Conservation officer, nature. Patient stopped during Covid.    Patient is divorced.   Quit smoking tobacco June 09 started at age 57. 1 1/2 to 2 ppd history .   No etoh    Hx of smokes marijuana    Religious - Pentecostal   4 children    Hobbies- word searches and games on her tablet.   No IVDA      Social Information (01/11/2022)    patient lives with daughter in one level apartment ,for past 2 years .    Patient enjoys playing games on her ipad and watching movies . Primary family support persons is her daughter.    Transportation to appointments provided by self;  No concerns or difficulty affording medication:   Strengths:Financial means, Religious Affiliation, Supportive family/friends   Support System: Family, Church, Spirituality and Able to TEFL teacher Activities of Daily Living    Dressing:Self-care   Eating: Self-care   Ambulation: Partial assistance   Toileting: Self-care   Bathing: Self-care       Instrumental Activities of Daily Living   Shopping: Partial assistance   House/Yard Work: Self-care   Administration of medications: Self-care   Finances: Self-care   Telephone: Self-care   Transportation: Self-care      Social Drivers of Health   Financial Resource Strain: Medium Risk (11/25/2023)   Overall Financial Resource Strain (CARDIA)    Difficulty of Paying Living Expenses: Somewhat hard  Food Insecurity: Food Insecurity Present (04/09/2024)   Hunger Vital Sign    Worried About Running Out of Food in the Last Year: Sometimes true    Ran Out of Food in the Last Year: Sometimes true  Transportation Needs: No Transportation Needs (04/09/2024)   PRAPARE - Administrator, Civil Service (Medical): No    Lack of Transportation (Non-Medical): No  Physical Activity: Unknown (11/25/2023)   Exercise Vital Sign    Days of Exercise per Week: 0 days    Minutes of Exercise per Session: Not on file  Recent Concern: Physical Activity - Inactive (11/25/2023)   Exercise Vital Sign    Days of Exercise per  Week: 0 days    Minutes of Exercise per Session: 0 min  Stress: Stress Concern Present (11/25/2023)   [employer] of Occupational Health - Occupational Stress Questionnaire    Feeling of Stress : Rather much  Social Connections: Unknown (11/25/2023)   Social Connection and Isolation Panel [NHANES]    Frequency of Communication with Friends and Family: More than three times a week    Frequency of Social Gatherings with Friends and Family: Patient declined    Attends Religious Services: Patient declined    Database administrator or Organizations: No    Attends Banker Meetings: Not on file  Marital Status: Divorced  Catering manager Violence: Not At Risk (04/09/2024)   Humiliation, Afraid, Rape, and Kick questionnaire    Fear of Current or Ex-Partner: No    Emotionally Abused: No    Physically Abused: No    Sexually Abused: No    ALLERGIES: Diclofenac-misoprostol, Aripiprazole, Emsam [selegiline], Estradiol, Ibuprofen, Naproxen, Neurontin [gabapentin], Paroxetine, Prednisone, and Tape  MEDICATIONS:  Current Outpatient Medications  Medication Sig Dispense Refill   traZODone  (DESYREL ) 100 MG tablet Take 200 mg by mouth at bedtime.     atorvastatin  (LIPITOR) 20 MG tablet TAKE 1 TABLET BY MOUTH EVERY DAY 90 tablet 3   azithromycin  (ZITHROMAX ) 250 MG tablet Take 2 tablets on day 1, and then 1 tablet for the next 4 days until gone. 6 tablet 0   buPROPion (ZYBAN) 150 MG 12 hr tablet Take 150 mg by mouth 2 (two) times daily.     Calcium  Carb-Cholecalciferol (CALCIUM -VITAMIN D ) 600-400 MG-UNIT TABS Take 1 tablet by mouth daily.     citalopram  (CELEXA ) 20 MG tablet Take 20 mg by mouth at bedtime.     Continuous Glucose Sensor (FREESTYLE LIBRE 14 DAY SENSOR) MISC APPLY EVERY 14 DAYS 2 each 99   dicyclomine  (BENTYL ) 20 MG tablet Take 1 tablet (20 mg total) by mouth every 8 (eight) hours as needed for spasms 90 tablet 1   fexofenadine  (ALLEGRA ) 180 MG tablet Take 180 mg by mouth  daily.     fluticasone (FLONASE) 50 MCG/ACT nasal spray Place 2 sprays into both nostrils daily.     hydrochlorothiazide  (HYDRODIURIL ) 25 MG tablet TAKE 1 TABLET BY MOUTH EVERY DAY 90 tablet 3   HYDROcodone -acetaminophen  (NORCO) 10-325 MG tablet Take 1 tablet by mouth every 6 (six) hours as needed for moderate pain (pain score 4-6). 52 tablet 0   KLOR-CON  M10 10 MEQ tablet TAKE 4 TABLETS (40 MEQ TOTAL) BY MOUTH AT BEDTIME. 360 tablet 3   levothyroxine  (SYNTHROID ) 88 MCG tablet TAKE 1 TABLET (88 MCG TOTAL) BY MOUTH EVERY MORNING. 30 MINUTES BEFORE FOOD 90 tablet 1   LORazepam  (ATIVAN ) 0.5 MG tablet Take 0.5 mg by mouth 3 (three) times daily as needed.     metoprolol  tartrate (LOPRESSOR ) 50 MG tablet TAKE 1 TABLET BY MOUTH TWICE A DAY 180 tablet 3   morphine  (MS CONTIN ) 15 MG 12 hr tablet Take 1 tablet (15 mg total) by mouth every 12 (twelve) hours. 60 tablet 0   Multiple Vitamins-Minerals (MULTIVITAMIN WITH MINERALS) tablet Take 1 tablet by mouth daily.     omeprazole  (PRILOSEC) 40 MG capsule TAKE 1 CAPSULE BY MOUTH EVERY DAY 90 capsule 3   polyethylene glycol powder (GLYCOLAX /MIRALAX ) 17 GM/SCOOP powder Take 17 g by mouth as needed for mild constipation.     prazosin  (MINIPRESS ) 2 MG capsule Take 2 mg by mouth at bedtime.     Probiotic Product (PROBIOTIC DAILY PO) Take 1 capsule by mouth daily.     promethazine  (PHENERGAN ) 25 MG tablet Take 0.5 tablets (12.5 mg total) by mouth every 8 (eight) hours as needed for nausea or vomiting. 30 tablet 0   ramipril  (ALTACE ) 10 MG capsule TAKE 1 CAPSULE BY MOUTH EVERY DAY 90 capsule 3   senna-docusate (SENOKOT-S) 8.6-50 MG tablet Take 2 tablets by mouth at bedtime. For AFTER surgery, do not take if having diarrhea 30 tablet 0   solifenacin  (VESICARE ) 5 MG tablet TAKE 1 TABLET (5 MG TOTAL) BY MOUTH DAILY. 90 tablet 3   tiZANidine  (ZANAFLEX ) 2 MG tablet Take 2  mg by mouth once.     vitamin B-12 (CYANOCOBALAMIN ) 1000 MCG tablet Take 1,000 mcg by mouth daily.      No current facility-administered medications for this encounter.    REVIEW OF SYSTEMS:  On review of systems, the patient reports that she is doing well overall. She denies any chest pain, increased shortness of breath, productive cough, hemoptysis, fevers, chills, night sweats, or unintended weight changes.  She denies any bowel or bladder disturbances, and denies abdominal pain, nausea or vomiting.  She denies any new musculoskeletal or joint aches or pains but does have chronic low back pain and bilateral hip pain secondary to osteoarthritis. A complete review of systems is obtained and is otherwise negative.    PHYSICAL EXAM:  Wt Readings from Last 3 Encounters:  04/01/24 253 lb (114.8 kg)  03/24/24 252 lb (114.3 kg)  03/03/24 248 lb 12.8 oz (112.9 kg)   Temp Readings from Last 3 Encounters:  03/24/24 97.7 F (36.5 C)  10/23/23 98.7 F (37.1 C) (Oral)  10/18/23 98.4 F (36.9 C) (Oral)   BP Readings from Last 3 Encounters:  04/01/24 (!) 156/90  03/24/24 108/82  03/03/24 (!) 147/78   Pulse Readings from Last 3 Encounters:  04/01/24 (!) 58  03/24/24 68  03/03/24 62    /10  In general this is a well appearing Caucasian woman in no acute distress.  She's alert and oriented x4 and appropriate throughout the examination. Cardiopulmonary assessment is negative for acute distress and she exhibits normal effort.   KPS = 80  100 - Normal; no complaints; no evidence of disease. 90   - Able to carry on normal activity; minor signs or symptoms of disease. 80   - Normal activity with effort; some signs or symptoms of disease. 60   - Cares for self; unable to carry on normal activity or to do active work. 60   - Requires occasional assistance, but is able to care for most of his personal needs. 50   - Requires considerable assistance and frequent medical care. 40   - Disabled; requires special care and assistance. 30   - Severely disabled; hospital admission is indicated although  death not imminent. 20   - Very sick; hospital admission necessary; active supportive treatment necessary. 10   - Moribund; fatal processes progressing rapidly. 0     - Dead  Karnofsky DA, Abelmann WH, Craver LS and Burchenal [facility] [phone]) The use of the nitrogen mustards in the palliative treatment of carcinoma: with particular reference to bronchogenic carcinoma Cancer 1 634-56  LABORATORY DATA:  Lab Results  Component Value Date   WBC 6.5 03/24/2024   HGB 12.6 03/24/2024   HCT 38.2 03/24/2024   MCV 92.7 03/24/2024   PLT 220 03/24/2024   Lab Results  Component Value Date   NA 134 (L) 03/24/2024   K 4.8 03/24/2024   CL 97 (L) 03/24/2024   CO2 27 03/24/2024   Lab Results  Component Value Date   ALT 28 10/22/2023   AST 25 10/22/2023   ALKPHOS 58 10/22/2023   BILITOT 0.7 10/22/2023     RADIOGRAPHY: MR BRAIN W WO CONTRAST Result Date: 04/10/2024 EXAM: MRI BRAIN WITH AND WITHOUT CONTRAST 04/08/2024 02:48:15 PM TECHNIQUE: Multiplanar multisequence MRI of the head/brain was performed with and without the administration of intravenous contrast. COMPARISON: None available. CLINICAL HISTORY: Non-small cell lung cancer (NSCLC), staging. FINDINGS: BRAIN AND VENTRICLES: No acute infarct. No acute intracranial hemorrhage. No mass effect or midline shift.  No hydrocephalus. The sella is unremarkable. Normal flow voids. No mass or abnormal enhancement. Mild atrophy and white matter changes are within normal limits for age. ORBITS: Bilateral lens replacements are noted. The globes and orbits are otherwise within normal limits. SINUSES: No acute abnormality. BONES AND SOFT TISSUES: Normal bone marrow signal and enhancement. No acute soft tissue abnormality. IMPRESSION: 1. No acute intracranial abnormality. 2. No mass or abnormal enhancement to suggest metastatic disease of the brain. Electronically signed by: Audree Leas MD 04/10/2024 07:40 AM EDT RP Workstation: [id number]   DG Chest Port 1  View Result Date: 03/24/2024 CLINICAL DATA:  Status post bronchoscopy with biopsy of left lower lobe pulmonary nodule EXAM: PORTABLE CHEST 1 VIEW COMPARISON:  CT chest dated 01/21/2024, chest radiograph dated 07/28/2014 FINDINGS: Patient is rotated slightly to the right. Normal lung volumes. Fiducial within the medial left lower lung. Bibasilar hazy opacities. Left greater than right patchy opacities. Trace blunting of bilateral costophrenic angles. No pneumothorax. Enlarged cardiomediastinal silhouette is likely projectional. No acute osseous abnormality. IMPRESSION: 1. No pneumothorax. 2. Trace blunting of bilateral costophrenic angles, which may represent pleural effusions. 3. Left greater than right patchy opacities, likely atelectasis. Electronically Signed   By: Limin  Xu M.D.   On: 03/24/2024 10:54   DG C-ARM BRONCHOSCOPY Result Date: 03/24/2024 C-ARM BRONCHOSCOPY: Fluoroscopy was utilized by the requesting physician.  No radiographic interpretation.      IMPRESSION/PLAN: 1. 73 y.o. woman with a newly diagnosed stage IA2, NSCLC, adenocarcinoma of the LLL lung Today, we talked to the patient and family about the findings and workup thus far. We discussed the natural history of early stage lung cancer and general treatment, highlighting the role of radiotherapy in the management. We discussed the available radiation techniques, and focused on the details and logistics of delivery.  She has underlying emphysema among multiple other co-morbidities, which makes her not an ideal surgical candidate. Therefore, the recommendation is for a 3-5 fraction course of stereotactic body radiotherapy (SBRT) to the LLL lung nodule and we will continue to closely monitor the smaller LLL and the RLL lung nodules on serial follow-up imaging.  We reviewed the anticipated acute and late sequelae associated with radiation in this setting. The patient and her daughter, Edwina Gram, were encouraged to ask questions that were  answered to their stated satisfaction.  At the conclusion of our conversation, the patient elects to proceed with the recommended 3-5 fraction course of stereotactic body radiotherapy (SBRT) to the LLL lung nodule.  She has freely signed written consent to proceed today in the office and a copy of this document will be placed in her medical record.  She will meet with Dr. Marguerita Shih following our visit today, to ensure he is in agreement with this plan, but she is tentatively scheduled for CT simulation/treatment planning at 10 AM on Friday, 04/18/2024, in anticipation of beginning her treatments in the near future.  We enjoyed meeting her and her daughter today and look forward to continuing to participate in her care.  They know they are welcome to call at anytime with any questions or concerns in the interim.  We personally spent 60 minutes in this encounter including chart review, reviewing radiological studies, meeting face-to-face with the patient, entering orders and completing documentation.    Arta Bihari, PA-C    Kenith Payer, MD  [facility]  Radiation Oncology Direct Dial: [phone]  Fax: [phone] [email or web address]  Skype  LinkedIn

## 2024-04-10 NOTE — Progress Notes (Signed)
 [name] [phone]   [phone]  CONSULT NOTE  REFERRING PHYSICIAN: Dr. Racheal Buddle  REASON FOR CONSULTATION:  73 years old white female diagnosed with lung cancer  HPI [name] is a 73 y.o. female.   Discussed the use of AI scribe software for clinical note transcription with the patient, who gave verbal consent to proceed.  History of Present Illness   [name] is a 73 year old female with lung cancer who presents for evaluation and management of her condition. She is accompanied by her daughter, [name].  In 2019, a CT scan revealed a nodule in her lung, prompting her to pursue lung cancer screening. She began regular screenings in December 2023, with follow-ups every three to six months. A CT scan on January 21, 2024, showed an enlarging 1.32 cm nodule in the left lower lobe. A subsequent PET scan on February 06, 2024, identified two nodules in the left lower lobe, which were increasing in size. A bronchoscopy and biopsy on Mar 24, 2024, confirmed adenocarcinoma of the lung.  She experiences shortness of breath and a persistent cough with difficulty expectorating. No chest pain, nausea, or vomiting. She has a history of emphysema and quit smoking cigarettes in 2009, though she previously smoked marijuana to aid sleep and pain management, which she has not used in months.  She experiences chronic pain, particularly on the right side, and takes pain medication for lower back pain due to arthritis, sciatica, and fibromyalgia. She has been under psychiatric care for 51 years and notes an improvement in her depression over the past few months.  Her past medical history includes bipolar disorder, COPD, acid reflux, hypertension, hyperlipidemia, fibromyalgia, arthritis, and diabetes. She uses a Sports administrator for diabetes management, with her last check in January showing a level of 6.1.  Family history includes her father having  coronary artery disease, her mother passing from a stroke, and a brother who died from kidney disease. Her daughter had breast cancer and is currently in remission.  She lives with her daughter and has four adult children and thirteen grandchildren. She worked at the [employer] for 25 years but left due to mental health Hernandez after her husband's death. She is single and does not consume alcohol due to her medication regimen. She has a history of smoking since her teenage years but quit in 2009.         Past Medical History:  Diagnosis Date   Adrenal adenoma, left 12/02/2020   CT AP 11/16/20:  left adrenal mass measuring approximately 2.7 cm. This is minimally increased in size from prior study and is consistent with a benign adrenal adenoma.    Adrenal nodule (HCC) 04/08/2011   04/08/11 Abdominal CT showed Left adrenal nodule which is tachnically indeterminate but most likely an adenoma.  It is 1.7 cm and 42 HU on portal venous phase image. 31 HU on delayed images.   01/09/2012 Abdominal CT w/ & w/o CM: Stable benign left adrenal adenoma. No further specific follow-up is needed for this finding.     Allergic rhinitis 01/03/2007   Qualifier: Diagnosis of  By: Kivett, Whitney     Allergy    Anemia    hx of  in 20's   Anxiety    Bipolar disorder (HCC)    Carpal tunnel syndrome 02/14/2007   S/P carpel tunnel release bilaterally.     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 03/28/2018 03/28/2018   COPD WITHOUT EXACERBATION 01/03/2007   Qualifier: Diagnosis of  By: Eleni Griffin  emphysema   Degenerative joint disease of both hips 05/23/2007   CT AP 11/27/20: Thre are degenerative changes of both hips     Diabetes mellitus without complication (HCC)    A1C 6.5 as of 05-2019- diet changes, no meds currently    DISC WITH RADICULOPATHY 01/03/2007   Qualifier: Diagnosis of  By:  Eleni Griffin     Emphysema of lung (HCC)    Emphysema of Lung on CT 01/10/2024   Esophageal reflux 01/03/2007   Centricity Description: GASTROESOPHAGEAL REFLUX, NO ESOPHAGITIS Qualifier: Diagnosis of  By: Eleni Griffin   Centricity Description: GERD Qualifier: Diagnosis of  By: Marlane Silver MD, Weston     External hemorrhoids s/p hemorrhoidectomy 03/28/2018 03/28/2018   Facial pain 09/20/2022   Family history of breast cancer    Family history of cancer of female genital organ    Family history of colon cancer    Family history of gene mutation    BRIP1 gene mutation (c.2010dup) in daughter   Family history of lung cancer    Fibromyalgia    Fracture of bone spur of inferior portion of calcaneus 10/06/2018   Functional gastrointestinal disorder 06/18/2008   Functional Gastrointestinal Disorder with frequent eructation and bloating sensation with acute flares.  Responsive to Compazine  and Bentyl .  Takes a few days to calm down.     GERD without esophagitis 01/03/2007   Centricity Description: GASTROESOPHAGEAL REFLUX, NO ESOPHAGITIS Qualifier: Diagnosis of  By: Eleni Griffin   Centricity Description: GERD Qualifier: Diagnosis of  By: Marlane Silver MD, Weston     Hearing impairment 04/28/2021   Heel spur, fracture, left 04/17/2018   HEMORRHOIDS, NOS 01/03/2007   Qualifier: Diagnosis of  By: Eleni Griffin     Hepatic steatosis 04/12/2011   Herpes simplex labialis 05/11/2011   Hip osteoarthritis 05/23/2007   MRI Pelvis (03/13/07) Bilateral osteoarthritis of the hips, left greater than right.  The  patient has a slightly more prominent hip effusion on the left than  the right.  No other significant abnormality.     History of MRSA infection 04/28/2011   HYPERCHOLESTEROLEMIA 12/24/2008   Hypertension    HYPERTENSION, BENIGN SYSTEMIC 01/03/2007   Qualifier: Diagnosis of  By: Eleni Griffin     HYPERTRIGLYCERIDEMIA 11/11/2007   Qualifier: Diagnosis of  By: McDiarmid MD, Todd     Hypokalemia  12/12/2017   Hypothyroidism 02/15/2007   Qualifier: Diagnosis of  By: McDiarmid MD, Gaylin Ke, BORDERLINE 02/15/2007   Insomnia 03/03/2021   Intra-abdominal adhesions    Irritable bowel syndrome 01/03/2007   Qualifier: Diagnosis of  By: Eleni Griffin     Knee pain, acute 12/01/2022   LACTOSE INTOLERANCE 03/10/2008   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   Leukoplakia of oral cavity 09/20/2022   Major depressive disorder, recurrent episode (HCC) 01/03/2007   Qualifier: Diagnosis of  By: Kivett, Whitney     Medication management 12/14/2016   Managing topiramate  for weight loss starting 12/14/16   Memory difficulties 03/04/2021   Metabolic syndrome 02/29/2012   Monoallelic mutation of BRIP1 gene 01/17/2021   c.2010dup ([id number]*)   NAFL (nonalcoholic fatty liver) 04/12/2011   Mild hepatic Steatosis on CT 04/08/11 for abdominal pain.      No impairment of memory 04/29/2021  Obesity (BMI 30.0-34.9) 09/30/2007   Has lost 55 lbs since easter. Goal of <190 by 08/05/12.      Obesity, Class III, BMI 40-49.9 (morbid obesity) 09/30/2007        Osteoarthritis of spine 01/03/2007   MR I Lumbar (08/03): GeneralDDD; L2-3 Large  HNP.  Mod pos  hnp - 12/14/2005, smhnpw/irritL4root - 12/14/2005     OSTEOARTHRITIS, HANDS, BILATERAL 06/18/2008   Qualifier: Diagnosis of  By: McDiarmid MD, Alfornia Imam FEMORAL STRESS SYNDROME 01/03/2007   Qualifier: Diagnosis of  By: Eleni Griffin     Periodic limb movements of sleep 12/05/2010   Diagnosed on Polysomnography testing Fall 2011.      Pneumonia    walking pneumonia in the 90's   Pre-diabetes 12/25/2008   Qualifier: Diagnosis of  By: McDiarmid MD, Todd     Prolapsed internal hemorrhoids, grade 3, s/p ligation/pexy/hemorrhoidectomy 03/28/2018 03/28/2018   Pulmonary nodules/lesions, multiple 03/02/2023   Pyriformis syndrome, right 03/02/2023   RESTLESS LEGS  SYNDROME 09/11/2007   Polysomnography (10/2010, Dr Dianne Fortune, interpreter. ) showed periodic limb movements that frequently awoke patient from sleep with arousal index of 4 per hour. Dr Roma Close (Sleep Specialist) thought her RLS was the major contributor to patient poor sleep condition, more than any sleep apnea.  He recommended considering opamine agonist (either requip or mirapex) to see if this will help.      Rosacea, acne 10/24/2011   SIGMOID POLYP 01/23/2008   Solitary lung nodule 04/08/2011   Stage 1 mild COPD by GOLD classification (HCC) 01/03/2007   11/28/2017 Chest CT: Mild centrilobular and paraseptal emphysema with mild diffuse bronchial wall thickening     Stress incontinence    Synovial cyst of lumbar facet joint 02/17/2016   Trigger thumb of left hand 08/16/2018   Urticaria, chronic 05/22/2011   VITAMIN D  DEFICIENCY 03/03/2010   Work-related condition 05/17/2018    Past Surgical History:  Procedure Laterality Date   APPENDECTOMY     bladder tack  1998   Dr Inga Manges (urology)   BRONCHIAL BIOPSY  03/24/2024   Procedure: BRONCHOSCOPY, WITH BIOPSY;  Surgeon: [name], MD;  Location: [facility] ENDOSCOPY;  Service: Pulmonary;;   BRONCHIAL BRUSHINGS  03/24/2024   Procedure: BRONCHOSCOPY, WITH BRUSH BIOPSY;  Surgeon: [name], MD;  Location: [facility] ENDOSCOPY;  Service: Pulmonary;;   BRONCHIAL NEEDLE ASPIRATION BIOPSY  03/24/2024   Procedure: BRONCHOSCOPY, WITH NEEDLE ASPIRATION BIOPSY;  Surgeon: [name], MD;  Location: [facility] ENDOSCOPY;  Service: Pulmonary;;   BRONCHOSCOPY, WITH BIOPSY USING ELECTROMAGNETIC NAVIGATION Bilateral 03/24/2024   Procedure: BRONCHOSCOPY, WITH BIOPSY USING ELECTROMAGNETIC NAVIGATION;  Surgeon: [name], MD;  Location: [facility] ENDOSCOPY;  Service: Pulmonary;  Laterality: Bilateral;  with Fluoro   BUNIONECTOMY  1992   bilateral feet with pins in great toes   CARDIAC CATHETERIZATION     CARPAL TUNNEL RELEASE  2010   Bilateral, Dr Porfirio Bristol Sypher    COLONOSCOPY  2004   Dr Helen Loa (GI)   CYSTOCELE REPAIR  1998    Dr Alix Aquas (GYN)-   ESOPHAGOGASTRODUODENOSCOPY     EVALUATION UNDER ANESTHESIA WITH HEMORRHOIDECTOMY N/A 03/28/2018   Procedure: ANORECTAL EXAM UNDER ANESTHESIA lateral internal partial sphincterotomy, hemorrhoidal ligation pixie, HEMORRHOIDECTOMY;  Surgeon: Candyce Champagne, MD;  Location: [facility] ORS;  Service: General;  Laterality: N/A;   EXAM ANORECTAL W/ ULTRASOUND     Dr. Hershell Lose 03-28-18    EXPLORATORY LAPAROTOMY     FACIAL LACERATIONS REPAIR  Facial Laceration repair as adolescent    FUDUCIAL PLACEMENT  03/24/2024   Procedure: INSERTION, FIDUCIAL MARKER, GOLD;  Surgeon: [name], MD;  Location: [facility] ENDOSCOPY;  Service: Pulmonary;;   INJECTION HIP INTRA ARTICULAR  2008   Left Hip fluoroscopically-guided corticosteorid injection for painful osteoarthritis with good effect (06/2007)   LAPAROSCOPIC LYSIS OF ADHESIONS  05/19/2021   Procedure: LAPAROSCOPIC LYSIS OF ADHESIONS; BIOPSY ABDOMINAL SKIN LESION;  Surgeon: Suzi Essex, MD;  Location: [facility] ORS;  Service: Gynecology;;   LAPAROSCOPY FOR ECTOPIC PREGNANCY     LUMBAR LAMINECTOMY/DECOMPRESSION MICRODISCECTOMY N/A 06/21/2016   Procedure: MICRO LUMBAR DECOMPRESSION L4-L5 AND REMOVAL OF SYNOVIAL CYST    1 LEVEL;  Surgeon: Orvan Blanch, MD;  Location: [facility] ORS;  Service: Orthopedics;  Laterality: N/A;   MRSA     NM MYOVIEW  LTD  2011   Dr Kay Parson, III (Card)   RECTOCELE REPAIR  1998   Dr Alix Aquas (GYN)   ROBOTIC ASSISTED SALPINGO OOPHERECTOMY N/A 05/19/2021   Procedure: XI ROBOTIC ASSISTED LEFT SALPINGO- OOPHORECTOMY WITH PELVIC WASHINGS;  Surgeon: Suzi Essex, MD;  Location: [facility] OR[facility];  Service: Gynecology;  Laterality: N/A;   SPHINCTEROTOMY N/A 03/28/2018   Procedure: ANAL SPHINCTEROTOMY;  Surgeon: Candyce Champagne, MD;  Location: [facility] ORS;  Service: General;  Laterality: N/A;   UPPER GASTROINTESTINAL ENDOSCOPY      Family History  Problem Relation Age of Onset    Hypertension Mother    Frontotemporal dementia Mother    Diabetes Father    Heart disease Father    Hypertension Sister    Obesity Sister    Kidney disease Brother    Heart disease Brother    Hypertension Brother    Hypertension Daughter    Bipolar disorder Daughter    Hypertension Daughter    Breast cancer Daughter 68   Cancer Daughter 97       vulvar cancer   Other Daughter        BRIP1 gene mutation c.2010dup   Thyroid  disease Daughter    Hypertension Son    Hypertension Son    Colon polyps Son 49   Diabetes Maternal Aunt    Lung cancer Paternal Aunt        d. >50   Cancer Paternal Aunt        unknown type, dx >50   Heart Problems Paternal Grandmother    Diabetes Paternal Grandfather    Cancer Cousin 17       gynecologic cancer NOS (paternal first cousin)   Cancer Niece 32       gynecologic cancer NOS    Cancer Paternal Great-grandmother        abdominal cancer NOS   Diabetes type II Other    Hypertension Other    Heart attack Other    Alcohol abuse Other    Depression Other    Asthma Other    Migraines Other    Breast cancer Other        paternal great-great aunt (PGF's aunt)   Esophageal cancer Neg Hx    Stomach cancer Neg Hx    Rectal cancer Neg Hx    Colon cancer Neg Hx    Prostate cancer Neg Hx    Pancreatic cancer Neg Hx     Social History Social History   Tobacco Use   Smoking status: Former    Current packs/day: 0.00    Average packs/day: 1.5 packs/day for 44.0 years (66.0 ttl pk-yrs)    Types: Cigarettes  Start date: 04/06/1964    Quit date: 04/06/2008    Years since quitting: 16.0    Passive exposure: Past   Smokeless tobacco: Never  Vaping Use   Vaping status: Never Used  Substance Use Topics   Alcohol use: No   Drug use: Not Currently    Types: Marijuana    Allergies  Allergen Reactions   Diclofenac-Misoprostol Shortness Of Breath    elevated blood pressure   Aripiprazole     Doesn't want to take because of possible side  effects   Emsam [Selegiline] Other (See Comments)    Burned the skin after using for a year   Estradiol Swelling   Ibuprofen Hives   Naproxen Nausea Only   Neurontin [Gabapentin] Hives and Swelling   Paroxetine Other (See Comments)    Panic attacks   Prednisone Other (See Comments)    Severe depression   Tape Other (See Comments)    Paper tape caused blisters, plastic tape ok    Current Outpatient Medications  Medication Sig Dispense Refill   atorvastatin  (LIPITOR) 20 MG tablet TAKE 1 TABLET BY MOUTH EVERY DAY 90 tablet 3   azithromycin  (ZITHROMAX ) 250 MG tablet Take 2 tablets on day 1, and then 1 tablet for the next 4 days until gone. 6 tablet 0   buPROPion (ZYBAN) 150 MG 12 hr tablet Take 150 mg by mouth 2 (two) times daily.     Calcium  Carb-Cholecalciferol (CALCIUM -VITAMIN D ) 600-400 MG-UNIT TABS Take 1 tablet by mouth daily.     citalopram  (CELEXA ) 20 MG tablet Take 20 mg by mouth at bedtime.     Continuous Glucose Sensor (FREESTYLE LIBRE 14 DAY SENSOR) MISC APPLY EVERY 14 DAYS 2 each 99   dicyclomine  (BENTYL ) 20 MG tablet Take 1 tablet (20 mg total) by mouth every 8 (eight) hours as needed for spasms 90 tablet 1   fexofenadine  (ALLEGRA ) 180 MG tablet Take 180 mg by mouth daily.     fluticasone (FLONASE) 50 MCG/ACT nasal spray Place 2 sprays into both nostrils daily.     hydrochlorothiazide  (HYDRODIURIL ) 25 MG tablet TAKE 1 TABLET BY MOUTH EVERY DAY 90 tablet 3   HYDROcodone -acetaminophen  (NORCO) 10-325 MG tablet Take 1 tablet by mouth every 6 (six) hours as needed for moderate pain (pain score 4-6). 52 tablet 0   KLOR-CON  M10 10 MEQ tablet TAKE 4 TABLETS (40 MEQ TOTAL) BY MOUTH AT BEDTIME. 360 tablet 3   levothyroxine  (SYNTHROID ) 88 MCG tablet TAKE 1 TABLET (88 MCG TOTAL) BY MOUTH EVERY MORNING. 30 MINUTES BEFORE FOOD 90 tablet 1   LORazepam  (ATIVAN ) 0.5 MG tablet Take 0.5 mg by mouth 3 (three) times daily as needed.     metoprolol  tartrate (LOPRESSOR ) 50 MG tablet TAKE 1 TABLET  BY MOUTH TWICE A DAY 180 tablet 3   morphine  (MS CONTIN ) 15 MG 12 hr tablet Take 1 tablet (15 mg total) by mouth every 12 (twelve) hours. 60 tablet 0   Multiple Vitamins-Minerals (MULTIVITAMIN WITH MINERALS) tablet Take 1 tablet by mouth daily.     omeprazole  (PRILOSEC) 40 MG capsule TAKE 1 CAPSULE BY MOUTH EVERY DAY 90 capsule 3   polyethylene glycol powder (GLYCOLAX /MIRALAX ) 17 GM/SCOOP powder Take 17 g by mouth as needed for mild constipation.     prazosin  (MINIPRESS ) 2 MG capsule Take 2 mg by mouth at bedtime.     Probiotic Product (PROBIOTIC DAILY PO) Take 1 capsule by mouth daily.     promethazine  (PHENERGAN ) 25 MG tablet Take 0.5 tablets (  12.5 mg total) by mouth every 8 (eight) hours as needed for nausea or vomiting. 30 tablet 0   ramipril  (ALTACE ) 10 MG capsule TAKE 1 CAPSULE BY MOUTH EVERY DAY 90 capsule 3   senna-docusate (SENOKOT-S) 8.6-50 MG tablet Take 2 tablets by mouth at bedtime. For AFTER surgery, do not take if having diarrhea 30 tablet 0   solifenacin  (VESICARE ) 5 MG tablet TAKE 1 TABLET (5 MG TOTAL) BY MOUTH DAILY. 90 tablet 3   tiZANidine  (ZANAFLEX ) 2 MG tablet Take 2 mg by mouth once.     traZODone  (DESYREL ) 100 MG tablet Take 200 mg by mouth at bedtime.     vitamin B-12 (CYANOCOBALAMIN ) 1000 MCG tablet Take 1,000 mcg by mouth daily.     No current facility-administered medications for this visit.    Review of Systems  Constitutional: positive for fatigue Eyes: negative Ears, nose, mouth, throat, and face: negative Respiratory: positive for dyspnea on exertion Cardiovascular: negative Gastrointestinal: negative Genitourinary:negative Integument/breast: negative Hematologic/lymphatic: negative Musculoskeletal:positive for arthralgias and muscle weakness Neurological: negative Behavioral/Psych: negative Endocrine: negative Allergic/Immunologic: negative  Physical Exam  ZOX:WRUEA, healthy, no distress, well nourished, and well developed SKIN: skin color,  texture, turgor are normal, no rashes or significant lesions HEAD: Normocephalic, No masses, lesions, tenderness or abnormalities EYES: normal, PERRLA, Conjunctiva are pink and non-injected EARS: External ears normal, Canals clear OROPHARYNX:no exudate and no erythema  NECK: supple, no adenopathy, no JVD LYMPH:  no palpable lymphadenopathy, no hepatosplenomegaly BREAST:not examined LUNGS: clear to auscultation , and palpation HEART: regular rate & rhythm, no murmurs, and no gallops ABDOMEN:abdomen soft, non-tender, obese, normal bowel sounds, and no masses or organomegaly BACK: Back symmetric, no curvature., No CVA tenderness EXTREMITIES:no joint deformities, effusion, or inflammation, no edema  NEURO: alert & oriented x 3 with fluent speech, no focal motor/sensory deficits  PERFORMANCE STATUS: ECOG 1  LABORATORY DATA: Lab Results  Component Value Date   WBC 7.7 04/10/2024   HGB 12.7 04/10/2024   HCT 37.3 04/10/2024   MCV 90.1 04/10/2024   PLT 237 04/10/2024      Chemistry      Component Value Date/Time   NA 132 (L) 04/10/2024 1314   NA 136 03/01/2023 1736   K 4.0 04/10/2024 1314   CL 97 (L) 04/10/2024 1314   CO2 30 04/10/2024 1314   BUN 13 04/10/2024 1314   BUN 10 03/01/2023 1736   CREATININE 0.74 04/10/2024 1314   CREATININE 0.78 06/17/2015 1208      Component Value Date/Time   CALCIUM  9.2 04/10/2024 1314   ALKPHOS 50 04/10/2024 1314   AST 20 04/10/2024 1314   ALT 19 04/10/2024 1314   BILITOT 0.4 04/10/2024 1314       RADIOGRAPHIC STUDIES: MR BRAIN W WO CONTRAST Result Date: 04/10/2024 EXAM: MRI BRAIN WITH AND WITHOUT CONTRAST 04/08/2024 02:48:15 PM TECHNIQUE: Multiplanar multisequence MRI of the head/brain was performed with and without the administration of intravenous contrast. COMPARISON: None available. CLINICAL HISTORY: Non-small cell lung cancer (NSCLC), staging. FINDINGS: BRAIN AND VENTRICLES: No acute infarct. No acute intracranial hemorrhage. No mass  effect or midline shift. No hydrocephalus. The sella is unremarkable. Normal flow voids. No mass or abnormal enhancement. Mild atrophy and white matter changes are within normal limits for age. ORBITS: Bilateral lens replacements are noted. The globes and orbits are otherwise within normal limits. SINUSES: No acute abnormality. BONES AND SOFT TISSUES: Normal bone marrow signal and enhancement. No acute soft tissue abnormality. IMPRESSION: 1. No acute intracranial abnormality. 2. No  mass or abnormal enhancement to suggest metastatic disease of the brain. Electronically signed by: Audree Leas MD 04/10/2024 07:40 AM EDT RP Workstation: [id number]   DG Chest Port 1 View Result Date: 03/24/2024 CLINICAL DATA:  Status post bronchoscopy with biopsy of left lower lobe pulmonary nodule EXAM: PORTABLE CHEST 1 VIEW COMPARISON:  CT chest dated 01/21/2024, chest radiograph dated 07/28/2014 FINDINGS: Patient is rotated slightly to the right. Normal lung volumes. Fiducial within the medial left lower lung. Bibasilar hazy opacities. Left greater than right patchy opacities. Trace blunting of bilateral costophrenic angles. No pneumothorax. Enlarged cardiomediastinal silhouette is likely projectional. No acute osseous abnormality. IMPRESSION: 1. No pneumothorax. 2. Trace blunting of bilateral costophrenic angles, which may represent pleural effusions. 3. Left greater than right patchy opacities, likely atelectasis. Electronically Signed   By: Limin  Xu M.D.   On: 03/24/2024 10:54   DG C-ARM BRONCHOSCOPY Result Date: 03/24/2024 C-ARM BRONCHOSCOPY: Fluoroscopy was utilized by the requesting physician.  No radiographic interpretation.    ASSESSMENT: This is a very pleasant 73 years old white female recently diagnosed with stage Ia (T1b, N0, M0) non-small cell lung cancer, adenocarcinoma presented with left lower lobe lung nodule diagnosed in May 2025.   PLAN: I had a lengthy discussion with the patient and her  daughter [name] today about her current disease stage, prognosis and treatment options. I personally independently reviewed the previous imaging studies and pathology report and discussed the result with the patient today. Assessment and Plan    Stage 1A (T1b, N0, M0) NSCLC,  adenocarcinoma of the lung Stage 1A adenocarcinoma located in the left lower lobe. The cancer is in an early stage, with a small nodule confirmed as adenocarcinoma via biopsy. Surgical intervention is contraindicated due to her overall medical condition. Radiation therapy is recommended to target and eliminate the tumor, with a high cure rate of 80% survival at five years post-treatment. Potential post-radiation scarring may appear as enlargement on future scans. - Initiate radiation therapy to the left lower lobe nodule. - Arrange follow-up in four months post-treatment with a chest scan to assess response. - Educate on potential scarring from radiation that may appear on scans. - No chemotherapy, targeted therapy, or immunotherapy required at this stage.  Chronic obstructive pulmonary disease (COPD) COPD with symptoms of dyspnea and cough, consistent with her smoking history, which ceased in 2009. History of emphysema. Previous treatment with azithromycin  for suspected bronchitis.  Fibromyalgia Fibromyalgia contributing to chronic pain, particularly in the lower back and sciatica, managed with analgesics.  Chronic pain Chronic pain primarily in the lower back, associated with sciatica and fibromyalgia, managed with analgesics.  Bipolar disorder Bipolar disorder with ongoing psychiatric care for 51 years. Recent improvement in depressive symptoms reported.  Hypertension Hypertension is part of her medical history.  Hyperlipidemia Hyperlipidemia is part of her medical history.  Prediabetes Prediabetes with previous diabetes diagnosis in 2029. Monitored using a [employer] sensor. Last HbA1c recorded at 6.1% in  January.  Gastroesophageal reflux disease (GERD) GERD is part of her medical history.  Follow-up Follow-up planned to monitor response to radiation therapy and ensure early detection of changes. - Schedule follow-up appointment in four months post-radiation therapy with a chest scan. - Continue regular monitoring with scans every six months for two years, then annually.   The patient was advised to call immediately if she has any other concerning symptoms in the interval. The patient voices understanding of current disease status and treatment options and is in agreement with the current  care plan.  All questions were answered. The patient knows to call the clinic with any problems, questions or concerns. We can certainly see the patient much sooner if necessary.  Thank you so much for allowing me to participate in the care of [name]. I will continue to follow up the patient with you and assist in her care.  The total time spent in the appointment was 60 minutes including review of chart and various tests results, discussions about plan of care and coordination of care plan .   Disclaimer: This note was dictated with voice recognition software. Similar sounding words can inadvertently be transcribed and may not be corrected upon review.   Aurelio Blower April 10, 2024, 2:10 PM

## 2024-04-16 ENCOUNTER — Encounter: Payer: Self-pay | Admitting: Family Medicine

## 2024-04-16 DIAGNOSIS — M48061 Spinal stenosis, lumbar region without neurogenic claudication: Secondary | ICD-10-CM

## 2024-04-16 DIAGNOSIS — G2581 Restless legs syndrome: Secondary | ICD-10-CM

## 2024-04-16 DIAGNOSIS — M160 Bilateral primary osteoarthritis of hip: Secondary | ICD-10-CM

## 2024-04-16 DIAGNOSIS — M47896 Other spondylosis, lumbar region: Secondary | ICD-10-CM

## 2024-04-16 DIAGNOSIS — G894 Chronic pain syndrome: Secondary | ICD-10-CM

## 2024-04-16 MED ORDER — MORPHINE SULFATE ER 15 MG PO TBCR: 15.0000 mg | EXTENDED_RELEASE_TABLET | Freq: Two times a day (BID) | ORAL | 0 refills | Status: DC

## 2024-04-16 MED ORDER — HYDROCODONE-ACETAMINOPHEN 10-325 MG PO TABS: 1.0000 | ORAL_TABLET | Freq: Four times a day (QID) | ORAL | 0 refills | Status: DC | PRN

## 2024-04-18 ENCOUNTER — Ambulatory Visit
Admission: RE | Admit: 2024-04-18 | Discharge: 2024-04-18 | Disposition: A | Discharge status: Home / Self Care | Source: Ambulatory Visit | Attending: Radiation Oncology | Admitting: Radiation Oncology

## 2024-04-18 DIAGNOSIS — C3432 Malignant neoplasm of lower lobe, left bronchus or lung: Secondary | ICD-10-CM | POA: Insufficient documentation

## 2024-04-18 DIAGNOSIS — Z510 Encounter for antineoplastic radiation therapy: Secondary | ICD-10-CM | POA: Insufficient documentation

## 2024-04-18 DIAGNOSIS — C3430 Malignant neoplasm of lower lobe, unspecified bronchus or lung: Secondary | ICD-10-CM

## 2024-04-18 DIAGNOSIS — Z87891 Personal history of nicotine dependence: Secondary | ICD-10-CM | POA: Insufficient documentation

## 2024-04-18 NOTE — Progress Notes (Signed)
  Radiation Oncology         [phone]) [phone] ________________________________  Name: Onya Eutsler MRN: [id number]  Date: 04/18/2024  DOB: April 14, 1951  STEREOTACTIC BODY RADIOTHERAPY SIMULATION AND TREATMENT PLANNING NOTE    ICD-10-CM   1. Non-small cell cancer of lower lobe of lung (HCC)  C34.30       DIAGNOSIS:   73 yo woman with a newly diagnosed stage IA2, NSCLC, adenocarcinoma of the LLL lung   NARRATIVE:  The patient was brought to the CT Simulation planning suite.  Identity was confirmed.  All relevant records and images related to the planned course of therapy were reviewed.  The patient freely provided informed written consent to proceed with treatment after reviewing the details related to the planned course of therapy. The consent form was witnessed and verified by the simulation staff.  Then, the patient was set-up in a stable reproducible  supine position for radiation therapy.  A BodyFix immobilization pillow was fabricated for reproducible positioning.  Then I personally applied the abdominal compression paddle to limit respiratory excursion.  4D respiratoy motion management CT images were obtained.  Surface markings were placed.  The CT images were loaded into the planning software.  Then, using Cine, MIP, and standard views, the internal target volume (ITV) and planning target volumes (PTV) were delinieated, and avoidance structures were contoured.  Treatment planning then occurred.  The radiation prescription was entered and confirmed.  A total of two complex treatment devices were fabricated in the form of the BodyFix immobilization pillow and a neck accuform cushion.  I have requested : 3D Simulation  I have requested a DVH of the following structures: Heart, Lungs, Esophagus, Chest Wall, Brachial Plexus, Major Blood Vessels, and targets.  SPECIAL TREATMENT PROCEDURE:  The planned course of therapy using radiation constitutes a special treatment procedure. Special care is required  in the management of this patient for the following reasons. This treatment constitutes a Special Treatment Procedure for the following reason: [ High dose per fraction requiring special monitoring for increased toxicities of treatment including daily imaging..  The special nature of the planned course of radiotherapy will require increased physician supervision and oversight to ensure patient's safety with optimal treatment outcomes.  This requires extended time and effort.    RESPIRATORY MOTION MANAGEMENT SIMULATION:  In order to account for effect of respiratory motion on target structures and other organs in the planning and delivery of radiotherapy, this patient underwent respiratory motion management simulation.  To accomplish this, when the patient was brought to the CT simulation planning suite, 4D respiratoy motion management CT images were obtained.  The CT images were loaded into the planning software.  Then, using a variety of tools including Cine, MIP, and standard views, the target volume and planning target volumes (PTV) were delineated.  Avoidance structures were contoured.  Treatment planning then occurred.  Dose volume histograms were generated and reviewed for each of the requested structure.  The resulting plan was carefully reviewed and approved today.  PLAN:  The target in the LLL lung will receive 54 Gy in 3 fractions of 18 Gy each.  ________________________________  Trilby Fujisawa. Lorri Rota, M.D.

## 2024-04-24 DIAGNOSIS — C3432 Malignant neoplasm of lower lobe, left bronchus or lung: Secondary | ICD-10-CM | POA: Diagnosis not present

## 2024-04-24 DIAGNOSIS — Z87891 Personal history of nicotine dependence: Secondary | ICD-10-CM | POA: Diagnosis not present

## 2024-04-24 DIAGNOSIS — Z510 Encounter for antineoplastic radiation therapy: Secondary | ICD-10-CM | POA: Diagnosis not present

## 2024-04-30 ENCOUNTER — Other Ambulatory Visit: Payer: Self-pay

## 2024-04-30 ENCOUNTER — Ambulatory Visit
Admission: RE | Admit: 2024-04-30 | Discharge: 2024-04-30 | Disposition: A | Discharge status: Home / Self Care | Source: Ambulatory Visit | Attending: Radiation Oncology

## 2024-04-30 DIAGNOSIS — Z510 Encounter for antineoplastic radiation therapy: Secondary | ICD-10-CM | POA: Diagnosis not present

## 2024-04-30 LAB — RAD ONC ARIA SESSION SUMMARY
Course Elapsed Days: 0
Plan Fractions Treated to Date: 1
Plan Prescribed Dose Per Fraction: 18 Gy
Plan Total Fractions Prescribed: 3
Plan Total Prescribed Dose: 54 Gy
Reference Point Dosage Given to Date: 18 Gy
Reference Point Session Dosage Given: 18 Gy
Session Number: 1

## 2024-05-01 ENCOUNTER — Ambulatory Visit

## 2024-05-02 ENCOUNTER — Ambulatory Visit
Admission: RE | Admit: 2024-05-02 | Discharge: 2024-05-02 | Disposition: A | Discharge status: Home / Self Care | Source: Ambulatory Visit | Attending: Radiation Oncology

## 2024-05-02 ENCOUNTER — Other Ambulatory Visit: Payer: Self-pay

## 2024-05-02 DIAGNOSIS — Z510 Encounter for antineoplastic radiation therapy: Secondary | ICD-10-CM | POA: Diagnosis not present

## 2024-05-02 LAB — RAD ONC ARIA SESSION SUMMARY
Course Elapsed Days: 2
Plan Fractions Treated to Date: 2
Plan Prescribed Dose Per Fraction: 18 Gy
Plan Total Fractions Prescribed: 3
Plan Total Prescribed Dose: 54 Gy
Reference Point Dosage Given to Date: 36 Gy
Reference Point Session Dosage Given: 18 Gy
Session Number: 2

## 2024-05-05 ENCOUNTER — Ambulatory Visit: Admitting: Skilled Nursing Facility1

## 2024-05-05 ENCOUNTER — Ambulatory Visit

## 2024-05-06 ENCOUNTER — Ambulatory Visit

## 2024-05-06 ENCOUNTER — Other Ambulatory Visit: Payer: Self-pay

## 2024-05-06 ENCOUNTER — Ambulatory Visit
Admission: RE | Admit: 2024-05-06 | Discharge: 2024-05-06 | Disposition: A | Discharge status: Home / Self Care | Source: Ambulatory Visit | Attending: Radiation Oncology | Admitting: Radiation Oncology

## 2024-05-06 DIAGNOSIS — Z87891 Personal history of nicotine dependence: Secondary | ICD-10-CM | POA: Insufficient documentation

## 2024-05-06 DIAGNOSIS — Z510 Encounter for antineoplastic radiation therapy: Secondary | ICD-10-CM | POA: Diagnosis not present

## 2024-05-06 DIAGNOSIS — C3432 Malignant neoplasm of lower lobe, left bronchus or lung: Secondary | ICD-10-CM | POA: Insufficient documentation

## 2024-05-06 DIAGNOSIS — C3430 Malignant neoplasm of lower lobe, unspecified bronchus or lung: Secondary | ICD-10-CM

## 2024-05-06 LAB — RAD ONC ARIA SESSION SUMMARY
Course Elapsed Days: 6
Plan Fractions Treated to Date: 3
Plan Prescribed Dose Per Fraction: 18 Gy
Plan Total Fractions Prescribed: 3
Plan Total Prescribed Dose: 54 Gy
Reference Point Dosage Given to Date: 54 Gy
Reference Point Session Dosage Given: 18 Gy
Session Number: 3

## 2024-05-07 NOTE — Radiation Completion Notes (Addendum)
  Radiation Oncology         [phone] ________________________________  Name: [name] MRN: [id number]  Date: 05/06/2024  DOB: [birth year]  Referring Physician: KRYSTAL BALE, M.D. Date of Service: 2024-05-07 Radiation Oncologist: Adina Barge, M.D. [facility] [place]     RADIATION ONCOLOGY END OF TREATMENT NOTE     Diagnosis:  73 y/o woman with a newly diagnosed stage IA2, NSCLC, adenocarcinoma of the LLL lung   Intent: Curative     ==========DELIVERED PLANS==========  First Treatment Date: 2024-04-30 Last Treatment Date: 2024-05-06   Plan Name: Lung_L_SBRT Site: Lung, Left Technique: SBRT/SRT-IMRT Mode: Photon Dose Per Fraction: 18 Gy Prescribed Dose (Delivered / Prescribed): 54 Gy / 54 Gy Prescribed Fxs (Delivered / Prescribed): 3 / 3     ==========ON TREATMENT VISIT DATES========== 2024-04-30, 2024-05-02, 2024-05-06, 2024-05-06   See weekly On Treatment Notes in Epic for details in the Media tab (listed as Progress notes on the On Treatment Visit Dates listed above).  She tolerated the radiation treatments relatively well with only modest fatigue.   The patient will receive a call in about one month from the radiation oncology department. She will continue follow up with her medical oncologist, Dr. Sherrod, as well.  ------------------------------------------------   [name], MD [facility]  Radiation Oncology Direct Dial: [phone]  Fax: [phone] [email or web address]  Skype  LinkedIn

## 2024-05-08 ENCOUNTER — Ambulatory Visit

## 2024-05-12 ENCOUNTER — Ambulatory Visit

## 2024-05-13 ENCOUNTER — Ambulatory Visit

## 2024-05-15 ENCOUNTER — Encounter: Payer: Self-pay | Admitting: Family Medicine

## 2024-05-15 DIAGNOSIS — G2581 Restless legs syndrome: Secondary | ICD-10-CM

## 2024-05-15 DIAGNOSIS — M47896 Other spondylosis, lumbar region: Secondary | ICD-10-CM

## 2024-05-15 DIAGNOSIS — M48061 Spinal stenosis, lumbar region without neurogenic claudication: Secondary | ICD-10-CM

## 2024-05-15 DIAGNOSIS — M160 Bilateral primary osteoarthritis of hip: Secondary | ICD-10-CM

## 2024-05-15 DIAGNOSIS — G894 Chronic pain syndrome: Secondary | ICD-10-CM

## 2024-05-16 ENCOUNTER — Other Ambulatory Visit (HOSPITAL_COMMUNITY): Payer: Self-pay

## 2024-05-16 MED ORDER — MORPHINE SULFATE ER 15 MG PO TBCR
15.0000 mg | EXTENDED_RELEASE_TABLET | Freq: Two times a day (BID) | ORAL | 0 refills | Status: DC
  Filled 2024-05-16: qty 60, 30d supply, fill #0

## 2024-05-16 MED ORDER — HYDROCODONE-ACETAMINOPHEN 10-325 MG PO TABS
1.0000 | ORAL_TABLET | Freq: Four times a day (QID) | ORAL | 0 refills | Status: DC | PRN
  Filled 2024-05-16: qty 52, 13d supply, fill #0

## 2024-05-16 NOTE — Telephone Encounter (Signed)
 Patient returns call to nurse line regarding prescription refills.   She states that she will be totally out of medications after tomorrow.   She is wanting to pick up refills prior to weekend.   Forwarding to PCP.   Chiquita JAYSON English, RN

## 2024-05-19 ENCOUNTER — Encounter: Payer: Self-pay | Admitting: Family Medicine

## 2024-05-19 DIAGNOSIS — E119 Type 2 diabetes mellitus without complications: Secondary | ICD-10-CM

## 2024-05-19 MED ORDER — FREESTYLE LIBRE 14 DAY SENSOR MISC: 1.0000 | 99 refills | Status: DC

## 2024-05-19 MED ORDER — FREESTYLE LIBRE 14 DAY SENSOR MISC
1.0000 | 99 refills | Status: AC
  Filled 2024-05-19: qty 2, 28d supply, fill #0

## 2024-05-20 ENCOUNTER — Other Ambulatory Visit (HOSPITAL_COMMUNITY): Payer: Self-pay

## 2024-05-22 ENCOUNTER — Ambulatory Visit: Admitting: Family Medicine

## 2024-05-22 ENCOUNTER — Ambulatory Visit

## 2024-05-22 ENCOUNTER — Encounter: Payer: Self-pay | Admitting: Family Medicine

## 2024-05-22 NOTE — Patient Instructions (Incomplete)
    Tetanus Boost  It is time for your Tetanus booster.  The vaccination also contains a booster to prevent aults from giving whooping cough (pertussis) to babies.  If you think you will be around babies less than 6 months old in the next 10 years, I would recommend you getting the vaccination.  The vaccination is covered 100% by Medicare.  Please contact your pharmacy to have your tetanus booster vaccination.  You do not need a doctor's prescription for this vaccination.

## 2024-05-24 ENCOUNTER — Other Ambulatory Visit: Payer: Self-pay | Admitting: Family Medicine

## 2024-05-24 DIAGNOSIS — K580 Irritable bowel syndrome with diarrhea: Secondary | ICD-10-CM

## 2024-05-24 DIAGNOSIS — K529 Noninfective gastroenteritis and colitis, unspecified: Secondary | ICD-10-CM

## 2024-05-26 ENCOUNTER — Other Ambulatory Visit (HOSPITAL_COMMUNITY): Payer: Self-pay

## 2024-05-26 MED ORDER — DICYCLOMINE HCL 20 MG PO TABS
20.0000 mg | ORAL_TABLET | Freq: Three times a day (TID) | ORAL | 0 refills | Status: DC | PRN
  Filled 2024-05-26 – 2024-08-17 (×3): qty 90, 30d supply, fill #0

## 2024-05-29 ENCOUNTER — Encounter

## 2024-06-02 ENCOUNTER — Ambulatory Visit

## 2024-06-03 NOTE — Progress Notes (Signed)
 Patient reached out to navigator to clarify certain upcoming appointments she has scheduled. Navigator reviewed the post radiation call on 8/5, and her upcoming appt with Dr. Sherrod on 10/9. Pt's restaging CT scan had not yet been scheduled. Navigator offered to schedule the pt's CT and lab appt to take place approx 1 week prior to seeing Dr. Sherrod. Pt appreciated offer. Navigator let pt know she will call her once her schedule has been updated.  Navigator scheduled CT chest on 10/2 at 2pm and requested scheduler move lab appt to 10/2 at 1pm.  Navigator reached out to pt to provide her the details of her upcoming CT and lab appts. Pt denied questions. Pt knows she can reach out to the navigator with any concerns.

## 2024-06-05 ENCOUNTER — Ambulatory Visit
Admission: RE | Admit: 2024-06-05 | Discharge: 2024-06-05 | Disposition: A | Discharge status: Home / Self Care | Source: Ambulatory Visit | Attending: Family Medicine | Admitting: Family Medicine

## 2024-06-05 DIAGNOSIS — Z1231 Encounter for screening mammogram for malignant neoplasm of breast: Secondary | ICD-10-CM | POA: Diagnosis not present

## 2024-06-10 ENCOUNTER — Ambulatory Visit
Admission: RE | Admit: 2024-06-10 | Discharge: 2024-06-10 | Disposition: A | Discharge status: Home / Self Care | Source: Ambulatory Visit | Attending: Internal Medicine | Admitting: Internal Medicine

## 2024-06-10 DIAGNOSIS — C3432 Malignant neoplasm of lower lobe, left bronchus or lung: Secondary | ICD-10-CM | POA: Insufficient documentation

## 2024-06-10 NOTE — Progress Notes (Addendum)
 [name] presents for a post treatment call. She completed radiation treatment for Malignant Neoplasm of the lower lobe of lung on 05/06/2024.   Radiation Oncology         [phone] ________________________________  Name: [name] MRN: [id number]  Date of Service: 06/10/2024  DOB: 1951-04-27  Post Treatment Telephone Note  Diagnosis:  Non Small Cell Lung Cancer of the Lower Lobe of Lung  The patient was available for call today.  The patient did  note fatigue during radiation. The patient did not note skin changes in the field of radiation during therapy.    The patient is scheduled for CT scans every 3-6 months for ongoing surveillance. He/She was encouraged to call if  she has not received a call to schedule CT imaging, or if she develops concerns or questions regarding radiation.     PAIN:  Denies any chest pain or shortness of breath. Has chronic back pain, takes 10 mg hydrocodone  once during the day and once at bedtime Takes 15 morphine  extended release once daily as well  RESPIRATORY:  Productive cough that is grayish and sometimes clear. Has an appointment with her primary care provider this Thursday 06/12/24 . Pt is on room air. Noted ecchymoses - arm(s) bilateral Bruise Easily.  SWALLOWING/DIET: Pt denies dysphagia . Patient has meals delivered to her house.  OTHER: Pt complains of recent weight gain from 239lbs in January to 259 pounds this month.   Encouraged to eat more fruits and vegetables including drinking more water . Patient does want to exercise to lose weight  There were no vitals taken for this visit.   Wt Readings from Last 3 Encounters:  04/10/24 256 lb 4 oz (116.2 kg)  04/10/24 254 lb 1.6 oz (115.3 kg)  04/01/24 253 lb (114.8 kg)

## 2024-06-10 NOTE — Addendum Note (Signed)
 Encounter addended by: Mohamed-Medani, Elverna LABOR, RN on: 06/10/2024 3:01 PM  Actions taken: Pend clinical note

## 2024-06-10 NOTE — Addendum Note (Signed)
 Encounter addended by: Mohamed-Medani, Elverna LABOR, RN on: 06/10/2024 3:34 PM  Actions taken: Delete clinical note, Clinical Note Signed

## 2024-06-12 ENCOUNTER — Encounter: Payer: Self-pay | Admitting: Family Medicine

## 2024-06-12 ENCOUNTER — Ambulatory Visit: Admitting: Family Medicine

## 2024-06-12 VITALS — BP 145/73 | HR 74 | Ht 64.0 in | Wt 260.2 lb

## 2024-06-12 DIAGNOSIS — M25511 Pain in right shoulder: Secondary | ICD-10-CM

## 2024-06-12 DIAGNOSIS — M25512 Pain in left shoulder: Secondary | ICD-10-CM | POA: Diagnosis not present

## 2024-06-12 DIAGNOSIS — E1169 Type 2 diabetes mellitus with other specified complication: Secondary | ICD-10-CM | POA: Diagnosis not present

## 2024-06-12 DIAGNOSIS — E785 Hyperlipidemia, unspecified: Secondary | ICD-10-CM | POA: Diagnosis not present

## 2024-06-12 DIAGNOSIS — G894 Chronic pain syndrome: Secondary | ICD-10-CM

## 2024-06-12 DIAGNOSIS — M545 Low back pain, unspecified: Secondary | ICD-10-CM

## 2024-06-12 DIAGNOSIS — E039 Hypothyroidism, unspecified: Secondary | ICD-10-CM | POA: Diagnosis not present

## 2024-06-12 DIAGNOSIS — M4306 Spondylolysis, lumbar region: Secondary | ICD-10-CM | POA: Diagnosis not present

## 2024-06-12 DIAGNOSIS — E119 Type 2 diabetes mellitus without complications: Secondary | ICD-10-CM

## 2024-06-12 DIAGNOSIS — E1159 Type 2 diabetes mellitus with other circulatory complications: Secondary | ICD-10-CM | POA: Diagnosis not present

## 2024-06-12 DIAGNOSIS — I152 Hypertension secondary to endocrine disorders: Secondary | ICD-10-CM | POA: Diagnosis not present

## 2024-06-12 LAB — POCT GLYCOSYLATED HEMOGLOBIN (HGB A1C): HbA1c, POC (controlled diabetic range): 6.1 % (ref 0.0–7.0)

## 2024-06-12 NOTE — Progress Notes (Unsigned)
 [name] is {Pc accompanied by:5710} Sources of clinical information for visit is/are {Information source:60032}. Nursing assessment for this office visit was reviewed with the patient for accuracy and revision.   Previous Report(s) Reviewed: {Outside review:15817}     06/12/2024    2:44 PM  Depression screen PHQ 2/9  Decreased Interest 2  Down, Depressed, Hopeless 0  PHQ - 2 Score 2  Altered sleeping 1  Tired, decreased energy 3  Change in appetite 2  Feeling bad or failure about yourself  2  Trouble concentrating 3  Moving slowly or fidgety/restless 1  Suicidal thoughts 0  PHQ-9 Score 14  Difficult doing work/chores Extremely dIfficult   Flowsheet Row Office Visit from 06/12/2024 in [facility] [place] Office Visit from 02/28/2024 in [facility] [place] [facility]. [facility] Office Visit from 11/29/2023 in [facility] [place]  Thoughts that you would be better off dead, or of hurting yourself in some way Not at all Not at all Not at all  PHQ-9 Total Score 14 13 14        06/12/2024    2:38 PM 04/01/2024    3:19 PM 03/03/2024    2:04 PM 02/28/2024   10:44 AM 11/29/2023    9:48 AM  Fall Risk   Falls in the past year? 0 0 0 0 0  Number falls in past yr: 0 0  0 0  Injury with Fall? 0 0  0 0  Risk for fall due to :  Impaired balance/gait;Impaired mobility Impaired balance/gait;Impaired mobility         06/12/2024    2:44 PM 06/12/2024    2:38 PM 04/09/2024    3:40 PM  PHQ9 SCORE ONLY  PHQ-9 Total Score 14 14 0    There are no preventive care reminders to display for this patient.  Health Maintenance Due  Topic Date Due   DTaP/Tdap/Td (3 - Td or Tdap) 10/21/2023   Medicare Annual Wellness (AWV)  03/25/2024   COVID-19 Vaccine (10 - 2024-25 season) 04/24/2024   INFLUENZA VACCINE  06/06/2024      History/P.E. limitations: {exam; limitations  ed:60112}  There are no preventive care reminders to display for this patient.  Diabetes Health Maintenance Due  Topic Date Due   HEMOGLOBIN A1C  12/13/2024   OPHTHALMOLOGY EXAM  04/09/2025    Health Maintenance Due  Topic Date Due   DTaP/Tdap/Td (3 - Td or Tdap) 10/21/2023   Medicare Annual Wellness (AWV)  03/25/2024   COVID-19 Vaccine (10 - 2024-25 season) 04/24/2024   INFLUENZA VACCINE  06/06/2024     Chief Complaint  Patient presents with   Medical Management of Chronic Issues    DM     Discussed the use of AI scribe software for clinical note transcription with the patient, who gave verbal consent to proceed.  History of Present Illness      SDOH Screenings   Food Insecurity: Food Insecurity Present (06/08/2024)  Housing: Low Risk  (06/08/2024)  Transportation Needs: No Transportation Needs (06/08/2024)  Utilities: Not At Risk (04/09/2024)  Alcohol Screen: Low Risk  (04/09/2024)  Depression (PHQ2-9): High Risk (06/12/2024)  Financial Resource Strain: High Risk (06/08/2024)  Physical Activity: Inactive (06/08/2024)  Social Connections: Moderately Isolated (06/08/2024)  Stress: Stress Concern Present (06/08/2024)  Tobacco Use: Medium Risk (06/12/2024)   --------------------------------------------------------------------------------------------------------------------------------------------- Visit  Problem List with Assessment and Plan   Assessment and Plan Assessment & Plan      No problem-specific Assessment & Plan notes found for this encounter.

## 2024-06-12 NOTE — Patient Instructions (Signed)
 A referral was sent to [facility] on [address].   We are checking your thyroid .   Dr Keelyn Monjaras will let you know it they are abnormal.  If they are normal, he will send your a message through your MyChart

## 2024-06-13 ENCOUNTER — Encounter: Payer: Self-pay | Admitting: Family Medicine

## 2024-06-13 DIAGNOSIS — M25511 Pain in right shoulder: Secondary | ICD-10-CM | POA: Insufficient documentation

## 2024-06-13 LAB — TSH: TSH: 10.1 u[IU]/mL — ABNORMAL HIGH (ref 0.450–4.500)

## 2024-06-13 MED ORDER — LEVOTHYROXINE SODIUM 100 MCG PO TABS: 100.0000 ug | ORAL_TABLET | ORAL | 3 refills | Status: AC

## 2024-06-13 NOTE — Assessment & Plan Note (Signed)
 Established problem. Adequate blood pressure control.  No evidence of new end organ damage.  Tolerating medication without significant adverse effects.  Plan to continue current blood pressure medication regiment.

## 2024-06-13 NOTE — Addendum Note (Signed)
 Addended byBETHA BALE, Kriston Mckinnie D on: 06/13/2024 12:43 PM   Modules accepted: Orders

## 2024-06-13 NOTE — Assessment & Plan Note (Signed)
 Established problem Stable. Tolerating atorvastatin  20 daily Lab Results  Component Value Date   LDLCALC 55 02/28/2024   Continue atorvastatin  20 mg daily

## 2024-06-13 NOTE — Assessment & Plan Note (Signed)
 Established problem Lab Results  Component Value Date   TSH 10.100 (H) 06/12/2024   Spoke by phone with [name] on 06/13/24.  Gabriella Jou has been taking the levothyroxine  88mcg without missing doses.   We agreed she would increase to 100 microgram daily. Will recheck TSH at next office visit in 3 months.

## 2024-06-13 NOTE — Assessment & Plan Note (Addendum)
 New problem Onset last week.  Uncertain if slept on shoulder. Suspect impingement or rotator cuff origin of pain.  Hurts with raising shoulder.  PMH: complaints of pains in shoulders, one at a time, in past.  Requesting Physical Therapy referral.  She previously could not afford the copay for follow up visits, but she is now able to do so.

## 2024-06-13 NOTE — Assessment & Plan Note (Signed)
Established problem. Adequate glycemic control.  Pt is tolerating the current medication regiment. Continue current treatment plan.

## 2024-06-13 NOTE — Assessment & Plan Note (Signed)
 Established problem that has improved and has meet goal of adeqaute analgesia, ability to perform ADLs and iADLs, no no reported adverse effects. PDMP review without red flags.  Discussed reduction in dosing/schedule, but she wants to hold on this change during SBRT treatment  Continue MS Contin  15 mg twice a day and decrease Norco 5/325 to one tablet at bedtime.  Continue Norco 5/325 #54 per month.

## 2024-06-14 ENCOUNTER — Encounter: Payer: Self-pay | Admitting: Family Medicine

## 2024-06-14 DIAGNOSIS — G2581 Restless legs syndrome: Secondary | ICD-10-CM

## 2024-06-14 DIAGNOSIS — M48061 Spinal stenosis, lumbar region without neurogenic claudication: Secondary | ICD-10-CM

## 2024-06-14 DIAGNOSIS — G894 Chronic pain syndrome: Secondary | ICD-10-CM

## 2024-06-14 DIAGNOSIS — M47896 Other spondylosis, lumbar region: Secondary | ICD-10-CM

## 2024-06-14 DIAGNOSIS — M160 Bilateral primary osteoarthritis of hip: Secondary | ICD-10-CM

## 2024-06-16 MED ORDER — MORPHINE SULFATE ER 15 MG PO TBCR: 15.0000 mg | EXTENDED_RELEASE_TABLET | Freq: Two times a day (BID) | ORAL | 0 refills | Status: DC

## 2024-06-16 MED ORDER — HYDROCODONE-ACETAMINOPHEN 10-325 MG PO TABS: 1.0000 | ORAL_TABLET | Freq: Four times a day (QID) | ORAL | 0 refills | Status: DC | PRN

## 2024-06-18 ENCOUNTER — Other Ambulatory Visit: Payer: Self-pay | Admitting: Family Medicine

## 2024-06-18 DIAGNOSIS — N3946 Mixed incontinence: Secondary | ICD-10-CM

## 2024-06-26 ENCOUNTER — Other Ambulatory Visit: Payer: Self-pay

## 2024-06-26 ENCOUNTER — Ambulatory Visit: Attending: Family Medicine

## 2024-06-26 DIAGNOSIS — M545 Low back pain, unspecified: Secondary | ICD-10-CM | POA: Diagnosis not present

## 2024-06-26 DIAGNOSIS — M5459 Other low back pain: Secondary | ICD-10-CM | POA: Insufficient documentation

## 2024-06-26 DIAGNOSIS — M4306 Spondylolysis, lumbar region: Secondary | ICD-10-CM | POA: Insufficient documentation

## 2024-06-26 DIAGNOSIS — M5441 Lumbago with sciatica, right side: Secondary | ICD-10-CM | POA: Diagnosis not present

## 2024-06-26 DIAGNOSIS — R531 Weakness: Secondary | ICD-10-CM | POA: Diagnosis not present

## 2024-06-26 DIAGNOSIS — G8929 Other chronic pain: Secondary | ICD-10-CM | POA: Diagnosis not present

## 2024-06-26 DIAGNOSIS — M6281 Muscle weakness (generalized): Secondary | ICD-10-CM | POA: Diagnosis not present

## 2024-06-26 DIAGNOSIS — M25512 Pain in left shoulder: Secondary | ICD-10-CM | POA: Insufficient documentation

## 2024-06-26 NOTE — Therapy (Signed)
 OUTPATIENT PHYSICAL THERAPY SHOULDER and THORACOLUMBAR EVALUATION   Patient Name: [name] MRN: [id number] DOB:17-Jul-1951, 73 y.o., female Today's Date: 06/27/2024  END OF SESSION:  PT End of Session - 06/26/24 1428     Visit Number 1    Number of Visits 17    Date for PT Re-Evaluation 08/29/24    Authorization Type HTA    PT Start Time 1417    PT Stop Time 1500    PT Time Calculation (min) 43 min    Activity Tolerance Patient tolerated treatment well;Patient limited by pain    Behavior During Therapy [facility] for tasks assessed/performed          Past Medical History:  Diagnosis Date   Adrenal adenoma, left 12/02/2020   CT AP 11/16/20:  left adrenal mass measuring approximately 2.7 cm. This is minimally increased in size from prior study and is consistent with a benign adrenal adenoma.    Adrenal nodule (HCC) 04/08/2011   04/08/11 Abdominal CT showed Left adrenal nodule which is tachnically indeterminate but most likely an adenoma.  It is 1.7 cm and 42 HU on portal venous phase image. 31 HU on delayed images.   01/09/2012 Abdominal CT w/ & w/o CM: Stable benign left adrenal adenoma. No further specific follow-up is needed for this finding.     Allergic rhinitis 01/03/2007   Qualifier: Diagnosis of  By: Kivett, Whitney     Allergy    Anemia    hx of  in 20's   Anxiety    Bipolar disorder (HCC)    Carpal tunnel syndrome 02/14/2007   S/P carpel tunnel release bilaterally.     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 03/28/2018 03/28/2018   COPD WITHOUT EXACERBATION 01/03/2007   Qualifier: Diagnosis of  By: Damien Folks  emphysema   Degenerative joint disease of both hips 05/23/2007   CT AP 11/27/20: Thre are degenerative changes of both hips     Diabetes mellitus without complication (HCC)    A1C 6.5 as of 05-2019- diet changes, no meds currently    DISC WITH  RADICULOPATHY 01/03/2007   Qualifier: Diagnosis of  By: Damien Folks     Emphysema of lung (HCC)    Emphysema of Lung on CT 01/10/2024   Esophageal reflux 01/03/2007   Centricity Description: GASTROESOPHAGEAL REFLUX, NO ESOPHAGITIS Qualifier: Diagnosis of  By: Damien Folks   Centricity Description: GERD Qualifier: Diagnosis of  By: Zackary MD, Weston     External hemorrhoids s/p hemorrhoidectomy 03/28/2018 03/28/2018   Facial pain 09/20/2022   Family history of breast cancer    Family history of cancer of female genital organ    Family history of colon cancer    Family history of gene mutation    BRIP1 gene mutation (c.2010dup) in daughter   Family history of lung cancer    Fibromyalgia    Fracture of bone spur of inferior portion of calcaneus 10/06/2018   Functional gastrointestinal disorder 06/18/2008   Functional Gastrointestinal Disorder with frequent eructation and bloating sensation with acute flares.  Responsive to Compazine  and Bentyl .  Takes a few days to calm down.     GERD without esophagitis 01/03/2007   Centricity Description: GASTROESOPHAGEAL REFLUX, NO ESOPHAGITIS Qualifier: Diagnosis of  By: Damien Folks   Centricity Description: GERD Qualifier: Diagnosis of  By: Zackary MD, Weston     Hearing impairment 04/28/2021  Heel spur, fracture, left 04/17/2018   HEMORRHOIDS, NOS 01/03/2007   Qualifier: Diagnosis of  By: Damien Folks     Hepatic steatosis 04/12/2011   Herpes simplex labialis 05/11/2011   Hip osteoarthritis 05/23/2007   MRI Pelvis (03/13/07) Bilateral osteoarthritis of the hips, left greater than right.  The  patient has a slightly more prominent hip effusion on the left than  the right.  No other significant abnormality.     History of MRSA infection 04/28/2011   HYPERCHOLESTEROLEMIA 12/24/2008   Hypertension    HYPERTENSION, BENIGN SYSTEMIC 01/03/2007   Qualifier: Diagnosis of  By: Damien Folks     HYPERTRIGLYCERIDEMIA 11/11/2007   Qualifier:  Diagnosis of  By: McDiarmid MD, Todd     Hypokalemia 12/12/2017   Hypothyroidism 02/15/2007   Qualifier: Diagnosis of  By: McDiarmid MD, Krystal BAND, BORDERLINE 02/15/2007   Insomnia 03/03/2021   Intra-abdominal adhesions    Irritable bowel syndrome 01/03/2007   Qualifier: Diagnosis of  By: Damien Folks     Knee pain, acute 12/01/2022   LACTOSE INTOLERANCE 03/10/2008   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   Leukoplakia of oral cavity 09/20/2022   Major depressive disorder, recurrent episode (HCC) 01/03/2007   Qualifier: Diagnosis of  By: Kivett, Whitney     Medication management 12/14/2016   Managing topiramate  for weight loss starting 12/14/16   Memory difficulties 03/04/2021   Metabolic syndrome 02/29/2012   Monoallelic mutation of BRIP1 gene 01/17/2021   c.2010dup (p.[id number]*)   NAFL (nonalcoholic fatty liver) 04/12/2011   Mild hepatic Steatosis on CT 04/08/11 for abdominal pain.      No impairment of memory 04/29/2021   Obesity (BMI 30.0-34.9) 09/30/2007   Has lost 55 lbs since easter. Goal of <190 by 08/05/12.      Obesity, Class III, BMI 40-49.9 (morbid obesity) 09/30/2007        Osteoarthritis of spine 01/03/2007   MR I Lumbar (08/03): GeneralDDD; L2-3 Large  HNP.  Mod pos  hnp - 12/14/2005, smhnpw/irritL4root - 12/14/2005     OSTEOARTHRITIS, HANDS, BILATERAL 06/18/2008   Qualifier: Diagnosis of  By: McDiarmid MD, Krystal BEAL FEMORAL STRESS SYNDROME 01/03/2007   Qualifier: Diagnosis of  By: Damien Folks     Periodic limb movements of sleep 12/05/2010   Diagnosed on Polysomnography testing Fall 2011.      Pneumonia    walking pneumonia in the 90's   Pre-diabetes 12/25/2008   Qualifier: Diagnosis of  By: McDiarmid MD, Todd     Prolapsed internal hemorrhoids, grade 3, s/p ligation/pexy/hemorrhoidectomy 03/28/2018 03/28/2018   Pulmonary nodules/lesions, multiple 03/02/2023    Pyriformis syndrome, right 03/02/2023   RESTLESS LEGS SYNDROME 09/11/2007   Polysomnography (10/2010, Dr Quita Salt, interpreter. ) showed periodic limb movements that frequently awoke patient from sleep with arousal index of 4 per hour. Dr Francis Dresser (Sleep Specialist) thought her RLS was the major contributor to patient poor sleep condition, more than any sleep apnea.  He recommended considering opamine agonist (either requip or mirapex) to see if this will help.      Rosacea, acne 10/24/2011   SIGMOID POLYP 01/23/2008   Solitary lung nodule 04/08/2011   Stage 1 mild COPD by GOLD classification (HCC) 01/03/2007   11/28/2017 Chest CT: Mild centrilobular and paraseptal emphysema with mild diffuse bronchial wall thickening     Stress incontinence    Synovial cyst of lumbar facet joint 02/17/2016  Trigger thumb of left hand 08/16/2018   Type 2 diabetes mellitus with obesity (HCC) 06/30/2019   IMO SNOMED Dx Update Oct 2024     Urticaria, chronic 05/22/2011   VITAMIN D  DEFICIENCY 03/03/2010   Work-related condition 05/17/2018   Past Surgical History:  Procedure Laterality Date   APPENDECTOMY     bladder tack  1998   Dr Watt (urology)   BRONCHIAL BIOPSY  03/24/2024   Procedure: BRONCHOSCOPY, WITH BIOPSY;  Surgeon: Shelah Lamar RAMAN, MD;  Location: [facility] ENDOSCOPY;  Service: Pulmonary;;   BRONCHIAL BRUSHINGS  03/24/2024   Procedure: BRONCHOSCOPY, WITH BRUSH BIOPSY;  Surgeon: Shelah Lamar RAMAN, MD;  Location: [facility] ENDOSCOPY;  Service: Pulmonary;;   BRONCHIAL NEEDLE ASPIRATION BIOPSY  03/24/2024   Procedure: BRONCHOSCOPY, WITH NEEDLE ASPIRATION BIOPSY;  Surgeon: Shelah Lamar RAMAN, MD;  Location: [facility] ENDOSCOPY;  Service: Pulmonary;;   BRONCHOSCOPY, WITH BIOPSY USING ELECTROMAGNETIC NAVIGATION Bilateral 03/24/2024   Procedure: BRONCHOSCOPY, WITH BIOPSY USING ELECTROMAGNETIC NAVIGATION;  Surgeon: Shelah Lamar RAMAN, MD;  Location: [facility] ENDOSCOPY;  Service: Pulmonary;  Laterality: Bilateral;  with Fluoro    BUNIONECTOMY  1992   bilateral feet with pins in great toes   CARDIAC CATHETERIZATION     CARPAL TUNNEL RELEASE  2010   Bilateral, Dr Lamar Sypher   COLONOSCOPY  2004   Dr DOROTHA Sous (GI)   CYSTOCELE REPAIR  1998    Dr Evangeline (GYN)-   ESOPHAGOGASTRODUODENOSCOPY     EVALUATION UNDER ANESTHESIA WITH HEMORRHOIDECTOMY N/A 03/28/2018   Procedure: ANORECTAL EXAM UNDER ANESTHESIA lateral internal partial sphincterotomy, hemorrhoidal ligation pixie, HEMORRHOIDECTOMY;  Surgeon: Sheldon Standing, MD;  Location: [facility] ORS;  Service: General;  Laterality: N/A;   EXAM ANORECTAL W/ ULTRASOUND     Dr. Sheldon 03-28-18    EXPLORATORY LAPAROTOMY     FACIAL LACERATIONS REPAIR     Facial Laceration repair as adolescent    FUDUCIAL PLACEMENT  03/24/2024   Procedure: INSERTION, FIDUCIAL MARKER, GOLD;  Surgeon: Shelah Lamar RAMAN, MD;  Location: [facility] ENDOSCOPY;  Service: Pulmonary;;   INJECTION HIP INTRA ARTICULAR  2008   Left Hip fluoroscopically-guided corticosteorid injection for painful osteoarthritis with good effect (06/2007)   LAPAROSCOPIC LYSIS OF ADHESIONS  05/19/2021   Procedure: LAPAROSCOPIC LYSIS OF ADHESIONS; BIOPSY ABDOMINAL SKIN LESION;  Surgeon: Viktoria Comer SAUNDERS, MD;  Location: [facility] ORS;  Service: Gynecology;;   LAPAROSCOPY FOR ECTOPIC PREGNANCY     LUMBAR LAMINECTOMY/DECOMPRESSION MICRODISCECTOMY N/A 06/21/2016   Procedure: MICRO LUMBAR DECOMPRESSION L4-L5 AND REMOVAL OF SYNOVIAL CYST    1 LEVEL;  Surgeon: Reyes Billing, MD;  Location: [facility] ORS;  Service: Orthopedics;  Laterality: N/A;   MRSA     NM MYOVIEW  LTD  2011   Dr Victory Sharps, III (Card)   RECTOCELE REPAIR  1998   Dr Evangeline (GYN)   ROBOTIC ASSISTED SALPINGO OOPHERECTOMY N/A 05/19/2021   Procedure: XI ROBOTIC ASSISTED LEFT SALPINGO- OOPHORECTOMY WITH PELVIC WASHINGS;  Surgeon: Viktoria Comer SAUNDERS, MD;  Location: [facility] ORS;  Service: Gynecology;  Laterality: N/A;   SPHINCTEROTOMY N/A 03/28/2018   Procedure: ANAL SPHINCTEROTOMY;  Surgeon: Sheldon Standing,  MD;  Location: [facility] OR[facility];  Service: General;  Laterality: N/A;   UPPER GASTROINTESTINAL ENDOSCOPY     Patient Active Problem List   Diagnosis Date Noted   Right shoulder pain 06/13/2024   Non-small cell cancer of lower lobe of lung (HCC) 04/09/2024   Malignant adenocarcinoma of unspecified part of unspecified bronchus or lung (HCC) 04/06/2024   Episodic tension type headache 02/29/2024   Folliculitis,  left shin 02/29/2024   Emphysema of Lung on CT 01/10/2024   Pulmonary nodule 03/02/2023   Polypharmacy 04/29/2021   Adrenal adenoma, left 12/02/2020   Arteriosclerosis of abdominal aorta (HCC) 12/02/2020   Urinary incontinence, mixed 04/02/2020   Type 2 diabetes mellitus without complications (HCC) 05/22/2019   Coronary artery calcification seen on CT scan 12/03/2017   Spinal stenosis of lumbar region 06/21/2016   Encounter for chronic pain management 12/03/2014   Knee pain, bilateral 11/25/2013   Metabolic dysfunction-associated steatohepatitis (MASH) 04/12/2011    Class: Chronic   Vitamin D  deficiency 03/03/2010   History of tobacco abuse 11/10/2009   Hyperlipidemia associated with type 2 diabetes mellitus (HCC) 12/24/2008   Chronic pain syndrome 12/24/2008   Functional gastrointestinal disorder 06/18/2008   HYPERTRIGLYCERIDEMIA 11/11/2007   Obesity, Class III, BMI 40-49.9 (morbid obesity) 09/30/2007   RESTLESS LEGS SYNDROME 09/11/2007   Fibromyalgia syndrome 07/15/2007   Hypothyroidism 02/15/2007   Bipolar 2 disorder (HCC) 01/03/2007   Hypertension associated with diabetes (HCC) 01/03/2007   Allergic rhinitis 01/03/2007   GERD without esophagitis 01/03/2007   Irritable bowel syndrome 01/03/2007   Osteoarthritis of spine 01/03/2007    PCP: McDiarmid, Krystal BIRCH, MD   REFERRING PROVIDER: McDiarmid, Krystal BIRCH, MD   REFERRING DIAG:  M25.512 (ICD-10-CM) - Left shoulder pain, unspecified chronicity  M54.50,G89.29 (ICD-10-CM) - Chronic right-sided low back pain without sciatica   M43.06 (ICD-10-CM) - Lumbar spondylolysis    Rationale for Evaluation and Treatment: Rehabilitation  THERAPY DIAG:  Acute pain of left shoulder  Weakness generalized  Chronic bilateral low back pain with right-sided sciatica  ONSET DATE: L shoulder 2 weeks ago; chronic for low back pain SUBJECTIVE:                                                                                                                                                                                           SUBJECTIVE STATEMENT: Pt requested today's eval be directed for her L shoulder. Pt states she was lifting a 20# delivery from the ground which she believes injured her L shoulder. The pain did not start bothering her until the next day. Pt feel the L shoulder is getting better. She states having a chronic Hx of low back pain with surgery in 2017.  L handed  PERTINENT HISTORY:  High BMI, Lumbar laminectomy/decompression microdiscectomy; stage 1 lung CA; COPD, OA both hips, DM  PAIN:  Are you having pain? Yes: NPRS scale: 6/10. Range of the pain the last: 4-6/10 Pain location: L GH area Pain description: Ache, throb Aggravating factors: Sleeping on it, reaching across body and reaching OH Relieving factors: Salonpas patches,  heating pad  Yes: NPRS scale: 0/10 in sitting, 9/10 in standing Pain location: Low back and R LE Pain description: sharp Aggravating factors: standing, walking Relieving factors: Sitting  PRECAUTIONS: None  RED FLAGS: None   WEIGHT BEARING RESTRICTIONS: No  FALLS:  Has patient fallen in last 6 months? No  LIVING ENVIRONMENT: Lives with: lives with their family Lives in: House/apartment Able to access home. Level entry  OCCUPATION: Retired  PLOF: Independent with household mobility without device and Independent with community mobility with device  PATIENT GOALS: Less pain and return to normal use  NEXT MD VISIT: 3 months  OBJECTIVE:  Note: Objective measures  were completed at Evaluation unless otherwise noted.  DIAGNOSTIC FINDINGS:  See Epic, none for the shoulder and lumbar in 2017  PATIENT SURVEYS:  Quick Dash: 64%   Minimally Clinically Important Difference (MCID): 15-20 points  (Franchignoni, F. et al. (2013). Minimally clinically important difference of the disabilities of the arm, shoulder, and hand outcome measures (DASH) and its shortened version (Quick DASH). Journal of Orthopaedic & Sports Physical Therapy, 44(1), 30-39)   COGNITION: Overall cognitive status: Within functional limits for tasks assessed     SENSATION: WFL  MUSCLE LENGTH: Hamstrings: Right TBA deg; Left TBA deg Debby test: Right TBA deg; Left TBA deg  POSTURE: rounded shoulders and forward head  PALPATION: TTP to the posterior/lateral GH area  UPPER EXTREMITY ROM:   Active ROM Right eval Left eval  Shoulder flexion 130 130 pain at end range  Shoulder extension    Shoulder abduction    Shoulder adduction    Shoulder internal rotation T10 L5; pain at end range  Shoulder external rotation T3 Back of head; pain at end range  Elbow flexion    Elbow extension    Wrist flexion    Wrist extension    Wrist ulnar deviation    Wrist radial deviation    Wrist pronation    Wrist supination    (Blank rows = not tested)  UPPER EXTREMITY MMT:  MMT Right eval Left eval  Shoulder flexion 5 4+ p  Shoulder extension    Shoulder abduction 5 4+ p  Shoulder adduction    Shoulder internal rotation 5 4+ p  Shoulder external rotation 5 4+ p  Middle trapezius    Lower trapezius    Elbow flexion    Elbow extension    Wrist flexion    Wrist extension    Wrist ulnar deviation    Wrist radial deviation    Wrist pronation    Wrist supination    Grip strength (lbs)    (Blank rows = not tested)  SHOULDER SPECIAL TESTS: Impingement tests: Hawkins/Kennedy impingement test: negative SLAP lesions: Clunk test: negative Instability tests: NT Rotator cuff  assessment: Empty can test: negative, Full can test: negative, and Hornblower's sign: negativeAll tets reproduced pain, but were not overtly weak  Biceps assessment: Yergason's test: negative and Speed's test: negative  JOINT MOBILITY TESTING:  WNLs  LUMBAR ROM: TBA  AROM eval  Flexion   Extension   Right lateral flexion   Left lateral flexion   Right rotation   Left rotation    (Blank rows = not tested)  LOWER EXTREMITY ROM:  TBA  Active  Right eval Left eval  Hip flexion    Hip extension    Hip abduction    Hip adduction    Hip internal rotation    Hip external rotation    Knee flexion  Knee extension    Ankle dorsiflexion    Ankle plantarflexion    Ankle inversion    Ankle eversion     (Blank rows = not tested)  LOWER EXTREMITY MMT:  TBA  MMT Right eval Left eval  Hip flexion    Hip extension    Hip abduction    Hip adduction    Hip internal rotation    Hip external rotation    Knee flexion    Knee extension    Ankle dorsiflexion    Ankle plantarflexion    Ankle inversion    Ankle eversion     (Blank rows = not tested)  LUMBAR SPECIAL TESTS: TBA  FUNCTIONAL TESTS: 5xSTS- TBA  GAIT: TBA Distance walked:  Assistive device utilized:  Level of assistance:  Comments:   TREATMENT DATE:  [facility] Adult PT Treatment:                                                DATE: 06/26/24 Therapeutic Exercise: Developed, instructed in, and pt completed therex as noted in HEP                                                                                                                    PATIENT EDUCATION:  Education details: Eval findings, POC, HEP Person educated: Patient Education method: Explanation, Demonstration, Tactile cues, Verbal cues, and Handouts Education comprehension: verbalized understanding, returned demonstration, verbal cues required, and tactile cues required  HOME EXERCISE PROGRAM: Access Code: [email or web address] URL:  [email or web address] Date: 06/26/2024 Prepared by: Dasie Daft  Exercises - Shoulder External Rotation and Scapular Retraction with Resistance  - 1 x daily - 7 x weekly - 2 sets - 10 reps - 3 hold - Standing Shoulder Row with Anchored Resistance  - 1 x daily - 7 x weekly - 2 sets - 10 reps - 3 hold - Shoulder Extension with Resistance  - 1 x daily - 7 x weekly - 2 sets - 10 reps - 3 hold  ASSESSMENT:  CLINICAL IMPRESSION: Patient is a 73 y.o. female who was seen today for physical therapy evaluation and treatment for  M25.512 (ICD-10-CM) - Left shoulder pain, unspecified chronicity  M54.50,G89.29 (ICD-10-CM) - Chronic right-sided low back pain without sciatica  M43.06 (ICD-10-CM) - Lumbar spondylolysis    OBJECTIVE IMPAIRMENTS: decreased activity tolerance, decreased ROM, decreased strength, impaired UE functional use, pain, and high BMI.   ACTIVITY LIMITATIONS: carrying, lifting, bending, standing, squatting, sleeping, stairs, bathing, toileting, dressing, reach over head, hygiene/grooming, locomotion level, and caring for others  PARTICIPATION LIMITATIONS: meal prep, cleaning, laundry, shopping, and community activity  PERSONAL FACTORS: High BMI, Lumbar laminectomy/decompression microdiscectomy; stage 1 lung CA; COPD, OA both hips, DM are also affecting patient's functional outcome.   REHAB POTENTIAL: Good  CLINICAL DECISION MAKING: Evolving/moderate complexity  EVALUATION COMPLEXITY: Moderate   GOALS:  SHORT TERM GOALS: Target  date: 07/25/24  Pt will be Ind in an initial HEP  Baseline: Goal status: INITIAL  2.  Pt will report 25% or greater improvement in pain for L shoulder and low back for improved function and QOL Baseline:  Goal status: INITIAL   LONG TERM GOALS: Target date: 08/29/24  Pt will be Ind in a final HEP to maintain achieved LOF  Baseline:  Goal status: INITIAL  2.  Pt will report 75% or greater improvement in pain for L shoulder and  50% for low back for improved function and QOL Baseline:  Goal status: INITIAL  3.  Pt's L shoulder AROM for ER and IR will improve to functional levels approaching levels similar to the R Shoulder Baseline:  Goal status: INITIAL  4.  Pt will be able to lift 2# OH for functional use of the L UE with daily activities Baseline:  Goal status: INITIAL  5.  Pt's Quick Hollis will improved by the MCID to 49% or better as indication of improved function  Baseline: 64% Goal status: INITIAL  PLAN:  PT FREQUENCY: 2x/week  PT DURATION: 8 weeks  PLANNED INTERVENTIONS: 97164- PT Re-evaluation, 97110-Therapeutic exercises, 97530- Therapeutic activity, 97112- Neuromuscular re-education, 97535- Self Care, 02859- Manual therapy, [phone]- Aquatic Therapy, [id number]- Electrical stimulation (unattended), [phone]- Ionotophoresis 4mg /ml Dexamethasone , 79439 (1-2 muscles), [id number] (3+ muscles)- Dry Needling, Patient/Family education, Taping, Joint mobilization, Spinal mobilization, Cryotherapy, and Moist heat.  PLAN FOR NEXT SESSION: Eval low back with the next 3 PT appts; assess response to HEP; progress therex as indicated; use of modalities, manual therapy; and TPDN as indicated.   Keyaira Clapham MS, PT 06/28/24 3:17 PM

## 2024-06-30 ENCOUNTER — Ambulatory Visit: Admitting: Physical Therapy

## 2024-06-30 ENCOUNTER — Encounter: Payer: Self-pay | Admitting: Physical Therapy

## 2024-06-30 DIAGNOSIS — R531 Weakness: Secondary | ICD-10-CM

## 2024-06-30 DIAGNOSIS — M5459 Other low back pain: Secondary | ICD-10-CM

## 2024-06-30 DIAGNOSIS — M25512 Pain in left shoulder: Secondary | ICD-10-CM

## 2024-06-30 DIAGNOSIS — G8929 Other chronic pain: Secondary | ICD-10-CM

## 2024-06-30 NOTE — Therapy (Signed)
 OUTPATIENT PHYSICAL THERAPY DAILY NOTE   Patient Name: [name] MRN: [id number] DOB:[birth year], 73 y.o., female Today's Date: 06/30/2024  END OF SESSION:  PT End of Session - 06/30/24 1054     Visit Number 2    Number of Visits 17    Date for PT Re-Evaluation 08/29/24    Authorization Type HTA    PT Start Time 1100    PT Stop Time 1142    PT Time Calculation (min) 42 min    Activity Tolerance Patient tolerated treatment well;Patient limited by pain    Behavior During Therapy [facility] for tasks assessed/performed           Past Medical History:  Diagnosis Date   Adrenal adenoma, left 12/02/2020   CT AP 11/16/20:  left adrenal mass measuring approximately 2.7 cm. This is minimally increased in size from prior study and is consistent with a benign adrenal adenoma.    Adrenal nodule (HCC) 04/08/2011   04/08/11 Abdominal CT showed Left adrenal nodule which is tachnically indeterminate but most likely an adenoma.  It is 1.7 cm and 42 HU on portal venous phase image. 31 HU on delayed images.   01/09/2012 Abdominal CT w/ & w/o CM: Stable benign left adrenal adenoma. No further specific follow-up is needed for this finding.     Allergic rhinitis 01/03/2007   Qualifier: Diagnosis of  By: Kivett, Whitney     Allergy    Anemia    hx of  in 20's   Anxiety    Bipolar disorder (HCC)    Carpal tunnel syndrome 02/14/2007   S/P carpel tunnel release bilaterally.     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 03/28/2018 03/28/2018   COPD WITHOUT EXACERBATION 01/03/2007   Qualifier: Diagnosis of  By: Damien Folks  emphysema   Degenerative joint disease of both hips 05/23/2007   CT AP 11/27/20: Thre are degenerative changes of both hips     Diabetes mellitus without complication (HCC)    A1C 6.5 as of 05-2019- diet changes, no meds currently    DISC WITH RADICULOPATHY 01/03/2007    Qualifier: Diagnosis of  By: Damien Folks     Emphysema of lung (HCC)    Emphysema of Lung on CT 01/10/2024   Esophageal reflux 01/03/2007   Centricity Description: GASTROESOPHAGEAL REFLUX, NO ESOPHAGITIS Qualifier: Diagnosis of  By: Damien Folks   Centricity Description: GERD Qualifier: Diagnosis of  By: Zackary MD, Weston     External hemorrhoids s/p hemorrhoidectomy 03/28/2018 03/28/2018   Facial pain 09/20/2022   Family history of breast cancer    Family history of cancer of female genital organ    Family history of colon cancer    Family history of gene mutation    BRIP1 gene mutation (c.2010dup) in daughter   Family history of lung cancer    Fibromyalgia    Fracture of bone spur of inferior portion of calcaneus 10/06/2018   Functional gastrointestinal disorder 06/18/2008   Functional Gastrointestinal Disorder with frequent eructation and bloating sensation with acute flares.  Responsive to Compazine  and Bentyl .  Takes a few days to calm down.     GERD without esophagitis 01/03/2007   Centricity Description: GASTROESOPHAGEAL REFLUX, NO ESOPHAGITIS Qualifier: Diagnosis of  By: Damien Folks   Centricity Description: GERD Qualifier: Diagnosis of  By: Zackary MD, Weston     Hearing impairment 04/28/2021  Heel spur, fracture, left 04/17/2018   HEMORRHOIDS, NOS 01/03/2007   Qualifier: Diagnosis of  By: Damien Folks     Hepatic steatosis 04/12/2011   Herpes simplex labialis 05/11/2011   Hip osteoarthritis 05/23/2007   MRI Pelvis (03/13/07) Bilateral osteoarthritis of the hips, left greater than right.  The  patient has a slightly more prominent hip effusion on the left than  the right.  No other significant abnormality.     History of MRSA infection 04/28/2011   HYPERCHOLESTEROLEMIA 12/24/2008   Hypertension    HYPERTENSION, BENIGN SYSTEMIC 01/03/2007   Qualifier: Diagnosis of  By: Damien Folks     HYPERTRIGLYCERIDEMIA 11/11/2007   Qualifier: Diagnosis of  By:  McDiarmid MD, Todd     Hypokalemia 12/12/2017   Hypothyroidism 02/15/2007   Qualifier: Diagnosis of  By: McDiarmid MD, Krystal BAND, BORDERLINE 02/15/2007   Insomnia 03/03/2021   Intra-abdominal adhesions    Irritable bowel syndrome 01/03/2007   Qualifier: Diagnosis of  By: Damien Folks     Knee pain, acute 12/01/2022   LACTOSE INTOLERANCE 03/10/2008   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   Leukoplakia of oral cavity 09/20/2022   Major depressive disorder, recurrent episode (HCC) 01/03/2007   Qualifier: Diagnosis of  By: Kivett, Whitney     Medication management 12/14/2016   Managing topiramate  for weight loss starting 12/14/16   Memory difficulties 03/04/2021   Metabolic syndrome 02/29/2012   Monoallelic mutation of BRIP1 gene 01/17/2021   c.2010dup (p.[id number]*)   NAFL (nonalcoholic fatty liver) 04/12/2011   Mild hepatic Steatosis on CT 04/08/11 for abdominal pain.      No impairment of memory 04/29/2021   Obesity (BMI 30.0-34.9) 09/30/2007   Has lost 55 lbs since easter. Goal of <190 by 08/05/12.      Obesity, Class III, BMI 40-49.9 (morbid obesity) 09/30/2007        Osteoarthritis of spine 01/03/2007   MR I Lumbar (08/03): GeneralDDD; L2-3 Large  HNP.  Mod pos  hnp - 12/14/2005, smhnpw/irritL4root - 12/14/2005     OSTEOARTHRITIS, HANDS, BILATERAL 06/18/2008   Qualifier: Diagnosis of  By: McDiarmid MD, Krystal BEAL FEMORAL STRESS SYNDROME 01/03/2007   Qualifier: Diagnosis of  By: Damien Folks     Periodic limb movements of sleep 12/05/2010   Diagnosed on Polysomnography testing Fall 2011.      Pneumonia    walking pneumonia in the 90's   Pre-diabetes 12/25/2008   Qualifier: Diagnosis of  By: McDiarmid MD, Todd     Prolapsed internal hemorrhoids, grade 3, s/p ligation/pexy/hemorrhoidectomy 03/28/2018 03/28/2018   Pulmonary nodules/lesions, multiple 03/02/2023   Pyriformis syndrome,  right 03/02/2023   RESTLESS LEGS SYNDROME 09/11/2007   Polysomnography (10/2010, Dr Quita Salt, interpreter. ) showed periodic limb movements that frequently awoke patient from sleep with arousal index of 4 per hour. Dr Francis Dresser (Sleep Specialist) thought her RLS was the major contributor to patient poor sleep condition, more than any sleep apnea.  He recommended considering opamine agonist (either requip or mirapex) to see if this will help.      Rosacea, acne 10/24/2011   SIGMOID POLYP 01/23/2008   Solitary lung nodule 04/08/2011   Stage 1 mild COPD by GOLD classification (HCC) 01/03/2007   11/28/2017 Chest CT: Mild centrilobular and paraseptal emphysema with mild diffuse bronchial wall thickening     Stress incontinence    Synovial cyst of lumbar facet joint 02/17/2016  Trigger thumb of left hand 08/16/2018   Type 2 diabetes mellitus with obesity (HCC) 06/30/2019   IMO SNOMED Dx Update Oct 2024     Urticaria, chronic 05/22/2011   VITAMIN D  DEFICIENCY 03/03/2010   Work-related condition 05/17/2018   Past Surgical History:  Procedure Laterality Date   APPENDECTOMY     bladder tack  1998   Dr Watt (urology)   BRONCHIAL BIOPSY  03/24/2024   Procedure: BRONCHOSCOPY, WITH BIOPSY;  Surgeon: Shelah Lamar RAMAN, MD;  Location[facility]COPY;  Service: Pulmonary;;   BRONCHIAL BRUSHINGS  03/24/2024   Procedure: BRONCHOSCOPY, WITH BRUSH BIOPSY;  Surgeon: Shelah Lamar RAMAN, MD;  Location: [facility] ENDOSCOPY;  Service: Pulmonary;;   BRONCHIAL NEEDLE ASPIRATION BIOPSY  03/24/2024   Procedure: BRONCHOSCOPY, WITH NEEDLE ASPIRATION BIOPSY;  Surgeon: Shelah Lamar RAMAN, MD;  Location: [facility] ENDOSCOPY;  Service: Pulmonary;;   BRONCHOSCOPY, WITH BIOPSY USING ELECTROMAGNETIC NAVIGATION Bilateral 03/24/2024   Procedure: BRONCHOSCOPY, WITH BIOPSY USING ELECTROMAGNETIC NAVIGATION;  Surgeon: Shelah Lamar RAMAN, MD;  Location: [facility] ENDOSCOPY;  Service: Pulmonary;  Laterality: Bilateral;  with Fluoro   BUNIONECTOMY  1992    bilateral feet with pins in great toes   CARDIAC CATHETERIZATION     CARPAL TUNNEL RELEASE  2010   Bilateral, Dr Lamar Sypher   COLONOSCOPY  2004   Dr DOROTHA Sous (GI)   CYSTOCELE REPAIR  1998    Dr Evangeline (GYN)-   ESOPHAGOGASTRODUODENOSCOPY     EVALUATION UNDER ANESTHESIA WITH HEMORRHOIDECTOMY N/A 03/28/2018   Procedure: ANORECTAL EXAM UNDER ANESTHESIA lateral internal partial sphincterotomy, hemorrhoidal ligation pixie, HEMORRHOIDECTOMY;  Surgeon: Sheldon Standing, MD;  Location: [facility] ORS;  Service: General;  Laterality: N/A;   EXAM ANORECTAL W/ ULTRASOUND     Dr. Sheldon 03-28-18    EXPLORATORY LAPAROTOMY     FACIAL LACERATIONS REPAIR     Facial Laceration repair as adolescent    FUDUCIAL PLACEMENT  03/24/2024   Procedure: INSERTION, FIDUCIAL MARKER, GOLD;  Surgeon: Shelah Lamar RAMAN, MD;  Location: [facility] ENDOSCOPY;  Service: Pulmonary;;   INJECTION HIP INTRA ARTICULAR  2008   Left Hip fluoroscopically-guided corticosteorid injection for painful osteoarthritis with good effect (06/2007)   LAPAROSCOPIC LYSIS OF ADHESIONS  05/19/2021   Procedure: LAPAROSCOPIC LYSIS OF ADHESIONS; BIOPSY ABDOMINAL SKIN LESION;  Surgeon: Viktoria Comer SAUNDERS, MD;  Location: [facility] OR[facility];  Service: Gynecology;;   LAPAROSCOPY FOR ECTOPIC PREGNANCY     LUMBAR LAMINECTOMY/DECOMPRESSION MICRODISCECTOMY N/A 06/21/2016   Procedure: MICRO LUMBAR DECOMPRESSION L4-L5 AND REMOVAL OF SYNOVIAL CYST    1 LEVEL;  Surgeon: Reyes Billing, MD;  Location: [facility] ORS;  Service: Orthopedics;  Laterality: N/A;   MRSA     NM MYOVIEW  LTD  2011   Dr Victory Sharps, III (Card)   RECTOCELE REPAIR  1998   Dr Evangeline (GYN)   ROBOTIC ASSISTED SALPINGO OOPHERECTOMY N/A 05/19/2021   Procedure: XI ROBOTIC ASSISTED LEFT SALPINGO- OOPHORECTOMY WITH PELVIC WASHINGS;  Surgeon: Viktoria Comer SAUNDERS, MD;  Location: [facility] ORS;  Service: Gynecology;  Laterality: N/A;   SPHINCTEROTOMY N/A 03/28/2018   Procedure: ANAL SPHINCTEROTOMY;  Surgeon: Sheldon Standing, MD;  Location: [facility] OR[facility];   Service: General;  Laterality: N/A;   UPPER GASTROINTESTINAL ENDOSCOPY     Patient Active Problem List   Diagnosis Date Noted   Right shoulder pain 06/13/2024   Non-small cell cancer of lower lobe of lung (HCC) 04/09/2024   Malignant adenocarcinoma of unspecified part of unspecified bronchus or lung (HCC) 04/06/2024   Episodic tension type headache 02/29/2024   Folliculitis,  left shin 02/29/2024   Emphysema of Lung on CT 01/10/2024   Pulmonary nodule 03/02/2023   Polypharmacy 04/29/2021   Adrenal adenoma, left 12/02/2020   Arteriosclerosis of abdominal aorta (HCC) 12/02/2020   Urinary incontinence, mixed 04/02/2020   Type 2 diabetes mellitus without complications (HCC) 05/22/2019   Coronary artery calcification seen on CT scan 12/03/2017   Spinal stenosis of lumbar region 06/21/2016   Encounter for chronic pain management 12/03/2014   Knee pain, bilateral 11/25/2013   Metabolic dysfunction-associated steatohepatitis (MASH) 04/12/2011    Class: Chronic   Vitamin D  deficiency 03/03/2010   History of tobacco abuse 11/10/2009   Hyperlipidemia associated with type 2 diabetes mellitus (HCC) 12/24/2008   Chronic pain syndrome 12/24/2008   Functional gastrointestinal disorder 06/18/2008   HYPERTRIGLYCERIDEMIA 11/11/2007   Obesity, Class III, BMI 40-49.9 (morbid obesity) 09/30/2007   RESTLESS LEGS SYNDROME 09/11/2007   Fibromyalgia syndrome 07/15/2007   Hypothyroidism 02/15/2007   Bipolar 2 disorder (HCC) 01/03/2007   Hypertension associated with diabetes (HCC) 01/03/2007   Allergic rhinitis 01/03/2007   GERD without esophagitis 01/03/2007   Irritable bowel syndrome 01/03/2007   Osteoarthritis of spine 01/03/2007    PCP: McDiarmid, Krystal BIRCH, MD   REFERRING PROVIDER: McDiarmid, Krystal BIRCH, MD   REFERRING DIAG:  M25.512 (ICD-10-CM) - Left shoulder pain, unspecified chronicity  M54.50,G89.29 (ICD-10-CM) - Chronic right-sided low back pain without sciatica  M43.06 (ICD-10-CM) - Lumbar  spondylolysis    Rationale for Evaluation and Treatment: Rehabilitation  THERAPY DIAG:  Acute pain of left shoulder  Weakness generalized  Chronic bilateral low back pain with right-sided sciatica  Other low back pain  ONSET DATE: L shoulder 2 weeks ago; chronic for low back pain SUBJECTIVE:                                                                                                                                                                                           SUBJECTIVE STATEMENT:  06/30/2024:  Pt reports that her shoulder was in extreme pain over the weekend and was unable to complete her HEP.  EVAL: Pt requested today's eval be directed for her L shoulder. Pt states she was lifting a 20# delivery from the ground which she believes injured her L shoulder. The pain did not start bothering her until the next day. Pt feel the L shoulder is getting better. She states having a chronic Hx of low back pain with surgery in 2017.  L handed  PERTINENT HISTORY:  High BMI, Lumbar laminectomy/decompression microdiscectomy; stage 1 lung CA; COPD, OA both hips, DM  PAIN:  Are you having pain? Yes: NPRS scale: 6/10. Range of  the pain the last: 4-6/10 Pain location: L GH area Pain description: Ache, throb Aggravating factors: Sleeping on it, reaching across body and reaching OH Relieving factors: Salonpas patches, heating pad  Yes: NPRS scale: 0/10 in sitting, 9/10 in standing Pain location: Low back and R LE Pain description: sharp Aggravating factors: standing, walking Relieving factors: Sitting  PRECAUTIONS: None  RED FLAGS: None   WEIGHT BEARING RESTRICTIONS: No  FALLS:  Has patient fallen in last 6 months? No  LIVING ENVIRONMENT: Lives with: lives with their family Lives in: House/apartment Able to access home. Level entry  OCCUPATION: Retired  PLOF: Independent with household mobility without device and Independent with community mobility with  device  PATIENT GOALS: Less pain and return to normal use  NEXT MD VISIT: 3 months  OBJECTIVE:  Note: Objective measures were completed at Evaluation unless otherwise noted.  DIAGNOSTIC FINDINGS:  See Epic, none for the shoulder and lumbar in 2017  PATIENT SURVEYS:  Quick Dash: 64%   Minimally Clinically Important Difference (MCID): 15-20 points  (Franchignoni, F. et al. (2013). Minimally clinically important difference of the disabilities of the arm, shoulder, and hand outcome measures (DASH) and its shortened version (Quick DASH). Journal of Orthopaedic & Sports Physical Therapy, 44(1), 30-39)   COGNITION: Overall cognitive status: Within functional limits for tasks assessed     SENSATION: WFL  MUSCLE LENGTH: Hamstrings: Right TBA deg; Left TBA deg Debby test: Right TBA deg; Left TBA deg  POSTURE: rounded shoulders and forward head  PALPATION: TTP to the posterior/lateral GH area  UPPER EXTREMITY ROM:   Active ROM Right eval Left eval  Shoulder flexion 130 130 pain at end range  Shoulder extension    Shoulder abduction    Shoulder adduction    Shoulder internal rotation T10 L5; pain at end range  Shoulder external rotation T3 Back of head; pain at end range  Elbow flexion    Elbow extension    Wrist flexion    Wrist extension    Wrist ulnar deviation    Wrist radial deviation    Wrist pronation    Wrist supination    (Blank rows = not tested)  UPPER EXTREMITY MMT:  MMT Right eval Left eval  Shoulder flexion 5 4+ p  Shoulder extension    Shoulder abduction 5 4+ p  Shoulder adduction    Shoulder internal rotation 5 4+ p  Shoulder external rotation 5 4+ p  Middle trapezius    Lower trapezius    Elbow flexion    Elbow extension    Wrist flexion    Wrist extension    Wrist ulnar deviation    Wrist radial deviation    Wrist pronation    Wrist supination    Grip strength (lbs)    (Blank rows = not tested)  SHOULDER SPECIAL TESTS: Impingement  tests: Hawkins/Kennedy impingement test: negative SLAP lesions: Clunk test: negative Instability tests: NT Rotator cuff assessment: Empty can test: negative, Full can test: negative, and Hornblower's sign: negativeAll tets reproduced pain, but were not overtly weak  Biceps assessment: Yergason's test: negative and Speed's test: negative  JOINT MOBILITY TESTING:  WNLs  LUMBAR ROM: TBA  AROM eval  Flexion   Extension   Right lateral flexion   Left lateral flexion   Right rotation   Left rotation    (Blank rows = not tested)  LOWER EXTREMITY ROM:  TBA  Active  Right eval Left eval  Hip flexion    Hip extension  Hip abduction    Hip adduction    Hip internal rotation    Hip external rotation    Knee flexion    Knee extension    Ankle dorsiflexion    Ankle plantarflexion    Ankle inversion    Ankle eversion     (Blank rows = not tested)  LOWER EXTREMITY MMT:  TBA  MMT Right eval Left eval  Hip flexion    Hip extension    Hip abduction    Hip adduction    Hip internal rotation    Hip external rotation    Knee flexion    Knee extension    Ankle dorsiflexion    Ankle plantarflexion    Ankle inversion    Ankle eversion     (Blank rows = not tested)  LUMBAR SPECIAL TESTS: TBA  FUNCTIONAL TESTS: 5xSTS- TBA  GAIT: TBA Distance walked:  Assistive device utilized:  Level of assistance:  Comments:   TREATMENT DATE:   [facility] Adult PT Treatment  06/30/2024:  Therapeutic Exercise: Seated row - YTB - 3x10 Seated shoulder row - YTB - 3x10 Seated bil ER with YTB - 3x5 Supine chest press with cane Supine flexion ROM w/ cane in relatively comfortable arc                                                                                                                 HOME EXERCISE PROGRAM: Access Code: [email or web address] URL: [email or web address] Date: 06/26/2024 Prepared by: Dasie Daft  Exercises - Shoulder External Rotation and Scapular Retraction  with Resistance  - 1 x daily - 7 x weekly - 2 sets - 10 reps - 3 hold - Standing Shoulder Row with Anchored Resistance  - 1 x daily - 7 x weekly - 2 sets - 10 reps - 3 hold - Shoulder Extension with Resistance  - 1 x daily - 7 x weekly - 2 sets - 10 reps - 3 hold  ASSESSMENT:  CLINICAL IMPRESSION: Kaiden tolerated session fair d/t pain.  Concentrated on reviewing HEP with addition of AAROM ROM.  Pt educated on acceptable pain levels during exercise.     OBJECTIVE IMPAIRMENTS: decreased activity tolerance, decreased ROM, decreased strength, impaired UE functional use, pain, and high BMI.   ACTIVITY LIMITATIONS: carrying, lifting, bending, standing, squatting, sleeping, stairs, bathing, toileting, dressing, reach over head, hygiene/grooming, locomotion level, and caring for others  PARTICIPATION LIMITATIONS: meal prep, cleaning, laundry, shopping, and community activity  PERSONAL FACTORS: High BMI, Lumbar laminectomy/decompression microdiscectomy; stage 1 lung CA; COPD, OA both hips, DM are also affecting patient's functional outcome.   REHAB POTENTIAL: Good  CLINICAL DECISION MAKING: Evolving/moderate complexity  EVALUATION COMPLEXITY: Moderate   GOALS:  SHORT TERM GOALS: Target date: 07/25/24  Pt will be Ind in an initial HEP  Baseline: Goal status: INITIAL  2.  Pt will report 25% or greater improvement in pain for L shoulder and low back for improved function and QOL Baseline:  Goal status: INITIAL   LONG TERM GOALS: Target date:  08/29/24  Pt will be Ind in a final HEP to maintain achieved LOF  Baseline:  Goal status: INITIAL  2.  Pt will report 75% or greater improvement in pain for L shoulder and 50% for low back for improved function and QOL Baseline:  Goal status: INITIAL  3.  Pt's L shoulder AROM for ER and IR will improve to functional levels approaching levels similar to the R Shoulder Baseline:  Goal status: INITIAL  4.  Pt will be able to lift 2# OH for  functional use of the L UE with daily activities Baseline:  Goal status: INITIAL  5.  Pt's Quick Hollis will improved by the MCID to 49% or better as indication of improved function  Baseline: 64% Goal status: INITIAL  PLAN:  PT FREQUENCY: 2x/week  PT DURATION: 8 weeks  PLANNED INTERVENTIONS: [id number]- PT Re-evaluation, 97110-Therapeutic exercises, 97530- Therapeutic activity, 97112- Neuromuscular re-education, 97535- Self Care, [id number]- Manual therapy, [phone]- Aquatic Therapy, [id number]- Electrical stimulation (unattended), [phone]- Ionotophoresis 4mg /ml Dexamethasone , 79439 (1-2 muscles), [id number] (3+ muscles)- Dry Needling, Patient/Family education, Taping, Joint mobilization, Spinal mobilization, Cryotherapy, and Moist heat.  PLAN FOR NEXT SESSION: Eval low back with the next 3 PT appts; assess response to HEP; progress therex as indicated; use of modalities, manual therapy; and TPDN as indicated.   Miron Marxen E Katrisha Segall PT 06/30/24 11:44 AM

## 2024-07-03 ENCOUNTER — Ambulatory Visit: Payer: Self-pay

## 2024-07-03 DIAGNOSIS — M25512 Pain in left shoulder: Secondary | ICD-10-CM | POA: Diagnosis not present

## 2024-07-03 DIAGNOSIS — M6281 Muscle weakness (generalized): Secondary | ICD-10-CM

## 2024-07-03 DIAGNOSIS — G8929 Other chronic pain: Secondary | ICD-10-CM

## 2024-07-03 DIAGNOSIS — R531 Weakness: Secondary | ICD-10-CM

## 2024-07-03 NOTE — Therapy (Signed)
 OUTPATIENT PHYSICAL THERAPY DAILY NOTE/Back Eval   Patient Name: [name] MRN: [id number] DOB:1951/04/08, 73 y.o., female Today's Date: 07/03/2024  END OF SESSION:  PT End of Session - 07/03/24 1738     Visit Number 3    Number of Visits 17    Date for PT Re-Evaluation 08/29/24    Authorization Type HTA    PT Start Time 1330    PT Stop Time 1415    PT Time Calculation (min) 45 min    Activity Tolerance Patient tolerated treatment well;Patient limited by pain    Behavior During Therapy [facility] for tasks assessed/performed            Past Medical History:  Diagnosis Date   Adrenal adenoma, left 12/02/2020   CT AP 11/16/20:  left adrenal mass measuring approximately 2.7 cm. This is minimally increased in size from prior study and is consistent with a benign adrenal adenoma.    Adrenal nodule (HCC) 04/08/2011   04/08/11 Abdominal CT showed Left adrenal nodule which is tachnically indeterminate but most likely an adenoma.  It is 1.7 cm and 42 HU on portal venous phase image. 31 HU on delayed images.   01/09/2012 Abdominal CT w/ & w/o CM: Stable benign left adrenal adenoma. No further specific follow-up is needed for this finding.     Allergic rhinitis 01/03/2007   Qualifier: Diagnosis of  By: Kivett, Whitney     Allergy    Anemia    hx of  in 20's   Anxiety    Bipolar disorder (HCC)    Carpal tunnel syndrome 02/14/2007   S/P carpel tunnel release bilaterally.     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 03/28/2018 03/28/2018   COPD WITHOUT EXACERBATION 01/03/2007   Qualifier: Diagnosis of  By: Damien Folks  emphysema   Degenerative joint disease of both hips 05/23/2007   CT AP 11/27/20: Thre are degenerative changes of both hips     Diabetes mellitus without complication (HCC)    A1C 6.5 as of 05-2019- diet changes, no meds currently    DISC WITH  RADICULOPATHY 01/03/2007   Qualifier: Diagnosis of  By: Damien Folks     Emphysema of lung (HCC)    Emphysema of Lung on CT 01/10/2024   Esophageal reflux 01/03/2007   Centricity Description: GASTROESOPHAGEAL REFLUX, NO ESOPHAGITIS Qualifier: Diagnosis of  By: Damien Folks   Centricity Description: GERD Qualifier: Diagnosis of  By: Zackary MD, Weston     External hemorrhoids s/p hemorrhoidectomy 03/28/2018 03/28/2018   Facial pain 09/20/2022   Family history of breast cancer    Family history of cancer of female genital organ    Family history of colon cancer    Family history of gene mutation    BRIP1 gene mutation (c.2010dup) in daughter   Family history of lung cancer    Fibromyalgia    Fracture of bone spur of inferior portion of calcaneus 10/06/2018   Functional gastrointestinal disorder 06/18/2008   Functional Gastrointestinal Disorder with frequent eructation and bloating sensation with acute flares.  Responsive to Compazine  and Bentyl .  Takes a few days to calm down.     GERD without esophagitis 01/03/2007   Centricity Description: GASTROESOPHAGEAL REFLUX, NO ESOPHAGITIS Qualifier: Diagnosis of  By: Damien Folks   Centricity Description: GERD Qualifier: Diagnosis of  By: Zackary MD, Weston     Hearing impairment 04/28/2021  Heel spur, fracture, left 04/17/2018   HEMORRHOIDS, NOS 01/03/2007   Qualifier: Diagnosis of  By: Damien Folks     Hepatic steatosis 04/12/2011   Herpes simplex labialis 05/11/2011   Hip osteoarthritis 05/23/2007   MRI Pelvis (03/13/07) Bilateral osteoarthritis of the hips, left greater than right.  The  patient has a slightly more prominent hip effusion on the left than  the right.  No other significant abnormality.     History of MRSA infection 04/28/2011   HYPERCHOLESTEROLEMIA 12/24/2008   Hypertension    HYPERTENSION, BENIGN SYSTEMIC 01/03/2007   Qualifier: Diagnosis of  By: Damien Folks     HYPERTRIGLYCERIDEMIA 11/11/2007   Qualifier:  Diagnosis of  By: McDiarmid MD, Todd     Hypokalemia 12/12/2017   Hypothyroidism 02/15/2007   Qualifier: Diagnosis of  By: McDiarmid MD, Krystal BAND, BORDERLINE 02/15/2007   Insomnia 03/03/2021   Intra-abdominal adhesions    Irritable bowel syndrome 01/03/2007   Qualifier: Diagnosis of  By: Damien Folks     Knee pain, acute 12/01/2022   LACTOSE INTOLERANCE 03/10/2008   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   Leukoplakia of oral cavity 09/20/2022   Major depressive disorder, recurrent episode (HCC) 01/03/2007   Qualifier: Diagnosis of  By: Kivett, Whitney     Medication management 12/14/2016   Managing topiramate  for weight loss starting 12/14/16   Memory difficulties 03/04/2021   Metabolic syndrome 02/29/2012   Monoallelic mutation of BRIP1 gene 01/17/2021   c.2010dup (p.[id number]*)   NAFL (nonalcoholic fatty liver) 04/12/2011   Mild hepatic Steatosis on CT 04/08/11 for abdominal pain.      No impairment of memory 04/29/2021   Obesity (BMI 30.0-34.9) 09/30/2007   Has lost 55 lbs since easter. Goal of <190 by 08/05/12.      Obesity, Class III, BMI 40-49.9 (morbid obesity) 09/30/2007        Osteoarthritis of spine 01/03/2007   MR I Lumbar (08/03): GeneralDDD; L2-3 Large  HNP.  Mod pos  hnp - 12/14/2005, smhnpw/irritL4root - 12/14/2005     OSTEOARTHRITIS, HANDS, BILATERAL 06/18/2008   Qualifier: Diagnosis of  By: McDiarmid MD, Krystal BEAL FEMORAL STRESS SYNDROME 01/03/2007   Qualifier: Diagnosis of  By: Damien Folks     Periodic limb movements of sleep 12/05/2010   Diagnosed on Polysomnography testing Fall 2011.      Pneumonia    walking pneumonia in the 90's   Pre-diabetes 12/25/2008   Qualifier: Diagnosis of  By: McDiarmid MD, Todd     Prolapsed internal hemorrhoids, grade 3, s/p ligation/pexy/hemorrhoidectomy 03/28/2018 03/28/2018   Pulmonary nodules/lesions, multiple 03/02/2023    Pyriformis syndrome, right 03/02/2023   RESTLESS LEGS SYNDROME 09/11/2007   Polysomnography (10/2010, Dr Quita Salt, interpreter. ) showed periodic limb movements that frequently awoke patient from sleep with arousal index of 4 per hour. Dr Francis Dresser (Sleep Specialist) thought her RLS was the major contributor to patient poor sleep condition, more than any sleep apnea.  He recommended considering opamine agonist (either requip or mirapex) to see if this will help.      Rosacea, acne 10/24/2011   SIGMOID POLYP 01/23/2008   Solitary lung nodule 04/08/2011   Stage 1 mild COPD by GOLD classification (HCC) 01/03/2007   11/28/2017 Chest CT: Mild centrilobular and paraseptal emphysema with mild diffuse bronchial wall thickening     Stress incontinence    Synovial cyst of lumbar facet joint 02/17/2016  Trigger thumb of left hand 08/16/2018   Type 2 diabetes mellitus with obesity (HCC) 06/30/2019   IMO SNOMED Dx Update Oct 2024     Urticaria, chronic 05/22/2011   VITAMIN D  DEFICIENCY 03/03/2010   Work-related condition 05/17/2018   Past Surgical History:  Procedure Laterality Date   APPENDECTOMY     bladder tack  1998   Dr Watt (urology)   BRONCHIAL BIOPSY  03/24/2024   Procedure: BRONCHOSCOPY, WITH BIOPSY;  Surgeon: Shelah Lamar RAMAN, MD;  Location: [facility] ENDOSCOPY;  Service: Pulmonary;;   BRONCHIAL BRUSHINGS  03/24/2024   Procedure: BRONCHOSCOPY, WITH BRUSH BIOPSY;  Surgeon: Shelah Lamar RAMAN, MD;  Location: [facility] ENDOSCOPY;  Service: Pulmonary;;   BRONCHIAL NEEDLE ASPIRATION BIOPSY  03/24/2024   Procedure: BRONCHOSCOPY, WITH NEEDLE ASPIRATION BIOPSY;  Surgeon: Shelah Lamar RAMAN, MD;  Location: [facility] ENDOSCOPY;  Service: Pulmonary;;   BRONCHOSCOPY, WITH BIOPSY USING ELECTROMAGNETIC NAVIGATION Bilateral 03/24/2024   Procedure: BRONCHOSCOPY, WITH BIOPSY USING ELECTROMAGNETIC NAVIGATION;  Surgeon: Shelah Lamar RAMAN, MD;  Location: [facility] ENDOSCOPY;  Service: Pulmonary;  Laterality: Bilateral;  with Fluoro    BUNIONECTOMY  1992   bilateral feet with pins in great toes   CARDIAC CATHETERIZATION     CARPAL TUNNEL RELEASE  2010   Bilateral, Dr Lamar Sypher   COLONOSCOPY  2004   Dr DOROTHA Sous (GI)   CYSTOCELE REPAIR  1998    Dr Evangeline (GYN)-   ESOPHAGOGASTRODUODENOSCOPY     EVALUATION UNDER ANESTHESIA WITH HEMORRHOIDECTOMY N/A 03/28/2018   Procedure: ANORECTAL EXAM UNDER ANESTHESIA lateral internal partial sphincterotomy, hemorrhoidal ligation pixie, HEMORRHOIDECTOMY;  Surgeon: Sheldon Standing, MD;  Location: [facility] ORS;  Service: General;  Laterality: N/A;   EXAM ANORECTAL W/ ULTRASOUND     Dr. Sheldon 03-28-18    EXPLORATORY LAPAROTOMY     FACIAL LACERATIONS REPAIR     Facial Laceration repair as adolescent    FUDUCIAL PLACEMENT  03/24/2024   Procedure: INSERTION, FIDUCIAL MARKER, GOLD;  Surgeon: Shelah Lamar RAMAN, MD;  Location: [facility] ENDOSCOPY;  Service: Pulmonary;;   INJECTION HIP INTRA ARTICULAR  2008   Left Hip fluoroscopically-guided corticosteorid injection for painful osteoarthritis with good effect (06/2007)   LAPAROSCOPIC LYSIS OF ADHESIONS  05/19/2021   Procedure: LAPAROSCOPIC LYSIS OF ADHESIONS; BIOPSY ABDOMINAL SKIN LESION;  Surgeon: Viktoria Comer SAUNDERS, MD;  Location: [facility] ORS;  Service: Gynecology;;   LAPAROSCOPY FOR ECTOPIC PREGNANCY     LUMBAR LAMINECTOMY/DECOMPRESSION MICRODISCECTOMY N/A 06/21/2016   Procedure: MICRO LUMBAR DECOMPRESSION L4-L5 AND REMOVAL OF SYNOVIAL CYST    1 LEVEL;  Surgeon: Reyes Billing, MD;  Location: [facility] ORS;  Service: Orthopedics;  Laterality: N/A;   MRSA     NM MYOVIEW  LTD  2011   Dr Victory Sharps, III (Card)   RECTOCELE REPAIR  1998   Dr Evangeline (GYN)   ROBOTIC ASSISTED SALPINGO OOPHERECTOMY N/A 05/19/2021   Procedure: XI ROBOTIC ASSISTED LEFT SALPINGO- OOPHORECTOMY WITH PELVIC WASHINGS;  Surgeon: Viktoria Comer SAUNDERS, MD;  Location: [facility] ORS;  Service: Gynecology;  Laterality: N/A;   SPHINCTEROTOMY N/A 03/28/2018   Procedure: ANAL SPHINCTEROTOMY;  Surgeon: Sheldon Standing,  MD;  Location: [facility] ORS;  Service: General;  Laterality: N/A;   UPPER GASTROINTESTINAL ENDOSCOPY     Patient Active Problem List   Diagnosis Date Noted   Right shoulder pain 06/13/2024   Non-small cell cancer of lower lobe of lung (HCC) 04/09/2024   Malignant adenocarcinoma of unspecified part of unspecified bronchus or lung (HCC) 04/06/2024   Episodic tension type headache 02/29/2024   Folliculitis,  left shin 02/29/2024   Emphysema of Lung on CT 01/10/2024   Pulmonary nodule 03/02/2023   Polypharmacy 04/29/2021   Adrenal adenoma, left 12/02/2020   Arteriosclerosis of abdominal aorta (HCC) 12/02/2020   Urinary incontinence, mixed 04/02/2020   Type 2 diabetes mellitus without complications (HCC) 05/22/2019   Coronary artery calcification seen on CT scan 12/03/2017   Spinal stenosis of lumbar region 06/21/2016   Encounter for chronic pain management 12/03/2014   Knee pain, bilateral 11/25/2013   Metabolic dysfunction-associated steatohepatitis (MASH) 04/12/2011    Class: Chronic   Vitamin D  deficiency 03/03/2010   History of tobacco abuse 11/10/2009   Hyperlipidemia associated with type 2 diabetes mellitus (HCC) 12/24/2008   Chronic pain syndrome 12/24/2008   Functional gastrointestinal disorder 06/18/2008   HYPERTRIGLYCERIDEMIA 11/11/2007   Obesity, Class III, BMI 40-49.9 (morbid obesity) 09/30/2007   RESTLESS LEGS SYNDROME 09/11/2007   Fibromyalgia syndrome 07/15/2007   Hypothyroidism 02/15/2007   Bipolar 2 disorder (HCC) 01/03/2007   Hypertension associated with diabetes (HCC) 01/03/2007   Allergic rhinitis 01/03/2007   GERD without esophagitis 01/03/2007   Irritable bowel syndrome 01/03/2007   Osteoarthritis of spine 01/03/2007    PCP: McDiarmid, Krystal BIRCH, MD   REFERRING PROVIDER: McDiarmid, Krystal BIRCH, MD   REFERRING DIAG:  M25.512 (ICD-10-CM) - Left shoulder pain, unspecified chronicity  M54.50,G89.29 (ICD-10-CM) - Chronic right-sided low back pain without sciatica   M43.06 (ICD-10-CM) - Lumbar spondylolysis    Rationale for Evaluation and Treatment: Rehabilitation  THERAPY DIAG:  Acute pain of left shoulder  Weakness generalized  Chronic bilateral low back pain with right-sided sciatica  Muscle weakness (generalized)  ONSET DATE: L shoulder 2 weeks ago; chronic for low back pain SUBJECTIVE:                                                                                                                                                                                           SUBJECTIVE STATEMENT: 07/03/2024:  Pt reports she is experiencing increased L shoulder pain 2 days after both her PT visits (not the day after). Today the pain is decreased. Pt reports her her low back and R hip/gluteal pain limits her walking and standing to a few minutes  EVAL: Pt requested today's eval be directed for her L shoulder. Pt states she was lifting a 20# delivery from the ground which she believes injured her L shoulder. The pain did not start bothering her until the next day. Pt feel the L shoulder is getting better. She states having a chronic Hx of low back pain with surgery in 2017.  L handed  PERTINENT HISTORY:  High BMI, Lumbar laminectomy/decompression  microdiscectomy; stage 1 lung CA; COPD, OA both hips, DM  PAIN:  Are you having pain? Yes: NPRS scale: 6/10. Range of the pain the last: 2/10 Pain location: L GH area Pain description: Ache, throb Aggravating factors: Sleeping on it, reaching across body and reaching OH Relieving factors: Salonpas patches, heating pad  Yes: NPRS scale: 0/10 in sitting, 9/10 in standing Pain location: Low back and R LE Pain description: sharp Aggravating factors: standing, walking Relieving factors: Sitting  PRECAUTIONS: None  RED FLAGS: None   WEIGHT BEARING RESTRICTIONS: No  FALLS:  Has patient fallen in last 6 months? No  LIVING ENVIRONMENT: Lives with: lives with their family Lives in:  House/apartment Able to access home. Level entry  OCCUPATION: Retired  PLOF: Independent with household mobility without device and Independent with community mobility with device  PATIENT GOALS: Less pain and return to normal use  NEXT MD VISIT: 3 months  OBJECTIVE:  Note: Objective measures were completed at Evaluation unless otherwise noted.  DIAGNOSTIC FINDINGS:  See Epic, none for the shoulder and lumbar in 2017  PATIENT SURVEYS:  Quick Dash: 64%   Minimally Clinically Important Difference (MCID): 15-20 points  (Franchignoni, F. et al. (2013). Minimally clinically important difference of the disabilities of the arm, shoulder, and hand outcome measures (DASH) and its shortened version (Quick DASH). Journal of Orthopaedic & Sports Physical Therapy, 44(1), 30-39)   COGNITION: Overall cognitive status: Within functional limits for tasks assessed     SENSATION: WFL  MUSCLE LENGTH: Hamstrings: Right TBA deg; Left TBA deg Debby test: Right TBA deg; Left TBA deg  POSTURE: rounded shoulders and forward head; Increased lordosis  PALPATION: TTP to the posterior/lateral GH area  UPPER EXTREMITY ROM:   Active ROM Right eval Left eval  Shoulder flexion 130 130 pain at end range  Shoulder extension    Shoulder abduction    Shoulder adduction    Shoulder internal rotation T10 L5; pain at end range  Shoulder external rotation T3 Back of head; pain at end range  Elbow flexion    Elbow extension    Wrist flexion    Wrist extension    Wrist ulnar deviation    Wrist radial deviation    Wrist pronation    Wrist supination    (Blank rows = not tested)  UPPER EXTREMITY MMT:  MMT Right eval Left eval  Shoulder flexion 5 4+ p  Shoulder extension    Shoulder abduction 5 4+ p  Shoulder adduction    Shoulder internal rotation 5 4+ p  Shoulder external rotation 5 4+ p  Middle trapezius    Lower trapezius    Elbow flexion    Elbow extension    Wrist flexion    Wrist  extension    Wrist ulnar deviation    Wrist radial deviation    Wrist pronation    Wrist supination    Grip strength (lbs)    (Blank rows = not tested)  SHOULDER SPECIAL TESTS: Impingement tests: Hawkins/Kennedy impingement test: negative SLAP lesions: Clunk test: negative Instability tests: NT Rotator cuff assessment: Empty can test: negative, Full can test: negative, and Hornblower's sign: negativeAll tets reproduced pain, but were not overtly weak  Biceps assessment: Yergason's test: negative and Speed's test: negative  JOINT MOBILITY TESTING:  WNLs  LUMBAR ROM: TBA. 07/03/24  AROM eval  Flexion Min limited;lordosis remains with forward bending  Extension Min limited  Right lateral flexion Min limited  Left lateral flexion Min limited  Right rotation  Min limited  Left rotation Min limited   (Blank rows = not tested)  LOWER EXTREMITY ROM:  TBA. 07/03/24-WFLs  Active  Right eval Left eval  Hip flexion    Hip extension    Hip abduction    Hip adduction    Hip internal rotation    Hip external rotation    Knee flexion    Knee extension    Ankle dorsiflexion    Ankle plantarflexion    Ankle inversion    Ankle eversion     (Blank rows = not tested)  LOWER EXTREMITY MMT:  TBA. 07/03/24-WFLs  MMT Right eval Left eval  Hip flexion    Hip extension    Hip abduction    Hip adduction    Hip internal rotation    Hip external rotation    Knee flexion    Knee extension    Ankle dorsiflexion    Ankle plantarflexion    Ankle inversion    Ankle eversion     (Blank rows = not tested)  LUMBAR SPECIAL TESTS: TBA. 07/03/24 NT  FUNCTIONAL TESTS: 5xSTS- TBA  GAIT: TBA Distance walked: 200' Assistive device utilized: SPC Level of assistance: Mod ind Comments: Decreased pace  TREATMENT DATE:  [facility] Adult PT Treatment  07/03/2024: Therapeutic Exercise: Seated row - YTB - 2x10 Seated shoulder row - YTB - 3x10 Seated bil ER with YTB - 3x5 Supine chest press with  cane Supine flexion ROM w/ cane in relatively comfortable arc Seated Flexion Stretch with Swiss Ball 10 reps - 10 hold Supine March 10 reps Supine Lower Trunk Rotation 10 reps - 5 hold Supine Posterior Pelvic Tilt 10 reps - 3 hold  [facility] Adult PT Treatment  06/30/2024: Therapeutic Exercise: Seated row - YTB - 3x10 Seated shoulder row - YTB - 3x10 Seated bil ER with YTB - 3x5 Supine chest press with cane Supine flexion ROM w/ cane in relatively comfortable arc                                                                                                                 HOME EXERCISE PROGRAM: Access Code: [email or web address] URL: [email or web address] Date: 07/03/2024 Prepared by: Dasie Daft  Exercises - Shoulder External Rotation and Scapular Retraction with Resistance  - 1 x daily - 4 x weekly - 2 sets - 10 reps - 3 hold - Standing Shoulder Row with Anchored Resistance  - 1 x daily - 4 x weekly - 2 sets - 10 reps - 3 hold - Shoulder Extension with Resistance  - 1 x daily - 4 x weekly - 2 sets - 10 reps - 3 hold - Supine Shoulder Press AAROM in Abduction with Dowel  - 1 x daily - 4 x weekly - 1 sets - 10 reps - 3 hold - Supine Shoulder Flexion Extension AAROM with Dowel  - 1 x daily - 4 x weekly - 1 sets - 10 reps - 3 hold - Seated Flexion Stretch with Swiss Ball  - 2 x daily -  7 x weekly - 1 sets - 10 reps - 10 hold - Supine March  - 2 x daily - 7 x weekly - 1 sets - 10 reps - Supine Lower Trunk Rotation  - 2 x daily - 7 x weekly - 1 sets - 10 reps - 5 hold - Supine Posterior Pelvic Tilt  - 2 x daily - 7 x weekly - 1 sets - 10 reps - 3 hold  ASSESSMENT: CLINICAL IMPRESSION: PT was completed for assessment of her low back. AROMs were not significant limited, but lordosis remain c forward bending. LBP is increased with standing/walking and relieved with sitting. PT was completed today for lumbopelvic flexibility and strengthening c a flexion bias. The sets for shoulder exs were  decreased and pt is to complete every other day. Pt tolerated PT today without adverse effects. Pt will continue to benefit from skilled PT to address impairments for improved L shoulder and back function c minimized pain.    OBJECTIVE IMPAIRMENTS: decreased activity tolerance, decreased ROM, decreased strength, impaired UE functional use, pain, and high BMI.   ACTIVITY LIMITATIONS: carrying, lifting, bending, standing, squatting, sleeping, stairs, bathing, toileting, dressing, reach over head, hygiene/grooming, locomotion level, and caring for others  PARTICIPATION LIMITATIONS: meal prep, cleaning, laundry, shopping, and community activity  PERSONAL FACTORS: High BMI, Lumbar laminectomy/decompression microdiscectomy; stage 1 lung CA; COPD, OA both hips, DM are also affecting patient's functional outcome.   REHAB POTENTIAL: Good  CLINICAL DECISION MAKING: Evolving/moderate complexity  EVALUATION COMPLEXITY: Moderate   GOALS:  SHORT TERM GOALS: Target date: 07/25/24  Pt will be Ind in an initial HEP  Baseline: Goal status: INITIAL  2.  Pt will report 25% or greater improvement in pain for L shoulder and low back for improved function and QOL Baseline:  Goal status: INITIAL   LONG TERM GOALS: Target date: 08/29/24  Pt will be Ind in a final HEP to maintain achieved LOF  Baseline:  Goal status: INITIAL  2.  Pt will report 75% or greater improvement in pain for L shoulder and 50% for low back for improved function and QOL Baseline:  Goal status: INITIAL  3.  Pt's L shoulder AROM for ER and IR will improve to functional levels approaching levels similar to the R Shoulder Baseline:  Goal status: INITIAL  4.  Pt will be able to lift 2# OH for functional use of the L UE with daily activities Baseline:  Goal status: INITIAL  5.  Pt's Quick Hollis will improved by the MCID to 49% or better as indication of improved function  Baseline: 64% Goal status: INITIAL  PLAN:  PT  FREQUENCY: 2x/week  PT DURATION: 8 weeks  PLANNED INTERVENTIONS: [id number]- PT Re-evaluation, 97110-Therapeutic exercises, 97530- Therapeutic activity, 97112- Neuromuscular re-education, 97535- Self Care, [id number]- Manual therapy, [phone]- Aquatic Therapy, [id number]- Electrical stimulation (unattended), [phone]- Ionotophoresis 4mg /ml Dexamethasone , 79439 (1-2 muscles), 20561 (3+ muscles)- Dry Needling, Patient/Family education, Taping, Joint mobilization, Spinal mobilization, Cryotherapy, and Moist heat.  PLAN FOR NEXT SESSION: Eval low back with the next 3 PT appts; assess response to HEP; progress therex as indicated; use of modalities, manual therapy; and TPDN as indicated.   Cuma Polyakov PT 07/03/24 5:54 PM

## 2024-07-09 ENCOUNTER — Encounter: Payer: Self-pay | Admitting: Physical Therapy

## 2024-07-09 ENCOUNTER — Ambulatory Visit: Attending: Family Medicine | Admitting: Physical Therapy

## 2024-07-09 DIAGNOSIS — M5441 Lumbago with sciatica, right side: Secondary | ICD-10-CM | POA: Insufficient documentation

## 2024-07-09 DIAGNOSIS — M6281 Muscle weakness (generalized): Secondary | ICD-10-CM | POA: Insufficient documentation

## 2024-07-09 DIAGNOSIS — R531 Weakness: Secondary | ICD-10-CM | POA: Insufficient documentation

## 2024-07-09 DIAGNOSIS — M25512 Pain in left shoulder: Secondary | ICD-10-CM | POA: Insufficient documentation

## 2024-07-09 DIAGNOSIS — G8929 Other chronic pain: Secondary | ICD-10-CM | POA: Diagnosis not present

## 2024-07-09 NOTE — Therapy (Signed)
 OUTPATIENT PHYSICAL THERAPY DAILY NOTE/Back Eval   Patient Name: [name] MRN: [id number] DOB:[birth year], 73 y.o., female Today's Date: 07/09/2024  END OF SESSION:  PT End of Session - 07/09/24 1314     Visit Number 4    Number of Visits 17    Date for PT Re-Evaluation 08/29/24    Authorization Type HTA    PT Start Time 1315    PT Stop Time 1357    PT Time Calculation (min) 42 min            Past Medical History:  Diagnosis Date   Adrenal adenoma, left 12/02/2020   CT AP 11/16/20:  left adrenal mass measuring approximately 2.7 cm. This is minimally increased in size from prior study and is consistent with a benign adrenal adenoma.    Adrenal nodule (HCC) 04/08/2011   04/08/11 Abdominal CT showed Left adrenal nodule which is tachnically indeterminate but most likely an adenoma.  It is 1.7 cm and 42 HU on portal venous phase image. 31 HU on delayed images.   01/09/2012 Abdominal CT w/ & w/o CM: Stable benign left adrenal adenoma. No further specific follow-up is needed for this finding.     Allergic rhinitis 01/03/2007   Qualifier: Diagnosis of  By: Kivett, Whitney     Allergy    Anemia    hx of  in 20's   Anxiety    Bipolar disorder (HCC)    Carpal tunnel syndrome 02/14/2007   S/P carpel tunnel release bilaterally.     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 03/28/2018 03/28/2018   COPD WITHOUT EXACERBATION 01/03/2007   Qualifier: Diagnosis of  By: Damien Folks  emphysema   Degenerative joint disease of both hips 05/23/2007   CT AP 11/27/20: Thre are degenerative changes of both hips     Diabetes mellitus without complication (HCC)    A1C 6.5 as of 05-2019- diet changes, no meds currently    DISC WITH RADICULOPATHY 01/03/2007   Qualifier: Diagnosis of  By: Damien Folks     Emphysema of lung (HCC)    Emphysema of Lung on CT 01/10/2024   Esophageal  reflux 01/03/2007   Centricity Description: GASTROESOPHAGEAL REFLUX, NO ESOPHAGITIS Qualifier: Diagnosis of  By: Damien Folks   Centricity Description: GERD Qualifier: Diagnosis of  By: Zackary MD, Weston     External hemorrhoids s/p hemorrhoidectomy 03/28/2018 03/28/2018   Facial pain 09/20/2022   Family history of breast cancer    Family history of cancer of female genital organ    Family history of colon cancer    Family history of gene mutation    BRIP1 gene mutation (c.2010dup) in daughter   Family history of lung cancer    Fibromyalgia    Fracture of bone spur of inferior portion of calcaneus 10/06/2018   Functional gastrointestinal disorder 06/18/2008   Functional Gastrointestinal Disorder with frequent eructation and bloating sensation with acute flares.  Responsive to Compazine  and Bentyl .  Takes a few days to calm down.     GERD without esophagitis 01/03/2007   Centricity Description: GASTROESOPHAGEAL REFLUX, NO ESOPHAGITIS Qualifier: Diagnosis of  By: Damien Folks   Centricity Description: GERD Qualifier: Diagnosis of  By: Zackary MD, Weston     Hearing impairment 04/28/2021   Heel spur, fracture, left 04/17/2018   HEMORRHOIDS, NOS 01/03/2007   Qualifier: Diagnosis of  By: Damien Folks  Hepatic steatosis 04/12/2011   Herpes simplex labialis 05/11/2011   Hip osteoarthritis 05/23/2007   MRI Pelvis (03/13/07) Bilateral osteoarthritis of the hips, left greater than right.  The  patient has a slightly more prominent hip effusion on the left than  the right.  No other significant abnormality.     History of MRSA infection 04/28/2011   HYPERCHOLESTEROLEMIA 12/24/2008   Hypertension    HYPERTENSION, BENIGN SYSTEMIC 01/03/2007   Qualifier: Diagnosis of  By: Damien Folks     HYPERTRIGLYCERIDEMIA 11/11/2007   Qualifier: Diagnosis of  By: McDiarmid MD, Todd     Hypokalemia 12/12/2017   Hypothyroidism 02/15/2007   Qualifier: Diagnosis of  By: McDiarmid MD, Krystal BAND, BORDERLINE 02/15/2007   Insomnia 03/03/2021   Intra-abdominal adhesions    Irritable bowel syndrome 01/03/2007   Qualifier: Diagnosis of  By: Damien Folks     Knee pain, acute 12/01/2022   LACTOSE INTOLERANCE 03/10/2008   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   Leukoplakia of oral cavity 09/20/2022   Major depressive disorder, recurrent episode (HCC) 01/03/2007   Qualifier: Diagnosis of  By: Kivett, Whitney     Medication management 12/14/2016   Managing topiramate  for weight loss starting 12/14/16   Memory difficulties 03/04/2021   Metabolic syndrome 02/29/2012   Monoallelic mutation of BRIP1 gene 01/17/2021   c.2010dup (p.[id number]*)   NAFL (nonalcoholic fatty liver) 04/12/2011   Mild hepatic Steatosis on CT 04/08/11 for abdominal pain.      No impairment of memory 04/29/2021   Obesity (BMI 30.0-34.9) 09/30/2007   Has lost 55 lbs since easter. Goal of <190 by 08/05/12.      Obesity, Class III, BMI 40-49.9 (morbid obesity) 09/30/2007        Osteoarthritis of spine 01/03/2007   MR I Lumbar (08/03): GeneralDDD; L2-3 Large  HNP.  Mod pos  hnp - 12/14/2005, smhnpw/irritL4root - 12/14/2005     OSTEOARTHRITIS, HANDS, BILATERAL 06/18/2008   Qualifier: Diagnosis of  By: McDiarmid MD, Krystal BEAL FEMORAL STRESS SYNDROME 01/03/2007   Qualifier: Diagnosis of  By: Damien Folks     Periodic limb movements of sleep 12/05/2010   Diagnosed on Polysomnography testing Fall 2011.      Pneumonia    walking pneumonia in the 90's   Pre-diabetes 12/25/2008   Qualifier: Diagnosis of  By: McDiarmid MD, Todd     Prolapsed internal hemorrhoids, grade 3, s/p ligation/pexy/hemorrhoidectomy 03/28/2018 03/28/2018   Pulmonary nodules/lesions, multiple 03/02/2023   Pyriformis syndrome, right 03/02/2023   RESTLESS LEGS SYNDROME 09/11/2007   Polysomnography (10/2010, Dr Quita Salt, interpreter. ) showed periodic limb  movements that frequently awoke patient from sleep with arousal index of 4 per hour. Dr Francis Dresser (Sleep Specialist) thought her RLS was the major contributor to patient poor sleep condition, more than any sleep apnea.  He recommended considering opamine agonist (either requip or mirapex) to see if this will help.      Rosacea, acne 10/24/2011   SIGMOID POLYP 01/23/2008   Solitary lung nodule 04/08/2011   Stage 1 mild COPD by GOLD classification (HCC) 01/03/2007   11/28/2017 Chest CT: Mild centrilobular and paraseptal emphysema with mild diffuse bronchial wall thickening     Stress incontinence    Synovial cyst of lumbar facet joint 02/17/2016   Trigger thumb of left hand 08/16/2018   Type 2 diabetes mellitus with obesity (HCC) 06/30/2019   IMO SNOMED Dx  Update Oct 2024     Urticaria, chronic 05/22/2011   VITAMIN D  DEFICIENCY 03/03/2010   Work-related condition 05/17/2018   Past Surgical History:  Procedure Laterality Date   APPENDECTOMY     bladder tack  1998   Dr Watt (urology)   BRONCHIAL BIOPSY  03/24/2024   Procedure: BRONCHOSCOPY, WITH BIOPSY;  Surgeon: Shelah Lamar RAMAN, MD;  Location: [facility] ENDOSCOPY;  Service: Pulmonary;;   BRONCHIAL BRUSHINGS  03/24/2024   Procedure: BRONCHOSCOPY, WITH BRUSH BIOPSY;  Surgeon: Shelah Lamar RAMAN, MD;  Location: [facility] ENDOSCOPY;  Service: Pulmonary;;   BRONCHIAL NEEDLE ASPIRATION BIOPSY  03/24/2024   Procedure: BRONCHOSCOPY, WITH NEEDLE ASPIRATION BIOPSY;  Surgeon: Shelah Lamar RAMAN, MD;  Location: [facility] ENDOSCOPY;  Service: Pulmonary;;   BRONCHOSCOPY, WITH BIOPSY USING ELECTROMAGNETIC NAVIGATION Bilateral 03/24/2024   Procedure: BRONCHOSCOPY, WITH BIOPSY USING ELECTROMAGNETIC NAVIGATION;  Surgeon: Shelah Lamar RAMAN, MD;  Location: [facility] ENDOSCOPY;  Service: Pulmonary;  Laterality: Bilateral;  with Fluoro   BUNIONECTOMY  1992   bilateral feet with pins in great toes   CARDIAC CATHETERIZATION     CARPAL TUNNEL RELEASE  2010   Bilateral, Dr Lamar Sypher    COLONOSCOPY  2004   Dr DOROTHA Sous (GI)   CYSTOCELE REPAIR  1998    Dr Evangeline (GYN)-   ESOPHAGOGASTRODUODENOSCOPY     EVALUATION UNDER ANESTHESIA WITH HEMORRHOIDECTOMY N/A 03/28/2018   Procedure: ANORECTAL EXAM UNDER ANESTHESIA lateral internal partial sphincterotomy, hemorrhoidal ligation pixie, HEMORRHOIDECTOMY;  Surgeon: Sheldon Standing, MD;  Location: [facility] ORS;  Service: General;  Laterality: N/A;   EXAM ANORECTAL W/ ULTRASOUND     Dr. Sheldon 03-28-18    EXPLORATORY LAPAROTOMY     FACIAL LACERATIONS REPAIR     Facial Laceration repair as adolescent    FUDUCIAL PLACEMENT  03/24/2024   Procedure: INSERTION, FIDUCIAL MARKER, GOLD;  Surgeon: Shelah Lamar RAMAN, MD;  Location: [facility] ENDOSCOPY;  Service: Pulmonary;;   INJECTION HIP INTRA ARTICULAR  2008   Left Hip fluoroscopically-guided corticosteorid injection for painful osteoarthritis with good effect (06/2007)   LAPAROSCOPIC LYSIS OF ADHESIONS  05/19/2021   Procedure: LAPAROSCOPIC LYSIS OF ADHESIONS; BIOPSY ABDOMINAL SKIN LESION;  Surgeon: Viktoria Comer SAUNDERS, MD;  Location: [facility] OR[facility];  Service: Gynecology;;   LAPAROSCOPY FOR ECTOPIC PREGNANCY     LUMBAR LAMINECTOMY/DECOMPRESSION MICRODISCECTOMY N/A 06/21/2016   Procedure: MICRO LUMBAR DECOMPRESSION L4-L5 AND REMOVAL OF SYNOVIAL CYST    1 LEVEL;  Surgeon: Reyes Billing, MD;  Location: [facility] OR[facility];  Service: Orthopedics;  Laterality: N/A;   MRSA     NM MYOVIEW  LTD  2011   Dr Victory Sharps, III (Card)   RECTOCELE REPAIR  1998   Dr Evangeline (GYN)   ROBOTIC ASSISTED SALPINGO OOPHERECTOMY N/A 05/19/2021   Procedure: XI ROBOTIC ASSISTED LEFT SALPINGO- OOPHORECTOMY WITH PELVIC WASHINGS;  Surgeon: Viktoria Comer SAUNDERS, MD;  Location: [facility];  Service: Gynecology;  Laterality: N/A;   SPHINCTEROTOMY N/A 03/28/2018   Procedure: ANAL SPHINCTEROTOMY;  Surgeon: Sheldon Standing, MD;  Location: [facility];  Service: General;  Laterality: N/A;   UPPER GASTROINTESTINAL ENDOSCOPY     Patient Active Problem List   Diagnosis Date Noted    Right shoulder pain 06/13/2024   Non-small cell cancer of lower lobe of lung (HCC) 04/09/2024   Malignant adenocarcinoma of unspecified part of unspecified bronchus or lung (HCC) 04/06/2024   Episodic tension type headache 02/29/2024   Folliculitis, left shin 02/29/2024   Emphysema of Lung on CT 01/10/2024   Pulmonary nodule 03/02/2023   Polypharmacy 04/29/2021  Adrenal adenoma, left 12/02/2020   Arteriosclerosis of abdominal aorta (HCC) 12/02/2020   Urinary incontinence, mixed 04/02/2020   Type 2 diabetes mellitus without complications (HCC) 05/22/2019   Coronary artery calcification seen on CT scan 12/03/2017   Spinal stenosis of lumbar region 06/21/2016   Encounter for chronic pain management 12/03/2014   Knee pain, bilateral 11/25/2013   Metabolic dysfunction-associated steatohepatitis (MASH) 04/12/2011    Class: Chronic   Vitamin D  deficiency 03/03/2010   History of tobacco abuse 11/10/2009   Hyperlipidemia associated with type 2 diabetes mellitus (HCC) 12/24/2008   Chronic pain syndrome 12/24/2008   Functional gastrointestinal disorder 06/18/2008   HYPERTRIGLYCERIDEMIA 11/11/2007   Obesity, Class III, BMI 40-49.9 (morbid obesity) 09/30/2007   RESTLESS LEGS SYNDROME 09/11/2007   Fibromyalgia syndrome 07/15/2007   Hypothyroidism 02/15/2007   Bipolar 2 disorder (HCC) 01/03/2007   Hypertension associated with diabetes (HCC) 01/03/2007   Allergic rhinitis 01/03/2007   GERD without esophagitis 01/03/2007   Irritable bowel syndrome 01/03/2007   Osteoarthritis of spine 01/03/2007    PCP: McDiarmid, Krystal BIRCH, MD   REFERRING PROVIDER: McDiarmid, Krystal BIRCH, MD   REFERRING DIAG:  M25.512 (ICD-10-CM) - Left shoulder pain, unspecified chronicity  M54.50,G89.29 (ICD-10-CM) - Chronic right-sided low back pain without sciatica  M43.06 (ICD-10-CM) - Lumbar spondylolysis    Rationale for Evaluation and Treatment: Rehabilitation  THERAPY DIAG:  Acute pain of left shoulder  Weakness  generalized  ONSET DATE: L shoulder 2 weeks ago; chronic for low back pain SUBJECTIVE:                                                                                                                                                                                           SUBJECTIVE STATEMENT: 07/09/2024:  I have not completed my HEP and I still sleep on my left side despite moving my sugar sensor to the other arm. Did have shoulder pain the next day after last PT session. I am having shooting pain in shoulder today. Low back is ok because I haven't been on feet much.    EVAL: Pt requested today's eval be directed for her L shoulder. Pt states she was lifting a 20# delivery from the ground which she believes injured her L shoulder. The pain did not start bothering her until the next day. Pt feel the L shoulder is getting better. She states having a chronic Hx of low back pain with surgery in 2017.  L handed  PERTINENT HISTORY:  High BMI, Lumbar laminectomy/decompression microdiscectomy; stage 1 lung CA; COPD, OA both hips, DM  PAIN:  Are you having pain? Yes: NPRS scale: 6/10. Range of the  pain the last: 2/10 Pain location: L GH area Pain description: Ache, throb Aggravating factors: Sleeping on it, reaching across body and reaching OH Relieving factors: Salonpas patches, heating pad  Yes: NPRS scale: 0/10 in sitting, 9/10 in standing Pain location: Low back and R LE Pain description: sharp Aggravating factors: standing, walking Relieving factors: Sitting  PRECAUTIONS: None  RED FLAGS: None   WEIGHT BEARING RESTRICTIONS: No  FALLS:  Has patient fallen in last 6 months? No  LIVING ENVIRONMENT: Lives with: lives with their family Lives in: House/apartment Able to access home. Level entry  OCCUPATION: Retired  PLOF: Independent with household mobility without device and Independent with community mobility with device  PATIENT GOALS: Less pain and return to normal  use  NEXT MD VISIT: 3 months  OBJECTIVE:  Note: Objective measures were completed at Evaluation unless otherwise noted.  DIAGNOSTIC FINDINGS:  See Epic, none for the shoulder and lumbar in 2017  PATIENT SURVEYS:  Quick Dash: 64%   Minimally Clinically Important Difference (MCID): 15-20 points  (Franchignoni, F. et al. (2013). Minimally clinically important difference of the disabilities of the arm, shoulder, and hand outcome measures (DASH) and its shortened version (Quick DASH). Journal of Orthopaedic & Sports Physical Therapy, 44(1), 30-39)   COGNITION: Overall cognitive status: Within functional limits for tasks assessed     SENSATION: WFL  MUSCLE LENGTH: Hamstrings: Right TBA deg; Left TBA deg Debby test: Right TBA deg; Left TBA deg  POSTURE: rounded shoulders and forward head; Increased lordosis  PALPATION: TTP to the posterior/lateral GH area  UPPER EXTREMITY ROM:   Active ROM Right eval Left eval  Shoulder flexion 130 130 pain at end range  Shoulder extension    Shoulder abduction    Shoulder adduction    Shoulder internal rotation T10 L5; pain at end range  Shoulder external rotation T3 Back of head; pain at end range  Elbow flexion    Elbow extension    Wrist flexion    Wrist extension    Wrist ulnar deviation    Wrist radial deviation    Wrist pronation    Wrist supination    (Blank rows = not tested)  UPPER EXTREMITY MMT:  MMT Right eval Left eval  Shoulder flexion 5 4+ p  Shoulder extension    Shoulder abduction 5 4+ p  Shoulder adduction    Shoulder internal rotation 5 4+ p  Shoulder external rotation 5 4+ p  Middle trapezius    Lower trapezius    Elbow flexion    Elbow extension    Wrist flexion    Wrist extension    Wrist ulnar deviation    Wrist radial deviation    Wrist pronation    Wrist supination    Grip strength (lbs)    (Blank rows = not tested)  SHOULDER SPECIAL TESTS: Impingement tests: Hawkins/Kennedy impingement  test: negative SLAP lesions: Clunk test: negative Instability tests: NT Rotator cuff assessment: Empty can test: negative, Full can test: negative, and Hornblower's sign: negativeAll tets reproduced pain, but were not overtly weak  Biceps assessment: Yergason's test: negative and Speed's test: negative  JOINT MOBILITY TESTING:  WNLs  LUMBAR ROM: TBA. 07/03/24  AROM eval  Flexion Min limited;lordosis remains with forward bending  Extension Min limited  Right lateral flexion Min limited  Left lateral flexion Min limited  Right rotation Min limited  Left rotation Min limited   (Blank rows = not tested)  LOWER EXTREMITY ROM:  TBA. 07/03/24-WFLs  Active  Right eval Left eval  Hip flexion    Hip extension    Hip abduction    Hip adduction    Hip internal rotation    Hip external rotation    Knee flexion    Knee extension    Ankle dorsiflexion    Ankle plantarflexion    Ankle inversion    Ankle eversion     (Blank rows = not tested)  LOWER EXTREMITY MMT:  TBA. 07/03/24-WFLs  MMT Right eval Left eval  Hip flexion    Hip extension    Hip abduction    Hip adduction    Hip internal rotation    Hip external rotation    Knee flexion    Knee extension    Ankle dorsiflexion    Ankle plantarflexion    Ankle inversion    Ankle eversion     (Blank rows = not tested)  LUMBAR SPECIAL TESTS: TBA. 07/03/24 NT  FUNCTIONAL TESTS: 5xSTS- TBA  GAIT: TBA Distance walked: 200' Assistive device utilized: SPC Level of assistance: Mod ind Comments: Decreased pace  TREATMENT DATE:  [facility] Adult PT Treatment:                                                DATE: 07/09/24 Therapeutic Exercise/There Act Supine marching  LTR  Gluteal squeeze/PPT Supine dowel chest press and pullover  PPT with ball squeeze  Supine chest press  Supine horiz abdct/addct with cane  Supine ER AAROM with cane  Seated lumbar flexion stretch with laterals Seated red band ER to fatigue Seated horiz abdct  RED band to fatigue    [facility] Adult PT Treatment  07/03/2024: Therapeutic Exercise: Seated row - YTB - 2x10 Seated shoulder row - YTB - 3x10 Seated bil ER with YTB - 3x5 Supine chest press with cane Supine flexion ROM w/ cane in relatively comfortable arc Seated Flexion Stretch with Swiss Ball 10 reps - 10 hold Supine March 10 reps Supine Lower Trunk Rotation 10 reps - 5 hold Supine Posterior Pelvic Tilt 10 reps - 3 hold  [facility] Adult PT Treatment  06/30/2024: Therapeutic Exercise: Seated row - YTB - 3x10 Seated shoulder row - YTB - 3x10 Seated bil ER with YTB - 3x5 Supine chest press with cane Supine flexion ROM w/ cane in relatively comfortable arc                                                                                                                 HOME EXERCISE PROGRAM: Access Code: [email or web address] URL: [email or web address] Date: 07/03/2024 Prepared by: Dasie Daft  Exercises - Shoulder External Rotation and Scapular Retraction with Resistance  - 1 x daily - 4 x weekly - 2 sets - 10 reps - 3 hold - Standing Shoulder Row with Anchored Resistance  - 1 x daily - 4 x weekly - 2 sets - 10 reps - 3  hold - Shoulder Extension with Resistance  - 1 x daily - 4 x weekly - 2 sets - 10 reps - 3 hold - Supine Shoulder Press AAROM in Abduction with Dowel  - 1 x daily - 4 x weekly - 1 sets - 10 reps - 3 hold - Supine Shoulder Flexion Extension AAROM with Dowel  - 1 x daily - 4 x weekly - 1 sets - 10 reps - 3 hold - Seated Flexion Stretch with Swiss Ball  - 2 x daily - 7 x weekly - 1 sets - 10 reps - 10 hold - Supine March  - 2 x daily - 7 x weekly - 1 sets - 10 reps - Supine Lower Trunk Rotation  - 2 x daily - 7 x weekly - 1 sets - 10 reps - 5 hold - Supine Posterior Pelvic Tilt  - 2 x daily - 7 x weekly - 1 sets - 10 reps - 3 hold  ASSESSMENT: CLINICAL IMPRESSION: Pt reports that she has not completed HEP due to being stressed over the last week. Her brother was recently  admitted to the hospital and is in ICU. Continued with mat based core strength as well as supine and seated shoulder AAROM and strengthening. She tolerated the session well feeling good stretches in her shoulder. Pt tolerated PT today without adverse effects. Pt will continue to benefit from skilled PT to address impairments for improved L shoulder and back function c minimized pain.    OBJECTIVE IMPAIRMENTS: decreased activity tolerance, decreased ROM, decreased strength, impaired UE functional use, pain, and high BMI.   ACTIVITY LIMITATIONS: carrying, lifting, bending, standing, squatting, sleeping, stairs, bathing, toileting, dressing, reach over head, hygiene/grooming, locomotion level, and caring for others  PARTICIPATION LIMITATIONS: meal prep, cleaning, laundry, shopping, and community activity  PERSONAL FACTORS: High BMI, Lumbar laminectomy/decompression microdiscectomy; stage 1 lung CA; COPD, OA both hips, DM are also affecting patient's functional outcome.   REHAB POTENTIAL: Good  CLINICAL DECISION MAKING: Evolving/moderate complexity  EVALUATION COMPLEXITY: Moderate   GOALS:  SHORT TERM GOALS: Target date: 07/25/24  Pt will be Ind in an initial HEP  Baseline: Goal status: INITIAL  2.  Pt will report 25% or greater improvement in pain for L shoulder and low back for improved function and QOL Baseline:  Goal status: INITIAL   LONG TERM GOALS: Target date: 08/29/24  Pt will be Ind in a final HEP to maintain achieved LOF  Baseline:  Goal status: INITIAL  2.  Pt will report 75% or greater improvement in pain for L shoulder and 50% for low back for improved function and QOL Baseline:  Goal status: INITIAL  3.  Pt's L shoulder AROM for ER and IR will improve to functional levels approaching levels similar to the R Shoulder Baseline:  Goal status: INITIAL  4.  Pt will be able to lift 2# OH for functional use of the L UE with daily activities Baseline:  Goal status:  INITIAL  5.  Pt's Quick Hollis will improved by the MCID to 49% or better as indication of improved function  Baseline: 64% Goal status: INITIAL  PLAN:  PT FREQUENCY: 2x/week  PT DURATION: 8 weeks  PLANNED INTERVENTIONS: 97164- PT Re-evaluation, 97110-Therapeutic exercises, 97530- Therapeutic activity, 97112- Neuromuscular re-education, 97535- Self Care, [id number]- Manual therapy, [phone]- Aquatic Therapy, [id number]- Electrical stimulation (unattended), [phone]- Ionotophoresis 4mg /ml Dexamethasone , 79439 (1-2 muscles), 20561 (3+ muscles)- Dry Needling, Patient/Family education, Taping, Joint mobilization, Spinal mobilization, Cryotherapy, and Moist heat.  PLAN  FOR NEXT SESSION: Eval low back with the next 3 PT appts; assess response to HEP; progress therex as indicated; use of modalities, manual therapy; and TPDN as indicated.   Harlene Persons, PTA 07/09/24 2:03 PM Phone: [phone] Fax: [phone]

## 2024-07-11 ENCOUNTER — Encounter: Admitting: Physical Therapy

## 2024-07-14 NOTE — Therapy (Signed)
 OUTPATIENT PHYSICAL THERAPY DAILY NOTE/Back Eval   Patient Name: [name] MRN: [id number] DOB:May 25, 1951, 73 y.o., female Today's Date: 07/15/2024  END OF SESSION:  PT End of Session - 07/15/24 1332     Visit Number 5    Number of Visits 17    Date for PT Re-Evaluation 08/29/24    Authorization Type HTA    PT Start Time 1331    PT Stop Time 1415    PT Time Calculation (min) 44 min             Past Medical History:  Diagnosis Date   Adrenal adenoma, left 12/02/2020   CT AP 11/16/20:  left adrenal mass measuring approximately 2.7 cm. This is minimally increased in size from prior study and is consistent with a benign adrenal adenoma.    Adrenal nodule (HCC) 04/08/2011   04/08/11 Abdominal CT showed Left adrenal nodule which is tachnically indeterminate but most likely an adenoma.  It is 1.7 cm and 42 HU on portal venous phase image. 31 HU on delayed images.   01/09/2012 Abdominal CT w/ & w/o CM: Stable benign left adrenal adenoma. No further specific follow-up is needed for this finding.     Allergic rhinitis 01/03/2007   Qualifier: Diagnosis of  By: Kivett, Whitney     Allergy    Anemia    hx of  in 20's   Anxiety    Bipolar disorder (HCC)    Carpal tunnel syndrome 02/14/2007   S/P carpel tunnel release bilaterally.     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 03/28/2018 03/28/2018   COPD WITHOUT EXACERBATION 01/03/2007   Qualifier: Diagnosis of  By: Damien Folks  emphysema   Degenerative joint disease of both hips 05/23/2007   CT AP 11/27/20: Thre are degenerative changes of both hips     Diabetes mellitus without complication (HCC)    A1C 6.5 as of 05-2019- diet changes, no meds currently    DISC WITH RADICULOPATHY 01/03/2007   Qualifier: Diagnosis of  By: Damien Folks     Emphysema of lung (HCC)    Emphysema of Lung on CT 01/10/2024    Esophageal reflux 01/03/2007   Centricity Description: GASTROESOPHAGEAL REFLUX, NO ESOPHAGITIS Qualifier: Diagnosis of  By: Damien Folks   Centricity Description: GERD Qualifier: Diagnosis of  By: Zackary MD, Weston     External hemorrhoids s/p hemorrhoidectomy 03/28/2018 03/28/2018   Facial pain 09/20/2022   Family history of breast cancer    Family history of cancer of female genital organ    Family history of colon cancer    Family history of gene mutation    BRIP1 gene mutation (c.2010dup) in daughter   Family history of lung cancer    Fibromyalgia    Fracture of bone spur of inferior portion of calcaneus 10/06/2018   Functional gastrointestinal disorder 06/18/2008   Functional Gastrointestinal Disorder with frequent eructation and bloating sensation with acute flares.  Responsive to Compazine  and Bentyl .  Takes a few days to calm down.     GERD without esophagitis 01/03/2007   Centricity Description: GASTROESOPHAGEAL REFLUX, NO ESOPHAGITIS Qualifier: Diagnosis of  By: Damien Folks   Centricity Description: GERD Qualifier: Diagnosis of  By: Zackary MD, Weston     Hearing impairment 04/28/2021   Heel spur, fracture, left 04/17/2018   HEMORRHOIDS, NOS 01/03/2007   Qualifier: Diagnosis of  By: Damien Folks  Hepatic steatosis 04/12/2011   Herpes simplex labialis 05/11/2011   Hip osteoarthritis 05/23/2007   MRI Pelvis (03/13/07) Bilateral osteoarthritis of the hips, left greater than right.  The  patient has a slightly more prominent hip effusion on the left than  the right.  No other significant abnormality.     History of MRSA infection 04/28/2011   HYPERCHOLESTEROLEMIA 12/24/2008   Hypertension    HYPERTENSION, BENIGN SYSTEMIC 01/03/2007   Qualifier: Diagnosis of  By: Damien Folks     HYPERTRIGLYCERIDEMIA 11/11/2007   Qualifier: Diagnosis of  By: McDiarmid MD, Todd     Hypokalemia 12/12/2017   Hypothyroidism 02/15/2007   Qualifier: Diagnosis of  By: McDiarmid MD, Krystal BAND, BORDERLINE 02/15/2007   Insomnia 03/03/2021   Intra-abdominal adhesions    Irritable bowel syndrome 01/03/2007   Qualifier: Diagnosis of  By: Damien Folks     Knee pain, acute 12/01/2022   LACTOSE INTOLERANCE 03/10/2008   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   Leukoplakia of oral cavity 09/20/2022   Major depressive disorder, recurrent episode (HCC) 01/03/2007   Qualifier: Diagnosis of  By: Kivett, Whitney     Medication management 12/14/2016   Managing topiramate  for weight loss starting 12/14/16   Memory difficulties 03/04/2021   Metabolic syndrome 02/29/2012   Monoallelic mutation of BRIP1 gene 01/17/2021   c.2010dup (p.[id number]*)   NAFL (nonalcoholic fatty liver) 04/12/2011   Mild hepatic Steatosis on CT 04/08/11 for abdominal pain.      No impairment of memory 04/29/2021   Obesity (BMI 30.0-34.9) 09/30/2007   Has lost 55 lbs since easter. Goal of <190 by 08/05/12.      Obesity, Class III, BMI 40-49.9 (morbid obesity) 09/30/2007        Osteoarthritis of spine 01/03/2007   MR I Lumbar (08/03): GeneralDDD; L2-3 Large  HNP.  Mod pos  hnp - 12/14/2005, smhnpw/irritL4root - 12/14/2005     OSTEOARTHRITIS, HANDS, BILATERAL 06/18/2008   Qualifier: Diagnosis of  By: McDiarmid MD, Krystal BEAL FEMORAL STRESS SYNDROME 01/03/2007   Qualifier: Diagnosis of  By: Damien Folks     Periodic limb movements of sleep 12/05/2010   Diagnosed on Polysomnography testing Fall 2011.      Pneumonia    walking pneumonia in the 90's   Pre-diabetes 12/25/2008   Qualifier: Diagnosis of  By: McDiarmid MD, Todd     Prolapsed internal hemorrhoids, grade 3, s/p ligation/pexy/hemorrhoidectomy 03/28/2018 03/28/2018   Pulmonary nodules/lesions, multiple 03/02/2023   Pyriformis syndrome, right 03/02/2023   RESTLESS LEGS SYNDROME 09/11/2007   Polysomnography (10/2010, Dr Quita Salt, interpreter. ) showed periodic  limb movements that frequently awoke patient from sleep with arousal index of 4 per hour. Dr Francis Dresser (Sleep Specialist) thought her RLS was the major contributor to patient poor sleep condition, more than any sleep apnea.  He recommended considering opamine agonist (either requip or mirapex) to see if this will help.      Rosacea, acne 10/24/2011   SIGMOID POLYP 01/23/2008   Solitary lung nodule 04/08/2011   Stage 1 mild COPD by GOLD classification (HCC) 01/03/2007   11/28/2017 Chest CT: Mild centrilobular and paraseptal emphysema with mild diffuse bronchial wall thickening     Stress incontinence    Synovial cyst of lumbar facet joint 02/17/2016   Trigger thumb of left hand 08/16/2018   Type 2 diabetes mellitus with obesity (HCC) 06/30/2019   IMO SNOMED Dx  Update Oct 2024     Urticaria, chronic 05/22/2011   VITAMIN D  DEFICIENCY 03/03/2010   Work-related condition 05/17/2018   Past Surgical History:  Procedure Laterality Date   APPENDECTOMY     bladder tack  1998   Dr Watt (urology)   BRONCHIAL BIOPSY  03/24/2024   Procedure: BRONCHOSCOPY, WITH BIOPSY;  Surgeon: Shelah Lamar RAMAN, MD;  Location: [facility] ENDOSCOPY;  Service: Pulmonary;;   BRONCHIAL BRUSHINGS  03/24/2024   Procedure: BRONCHOSCOPY, WITH BRUSH BIOPSY;  Surgeon: Shelah Lamar RAMAN, MD;  Location: [facility] ENDOSCOPY;  Service: Pulmonary;;   BRONCHIAL NEEDLE ASPIRATION BIOPSY  03/24/2024   Procedure: BRONCHOSCOPY, WITH NEEDLE ASPIRATION BIOPSY;  Surgeon: Shelah Lamar RAMAN, MD;  Location: [facility] ENDOSCOPY;  Service: Pulmonary;;   BRONCHOSCOPY, WITH BIOPSY USING ELECTROMAGNETIC NAVIGATION Bilateral 03/24/2024   Procedure: BRONCHOSCOPY, WITH BIOPSY USING ELECTROMAGNETIC NAVIGATION;  Surgeon: Shelah Lamar RAMAN, MD;  Location: [facility] ENDOSCOPY;  Service: Pulmonary;  Laterality: Bilateral;  with Fluoro   BUNIONECTOMY  1992   bilateral feet with pins in great toes   CARDIAC CATHETERIZATION     CARPAL TUNNEL RELEASE  2010   Bilateral, Dr Lamar Sypher    COLONOSCOPY  2004   Dr DOROTHA Sous (GI)   CYSTOCELE REPAIR  1998    Dr Evangeline (GYN)-   ESOPHAGOGASTRODUODENOSCOPY     EVALUATION UNDER ANESTHESIA WITH HEMORRHOIDECTOMY N/A 03/28/2018   Procedure: ANORECTAL EXAM UNDER ANESTHESIA lateral internal partial sphincterotomy, hemorrhoidal ligation pixie, HEMORRHOIDECTOMY;  Surgeon: Sheldon Standing, MD;  Location: [facility] ORS;  Service: General;  Laterality: N/A;   EXAM ANORECTAL W/ ULTRASOUND     Dr. Sheldon 03-28-18    EXPLORATORY LAPAROTOMY     FACIAL LACERATIONS REPAIR     Facial Laceration repair as adolescent    FUDUCIAL PLACEMENT  03/24/2024   Procedure: INSERTION, FIDUCIAL MARKER, GOLD;  Surgeon: Shelah Lamar RAMAN, MD;  Location: [facility] ENDOSCOPY;  Service: Pulmonary;;   INJECTION HIP INTRA ARTICULAR  2008   Left Hip fluoroscopically-guided corticosteorid injection for painful osteoarthritis with good effect (06/2007)   LAPAROSCOPIC LYSIS OF ADHESIONS  05/19/2021   Procedure: LAPAROSCOPIC LYSIS OF ADHESIONS; BIOPSY ABDOMINAL SKIN LESION;  Surgeon: Viktoria Comer SAUNDERS, MD;  Location: [facility] ORS;  Service: Gynecology;;   LAPAROSCOPY FOR ECTOPIC PREGNANCY     LUMBAR LAMINECTOMY/DECOMPRESSION MICRODISCECTOMY N/A 06/21/2016   Procedure: MICRO LUMBAR DECOMPRESSION L4-L5 AND REMOVAL OF SYNOVIAL CYST    1 LEVEL;  Surgeon: Reyes Billing, MD;  Location: [facility] ORS;  Service: Orthopedics;  Laterality: N/A;   MRSA     NM MYOVIEW  LTD  2011   Dr Victory Sharps, III (Card)   RECTOCELE REPAIR  1998   Dr Evangeline (GYN)   ROBOTIC ASSISTED SALPINGO OOPHERECTOMY N/A 05/19/2021   Procedure: XI ROBOTIC ASSISTED LEFT SALPINGO- OOPHORECTOMY WITH PELVIC WASHINGS;  Surgeon: Viktoria Comer SAUNDERS, MD;  Location: [facility] ORS;  Service: Gynecology;  Laterality: N/A;   SPHINCTEROTOMY N/A 03/28/2018   Procedure: ANAL SPHINCTEROTOMY;  Surgeon: Sheldon Standing, MD;  Location: [facility] ORS;  Service: General;  Laterality: N/A;   UPPER GASTROINTESTINAL ENDOSCOPY     Patient Active Problem List   Diagnosis Date Noted    Right shoulder pain 06/13/2024   Non-small cell cancer of lower lobe of lung (HCC) 04/09/2024   Malignant adenocarcinoma of unspecified part of unspecified bronchus or lung (HCC) 04/06/2024   Episodic tension type headache 02/29/2024   Folliculitis, left shin 02/29/2024   Emphysema of Lung on CT 01/10/2024   Pulmonary nodule 03/02/2023   Polypharmacy 04/29/2021  Adrenal adenoma, left 12/02/2020   Arteriosclerosis of abdominal aorta (HCC) 12/02/2020   Urinary incontinence, mixed 04/02/2020   Type 2 diabetes mellitus without complications (HCC) 05/22/2019   Coronary artery calcification seen on CT scan 12/03/2017   Spinal stenosis of lumbar region 06/21/2016   Encounter for chronic pain management 12/03/2014   Knee pain, bilateral 11/25/2013   Metabolic dysfunction-associated steatohepatitis (MASH) 04/12/2011    Class: Chronic   Vitamin D  deficiency 03/03/2010   History of tobacco abuse 11/10/2009   Hyperlipidemia associated with type 2 diabetes mellitus (HCC) 12/24/2008   Chronic pain syndrome 12/24/2008   Functional gastrointestinal disorder 06/18/2008   HYPERTRIGLYCERIDEMIA 11/11/2007   Obesity, Class III, BMI 40-49.9 (morbid obesity) 09/30/2007   RESTLESS LEGS SYNDROME 09/11/2007   Fibromyalgia syndrome 07/15/2007   Hypothyroidism 02/15/2007   Bipolar 2 disorder (HCC) 01/03/2007   Hypertension associated with diabetes (HCC) 01/03/2007   Allergic rhinitis 01/03/2007   GERD without esophagitis 01/03/2007   Irritable bowel syndrome 01/03/2007   Osteoarthritis of spine 01/03/2007    PCP: McDiarmid, Krystal BIRCH, MD   REFERRING PROVIDER: McDiarmid, Krystal BIRCH, MD   REFERRING DIAG:  M25.512 (ICD-10-CM) - Left shoulder pain, unspecified chronicity  M54.50,G89.29 (ICD-10-CM) - Chronic right-sided low back pain without sciatica  M43.06 (ICD-10-CM) - Lumbar spondylolysis    Rationale for Evaluation and Treatment: Rehabilitation  THERAPY DIAG:  Acute pain of left shoulder  Weakness  generalized  Chronic bilateral low back pain with right-sided sciatica  Muscle weakness (generalized)  ONSET DATE: L shoulder 2 weeks ago; chronic for low back pain SUBJECTIVE:                                                                                                                                                                                           SUBJECTIVE STATEMENT: 07/15/2024:  Pt reports she has been sleeping on the R side, switched insulin  monitor to L upper arm, which has helped her L shoulder pain. She notes her sciatica is not as bad.  EVAL: Pt requested today's eval be directed for her L shoulder. Pt states she was lifting a 20# delivery from the ground which she believes injured her L shoulder. The pain did not start bothering her until the next day. Pt feel the L shoulder is getting better. She states having a chronic Hx of low back pain with surgery in 2017.  L handed  PERTINENT HISTORY:  High BMI, Lumbar laminectomy/decompression microdiscectomy; stage 1 lung CA; COPD, OA both hips, DM  PAIN:  Are you having pain? Yes: NPRS scale: 07/15/2024 2/10.  Pain location: L GH area Pain description: Ache, throb Aggravating factors:  Sleeping on it, reaching across body and reaching OH Relieving factors: Salonpas patches, heating pad  Yes: NPRS scale: 0/10 in sitting, 9/10 in standing. 07/15/2024 5-6/10  Pain location: Low back and R LE Pain description: sharp Aggravating factors: standing, walking Relieving factors: Sitting  PRECAUTIONS: None  RED FLAGS: None   WEIGHT BEARING RESTRICTIONS: No  FALLS:  Has patient fallen in last 6 months? No  LIVING ENVIRONMENT: Lives with: lives with their family Lives in: House/apartment Able to access home. Level entry  OCCUPATION: Retired  PLOF: Independent with household mobility without device and Independent with community mobility with device  PATIENT GOALS: Less pain and return to normal use  NEXT MD VISIT: 3  months  OBJECTIVE:  Note: Objective measures were completed at Evaluation unless otherwise noted.  DIAGNOSTIC FINDINGS:  See Epic, none for the shoulder and lumbar in 2017  PATIENT SURVEYS:  Quick Dash: 64%   Minimally Clinically Important Difference (MCID): 15-20 points  (Franchignoni, F. et al. (2013). Minimally clinically important difference of the disabilities of the arm, shoulder, and hand outcome measures (DASH) and its shortened version (Quick DASH). Journal of Orthopaedic & Sports Physical Therapy, 44(1), 30-39)   COGNITION: Overall cognitive status: Within functional limits for tasks assessed     SENSATION: WFL  MUSCLE LENGTH: Hamstrings: Right TBA deg; Left TBA deg Debby test: Right TBA deg; Left TBA deg  POSTURE: rounded shoulders and forward head; Increased lordosis  PALPATION: TTP to the posterior/lateral GH area  UPPER EXTREMITY ROM:   Active ROM Right eval Left eval  Shoulder flexion 130 130 pain at end range  Shoulder extension    Shoulder abduction    Shoulder adduction    Shoulder internal rotation T10 L5; pain at end range  Shoulder external rotation T3 Back of head; pain at end range  Elbow flexion    Elbow extension    Wrist flexion    Wrist extension    Wrist ulnar deviation    Wrist radial deviation    Wrist pronation    Wrist supination    (Blank rows = not tested)  UPPER EXTREMITY MMT:  MMT Right eval Left eval  Shoulder flexion 5 4+ p  Shoulder extension    Shoulder abduction 5 4+ p  Shoulder adduction    Shoulder internal rotation 5 4+ p  Shoulder external rotation 5 4+ p  Middle trapezius    Lower trapezius    Elbow flexion    Elbow extension    Wrist flexion    Wrist extension    Wrist ulnar deviation    Wrist radial deviation    Wrist pronation    Wrist supination    Grip strength (lbs)    (Blank rows = not tested)  SHOULDER SPECIAL TESTS: Impingement tests: Hawkins/Kennedy impingement test: negative SLAP  lesions: Clunk test: negative Instability tests: NT Rotator cuff assessment: Empty can test: negative, Full can test: negative, and Hornblower's sign: negativeAll tets reproduced pain, but were not overtly weak  Biceps assessment: Yergason's test: negative and Speed's test: negative  JOINT MOBILITY TESTING:  WNLs  LUMBAR ROM: TBA. 07/03/24  AROM eval  Flexion Min limited;lordosis remains with forward bending  Extension Min limited  Right lateral flexion Min limited  Left lateral flexion Min limited  Right rotation Min limited  Left rotation Min limited   (Blank rows = not tested)  LOWER EXTREMITY ROM:  TBA. 07/03/24-WFLs  Active  Right eval Left eval  Hip flexion    Hip extension  Hip abduction    Hip adduction    Hip internal rotation    Hip external rotation    Knee flexion    Knee extension    Ankle dorsiflexion    Ankle plantarflexion    Ankle inversion    Ankle eversion     (Blank rows = not tested)  LOWER EXTREMITY MMT:  TBA. 07/03/24-WFLs  MMT Right eval Left eval  Hip flexion    Hip extension    Hip abduction    Hip adduction    Hip internal rotation    Hip external rotation    Knee flexion    Knee extension    Ankle dorsiflexion    Ankle plantarflexion    Ankle inversion    Ankle eversion     (Blank rows = not tested)  LUMBAR SPECIAL TESTS: TBA. 07/03/24 NT  FUNCTIONAL TESTS: 5xSTS- TBA  GAIT: TBA Distance walked: 200' Assistive device utilized: SPC Level of assistance: Mod ind Comments: Decreased pace  TREATMENT DATE:  [facility] Adult PT Treatment:                                                DATE: 07/15/24 Therapeutic Exercise/Therapeutic Act Supine marching  LTR  Gluteal squeeze/PPT Supine dowel chest press and pullover  PPT with ball squeeze  Supine chest press  Supine horiz abdct/addct with cane  Supine ER AAROM with cane  Seated lumbar flexion fwd and lateral c swiss ball Seated red band ER to fatigue Seated horiz abdct RED band  to fatigue  [facility] Adult PT Treatment:                                                DATE: 07/09/24 Therapeutic Exercise/There Act Supine marching  LTR  Gluteal squeeze/PPT Supine dowel chest press and pullover  PPT with ball squeeze  Supine chest press  Supine horiz abdct/addct with cane  Supine ER AAROM with cane  Seated lumbar flexion stretch with laterals Seated red band ER to fatigue Seated horiz abdct RED band to fatigue  [facility] Adult PT Treatment  07/03/2024: Therapeutic Exercise: Seated row - YTB - 2x10 Seated shoulder row - YTB - 3x10 Seated bil ER with YTB - 3x5 Supine chest press with cane Supine flexion ROM w/ cane in relatively comfortable arc Seated Flexion Stretch with Swiss Ball 10 reps - 10 hold Supine March 10 reps Supine Lower Trunk Rotation 10 reps - 5 hold Supine Posterior Pelvic Tilt 10 reps - 3 hold                    HOME EXERCISE PROGRAM: Access Code: [email or web address] URL: [email or web address] Date: 07/03/2024 Prepared by: Dasie Daft  Exercises - Shoulder External Rotation and Scapular Retraction with Resistance  - 1 x daily - 4 x weekly - 2 sets - 10 reps - 3 hold - Standing Shoulder Row with Anchored Resistance  - 1 x daily - 4 x weekly - 2 sets - 10 reps - 3 hold - Shoulder Extension with Resistance  - 1 x daily - 4 x weekly - 2 sets - 10 reps - 3 hold - Supine Shoulder Press AAROM in Abduction with Dowel  - 1 x daily -  4 x weekly - 1 sets - 10 reps - 3 hold - Supine Shoulder Flexion Extension AAROM with Dowel  - 1 x daily - 4 x weekly - 1 sets - 10 reps - 3 hold - Seated Flexion Stretch with Swiss Ball  - 2 x daily - 7 x weekly - 1 sets - 10 reps - 10 hold - Supine March  - 2 x daily - 7 x weekly - 1 sets - 10 reps - Supine Lower Trunk Rotation  - 2 x daily - 7 x weekly - 1 sets - 10 reps - 5 hold - Supine Posterior Pelvic Tilt  - 2 x daily - 7 x weekly - 1 sets - 10 reps - 3 hold  ASSESSMENT: CLINICAL IMPRESSION: Inflated swiss ball for  pt for home use. Pt reports completing her HEP 1x per week. Pt was encouraged to complete at least every other day. PT was completed for lumbopelvic flexibility and strengthening, and for L shoulder strengthening. Pt has switched her diabetes monitor to her L upper arm which is allowing her to sleep on her R side, and this helped her L shoulder pain. Pt tolerated exs in PT today without adverse effects. Pt is responding positively to PT intervention   OBJECTIVE IMPAIRMENTS: decreased activity tolerance, decreased ROM, decreased strength, impaired UE functional use, pain, and high BMI.   ACTIVITY LIMITATIONS: carrying, lifting, bending, standing, squatting, sleeping, stairs, bathing, toileting, dressing, reach over head, hygiene/grooming, locomotion level, and caring for others  PARTICIPATION LIMITATIONS: meal prep, cleaning, laundry, shopping, and community activity  PERSONAL FACTORS: High BMI, Lumbar laminectomy/decompression microdiscectomy; stage 1 lung CA; COPD, OA both hips, DM are also affecting patient's functional outcome.   REHAB POTENTIAL: Good  CLINICAL DECISION MAKING: Evolving/moderate complexity  EVALUATION COMPLEXITY: Moderate   GOALS:  SHORT TERM GOALS: Target date: 07/25/24  Pt will be Ind in an initial HEP  Baseline: Goal status: Ongoing  2.  Pt will report 25% or greater improvement in pain for L shoulder and low back for improved function and QOL Baseline:  07/15/24: L shoulder=65%; low back is about the same Goal status: INITIAL   LONG TERM GOALS: Target date: 08/29/24  Pt will be Ind in a final HEP to maintain achieved LOF  Baseline:  Goal status: INITIAL  2.  Pt will report 75% or greater improvement in pain for L shoulder and 50% for low back for improved function and QOL Baseline:  Goal status: INITIAL  3.  Pt's L shoulder AROM for ER and IR will improve to functional levels approaching levels similar to the R Shoulder Baseline:  Goal status:  INITIAL  4.  Pt will be able to lift 2# OH for functional use of the L UE with daily activities Baseline:  Goal status: INITIAL  5.  Pt's Quick Hollis will improved by the MCID to 49% or better as indication of improved function  Baseline: 64% Goal status: INITIAL  PLAN:  PT FREQUENCY: 2x/week  PT DURATION: 8 weeks  PLANNED INTERVENTIONS: 97164- PT Re-evaluation, 97110-Therapeutic exercises, 97530- Therapeutic activity, 97112- Neuromuscular re-education, 97535- Self Care, [id number]- Manual therapy, [phone]- Aquatic Therapy, [id number]- Electrical stimulation (unattended), [phone]- Ionotophoresis 4mg /ml Dexamethasone , 79439 (1-2 muscles), [id number] (3+ muscles)- Dry Needling, Patient/Family education, Taping, Joint mobilization, Spinal mobilization, Cryotherapy, and Moist heat.  PLAN FOR NEXT SESSION: Eval low back with the next 3 PT appts; assess response to HEP; progress therex as indicated; use of modalities, manual therapy; and TPDN as indicated.  Sidra Oldfield MS, PT 07/15/24 2:13 PM

## 2024-07-15 ENCOUNTER — Ambulatory Visit

## 2024-07-15 DIAGNOSIS — G8929 Other chronic pain: Secondary | ICD-10-CM

## 2024-07-15 DIAGNOSIS — R531 Weakness: Secondary | ICD-10-CM

## 2024-07-15 DIAGNOSIS — M25512 Pain in left shoulder: Secondary | ICD-10-CM

## 2024-07-15 DIAGNOSIS — M6281 Muscle weakness (generalized): Secondary | ICD-10-CM

## 2024-07-16 ENCOUNTER — Encounter: Payer: Self-pay | Admitting: Family Medicine

## 2024-07-16 DIAGNOSIS — G2581 Restless legs syndrome: Secondary | ICD-10-CM

## 2024-07-16 DIAGNOSIS — M48061 Spinal stenosis, lumbar region without neurogenic claudication: Secondary | ICD-10-CM

## 2024-07-16 DIAGNOSIS — M47896 Other spondylosis, lumbar region: Secondary | ICD-10-CM

## 2024-07-16 DIAGNOSIS — M160 Bilateral primary osteoarthritis of hip: Secondary | ICD-10-CM

## 2024-07-16 DIAGNOSIS — G894 Chronic pain syndrome: Secondary | ICD-10-CM

## 2024-07-16 MED ORDER — HYDROCODONE-ACETAMINOPHEN 10-325 MG PO TABS: 1.0000 | ORAL_TABLET | Freq: Four times a day (QID) | ORAL | 0 refills | Status: DC | PRN

## 2024-07-16 MED ORDER — MORPHINE SULFATE ER 15 MG PO TBCR: 15.0000 mg | EXTENDED_RELEASE_TABLET | Freq: Two times a day (BID) | ORAL | 0 refills | Status: DC

## 2024-07-17 ENCOUNTER — Encounter: Payer: Self-pay | Admitting: Physical Therapy

## 2024-07-17 ENCOUNTER — Ambulatory Visit: Admitting: Physical Therapy

## 2024-07-17 DIAGNOSIS — M6281 Muscle weakness (generalized): Secondary | ICD-10-CM

## 2024-07-17 DIAGNOSIS — R531 Weakness: Secondary | ICD-10-CM

## 2024-07-17 DIAGNOSIS — M25512 Pain in left shoulder: Secondary | ICD-10-CM

## 2024-07-17 DIAGNOSIS — G8929 Other chronic pain: Secondary | ICD-10-CM

## 2024-07-17 NOTE — Therapy (Signed)
 OUTPATIENT PHYSICAL THERAPY DAILY NOTE   Patient Name: [name] MRN: [id number] DOB:01/12/1951, 73 y.o., female Today's Date: 07/17/2024  END OF SESSION:  PT End of Session - 07/17/24 1329     Visit Number 6    Number of Visits 17    Date for PT Re-Evaluation 08/29/24    Authorization Type HTA    PT Start Time 1330    PT Stop Time 1412    PT Time Calculation (min) 42 min             Past Medical History:  Diagnosis Date   Adrenal adenoma, left 12/02/2020   CT AP 11/16/20:  left adrenal mass measuring approximately 2.7 cm. This is minimally increased in size from prior study and is consistent with a benign adrenal adenoma.    Adrenal nodule (HCC) 04/08/2011   04/08/11 Abdominal CT showed Left adrenal nodule which is tachnically indeterminate but most likely an adenoma.  It is 1.7 cm and 42 HU on portal venous phase image. 31 HU on delayed images.   01/09/2012 Abdominal CT w/ & w/o CM: Stable benign left adrenal adenoma. No further specific follow-up is needed for this finding.     Allergic rhinitis 01/03/2007   Qualifier: Diagnosis of  By: Kivett, Whitney     Allergy    Anemia    hx of  in 20's   Anxiety    Bipolar disorder (HCC)    Carpal tunnel syndrome 02/14/2007   S/P carpel tunnel release bilaterally.     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 03/28/2018 03/28/2018   COPD WITHOUT EXACERBATION 01/03/2007   Qualifier: Diagnosis of  By: Damien Folks  emphysema   Degenerative joint disease of both hips 05/23/2007   CT AP 11/27/20: Thre are degenerative changes of both hips     Diabetes mellitus without complication (HCC)    A1C 6.5 as of 05-2019- diet changes, no meds currently    DISC WITH RADICULOPATHY 01/03/2007   Qualifier: Diagnosis of  By: Damien Folks     Emphysema of lung (HCC)    Emphysema of Lung on CT 01/10/2024   Esophageal reflux  01/03/2007   Centricity Description: GASTROESOPHAGEAL REFLUX, NO ESOPHAGITIS Qualifier: Diagnosis of  By: Damien Folks   Centricity Description: GERD Qualifier: Diagnosis of  By: Zackary MD, Weston     External hemorrhoids s/p hemorrhoidectomy 03/28/2018 03/28/2018   Facial pain 09/20/2022   Family history of breast cancer    Family history of cancer of female genital organ    Family history of colon cancer    Family history of gene mutation    BRIP1 gene mutation (c.2010dup) in daughter   Family history of lung cancer    Fibromyalgia    Fracture of bone spur of inferior portion of calcaneus 10/06/2018   Functional gastrointestinal disorder 06/18/2008   Functional Gastrointestinal Disorder with frequent eructation and bloating sensation with acute flares.  Responsive to Compazine  and Bentyl .  Takes a few days to calm down.     GERD without esophagitis 01/03/2007   Centricity Description: GASTROESOPHAGEAL REFLUX, NO ESOPHAGITIS Qualifier: Diagnosis of  By: Damien Folks   Centricity Description: GERD Qualifier: Diagnosis of  By: Zackary MD, Weston     Hearing impairment 04/28/2021   Heel spur, fracture, left 04/17/2018   HEMORRHOIDS, NOS 01/03/2007   Qualifier: Diagnosis of  By: Damien Folks  Hepatic steatosis 04/12/2011   Herpes simplex labialis 05/11/2011   Hip osteoarthritis 05/23/2007   MRI Pelvis (03/13/07) Bilateral osteoarthritis of the hips, left greater than right.  The  patient has a slightly more prominent hip effusion on the left than  the right.  No other significant abnormality.     History of MRSA infection 04/28/2011   HYPERCHOLESTEROLEMIA 12/24/2008   Hypertension    HYPERTENSION, BENIGN SYSTEMIC 01/03/2007   Qualifier: Diagnosis of  By: Damien Folks     HYPERTRIGLYCERIDEMIA 11/11/2007   Qualifier: Diagnosis of  By: McDiarmid MD, Todd     Hypokalemia 12/12/2017   Hypothyroidism 02/15/2007   Qualifier: Diagnosis of  By: McDiarmid MD, Krystal BAND, BORDERLINE 02/15/2007   Insomnia 03/03/2021   Intra-abdominal adhesions    Irritable bowel syndrome 01/03/2007   Qualifier: Diagnosis of  By: Damien Folks     Knee pain, acute 12/01/2022   LACTOSE INTOLERANCE 03/10/2008   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   Leukoplakia of oral cavity 09/20/2022   Major depressive disorder, recurrent episode (HCC) 01/03/2007   Qualifier: Diagnosis of  By: Kivett, Whitney     Medication management 12/14/2016   Managing topiramate  for weight loss starting 12/14/16   Memory difficulties 03/04/2021   Metabolic syndrome 02/29/2012   Monoallelic mutation of BRIP1 gene 01/17/2021   c.2010dup (p.[id number]*)   NAFL (nonalcoholic fatty liver) 04/12/2011   Mild hepatic Steatosis on CT 04/08/11 for abdominal pain.      No impairment of memory 04/29/2021   Obesity (BMI 30.0-34.9) 09/30/2007   Has lost 55 lbs since easter. Goal of <190 by 08/05/12.      Obesity, Class III, BMI 40-49.9 (morbid obesity) 09/30/2007        Osteoarthritis of spine 01/03/2007   MR I Lumbar (08/03): GeneralDDD; L2-3 Large  HNP.  Mod pos  hnp - 12/14/2005, smhnpw/irritL4root - 12/14/2005     OSTEOARTHRITIS, HANDS, BILATERAL 06/18/2008   Qualifier: Diagnosis of  By: McDiarmid MD, Krystal BEAL FEMORAL STRESS SYNDROME 01/03/2007   Qualifier: Diagnosis of  By: Damien Folks     Periodic limb movements of sleep 12/05/2010   Diagnosed on Polysomnography testing Fall 2011.      Pneumonia    walking pneumonia in the 90's   Pre-diabetes 12/25/2008   Qualifier: Diagnosis of  By: McDiarmid MD, Todd     Prolapsed internal hemorrhoids, grade 3, s/p ligation/pexy/hemorrhoidectomy 03/28/2018 03/28/2018   Pulmonary nodules/lesions, multiple 03/02/2023   Pyriformis syndrome, right 03/02/2023   RESTLESS LEGS SYNDROME 09/11/2007   Polysomnography (10/2010, Dr Quita Salt, interpreter. ) showed periodic limb  movements that frequently awoke patient from sleep with arousal index of 4 per hour. Dr Francis Dresser (Sleep Specialist) thought her RLS was the major contributor to patient poor sleep condition, more than any sleep apnea.  He recommended considering opamine agonist (either requip or mirapex) to see if this will help.      Rosacea, acne 10/24/2011   SIGMOID POLYP 01/23/2008   Solitary lung nodule 04/08/2011   Stage 1 mild COPD by GOLD classification (HCC) 01/03/2007   11/28/2017 Chest CT: Mild centrilobular and paraseptal emphysema with mild diffuse bronchial wall thickening     Stress incontinence    Synovial cyst of lumbar facet joint 02/17/2016   Trigger thumb of left hand 08/16/2018   Type 2 diabetes mellitus with obesity (HCC) 06/30/2019   IMO SNOMED Dx  Update Oct 2024     Urticaria, chronic 05/22/2011   VITAMIN D  DEFICIENCY 03/03/2010   Work-related condition 05/17/2018   Past Surgical History:  Procedure Laterality Date   APPENDECTOMY     bladder tack  1998   Dr Watt (urology)   BRONCHIAL BIOPSY  03/24/2024   Procedure: BRONCHOSCOPY, WITH BIOPSY;  Surgeon: Shelah Lamar RAMAN, MD;  Location: [facility] ENDOSCOPY;  Service: Pulmonary;;   BRONCHIAL BRUSHINGS  03/24/2024   Procedure: BRONCHOSCOPY, WITH BRUSH BIOPSY;  Surgeon: Shelah Lamar RAMAN, MD;  Location: [facility] ENDOSCOPY;  Service: Pulmonary;;   BRONCHIAL NEEDLE ASPIRATION BIOPSY  03/24/2024   Procedure: BRONCHOSCOPY, WITH NEEDLE ASPIRATION BIOPSY;  Surgeon: Shelah Lamar RAMAN, MD;  Location: [facility] ENDOSCOPY;  Service: Pulmonary;;   BRONCHOSCOPY, WITH BIOPSY USING ELECTROMAGNETIC NAVIGATION Bilateral 03/24/2024   Procedure: BRONCHOSCOPY, WITH BIOPSY USING ELECTROMAGNETIC NAVIGATION;  Surgeon: Shelah Lamar RAMAN, MD;  Location: [facility] ENDOSCOPY;  Service: Pulmonary;  Laterality: Bilateral;  with Fluoro   BUNIONECTOMY  1992   bilateral feet with pins in great toes   CARDIAC CATHETERIZATION     CARPAL TUNNEL RELEASE  2010   Bilateral, Dr Lamar Sypher    COLONOSCOPY  2004   Dr DOROTHA Sous (GI)   CYSTOCELE REPAIR  1998    Dr Evangeline (GYN)-   ESOPHAGOGASTRODUODENOSCOPY     EVALUATION UNDER ANESTHESIA WITH HEMORRHOIDECTOMY N/A 03/28/2018   Procedure: ANORECTAL EXAM UNDER ANESTHESIA lateral internal partial sphincterotomy, hemorrhoidal ligation pixie, HEMORRHOIDECTOMY;  Surgeon: Sheldon Standing, MD;  Location: [facility] ORS;  Service: General;  Laterality: N/A;   EXAM ANORECTAL W/ ULTRASOUND     Dr. Sheldon 03-28-18    EXPLORATORY LAPAROTOMY     FACIAL LACERATIONS REPAIR     Facial Laceration repair as adolescent    FUDUCIAL PLACEMENT  03/24/2024   Procedure: INSERTION, FIDUCIAL MARKER, GOLD;  Surgeon: Shelah Lamar RAMAN, MD;  Location: [facility] ENDOSCOPY;  Service: Pulmonary;;   INJECTION HIP INTRA ARTICULAR  2008   Left Hip fluoroscopically-guided corticosteorid injection for painful osteoarthritis with good effect (06/2007)   LAPAROSCOPIC LYSIS OF ADHESIONS  05/19/2021   Procedure: LAPAROSCOPIC LYSIS OF ADHESIONS; BIOPSY ABDOMINAL SKIN LESION;  Surgeon: Viktoria Comer SAUNDERS, MD;  Location: [facility];  Service: Gynecology;;   LAPAROSCOPY FOR ECTOPIC PREGNANCY     LUMBAR LAMINECTOMY/DECOMPRESSION MICRODISCECTOMY N/A 06/21/2016   Procedure: MICRO LUMBAR DECOMPRESSION L4-L5 AND REMOVAL OF SYNOVIAL CYST    1 LEVEL;  Surgeon: Reyes Billing, MD;  Location: [facility];  Service: Orthopedics;  Laterality: N/A;   MRSA     NM MYOVIEW  LTD  2011   Dr Victory Sharps, III (Card)   RECTOCELE REPAIR  1998   Dr Evangeline (GYN)   ROBOTIC ASSISTED SALPINGO OOPHERECTOMY N/A 05/19/2021   Procedure: XI ROBOTIC ASSISTED LEFT SALPINGO- OOPHORECTOMY WITH PELVIC WASHINGS;  Surgeon: Viktoria Comer SAUNDERS, MD;  Location: [facility];  Service: Gynecology;  Laterality: N/A;   SPHINCTEROTOMY N/A 03/28/2018   Procedure: ANAL SPHINCTEROTOMY;  Surgeon: Sheldon Standing, MD;  Location: [facility];  Service: General;  Laterality: N/A;   UPPER GASTROINTESTINAL ENDOSCOPY     Patient Active Problem List   Diagnosis Date Noted    Right shoulder pain 06/13/2024   Non-small cell cancer of lower lobe of lung (HCC) 04/09/2024   Malignant adenocarcinoma of unspecified part of unspecified bronchus or lung (HCC) 04/06/2024   Episodic tension type headache 02/29/2024   Folliculitis, left shin 02/29/2024   Emphysema of Lung on CT 01/10/2024   Pulmonary nodule 03/02/2023   Polypharmacy 04/29/2021  Adrenal adenoma, left 12/02/2020   Arteriosclerosis of abdominal aorta (HCC) 12/02/2020   Urinary incontinence, mixed 04/02/2020   Type 2 diabetes mellitus without complications (HCC) 05/22/2019   Coronary artery calcification seen on CT scan 12/03/2017   Spinal stenosis of lumbar region 06/21/2016   Encounter for chronic pain management 12/03/2014   Knee pain, bilateral 11/25/2013   Metabolic dysfunction-associated steatohepatitis (MASH) 04/12/2011    Class: Chronic   Vitamin D  deficiency 03/03/2010   History of tobacco abuse 11/10/2009   Hyperlipidemia associated with type 2 diabetes mellitus (HCC) 12/24/2008   Chronic pain syndrome 12/24/2008   Functional gastrointestinal disorder 06/18/2008   HYPERTRIGLYCERIDEMIA 11/11/2007   Obesity, Class III, BMI 40-49.9 (morbid obesity) 09/30/2007   RESTLESS LEGS SYNDROME 09/11/2007   Fibromyalgia syndrome 07/15/2007   Hypothyroidism 02/15/2007   Bipolar 2 disorder (HCC) 01/03/2007   Hypertension associated with diabetes (HCC) 01/03/2007   Allergic rhinitis 01/03/2007   GERD without esophagitis 01/03/2007   Irritable bowel syndrome 01/03/2007   Osteoarthritis of spine 01/03/2007    PCP: McDiarmid, Krystal BIRCH, MD   REFERRING PROVIDER: McDiarmid, Krystal BIRCH, MD   REFERRING DIAG:  M25.512 (ICD-10-CM) - Left shoulder pain, unspecified chronicity  M54.50,G89.29 (ICD-10-CM) - Chronic right-sided low back pain without sciatica  M43.06 (ICD-10-CM) - Lumbar spondylolysis    Rationale for Evaluation and Treatment: Rehabilitation  THERAPY DIAG:  Acute pain of left shoulder  Weakness  generalized  Chronic bilateral low back pain with right-sided sciatica  Muscle weakness (generalized)  ONSET DATE: L shoulder 2 weeks ago; chronic for low back pain SUBJECTIVE:                                                                                                                                                                                           SUBJECTIVE STATEMENT: 07/17/2024:  Pt reports that her L shoulder is feeling much better.  Her sciatica sxs are improved her LBP is about the same.  EVAL: Pt requested today's eval be directed for her L shoulder. Pt states she was lifting a 20# delivery from the ground which she believes injured her L shoulder. The pain did not start bothering her until the next day. Pt feel the L shoulder is getting better. She states having a chronic Hx of low back pain with surgery in 2017.  L handed  PERTINENT HISTORY:  High BMI, Lumbar laminectomy/decompression microdiscectomy; stage 1 lung CA; COPD, OA both hips, DM  PAIN:  Are you having pain? Yes: NPRS scale: 07/17/2024 2/10.  Pain location: L GH area Pain description: Ache, throb Aggravating factors: Sleeping on it, reaching across body and reaching [facility] Relieving  factors: Salonpas patches, heating pad  Yes: NPRS scale: 0/10 in sitting, 9/10 in standing. 07/17/2024 5-6/10  Pain location: Low back and R LE Pain description: sharp Aggravating factors: standing, walking Relieving factors: Sitting  PRECAUTIONS: None  RED FLAGS: None   WEIGHT BEARING RESTRICTIONS: No  FALLS:  Has patient fallen in last 6 months? No  LIVING ENVIRONMENT: Lives with: lives with their family Lives in: House/apartment Able to access home. Level entry  OCCUPATION: Retired  PLOF: Independent with household mobility without device and Independent with community mobility with device  PATIENT GOALS: Less pain and return to normal use  NEXT MD VISIT: 3 months  OBJECTIVE:  Note: Objective measures  were completed at Evaluation unless otherwise noted.  DIAGNOSTIC FINDINGS:  See Epic, none for the shoulder and lumbar in 2017  PATIENT SURVEYS:  Quick Dash: 64%   Minimally Clinically Important Difference (MCID): 15-20 points  (Franchignoni, F. et al. (2013). Minimally clinically important difference of the disabilities of the arm, shoulder, and hand outcome measures (DASH) and its shortened version (Quick DASH). Journal of Orthopaedic & Sports Physical Therapy, 44(1), 30-39)   COGNITION: Overall cognitive status: Within functional limits for tasks assessed     SENSATION: WFL  MUSCLE LENGTH: Hamstrings: Right TBA deg; Left TBA deg Debby test: Right TBA deg; Left TBA deg  POSTURE: rounded shoulders and forward head; Increased lordosis  PALPATION: TTP to the posterior/lateral GH area  UPPER EXTREMITY ROM:   Active ROM Right eval Left eval  Shoulder flexion 130 130 pain at end range  Shoulder extension    Shoulder abduction    Shoulder adduction    Shoulder internal rotation T10 L5; pain at end range  Shoulder external rotation T3 Back of head; pain at end range  Elbow flexion    Elbow extension    Wrist flexion    Wrist extension    Wrist ulnar deviation    Wrist radial deviation    Wrist pronation    Wrist supination    (Blank rows = not tested)  UPPER EXTREMITY MMT:  MMT Right eval Left eval  Shoulder flexion 5 4+ p  Shoulder extension    Shoulder abduction 5 4+ p  Shoulder adduction    Shoulder internal rotation 5 4+ p  Shoulder external rotation 5 4+ p  Middle trapezius    Lower trapezius    Elbow flexion    Elbow extension    Wrist flexion    Wrist extension    Wrist ulnar deviation    Wrist radial deviation    Wrist pronation    Wrist supination    Grip strength (lbs)    (Blank rows = not tested)  SHOULDER SPECIAL TESTS: Impingement tests: Hawkins/Kennedy impingement test: negative SLAP lesions: Clunk test: negative Instability tests:  NT Rotator cuff assessment: Empty can test: negative, Full can test: negative, and Hornblower's sign: negativeAll tets reproduced pain, but were not overtly weak  Biceps assessment: Yergason's test: negative and Speed's test: negative  JOINT MOBILITY TESTING:  WNLs  LUMBAR ROM: TBA. 07/03/24  AROM eval  Flexion Min limited;lordosis remains with forward bending  Extension Min limited  Right lateral flexion Min limited  Left lateral flexion Min limited  Right rotation Min limited  Left rotation Min limited   (Blank rows = not tested)  LOWER EXTREMITY ROM:  TBA. 07/03/24-WFLs  Active  Right eval Left eval  Hip flexion    Hip extension    Hip abduction    Hip adduction  Hip internal rotation    Hip external rotation    Knee flexion    Knee extension    Ankle dorsiflexion    Ankle plantarflexion    Ankle inversion    Ankle eversion     (Blank rows = not tested)  LOWER EXTREMITY MMT:  TBA. 07/03/24-WFLs  MMT Right eval Left eval  Hip flexion    Hip extension    Hip abduction    Hip adduction    Hip internal rotation    Hip external rotation    Knee flexion    Knee extension    Ankle dorsiflexion    Ankle plantarflexion    Ankle inversion    Ankle eversion     (Blank rows = not tested)  LUMBAR SPECIAL TESTS: TBA. 07/03/24 NT  FUNCTIONAL TESTS: 5xSTS- TBA  GAIT: TBA Distance walked: 200' Assistive device utilized: SPC Level of assistance: Mod ind Comments: Decreased pace  TREATMENT DATE:   [facility] Adult PT Treatment  07/17/2024:  Therapeutic Exercise: Seated clam - blue TB - 2x15 Bil ER with YTB - 2x10 Seated horiz abd - YTB - 2x10 Seated hip adduction - 2x10 LAQ - x10 ea - 4# - some L knee pain HS curl - Blue TB - 2x10 Biceps curl - 3# - 4x5 90-90 OH press - 0# - 2x10 nu-step L3 8 min for exercise tolerance     [facility] Adult PT Treatment:                                                DATE: 07/09/24 Therapeutic Exercise/There Act Supine marching   LTR  Gluteal squeeze/PPT Supine dowel chest press and pullover  PPT with ball squeeze  Supine chest press  Supine horiz abdct/addct with cane  Supine ER AAROM with cane  Seated lumbar flexion stretch with laterals Seated red band ER to fatigue Seated horiz abdct RED band to fatigue  [facility] Adult PT Treatment  07/03/2024: Therapeutic Exercise: Seated row - YTB - 2x10 Seated shoulder row - YTB - 3x10 Seated bil ER with YTB - 3x5 Supine chest press with cane Supine flexion ROM w/ cane in relatively comfortable arc Seated Flexion Stretch with Swiss Ball 10 reps - 10 hold Supine March 10 reps Supine Lower Trunk Rotation 10 reps - 5 hold Supine Posterior Pelvic Tilt 10 reps - 3 hold                    HOME EXERCISE PROGRAM: Access Code: [email or web address] URL: [email or web address] Date: 07/03/2024 Prepared by: Dasie Daft  Exercises - Shoulder External Rotation and Scapular Retraction with Resistance  - 1 x daily - 4 x weekly - 2 sets - 10 reps - 3 hold - Standing Shoulder Row with Anchored Resistance  - 1 x daily - 4 x weekly - 2 sets - 10 reps - 3 hold - Shoulder Extension with Resistance  - 1 x daily - 4 x weekly - 2 sets - 10 reps - 3 hold - Supine Shoulder Press AAROM in Abduction with Dowel  - 1 x daily - 4 x weekly - 1 sets - 10 reps - 3 hold - Supine Shoulder Flexion Extension AAROM with Dowel  - 1 x daily - 4 x weekly - 1 sets - 10 reps - 3 hold - Seated Flexion Stretch with Swiss [employer]  -  2 x daily - 7 x weekly - 1 sets - 10 reps - 10 hold - Supine March  - 2 x daily - 7 x weekly - 1 sets - 10 reps - Supine Lower Trunk Rotation  - 2 x daily - 7 x weekly - 1 sets - 10 reps - 5 hold - Supine Posterior Pelvic Tilt  - 2 x daily - 7 x weekly - 1 sets - 10 reps - 3 hold  ASSESSMENT: CLINICAL IMPRESSION: Pt reports improved HEP compliance.  Worked on combination of L shoulder and hip/core strengthening which was tolerated well overall.  OC knee ext did increase L knee  discomfort and was d/c'd.  OBJECTIVE IMPAIRMENTS: decreased activity tolerance, decreased ROM, decreased strength, impaired UE functional use, pain, and high BMI.   ACTIVITY LIMITATIONS: carrying, lifting, bending, standing, squatting, sleeping, stairs, bathing, toileting, dressing, reach over head, hygiene/grooming, locomotion level, and caring for others  PARTICIPATION LIMITATIONS: meal prep, cleaning, laundry, shopping, and community activity  PERSONAL FACTORS: High BMI, Lumbar laminectomy/decompression microdiscectomy; stage 1 lung CA; COPD, OA both hips, DM are also affecting patient's functional outcome.   REHAB POTENTIAL: Good  CLINICAL DECISION MAKING: Evolving/moderate complexity  EVALUATION COMPLEXITY: Moderate   GOALS:  SHORT TERM GOALS: Target date: 07/25/24  Pt will be Ind in an initial HEP  Baseline: Goal status: Ongoing  2.  Pt will report 25% or greater improvement in pain for L shoulder and low back for improved function and QOL Baseline:  07/15/24: L shoulder=65%; low back is about the same Goal status: INITIAL   LONG TERM GOALS: Target date: 08/29/24  Pt will be Ind in a final HEP to maintain achieved LOF  Baseline:  Goal status: INITIAL  2.  Pt will report 75% or greater improvement in pain for L shoulder and 50% for low back for improved function and QOL Baseline:  Goal status: INITIAL  3.  Pt's L shoulder AROM for ER and IR will improve to functional levels approaching levels similar to the R Shoulder Baseline:  Goal status: INITIAL  4.  Pt will be able to lift 2# OH for functional use of the L UE with daily activities Baseline:  Goal status: INITIAL  5.  Pt's Quick Hollis will improved by the MCID to 49% or better as indication of improved function  Baseline: 64% Goal status: INITIAL  PLAN:  PT FREQUENCY: 2x/week  PT DURATION: 8 weeks  PLANNED INTERVENTIONS: 97164- PT Re-evaluation, 97110-Therapeutic exercises, 97530- Therapeutic activity,  97112- Neuromuscular re-education, 97535- Self Care, [id number]- Manual therapy, [phone]- Aquatic Therapy, [id number]- Electrical stimulation (unattended), [phone]- Ionotophoresis 4mg /ml Dexamethasone , 79439 (1-2 muscles), [phone] (3+ muscles)- Dry Needling, Patient/Family education, Taping, Joint mobilization, Spinal mobilization, Cryotherapy, and Moist heat.  PLAN FOR NEXT SESSION: Eval low back with the next 3 PT appts; assess response to HEP; progress therex as indicated; use of modalities, manual therapy; and TPDN as indicated.   Lynnett Langlinais E Trinitee Horgan PT 07/17/24 2:13 PM

## 2024-07-22 ENCOUNTER — Ambulatory Visit: Admitting: Physical Therapy

## 2024-07-24 ENCOUNTER — Encounter: Payer: Self-pay | Admitting: Physical Therapy

## 2024-07-24 ENCOUNTER — Encounter: Payer: Self-pay | Admitting: Family Medicine

## 2024-07-24 ENCOUNTER — Ambulatory Visit: Admitting: Physical Therapy

## 2024-07-24 DIAGNOSIS — M25512 Pain in left shoulder: Secondary | ICD-10-CM | POA: Diagnosis not present

## 2024-07-24 DIAGNOSIS — G8929 Other chronic pain: Secondary | ICD-10-CM

## 2024-07-24 DIAGNOSIS — M6281 Muscle weakness (generalized): Secondary | ICD-10-CM

## 2024-07-24 DIAGNOSIS — R531 Weakness: Secondary | ICD-10-CM

## 2024-07-24 NOTE — Therapy (Addendum)
 " PHYSICAL THERAPY UNPLANNED DISCHARGE SUMMARY   Visits from Start of Care: 7  Current functional level related to goals / functional outcomes: Current status unknown   Remaining deficits: Current status unknown   Education / Equipment: Pt has not returned since visit listed below  Patient goals were not assessed. Patient is being discharged due to not returning since the last visit.  (the note below was addended to include the above D/C summary on 11/03/2024)  OUTPATIENT PHYSICAL THERAPY DAILY NOTE   Patient Name: [name] MRN: [id number] DOB:[birth year], 73 y.o., female Today's Date: 07/24/2024  END OF SESSION:  PT End of Session - 07/24/24 1326     Visit Number 7    Number of Visits 17    Date for Recertification  08/29/24    Authorization Type HTA    PT Start Time 1330    PT Stop Time 1411    PT Time Calculation (min) 41 min             Past Medical History:  Diagnosis Date   Adrenal adenoma, left 12/02/2020   CT AP 11/16/20:  left adrenal mass measuring approximately 2.7 cm. This is minimally increased in size from prior study and is consistent with a benign adrenal adenoma.    Adrenal nodule (HCC) 04/08/2011   04/08/11 Abdominal CT showed Left adrenal nodule which is tachnically indeterminate but most likely an adenoma.  It is 1.7 cm and 42 HU on portal venous phase image. 31 HU on delayed images.   01/09/2012 Abdominal CT w/ & w/o CM: Stable benign left adrenal adenoma. No further specific follow-up is needed for this finding.     Allergic rhinitis 01/03/2007   Qualifier: Diagnosis of  By: Kivett, Whitney     Allergy    Anemia    hx of  in 20's   Anxiety    Bipolar disorder (HCC)    Carpal tunnel syndrome 02/14/2007   S/P carpel tunnel release bilaterally.     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 03/28/2018 03/28/2018   COPD WITHOUT  EXACERBATION 01/03/2007   Qualifier: Diagnosis of  By: Damien Folks  emphysema   Degenerative joint disease of both hips 05/23/2007   CT AP 11/27/20: Thre are degenerative changes of both hips     Diabetes mellitus without complication (HCC)    A1C 6.5 as of 05-2019- diet changes, no meds currently    DISC WITH RADICULOPATHY 01/03/2007   Qualifier: Diagnosis of  By: Damien Folks     Emphysema of lung (HCC)    Emphysema of Lung on CT 01/10/2024   Esophageal reflux 01/03/2007   Centricity Description: GASTROESOPHAGEAL REFLUX, NO ESOPHAGITIS Qualifier: Diagnosis of  By: Damien Folks   Centricity Description: GERD Qualifier: Diagnosis of  By: Zackary MD, Weston     External hemorrhoids s/p hemorrhoidectomy 03/28/2018 03/28/2018   Facial pain 09/20/2022   Family history of breast cancer    Family history of cancer of female genital organ    Family history of colon cancer    Family history of gene mutation    BRIP1 gene mutation (c.2010dup) in daughter   Family history of lung cancer    Fibromyalgia    Fracture of bone spur of inferior portion of calcaneus 10/06/2018   Functional gastrointestinal disorder 06/18/2008   Functional Gastrointestinal Disorder with frequent eructation and bloating sensation with acute  flares.  Responsive to Compazine  and Bentyl .  Takes a few days to calm down.     GERD without esophagitis 01/03/2007   Centricity Description: GASTROESOPHAGEAL REFLUX, NO ESOPHAGITIS Qualifier: Diagnosis of  By: Damien Folks   Centricity Description: GERD Qualifier: Diagnosis of  By: Zackary MD, Weston     Hearing impairment 04/28/2021   Heel spur, fracture, left 04/17/2018   HEMORRHOIDS, NOS 01/03/2007   Qualifier: Diagnosis of  By: Damien Folks     Hepatic steatosis 04/12/2011   Herpes simplex labialis 05/11/2011   Hip osteoarthritis 05/23/2007   MRI Pelvis (03/13/07) Bilateral osteoarthritis of the hips, left greater than right.  The  patient has a slightly more  prominent hip effusion on the left than  the right.  No other significant abnormality.     History of MRSA infection 04/28/2011   HYPERCHOLESTEROLEMIA 12/24/2008   Hypertension    HYPERTENSION, BENIGN SYSTEMIC 01/03/2007   Qualifier: Diagnosis of  By: Damien Folks     HYPERTRIGLYCERIDEMIA 11/11/2007   Qualifier: Diagnosis of  By: McDiarmid MD, Todd     Hypokalemia 12/12/2017   Hypothyroidism 02/15/2007   Qualifier: Diagnosis of  By: McDiarmid MD, Krystal BAND, BORDERLINE 02/15/2007   Insomnia 03/03/2021   Intra-abdominal adhesions    Irritable bowel syndrome 01/03/2007   Qualifier: Diagnosis of  By: Damien Folks     Knee pain, acute 12/01/2022   LACTOSE INTOLERANCE 03/10/2008   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   Leukoplakia of oral cavity 09/20/2022   Major depressive disorder, recurrent episode (HCC) 01/03/2007   Qualifier: Diagnosis of  By: Kivett, Whitney     Medication management 12/14/2016   Managing topiramate  for weight loss starting 12/14/16   Memory difficulties 03/04/2021   Metabolic syndrome 02/29/2012   Monoallelic mutation of BRIP1 gene 01/17/2021   c.2010dup (p.[id number]*)   NAFL (nonalcoholic fatty liver) 04/12/2011   Mild hepatic Steatosis on CT 04/08/11 for abdominal pain.      No impairment of memory 04/29/2021   Obesity (BMI 30.0-34.9) 09/30/2007   Has lost 55 lbs since easter. Goal of <190 by 08/05/12.      Obesity, Class III, BMI 40-49.9 (morbid obesity) 09/30/2007        Osteoarthritis of spine 01/03/2007   MR I Lumbar (08/03): GeneralDDD; L2-3 Large  HNP.  Mod pos  hnp - 12/14/2005, smhnpw/irritL4root - 12/14/2005     OSTEOARTHRITIS, HANDS, BILATERAL 06/18/2008   Qualifier: Diagnosis of  By: McDiarmid MD, Krystal BEAL FEMORAL STRESS SYNDROME 01/03/2007   Qualifier: Diagnosis of  By: Damien Folks     Periodic limb movements of sleep 12/05/2010   Diagnosed on  Polysomnography testing Fall 2011.      Pneumonia    walking pneumonia in the 90's   Pre-diabetes 12/25/2008   Qualifier: Diagnosis of  By: McDiarmid MD, Todd     Prolapsed internal hemorrhoids, grade 3, s/p ligation/pexy/hemorrhoidectomy 03/28/2018 03/28/2018   Pulmonary nodules/lesions, multiple 03/02/2023   Pyriformis syndrome, right 03/02/2023   RESTLESS LEGS SYNDROME 09/11/2007   Polysomnography (10/2010, Dr Quita Salt, interpreter. ) showed periodic limb movements that frequently awoke patient from sleep with arousal index of 4 per hour. Dr Francis Dresser (Sleep Specialist) thought her RLS was the major contributor to patient poor sleep condition, more than any sleep apnea.  He recommended considering opamine agonist (either requip or mirapex) to see if this will help.  Rosacea, acne 10/24/2011   SIGMOID POLYP 01/23/2008   Solitary lung nodule 04/08/2011   Stage 1 mild COPD by GOLD classification (HCC) 01/03/2007   11/28/2017 Chest CT: Mild centrilobular and paraseptal emphysema with mild diffuse bronchial wall thickening     Stress incontinence    Synovial cyst of lumbar facet joint 02/17/2016   Trigger thumb of left hand 08/16/2018   Type 2 diabetes mellitus with obesity (HCC) 06/30/2019   IMO SNOMED Dx Update Oct 2024     Urticaria, chronic 05/22/2011   VITAMIN D  DEFICIENCY 03/03/2010   Work-related condition 05/17/2018   Past Surgical History:  Procedure Laterality Date   APPENDECTOMY     bladder tack  1998   Dr Watt (urology)   BRONCHIAL BIOPSY  03/24/2024   Procedure: BRONCHOSCOPY, WITH BIOPSY;  Surgeon: Shelah Lamar RAMAN, MD;  Location: [facility] ENDOSCOPY;  Service: Pulmonary;;   BRONCHIAL BRUSHINGS  03/24/2024   Procedure: BRONCHOSCOPY, WITH BRUSH BIOPSY;  Surgeon: Shelah Lamar RAMAN, MD;  Location: [facility] ENDOSCOPY;  Service: Pulmonary;;   BRONCHIAL NEEDLE ASPIRATION BIOPSY  03/24/2024   Procedure: BRONCHOSCOPY, WITH NEEDLE ASPIRATION BIOPSY;  Surgeon: Shelah Lamar RAMAN, MD;  Location:  [facility] ENDOSCOPY;  Service: Pulmonary;;   BRONCHOSCOPY, WITH BIOPSY USING ELECTROMAGNETIC NAVIGATION Bilateral 03/24/2024   Procedure: BRONCHOSCOPY, WITH BIOPSY USING ELECTROMAGNETIC NAVIGATION;  Surgeon: Shelah Lamar RAMAN, MD;  Location: [facility] ENDOSCOPY;  Service: Pulmonary;  Laterality: Bilateral;  with Fluoro   BUNIONECTOMY  1992   bilateral feet with pins in great toes   CARDIAC CATHETERIZATION     CARPAL TUNNEL RELEASE  2010   Bilateral, Dr Lamar Sypher   COLONOSCOPY  2004   Dr DOROTHA Sous (GI)   CYSTOCELE REPAIR  1998    Dr Evangeline (GYN)-   ESOPHAGOGASTRODUODENOSCOPY     EVALUATION UNDER ANESTHESIA WITH HEMORRHOIDECTOMY N/A 03/28/2018   Procedure: ANORECTAL EXAM UNDER ANESTHESIA lateral internal partial sphincterotomy, hemorrhoidal ligation pixie, HEMORRHOIDECTOMY;  Surgeon: Sheldon Standing, MD;  Location: [facility] ORS;  Service: General;  Laterality: N/A;   EXAM ANORECTAL W/ ULTRASOUND     Dr. Sheldon 03-28-18    EXPLORATORY LAPAROTOMY     FACIAL LACERATIONS REPAIR     Facial Laceration repair as adolescent    FUDUCIAL PLACEMENT  03/24/2024   Procedure: INSERTION, FIDUCIAL MARKER, GOLD;  Surgeon: Shelah Lamar RAMAN, MD;  Location: [facility] ENDOSCOPY;  Service: Pulmonary;;   INJECTION HIP INTRA ARTICULAR  2008   Left Hip fluoroscopically-guided corticosteorid injection for painful osteoarthritis with good effect (06/2007)   LAPAROSCOPIC LYSIS OF ADHESIONS  05/19/2021   Procedure: LAPAROSCOPIC LYSIS OF ADHESIONS; BIOPSY ABDOMINAL SKIN LESION;  Surgeon: Viktoria Comer SAUNDERS, MD;  Location: [facility] ORS;  Service: Gynecology;;   LAPAROSCOPY FOR ECTOPIC PREGNANCY     LUMBAR LAMINECTOMY/DECOMPRESSION MICRODISCECTOMY N/A 06/21/2016   Procedure: MICRO LUMBAR DECOMPRESSION L4-L5 AND REMOVAL OF SYNOVIAL CYST    1 LEVEL;  Surgeon: Reyes Billing, MD;  Location: [facility] ORS;  Service: Orthopedics;  Laterality: N/A;   MRSA     NM MYOVIEW  LTD  2011   Dr Victory Sharps, III (Card)   RECTOCELE REPAIR  1998   Dr Evangeline (GYN)   ROBOTIC ASSISTED  SALPINGO OOPHERECTOMY N/A 05/19/2021   Procedure: XI ROBOTIC ASSISTED LEFT SALPINGO- OOPHORECTOMY WITH PELVIC WASHINGS;  Surgeon: Viktoria Comer SAUNDERS, MD;  Location: [facility] ORS;  Service: Gynecology;  Laterality: N/A;   SPHINCTEROTOMY N/A 03/28/2018   Procedure: ANAL SPHINCTEROTOMY;  Surgeon: Sheldon Standing, MD;  Location: [facility];  Service: General;  Laterality: N/A;  UPPER GASTROINTESTINAL ENDOSCOPY     Patient Active Problem List   Diagnosis Date Noted   Right shoulder pain 06/13/2024   Non-small cell cancer of lower lobe of lung (HCC) 04/09/2024   Malignant adenocarcinoma of unspecified part of unspecified bronchus or lung (HCC) 04/06/2024   Episodic tension type headache 02/29/2024   Folliculitis, left shin 02/29/2024   Emphysema of Lung on CT 01/10/2024   Pulmonary nodule 03/02/2023   Polypharmacy 04/29/2021   Adrenal adenoma, left 12/02/2020   Arteriosclerosis of abdominal aorta (HCC) 12/02/2020   Urinary incontinence, mixed 04/02/2020   Type 2 diabetes mellitus without complications (HCC) 05/22/2019   Coronary artery calcification seen on CT scan 12/03/2017   Spinal stenosis of lumbar region 06/21/2016   Encounter for chronic pain management 12/03/2014   Knee pain, bilateral 11/25/2013   Metabolic dysfunction-associated steatohepatitis (MASH) 04/12/2011    Class: Chronic   Vitamin D  deficiency 03/03/2010   History of tobacco abuse 11/10/2009   Hyperlipidemia associated with type 2 diabetes mellitus (HCC) 12/24/2008   Chronic pain syndrome 12/24/2008   Functional gastrointestinal disorder 06/18/2008   HYPERTRIGLYCERIDEMIA 11/11/2007   Obesity, Class III, BMI 40-49.9 (morbid obesity) 09/30/2007   RESTLESS LEGS SYNDROME 09/11/2007   Fibromyalgia syndrome 07/15/2007   Hypothyroidism 02/15/2007   Bipolar 2 disorder (HCC) 01/03/2007   Hypertension associated with diabetes (HCC) 01/03/2007   Allergic rhinitis 01/03/2007   GERD without esophagitis 01/03/2007   Irritable bowel  syndrome 01/03/2007   Osteoarthritis of spine 01/03/2007    PCP: McDiarmid, Krystal BIRCH, MD   REFERRING PROVIDER: McDiarmid, Krystal BIRCH, MD   REFERRING DIAG:  M25.512 (ICD-10-CM) - Left shoulder pain, unspecified chronicity  M54.50,G89.29 (ICD-10-CM) - Chronic right-sided low back pain without sciatica  M43.06 (ICD-10-CM) - Lumbar spondylolysis    Rationale for Evaluation and Treatment: Rehabilitation  THERAPY DIAG:  Acute pain of left shoulder  Weakness generalized  Chronic bilateral low back pain with right-sided sciatica  Muscle weakness (generalized)  ONSET DATE: L shoulder 2 weeks ago; chronic for low back pain SUBJECTIVE:                                                                                                                                                                                           SUBJECTIVE STATEMENT: 07/24/2024:  Pt reports that she she is having increased L shoulder pain recently.  EVAL: Pt requested today's eval be directed for her L shoulder. Pt states she was lifting a 20# delivery from the ground which she believes injured her L shoulder. The pain did not start bothering her until the next day. Pt feel the L shoulder is getting  better. She states having a chronic Hx of low back pain with surgery in 2017.  L handed  PERTINENT HISTORY:  High BMI, Lumbar laminectomy/decompression microdiscectomy; stage 1 lung CA; COPD, OA both hips, DM  PAIN:  Are you having pain? Yes: NPRS scale: 07/24/2024 2/10.  Pain location: L GH area Pain description: Ache, throb Aggravating factors: Sleeping on it, reaching across body and reaching OH Relieving factors: Salonpas patches, heating pad  Yes: NPRS scale: 0/10 in sitting, 9/10 in standing. 07/24/2024 5-6/10  Pain location: Low back and R LE Pain description: sharp Aggravating factors: standing, walking Relieving factors: Sitting  PRECAUTIONS: None  RED FLAGS: None   WEIGHT BEARING RESTRICTIONS:  No  FALLS:  Has patient fallen in last 6 months? No  LIVING ENVIRONMENT: Lives with: lives with their family Lives in: House/apartment Able to access home. Level entry  OCCUPATION: Retired  PLOF: Independent with household mobility without device and Independent with community mobility with device  PATIENT GOALS: Less pain and return to normal use  NEXT MD VISIT: 3 months  OBJECTIVE:  Note: Objective measures were completed at Evaluation unless otherwise noted.  DIAGNOSTIC FINDINGS:  See Epic, none for the shoulder and lumbar in 2017  PATIENT SURVEYS:  Quick Dash: 64%   Minimally Clinically Important Difference (MCID): 15-20 points  (Franchignoni, F. et al. (2013). Minimally clinically important difference of the disabilities of the arm, shoulder, and hand outcome measures (DASH) and its shortened version (Quick DASH). Journal of Orthopaedic & Sports Physical Therapy, 44(1), 30-39)   COGNITION: Overall cognitive status: Within functional limits for tasks assessed     SENSATION: WFL  MUSCLE LENGTH: Hamstrings: Right TBA deg; Left TBA deg Debby test: Right TBA deg; Left TBA deg  POSTURE: rounded shoulders and forward head; Increased lordosis  PALPATION: TTP to the posterior/lateral GH area  UPPER EXTREMITY ROM:   Active ROM Right eval Left eval  Shoulder flexion 130 130 pain at end range  Shoulder extension    Shoulder abduction    Shoulder adduction    Shoulder internal rotation T10 L5; pain at end range  Shoulder external rotation T3 Back of head; pain at end range  Elbow flexion    Elbow extension    Wrist flexion    Wrist extension    Wrist ulnar deviation    Wrist radial deviation    Wrist pronation    Wrist supination    (Blank rows = not tested)  UPPER EXTREMITY MMT:  MMT Right eval Left eval  Shoulder flexion 5 4+ p  Shoulder extension    Shoulder abduction 5 4+ p  Shoulder adduction    Shoulder internal rotation 5 4+ p  Shoulder  external rotation 5 4+ p  Middle trapezius    Lower trapezius    Elbow flexion    Elbow extension    Wrist flexion    Wrist extension    Wrist ulnar deviation    Wrist radial deviation    Wrist pronation    Wrist supination    Grip strength (lbs)    (Blank rows = not tested)  SHOULDER SPECIAL TESTS: Impingement tests: Hawkins/Kennedy impingement test: negative SLAP lesions: Clunk test: negative Instability tests: NT Rotator cuff assessment: Empty can test: negative, Full can test: negative, and Hornblower's sign: negativeAll tets reproduced pain, but were not overtly weak  Biceps assessment: Yergason's test: negative and Speed's test: negative  JOINT MOBILITY TESTING:  WNLs  LUMBAR ROM: TBA. 07/03/24  AROM eval  Flexion Min  limited;lordosis remains with forward bending  Extension Min limited  Right lateral flexion Min limited  Left lateral flexion Min limited  Right rotation Min limited  Left rotation Min limited   (Blank rows = not tested)  LOWER EXTREMITY ROM:  TBA. 07/03/24-WFLs  Active  Right eval Left eval  Hip flexion    Hip extension    Hip abduction    Hip adduction    Hip internal rotation    Hip external rotation    Knee flexion    Knee extension    Ankle dorsiflexion    Ankle plantarflexion    Ankle inversion    Ankle eversion     (Blank rows = not tested)  LOWER EXTREMITY MMT:  TBA. 07/03/24-WFLs  MMT Right eval Left eval  Hip flexion    Hip extension    Hip abduction    Hip adduction    Hip internal rotation    Hip external rotation    Knee flexion    Knee extension    Ankle dorsiflexion    Ankle plantarflexion    Ankle inversion    Ankle eversion     (Blank rows = not tested)  LUMBAR SPECIAL TESTS: TBA. 07/03/24 NT  FUNCTIONAL TESTS: 5xSTS- TBA  GAIT: TBA Distance walked: 200' Assistive device utilized: SPC Level of assistance: Mod ind Comments: Decreased pace  TREATMENT DATE:   [facility] Adult PT Treatment   07/24/2024:  Therapeutic Exercise: Seated clam - black TB - 2x15 (not today) Bil ER with YTB - 3x10 Seated Row - Blue TB - 3x10 Seated shoulder ext - GTB - 3x10 Seated horiz abd - YTB - 3x10 Sciatic nerve glide - 2x15 - R LAQ - 2x10 ea - 7# - some L knee pain HS curl - Black TB - 2x10 nu-step L3 8 min for exercise tolerance (not today)  Therapeutic Activity  STS - 2x5 Walking for endurance - 100' x2    [facility] Adult PT Treatment:                                                DATE: 07/09/24 Therapeutic Exercise/There Act Supine marching  LTR  Gluteal squeeze/PPT Supine dowel chest press and pullover  PPT with ball squeeze  Supine chest press  Supine horiz abdct/addct with cane  Supine ER AAROM with cane  Seated lumbar flexion stretch with laterals Seated red band ER to fatigue Seated horiz abdct RED band to fatigue  [facility] Adult PT Treatment  07/03/2024: Therapeutic Exercise: Seated row - YTB - 2x10 Seated shoulder row - YTB - 3x10 Seated bil ER with YTB - 3x5 Supine chest press with cane Supine flexion ROM w/ cane in relatively comfortable arc Seated Flexion Stretch with Swiss Ball 10 reps - 10 hold Supine March 10 reps Supine Lower Trunk Rotation 10 reps - 5 hold Supine Posterior Pelvic Tilt 10 reps - 3 hold                    HOME EXERCISE PROGRAM: Access Code: [email or web address] URL: [email or web address] Date: 07/03/2024 Prepared by: Dasie Daft  Exercises - Shoulder External Rotation and Scapular Retraction with Resistance  - 1 x daily - 4 x weekly - 2 sets - 10 reps - 3 hold - Standing Shoulder Row with Anchored Resistance  - 1 x daily - 4 x weekly - 2  sets - 10 reps - 3 hold - Shoulder Extension with Resistance  - 1 x daily - 4 x weekly - 2 sets - 10 reps - 3 hold - Supine Shoulder Press AAROM in Abduction with Dowel  - 1 x daily - 4 x weekly - 1 sets - 10 reps - 3 hold - Supine Shoulder Flexion Extension AAROM with Dowel  - 1 x daily - 4 x weekly - 1 sets  - 10 reps - 3 hold - Seated Flexion Stretch with Swiss Ball  - 2 x daily - 7 x weekly - 1 sets - 10 reps - 10 hold - Supine March  - 2 x daily - 7 x weekly - 1 sets - 10 reps - Supine Lower Trunk Rotation  - 2 x daily - 7 x weekly - 1 sets - 10 reps - 5 hold - Supine Posterior Pelvic Tilt  - 2 x daily - 7 x weekly - 1 sets - 10 reps - 3 hold  ASSESSMENT: CLINICAL IMPRESSION: Pt reports improved HEP compliance.  Worked on building volume of shoulder and LE exercises today to good effect.  Pt reporting mainly fatigue with a few isolated instances of increased pain which were transient.  Started working on walking tolerance with 2 bouts of walking for ~100 ft with a rest break between.  PT highly fatigued with this.  Encouraged her to walk more at home.  OBJECTIVE IMPAIRMENTS: decreased activity tolerance, decreased ROM, decreased strength, impaired UE functional use, pain, and high BMI.   ACTIVITY LIMITATIONS: carrying, lifting, bending, standing, squatting, sleeping, stairs, bathing, toileting, dressing, reach over head, hygiene/grooming, locomotion level, and caring for others  PARTICIPATION LIMITATIONS: meal prep, cleaning, laundry, shopping, and community activity  PERSONAL FACTORS: High BMI, Lumbar laminectomy/decompression microdiscectomy; stage 1 lung CA; COPD, OA both hips, DM are also affecting patient's functional outcome.   REHAB POTENTIAL: Good  CLINICAL DECISION MAKING: Evolving/moderate complexity  EVALUATION COMPLEXITY: Moderate   GOALS:  SHORT TERM GOALS: Target date: 07/25/24  Pt will be Ind in an initial HEP  Baseline: Goal status: Ongoing  2.  Pt will report 25% or greater improvement in pain for L shoulder and low back for improved function and QOL Baseline:  07/15/24: L shoulder=65%; low back is about the same Goal status: ongoing   LONG TERM GOALS: Target date: 08/29/24  Pt will be Ind in a final HEP to maintain achieved LOF  Baseline:  Goal status:  INITIAL  2.  Pt will report 75% or greater improvement in pain for L shoulder and 50% for low back for improved function and QOL Baseline:  Goal status: INITIAL  3.  Pt's L shoulder AROM for ER and IR will improve to functional levels approaching levels similar to the R Shoulder Baseline:  Goal status: INITIAL  4.  Pt will be able to lift 2# OH for functional use of the L UE with daily activities Baseline:  Goal status: INITIAL  5.  Pt's Quick Hollis will improved by the MCID to 49% or better as indication of improved function  Baseline: 64% Goal status: INITIAL  PLAN:  PT FREQUENCY: 2x/week  PT DURATION: 8 weeks  PLANNED INTERVENTIONS: [id number]- PT Re-evaluation, 97110-Therapeutic exercises, 97530- Therapeutic activity, 97112- Neuromuscular re-education, 97535- Self Care, [id number]- Manual therapy, [phone]- Aquatic Therapy, [id number]- Electrical stimulation (unattended), [phone]- Ionotophoresis 4mg /ml Dexamethasone , 79439 (1-2 muscles), [id number] (3+ muscles)- Dry Needling, Patient/Family education, Taping, Joint mobilization, Spinal mobilization, Cryotherapy, and Moist heat.  PLAN FOR NEXT  SESSION: Eval low back with the next 3 PT appts; assess response to HEP; progress therex as indicated; use of modalities, manual therapy; and TPDN as indicated.   Leandrea Ackley E Chett Taniguchi PT 07/24/24 2:40 PM          "

## 2024-07-26 DIAGNOSIS — M25512 Pain in left shoulder: Secondary | ICD-10-CM | POA: Diagnosis not present

## 2024-07-26 DIAGNOSIS — M19012 Primary osteoarthritis, left shoulder: Secondary | ICD-10-CM | POA: Diagnosis not present

## 2024-07-29 DIAGNOSIS — F3181 Bipolar II disorder: Secondary | ICD-10-CM | POA: Diagnosis not present

## 2024-07-29 DIAGNOSIS — F4312 Post-traumatic stress disorder, chronic: Secondary | ICD-10-CM | POA: Diagnosis not present

## 2024-08-01 ENCOUNTER — Ambulatory Visit: Admitting: Physical Therapy

## 2024-08-05 ENCOUNTER — Ambulatory Visit: Admitting: Physical Therapy

## 2024-08-06 ENCOUNTER — Ambulatory Visit (HOSPITAL_COMMUNITY)

## 2024-08-07 ENCOUNTER — Inpatient Hospital Stay: Attending: Internal Medicine

## 2024-08-07 ENCOUNTER — Ambulatory Visit (HOSPITAL_COMMUNITY)
Admission: RE | Admit: 2024-08-07 | Discharge: 2024-08-07 | Disposition: A | Discharge status: Home / Self Care | Source: Ambulatory Visit | Attending: Internal Medicine | Admitting: Internal Medicine

## 2024-08-07 DIAGNOSIS — Z79899 Other long term (current) drug therapy: Secondary | ICD-10-CM | POA: Insufficient documentation

## 2024-08-07 DIAGNOSIS — J439 Emphysema, unspecified: Secondary | ICD-10-CM | POA: Diagnosis not present

## 2024-08-07 DIAGNOSIS — I2510 Atherosclerotic heart disease of native coronary artery without angina pectoris: Secondary | ICD-10-CM | POA: Diagnosis not present

## 2024-08-07 DIAGNOSIS — C3490 Malignant neoplasm of unspecified part of unspecified bronchus or lung: Secondary | ICD-10-CM | POA: Insufficient documentation

## 2024-08-07 DIAGNOSIS — Z87891 Personal history of nicotine dependence: Secondary | ICD-10-CM | POA: Insufficient documentation

## 2024-08-07 DIAGNOSIS — Z923 Personal history of irradiation: Secondary | ICD-10-CM | POA: Diagnosis not present

## 2024-08-07 DIAGNOSIS — R918 Other nonspecific abnormal finding of lung field: Secondary | ICD-10-CM | POA: Diagnosis not present

## 2024-08-07 DIAGNOSIS — C3432 Malignant neoplasm of lower lobe, left bronchus or lung: Secondary | ICD-10-CM | POA: Diagnosis not present

## 2024-08-07 LAB — CMP (CANCER CENTER ONLY)
ALT: 18 U/L (ref 0–44)
AST: 20 U/L (ref 15–41)
Albumin: 4.4 g/dL (ref 3.5–5.0)
Alkaline Phosphatase: 51 U/L (ref 38–126)
Anion gap: 5 (ref 5–15)
BUN: 11 mg/dL (ref 8–23)
CO2: 32 mmol/L (ref 22–32)
Calcium: 9.8 mg/dL (ref 8.9–10.3)
Chloride: 95 mmol/L — ABNORMAL LOW (ref 98–111)
Creatinine: 0.82 mg/dL (ref 0.44–1.00)
GFR, Estimated: >60 mL/min (ref >60)
Glucose, Bld: 139 mg/dL — ABNORMAL HIGH (ref 70–99)
Potassium: 4.4 mmol/L (ref 3.5–5.1)
Sodium: 132 mmol/L — ABNORMAL LOW (ref 135–145)
Total Bilirubin: 0.5 mg/dL (ref 0.0–1.2)
Total Protein: 7.4 g/dL (ref 6.5–8.1)

## 2024-08-07 LAB — CBC WITH DIFFERENTIAL (CANCER CENTER ONLY)
Abs Immature Granulocytes: 0.03 K/uL (ref 0.00–0.07)
Basophils Absolute: 0 K/uL (ref 0.0–0.1)
Basophils Relative: 0 %
Eosinophils Absolute: 0.1 K/uL (ref 0.0–0.5)
Eosinophils Relative: 1 %
HCT: 37.6 % (ref 36.0–46.0)
Hemoglobin: 12.6 g/dL (ref 12.0–15.0)
Immature Granulocytes: 0 %
Lymphocytes Relative: 15 %
Lymphs Abs: 1.4 K/uL (ref 0.7–4.0)
MCH: 31.1 pg (ref 26.0–34.0)
MCHC: 33.5 g/dL (ref 30.0–36.0)
MCV: 92.8 fL (ref 80.0–100.0)
Monocytes Absolute: 1 K/uL (ref 0.1–1.0)
Monocytes Relative: 11 %
Neutro Abs: 6.7 K/uL (ref 1.7–7.7)
Neutrophils Relative %: 73 %
Platelet Count: 230 K/uL (ref 150–400)
RBC: 4.05 MIL/uL (ref 3.87–5.11)
RDW: 12.6 % (ref 11.5–15.5)
WBC Count: 9.2 K/uL (ref 4.0–10.5)
nRBC: 0 % (ref 0.0–0.2)

## 2024-08-07 MED ORDER — IOHEXOL 300 MG/ML  SOLN
75.0000 mL | Freq: Once | INTRAMUSCULAR | Status: AC | PRN
  Administered 2024-08-07: 75 mL via INTRAVENOUS

## 2024-08-08 ENCOUNTER — Ambulatory Visit

## 2024-08-11 ENCOUNTER — Other Ambulatory Visit: Payer: Self-pay

## 2024-08-11 DIAGNOSIS — M47896 Other spondylosis, lumbar region: Secondary | ICD-10-CM

## 2024-08-11 DIAGNOSIS — G2581 Restless legs syndrome: Secondary | ICD-10-CM

## 2024-08-11 DIAGNOSIS — M48061 Spinal stenosis, lumbar region without neurogenic claudication: Secondary | ICD-10-CM

## 2024-08-11 DIAGNOSIS — G894 Chronic pain syndrome: Secondary | ICD-10-CM

## 2024-08-11 DIAGNOSIS — M160 Bilateral primary osteoarthritis of hip: Secondary | ICD-10-CM

## 2024-08-12 ENCOUNTER — Other Ambulatory Visit: Payer: Self-pay | Admitting: Family Medicine

## 2024-08-12 ENCOUNTER — Ambulatory Visit: Admitting: Physical Therapy

## 2024-08-12 ENCOUNTER — Ambulatory Visit
Admission: RE | Admit: 2024-08-12 | Discharge: 2024-08-12 | Disposition: A | Source: Ambulatory Visit | Attending: Family Medicine | Admitting: Family Medicine

## 2024-08-12 DIAGNOSIS — E66813 Obesity, class 3: Secondary | ICD-10-CM

## 2024-08-12 DIAGNOSIS — L739 Follicular disorder, unspecified: Secondary | ICD-10-CM

## 2024-08-12 DIAGNOSIS — G8929 Other chronic pain: Secondary | ICD-10-CM

## 2024-08-12 DIAGNOSIS — E1169 Type 2 diabetes mellitus with other specified complication: Secondary | ICD-10-CM

## 2024-08-12 DIAGNOSIS — I700 Atherosclerosis of aorta: Secondary | ICD-10-CM

## 2024-08-12 DIAGNOSIS — J439 Emphysema, unspecified: Secondary | ICD-10-CM

## 2024-08-12 DIAGNOSIS — Z748 Other problems related to care provider dependency: Secondary | ICD-10-CM

## 2024-08-12 DIAGNOSIS — M48061 Spinal stenosis, lumbar region without neurogenic claudication: Secondary | ICD-10-CM

## 2024-08-12 DIAGNOSIS — Z23 Encounter for immunization: Secondary | ICD-10-CM

## 2024-08-12 DIAGNOSIS — G44219 Episodic tension-type headache, not intractable: Secondary | ICD-10-CM

## 2024-08-12 DIAGNOSIS — E119 Type 2 diabetes mellitus without complications: Secondary | ICD-10-CM

## 2024-08-12 DIAGNOSIS — M545 Low back pain, unspecified: Secondary | ICD-10-CM

## 2024-08-12 DIAGNOSIS — E1159 Type 2 diabetes mellitus with other circulatory complications: Secondary | ICD-10-CM

## 2024-08-12 DIAGNOSIS — M1711 Unilateral primary osteoarthritis, right knee: Secondary | ICD-10-CM | POA: Diagnosis not present

## 2024-08-12 DIAGNOSIS — M1712 Unilateral primary osteoarthritis, left knee: Secondary | ICD-10-CM | POA: Diagnosis not present

## 2024-08-12 MED ORDER — HYDROCODONE-ACETAMINOPHEN 10-325 MG PO TABS: 1.0000 | ORAL_TABLET | Freq: Four times a day (QID) | ORAL | 0 refills | Status: DC | PRN

## 2024-08-12 MED ORDER — MORPHINE SULFATE ER 15 MG PO TBCR: 15.0000 mg | EXTENDED_RELEASE_TABLET | Freq: Two times a day (BID) | ORAL | 0 refills | Status: DC

## 2024-08-14 ENCOUNTER — Ambulatory Visit: Admitting: Physical Therapy

## 2024-08-14 ENCOUNTER — Inpatient Hospital Stay: Admitting: Internal Medicine

## 2024-08-14 ENCOUNTER — Other Ambulatory Visit

## 2024-08-14 VITALS — BP 140/80 | HR 70 | Temp 96.7°F | Resp 17 | Ht 64.0 in | Wt 242.5 lb

## 2024-08-14 DIAGNOSIS — C3490 Malignant neoplasm of unspecified part of unspecified bronchus or lung: Secondary | ICD-10-CM

## 2024-08-14 DIAGNOSIS — C3432 Malignant neoplasm of lower lobe, left bronchus or lung: Secondary | ICD-10-CM | POA: Diagnosis not present

## 2024-08-14 NOTE — Progress Notes (Signed)
 [facility] [phone]   Fax:([phone]) [phone]  OFFICE PROGRESS NOTE  McDiarmid, Krystal BIRCH, MD [address] [place] [address]  DIAGNOSIS: Stage IA (T1b, N0, M0) non-small cell lung cancer, adenocarcinoma presented with left lower lobe lung nodule diagnosed in May 2025.   PRIOR THERAPY: Curative SBRT to the left lower lobe lung nodule under the care of Dr. Patrcia completed on May 07, 2024  CURRENT THERAPY: Observation  INTERVAL HISTORY: [name] 73 y.o. female returns to the clinic today for follow-up visit. Discussed the use of AI scribe software for clinical note transcription with the patient, who gave verbal consent to proceed.  History of Present Illness [name] is a 73 year old female with stage one lung adenocarcinoma who presents for evaluation with a CT scan of the chest for restaging of her disease.  She was diagnosed with stage one lung adenocarcinoma in May 2025 and underwent curative stereotactic body radiation therapy, completed on May 07, 2024. Since then, she has been under observation.  She feels more 'stopped up' recently, which she attributes to not using her allergy spray that morning. She also has a persistent cough, though she is unsure if it has worsened, noting her history of emphysema prior to the lung cancer diagnosis. No sputum production is reported.  Her current medication regimen includes an allergy spray, though the specific medication, dose, and frequency were not discussed.     MEDICAL HISTORY: Past Medical History:  Diagnosis Date   Adrenal adenoma, left 12/02/2020   CT AP 11/16/20:  left adrenal mass measuring approximately 2.7 cm. This is minimally increased in size from prior study and is consistent with a benign adrenal adenoma.    Adrenal nodule 04/08/2011   04/08/11 Abdominal CT showed Left adrenal nodule which is tachnically indeterminate but most likely an adenoma.  It is 1.7 cm and 42  HU on portal venous phase image. 31 HU on delayed images.   01/09/2012 Abdominal CT w/ & w/o CM: Stable benign left adrenal adenoma. No further specific follow-up is needed for this finding.     Allergic rhinitis 01/03/2007   Qualifier: Diagnosis of  By: Kivett, Whitney     Allergy    Anemia    hx of  in 20's   Anxiety    Bipolar disorder (HCC)    Carpal tunnel syndrome 02/14/2007   S/P carpel tunnel release bilaterally.     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 03/28/2018 03/28/2018   COPD WITHOUT EXACERBATION 01/03/2007   Qualifier: Diagnosis of  By: Damien Folks  emphysema   Degenerative joint disease of both hips 05/23/2007   CT AP 11/27/20: Thre are degenerative changes of both hips     Diabetes mellitus without complication (HCC)    A1C 6.5 as of 05-2019- diet changes, no meds currently    DISC WITH RADICULOPATHY 01/03/2007   Qualifier: Diagnosis of  By: Damien Folks     Emphysema of lung (HCC)    Emphysema of Lung on CT 01/10/2024   Esophageal reflux 01/03/2007   Centricity Description: GASTROESOPHAGEAL REFLUX, NO ESOPHAGITIS Qualifier: Diagnosis of  By: Damien Folks   Centricity Description: GERD Qualifier: Diagnosis of  By: Zackary MD, Weston     External hemorrhoids s/p hemorrhoidectomy 03/28/2018 03/28/2018   Facial pain 09/20/2022   Family history of breast  cancer    Family history of cancer of female genital organ    Family history of colon cancer    Family history of gene mutation    BRIP1 gene mutation (c.2010dup) in daughter   Family history of lung cancer    Fibromyalgia    Fracture of bone spur of inferior portion of calcaneus 10/06/2018   Functional gastrointestinal disorder 06/18/2008   Functional Gastrointestinal Disorder with frequent eructation and bloating sensation with acute flares.  Responsive to Compazine  and Bentyl .  Takes a few  days to calm down.     GERD without esophagitis 01/03/2007   Centricity Description: GASTROESOPHAGEAL REFLUX, NO ESOPHAGITIS Qualifier: Diagnosis of  By: Damien Folks   Centricity Description: GERD Qualifier: Diagnosis of  By: Zackary MD, Weston     Hearing impairment 04/28/2021   Heel spur, fracture, left 04/17/2018   HEMORRHOIDS, NOS 01/03/2007   Qualifier: Diagnosis of  By: Damien Folks     Hepatic steatosis 04/12/2011   Herpes simplex labialis 05/11/2011   Hip osteoarthritis 05/23/2007   MRI Pelvis (03/13/07) Bilateral osteoarthritis of the hips, left greater than right.  The  patient has a slightly more prominent hip effusion on the left than  the right.  No other significant abnormality.     History of MRSA infection 04/28/2011   HYPERCHOLESTEROLEMIA 12/24/2008   Hypertension    HYPERTENSION, BENIGN SYSTEMIC 01/03/2007   Qualifier: Diagnosis of  By: Damien Folks     HYPERTRIGLYCERIDEMIA 11/11/2007   Qualifier: Diagnosis of  By: McDiarmid MD, Todd     Hypokalemia 12/12/2017   Hypothyroidism 02/15/2007   Qualifier: Diagnosis of  By: McDiarmid MD, Krystal BAND, BORDERLINE 02/15/2007   Insomnia 03/03/2021   Intra-abdominal adhesions    Irritable bowel syndrome 01/03/2007   Qualifier: Diagnosis of  By: Damien Folks     Knee pain, acute 12/01/2022   LACTOSE INTOLERANCE 03/10/2008   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   Leukoplakia of oral cavity 09/20/2022   Major depressive disorder, recurrent episode 01/03/2007   Qualifier: Diagnosis of  By: Kivett, Whitney     Medication management 12/14/2016   Managing topiramate  for weight loss starting 12/14/16   Memory difficulties 03/04/2021   Metabolic syndrome 02/29/2012   Monoallelic mutation of BRIP1 gene 01/17/2021   c.2010dup (p.[id number]*)   NAFL (nonalcoholic fatty liver) 04/12/2011   Mild hepatic Steatosis on CT 04/08/11 for  abdominal pain.      No impairment of memory 04/29/2021   Obesity (BMI 30.0-34.9) 09/30/2007   Has lost 55 lbs since easter. Goal of <190 by 08/05/12.      Obesity, Class III, BMI 40-49.9 (morbid obesity) 09/30/2007        Osteoarthritis of spine 01/03/2007   MR I Lumbar (08/03): GeneralDDD; L2-3 Large  HNP.  Mod pos  hnp - 12/14/2005, smhnpw/irritL4root - 12/14/2005     OSTEOARTHRITIS, HANDS, BILATERAL 06/18/2008   Qualifier: Diagnosis of  By: McDiarmid MD, Krystal BEAL FEMORAL STRESS SYNDROME 01/03/2007   Qualifier: Diagnosis of  By: Damien Folks     Periodic limb movements of sleep 12/05/2010   Diagnosed on Polysomnography testing Fall 2011.      Pneumonia    walking pneumonia in the 90's   Pre-diabetes 12/25/2008   Qualifier: Diagnosis of  By: McDiarmid MD, Todd     Prolapsed internal hemorrhoids, grade 3, s/p ligation/pexy/hemorrhoidectomy 03/28/2018 03/28/2018   Pulmonary nodules/lesions, multiple  03/02/2023   Pyriformis syndrome, right 03/02/2023   RESTLESS LEGS SYNDROME 09/11/2007   Polysomnography (10/2010, Dr Quita Salt, interpreter. ) showed periodic limb movements that frequently awoke patient from sleep with arousal index of 4 per hour. Dr Francis Dresser (Sleep Specialist) thought her RLS was the major contributor to patient poor sleep condition, more than any sleep apnea.  He recommended considering opamine agonist (either requip or mirapex) to see if this will help.      Rosacea, acne 10/24/2011   SIGMOID POLYP 01/23/2008   Solitary lung nodule 04/08/2011   Stage 1 mild COPD by GOLD classification (HCC) 01/03/2007   11/28/2017 Chest CT: Mild centrilobular and paraseptal emphysema with mild diffuse bronchial wall thickening     Stress incontinence    Synovial cyst of lumbar facet joint 02/17/2016   Trigger thumb of left hand 08/16/2018   Type 2 diabetes mellitus with obesity 06/30/2019   IMO SNOMED Dx Update Oct 2024     Urticaria, chronic 05/22/2011   VITAMIN D   DEFICIENCY 03/03/2010   Work-related condition 05/17/2018    ALLERGIES:  is allergic to diclofenac-misoprostol, aripiprazole, emsam [selegiline], estradiol, ibuprofen, naproxen, neurontin [gabapentin], paroxetine, prednisone, and tape.  MEDICATIONS:  Current Outpatient Medications  Medication Sig Dispense Refill   atorvastatin  (LIPITOR) 20 MG tablet TAKE 1 TABLET BY MOUTH EVERY DAY 90 tablet 3   azithromycin  (ZITHROMAX ) 250 MG tablet Take 2 tablets on day 1, and then 1 tablet for the next 4 days until gone. 6 tablet 0   buPROPion (ZYBAN) 150 MG 12 hr tablet Take 150 mg by mouth 2 (two) times daily.     Calcium  Carb-Cholecalciferol (CALCIUM -VITAMIN D ) 600-400 MG-UNIT TABS Take 1 tablet by mouth daily.     citalopram  (CELEXA ) 20 MG tablet Take 20 mg by mouth at bedtime.     Continuous Glucose Sensor (FREESTYLE LIBRE 14 DAY SENSOR) MISC Inject 1 Device into the skin every 14 (fourteen) days. APPLY EVERY 14 DAYS 2 each PRN   dicyclomine  (BENTYL ) 20 MG tablet Take 1 tablet (20 mg total) by mouth every 8 (eight) hours as needed for spasms 90 tablet 0   fexofenadine  (ALLEGRA ) 180 MG tablet Take 180 mg by mouth daily.     fluticasone (FLONASE) 50 MCG/ACT nasal spray Place 2 sprays into both nostrils daily.     hydrochlorothiazide  (HYDRODIURIL ) 25 MG tablet TAKE 1 TABLET BY MOUTH EVERY DAY 90 tablet 3   [START ON 08/15/2024] HYDROcodone -acetaminophen  (NORCO) 10-325 MG tablet Take 1 tablet by mouth every 6 (six) hours as needed for moderate pain (pain score 4-6). 52 tablet 0   KLOR-CON  M10 10 MEQ tablet TAKE 4 TABLETS (40 MEQ TOTAL) BY MOUTH AT BEDTIME. 360 tablet 3   levothyroxine  (SYNTHROID ) 100 MCG tablet Take 1 tablet (100 mcg total) by mouth every morning. 30 minutes before food 90 tablet 3   LORazepam  (ATIVAN ) 0.5 MG tablet Take 0.5 mg by mouth 3 (three) times daily as needed.     metoprolol  tartrate (LOPRESSOR ) 50 MG tablet TAKE 1 TABLET BY MOUTH TWICE A DAY 180 tablet 3   [START ON 08/15/2024]  morphine  (MS CONTIN ) 15 MG 12 hr tablet Take 1 tablet (15 mg total) by mouth every 12 (twelve) hours. 60 tablet 0   Multiple Vitamins-Minerals (MULTIVITAMIN WITH MINERALS) tablet Take 1 tablet by mouth daily.     omeprazole  (PRILOSEC) 40 MG capsule TAKE 1 CAPSULE BY MOUTH EVERY DAY 90 capsule 3   polyethylene glycol powder (GLYCOLAX /MIRALAX ) 17 GM/SCOOP powder Take  17 g by mouth as needed for mild constipation.     prazosin  (MINIPRESS ) 2 MG capsule Take 2 mg by mouth at bedtime.     Probiotic Product (PROBIOTIC DAILY PO) Take 1 capsule by mouth daily.     promethazine  (PHENERGAN ) 25 MG tablet Take 0.5 tablets (12.5 mg total) by mouth every 8 (eight) hours as needed for nausea or vomiting. 30 tablet 0   ramipril  (ALTACE ) 10 MG capsule TAKE 1 CAPSULE BY MOUTH EVERY DAY 90 capsule 3   senna-docusate (SENOKOT-S) 8.6-50 MG tablet Take 2 tablets by mouth at bedtime. For AFTER surgery, do not take if having diarrhea 30 tablet 0   solifenacin  (VESICARE ) 5 MG tablet TAKE 1 TABLET (5 MG TOTAL) BY MOUTH DAILY. 90 tablet 3   tiZANidine  (ZANAFLEX ) 2 MG tablet Take 2 mg by mouth once.     traZODone  (DESYREL ) 100 MG tablet Take 200 mg by mouth at bedtime.     vitamin B-12 (CYANOCOBALAMIN ) 1000 MCG tablet Take 1,000 mcg by mouth daily.     No current facility-administered medications for this visit.    SURGICAL HISTORY:  Past Surgical History:  Procedure Laterality Date   APPENDECTOMY     bladder tack  1998   Dr Watt (urology)   BRONCHIAL BIOPSY  03/24/2024   Procedure: BRONCHOSCOPY, WITH BIOPSY;  Surgeon: Shelah Lamar RAMAN, MD;  Location: [facility] ENDOSCOPY;  Service: Pulmonary;;   BRONCHIAL BRUSHINGS  03/24/2024   Procedure: BRONCHOSCOPY, WITH BRUSH BIOPSY;  Surgeon: Shelah Lamar RAMAN, MD;  Location: [facility] ENDOSCOPY;  Service: Pulmonary;;   BRONCHIAL NEEDLE ASPIRATION BIOPSY  03/24/2024   Procedure: BRONCHOSCOPY, WITH NEEDLE ASPIRATION BIOPSY;  Surgeon: Shelah Lamar RAMAN, MD;  Location: [facility] ENDOSCOPY;  Service:  Pulmonary;;   BRONCHOSCOPY, WITH BIOPSY USING ELECTROMAGNETIC NAVIGATION Bilateral 03/24/2024   Procedure: BRONCHOSCOPY, WITH BIOPSY USING ELECTROMAGNETIC NAVIGATION;  Surgeon: Shelah Lamar RAMAN, MD;  Location: [facility] ENDOSCOPY;  Service: Pulmonary;  Laterality: Bilateral;  with Fluoro   BUNIONECTOMY  1992   bilateral feet with pins in great toes   CARDIAC CATHETERIZATION     CARPAL TUNNEL RELEASE  2010   Bilateral, Dr Lamar Sypher   COLONOSCOPY  2004   Dr DOROTHA Sous (GI)   CYSTOCELE REPAIR  1998    Dr Evangeline (GYN)-   ESOPHAGOGASTRODUODENOSCOPY     EVALUATION UNDER ANESTHESIA WITH HEMORRHOIDECTOMY N/A 03/28/2018   Procedure: ANORECTAL EXAM UNDER ANESTHESIA lateral internal partial sphincterotomy, hemorrhoidal ligation pixie, HEMORRHOIDECTOMY;  Surgeon: Sheldon Standing, MD;  Location: [facility] ORS;  Service: General;  Laterality: N/A;   EXAM ANORECTAL W/ ULTRASOUND     Dr. Sheldon 03-28-18    EXPLORATORY LAPAROTOMY     FACIAL LACERATIONS REPAIR     Facial Laceration repair as adolescent    FUDUCIAL PLACEMENT  03/24/2024   Procedure: INSERTION, FIDUCIAL MARKER, GOLD;  Surgeon: Shelah Lamar RAMAN, MD;  Location: [facility] ENDOSCOPY;  Service: Pulmonary;;   INJECTION HIP INTRA ARTICULAR  2008   Left Hip fluoroscopically-guided corticosteorid injection for painful osteoarthritis with good effect (06/2007)   LAPAROSCOPIC LYSIS OF ADHESIONS  05/19/2021   Procedure: LAPAROSCOPIC LYSIS OF ADHESIONS; BIOPSY ABDOMINAL SKIN LESION;  Surgeon: Viktoria Comer SAUNDERS, MD;  Location: [facility] ORS;  Service: Gynecology;;   LAPAROSCOPY FOR ECTOPIC PREGNANCY     LUMBAR LAMINECTOMY/DECOMPRESSION MICRODISCECTOMY N/A 06/21/2016   Procedure: MICRO LUMBAR DECOMPRESSION L4-L5 AND REMOVAL OF SYNOVIAL CYST    1 LEVEL;  Surgeon: Reyes Billing, MD;  Location: [facility] ORS;  Service: Orthopedics;  Laterality: N/A;  MRSA     NM MYOVIEW  LTD  2011   Dr Victory Sharps, III (Card)   RECTOCELE REPAIR  1998   Dr Evangeline (GYN)   ROBOTIC ASSISTED SALPINGO OOPHERECTOMY  N/A 05/19/2021   Procedure: XI ROBOTIC ASSISTED LEFT SALPINGO- OOPHORECTOMY WITH PELVIC WASHINGS;  Surgeon: Viktoria Comer SAUNDERS, MD;  Location: [facility] ORS;  Service: Gynecology;  Laterality: N/A;   SPHINCTEROTOMY N/A 03/28/2018   Procedure: ANAL SPHINCTEROTOMY;  Surgeon: Sheldon Standing, MD;  Location: [facility] ORS;  Service: General;  Laterality: N/A;   UPPER GASTROINTESTINAL ENDOSCOPY      REVIEW OF SYSTEMS:  A comprehensive review of systems was negative except for: Constitutional: positive for fatigue Respiratory: positive for dyspnea on exertion   PHYSICAL EXAMINATION: General appearance: alert, cooperative, fatigued, and no distress Head: Normocephalic, without obvious abnormality, atraumatic Neck: no adenopathy, no JVD, supple, symmetrical, trachea midline, and thyroid  not enlarged, symmetric, no tenderness/mass/nodules Lymph nodes: Cervical, supraclavicular, and axillary nodes normal. Resp: clear to auscultation bilaterally Back: symmetric, no curvature. ROM normal. No CVA tenderness. Cardio: regular rate and rhythm, S1, S2 normal, no murmur, click, rub or gallop GI: soft, non-tender; bowel sounds normal; no masses,  no organomegaly Extremities: extremities normal, atraumatic, no cyanosis or edema  ECOG PERFORMANCE STATUS: 1 - Symptomatic but completely ambulatory  Blood pressure (!) 140/80, pulse 70, temperature (!) 96.7 F (35.9 C), resp. rate 17, height 5' 4 (1.626 m), weight 242 lb 8 oz (110 kg), SpO2 96%.  LABORATORY DATA: Lab Results  Component Value Date   WBC 9.2 08/07/2024   HGB 12.6 08/07/2024   HCT 37.6 08/07/2024   MCV 92.8 08/07/2024   PLT 230 08/07/2024      Chemistry      Component Value Date/Time   NA 132 (L) 08/07/2024 1257   NA 136 03/01/2023 1736   K 4.4 08/07/2024 1257   CL 95 (L) 08/07/2024 1257   CO2 32 08/07/2024 1257   BUN 11 08/07/2024 1257   BUN 10 03/01/2023 1736   CREATININE 0.82 08/07/2024 1257   CREATININE 0.78 06/17/2015 1208      Component  Value Date/Time   CALCIUM  9.8 08/07/2024 1257   ALKPHOS 51 08/07/2024 1257   AST 20 08/07/2024 1257   ALT 18 08/07/2024 1257   BILITOT 0.5 08/07/2024 1257       RADIOGRAPHIC STUDIES: DG Knee 1-2 Views Left Result Date: 08/14/2024 CLINICAL DATA:  Chronic BILATERAL knee pain, greater on the LEFT. EXAM: DG KNEE 1-2V*L*; DG KNEE 1-2V*R* COMPARISON:  RIGHT knee radiographs 11/25/2013 FINDINGS: RIGHT knee: Moderate joint space loss and spurring of the medial compartment. Minimal spurring of the lateral and patellofemoral compartments. Mild patchy osteopenia. No acute osseous abnormality. Soft tissues are within normal limits. LEFT knee: Mild spurring of the patellofemoral compartment. Mild joint space loss in the medial compartment. No acute osseous abnormalities. Soft tissues within normal limits. IMPRESSION: 1. Tricompartmental osteoarthrosis of the right knee, worst in the medial compartment. 2. Mild degenerative changes of the left knee. Electronically Signed   By: Aliene Lloyd M.D.   On: 08/14/2024 08:02   DG Knee 1-2 Views Right Result Date: 08/14/2024 CLINICAL DATA:  Chronic BILATERAL knee pain, greater on the LEFT. EXAM: DG KNEE 1-2V*L*; DG KNEE 1-2V*R* COMPARISON:  RIGHT knee radiographs 11/25/2013 FINDINGS: RIGHT knee: Moderate joint space loss and spurring of the medial compartment. Minimal spurring of the lateral and patellofemoral compartments. Mild patchy osteopenia. No acute osseous abnormality. Soft tissues are within normal limits. LEFT knee: Mild  spurring of the patellofemoral compartment. Mild joint space loss in the medial compartment. No acute osseous abnormalities. Soft tissues within normal limits. IMPRESSION: 1. Tricompartmental osteoarthrosis of the right knee, worst in the medial compartment. 2. Mild degenerative changes of the left knee. Electronically Signed   By: Aliene Lloyd M.D.   On: 08/14/2024 08:02   CT Chest W Contrast Result Date: 08/08/2024 CLINICAL DATA:  Non-small  cell lung cancer (NSCLC), staging. * Tracking Code: BO * EXAM: CT CHEST WITH CONTRAST TECHNIQUE: Multidetector CT imaging of the chest was performed during intravenous contrast administration. RADIATION DOSE REDUCTION: This exam was performed according to the departmental dose-optimization program which includes automated exposure control, adjustment of the mA and/or kV according to patient size and/or use of iterative reconstruction technique. CONTRAST:  75mL OMNIPAQUE  IOHEXOL  300 MG/ML  SOLN COMPARISON:  CT scan chest from 01/21/2024 and PET-CT scan from 02/25/2024. FINDINGS: Cardiovascular: Normal cardiac size. No pericardial effusion. No aortic aneurysm. There are coronary artery calcifications, in keeping with coronary artery disease. There are also mild-to-moderate peripheral atherosclerotic vascular calcifications of thoracic aorta and its major branches. Mediastinum/Nodes: Atrophic versus surgically removed thyroid  gland. No solid / cystic mediastinal masses. The esophagus is nondistended precluding optimal assessment. No axillary, mediastinal or hilar lymphadenopathy by size criteria. Lungs/Pleura: The central tracheo-bronchial tree is patent. Mild upper lobe predominant paraseptal emphysematous changes noted. There are dependent changes in bilateral lungs. There is trace right pleural effusion. No left pleural effusion. No mass or consolidation. No pneumothorax. Redemonstration of previously noted subpleural nodule in the left lung lower lobe, posterior inferiorly measuring 7 x 12 mm today's exam. There is no fiducial marker adjacent to the nodule, suggesting recent tissue sampling. There is irregular predominantly ground-glass subpleural nodule in the superior segment of right lower lobe measuring 8 x 13 mm, grossly similar to the prior study. No new suspicious lung nodule. Upper Abdomen: There is a stable 2.6 x 3.0 cm left adrenal adenoma. Remaining visualized upper abdominal viscera within normal  limits. Musculoskeletal: The visualized soft tissues of the chest wall are grossly unremarkable. No suspicious osseous lesions. There are mild to moderate multilevel degenerative changes in the visualized spine. IMPRESSION: 1. Redemonstration of previously seen subpleural nodule in the left lung lower lobe, posterior inferiorly measuring 7 x 12 mm today's exam. There is a new fiducial marker adjacent to the nodule, suggesting recent tissue sampling. The nodule appears slightly smaller if not similar in size, when compared to the prior exam 2. There is irregular predominantly ground-glass subpleural nodule in the superior segment of right lower lobe measuring 8 x 13 mm, grossly similar to the prior study. 3. No new suspicious lung nodule. 4. No new or enlarging lymphadenopathy. 5. Multiple other nonacute observations, as described above. Aortic Atherosclerosis (ICD10-I70.0) and Emphysema (ICD10-J43.9). Electronically Signed   By: Ree Molt M.D.   On: 08/08/2024 10:09    ASSESSMENT AND PLAN: This is a very pleasant 73 years old white female with Stage IA (T1b, N0, M0) non-small cell lung cancer, adenocarcinoma presented with left lower lobe lung nodule diagnosed in May 2025.  She is status post curative SBRT to the left lower lobe lung nodule under the care of Dr. Patrcia completed on May 07, 2024. The patient is currently on observation.  She had repeat CT scan of the chest performed recently.  I personally independently reviewed the scan and discussed the result with the patient today.  Her scan showed no concerning findings for disease recurrence or metastasis.  Assessment and Plan Assessment & Plan Lung adenocarcinoma, status post curative radiation, under surveillance Stage I lung adenocarcinoma, status post curative stereotactic body radiation therapy (SBRT) completed in July 2025. Current CT scan shows a reduction in size of the radiated area, likely due to scarring. No new or concerning findings  on the scan, only some ground glass opacities which are being monitored. The distinction between scar tissue and residual tumor remains challenging, but no evidence of disease progression is noted. - Continue surveillance with CT scans every 6 months for the first 2 years post-treatment. - Schedule next CT scan in 6 months. - Arrange follow-up visit in 6 months.  Emphysema Chronic emphysema with cough, potentially exacerbated by the condition. No acute changes noted. Advised to be cautious of infections such as flu and COVID-19, which could exacerbate respiratory symptoms. - Advise precautions to prevent respiratory infections, including flu and COVID-19. - Encourage use of allergy spray as needed. The patient was advised to call immediately if she has any concerning symptoms in the interval. The patient voices understanding of current disease status and treatment options and is in agreement with the current care plan.  All questions were answered. The patient knows to call the clinic with any problems, questions or concerns. We can certainly see the patient much sooner if necessary. The total time spent in the appointment was 20 minutes including review of chart and various tests results, discussions about plan of care and coordination of care plan .   Disclaimer: This note was dictated with voice recognition software. Similar sounding words can inadvertently be transcribed and may not be corrected upon review.

## 2024-08-15 ENCOUNTER — Ambulatory Visit: Payer: Self-pay | Admitting: Family Medicine

## 2024-08-15 ENCOUNTER — Encounter: Payer: Self-pay | Admitting: Family Medicine

## 2024-08-15 DIAGNOSIS — M170 Bilateral primary osteoarthritis of knee: Secondary | ICD-10-CM

## 2024-08-15 DIAGNOSIS — G8929 Other chronic pain: Secondary | ICD-10-CM

## 2024-08-16 ENCOUNTER — Other Ambulatory Visit (HOSPITAL_COMMUNITY): Payer: Self-pay

## 2024-08-17 ENCOUNTER — Other Ambulatory Visit (HOSPITAL_COMMUNITY): Payer: Self-pay

## 2024-08-18 ENCOUNTER — Other Ambulatory Visit: Payer: Self-pay

## 2024-08-18 DIAGNOSIS — M170 Bilateral primary osteoarthritis of knee: Secondary | ICD-10-CM | POA: Insufficient documentation

## 2024-08-18 NOTE — Telephone Encounter (Signed)
 Refrral sent to Dr DOROTHA Billing (ortho) for bilateral tricompartmental knee osteoarthritis and chronic bilateral knee pain.

## 2024-08-19 ENCOUNTER — Ambulatory Visit: Admitting: Physical Therapy

## 2024-08-21 ENCOUNTER — Ambulatory Visit: Admitting: Physical Therapy

## 2024-08-23 ENCOUNTER — Encounter: Payer: Self-pay | Admitting: Family Medicine

## 2024-08-26 ENCOUNTER — Ambulatory Visit (INDEPENDENT_AMBULATORY_CARE_PROVIDER_SITE_OTHER): Admitting: Family Medicine

## 2024-08-26 ENCOUNTER — Telehealth (HOSPITAL_COMMUNITY): Payer: Self-pay

## 2024-08-26 ENCOUNTER — Encounter: Payer: Self-pay | Admitting: Family Medicine

## 2024-08-26 VITALS — BP 123/71 | HR 69 | Ht 64.0 in | Wt 262.5 lb

## 2024-08-26 DIAGNOSIS — C3430 Malignant neoplasm of lower lobe, unspecified bronchus or lung: Secondary | ICD-10-CM

## 2024-08-26 DIAGNOSIS — J439 Emphysema, unspecified: Secondary | ICD-10-CM | POA: Diagnosis not present

## 2024-08-26 DIAGNOSIS — Z23 Encounter for immunization: Secondary | ICD-10-CM | POA: Diagnosis not present

## 2024-08-26 MED ORDER — UMECLIDINIUM-VILANTEROL 62.5-25 MCG/ACT IN AEPB: 1.0000 | INHALATION_SPRAY | Freq: Every day | RESPIRATORY_TRACT | 1 refills | Status: DC

## 2024-08-26 MED ORDER — ALBUTEROL SULFATE HFA 108 (90 BASE) MCG/ACT IN AERS: 2.0000 | INHALATION_SPRAY | RESPIRATORY_TRACT | 0 refills | Status: DC | PRN

## 2024-08-26 NOTE — Patient Instructions (Signed)
  VISIT SUMMARY: Today, we addressed your breathing difficulties related to emphysema and your history of lung cancer. We also discussed your hip pain, sleep disturbances, and general health maintenance.  YOUR PLAN: EMPHYSEMA WITH CHRONIC DYSPNEA: You have chronic breathing difficulties likely due to emphysema and radiation-induced scar tissue. -Start using Anoro Ellipta inhaler once daily. -Use albuterol  inhaler as needed for acute symptoms. -Use an incentive spirometer at home to help improve lung function.  PULMONARY REHABILITATION FOR CHRONIC LUNG DISEASE: You need pulmonary rehabilitation to help with lung tightness and breathing difficulties caused by radiation treatment. -You have been referred to pulmonary rehabilitation.   GENERAL HEALTH MAINTENANCE: You were due for a COVID-19 vaccination. -You received your COVID-19 vaccination today.                      Contains text generated by Abridge.                                 Contains text generated by Abridge.

## 2024-08-26 NOTE — Assessment & Plan Note (Signed)
 Progressively worsening symptoms related to breathing and mobility Likely has underlying scar tissue from her lung cancer treatment Reassuringly normal SpO2, clear lung sounds Mmrc >2, meets gold B criteria -Rx Anoro Ellipta (LABA/LAMA) daily -Rx Albuterol  PRN -Referral to pulmonary rehab, recommend incentive spirometry (pt to purchase) as I think long term rehab would be most beneficial for her -close f/u scheduled with Dr. McDiarmid next month

## 2024-08-26 NOTE — Telephone Encounter (Signed)
Pt is not interested in the pulmonary rehab program at this time. °Closed referral. °

## 2024-08-26 NOTE — Progress Notes (Signed)
    SUBJECTIVE:   CHIEF COMPLAINT / HPI:   Here to discuss need for inhaler, having breathing issues   Discussed the use of AI scribe software for clinical note transcription with the patient, who gave verbal consent to proceed.  History of Present Illness [name] is a 73 year old female with emphysema and a history of stage one lung cancer who presents with breathing difficulties.  Dyspnea and exercise intolerance - Experiences persistent breathing difficulties, described as inability to breathe deeply enough - Shortness of breath occurs after walking a short distance <100 yards - Feels less capable of physical activity compared to peers - No wheezing or new chest pain - Uses nasal spray as needed without significant relief - Avoids public places due to fear of infection - Increased awareness of breathing difficulties with recent increase in activity while assisting family  History of pulmonary malignancy - Diagnosed with stage one lung cancer in May - Underwent radiation therapy - Follow-up scans scheduled every six months  Tobacco use - Quit smoking in 2009     PERTINENT  PMH / PSH: History of stage I lung adenocarcinoma.  Recently saw oncology and was advised to continue surveillance CT scans every 6 months.  Her most recent CT scan in early October did show emphysema  OBJECTIVE:   BP 123/71   Pulse 69   Ht 5' 4 (1.626 m)   Wt 262 lb 8 oz (119.1 kg)   SpO2 97%   BMI 45.06 kg/m    General: NAD, pleasant, able to participate in exam Cardiac: RRR, no murmurs auscultated Respiratory: CTAB, normal WOB, no wheezes Abdomen: soft, non-tender, non-distended, normoactive bowel sounds Extremities: warm and well perfused, no edema or cyanosis Skin: warm and dry, no rashes noted Neuro: alert, no obvious focal deficits, speech normal Psych: Normal affect and mood  ASSESSMENT/PLAN:     Assessment & Plan Emphysema of Lung on CT Non-small cell cancer of lower  lobe of lung (HCC) Progressively worsening symptoms related to breathing and mobility Likely has underlying scar tissue from her lung cancer treatment Reassuringly normal SpO2, clear lung sounds Mmrc >2, meets gold B criteria -Rx Anoro Ellipta (LABA/LAMA) daily -Rx Albuterol  PRN -Referral to pulmonary rehab, recommend incentive spirometry (pt to purchase) as I think long term rehab would be most beneficial for her -close f/u scheduled with Dr. McDiarmid next month   Payton Coward, MD [facility]

## 2024-09-11 ENCOUNTER — Other Ambulatory Visit: Payer: Self-pay

## 2024-09-11 ENCOUNTER — Other Ambulatory Visit (HOSPITAL_COMMUNITY): Payer: Self-pay

## 2024-09-11 ENCOUNTER — Ambulatory Visit: Admitting: Family Medicine

## 2024-09-11 ENCOUNTER — Encounter: Payer: Self-pay | Admitting: Family Medicine

## 2024-09-11 VITALS — BP 120/70 | HR 60 | Ht 64.0 in | Wt 263.5 lb

## 2024-09-11 DIAGNOSIS — Z79899 Other long term (current) drug therapy: Secondary | ICD-10-CM

## 2024-09-11 DIAGNOSIS — I152 Hypertension secondary to endocrine disorders: Secondary | ICD-10-CM | POA: Diagnosis not present

## 2024-09-11 DIAGNOSIS — G2581 Restless legs syndrome: Secondary | ICD-10-CM | POA: Diagnosis not present

## 2024-09-11 DIAGNOSIS — E119 Type 2 diabetes mellitus without complications: Secondary | ICD-10-CM

## 2024-09-11 DIAGNOSIS — J449 Chronic obstructive pulmonary disease, unspecified: Secondary | ICD-10-CM

## 2024-09-11 DIAGNOSIS — K580 Irritable bowel syndrome with diarrhea: Secondary | ICD-10-CM | POA: Diagnosis not present

## 2024-09-11 DIAGNOSIS — Z79891 Long term (current) use of opiate analgesic: Secondary | ICD-10-CM | POA: Diagnosis not present

## 2024-09-11 DIAGNOSIS — M47896 Other spondylosis, lumbar region: Secondary | ICD-10-CM

## 2024-09-11 DIAGNOSIS — E1159 Type 2 diabetes mellitus with other circulatory complications: Secondary | ICD-10-CM | POA: Diagnosis not present

## 2024-09-11 DIAGNOSIS — M48061 Spinal stenosis, lumbar region without neurogenic claudication: Secondary | ICD-10-CM | POA: Diagnosis not present

## 2024-09-11 DIAGNOSIS — M160 Bilateral primary osteoarthritis of hip: Secondary | ICD-10-CM | POA: Diagnosis not present

## 2024-09-11 DIAGNOSIS — G894 Chronic pain syndrome: Secondary | ICD-10-CM

## 2024-09-11 DIAGNOSIS — F419 Anxiety disorder, unspecified: Secondary | ICD-10-CM

## 2024-09-11 DIAGNOSIS — J439 Emphysema, unspecified: Secondary | ICD-10-CM

## 2024-09-11 LAB — POCT GLYCOSYLATED HEMOGLOBIN (HGB A1C): HbA1c, POC (controlled diabetic range): 6.2 % (ref 0.0–7.0)

## 2024-09-11 MED ORDER — MORPHINE SULFATE ER 15 MG PO TBCR
15.0000 mg | EXTENDED_RELEASE_TABLET | Freq: Two times a day (BID) | ORAL | 0 refills | Status: DC
  Filled 2024-09-13: qty 60, 30d supply, fill #0
  Filled ????-??-??: fill #0

## 2024-09-11 MED ORDER — NALOXONE HCL 4 MG/0.1ML NA LIQD
NASAL | 99 refills | Status: AC
  Filled 2024-09-11: qty 2, 30d supply, fill #0

## 2024-09-11 MED ORDER — HYDROCODONE-ACETAMINOPHEN 10-325 MG PO TABS
1.0000 | ORAL_TABLET | Freq: Four times a day (QID) | ORAL | 0 refills | Status: DC | PRN
  Filled 2024-09-13: qty 52, 13d supply, fill #0
  Filled ????-??-??: fill #0

## 2024-09-11 NOTE — Patient Instructions (Signed)
 Do keep you Narcan up to date.   It sounds like the pain medication is helping you do what you need to get done without causing adverse effects.   You have arthritis in your left shoulder and in both your knees.

## 2024-09-12 ENCOUNTER — Encounter: Payer: Self-pay | Admitting: Family Medicine

## 2024-09-12 ENCOUNTER — Other Ambulatory Visit (HOSPITAL_COMMUNITY): Payer: Self-pay

## 2024-09-12 ENCOUNTER — Other Ambulatory Visit (HOSPITAL_BASED_OUTPATIENT_CLINIC_OR_DEPARTMENT_OTHER): Payer: Self-pay

## 2024-09-12 ENCOUNTER — Other Ambulatory Visit: Payer: Self-pay

## 2024-09-12 ENCOUNTER — Encounter: Payer: Self-pay | Admitting: Pharmacist

## 2024-09-12 MED ORDER — TIOTROPIUM BROMIDE-OLODATEROL 2.5-2.5 MCG/ACT IN AERS
2.0000 | INHALATION_SPRAY | Freq: Every day | RESPIRATORY_TRACT | 5 refills | Status: AC
  Filled 2024-09-12: qty 4, 30d supply, fill #0
  Filled 2024-10-07: qty 4, 30d supply, fill #1
  Filled 2024-11-06: qty 4, 30d supply, fill #2

## 2024-09-12 NOTE — Assessment & Plan Note (Addendum)
 Established problem. Stable. Patient is at goal of diarrheal and bloating control. No signs of complications, medication side effects, or red flags. Continue current medications and other regiments.  - Uses dicyclomine  once in the morning - Aware of potential side effects on memory and falls

## 2024-09-12 NOTE — Assessment & Plan Note (Signed)
 Established problem. Adequate blood pressure control.  No evidence of new end organ damage.  Tolerating medication without significant adverse effects.  Plan to continue current blood pressure medication regiment.

## 2024-09-12 NOTE — Assessment & Plan Note (Signed)
 Established problem Class A GOLD COPD  Improved controlled. Patient with shortness of breath . Using albuterol  to help with completing ADLs Insurance did not cover prescribed Anoro Ellipta.   Start Tiotropium (on preferred list)  Continue prn albuterol  MDI

## 2024-09-12 NOTE — Progress Notes (Signed)
 [name] is alone Sources of clinical information for visit is/are patient. Nursing assessment for this office visit was reviewed with the patient for accuracy and revision.   Previous Report(s) Reviewed: PDMP reviewed    08/26/2024    1:34 PM  Depression screen PHQ 2/9  Decreased Interest 2  Down, Depressed, Hopeless 0  PHQ - 2 Score 2  Altered sleeping 2  Tired, decreased energy 3  Change in appetite 2  Feeling bad or failure about yourself  1  Trouble concentrating 3  Moving slowly or fidgety/restless 0  Suicidal thoughts 0  PHQ-9 Score 13   Difficult doing work/chores Very difficult     Data saved with a previous flowsheet row definition   Flowsheet Row Office Visit from 08/26/2024 in [facility] - [facility] Of [place]. [facility] Office Visit from 06/12/2024 in [facility] - [facility] Dept Of [place]. [facility] Office Visit from 02/28/2024 in [facility] - A Dept Of [place]. [facility]  Thoughts that you would be better off dead, or of hurting yourself in some way Not at all Not at all Not at all  PHQ-9 Total Score 13 14 13        09/11/2024   11:00 AM 08/26/2024    1:38 PM 06/12/2024    2:38 PM 04/01/2024    3:19 PM 03/03/2024    2:04 PM  Fall Risk   Falls in the past year? 0 0 0 0 0  Number falls in past yr: 0 0 0 0   Injury with Fall? 0 0 0 0   Risk for fall due to :    Impaired balance/gait;Impaired mobility Impaired balance/gait;Impaired mobility       08/26/2024    1:34 PM 06/12/2024    2:44 PM 06/12/2024    2:38 PM  PHQ9 SCORE ONLY  PHQ-9 Total Score 13 14  14       Data saved with a previous flowsheet row definition    There are no preventive care reminders to display for this patient.  Health Maintenance Due  Topic Date Due   DTaP/Tdap/Td (3 - Td or Tdap) 10/21/2023   Medicare Annual Wellness (AWV)  03/25/2024      History/P.E. limitations: none  There are no preventive care  reminders to display for this patient.  Diabetes Health Maintenance Due  Topic Date Due   HEMOGLOBIN A1C  03/11/2025   OPHTHALMOLOGY EXAM  04/09/2025    Health Maintenance Due  Topic Date Due   DTaP/Tdap/Td (3 - Td or Tdap) 10/21/2023   Medicare Annual Wellness (AWV)  03/25/2024     Chief Complaint  Patient presents with   Medical Management of Chronic Issues     Discussed the use of AI scribe software for clinical note transcription with the patient, who gave verbal consent to proceed.  History of Present Illness   [name] is a 73 year old female with knee arthritis and sciatica who presents with knee pain and medication management.  Knee pain and osteoarthritis - Knee pain exacerbated by a popping sensation during physical therapy with weights around her ankles - Knee x-ray demonstrates significant arthritis - Pain managed with hydrocodone  at bedtime and occasionally at 2 PM if severe - Tizanidine  used when pain is less intense - Constipation from pain medication managed with Miralax  and senna  Radicular leg pain (sciatica) - Sciatica primarily affects the right  side, occasionally the left - Physical therapy has not alleviated symptoms - Salonpas patches used for sciatica pain relief  Shoulder pain - Intermittent shoulder pain - Managed with stretching exercises using an exercise ball and step - Salonpas patches used for shoulder pain relief  Chronic obstructive pulmonary disease (copd) symptoms - COPD managed with albuterol  as needed - Anoro Ellipta not covered by insurance - Uses a breathing device similar to one used by her brother in the hospital - Able to walk to the examination room without significant shortness of breath  Restless leg syndrome - Restless leg syndrome is intermittent - New bed with lift feature and lumbar support provides benefit  Balance and fall risk - Uses a cane for balance - Some balance issues present - No recent  falls  Anticholinergic medication use - Uses dicyclomine  once in the morning - Aware of potential side effects on memory and falls  Psychosocial stressors - Improved financial situation has reduced stress         SDOH Screenings   Food Insecurity: No Food Insecurity (09/07/2024)  Housing: Low Risk  (09/07/2024)  Transportation Needs: No Transportation Needs (09/07/2024)  Utilities: Not At Risk (04/09/2024)  Alcohol Screen: Low Risk  (04/09/2024)  Depression (PHQ2-9): High Risk (08/26/2024)  Financial Resource Strain: Low Risk  (09/07/2024)  Physical Activity: Inactive (09/07/2024)  Social Connections: Moderately Isolated (09/07/2024)  Stress: Stress Concern Present (09/07/2024)  Tobacco Use: Medium Risk (09/12/2024)   --------------------------------------------------------------------------------------------------------------------------------------------- Visit Problem List with Assessment and Plan        COPD type A (HCC) Established problem Class A GOLD COPD  Improved controlled. Patient with shortness of breath . Using albuterol  to help with completing ADLs Insurance did not cover prescribed Anoro Ellipta.   Start Tiotropium (on preferred list)  Continue prn albuterol  MDI   Type 2 diabetes mellitus without complications (HCC) Established problem. Adequate glycemic control.  Pt is tolerating the current medication regiment. Continue lifestyle changes     Hypertension associated with diabetes (HCC) Established problem. Adequate blood pressure control.  No evidence of new end organ damage.  Tolerating medication without significant adverse effects.  Plan to continue current blood pressure medication regiment.     Chronic pain syndrome Established problem Controlled and allowing pt ability to complete ADLs and iADLs in efficient fashion.  No significant ADE identifies. No aberrant behaviors demostrated [name] reports having Narcan in her cabinet.  She is  uncertain how old the device is.    Prescribed Narcan nasal given her high MMDE and concurrent use of benzodiazepine.  Continue opioid analgesic current therapy regiment.   Irritable bowel syndrome Established problem. Stable. Patient is at goal of diarrheal and bloating control. No signs of complications, medication side effects, or red flags. Continue current medications and other regiments.  - Uses dicyclomine  once in the morning - Aware of potential side effects on memory and falls

## 2024-09-12 NOTE — Assessment & Plan Note (Signed)
 Established problem. Adequate glycemic control.  Pt is tolerating the current medication regiment. Continue lifestyle changes

## 2024-09-12 NOTE — Assessment & Plan Note (Addendum)
 Established problem Controlled and allowing pt ability to complete ADLs and iADLs in efficient fashion.  No significant ADE identifies. No aberrant behaviors demostrated [name] reports having Narcan in her cabinet.  She is uncertain how old the device is.    Prescribed Narcan nasal given her high MMDE and concurrent use of benzodiazepine.  Continue opioid analgesic current therapy regiment.

## 2024-09-13 ENCOUNTER — Other Ambulatory Visit (HOSPITAL_COMMUNITY): Payer: Self-pay

## 2024-09-18 ENCOUNTER — Other Ambulatory Visit: Payer: Self-pay | Admitting: Family Medicine

## 2024-09-18 DIAGNOSIS — J439 Emphysema, unspecified: Secondary | ICD-10-CM

## 2024-09-29 ENCOUNTER — Ambulatory Visit

## 2024-09-29 ENCOUNTER — Telehealth: Payer: Self-pay

## 2024-09-29 VITALS — Ht 64.0 in | Wt 263.0 lb

## 2024-09-29 DIAGNOSIS — Z0000 Encounter for general adult medical examination without abnormal findings: Secondary | ICD-10-CM

## 2024-09-29 NOTE — Progress Notes (Addendum)
 I connected with  [name] on 09/29/24 by a audio enabled telemedicine application and verified that I am speaking with the correct person using two identifiers.  Patient Location: Home  Provider Location: Home Office  Persons Participating in Visit: Patient.  I discussed the limitations of evaluation and management by telemedicine. The patient expressed understanding and agreed to proceed.   Vital Signs: Because this visit was a virtual/telehealth visit, some criteria may be missing or patient reported. Any vitals not documented were not able to be obtained and vitals that have been documented are patient reported.     Chief Complaint  Patient presents with   Medicare Wellness    SUBSEQUENT     Subjective:   [name] is a 73 y.o. female who presents for a Medicare Annual Wellness Visit.  Allergies (verified) Diclofenac-misoprostol, Aripiprazole, Emsam [selegiline], Estradiol, Ibuprofen, Naproxen, Neurontin [gabapentin], Paroxetine, Prednisone, and Tape   History: Past Medical History:  Diagnosis Date   Adrenal adenoma, left 12/02/2020   CT AP 11/16/20:  left adrenal mass measuring approximately 2.7 cm. This is minimally increased in size from prior study and is consistent with a benign adrenal adenoma.    Adrenal nodule 04/08/2011   04/08/11 Abdominal CT showed Left adrenal nodule which is tachnically indeterminate but most likely an adenoma.  It is 1.7 cm and 42 HU on portal venous phase image. 31 HU on delayed images.   01/09/2012 Abdominal CT w/ & w/o CM: Stable benign left adrenal adenoma. No further specific follow-up is needed for this finding.     Allergic rhinitis 01/03/2007   Qualifier: Diagnosis of  By: Kivett, Whitney     Allergy    Anemia    hx of  in 20's   Anxiety    Bipolar disorder (HCC)    Carpal tunnel syndrome 02/14/2007   S/P carpel tunnel release bilaterally.     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 03/28/2018 03/28/2018   COPD WITHOUT EXACERBATION 01/03/2007   Qualifier: Diagnosis of  By: Damien Folks  emphysema   Coronary artery calcification seen on CT scan 12/03/2017   11/2017 Chest CT: Left anterior descending and right coronary atherosclerosis     Degenerative joint disease of both hips 05/23/2007   CT AP 11/27/20: Thre are degenerative changes of both hips     Diabetes mellitus without complication (HCC)    A1C 6.5 as of 05-2019- diet changes, no meds currently    DISC WITH RADICULOPATHY 01/03/2007   Qualifier: Diagnosis of  By: Damien Folks     Emphysema of lung (HCC)    Emphysema of Lung on CT 01/10/2024   Episodic tension type headache 02/29/2024   Esophageal reflux 01/03/2007   Centricity Description: GASTROESOPHAGEAL REFLUX, NO ESOPHAGITIS Qualifier: Diagnosis of  By: Damien Folks   Centricity Description: GERD Qualifier: Diagnosis of  By: Zackary MD, Weston     External hemorrhoids s/p hemorrhoidectomy 03/28/2018 03/28/2018   Facial pain 09/20/2022   Family history of breast cancer    Family history of cancer of female genital organ    Family history of colon cancer    Family history of gene mutation    BRIP1 gene mutation (c.2010dup) in daughter   Family history of lung cancer    Fibromyalgia    Fracture of bone spur of inferior portion of calcaneus 10/06/2018   Functional gastrointestinal  disorder 06/18/2008   Functional Gastrointestinal Disorder with frequent eructation and bloating sensation with acute flares.  Responsive to Compazine  and Bentyl .  Takes a few days to calm down.     GERD without esophagitis 01/03/2007   Centricity Description: GASTROESOPHAGEAL REFLUX, NO ESOPHAGITIS Qualifier: Diagnosis of  By: Damien Folks   Centricity Description: GERD Qualifier: Diagnosis of  By: Zackary MD, Weston     Hearing impairment 04/28/2021   Heel spur, fracture, left 04/17/2018    HEMORRHOIDS, NOS 01/03/2007   Qualifier: Diagnosis of  By: Damien Folks     Hepatic steatosis 04/12/2011   Herpes simplex labialis 05/11/2011   Hip osteoarthritis 05/23/2007   MRI Pelvis (03/13/07) Bilateral osteoarthritis of the hips, left greater than right.  The  patient has a slightly more prominent hip effusion on the left than  the right.  No other significant abnormality.     History of MRSA infection 04/28/2011   HYPERCHOLESTEROLEMIA 12/24/2008   Hypertension    HYPERTENSION, BENIGN SYSTEMIC 01/03/2007   Qualifier: Diagnosis of  By: Damien Folks     HYPERTRIGLYCERIDEMIA 11/11/2007   Qualifier: Diagnosis of  By: McDiarmid MD, Todd     Hypokalemia 12/12/2017   Hypothyroidism 02/15/2007   Qualifier: Diagnosis of  By: McDiarmid MD, Krystal BAND, BORDERLINE 02/15/2007   Insomnia 03/03/2021   Intra-abdominal adhesions    Irritable bowel syndrome 01/03/2007   Qualifier: Diagnosis of  By: Damien Folks     Knee pain, acute 12/01/2022   LACTOSE INTOLERANCE 03/10/2008   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   Leukoplakia of oral cavity 09/20/2022   Major depressive disorder, recurrent episode 01/03/2007   Qualifier: Diagnosis of  By: Kivett, Whitney     Medication management 12/14/2016   Managing topiramate  for weight loss starting 12/14/16   Memory difficulties 03/04/2021   Metabolic syndrome 02/29/2012   Monoallelic mutation of BRIP1 gene 01/17/2021   c.2010dup ([id number]*)   NAFL (nonalcoholic fatty liver) 04/12/2011   Mild hepatic Steatosis on CT 04/08/11 for abdominal pain.      No impairment of memory 04/29/2021   Obesity (BMI 30.0-34.9) 09/30/2007   Has lost 55 lbs since easter. Goal of <190 by 08/05/12.      Obesity, Class III, BMI 40-49.9 (morbid obesity) (HCC) 09/30/2007        Osteoarthritis of spine 01/03/2007   MR I Lumbar (08/03): GeneralDDD; L2-3 Large  HNP.  Mod pos  hnp -  12/14/2005, smhnpw/irritL4root - 12/14/2005     OSTEOARTHRITIS, HANDS, BILATERAL 06/18/2008   Qualifier: Diagnosis of  By: McDiarmid MD, Krystal BEAL FEMORAL STRESS SYNDROME 01/03/2007   Qualifier: Diagnosis of  By: Damien Folks     Periodic limb movements of sleep 12/05/2010   Diagnosed on Polysomnography testing Fall 2011.      Pneumonia    walking pneumonia in the 90's   Pre-diabetes 12/25/2008   Qualifier: Diagnosis of  By: McDiarmid MD, Todd     Prolapsed internal hemorrhoids, grade 3, s/p ligation/pexy/hemorrhoidectomy 03/28/2018 03/28/2018   Pulmonary nodules/lesions, multiple 03/02/2023   Pyriformis syndrome, right 03/02/2023   RESTLESS LEGS SYNDROME 09/11/2007   Polysomnography (10/2010, Dr Quita Salt, interpreter. ) showed periodic limb movements that frequently awoke patient from sleep with arousal index of 4 per hour. Dr Francis Dresser (Sleep Specialist) thought her RLS was the major contributor to patient poor sleep condition, more than any sleep apnea.  He recommended  considering opamine agonist (either requip or mirapex) to see if this will help.      Rosacea, acne 10/24/2011   SIGMOID POLYP 01/23/2008   Solitary lung nodule 04/08/2011   Stage 1 mild COPD by GOLD classification (HCC) 01/03/2007   11/28/2017 Chest CT: Mild centrilobular and paraseptal emphysema with mild diffuse bronchial wall thickening     Stress incontinence    Synovial cyst of lumbar facet joint 02/17/2016   Trigger thumb of left hand 08/16/2018   Type 2 diabetes mellitus with obesity 06/30/2019   IMO SNOMED Dx Update Oct 2024     Urticaria, chronic 05/22/2011   VITAMIN D  DEFICIENCY 03/03/2010   Work-related condition 05/17/2018   Past Surgical History:  Procedure Laterality Date   APPENDECTOMY     bladder tack  1998   Dr Watt (urology)   BRONCHIAL BIOPSY  03/24/2024   Procedure: BRONCHOSCOPY, WITH BIOPSY;  Surgeon: Shelah Lamar RAMAN, MD;  Location: [facility] ENDOSCOPY;  Service: Pulmonary;;   BRONCHIAL  BRUSHINGS  03/24/2024   Procedure: BRONCHOSCOPY, WITH BRUSH BIOPSY;  Surgeon: Shelah Lamar RAMAN, MD;  Location: [facility] ENDOSCOPY;  Service: Pulmonary;;   BRONCHIAL NEEDLE ASPIRATION BIOPSY  03/24/2024   Procedure: BRONCHOSCOPY, WITH NEEDLE ASPIRATION BIOPSY;  Surgeon: Shelah Lamar RAMAN, MD;  Location: [facility] ENDOSCOPY;  Service: Pulmonary;;   BRONCHOSCOPY, WITH BIOPSY USING ELECTROMAGNETIC NAVIGATION Bilateral 03/24/2024   Procedure: BRONCHOSCOPY, WITH BIOPSY USING ELECTROMAGNETIC NAVIGATION;  Surgeon: Shelah Lamar RAMAN, MD;  Location: [facility] ENDOSCOPY;  Service: Pulmonary;  Laterality: Bilateral;  with Fluoro   BUNIONECTOMY  1992   bilateral feet with pins in great toes   CARDIAC CATHETERIZATION     CARPAL TUNNEL RELEASE  2010   Bilateral, Dr Lamar Sypher   COLONOSCOPY  2004   Dr DOROTHA Sous (GI)   CYSTOCELE REPAIR  1998    Dr Evangeline (GYN)-   ESOPHAGOGASTRODUODENOSCOPY     EVALUATION UNDER ANESTHESIA WITH HEMORRHOIDECTOMY N/A 03/28/2018   Procedure: ANORECTAL EXAM UNDER ANESTHESIA lateral internal partial sphincterotomy, hemorrhoidal ligation pixie, HEMORRHOIDECTOMY;  Surgeon: Sheldon Standing, MD;  Location: [facility] ORS;  Service: General;  Laterality: N/A;   EXAM ANORECTAL W/ ULTRASOUND     Dr. Sheldon 03-28-18    EXPLORATORY LAPAROTOMY     FACIAL LACERATIONS REPAIR     Facial Laceration repair as adolescent    FUDUCIAL PLACEMENT  03/24/2024   Procedure: INSERTION, FIDUCIAL MARKER, GOLD;  Surgeon: Shelah Lamar RAMAN, MD;  Location: [facility] ENDOSCOPY;  Service: Pulmonary;;   INJECTION HIP INTRA ARTICULAR  2008   Left Hip fluoroscopically-guided corticosteorid injection for painful osteoarthritis with good effect (06/2007)   LAPAROSCOPIC LYSIS OF ADHESIONS  05/19/2021   Procedure: LAPAROSCOPIC LYSIS OF ADHESIONS; BIOPSY ABDOMINAL SKIN LESION;  Surgeon: Viktoria Comer SAUNDERS, MD;  Location: [facility] ORS;  Service: Gynecology;;   LAPAROSCOPY FOR ECTOPIC PREGNANCY     LUMBAR LAMINECTOMY/DECOMPRESSION MICRODISCECTOMY N/A 06/21/2016    Procedure: MICRO LUMBAR DECOMPRESSION L4-L5 AND REMOVAL OF SYNOVIAL CYST    1 LEVEL;  Surgeon: Reyes Billing, MD;  Location: [facility] ORS;  Service: Orthopedics;  Laterality: N/A;   MRSA     NM MYOVIEW  LTD  2011   Dr Victory Hernandez, III (Card)   RECTOCELE REPAIR  1998   Dr Evangeline (GYN)   ROBOTIC ASSISTED SALPINGO OOPHERECTOMY N/A 05/19/2021   Procedure: XI ROBOTIC ASSISTED LEFT SALPINGO- OOPHORECTOMY WITH PELVIC WASHINGS;  Surgeon: Viktoria Comer SAUNDERS, MD;  Location: [facility] ORS;  Service: Gynecology;  Laterality: N/A;   SPHINCTEROTOMY N/A 03/28/2018   Procedure: ANAL  SPHINCTEROTOMY;  Surgeon: Sheldon Standing, MD;  Location: [facility] ORS;  Service: General;  Laterality: N/A;   UPPER GASTROINTESTINAL ENDOSCOPY     Family History  Problem Relation Age of Onset   Hypertension Mother    Frontotemporal dementia Mother    Diabetes Father    Heart disease Father    Hypertension Sister    Obesity Sister    Hypertension Daughter    Bipolar disorder Daughter    Hypertension Daughter    Breast cancer Daughter 61   Cancer Daughter 67       vulvar cancer   Other Daughter        BRIP1 gene mutation c.2010dup   Thyroid  disease Daughter    Diabetes Maternal Aunt    Lung cancer Paternal Aunt        d. >50   Cancer Paternal Aunt        unknown type, dx >50   Heart Problems Paternal Grandmother    Diabetes Paternal Grandfather    Cancer Cousin 17       gynecologic cancer NOS (paternal first cousin)   Kidney disease Brother    Heart disease Brother    Hypertension Brother    Hypertension Son    Hypertension Son    Colon polyps Son 63   Cancer Niece 68       gynecologic cancer NOS    Cancer Paternal Great-grandmother        abdominal cancer NOS   Diabetes type II Other    Hypertension Other    Heart attack Other    Alcohol abuse Other    Depression Other    Asthma Other    Migraines Other    Esophageal cancer Neg Hx    Stomach cancer Neg Hx    Rectal cancer Neg Hx    Colon cancer Neg Hx    Prostate  cancer Neg Hx    Pancreatic cancer Neg Hx    Social History   Occupational History   Occupation: disability   Occupation: [place]  [employer], [employer]: [employer]  Tobacco Use   Smoking status: Former    Current packs/day: 0.00    Average packs/day: 1.5 packs/day for 44.0 years (66.0 ttl pk-yrs)    Types: Cigarettes    Start date: 04/06/1964    Quit date: 04/06/2008    Years since quitting: 16.4    Passive exposure: Past   Smokeless tobacco: Never  Vaping Use   Vaping status: Never Used  Substance and Sexual Activity   Alcohol use: No   Drug use: Not Currently    Types: Marijuana   Sexual activity: Not Currently   Tobacco Counseling Counseling given: Not Answered  SDOH Screenings   Food Insecurity: No Food Insecurity (09/29/2024)  Housing: Low Risk  (09/29/2024)  Transportation Needs: No Transportation Needs (09/29/2024)  Utilities: Not At Risk (09/29/2024)  Alcohol Screen: Low Risk  (09/29/2024)  Depression (PHQ2-9): High Risk (08/26/2024)  Financial Resource Strain: Low Risk  (09/29/2024)  Physical Activity: Inactive (09/29/2024)  Social Connections: Moderately Isolated (09/29/2024)  Stress: Stress Concern Present (09/29/2024)  Tobacco Use: Medium Risk (09/29/2024)  Health Literacy: Adequate Health Literacy (09/29/2024)   See flowsheets for full screening details  Depression Screen Depression Screening Exception Documentation Depression Screening Exception:: Patient refusal  PHQ 2 & 9 Depression Scale- Over the past 2 weeks, how often have you been bothered by any of the following problems? Little interest or pleasure in doing things: 2 Feeling down, depressed, or  hopeless (PHQ Adolescent also includes...irritable): 0 PHQ-2 Total Score: 2 Trouble falling or staying asleep, or sleeping too much: 2 Feeling tired or having little energy: 3 Poor appetite or overeating (PHQ Adolescent also includes...weight loss): 2 Feeling bad about yourself - or that you are a  failure or have let yourself or your family down: 1 Trouble concentrating on things, such as reading the newspaper or watching television (PHQ Adolescent also includes...like school work): 3 Moving or speaking so slowly that other people could have noticed. Or the opposite - being so fidgety or restless that you have been moving around a lot more than usual: 0 Thoughts that you would be better off dead, or of hurting yourself in some way: 0 PHQ-9 Total Score: 13 If you checked off any problems, how difficult have these problems made it for you to do your work, take care of things at home, or get along with other people?: Very difficult    Patient stated goals: To enjoy life with her family, especially her grandkids.  Goals Addressed   None    Visit info / Clinical Intake: Medicare Wellness Visit Type:: Subsequent Annual Wellness Visit Persons participating in visit:: patient Medicare Wellness Visit Mode:: Telephone If telephone:: video declined Because this visit was a virtual/telehealth visit:: pt reported vitals If Telephone or Video please confirm:: I connected with the patient using audio enabled telemedicine application and verified that I am speaking with the correct person using two identifiers; I discussed the limitations of evaluation and management by telemedicine; The patient expressed understanding and agreed to proceed Patient Location:: HOME Provider Location:: HOME OFFICE Information given by:: patient Interpreter Needed?: No Pre-visit prep was completed: yes AWV questionnaire completed by patient prior to visit?: yes Date:: 09/26/24 Living arrangements:: with family/others (DAUGHTER LIVES WITH HER) Patient's Overall Health Status Rating: (!) fair Typical amount of pain: (!) a lot Does pain affect daily life?: (!) yes Are you currently prescribed opioids?: (!) yes  Dietary Habits and Nutritional Risks How many meals a day?: (!) 1 (ALOT OF SNACKING DURING THE  NIGHT) Eats fruit and vegetables daily?: (!) no Most meals are obtained by: eating out; having others provide food (DAUGHTER COOKS) In the last 2 weeks, have you had any of the following?: unintentional weight gain Diabetic:: (!) yes Any non-healing wounds?: no How often do you check your BS?: continuous glucose monitor (FREESTYLE LIBRE) Would you like to be referred to a Nutritionist or for Diabetic Management? : no  Functional Status Activities of Daily Living (to include ambulation/medication): (!) Needs Assist Feeding: Independent Dressing/Grooming: Independent Bathing: Independent Toileting: Independent Transfer: Independent Ambulation: Independent with device- listed below Home Assistive Devices/Equipment: Shower/tub chair; Eyeglasses; Cane; Elevated toliet seat Medication Administration: Independent Home Management: Needs assistance (comment) Manage your own finances?: yes Primary transportation is: driving Concerns about vision?: no *vision screening is required for WTM* Concerns about hearing?: no  Fall Screening Falls in the past year?: 1 Number of falls in past year: 0 (SLIPPED ON SLEEPING BAG) Was there an injury with Fall?: 0 Fall Risk Category Calculator: 1 Patient Fall Risk Level: Low Fall Risk  Fall Risk Patient at Risk for Falls Due to: Medication side effect; Impaired balance/gait; Orthopedic patient Fall risk Follow up: Falls evaluation completed; Education provided  Home and Transportation Safety: All rugs have non-skid backing?: N/A, no rugs All stairs or steps have railings?: N/A, no stairs Grab bars in the bathtub or shower?: yes Have non-skid surface in bathtub or shower?: yes Good home  lighting?: yes Regular seat belt use?: yes Hospital stays in the last year:: no  Cognitive Assessment Difficulty concentrating, remembering, or making decisions? : no Will 6CIT or Mini Cog be Completed: no 6CIT or Mini Cog Declined: patient alert, oriented, able  to answer questions appropriately and recall recent events  Advance Directives (For Healthcare) Does Patient Have a Medical Advance Directive?: Yes Does patient want to make changes to medical advance directive?: No - Patient declined Type of Advance Directive: Healthcare Power of [place]; Living will Copy of Healthcare Power of Attorney in Chart?: Yes - validated most recent copy scanned in chart (See row information) Copy of Living Will in Chart?: Yes - validated most recent copy scanned in chart (See row information) Would patient like information on creating a medical advance directive?: No - Patient declined  Reviewed/Updated  Reviewed/Updated: Reviewed All (Medical, Surgical, Family, Medications, Allergies, Care Teams, Patient Goals)        Objective:    Today's Vitals   09/29/24 1342  Weight: 263 lb (119.3 kg)  Height: 5' 4 (1.626 m)  PainSc: 3   PainLoc: Generalized   Body mass index is 45.14 kg/m.  Current Medications (verified) Outpatient Encounter Medications as of 09/29/2024  Medication Sig   albuterol  (VENTOLIN  HFA) 108 (90 Base) MCG/ACT inhaler INHALE 2 PUFFS INTO THE LUNGS EVERY 4 HOURS AS NEEDED FOR WHEEZING OR SHORTNESS OF BREATH.   atorvastatin  (LIPITOR) 20 MG tablet TAKE 1 TABLET BY MOUTH EVERY DAY   buPROPion (ZYBAN) 150 MG 12 hr tablet Take 150 mg by mouth 2 (two) times daily.   Calcium  Carb-Cholecalciferol (CALCIUM -VITAMIN D ) 600-400 MG-UNIT TABS Take 1 tablet by mouth daily.   citalopram  (CELEXA ) 20 MG tablet Take 20 mg by mouth at bedtime.   Continuous Glucose Sensor (FREESTYLE LIBRE 14 DAY SENSOR) MISC Inject 1 Device into the skin every 14 (fourteen) days. APPLY EVERY 14 DAYS   dicyclomine  (BENTYL ) 20 MG tablet Take 1 tablet (20 mg total) by mouth every 8 (eight) hours as needed for spasms   fexofenadine  (ALLEGRA ) 180 MG tablet Take 180 mg by mouth daily.   fluticasone (FLONASE) 50 MCG/ACT nasal spray Place 2 sprays into both nostrils daily.    hydrochlorothiazide  (HYDRODIURIL ) 25 MG tablet TAKE 1 TABLET BY MOUTH EVERY DAY   HYDROcodone -acetaminophen  (NORCO) 10-325 MG tablet Take 1 tablet by mouth every 6 (six) hours as needed for moderate pain (pain score 4-6).   KLOR-CON  M10 10 MEQ tablet TAKE 4 TABLETS (40 MEQ TOTAL) BY MOUTH AT BEDTIME.   levothyroxine  (SYNTHROID ) 100 MCG tablet Take 1 tablet (100 mcg total) by mouth every morning. 30 minutes before food   LORazepam  (ATIVAN ) 0.5 MG tablet Take 0.5 mg by mouth 3 (three) times daily as needed.   metoprolol  tartrate (LOPRESSOR ) 50 MG tablet TAKE 1 TABLET BY MOUTH TWICE A DAY   morphine  (MS CONTIN ) 15 MG 12 hr tablet Take 1 tablet (15 mg total) by mouth every 12 (twelve) hours.   Multiple Vitamins-Minerals (MULTIVITAMIN WITH MINERALS) tablet Take 1 tablet by mouth daily.   naloxone  (NARCAN ) nasal spray 4 mg/0.1 mL ! Spray into one nostril one time as needed for excess sleepiness, decreased breathing, limp body.   omeprazole  (PRILOSEC) 40 MG capsule TAKE 1 CAPSULE BY MOUTH EVERY DAY   polyethylene glycol powder (GLYCOLAX /MIRALAX ) 17 GM/SCOOP powder Take 17 g by mouth as needed for mild constipation.   prazosin  (MINIPRESS ) 2 MG capsule Take 2 mg by mouth at bedtime.   Probiotic Product (PROBIOTIC  DAILY PO) Take 1 capsule by mouth daily.   promethazine  (PHENERGAN ) 25 MG tablet Take 0.5 tablets (12.5 mg total) by mouth every 8 (eight) hours as needed for nausea or vomiting.   ramipril  (ALTACE ) 10 MG capsule TAKE 1 CAPSULE BY MOUTH EVERY DAY   senna-docusate (SENOKOT-S) 8.6-50 MG tablet Take 2 tablets by mouth at bedtime. For AFTER surgery, do not take if having diarrhea   solifenacin  (VESICARE ) 5 MG tablet TAKE 1 TABLET (5 MG TOTAL) BY MOUTH DAILY.   Tiotropium Bromide -Olodaterol 2.5-2.5 MCG/ACT AERS Inhale 2 puffs into the lungs daily.   tiZANidine  (ZANAFLEX ) 2 MG tablet Take 2 mg by mouth once.   traZODone  (DESYREL ) 100 MG tablet Take 200 mg by mouth at bedtime.   vitamin B-12  (CYANOCOBALAMIN ) 1000 MCG tablet Take 1,000 mcg by mouth daily.   No facility-administered encounter medications on file as of 09/29/2024.   Hearing/Vision screen No results found. Immunizations and Health Maintenance Health Maintenance  Topic Date Due   DTaP/Tdap/Td (3 - Td or Tdap) 10/21/2023   COVID-19 Vaccine (11 - Pfizer risk 2025-26 season) 02/24/2025   Diabetic kidney evaluation - Urine ACR  02/27/2025   HEMOGLOBIN A1C  03/11/2025   OPHTHALMOLOGY EXAM  04/09/2025   Diabetic kidney evaluation - eGFR measurement  08/07/2025   Medicare Annual Wellness (AWV)  09/29/2025   Mammogram  06/05/2026   Pneumococcal Vaccine: 50+ Years  Completed   Influenza Vaccine  Completed   Bone Density Scan  Completed   Hepatitis C Screening  Completed   Zoster Vaccines- Shingrix  Completed   Meningococcal B Vaccine  Aged Out   Lung Cancer Screening  Discontinued   Colonoscopy  Discontinued        Assessment/Plan:  This is a routine wellness examination for [name].  Patient Care Team: McDiarmid, Krystal BIRCH, MD as PCP - General Gaston Hamilton, MD as Consulting Physician (Urology) Jess Roby Masker, MD (Psychiatry) Prentis Duwaine BROCKS, RN as Oncology Nurse Navigator  I have personally reviewed and noted the following in the patient's chart:   Medical and social history Use of alcohol, tobacco or illicit drugs  Current medications and supplements including opioid prescriptions. Functional ability and status Nutritional status Physical activity Advanced directives List of other physicians Hospitalizations, surgeries, and ER visits in previous 12 months Vitals Screenings to include cognitive, depression, and falls Referrals and appointments  No orders of the defined types were placed in this encounter.  In addition, I have reviewed and discussed with patient certain preventive protocols, quality metrics, and best practice recommendations. A written personalized care plan for  preventive services as well as general preventive health recommendations were provided to patient.   Gabriella LOISE Fuller, LPN   [id number]   Return in 1 year (on 09/29/2025).  After Visit Summary: (MyChart) Due to this being a telephonic visit, the after visit summary with patients personalized plan was offered to patient via MyChart   Nurse Notes: Patient is caught up on all care gaps except Tdap vaccines.

## 2024-09-29 NOTE — Patient Instructions (Signed)
 [name],  Thank you for taking the time for your Medicare Wellness Visit. I appreciate your continued commitment to your health goals. Please review the care plan we discussed, and feel free to reach out if I can assist you further.  Please note that Annual Wellness Visits do not include a physical exam. Some assessments may be limited, especially if the visit was conducted virtually. If needed, we may recommend an in-person follow-up with your provider.  Ongoing Care Seeing your primary care provider every 3 to 6 months helps us  monitor your health and provide consistent, personalized care.   Referrals If a referral was made during today's visit and you haven't received any updates within two weeks, please contact the referred provider directly to check on the status.  Recommended Screenings:  Health Maintenance  Topic Date Due   DTaP/Tdap/Td vaccine (3 - Td or Tdap) 10/21/2023   Medicare Annual Wellness Visit  03/25/2024   COVID-19 Vaccine (11 - Pfizer risk 2025-26 season) 02/24/2025   Yearly kidney health urinalysis for diabetes  02/27/2025   Hemoglobin A1C  03/11/2025   Eye exam for diabetics  04/09/2025   Yearly kidney function blood test for diabetes  08/07/2025   Breast Cancer Screening  06/05/2026   Pneumococcal Vaccine for age over 69  Completed   Flu Shot  Completed   Osteoporosis screening with Bone Density Scan  Completed   Hepatitis C Screening  Completed   Zoster (Shingles) Vaccine  Completed   Meningitis B Vaccine  Aged Out   Screening for Lung Cancer  Discontinued   Colon Cancer Screening  Discontinued       09/29/2024    1:48 PM  Advanced Directives  Does Patient Have a Medical Advance Directive? Yes  Type of Estate Agent of [place];Living will  Does patient want to make changes to medical advance directive? No - Patient declined  Copy of Healthcare Power of Attorney in Chart? Yes - validated most recent copy scanned in chart (See row  information)  Would patient like information on creating a medical advance directive? No - Patient declined    Vision: Annual vision screenings are recommended for early detection of glaucoma, cataracts, and diabetic retinopathy. These exams can also reveal signs of chronic conditions such as diabetes and high blood pressure.  Dental: Annual dental screenings help detect early signs of oral cancer, gum disease, and other conditions linked to overall health, including heart disease and diabetes.  Please see the attached documents for additional preventive care recommendations.

## 2024-09-29 NOTE — Telephone Encounter (Signed)
 Patient stated during AWV that she is having more urine leakage and accidents.  Patient is requesting if it is possible to increase the Vesicare , so that she doesn't have to use up her incontinence pads.  Charlee Squibb N. [place], LPN [employer]

## 2024-10-06 ENCOUNTER — Encounter: Payer: Self-pay | Admitting: Family Medicine

## 2024-10-06 DIAGNOSIS — J439 Emphysema, unspecified: Secondary | ICD-10-CM

## 2024-10-07 ENCOUNTER — Other Ambulatory Visit: Payer: Self-pay

## 2024-10-07 ENCOUNTER — Other Ambulatory Visit: Payer: Self-pay | Admitting: Family Medicine

## 2024-10-07 DIAGNOSIS — K529 Noninfective gastroenteritis and colitis, unspecified: Secondary | ICD-10-CM

## 2024-10-07 DIAGNOSIS — K580 Irritable bowel syndrome with diarrhea: Secondary | ICD-10-CM

## 2024-10-07 MED ORDER — AZITHROMYCIN 250 MG PO TABS: ORAL_TABLET | ORAL | 0 refills | Status: DC

## 2024-10-07 MED ORDER — DICYCLOMINE HCL 20 MG PO TABS
20.0000 mg | ORAL_TABLET | Freq: Three times a day (TID) | ORAL | 0 refills | Status: AC | PRN
  Filled 2024-10-07: qty 90, 30d supply, fill #0

## 2024-10-12 ENCOUNTER — Other Ambulatory Visit: Payer: Self-pay | Admitting: Family Medicine

## 2024-10-12 DIAGNOSIS — M797 Fibromyalgia: Secondary | ICD-10-CM

## 2024-10-13 ENCOUNTER — Other Ambulatory Visit (HOSPITAL_COMMUNITY): Payer: Self-pay

## 2024-10-13 ENCOUNTER — Encounter: Payer: Self-pay | Admitting: Family Medicine

## 2024-10-13 ENCOUNTER — Other Ambulatory Visit: Payer: Self-pay

## 2024-10-13 DIAGNOSIS — G894 Chronic pain syndrome: Secondary | ICD-10-CM

## 2024-10-13 DIAGNOSIS — M47896 Other spondylosis, lumbar region: Secondary | ICD-10-CM

## 2024-10-13 DIAGNOSIS — G2581 Restless legs syndrome: Secondary | ICD-10-CM

## 2024-10-13 DIAGNOSIS — M48061 Spinal stenosis, lumbar region without neurogenic claudication: Secondary | ICD-10-CM

## 2024-10-13 DIAGNOSIS — M160 Bilateral primary osteoarthritis of hip: Secondary | ICD-10-CM

## 2024-10-13 MED ORDER — HYDROCODONE-ACETAMINOPHEN 10-325 MG PO TABS
1.0000 | ORAL_TABLET | Freq: Four times a day (QID) | ORAL | 0 refills | Status: DC | PRN
  Filled 2024-10-13: qty 52, 13d supply, fill #0

## 2024-10-13 MED ORDER — MORPHINE SULFATE ER 15 MG PO TBCR
15.0000 mg | EXTENDED_RELEASE_TABLET | Freq: Two times a day (BID) | ORAL | 0 refills | Status: DC
  Filled 2024-10-13: qty 60, 30d supply, fill #0

## 2024-10-14 ENCOUNTER — Other Ambulatory Visit (HOSPITAL_COMMUNITY): Payer: Self-pay

## 2024-10-21 ENCOUNTER — Other Ambulatory Visit: Payer: Self-pay | Admitting: Family Medicine

## 2024-10-21 DIAGNOSIS — I10 Essential (primary) hypertension: Secondary | ICD-10-CM

## 2024-11-07 ENCOUNTER — Other Ambulatory Visit: Payer: Self-pay

## 2024-11-08 ENCOUNTER — Other Ambulatory Visit (HOSPITAL_COMMUNITY): Payer: Self-pay

## 2024-11-09 ENCOUNTER — Other Ambulatory Visit: Payer: Self-pay | Admitting: Family Medicine

## 2024-11-09 DIAGNOSIS — E1169 Type 2 diabetes mellitus with other specified complication: Secondary | ICD-10-CM

## 2024-11-09 DIAGNOSIS — K219 Gastro-esophageal reflux disease without esophagitis: Secondary | ICD-10-CM

## 2024-11-11 ENCOUNTER — Other Ambulatory Visit (HOSPITAL_COMMUNITY): Payer: Self-pay

## 2024-11-12 ENCOUNTER — Encounter: Payer: Self-pay | Admitting: Family Medicine

## 2024-11-12 ENCOUNTER — Other Ambulatory Visit: Payer: Self-pay

## 2024-11-12 ENCOUNTER — Other Ambulatory Visit (HOSPITAL_COMMUNITY): Payer: Self-pay

## 2024-11-12 DIAGNOSIS — G894 Chronic pain syndrome: Secondary | ICD-10-CM

## 2024-11-12 DIAGNOSIS — M160 Bilateral primary osteoarthritis of hip: Secondary | ICD-10-CM

## 2024-11-12 DIAGNOSIS — M47896 Other spondylosis, lumbar region: Secondary | ICD-10-CM

## 2024-11-12 DIAGNOSIS — G2581 Restless legs syndrome: Secondary | ICD-10-CM

## 2024-11-12 DIAGNOSIS — M48061 Spinal stenosis, lumbar region without neurogenic claudication: Secondary | ICD-10-CM

## 2024-11-12 MED ORDER — MORPHINE SULFATE ER 15 MG PO TBCR
15.0000 mg | EXTENDED_RELEASE_TABLET | Freq: Two times a day (BID) | ORAL | 0 refills | Status: DC
  Filled 2024-11-12: qty 60, 30d supply, fill #0

## 2024-11-12 MED ORDER — HYDROCODONE-ACETAMINOPHEN 10-325 MG PO TABS
1.0000 | ORAL_TABLET | Freq: Four times a day (QID) | ORAL | 0 refills | Status: DC | PRN
  Filled 2024-11-12: qty 52, 13d supply, fill #0

## 2024-11-29 ENCOUNTER — Encounter: Payer: Self-pay | Admitting: Family Medicine

## 2024-12-02 ENCOUNTER — Other Ambulatory Visit (HOSPITAL_COMMUNITY): Payer: Self-pay

## 2024-12-02 MED ORDER — LORAZEPAM 0.5 MG PO TABS
0.5000 mg | ORAL_TABLET | Freq: Three times a day (TID) | ORAL | 0 refills | Status: AC | PRN
  Filled 2024-12-02: qty 270, 90d supply, fill #0

## 2024-12-03 ENCOUNTER — Other Ambulatory Visit (HOSPITAL_COMMUNITY): Payer: Self-pay

## 2024-12-03 ENCOUNTER — Other Ambulatory Visit: Payer: Self-pay

## 2024-12-06 ENCOUNTER — Encounter: Payer: Self-pay | Admitting: Family Medicine

## 2024-12-06 DIAGNOSIS — N3946 Mixed incontinence: Secondary | ICD-10-CM

## 2024-12-10 MED ORDER — SOLIFENACIN SUCCINATE 5 MG PO TABS: 5.0000 mg | ORAL_TABLET | Freq: Two times a day (BID) | ORAL | 3 refills | Status: AC

## 2024-12-11 ENCOUNTER — Other Ambulatory Visit (HOSPITAL_COMMUNITY): Payer: Self-pay

## 2024-12-11 ENCOUNTER — Encounter: Payer: Self-pay | Admitting: Family Medicine

## 2024-12-11 DIAGNOSIS — M47896 Other spondylosis, lumbar region: Secondary | ICD-10-CM

## 2024-12-11 DIAGNOSIS — G894 Chronic pain syndrome: Secondary | ICD-10-CM

## 2024-12-11 DIAGNOSIS — M160 Bilateral primary osteoarthritis of hip: Secondary | ICD-10-CM

## 2024-12-11 DIAGNOSIS — M48061 Spinal stenosis, lumbar region without neurogenic claudication: Secondary | ICD-10-CM

## 2024-12-11 DIAGNOSIS — G2581 Restless legs syndrome: Secondary | ICD-10-CM

## 2024-12-11 MED ORDER — MORPHINE SULFATE ER 15 MG PO TBCR
15.0000 mg | EXTENDED_RELEASE_TABLET | Freq: Two times a day (BID) | ORAL | 0 refills | Status: AC
  Filled 2024-12-11: qty 60, 30d supply, fill #0

## 2024-12-11 MED ORDER — HYDROCODONE-ACETAMINOPHEN 10-325 MG PO TABS
1.0000 | ORAL_TABLET | Freq: Four times a day (QID) | ORAL | 0 refills | Status: AC | PRN
  Filled 2024-12-11: qty 52, 13d supply, fill #0

## 2024-12-12 ENCOUNTER — Other Ambulatory Visit: Payer: Self-pay

## 2024-12-25 ENCOUNTER — Ambulatory Visit: Admitting: Family Medicine

## 2025-02-03 ENCOUNTER — Other Ambulatory Visit

## 2025-02-12 ENCOUNTER — Ambulatory Visit: Admitting: Physician Assistant
